# Patient Record
Sex: Male | Born: 1950 | Race: White | Hispanic: No | Marital: Married | Smoking: Never smoker
Health system: Southern US, Community
[De-identification: ages and names within clinical notes are randomized; demographics above are authoritative.]

## PROBLEM LIST (undated history)

## (undated) ENCOUNTER — Ambulatory Visit (HOSPITAL_BASED_OUTPATIENT_CLINIC_OR_DEPARTMENT_OTHER): Admission: EM

## (undated) DIAGNOSIS — F329 Major depressive disorder, single episode, unspecified: Secondary | ICD-10-CM

## (undated) DIAGNOSIS — I10 Essential (primary) hypertension: Secondary | ICD-10-CM

## (undated) DIAGNOSIS — J45909 Unspecified asthma, uncomplicated: Secondary | ICD-10-CM

## (undated) DIAGNOSIS — F32A Depression, unspecified: Secondary | ICD-10-CM

## (undated) DIAGNOSIS — K819 Cholecystitis, unspecified: Secondary | ICD-10-CM

## (undated) DIAGNOSIS — M1990 Unspecified osteoarthritis, unspecified site: Secondary | ICD-10-CM

## (undated) DIAGNOSIS — Z950 Presence of cardiac pacemaker: Secondary | ICD-10-CM

## (undated) DIAGNOSIS — K219 Gastro-esophageal reflux disease without esophagitis: Secondary | ICD-10-CM

## (undated) DIAGNOSIS — C9110 Chronic lymphocytic leukemia of B-cell type not having achieved remission: Secondary | ICD-10-CM

## (undated) DIAGNOSIS — T7840XA Allergy, unspecified, initial encounter: Secondary | ICD-10-CM

## (undated) HISTORY — PX: HERNIA REPAIR: SHX51

## (undated) HISTORY — PX: CARPAL TUNNEL RELEASE: SHX101

## (undated) HISTORY — PX: UPPER GASTROINTESTINAL ENDOSCOPY: SHX188

## (undated) HISTORY — PX: PACEMAKER INSERTION: SHX728

## (undated) HISTORY — DX: Unspecified osteoarthritis, unspecified site: M19.90

## (undated) HISTORY — PX: OTHER SURGICAL HISTORY: SHX169

## (undated) HISTORY — PX: APPENDECTOMY: SHX54

## (undated) HISTORY — PX: COLON SURGERY: SHX602

## (undated) HISTORY — DX: Unspecified asthma, uncomplicated: J45.909

## (undated) HISTORY — DX: Allergy, unspecified, initial encounter: T78.40XA

## (undated) HISTORY — PX: POLYPECTOMY: SHX149

## (undated) HISTORY — PX: COLONOSCOPY: SHX174

---

## 2000-08-05 HISTORY — PX: COLON SURGERY: SHX602

## 2002-01-03 HISTORY — PX: OTHER SURGICAL HISTORY: SHX169

## 2017-01-16 ENCOUNTER — Emergency Department (HOSPITAL_COMMUNITY)
Admission: EM | Admit: 2017-01-16 | Discharge: 2017-01-16 | Disposition: A | Discharge status: Home / Self Care | Payer: Medicare Other | Source: Home / Self Care | Attending: Emergency Medicine | Admitting: Emergency Medicine

## 2017-01-16 ENCOUNTER — Encounter (HOSPITAL_COMMUNITY): Payer: Self-pay | Admitting: Emergency Medicine

## 2017-01-16 ENCOUNTER — Emergency Department (HOSPITAL_COMMUNITY): Payer: Medicare Other

## 2017-01-16 DIAGNOSIS — Z79899 Other long term (current) drug therapy: Secondary | ICD-10-CM | POA: Insufficient documentation

## 2017-01-16 DIAGNOSIS — N179 Acute kidney failure, unspecified: Secondary | ICD-10-CM

## 2017-01-16 DIAGNOSIS — R109 Unspecified abdominal pain: Secondary | ICD-10-CM

## 2017-01-16 DIAGNOSIS — R197 Diarrhea, unspecified: Secondary | ICD-10-CM

## 2017-01-16 LAB — COMPREHENSIVE METABOLIC PANEL
ALBUMIN: 3.8 g/dL (ref 3.5–5.0)
ALT: 28 U/L (ref 17–63)
AST: 35 U/L (ref 15–41)
Alkaline Phosphatase: 74 U/L (ref 38–126)
Anion gap: 11 (ref 5–15)
BUN: 40 mg/dL — AB (ref 6–20)
CHLORIDE: 101 mmol/L (ref 101–111)
CO2: 22 mmol/L (ref 22–32)
CREATININE: 1.91 mg/dL — AB (ref 0.61–1.24)
Calcium: 9.6 mg/dL (ref 8.9–10.3)
GFR calc Af Amer: 40 mL/min — ABNORMAL LOW (ref >60)
GFR calc non Af Amer: 35 mL/min — ABNORMAL LOW (ref >60)
GLUCOSE: 115 mg/dL — AB (ref 65–99)
POTASSIUM: 3.9 mmol/L (ref 3.5–5.1)
Sodium: 134 mmol/L — ABNORMAL LOW (ref 135–145)
Total Bilirubin: 0.6 mg/dL (ref 0.3–1.2)
Total Protein: 7.2 g/dL (ref 6.5–8.1)

## 2017-01-16 LAB — CBC
HEMATOCRIT: 51.4 % (ref 39.0–52.0)
Hemoglobin: 17.6 g/dL — ABNORMAL HIGH (ref 13.0–17.0)
MCH: 33.1 pg (ref 26.0–34.0)
MCHC: 34.2 g/dL (ref 30.0–36.0)
MCV: 96.6 fL (ref 78.0–100.0)
PLATELETS: 215 10*3/uL (ref 150–400)
RBC: 5.32 MIL/uL (ref 4.22–5.81)
RDW: 12.4 % (ref 11.5–15.5)
WBC: 12 10*3/uL — ABNORMAL HIGH (ref 4.0–10.5)

## 2017-01-16 LAB — URINALYSIS, ROUTINE W REFLEX MICROSCOPIC
BACTERIA UA: NONE SEEN
Bilirubin Urine: NEGATIVE
Glucose, UA: NEGATIVE mg/dL
Ketones, ur: NEGATIVE mg/dL
LEUKOCYTES UA: NEGATIVE
Nitrite: NEGATIVE
PROTEIN: 30 mg/dL — AB
Specific Gravity, Urine: 1.041 — ABNORMAL HIGH (ref 1.005–1.030)
pH: 5 (ref 5.0–8.0)

## 2017-01-16 LAB — LIPASE, BLOOD: LIPASE: 29 U/L (ref 11–51)

## 2017-01-16 MED ORDER — GLYCOPYRROLATE 0.2 MG/ML IJ SOLN
0.2000 mg | Freq: Once | INTRAMUSCULAR | Status: AC
  Administered 2017-01-16: 0.2 mg via INTRAVENOUS
  Filled 2017-01-16: qty 1

## 2017-01-16 MED ORDER — SODIUM CHLORIDE 0.9 % IV BOLUS (SEPSIS)
2000.0000 mL | Freq: Once | INTRAVENOUS | Status: AC
  Administered 2017-01-16: 2000 mL via INTRAVENOUS

## 2017-01-16 MED ORDER — HYDROCODONE-ACETAMINOPHEN 5-325 MG PO TABS: 2.0000 | ORAL_TABLET | ORAL | 0 refills | Status: DC | PRN

## 2017-01-16 MED ORDER — ONDANSETRON HCL 4 MG/2ML IJ SOLN
4.0000 mg | Freq: Once | INTRAMUSCULAR | Status: AC
  Administered 2017-01-16: 4 mg via INTRAVENOUS
  Filled 2017-01-16: qty 2

## 2017-01-16 MED ORDER — DIPHENOXYLATE-ATROPINE 2.5-0.025 MG PO TABS: 1.0000 | ORAL_TABLET | Freq: Four times a day (QID) | ORAL | 0 refills | Status: DC | PRN

## 2017-01-16 MED ORDER — MORPHINE SULFATE (PF) 4 MG/ML IV SOLN
4.0000 mg | INTRAVENOUS | Status: DC | PRN
  Filled 2017-01-16: qty 1

## 2017-01-16 MED ORDER — IOPAMIDOL (ISOVUE-300) INJECTION 61%
INTRAVENOUS | Status: AC
  Administered 2017-01-16: 75 mL
  Filled 2017-01-16: qty 75

## 2017-01-16 MED ORDER — ONDANSETRON 4 MG PO TBDP: 4.0000 mg | ORAL_TABLET | Freq: Three times a day (TID) | ORAL | 0 refills | Status: DC | PRN

## 2017-01-16 NOTE — ED Notes (Addendum)
Pt offered the PRN morphine but patient politely declined. Pt encouraged to inform RN when and if he would like to have morphine.   Pt also informed urine and stool samples are needed. Pt provided with a urinal and specimen cup.

## 2017-01-16 NOTE — ED Triage Notes (Signed)
Pt has had diarrhea and vomitting since Sunday. Pt report 12 episodes in the last 24 hours. Pt was seen at PCP yesterday and started on bentyl and lomotil with no relief. Pt also has upper abd pain. Recent weight loss of 10 lbs.

## 2017-01-16 NOTE — ED Notes (Signed)
Education provided. AVS paper were given. Pt and wife verbalized understanding, denies need for more question.

## 2017-01-16 NOTE — ED Provider Notes (Signed)
66 yo M here with n/v and profuse diarrhea. No recent ABX use. Lab work shows pre-renal AKI and significant dehydration, and pt has been given 2L NS here in ED. He is feeling improved and has requested to go home, so plan at time of sign out is to re-assess after third L, d/c if VS remain stable. CT without acute abnormality. Pt is aware of his AKI.  On re-exam, patient is alert, oriented, in NAD. He appears well and feels improved. No further n/v/d in ED. Will d/c with supportive care, outpt follow-up for Cr re-check, and good return precautions.     Duffy Bruce, MD 01/16/17 1739

## 2017-01-16 NOTE — Discharge Instructions (Signed)
You were dehydrated enough to elevate your kidney fucntion tests. This should improve with rehydrating See your Dr. Donnetta Hutching next week for reckeck of kidney labs. Push fluids.  Lomotil for diarrhea. Vicoden for cramps or diarrhea. Recheck here with worsening diarrhea, recurring vomiting, or other changes.

## 2017-01-16 NOTE — ED Provider Notes (Signed)
[facility] DEPT Provider Note   CSN: [id number] Arrival date & time: 01/16/17  1139     History   Chief Complaint Chief Complaint  Patient presents with  . Diarrhea  . Emesis    HPI Jonathon Parker is a 66 y.o. male. Patient presents with vomiting and diarrhea since Sunday,(4 days). Started with some diarrhea and abdominal cramping on Sunday and muscle aches". May have had low-grade fever day one and 2. Some vomiting yesterday but no other episodes of vomiting. He has had profuse diarrhea for 3 days. Seen by primary care him placed on Lomotil but has continued diarrhea. Feels weak and achy all over today and presents here.   No recent antibiotic use. No travel. No dysentery stools, I.e. No blood. Heme-negative nonbilious emesis. No dysuria. Continues to make urine. No ill exposures.  HPI  No past medical history on file.  There are no active problems to display for this patient.   Past Surgical History:  Procedure Laterality Date  . COLON SURGERY    . HERNIA REPAIR         Home Medications    Prior to Admission medications   Medication Sig Start Date End Date Taking? Authorizing Provider  buPROPion (WELLBUTRIN XL) 300 MG 24 hr tablet Take 300 mg by mouth at bedtime.   Yes [provider]  esomeprazole (NEXIUM) 40 MG capsule Take 40 mg by mouth daily at 12 noon.   Yes [provider]  ibuprofen (ADVIL,MOTRIN) 200 MG tablet Take 800 mg by mouth every 6 (six) hours as needed for fever or mild pain.   Yes [provider]  traZODone (DESYREL) 100 MG tablet Take 100 mg by mouth at bedtime.   Yes [provider]  diphenoxylate-atropine (LOMOTIL) 2.5-0.025 MG tablet Take 1 tablet by mouth 4 (four) times daily as needed for diarrhea or loose stools. 01/16/17   Tanna Furry, MD  HYDROcodone-acetaminophen (NORCO/VICODIN) 5-325 MG tablet Take 2 tablets by mouth every 4 (four) hours as needed. 01/16/17   Tanna Furry, MD  ondansetron (ZOFRAN ODT) 4  MG disintegrating tablet Take 1 tablet (4 mg total) by mouth every 8 (eight) hours as needed for nausea. 01/16/17   Tanna Furry, MD    Family History No family history on file.  Social History Social History  Substance Use Topics  . Smoking status: Not on file  . Smokeless tobacco: Not on file  . Alcohol use Not on file     Allergies   Patient has no known allergies.   Review of Systems Review of Systems  Constitutional: Negative for appetite change, chills, diaphoresis, fatigue and fever.  HENT: Negative for mouth sores, sore throat and trouble swallowing.   Eyes: Negative for visual disturbance.  Respiratory: Negative for cough, chest tightness, shortness of breath and wheezing.   Cardiovascular: Negative for chest pain.  Gastrointestinal: Positive for abdominal pain, diarrhea, nausea and vomiting. Negative for abdominal distention.  Endocrine: Negative for polydipsia, polyphagia and polyuria.  Genitourinary: Negative for dysuria, frequency and hematuria.  Musculoskeletal: Negative for gait problem.  Skin: Negative for color change, pallor and rash.  Neurological: Negative for dizziness, syncope, light-headedness and headaches.  Hematological: Does not bruise/bleed easily.  Psychiatric/Behavioral: Negative for behavioral problems and confusion.     Physical Exam Updated Vital Signs BP 125/84   Pulse 71   Temp 97.6 F (36.4 C) (Oral)   Resp 16   Ht 6' (1.829 m)   Wt 78.9 kg (174 lb)   SpO2  95%   BMI 23.60 kg/m   Physical Exam  Constitutional: He is oriented to person, place, and time. He appears well-developed and well-nourished. No distress.  HENT:  Head: Normocephalic.  Eyes: Conjunctivae are normal. Pupils are equal, round, and reactive to light. No scleral icterus.  Neck: Normal range of motion. Neck supple. No thyromegaly present.  Cardiovascular: Normal rate and regular rhythm.  Exam reveals no gallop and no friction rub.   No murmur  heard. Pulmonary/Chest: Effort normal and breath sounds normal. No respiratory distress. He has no wheezes. He has no rales.  Abdominal: Soft. Bowel sounds are normal. He exhibits no distension. There is no tenderness. There is no rebound.  Soft benign abdomen. Hyperactive bowel sounds. No guarding or rebound. No localized tenderness.  Musculoskeletal: Normal range of motion.  Neurological: He is alert and oriented to person, place, and time.  Skin: Skin is warm and dry. No rash noted.  Psychiatric: He has a normal mood and affect. His behavior is normal.     ED Treatments / Results  Labs (all labs ordered are listed, but only abnormal results are displayed) Labs Reviewed  COMPREHENSIVE METABOLIC PANEL - Abnormal; Notable for the following:       Result Value   Sodium 134 (*)    Glucose, Bld 115 (*)    BUN 40 (*)    Creatinine, Ser 1.91 (*)    GFR calc non Af Amer 35 (*)    GFR calc Af Amer 40 (*)    All other components within normal limits  CBC - Abnormal; Notable for the following:    WBC 12.0 (*)    Hemoglobin 17.6 (*)    All other components within normal limits  URINALYSIS, ROUTINE W REFLEX MICROSCOPIC - Abnormal; Notable for the following:    APPearance HAZY (*)    Specific Gravity, Urine 1.041 (*)    Hgb urine dipstick SMALL (*)    Protein, ur 30 (*)    Squamous Epithelial / LPF 0-5 (*)    All other components within normal limits  C DIFFICILE QUICK SCREEN W PCR REFLEX  LIPASE, BLOOD    EKG  EKG Interpretation None       Radiology Ct Abdomen Pelvis W Contrast  Result Date: 01/16/2017 CLINICAL DATA:  Nausea, vomiting and diarrhea. Prior colon surgery and hernia repair. EXAM: CT ABDOMEN AND PELVIS WITH CONTRAST TECHNIQUE: Multidetector CT imaging of the abdomen and pelvis was performed using the standard protocol following bolus administration of intravenous contrast. CONTRAST:  80mL ISOVUE-300 IOPAMIDOL (ISOVUE-300) INJECTION 61% COMPARISON:  None. FINDINGS:  Lower chest: Normal size cardiac chambers with bibasilar dependent atelectasis. Hepatobiliary: No focal liver abnormality is seen. No gallstones, gallbladder wall thickening, or biliary dilatation. Pancreas: Unremarkable. No pancreatic ductal dilatation or surrounding inflammatory changes. Spleen: Normal in size without focal abnormality. Adrenals/Urinary Tract: Adrenal glands are unremarkable. Kidneys are normal, without renal calculi, focal lesion, or hydronephrosis. Bladder is unremarkable. Stomach/Bowel: The stomach is contracted in appearance. There is normal small bowel rotation without inflammation or obstruction. Sutures are seen at the base of the cecum consistent prior appendectomy. The appendix not visualized. Move liquid stool noted within the colon which may reflect diarrheal disease. Sutures are noted along the distal rectum. No large bowel obstruction is noted. Vascular/Lymphatic: No significant vascular findings are present. No enlarged abdominal or pelvic lymph nodes. Reproductive: Prostate is unremarkable. Other: Suture material along the lower ventral pelvis are extending to the pubic symphysis from presumed prior hernia repair  possibly a sports hernia given location. Musculoskeletal: Degenerative disc disease L5-S1. No acute nor suspicious osseous lesions. IMPRESSION: 1. No bowel obstruction or acute inflammation. Liquid stool in the rectum may reflect diarrheal disease. 2. Degenerative disc disease L5-S1. Electronically Signed   By: Ashley Royalty M.D.   On: 01/16/2017 15:16    Procedures Procedures (including critical care time)  Medications Ordered in ED Medications  morphine 4 MG/ML injection 4 mg (not administered)  sodium chloride 0.9 % bolus 2,000 mL (0 mLs Intravenous Stopping Infusion hung by another clincian 01/16/17 1625)  ondansetron (ZOFRAN) injection 4 mg (4 mg Intravenous Given 01/16/17 1342)  glycopyrrolate (ROBINUL) injection 0.2 mg (0.2 mg Intravenous Given 01/16/17 1344)   iopamidol (ISOVUE-300) 61 % injection (75 mLs  Contrast Given 01/16/17 1457)     Initial Impression / Assessment and Plan / ED Course  I have reviewed the triage vital signs and the nursing notes.  Pertinent labs & imaging results that were available during my care of the patient were reviewed by me and considered in my medical decision making (see chart for details).     Mild AK I1.9. After fluids and antiemetics, Lomotil, and morphine he is feeling better. He has had no diarrhea here in 3 hours. I discussed his mild AK I with him. He is feeling better taking by mouth fluids. I think he would be appropriate for outpatient treatment with close follow-up with his primary care recheck creatinine in the next several days. We'll give him a limited number of 10 Vicodin use for cramps and pain and diarrhea. Also Zofran and Lomotil as needed. If he produces a stool while here we will collected and sent for C. Difficile, although low suspicion as patient not on  And hepatic or any recent hospitalizations.  Final Clinical Impressions(s) / ED Diagnoses   Final diagnoses:  Diarrhea, unspecified type  Acute kidney injury (HCC)    New Prescriptions New Prescriptions   DIPHENOXYLATE-ATROPINE (LOMOTIL) 2.5-0.025 MG TABLET    Take 1 tablet by mouth 4 (four) times daily as needed for diarrhea or loose stools.   HYDROCODONE-ACETAMINOPHEN (NORCO/VICODIN) 5-325 MG TABLET    Take 2 tablets by mouth every 4 (four) hours as needed.   ONDANSETRON (ZOFRAN ODT) 4 MG DISINTEGRATING TABLET    Take 1 tablet (4 mg total) by mouth every 8 (eight) hours as needed for nausea.     Tanna Furry, MD 01/16/17 [phone]

## 2017-01-16 NOTE — ED Notes (Signed)
Pt alert oriented, denies pain. Family at the bedside. Education about enteric percussion, dehydration and electrolyte imbalance were given, verbalized understanding

## 2017-01-19 ENCOUNTER — Encounter (HOSPITAL_COMMUNITY): Payer: Self-pay | Admitting: Emergency Medicine

## 2017-01-19 ENCOUNTER — Inpatient Hospital Stay (HOSPITAL_COMMUNITY)
Admission: EM | Admit: 2017-01-19 | Discharge: 2017-01-23 | DRG: 871 | Disposition: A | Discharge status: Home / Self Care | Payer: Medicare Other | Attending: Internal Medicine | Admitting: Internal Medicine

## 2017-01-19 ENCOUNTER — Emergency Department (HOSPITAL_COMMUNITY): Payer: Medicare Other

## 2017-01-19 DIAGNOSIS — A09 Infectious gastroenteritis and colitis, unspecified: Secondary | ICD-10-CM | POA: Diagnosis not present

## 2017-01-19 DIAGNOSIS — N179 Acute kidney failure, unspecified: Secondary | ICD-10-CM | POA: Diagnosis present

## 2017-01-19 DIAGNOSIS — K219 Gastro-esophageal reflux disease without esophagitis: Secondary | ICD-10-CM | POA: Diagnosis present

## 2017-01-19 DIAGNOSIS — A419 Sepsis, unspecified organism: Principal | ICD-10-CM | POA: Diagnosis present

## 2017-01-19 DIAGNOSIS — A045 Campylobacter enteritis: Secondary | ICD-10-CM | POA: Diagnosis present

## 2017-01-19 DIAGNOSIS — R945 Abnormal results of liver function studies: Secondary | ICD-10-CM

## 2017-01-19 DIAGNOSIS — D1803 Hemangioma of intra-abdominal structures: Secondary | ICD-10-CM | POA: Diagnosis present

## 2017-01-19 DIAGNOSIS — R6510 Systemic inflammatory response syndrome (SIRS) of non-infectious origin without acute organ dysfunction: Secondary | ICD-10-CM

## 2017-01-19 DIAGNOSIS — K529 Noninfective gastroenteritis and colitis, unspecified: Secondary | ICD-10-CM | POA: Diagnosis present

## 2017-01-19 DIAGNOSIS — Z9049 Acquired absence of other specified parts of digestive tract: Secondary | ICD-10-CM

## 2017-01-19 DIAGNOSIS — R7989 Other specified abnormal findings of blood chemistry: Secondary | ICD-10-CM | POA: Diagnosis present

## 2017-01-19 DIAGNOSIS — K8020 Calculus of gallbladder without cholecystitis without obstruction: Secondary | ICD-10-CM | POA: Diagnosis present

## 2017-01-19 DIAGNOSIS — Z79899 Other long term (current) drug therapy: Secondary | ICD-10-CM | POA: Diagnosis not present

## 2017-01-19 DIAGNOSIS — K859 Acute pancreatitis without necrosis or infection, unspecified: Secondary | ICD-10-CM | POA: Diagnosis present

## 2017-01-19 DIAGNOSIS — E876 Hypokalemia: Secondary | ICD-10-CM | POA: Diagnosis present

## 2017-01-19 DIAGNOSIS — B9623 Unspecified Shiga toxin-producing Escherichia coli [E. coli] (STEC) as the cause of diseases classified elsewhere: Secondary | ICD-10-CM | POA: Diagnosis present

## 2017-01-19 DIAGNOSIS — E860 Dehydration: Secondary | ICD-10-CM | POA: Diagnosis present

## 2017-01-19 DIAGNOSIS — K851 Biliary acute pancreatitis without necrosis or infection: Secondary | ICD-10-CM | POA: Diagnosis not present

## 2017-01-19 DIAGNOSIS — R748 Abnormal levels of other serum enzymes: Secondary | ICD-10-CM | POA: Diagnosis present

## 2017-01-19 DIAGNOSIS — F329 Major depressive disorder, single episode, unspecified: Secondary | ICD-10-CM | POA: Diagnosis present

## 2017-01-19 DIAGNOSIS — R109 Unspecified abdominal pain: Secondary | ICD-10-CM

## 2017-01-19 DIAGNOSIS — A498 Other bacterial infections of unspecified site: Secondary | ICD-10-CM

## 2017-01-19 DIAGNOSIS — F32A Depression, unspecified: Secondary | ICD-10-CM | POA: Diagnosis present

## 2017-01-19 DIAGNOSIS — A4151 Sepsis due to Escherichia coli [E. coli]: Secondary | ICD-10-CM | POA: Diagnosis not present

## 2017-01-19 HISTORY — DX: Depression, unspecified: F32.A

## 2017-01-19 HISTORY — DX: Major depressive disorder, single episode, unspecified: F32.9

## 2017-01-19 LAB — COMPREHENSIVE METABOLIC PANEL
ALBUMIN: 3.1 g/dL — AB (ref 3.5–5.0)
ALK PHOS: 164 U/L — AB (ref 38–126)
ALT: 54 U/L (ref 17–63)
AST: 35 U/L (ref 15–41)
Anion gap: 11 (ref 5–15)
BUN: 17 mg/dL (ref 6–20)
CALCIUM: 9.3 mg/dL (ref 8.9–10.3)
CHLORIDE: 98 mmol/L — AB (ref 101–111)
CO2: 25 mmol/L (ref 22–32)
CREATININE: 1.13 mg/dL (ref 0.61–1.24)
GFR calc Af Amer: >60 mL/min (ref >60)
GFR calc non Af Amer: >60 mL/min (ref >60)
GLUCOSE: 83 mg/dL (ref 65–99)
Potassium: 3.5 mmol/L (ref 3.5–5.1)
SODIUM: 134 mmol/L — AB (ref 135–145)
Total Bilirubin: 1.7 mg/dL — ABNORMAL HIGH (ref 0.3–1.2)
Total Protein: 6.8 g/dL (ref 6.5–8.1)

## 2017-01-19 LAB — CBC
HCT: 46.4 % (ref 39.0–52.0)
HEMATOCRIT: 43.6 % (ref 39.0–52.0)
HEMOGLOBIN: 15.4 g/dL (ref 13.0–17.0)
Hemoglobin: 15.5 g/dL (ref 13.0–17.0)
MCH: 32.1 pg (ref 26.0–34.0)
MCH: 33.6 pg (ref 26.0–34.0)
MCHC: 33.4 g/dL (ref 30.0–36.0)
MCHC: 35.3 g/dL (ref 30.0–36.0)
MCV: 96.1 fL (ref 78.0–100.0)
MCV: 95.2 fL (ref 78.0–100.0)
PLATELETS: 298 10*3/uL (ref 150–400)
Platelets: 324 10*3/uL (ref 150–400)
RBC: 4.83 MIL/uL (ref 4.22–5.81)
RBC: 4.58 MIL/uL (ref 4.22–5.81)
RDW: 12.5 % (ref 11.5–15.5)
RDW: 12.3 % (ref 11.5–15.5)
WBC: 19.5 10*3/uL — ABNORMAL HIGH (ref 4.0–10.5)
WBC: 23.1 10*3/uL — ABNORMAL HIGH (ref 4.0–10.5)

## 2017-01-19 LAB — URINALYSIS, ROUTINE W REFLEX MICROSCOPIC
BILIRUBIN URINE: NEGATIVE
Bacteria, UA: NONE SEEN
Glucose, UA: NEGATIVE mg/dL
KETONES UR: 5 mg/dL — AB
Leukocytes, UA: NEGATIVE
Nitrite: NEGATIVE
PH: 5 (ref 5.0–8.0)
Protein, ur: 100 mg/dL — AB
SPECIFIC GRAVITY, URINE: 1.027 (ref 1.005–1.030)

## 2017-01-19 LAB — CREATININE, SERUM
CREATININE: 1.12 mg/dL (ref 0.61–1.24)
GFR calc Af Amer: >60 mL/min (ref >60)

## 2017-01-19 LAB — LIPASE, BLOOD: Lipase: 84 U/L — ABNORMAL HIGH (ref 11–51)

## 2017-01-19 MED ORDER — ONDANSETRON HCL 4 MG/2ML IJ SOLN
4.0000 mg | Freq: Once | INTRAMUSCULAR | Status: AC
  Administered 2017-01-19: 4 mg via INTRAVENOUS
  Filled 2017-01-19: qty 2

## 2017-01-19 MED ORDER — SODIUM CHLORIDE 0.9 % IV BOLUS (SEPSIS)
1000.0000 mL | Freq: Once | INTRAVENOUS | Status: AC
  Administered 2017-01-19: 1000 mL via INTRAVENOUS

## 2017-01-19 MED ORDER — VANCOMYCIN 50 MG/ML ORAL SOLUTION
125.0000 mg | Freq: Four times a day (QID) | ORAL | Status: DC
  Administered 2017-01-19 – 2017-01-20 (×2): 125 mg via ORAL
  Filled 2017-01-19 (×6): qty 2.5

## 2017-01-19 MED ORDER — ENOXAPARIN SODIUM 40 MG/0.4ML ~~LOC~~ SOLN
40.0000 mg | SUBCUTANEOUS | Status: DC
  Administered 2017-01-19 – 2017-01-22 (×4): 40 mg via SUBCUTANEOUS
  Filled 2017-01-19 (×4): qty 0.4

## 2017-01-19 MED ORDER — PANTOPRAZOLE SODIUM 40 MG IV SOLR
40.0000 mg | INTRAVENOUS | Status: DC
  Administered 2017-01-19: 40 mg via INTRAVENOUS
  Filled 2017-01-19: qty 40

## 2017-01-19 MED ORDER — BUPROPION HCL ER (XL) 150 MG PO TB24
300.0000 mg | ORAL_TABLET | Freq: Every day | ORAL | Status: DC
  Administered 2017-01-20 – 2017-01-22 (×3): 300 mg via ORAL
  Filled 2017-01-19 (×3): qty 2

## 2017-01-19 MED ORDER — ONDANSETRON HCL 4 MG PO TABS: 4.0000 mg | ORAL_TABLET | Freq: Four times a day (QID) | ORAL | Status: DC | PRN

## 2017-01-19 MED ORDER — MORPHINE SULFATE (PF) 4 MG/ML IV SOLN
4.0000 mg | Freq: Once | INTRAVENOUS | Status: AC
  Administered 2017-01-19: 4 mg via INTRAVENOUS
  Filled 2017-01-19: qty 1

## 2017-01-19 MED ORDER — OXYCODONE HCL 5 MG PO TABS: 5.0000 mg | ORAL_TABLET | ORAL | Status: DC | PRN

## 2017-01-19 MED ORDER — ACETAMINOPHEN 325 MG PO TABS: 650.0000 mg | ORAL_TABLET | Freq: Four times a day (QID) | ORAL | Status: DC | PRN

## 2017-01-19 MED ORDER — TRAZODONE HCL 100 MG PO TABS
100.0000 mg | ORAL_TABLET | Freq: Every day | ORAL | Status: DC
  Administered 2017-01-19 – 2017-01-22 (×4): 100 mg via ORAL
  Filled 2017-01-19 (×4): qty 1

## 2017-01-19 MED ORDER — SODIUM CHLORIDE 0.9 % IV SOLN
INTRAVENOUS | Status: DC
  Administered 2017-01-19 – 2017-01-21 (×6): via INTRAVENOUS

## 2017-01-19 MED ORDER — MORPHINE SULFATE (PF) 2 MG/ML IV SOLN: 1.0000 mg | INTRAVENOUS | Status: DC | PRN

## 2017-01-19 MED ORDER — ALBUTEROL SULFATE (2.5 MG/3ML) 0.083% IN NEBU: 2.5000 mg | INHALATION_SOLUTION | RESPIRATORY_TRACT | Status: DC | PRN

## 2017-01-19 MED ORDER — ACETAMINOPHEN 650 MG RE SUPP: 650.0000 mg | Freq: Four times a day (QID) | RECTAL | Status: DC | PRN

## 2017-01-19 MED ORDER — ONDANSETRON HCL 4 MG/2ML IJ SOLN: 4.0000 mg | Freq: Four times a day (QID) | INTRAMUSCULAR | Status: DC | PRN

## 2017-01-19 NOTE — ED Notes (Signed)
Patient transported to Ultrasound 

## 2017-01-19 NOTE — ED Provider Notes (Signed)
[facility] DEPT Provider Note   CSN: [id number] Arrival date & time: 01/19/17  1204     History   Chief Complaint Chief Complaint  Patient presents with  . Abdominal Pain  . Dehydration    HPI Jonathon Parker is a 66 y.o. male who presents today with persistent generalized abdominal pain, nausea, generalized fatigue. Patient was seen here on 01/16/17 for evaluation of abdominal pain. CT at that time was unremarkable. He did have slight bump in his BUN/creatinine was 5 follow-up with his primary care doctor for reevaluation. He was discharged home with supportive care and plans follow-up with primary care. He returns today because he feels like his symptoms have not been improving. He states that his abdominal pain has improved but is still present. He states that it is like a intermittent sharp ache and is worsened after eating. He reports that he is still very nauseous and has had decreased PO. He reports that he feels "dehydrated and just very fatigued."  He he reports that he had about 3 days of continuous diarrhea where he was having multiple episodes per day. He said it was very loose and "like green snot." The presence of blood in stool. He states that he has not had any more diarrhea since 01/17/17. He states the last time he was taking antibiotics was in March. He has not taken any since. Patient denies any dysuria, hematuria vomiting, chest pain, difficulty breathing, fever.  The history is provided by the patient.    History reviewed. No pertinent past medical history.  Patient Active Problem List   Diagnosis Date Noted  . Gastroenteritis 01/19/2017  . SIRS (systemic inflammatory response syndrome) ([place]) 01/19/2017  . GERD (gastroesophageal reflux disease) 01/19/2017  . Depression 01/19/2017    Past Surgical History:  Procedure Laterality Date  . COLON SURGERY    . HERNIA REPAIR         Home Medications    Prior to Admission medications   Medication Sig Start  Date End Date Taking? Authorizing Provider  buPROPion (WELLBUTRIN XL) 300 MG 24 hr tablet Take 300 mg by mouth at bedtime.   Yes [provider]  diphenoxylate-atropine (LOMOTIL) 2.5-0.025 MG tablet Take 1 tablet by mouth 4 (four) times daily as needed for diarrhea or loose stools. 01/16/17  Yes Tanna Furry, MD  esomeprazole (NEXIUM) 40 MG capsule Take 40 mg by mouth daily at 12 noon.   Yes [provider]  HYDROcodone-acetaminophen (NORCO/VICODIN) 5-325 MG tablet Take 2 tablets by mouth every 4 (four) hours as needed. 01/16/17  Yes Tanna Furry, MD  ibuprofen (ADVIL,MOTRIN) 200 MG tablet Take 800 mg by mouth every 6 (six) hours as needed for fever or mild pain.   Yes [provider]  ondansetron (ZOFRAN ODT) 4 MG disintegrating tablet Take 1 tablet (4 mg total) by mouth every 8 (eight) hours as needed for nausea. 01/16/17  Yes Tanna Furry, MD  traZODone (DESYREL) 100 MG tablet Take 100 mg by mouth at bedtime.   Yes [provider]    Family History No family history on file.  Social History Social History  Substance Use Topics  . Smoking status: Never Smoker  . Smokeless tobacco: Never Used  . Alcohol use Yes     Allergies   Patient has no known allergies.   Review of Systems Review of Systems  Constitutional: Positive for appetite change and fatigue. Negative for chills and fever.  Respiratory: Negative for cough and shortness of breath.  Cardiovascular: Negative for chest pain.  Gastrointestinal: Positive for abdominal pain, diarrhea (Resolved) and nausea. Negative for blood in stool and vomiting.  Genitourinary: Negative for dysuria and hematuria.  Musculoskeletal: Positive for myalgias. Negative for back pain.  Skin: Negative for rash.  Neurological: Positive for weakness (Generalized). Negative for dizziness, numbness and headaches.  All other systems reviewed and are negative.    Physical Exam Updated Vital Signs BP 119/71   Pulse 77    Temp 98.1 F (36.7 C) (Oral)   Resp 15   Ht 6' (1.829 m)   Wt 78.9 kg (174 lb)   SpO2 97%   BMI 23.60 kg/m   Physical Exam  Constitutional: He is oriented to person, place, and time. He appears well-developed and well-nourished.  Appears uncomfortable but no acute distress.  HENT:  Head: Normocephalic and atraumatic.  Mouth/Throat: Uvula is midline and oropharynx is clear and moist. Mucous membranes are dry.  Eyes: Conjunctivae, EOM and lids are normal. Pupils are equal, round, and reactive to light.  Neck: Full passive range of motion without pain.  Cardiovascular: Normal rate, regular rhythm, normal heart sounds and normal pulses.  Exam reveals no gallop and no friction rub.   No murmur heard. Pulses:      Radial pulses are 2+ on the right side, and 2+ on the left side.  Pulmonary/Chest: Effort normal and breath sounds normal.  Abdominal: Soft. Normal appearance. Bowel sounds are decreased. There is tenderness in the left upper quadrant and left lower quadrant. There is no rigidity, no guarding, no CVA tenderness and negative Murphy's sign.  Musculoskeletal: Normal range of motion.  Neurological: He is alert and oriented to person, place, and time.  Skin: Skin is warm and dry. Capillary refill takes less than 2 seconds.  Psychiatric: He has a normal mood and affect. His speech is normal.  Nursing note and vitals reviewed.    ED Treatments / Results  Labs (all labs ordered are listed, but only abnormal results are displayed) Labs Reviewed  LIPASE, BLOOD - Abnormal; Notable for the following:       Result Value   Lipase 84 (*)    All other components within normal limits  COMPREHENSIVE METABOLIC PANEL - Abnormal; Notable for the following:    Sodium 134 (*)    Chloride 98 (*)    Albumin 3.1 (*)    Alkaline Phosphatase 164 (*)    Total Bilirubin 1.7 (*)    All other components within normal limits  CBC - Abnormal; Notable for the following:    WBC 19.5 (*)    All  other components within normal limits  URINALYSIS, ROUTINE W REFLEX MICROSCOPIC - Abnormal; Notable for the following:    Color, Urine AMBER (*)    APPearance HAZY (*)    Hgb urine dipstick SMALL (*)    Ketones, ur 5 (*)    Protein, ur 100 (*)    Squamous Epithelial / LPF 0-5 (*)    All other components within normal limits    EKG  EKG Interpretation None       Radiology US Abdomen Limited Ruq  Result Date: 01/19/2017 CLINICAL DATA:  Abdominal pain. EXAM: ULTRASOUND ABDOMEN LIMITED RIGHT UPPER QUADRANT COMPARISON:  CT scans August 2012 and January 16, 2017. Ultrasounds from March 2013 and September 2016 FINDINGS: Gallbladder: Stones and sludge are seen in the gallbladder with no wall thickening, pericholecystic fluid, or Murphy's sign. Common bile duct: Diameter: 6 mm Liver: An echogenic rounded mass is  seen in the liver measuring up to 1.9 cm. No other abnormalities. IMPRESSION: 1. Stones and sludge are seen in the gallbladder with no wall thickening, pericholecystic fluid, or Murphy's sign. 2. A 1.9 cm mass is seen in the right hepatic lobe. This was not seen on previous studies. The finding is nonspecific but could represent a hemangioma. An MRI could further assess to exclude other possibilities since this abnormality was not seen previously. 3. The common bile duct is at the upper limits of normal. Recommend correlation with labs. Electronically Signed   By: Dorise Bullion III M.D   On: 01/19/2017 16:21    Procedures Procedures (including critical care time)  Medications Ordered in ED Medications  sodium chloride 0.9 % bolus 1,000 mL (not administered)  sodium chloride 0.9 % bolus 1,000 mL (1,000 mLs Intravenous New Bag/Given 01/19/17 1431)  ondansetron (ZOFRAN) injection 4 mg (4 mg Intravenous Given 01/19/17 1430)  morphine 4 MG/ML injection 4 mg (4 mg Intravenous Given 01/19/17 1501)     Initial Impression / Assessment and Plan / ED Course  I have reviewed the triage vital  signs and the nursing notes.  Pertinent labs & imaging results that were available during my care of the patient were reviewed by me and considered in my medical decision making (see chart for details).     66 yo M who presents to the Emergency Department with persistent abdominal pain, generalized fatigue and nausea. Patient recently seen here on 01/16/17 for abdominal pain. At that time CT abdomen was unremarkable for any acute abnormality. Returns today because he is having persistent symptoms. Patient also states he feels like he is dehydrated. Concern for infectious etiology versus dehydration versus worsening AKI vs pancreatitis. Labs ordered at triage, including CBC, CMP, lipase, UA. Analgesics provided in the department. IVF order for fluid resuscitation.  Labs reviewed. CBC shows elevated white blood cell count at 19.5, which is elevated from his previous one on 6/14 which was 12.0. Lipase is elevated today at 84, compared to 29 measured 2 days ago. CMP shows elevation in alkaline phosphatase and bili, which was not present in previous ED visit. AKI is improved. Given findings on labs and concern for consistent pain. Will plan to right upper quadrant ultrasound to evaluate for concerns for gallbladder induced pancreatitis. UA is pending.  Ultrasound reviewed. There is presence of stones and sludge seen in the gallbladder. The common duct is at the upper limits of normal. Given findings, concern that the pink otitis maybe biliary related. At this time, patient likely benefit from admission  for further evaluation.  Consult hospitalist place.  Discussed with hospitalist. Will plan to admit for pink otitis and further evaluation.   Final Clinical Impressions(s) / ED Diagnoses   Final diagnoses:  Abdominal pain  Acute biliary pancreatitis, unspecified complication status    New Prescriptions New Prescriptions   No medications on file     Desma Mcgregor 01/19/17 1711      Macarthur Critchley, MD 01/22/17 [phone]

## 2017-01-19 NOTE — ED Triage Notes (Signed)
Pt.omach pain with nausea and diarrhea, that has stopped, but now I think Im just dehydrated.

## 2017-01-19 NOTE — H&P (Signed)
HISTORY AND PHYSICAL       PATIENT DETAILS Name: Jonathon Parker Age: 66 y.o. Sex: male Date of Birth: [birth year] Admit Date: 01/19/2017 [id number], Lora Havens, MD   Patient coming from: [place]:  Dry heaving, diffuse abdominal pain, watery diarrhea-for 5-7 days  HPI: Jonathon Parker is a 66 y.o. male with medical history significant of GERD, depression who presented to the ED for evaluation of the above noted complaints. Per history obtained, approximately 1 week back, patient started having fever, after 2 days the fever resolved, patient then started having profuse loose watery diarrhea-up to 12 episodes during the day. He was seen in the emergency room on 6/14 for this issue, and given IV fluids, supportive care and discharged home. Unfortunately he continues to feel very fatigued, he has started to develop diffuse abdominal pain and diffuse myalgias as well. His diarrhea continues, he has had approximately 3 loose stools earlier today, but none since then. He also complains of mostly nausea and dry heaving over the past few days and has not been able to drink/eat.  Due to persistence of the above noted symptoms, he presented back to the emergency room, where he was found to have a minimally elevated lipase, a RUQ ultrasound demonstrated stones/sludge in the gallbladder, a 1.9 cm mass was also seen in the right lower lobe, per ultrasound report this is nonspecific and could represent a hemangioma.  Patient was given IV fluids and other supportive measures, and the hospitalist service was asked to admit this patient for further evaluation and treatment.  ED Course: See above.  Note: Lives at: Home Mobility: Independent Chronic Indwelling Foley:no   REVIEW OF SYSTEMS:  Constitutional:   No  weight loss, night sweats,  Fevers, chills, fatigue.  HEENT:    No headaches, Dysphagia,Tooth/dental problems,Sore throat  Cardio-vascular: No chest  pain,Orthopnea, PND,lower extremity edema, anasarca, palpitations  GI:  No heartburn  Resp: No shortness of breath, cough, hemoptysis,plueritic chest pain.   Skin:  No rash or lesions.  GU:  No dysuria, change in color of urine, no urgency or frequency.  No flank pain.  Musculoskeletal: No joint pain or swelling.  No decreased range of motion.  No back pain.  Endocrine: No heat intolerance, no cold intolerance, no polyuria, no polydipsia  Psych: No change in mood or affect. No depression or anxiety.  No memory loss.   ALLERGIES:  No Known Allergies  PAST MEDICAL HISTORY: GERD Depression  PAST SURGICAL HISTORY: Past Surgical History:  Procedure Laterality Date  . COLON SURGERY    . HERNIA REPAIR      MEDICATIONS AT HOME: Prior to Admission medications   Medication Sig Start Date End Date Taking? Authorizing Provider  buPROPion (WELLBUTRIN XL) 300 MG 24 hr tablet Take 300 mg by mouth at bedtime.   Yes [provider]  diphenoxylate-atropine (LOMOTIL) 2.5-0.025 MG tablet Take 1 tablet by mouth 4 (four) times daily as needed for diarrhea or loose stools. 01/16/17  Yes Tanna Furry, MD  esomeprazole (NEXIUM) 40 MG capsule Take 40 mg by mouth daily at 12 noon.   Yes [provider]  HYDROcodone-acetaminophen (NORCO/VICODIN) 5-325 MG tablet Take 2 tablets by mouth every 4 (four) hours as needed. 01/16/17  Yes Tanna Furry, MD  ibuprofen (ADVIL,MOTRIN) 200 MG tablet Take 800 mg by mouth every 6 (six) hours as needed for fever or mild pain.   Yes [provider]  ondansetron (ZOFRAN ODT) 4  MG disintegrating tablet Take 1 tablet (4 mg total) by mouth every 8 (eight) hours as needed for nausea. 01/16/17  Yes Tanna Furry, MD  traZODone (DESYREL) 100 MG tablet Take 100 mg by mouth at bedtime.   Yes [provider]    FAMILY HISTORY: No family history of GI malignancy  SOCIAL HISTORY:  reports that he has never smoked. He has never used smokeless  tobacco. He reports that he drinks alcohol. He reports that he does not use drugs.  PHYSICAL EXAM: Blood pressure 119/71, pulse 77, temperature 98.1 F (36.7 C), temperature source Oral, resp. rate 15, height 6' (1.829 m), weight 78.9 kg (174 lb), SpO2 97 %.  General appearance :Awake, alert, not in any distress. Speech Clear. Not toxic Looking Eyes:, pupils equally reactive to light and accomodation,no scleral icterus.Pink conjunctiva HEENT: Atraumatic and Normocephalic Neck: supple, no JVD. No cervical lymphadenopathy. No thyromegaly Resp:Good air entry bilaterally, no added sounds  CVS: S1 S2 regular, no murmurs.  GI: Bowel sounds present, nontender on my exam (but given morphine prior to my arrival). Some mild guarding but benign abdominal exam. Extremities: B/L Lower Ext shows no edema, both legs are warm to touch Neurology:  speech clear,Non focal, sensation is grossly intact. Psychiatric: Normal judgment and insight. Alert and oriented x 3. Normal mood. Musculoskeletal:gait appears to be normal.No digital cyanosis Skin:No Rash, warm and dry Wounds:N/A  LABS ON ADMISSION:  I have personally reviewed following labs and imaging studies  CBC:  Recent Labs Lab 01/16/17 1213 01/19/17 1210  WBC 12.0* 19.5*  HGB 17.6* 15.5  HCT 51.4 46.4  MCV 96.6 96.1  PLT 215 659    Basic Metabolic Panel:  Recent Labs Lab 01/16/17 1213 01/19/17 1210  NA 134* 134*  K 3.9 3.5  CL 101 98*  CO2 22 25  GLUCOSE 115* 83  BUN 40* 17  CREATININE 1.91* 1.13  CALCIUM 9.6 9.3    GFR: Estimated Creatinine Clearance: 70.6 mL/min (by C-G formula based on SCr of 1.13 mg/dL).  Liver Function Tests:  Recent Labs Lab 01/16/17 1213 01/19/17 1210  AST 35 35  ALT 28 54  ALKPHOS 74 164*  BILITOT 0.6 1.7*  PROT 7.2 6.8  ALBUMIN 3.8 3.1*    Recent Labs Lab 01/16/17 1213 01/19/17 1210  LIPASE 29 84*   No results for input(s): AMMONIA in the last 168 hours.  Coagulation Profile: No  results for input(s): INR, PROTIME in the last 168 hours.  Cardiac Enzymes: No results for input(s): CKTOTAL, CKMB, CKMBINDEX, TROPONINI in the last 168 hours.  BNP (last 3 results) No results for input(s): PROBNP in the last 8760 hours.  HbA1C: No results for input(s): HGBA1C in the last 72 hours.  CBG: No results for input(s): GLUCAP in the last 168 hours.  Lipid Profile: No results for input(s): CHOL, HDL, LDLCALC, TRIG, CHOLHDL, LDLDIRECT in the last 72 hours.  Thyroid Function Tests: No results for input(s): TSH, T4TOTAL, FREET4, T3FREE, THYROIDAB in the last 72 hours.  Anemia Panel: No results for input(s): VITAMINB12, FOLATE, FERRITIN, TIBC, IRON, RETICCTPCT in the last 72 hours.  Urine analysis:    Component Value Date/Time   COLORURINE AMBER (A) 01/19/2017 1449   APPEARANCEUR HAZY (A) 01/19/2017 1449   LABSPEC 1.027 01/19/2017 1449   PHURINE 5.0 01/19/2017 1449   GLUCOSEU NEGATIVE 01/19/2017 1449   HGBUR SMALL (A) 01/19/2017 1449   BILIRUBINUR NEGATIVE 01/19/2017 1449   KETONESUR 5 (A) 01/19/2017 1449   PROTEINUR 100 (A) 01/19/2017  [place] 01/19/2017 [place] 01/19/2017 1449    Sepsis Labs: Lactic Acid, Venous No results found for: [place]   Microbiology: No results found for this or any previous visit (from the past 240 hour(s)).    RADIOLOGIC STUDIES ON ADMISSION: US Abdomen Limited Ruq  Result Date: 01/19/2017 CLINICAL DATA:  Abdominal pain. EXAM: ULTRASOUND ABDOMEN LIMITED RIGHT UPPER QUADRANT COMPARISON:  CT scans August 2012 and January 16, 2017. Ultrasounds from March 2013 and September 2016 FINDINGS: Gallbladder: Stones and sludge are seen in the gallbladder with no wall thickening, pericholecystic fluid, or Murphy's sign. Common bile duct: Diameter: 6 mm Liver: An echogenic rounded mass is seen in the liver measuring up to 1.9 cm. No other abnormalities. IMPRESSION: 1. Stones and sludge are seen in the  gallbladder with no wall thickening, pericholecystic fluid, or Murphy's sign. 2. A 1.9 cm mass is seen in the right hepatic lobe. This was not seen on previous studies. The finding is nonspecific but could represent a hemangioma. An MRI could further assess to exclude other possibilities since this abnormality was not seen previously. 3. The common bile duct is at the upper limits of normal. Recommend correlation with labs. Electronically Signed   By: Dorise Bullion III M.D   On: 01/19/2017 16:21    ASSESSMENT AND PLAN: Systemic inflammatory response syndrome: Probably secondary to gastroenteritis, plans are to provide supportive care with IV fluids and other measures. Follow cultures.  Gastroenteritis: He continues to have diarrhea-this is been ongoing for almost a week-he does have significant leukocytosis, and he did have fever during the initial part of his illness. He does give a history of antibiotics exposure sometime in March for a bronchitis. Hence, we will empirically start him on oral vancomycin. Check stool studies including GI pathogen panel and C. difficile PCR.  ? Pancreatitis: His symptoms are more consistent with gastroenteritis-not sure if pancreatitis can explain this degree of diarrhea. If he does have a viral illness causing diarrhea-he could in fact have a viral pancreatitis. Checking stool studies-will be nothing by mouth, and giving IV fluids. Not sure-given clinical circumstance-whether cholelithiasis seen on ultrasound is a incidental finding. Plans are to follow clinical course, if pancreatitis becomes more apparent over the next few days-he may need consideration of cholecystectomy.  Dehydration: Looks very dry-starting IV fluids- reevaluate volume status tomorrow.  GERD: PPI  Depression: Continue Wellbutrin and trazodone.  Further plan will depend as patient's clinical course evolves and further radiologic and laboratory data become available. Patient will be monitored  closely.  Above noted plan was discussed with patient/family face to face at bedside, they were in agreement.   CONSULTS: None  DVT Prophylaxis: Prophylactic Lovenox   Code Status: Full Code  Disposition Plan:  Discharge back home possibly in 2-3 days, depending on clinical course  Admission status:  Inpatient going to medical floor   The medical decision making on this patient was of high complexity and the patient is at high risk for clinical deterioration, therefore this is a level 3 visit.  Total time spent  55 minutes.Greater than 50% of this time was spent in counseling, explanation of diagnosis, planning of further management, and coordination of care.  Oren Binet [facility] Pager [phone]  If 7PM-7AM, please contact night-coverage [email or web address] Password [email or web address] 01/19/2017, 5:10 PM

## 2017-01-20 DIAGNOSIS — R945 Abnormal results of liver function studies: Secondary | ICD-10-CM

## 2017-01-20 DIAGNOSIS — A498 Other bacterial infections of unspecified site: Secondary | ICD-10-CM

## 2017-01-20 DIAGNOSIS — R7989 Other specified abnormal findings of blood chemistry: Secondary | ICD-10-CM

## 2017-01-20 DIAGNOSIS — A045 Campylobacter enteritis: Secondary | ICD-10-CM

## 2017-01-20 DIAGNOSIS — A09 Infectious gastroenteritis and colitis, unspecified: Secondary | ICD-10-CM

## 2017-01-20 DIAGNOSIS — F329 Major depressive disorder, single episode, unspecified: Secondary | ICD-10-CM

## 2017-01-20 DIAGNOSIS — E876 Hypokalemia: Secondary | ICD-10-CM

## 2017-01-20 LAB — GASTROINTESTINAL PANEL BY PCR, STOOL (REPLACES STOOL CULTURE)
ADENOVIRUS F40/41: NOT DETECTED
Astrovirus: NOT DETECTED
CAMPYLOBACTER SPECIES: DETECTED — AB
CRYPTOSPORIDIUM: NOT DETECTED
CYCLOSPORA CAYETANENSIS: NOT DETECTED
E. coli O157: NOT DETECTED
ENTEROTOXIGENIC E COLI (ETEC): NOT DETECTED
Entamoeba histolytica: NOT DETECTED
Enteroaggregative E coli (EAEC): NOT DETECTED
Giardia lamblia: NOT DETECTED
Norovirus GI/GII: NOT DETECTED
PLESIMONAS SHIGELLOIDES: NOT DETECTED
Rotavirus A: NOT DETECTED
SALMONELLA SPECIES: NOT DETECTED
SAPOVIRUS (I, II, IV, AND V): NOT DETECTED
SHIGELLA/ENTEROINVASIVE E COLI (EIEC): NOT DETECTED
Shiga like toxin producing E coli (STEC): DETECTED — AB
VIBRIO SPECIES: NOT DETECTED
Vibrio cholerae: NOT DETECTED
YERSINIA ENTEROCOLITICA: NOT DETECTED

## 2017-01-20 LAB — BASIC METABOLIC PANEL
Anion gap: 8 (ref 5–15)
BUN: 12 mg/dL (ref 6–20)
CHLORIDE: 103 mmol/L (ref 101–111)
CO2: 24 mmol/L (ref 22–32)
CREATININE: 1.05 mg/dL (ref 0.61–1.24)
Calcium: 8.2 mg/dL — ABNORMAL LOW (ref 8.9–10.3)
GFR calc Af Amer: >60 mL/min (ref >60)
GFR calc non Af Amer: >60 mL/min (ref >60)
Glucose, Bld: 80 mg/dL (ref 65–99)
Potassium: 3.3 mmol/L — ABNORMAL LOW (ref 3.5–5.1)
Sodium: 135 mmol/L (ref 135–145)

## 2017-01-20 LAB — CBC
HEMATOCRIT: 38.7 % — AB (ref 39.0–52.0)
Hemoglobin: 13.3 g/dL (ref 13.0–17.0)
MCH: 32.5 pg (ref 26.0–34.0)
MCHC: 34.4 g/dL (ref 30.0–36.0)
MCV: 94.6 fL (ref 78.0–100.0)
PLATELETS: 274 10*3/uL (ref 150–400)
RBC: 4.09 MIL/uL — ABNORMAL LOW (ref 4.22–5.81)
RDW: 12.3 % (ref 11.5–15.5)
WBC: 18 10*3/uL — ABNORMAL HIGH (ref 4.0–10.5)

## 2017-01-20 LAB — C DIFFICILE QUICK SCREEN W PCR REFLEX
C Diff antigen: NEGATIVE
C Diff interpretation: NOT DETECTED
C Diff toxin: NEGATIVE

## 2017-01-20 LAB — HEPATIC FUNCTION PANEL
ALT: 40 U/L (ref 17–63)
AST: 27 U/L (ref 15–41)
Albumin: 2.4 g/dL — ABNORMAL LOW (ref 3.5–5.0)
Alkaline Phosphatase: 176 U/L — ABNORMAL HIGH (ref 38–126)
BILIRUBIN INDIRECT: 0.9 mg/dL (ref 0.3–0.9)
Bilirubin, Direct: 0.5 mg/dL (ref 0.1–0.5)
TOTAL PROTEIN: 5.9 g/dL — AB (ref 6.5–8.1)
Total Bilirubin: 1.4 mg/dL — ABNORMAL HIGH (ref 0.3–1.2)

## 2017-01-20 LAB — LIPASE, BLOOD: LIPASE: 99 U/L — AB (ref 11–51)

## 2017-01-20 LAB — MAGNESIUM: Magnesium: 1.8 mg/dL (ref 1.7–2.4)

## 2017-01-20 MED ORDER — POTASSIUM CHLORIDE CRYS ER 20 MEQ PO TBCR
40.0000 meq | EXTENDED_RELEASE_TABLET | Freq: Once | ORAL | Status: AC
  Administered 2017-01-20: 40 meq via ORAL
  Filled 2017-01-20: qty 2

## 2017-01-20 MED ORDER — AZITHROMYCIN 500 MG PO TABS: 500.0000 mg | ORAL_TABLET | Freq: Every day | ORAL | Status: DC

## 2017-01-20 MED ORDER — AZITHROMYCIN 500 MG PO TABS
500.0000 mg | ORAL_TABLET | Freq: Every day | ORAL | Status: AC
  Administered 2017-01-20 – 2017-01-22 (×3): 500 mg via ORAL
  Filled 2017-01-20 (×3): qty 1

## 2017-01-20 MED ORDER — SODIUM CHLORIDE 0.9 % IV BOLUS (SEPSIS)
1000.0000 mL | Freq: Once | INTRAVENOUS | Status: AC
  Administered 2017-01-20: 1000 mL via INTRAVENOUS

## 2017-01-20 MED ORDER — PANTOPRAZOLE SODIUM 40 MG PO TBEC
40.0000 mg | DELAYED_RELEASE_TABLET | Freq: Every day | ORAL | Status: DC
  Administered 2017-01-21 – 2017-01-23 (×3): 40 mg via ORAL
  Filled 2017-01-20 (×4): qty 1

## 2017-01-20 MED ORDER — CIPROFLOXACIN HCL 500 MG PO TABS: 500.0000 mg | ORAL_TABLET | Freq: Two times a day (BID) | ORAL | Status: DC

## 2017-01-20 MED ORDER — MAGNESIUM SULFATE 4 GM/100ML IV SOLN
4.0000 g | Freq: Once | INTRAVENOUS | Status: AC
  Administered 2017-01-20: 4 g via INTRAVENOUS
  Filled 2017-01-20: qty 100

## 2017-01-20 NOTE — Consult Note (Signed)
[facility] Consult: 2:57 PM 01/20/2017  LOS: 1 day    Referring Provider: Dr Grandville Silos  Primary Care Physician:  Nicoletta Dress, MD Primary Gastroenterologist:  Carmell Austria, MD in [facility]     Reason for Consultation:  N/V, diarrhea.  Abd pain.     HPI: Jonathon Parker is a 66 y.o. male. Generally healthy with PMH GERD, sxs controlled on Nexium. S/p partial colonic resection of a large,"precancerous" mass/polyp at age 50.  A few years later, in 2007, underwent appendectomy.  2013 underwent ultrasound and HIDA scan, showed GB stones Has kept up with surveillance colonoscopies, last was ~ 2015, no recurrent polyps.  Previous EGD with GERD.       Starting Sunday 8 days ago, developed malaise, watery but non-bloody diarrhea of 12 plus stools per day.  Nausea, anorexia, weakness.  PMD visit on Wednesday: got shot of Phenergan and Rx for Lomotil.  sxs and malaise continued.   To [place] 6/14 with dehydration, some confusion.  LFTs, lipase normal.  WBCs 12K.  Hydrated with 3 liters of IVF.   6/14 CT ab pelvis with contrast.  No hepatobiliary findings, no gallstones or dilated CBD.  Liquid stool in colon, ? Diarrheal illness.   Returned to ED yesterday Sunday.  He has lost a least 10#, as of PMD weaks.   Ultrasound 6/17 shows sludge, stones.  No evidence of cholecystitis. 1.9 cm mass in right lobe of liver.  CBD 76m (upper normal).  Lumbar/sacral DDD.    LFTs with alk phos to 176, t bili to 1.7, normal transaminases.  Lipase to 99.   WBCs to 23.1.   Received 2 doses of po Vanc, now discontinued as C diff is negative. Stool pathogen panel has returned positive for Campylobacter and e coli like Shiga-like toxin.  Antibiotics not yet started.   In 2013 had ultrasound and HIDA.  Former showed propable GB polyp, subtle GB  hypoechogenicity and nuc study normal.   Drinks 2 to 3[place] teas per evening, no ETOH since he got sick last week.  Fm hx negative for hepatic, GB disease.  No colo-recta neoplasia Eats out a fair amount, but his wife is not ill.      Past Medical History:  Diagnosis Date  . Depression     Past Surgical History:  Procedure Laterality Date  . COLON SURGERY    . HERNIA REPAIR      Prior to Admission medications   Medication Sig Start Date End Date Taking? Authorizing Provider  buPROPion (WELLBUTRIN XL) 300 MG 24 hr tablet Take 300 mg by mouth at bedtime.   Yes [provider]  diphenoxylate-atropine (LOMOTIL) 2.5-0.025 MG tablet Take 1 tablet by mouth 4 (four) times daily as needed for diarrhea or loose stools. 01/16/17  Yes JTanna Furry MD  esomeprazole (NEXIUM) 40 MG capsule Take 40 mg by mouth daily at 12 noon.   Yes [provider]  HYDROcodone-acetaminophen (NORCO/VICODIN) 5-325 MG tablet Take 2 tablets by mouth every 4 (four) hours as needed. 01/16/17  Yes  Tanna Furry, MD  ibuprofen (ADVIL,MOTRIN) 200 MG tablet Take 800 mg by mouth every 6 (six) hours as needed for fever or mild pain.   Yes [provider]  ondansetron (ZOFRAN ODT) 4 MG disintegrating tablet Take 1 tablet (4 mg total) by mouth every 8 (eight) hours as needed for nausea. 01/16/17  Yes Tanna Furry, MD  traZODone (DESYREL) 100 MG tablet Take 100 mg by mouth at bedtime.   Yes [provider]    Scheduled Meds: . buPROPion  300 mg Oral QHS  . enoxaparin (LOVENOX) injection  40 mg Subcutaneous Q24H  . pantoprazole (PROTONIX) IV  40 mg Intravenous Q24H  . traZODone  100 mg Oral QHS   Infusions: . sodium chloride 125 mL/hr at 01/20/17 1116   PRN Meds: acetaminophen **OR** acetaminophen, albuterol, morphine injection, ondansetron **OR** ondansetron (ZOFRAN) IV, oxyCODONE   Allergies as of 01/19/2017  . (No Known Allergies)    History reviewed. No pertinent family  history.  Social History   Social History  . Marital status: Married    Spouse name: N/A  . Number of children: N/A  . Years of education: N/A   Occupational History  . Not on file.   Social History Main Topics  . Smoking status: Never Smoker  . Smokeless tobacco: Never Used  . Alcohol use Yes  . Drug use: No  . Sexual activity: Yes   Other Topics Concern  . Not on file   Social History Narrative  . No narrative on file    REVIEW OF SYSTEMS: Constitutional:  Generally active and well ENT:  No nose bleeds Pulm:  No SOB, no cough.   CV:  No palpitations, no LE edema. No chest pain.   GU:  Urine dark yesterday.  No hematuria, no frequency GI:  Per HPI.   Heme:  No unusual bleeding or bruising.     Transfusions:  none Neuro:  No headaches, no peripheral tingling or numbness Derm:  No itching, no rash or sores.  Endocrine:  No sweats or chills.  No polyuria or dysuria Immunization:  Did not inquire.  Travel:  None beyond local counties in last few months.    PHYSICAL EXAM: Vital signs in last 24 hours: Vitals:   01/19/17 2149 01/20/17 0454  BP: 111/64 127/70  Pulse: 78 68  Resp: 18 18  Temp: (!) 100.6 F (38.1 C) 98 F (36.7 C)   Wt Readings from Last 3 Encounters:  01/19/17 84.1 kg (185 lb 4.8 oz)  01/16/17 78.9 kg (174 lb)    General: pleasant, does not look ill.  comfortable Head:  No asymmetry or facial edema  Eyes:  No icterus or pallor Ears:  Not HOH  Nose:  No congestion Mouth:  Good dentition.  Moist and clear MM.   Neck:  No mass, no JVD.  No TMG.   Lungs:  Clear bil.  No cough or SOB Heart: RRR.  No MRG.  S1, S2 present.   Abdomen:  Soft, ND.  No HSM or mass.  Moderate epigastric and left abdominal tenderness.    Rectal: deferred   Musc/Skeltl: no joint swelling, redness or gross deformity Extremities:  No CCE  Neurologic:  Alert, oriented x 3.  No tremor or limb weakness Skin:  No rash, sores or telangectasia Tattoos:  none Nodes:  No  cervical adenopathy   Psych:  Pleasant, calm, cooperative.  Not depressed   Intake/Output from previous day: 06/17 0701 - 06/18 0700 In: 2127.1 [I.V.:1127.1;  IV Piggyback:1000] Out: -  Intake/Output this shift: No intake/output data recorded.  LAB RESULTS:  Recent Labs  01/19/17 1210 01/19/17 1921 01/20/17 0615  WBC 19.5* 23.1* 18.0*  HGB 15.5 15.4 13.3  HCT 46.4 43.6 38.7*  PLT 298 324 274   BMET Lab Results  Component Value Date   NA 135 01/20/2017   NA 134 (L) 01/19/2017   NA 134 (L) 01/16/2017   K 3.3 (L) 01/20/2017   K 3.5 01/19/2017   K 3.9 01/16/2017   CL 103 01/20/2017   CL 98 (L) 01/19/2017   CL 101 01/16/2017   CO2 24 01/20/2017   CO2 25 01/19/2017   CO2 22 01/16/2017   GLUCOSE 80 01/20/2017   GLUCOSE 83 01/19/2017   GLUCOSE 115 (H) 01/16/2017   BUN 12 01/20/2017   BUN 17 01/19/2017   BUN 40 (H) 01/16/2017   CREATININE 1.05 01/20/2017   CREATININE 1.12 01/19/2017   CREATININE 1.13 01/19/2017   CALCIUM 8.2 (L) 01/20/2017   CALCIUM 9.3 01/19/2017   CALCIUM 9.6 01/16/2017   LFT  Recent Labs  01/19/17 1210 01/20/17 0614  PROT 6.8 5.9*  ALBUMIN 3.1* 2.4*  AST 35 27  ALT 54 40  ALKPHOS 164* 176*  BILITOT 1.7* 1.4*  BILIDIR  --  0.5  IBILI  --  0.9   C-Diff No components found for: CDIFF Lipase     Component Value Date/Time   LIPASE 99 (H) 01/20/2017 0923    Drugs of Abuse  No results found for: LABOPIA, COCAINSCRNUR, LABBENZ, AMPHETMU, THCU, LABBARB   RADIOLOGY STUDIES: US Abdomen Limited Ruq  Result Date: 01/19/2017 CLINICAL DATA:  Abdominal pain. EXAM: ULTRASOUND ABDOMEN LIMITED RIGHT UPPER QUADRANT COMPARISON:  CT scans August 2012 and January 16, 2017. Ultrasounds from March 2013 and September 2016 FINDINGS: Gallbladder: Stones and sludge are seen in the gallbladder with no wall thickening, pericholecystic fluid, or Murphy's sign. Common bile duct: Diameter: 6 mm Liver: An echogenic rounded mass is seen in the liver measuring up to  1.9 cm. No other abnormalities. IMPRESSION: 1. Stones and sludge are seen in the gallbladder with no wall thickening, pericholecystic fluid, or Murphy's sign. 2. A 1.9 cm mass is seen in the right hepatic lobe. This was not seen on previous studies. The finding is nonspecific but could represent a hemangioma. An MRI could further assess to exclude other possibilities since this abnormality was not seen previously. 3. The common bile duct is at the upper limits of normal. Recommend correlation with labs. Electronically Signed   By: Dorise Bullion III M.D   On: 01/19/2017 16:21     IMPRESSION:   *  Diarrheal illness.  Stool path panel + for Campylobacter and E coli with a Shigella like toxin.  Not currently on abx  *  Mild elevation of LFTs.  Sludge and stones on ultrasound.  Lipase mildly elevated but low suspicion for pancreatitis.  The labs may be subsequent to the acute infection.      PLAN:     *  Start Azithromycin 500 mg daily for 3 days as per Up to Date and discussion of case with Dr Baxter Flattery.  Enteric precautions.   Allow clear diet.  Switched to oral PPI.     Azucena Freed  01/20/2017, 2:57 PM Pager: [phone]     Attending physician's note   I have taken a history, examined the patient and reviewed the chart. I agree with the Advanced Practitioner's note, impression and recommendations.  *  Diarrheal illness due to Campylobacter and STEC found on GI pathogen panel. Azithromycin 500 mg po bid for 3 days. Clear liquid diet and advance gradually as diarrhea improves. Continue IV hydration.  * Mildly elevated LFTs, likely reactive to acute illness. R/O active biliary process. Consider MRI/MRCP as outpatient if LFTs do not return to normal.  * Cholelithiasis and GB sludge, appears to be asymptomatic.  * Hepatic lesion, suspect this is a hemangioma. MRI as outpatient to further evaluate after acute illness resolves.       Lucio Edward, MD [facility] [phone] Mon-Fri 8a-5p [phone] after  5p, weekends, holidays

## 2017-01-20 NOTE — Progress Notes (Signed)
Initial Nutrition Assessment  DOCUMENTATION CODES:   Not applicable  INTERVENTION:   -RD will follow for diet advancement and supplement as appropriate  NUTRITION DIAGNOSIS:   Inadequate oral intake related to altered GI function as evidenced by NPO status.  GOAL:   Patient will meet greater than or equal to 90% of their needs  MONITOR:   Diet advancement, Labs, Weight trends, Skin, I & O's  REASON FOR ASSESSMENT:   Malnutrition Screening Tool    ASSESSMENT:   Jonathon Parker is a 66 y.o. male with medical history significant of GERD, depression who presented for dry heaving, diffuse abdominal pain, watery diarrhea-for 5-7 days.  Pt admitted with abdominal pain and diarrhea.   Pt very somnolent at time of visit. Hx obtained from pt wife at bedside. She reports pt was in usual state of health until last week, when he developed abdominal pain and diarrhea, which worsened with eating. At baseline, pt has a very good appetite and is very independent and active, going to the gym 3 times per week.   Pt wife estimates pt has lost about 10-15# within the past week. She reports UBW is around 185#. She is also confident that pt is improving, as he was requesting a cheeseburger this morning.   Nutrition-Focused physical exam completed. Findings are no fat depletion, no muscle depletion, and no edema.   Labs reviewed: K: 3.3.  Diet Order:  Diet NPO time specified Except for: Sips with Meds, Ice Chips  Skin:  Reviewed, no issues  Last BM:  01/20/17  Height:   Ht Readings from Last 1 Encounters:  01/19/17 6' (1.829 m)    Weight:   Wt Readings from Last 1 Encounters:  01/19/17 185 lb 4.8 oz (84.1 kg)    Ideal Body Weight:  80.9 kg  BMI:  Body mass index is 25.13 kg/m.  Estimated Nutritional Needs:   Kcal:  2100-2300  Protein:  100-115 grams  Fluid:  2.1-2.3 L  EDUCATION NEEDS:   No education needs identified at this time  Brette Cast A. Jimmye Norman, RD, LDN,  CDE Pager: [phone] After hours Pager: [phone]

## 2017-01-20 NOTE — Progress Notes (Addendum)
PROGRESS NOTE    Jonathon Parker  TDD:[id number] DOB: 14-Jan-1951 DOA: 01/19/2017 PCP: Nicoletta Dress, MD    Brief Narrative:  Patient is a 66 year old gentleman history of depression, gastroesophageal reflux disease presented to the ED with diffuse abdominal pain, nausea, dry heaves, watery numerous stools for the past 5-7 days. C. difficile PCR negative. GI pathogen panel consistent with a Shiga toxin producing Escherichia coli and campylobacter diarrhea. Patient also noted to have elevated bilirubin levels, elevated lipase levels, right upper quadrant ultrasound with gallstones and gallbladder sludge. GI consulted.   Assessment & Plan:   Principal Problem:   Sepsis ([place]) Active Problems:   Campylobacter diarrhea   STEC (Shiga toxin-producing Escherichia coli)   Gastroenteritis   GERD (gastroesophageal reflux disease)   Depression   Diarrhea of infectious origin   Elevated LFTs   Hypokalemia  #1 sepsis secondary to campylobacter diarrhea and Shiga toxin producing Escherichia coli Patient presented with copious amounts of diarrhea for greater than a week with a significant leukocytosis noted as well as fever. C. difficile PCR was negative. GI pathogen panel consistent with campylobacter diarrhea and Shiga toxin producing Escherichia coli. Patient started on azithromycin 3 days. Discontinue oral vancomycin. Continue IV fluids. Antiemetics. Pain management. Supportive care. GI consulted.  #2 elevated LFTs/hyperbilirubinemia/?Pancreatitis Patient noted to have elevated LFTs, lipase levels elevated, right upper quadrant ultrasound with gallstones and sludge noted. Concern for possible acute clinical pancreatitis versus chemical pancreatitis. GI consulted for further evaluation and management. Outpatient MRI MRCP if LFTs don't normalize per GI recommendations.  #3 gastroesophageal reflux disease PPI.  #4 hypokalemia secondary to GI losses. Replete.  #5 depression Continue home  regimen of Wellbutrin.  #6 dehydration IV fluids.  #7 hepatic lesion Likely hemangioma. MRI as outpatient for further evaluation.    DVT prophylaxis: Lovenox Code Status: Full Family Communication: Updated patient and family at bedside. Disposition Plan: Home once acute diarrhea issues have improved with no further abdominal pain and tolerating oral intake and pending GI evaluation.   Consultants:   Gastroenterology  Procedures:   Right upper quadrant abdominal ultrasound 01/19/2017  Antimicrobials:   Azithromycin 3 days 01/20/2017  Oral Vancomycin 01/19/2017>>>> 01/20/2017   Subjective: Patient states has had 4 more bouts of loose stools today. Patient denies any further emesis or dry heaves. No shortness of breath. No chest pain.  Objective: Vitals:   01/19/17 1842 01/19/17 2149 01/20/17 0454 01/20/17 1503  BP: 120/72 111/64 127/70 127/68  Pulse: 68 78 68 64  Resp: 16 18 18 20   Temp: 98.5 F (36.9 C) (!) 100.6 F (38.1 C) 98 F (36.7 C) 98.1 F (36.7 C)  TempSrc: Oral Oral Oral Oral  SpO2: 95% 95% 96% 100%  Weight: 84.1 kg (185 lb 4.8 oz)     Height: 6' (1.829 m)       Intake/Output Summary (Last 24 hours) at 01/20/17 1831 Last data filed at 01/20/17 1600  Gross per 24 hour  Intake          2508.33 ml  Output                0 ml  Net          2508.33 ml   Filed Weights   01/19/17 1209 01/19/17 1842  Weight: 78.9 kg (174 lb) 84.1 kg (185 lb 4.8 oz)    Examination:  General exam: Appears calm and comfortable  Respiratory system: Clear to auscultation. Respiratory effort normal. Cardiovascular system: S1 & S2 heard, RRR. No JVD,  murmurs, rubs, gallops or clicks. No pedal edema. Gastrointestinal system: Abdomen is nondistended, soft and Tenderness to palpation diet diffusely greater in the epigastrium. No organomegaly or masses felt. Normal bowel sounds heard. Central nervous system: Alert and oriented. No focal neurological deficits. Extremities:  Symmetric 5 x 5 power. Skin: No rashes, lesions or ulcers Psychiatry: Judgement and insight appear normal. Mood & affect appropriate.     Data Reviewed: I have personally reviewed following labs and imaging studies  CBC:  Recent Labs Lab 01/16/17 1213 01/19/17 1210 01/19/17 1921 01/20/17 0615  WBC 12.0* 19.5* 23.1* 18.0*  HGB 17.6* 15.5 15.4 13.3  HCT 51.4 46.4 43.6 38.7*  MCV 96.6 96.1 95.2 94.6  PLT 215 298 324 761   Basic Metabolic Panel:  Recent Labs Lab 01/16/17 1213 01/19/17 1210 01/19/17 1921 01/20/17 0614 01/20/17 0615  NA 134* 134*  --   --  135  K 3.9 3.5  --   --  3.3*  CL 101 98*  --   --  103  CO2 22 25  --   --  24  GLUCOSE 115* 83  --   --  80  BUN 40* 17  --   --  12  CREATININE 1.91* 1.13 1.12  --  1.05  CALCIUM 9.6 9.3  --   --  8.2*  MG  --   --   --  1.8  --    GFR: Estimated Creatinine Clearance: 76 mL/min (by C-G formula based on SCr of 1.05 mg/dL). Liver Function Tests:  Recent Labs Lab 01/16/17 1213 01/19/17 1210 01/20/17 0614  AST 35 35 27  ALT 28 54 40  ALKPHOS 74 164* 176*  BILITOT 0.6 1.7* 1.4*  PROT 7.2 6.8 5.9*  ALBUMIN 3.8 3.1* 2.4*    Recent Labs Lab 01/16/17 1213 01/19/17 1210 01/20/17 0614  LIPASE 29 84* 99*   No results for input(s): AMMONIA in the last 168 hours. Coagulation Profile: No results for input(s): INR, PROTIME in the last 168 hours. Cardiac Enzymes: No results for input(s): CKTOTAL, CKMB, CKMBINDEX, TROPONINI in the last 168 hours. BNP (last 3 results) No results for input(s): PROBNP in the last 8760 hours. HbA1C: No results for input(s): HGBA1C in the last 72 hours. CBG: No results for input(s): GLUCAP in the last 168 hours. Lipid Profile: No results for input(s): CHOL, HDL, LDLCALC, TRIG, CHOLHDL, LDLDIRECT in the last 72 hours. Thyroid Function Tests: No results for input(s): TSH, T4TOTAL, FREET4, T3FREE, THYROIDAB in the last 72 hours. Anemia Panel: No results for input(s): VITAMINB12,  FOLATE, FERRITIN, TIBC, IRON, RETICCTPCT in the last 72 hours. Sepsis Labs: No results for input(s): PROCALCITON, LATICACIDVEN in the last 168 hours.  Recent Results (from the past 240 hour(s))  Culture, blood (routine x 2)     Status: None (Preliminary result)   Collection Time: 01/19/17  7:08 PM  Result Value Ref Range Status   Specimen Description BLOOD LEFT ANTECUBITAL  Final   Special Requests   Final    BOTTLES DRAWN AEROBIC AND ANAEROBIC Blood Culture adequate volume   Culture NO GROWTH < 24 HOURS  Final   Report Status PENDING  Incomplete  Culture, blood (routine x 2)     Status: None (Preliminary result)   Collection Time: 01/19/17  7:28 PM  Result Value Ref Range Status   Specimen Description BLOOD LEFT ARM  Final   Special Requests   Final    BOTTLES DRAWN AEROBIC AND ANAEROBIC Blood Culture adequate volume  Culture NO GROWTH < 24 HOURS  Final   Report Status PENDING  Incomplete  C difficile quick scan w PCR reflex     Status: None   Collection Time: 01/19/17  7:46 PM  Result Value Ref Range Status   C Diff antigen NEGATIVE NEGATIVE Final   C Diff toxin NEGATIVE NEGATIVE Final   C Diff interpretation No C. difficile detected.  Final  Gastrointestinal Panel by PCR , Stool     Status: Abnormal   Collection Time: 01/19/17  7:46 PM  Result Value Ref Range Status   Campylobacter species DETECTED (A) NOT DETECTED Final   Plesimonas shigelloides NOT DETECTED NOT DETECTED Final   Salmonella species NOT DETECTED NOT DETECTED Final   Yersinia enterocolitica NOT DETECTED NOT DETECTED Final   Vibrio species NOT DETECTED NOT DETECTED Final   Vibrio cholerae NOT DETECTED NOT DETECTED Final   Enteroaggregative E coli (EAEC) NOT DETECTED NOT DETECTED Final   Enterotoxigenic E coli (ETEC) NOT DETECTED NOT DETECTED Final   Shiga like toxin producing E coli (STEC) DETECTED (A) NOT DETECTED Final    Comment: RESULT CALLED TO, READ BACK BY AND VERIFIED WITH: IRECI OMOSEEBI ON 01/20/17  AT 1353 [place]    E. coli O157 NOT DETECTED NOT DETECTED Final   Shigella/Enteroinvasive E coli (EIEC) NOT DETECTED NOT DETECTED Final   Cryptosporidium NOT DETECTED NOT DETECTED Final   Cyclospora cayetanensis NOT DETECTED NOT DETECTED Final   Entamoeba histolytica NOT DETECTED NOT DETECTED Final   Giardia lamblia NOT DETECTED NOT DETECTED Final   Adenovirus F40/41 NOT DETECTED NOT DETECTED Final   Astrovirus NOT DETECTED NOT DETECTED Final   Norovirus GI/GII NOT DETECTED NOT DETECTED Final   Rotavirus A NOT DETECTED NOT DETECTED Final   Sapovirus (I, II, IV, and V) NOT DETECTED NOT DETECTED Final         Radiology Studies: US Abdomen Limited Ruq  Result Date: 01/19/2017 CLINICAL DATA:  Abdominal pain. EXAM: ULTRASOUND ABDOMEN LIMITED RIGHT UPPER QUADRANT COMPARISON:  CT scans August 2012 and January 16, 2017. Ultrasounds from March 2013 and September 2016 FINDINGS: Gallbladder: Stones and sludge are seen in the gallbladder with no wall thickening, pericholecystic fluid, or Murphy's sign. Common bile duct: Diameter: 6 mm Liver: An echogenic rounded mass is seen in the liver measuring up to 1.9 cm. No other abnormalities. IMPRESSION: 1. Stones and sludge are seen in the gallbladder with no wall thickening, pericholecystic fluid, or Murphy's sign. 2. A 1.9 cm mass is seen in the right hepatic lobe. This was not seen on previous studies. The finding is nonspecific but could represent a hemangioma. An MRI could further assess to exclude other possibilities since this abnormality was not seen previously. 3. The common bile duct is at the upper limits of normal. Recommend correlation with labs. Electronically Signed   By: Dorise Bullion III M.D   On: 01/19/2017 16:21        Scheduled Meds: . azithromycin  500 mg Oral Daily  . buPROPion  300 mg Oral QHS  . enoxaparin (LOVENOX) injection  40 mg Subcutaneous Q24H  . [START ON 01/21/2017] pantoprazole  40 mg Oral Q0600  . traZODone  100 mg Oral  QHS   Continuous Infusions: . sodium chloride 150 mL/hr at 01/20/17 1632     LOS: 1 day    Time spent: 35 minutes.    Irine Seal, MD [facility] Pager [phone]  If 7PM-7AM, please contact night-coverage [email or web address] Password [facility] 01/20/2017, 6:31 PM

## 2017-01-21 LAB — COMPREHENSIVE METABOLIC PANEL
ALBUMIN: 2.2 g/dL — AB (ref 3.5–5.0)
ALK PHOS: 135 U/L — AB (ref 38–126)
ALT: 31 U/L (ref 17–63)
ANION GAP: 9 (ref 5–15)
AST: 23 U/L (ref 15–41)
BUN: 11 mg/dL (ref 6–20)
CALCIUM: 7.6 mg/dL — AB (ref 8.9–10.3)
CO2: 21 mmol/L — AB (ref 22–32)
Chloride: 108 mmol/L (ref 101–111)
Creatinine, Ser: 0.91 mg/dL (ref 0.61–1.24)
GFR calc non Af Amer: >60 mL/min (ref >60)
GLUCOSE: 68 mg/dL (ref 65–99)
POTASSIUM: 3.4 mmol/L — AB (ref 3.5–5.1)
SODIUM: 138 mmol/L (ref 135–145)
Total Bilirubin: 0.9 mg/dL (ref 0.3–1.2)
Total Protein: 4.8 g/dL — ABNORMAL LOW (ref 6.5–8.1)

## 2017-01-21 LAB — CBC WITH DIFFERENTIAL/PLATELET
BASOS ABS: 0 10*3/uL (ref 0.0–0.1)
BASOS PCT: 0 %
EOS ABS: 0 10*3/uL (ref 0.0–0.7)
Eosinophils Relative: 0 %
HEMATOCRIT: 36.5 % — AB (ref 39.0–52.0)
HEMOGLOBIN: 12.4 g/dL — AB (ref 13.0–17.0)
LYMPHS PCT: 48 %
Lymphs Abs: 6.3 10*3/uL — ABNORMAL HIGH (ref 0.7–4.0)
MCH: 32.6 pg (ref 26.0–34.0)
MCHC: 34 g/dL (ref 30.0–36.0)
MCV: 96.1 fL (ref 78.0–100.0)
MONOS PCT: 4 %
Monocytes Absolute: 0.5 10*3/uL (ref 0.1–1.0)
NEUTROS ABS: 6.3 10*3/uL (ref 1.7–7.7)
NEUTROS PCT: 48 %
Platelets: 276 10*3/uL (ref 150–400)
RBC: 3.8 MIL/uL — ABNORMAL LOW (ref 4.22–5.81)
RDW: 12.7 % (ref 11.5–15.5)
WBC: 13.1 10*3/uL — ABNORMAL HIGH (ref 4.0–10.5)

## 2017-01-21 LAB — LIPASE, BLOOD: LIPASE: 133 U/L — AB (ref 11–51)

## 2017-01-21 LAB — MAGNESIUM: MAGNESIUM: 2 mg/dL (ref 1.7–2.4)

## 2017-01-21 MED ORDER — SODIUM CHLORIDE 0.9 % IV SOLN
INTRAVENOUS | Status: DC
  Administered 2017-01-21: 14:00:00 via INTRAVENOUS
  Filled 2017-01-21 (×3): qty 1000

## 2017-01-21 MED ORDER — POTASSIUM CHLORIDE 20 MEQ PO PACK
40.0000 meq | PACK | Freq: Once | ORAL | Status: AC
  Administered 2017-01-21: 40 meq via ORAL
  Filled 2017-01-21 (×2): qty 2

## 2017-01-21 MED ORDER — MAGIC MOUTHWASH W/LIDOCAINE
10.0000 mL | Freq: Four times a day (QID) | ORAL | Status: DC
  Administered 2017-01-21 – 2017-01-23 (×8): 10 mL via ORAL
  Filled 2017-01-21 (×9): qty 10

## 2017-01-21 MED ORDER — POTASSIUM CHLORIDE IN NACL 40-0.9 MEQ/L-% IV SOLN
INTRAVENOUS | Status: DC
  Administered 2017-01-22 – 2017-01-23 (×4): 125 mL/h via INTRAVENOUS
  Filled 2017-01-21 (×4): qty 1000

## 2017-01-21 NOTE — Progress Notes (Signed)
Progress Note   Subjective   Diarrhea persists however it has slightly improved.    Objective  Vital signs in last 24 hours: Temp:  [98.1 F (36.7 C)-98.6 F (37 C)] 98.2 F (36.8 C) (06/19 0522) Pulse Rate:  [64-68] 66 (06/19 0522) Resp:  [16-20] 18 (06/19 0522) BP: (127-149)/(68-73) 139/73 (06/19 0522) SpO2:  [97 %-100 %] 97 % (06/19 0522) Last BM Date: 01/20/17  General: Alert, well-developed, in NAD Heart:  Regular rate and rhythm; no murmurs Chest: Clear to ascultation bilaterally Abdomen:  Soft, nontender and nondistended. Normal bowel sounds, without guarding, and without rebound.   Extremities:  Without edema. Neurologic:  Alert and  oriented x4; grossly normal neurologically. Psych:  Alert and cooperative. Normal mood and affect.  Intake/Output from previous day: 06/18 0701 - 06/19 0700 In: 2543.8 [I.V.:2543.8] Out: -  Intake/Output this shift: No intake/output data recorded.  Lab Results:  Recent Labs  01/19/17 1921 01/20/17 0615 01/21/17 0542  WBC 23.1* 18.0* 13.1*  HGB 15.4 13.3 12.4*  HCT 43.6 38.7* 36.5*  PLT 324 274 276   BMET  Recent Labs  01/19/17 1210 01/19/17 1921 01/20/17 0615 01/21/17 0542  NA 134*  --  135 138  K 3.5  --  3.3* 3.4*  CL 98*  --  103 108  CO2 25  --  24 21*  GLUCOSE 83  --  80 68  BUN 17  --  12 11  CREATININE 1.13 1.12 1.05 0.91  CALCIUM 9.3  --  8.2* 7.6*   LFT  Recent Labs  01/20/17 0614 01/21/17 0542  PROT 5.9* 4.8*  ALBUMIN 2.4* 2.2*  AST 27 23  ALT 40 31  ALKPHOS 176* 135*  BILITOT 1.4* 0.9  BILIDIR 0.5  --   IBILI 0.9  --     Studies/Results: US Abdomen Limited Ruq  Result Date: 01/19/2017 CLINICAL DATA:  Abdominal pain. EXAM: ULTRASOUND ABDOMEN LIMITED RIGHT UPPER QUADRANT COMPARISON:  CT scans August 2012 and January 16, 2017. Ultrasounds from March 2013 and September 2016 FINDINGS: Gallbladder: Stones and sludge are seen in the gallbladder with no wall thickening, pericholecystic fluid,  or Murphy's sign. Common bile duct: Diameter: 6 mm Liver: An echogenic rounded mass is seen in the liver measuring up to 1.9 cm. No other abnormalities. IMPRESSION: 1. Stones and sludge are seen in the gallbladder with no wall thickening, pericholecystic fluid, or Murphy's sign. 2. A 1.9 cm mass is seen in the right hepatic lobe. This was not seen on previous studies. The finding is nonspecific but could represent a hemangioma. An MRI could further assess to exclude other possibilities since this abnormality was not seen previously. 3. The common bile duct is at the upper limits of normal. Recommend correlation with labs. Electronically Signed   By: Dorise Bullion III M.D   On: 01/19/2017 16:21      Assessment & Plan      1. Campylobacter and STEC diarrhea, improved. WBC decreased to 13.1.  2. LFTs improving.  3. Liver lesion-suspect hemangioma.    * Complete 3 days of azithromycin 500 mg po bid. * Pt and wife advised to have pets checked by their vet. * Continue IV hydration until he can adequately take PO to hydrate. * Imodium prn after full course of antibiotics has been completed.  * Clear liquids for today, advance slowly to low residue, low fat, lactose free diet.  * Hepatic MRI as outpatient with Dr. Carmell Austria.  * Outpatient GI  follow up with Dr. Rinaldo Cloud in [place].  * GI signing off.   Lucio Edward, MD [facility] [phone] Mon-Fri 8a-5p [phone] after 5p, weekends, holidays    LOS: 2 days   Norberto Sorenson T. Fuller Plan MD 01/21/2017, 8:54 AM

## 2017-01-21 NOTE — Progress Notes (Signed)
PROGRESS NOTE    Rollin Kotowski  FGH:[id number] DOB: 1950-08-08 DOA: 01/19/2017 PCP: Nicoletta Dress, MD    Brief Narrative:  Patient is a 66 year old gentleman history of depression, gastroesophageal reflux disease presented to the ED with diffuse abdominal pain, nausea, dry heaves, watery numerous stools for the past 5-7 days. C. difficile PCR negative. GI pathogen panel consistent with a Shiga toxin producing Escherichia coli and campylobacter diarrhea. Patient also noted to have elevated bilirubin levels, elevated lipase levels, right upper quadrant ultrasound with gallstones and gallbladder sludge. GI consulted.   Assessment & Plan:   Principal Problem:   Sepsis ([place]) Active Problems:   Campylobacter diarrhea   STEC (Shiga toxin-producing Escherichia coli)   Gastroenteritis   GERD (gastroesophageal reflux disease)   Depression   Diarrhea of infectious origin   Elevated LFTs   Hypokalemia  #1 sepsis secondary to campylobacter diarrhea and Shiga toxin producing Escherichia coli Patient presented with copious amounts of diarrhea for greater than a week with a significant leukocytosis noted as well as fever. C. difficile PCR was negative. GI pathogen panel consistent with campylobacter diarrhea and Shiga toxin producing Escherichia coli. Patient started on azithromycin 3 days. Discontinued oral vancomycin. Continue IV fluids. Antiemetics. Pain management. Supportive care. GI consulted.  #2 elevated LFTs/hyperbilirubinemia/? Chemical Pancreatitis Patient noted to have elevated LFTs, lipase levels elevated, right upper quadrant ultrasound with gallstones and sludge noted. Concern for possible acute clinical pancreatitis versus chemical pancreatitis. Patient denies any significant epigastric pain and has no further nausea or emesis. Patient tolerating current diet. Outpatient MRI MRCP if LFTs don't normalize per GI recommendations. Appreciate GI input.  #3 gastroesophageal reflux  disease PPI.  #4 hypokalemia secondary to GI losses. Replete.  #5 depression Continue home regimen of Wellbutrin.  #6 dehydration Decrease IV fluids to 125 mL per hour.  #7 hepatic lesion Likely hemangioma. MRI as outpatient for further evaluation.    DVT prophylaxis: Lovenox Code Status: Full Family Communication: Updated patient and family at bedside. Disposition Plan: Home once acute diarrhea issues have improved with no further abdominal pain and tolerating oral intake and able to stay hydrated.   Consultants:   Gastroenterology  Procedures:   Right upper quadrant abdominal ultrasound 01/19/2017  Antimicrobials:   Azithromycin 3 days 01/20/2017  Oral Vancomycin 01/19/2017>>>> 01/20/2017   Subjective: Patient states still having multiple loose bowel movements. Patient denies any further emesis or dry heaves. No shortness of breath. No chest pain. Feeling a little bit better than on admission.   Objective: Vitals:   01/20/17 0454 01/20/17 1503 01/20/17 2301 01/21/17 0522  BP: 127/70 127/68 (!) 149/73 139/73  Pulse: 68 64 68 66  Resp: 18 20 16 18   Temp: 98 F (36.7 C) 98.1 F (36.7 C) 98.6 F (37 C) 98.2 F (36.8 C)  TempSrc: Oral Oral  Oral  SpO2: 96% 100% 98% 97%  Weight:      Height:        Intake/Output Summary (Last 24 hours) at 01/21/17 1334 Last data filed at 01/21/17 0958  Gross per 24 hour  Intake          2661.75 ml  Output                0 ml  Net          2661.75 ml   Filed Weights   01/19/17 1209 01/19/17 1842  Weight: 78.9 kg (174 lb) 84.1 kg (185 lb 4.8 oz)    Examination:  General exam: Appears  calm and comfortable  Respiratory system: Clear to auscultation. Respiratory effort normal. Cardiovascular system: S1 & S2 heard, RRR. No JVD, murmurs, rubs, gallops or clicks. No pedal edema. Gastrointestinal system: Abdomen is nondistended, soft and Less Tenderness to palpation. No organomegaly or masses felt. Normal bowel sounds  heard. Central nervous system: Alert and oriented. No focal neurological deficits. Extremities: Symmetric 5 x 5 power. Skin: No rashes, lesions or ulcers Psychiatry: Judgement and insight appear normal. Mood & affect appropriate.     Data Reviewed: I have personally reviewed following labs and imaging studies  CBC:  Recent Labs Lab 01/16/17 1213 01/19/17 1210 01/19/17 1921 01/20/17 0615 01/21/17 0542  WBC 12.0* 19.5* 23.1* 18.0* 13.1*  NEUTROABS  --   --   --   --  6.3  HGB 17.6* 15.5 15.4 13.3 12.4*  HCT 51.4 46.4 43.6 38.7* 36.5*  MCV 96.6 96.1 95.2 94.6 96.1  PLT 215 298 324 274 741   Basic Metabolic Panel:  Recent Labs Lab 01/16/17 1213 01/19/17 1210 01/19/17 1921 01/20/17 0614 01/20/17 0615 01/21/17 0542  NA 134* 134*  --   --  135 138  K 3.9 3.5  --   --  3.3* 3.4*  CL 101 98*  --   --  103 108  CO2 22 25  --   --  24 21*  GLUCOSE 115* 83  --   --  80 68  BUN 40* 17  --   --  12 11  CREATININE 1.91* 1.13 1.12  --  1.05 0.91  CALCIUM 9.6 9.3  --   --  8.2* 7.6*  MG  --   --   --  1.8  --  2.0   GFR: Estimated Creatinine Clearance: 87.6 mL/min (by C-G formula based on SCr of 0.91 mg/dL). Liver Function Tests:  Recent Labs Lab 01/16/17 1213 01/19/17 1210 01/20/17 0614 01/21/17 0542  AST 35 35 27 23  ALT 28 54 40 31  ALKPHOS 74 164* 176* 135*  BILITOT 0.6 1.7* 1.4* 0.9  PROT 7.2 6.8 5.9* 4.8*  ALBUMIN 3.8 3.1* 2.4* 2.2*    Recent Labs Lab 01/16/17 1213 01/19/17 1210 01/20/17 0614 01/21/17 1055  LIPASE 29 84* 99* 133*   No results for input(s): AMMONIA in the last 168 hours. Coagulation Profile: No results for input(s): INR, PROTIME in the last 168 hours. Cardiac Enzymes: No results for input(s): CKTOTAL, CKMB, CKMBINDEX, TROPONINI in the last 168 hours. BNP (last 3 results) No results for input(s): PROBNP in the last 8760 hours. HbA1C: No results for input(s): HGBA1C in the last 72 hours. CBG: No results for input(s): GLUCAP in the  last 168 hours. Lipid Profile: No results for input(s): CHOL, HDL, LDLCALC, TRIG, CHOLHDL, LDLDIRECT in the last 72 hours. Thyroid Function Tests: No results for input(s): TSH, T4TOTAL, FREET4, T3FREE, THYROIDAB in the last 72 hours. Anemia Panel: No results for input(s): VITAMINB12, FOLATE, FERRITIN, TIBC, IRON, RETICCTPCT in the last 72 hours. Sepsis Labs: No results for input(s): PROCALCITON, LATICACIDVEN in the last 168 hours.  Recent Results (from the past 240 hour(s))  Culture, blood (routine x 2)     Status: None (Preliminary result)   Collection Time: 01/19/17  7:08 PM  Result Value Ref Range Status   Specimen Description BLOOD LEFT ANTECUBITAL  Final   Special Requests   Final    BOTTLES DRAWN AEROBIC AND ANAEROBIC Blood Culture adequate volume   Culture NO GROWTH < 24 HOURS  Final   Report  Status PENDING  Incomplete  Culture, blood (routine x 2)     Status: None (Preliminary result)   Collection Time: 01/19/17  7:28 PM  Result Value Ref Range Status   Specimen Description BLOOD LEFT ARM  Final   Special Requests   Final    BOTTLES DRAWN AEROBIC AND ANAEROBIC Blood Culture adequate volume   Culture NO GROWTH < 24 HOURS  Final   Report Status PENDING  Incomplete  C difficile quick scan w PCR reflex     Status: None   Collection Time: 01/19/17  7:46 PM  Result Value Ref Range Status   C Diff antigen NEGATIVE NEGATIVE Final   C Diff toxin NEGATIVE NEGATIVE Final   C Diff interpretation No C. difficile detected.  Final  Gastrointestinal Panel by PCR , Stool     Status: Abnormal   Collection Time: 01/19/17  7:46 PM  Result Value Ref Range Status   Campylobacter species DETECTED (A) NOT DETECTED Final   Plesimonas shigelloides NOT DETECTED NOT DETECTED Final   Salmonella species NOT DETECTED NOT DETECTED Final   Yersinia enterocolitica NOT DETECTED NOT DETECTED Final   Vibrio species NOT DETECTED NOT DETECTED Final   Vibrio cholerae NOT DETECTED NOT DETECTED Final    Enteroaggregative E coli (EAEC) NOT DETECTED NOT DETECTED Final   Enterotoxigenic E coli (ETEC) NOT DETECTED NOT DETECTED Final   Shiga like toxin producing E coli (STEC) DETECTED (A) NOT DETECTED Final    Comment: RESULT CALLED TO, READ BACK BY AND VERIFIED WITH: IRECI OMOSEEBI ON 01/20/17 AT 1353 [place]    E. coli O157 NOT DETECTED NOT DETECTED Final   Shigella/Enteroinvasive E coli (EIEC) NOT DETECTED NOT DETECTED Final   Cryptosporidium NOT DETECTED NOT DETECTED Final   Cyclospora cayetanensis NOT DETECTED NOT DETECTED Final   Entamoeba histolytica NOT DETECTED NOT DETECTED Final   Giardia lamblia NOT DETECTED NOT DETECTED Final   Adenovirus F40/41 NOT DETECTED NOT DETECTED Final   Astrovirus NOT DETECTED NOT DETECTED Final   Norovirus GI/GII NOT DETECTED NOT DETECTED Final   Rotavirus A NOT DETECTED NOT DETECTED Final   Sapovirus (I, II, IV, and V) NOT DETECTED NOT DETECTED Final         Radiology Studies: US Abdomen Limited Ruq  Result Date: 01/19/2017 CLINICAL DATA:  Abdominal pain. EXAM: ULTRASOUND ABDOMEN LIMITED RIGHT UPPER QUADRANT COMPARISON:  CT scans August 2012 and January 16, 2017. Ultrasounds from March 2013 and September 2016 FINDINGS: Gallbladder: Stones and sludge are seen in the gallbladder with no wall thickening, pericholecystic fluid, or Murphy's sign. Common bile duct: Diameter: 6 mm Liver: An echogenic rounded mass is seen in the liver measuring up to 1.9 cm. No other abnormalities. IMPRESSION: 1. Stones and sludge are seen in the gallbladder with no wall thickening, pericholecystic fluid, or Murphy's sign. 2. A 1.9 cm mass is seen in the right hepatic lobe. This was not seen on previous studies. The finding is nonspecific but could represent a hemangioma. An MRI could further assess to exclude other possibilities since this abnormality was not seen previously. 3. The common bile duct is at the upper limits of normal. Recommend correlation with labs. Electronically Signed    By: Dorise Bullion III M.D   On: 01/19/2017 16:21        Scheduled Meds: . azithromycin  500 mg Oral Daily  . buPROPion  300 mg Oral QHS  . enoxaparin (LOVENOX) injection  40 mg Subcutaneous Q24H  . magic mouthwash w/lidocaine  10 mL Oral QID  . pantoprazole  40 mg Oral Q0600  . traZODone  100 mg Oral QHS   Continuous Infusions: . sodium chloride 0.9 % 1,000 mL with potassium chloride 40 mEq infusion       LOS: 2 days    Time spent: 35 minutes.    Irine Seal, MD [facility] Pager [phone]  If 7PM-7AM, please contact night-coverage [email or web address] Password [email or web address] 01/21/2017, 1:34 PM

## 2017-01-22 DIAGNOSIS — A4151 Sepsis due to Escherichia coli [E. coli]: Secondary | ICD-10-CM

## 2017-01-22 LAB — COMPREHENSIVE METABOLIC PANEL
ALK PHOS: 184 U/L — AB (ref 38–126)
ALT: 47 U/L (ref 17–63)
ANION GAP: 7 (ref 5–15)
AST: 48 U/L — ABNORMAL HIGH (ref 15–41)
Albumin: 2.4 g/dL — ABNORMAL LOW (ref 3.5–5.0)
BILIRUBIN TOTAL: 0.7 mg/dL (ref 0.3–1.2)
BUN: 7 mg/dL (ref 6–20)
CALCIUM: 8.1 mg/dL — AB (ref 8.9–10.3)
CO2: 24 mmol/L (ref 22–32)
CREATININE: 0.9 mg/dL (ref 0.61–1.24)
Chloride: 109 mmol/L (ref 101–111)
Glucose, Bld: 95 mg/dL (ref 65–99)
Potassium: 3.9 mmol/L (ref 3.5–5.1)
SODIUM: 140 mmol/L (ref 135–145)
TOTAL PROTEIN: 5.6 g/dL — AB (ref 6.5–8.1)

## 2017-01-22 LAB — CBC WITH DIFFERENTIAL/PLATELET
BASOS ABS: 0 10*3/uL (ref 0.0–0.1)
BASOS PCT: 0 %
EOS ABS: 0.1 10*3/uL (ref 0.0–0.7)
Eosinophils Relative: 1 %
HCT: 39 % (ref 39.0–52.0)
HEMOGLOBIN: 13.2 g/dL (ref 13.0–17.0)
LYMPHS PCT: 57 %
Lymphs Abs: 6.4 10*3/uL — ABNORMAL HIGH (ref 0.7–4.0)
MCH: 32.2 pg (ref 26.0–34.0)
MCHC: 33.8 g/dL (ref 30.0–36.0)
MCV: 95.1 fL (ref 78.0–100.0)
MONO ABS: 0.2 10*3/uL (ref 0.1–1.0)
Monocytes Relative: 2 %
NEUTROS PCT: 40 %
Neutro Abs: 4.5 10*3/uL (ref 1.7–7.7)
PLATELETS: 350 10*3/uL (ref 150–400)
RBC: 4.1 MIL/uL — AB (ref 4.22–5.81)
RDW: 12.4 % (ref 11.5–15.5)
WBC: 11.2 10*3/uL — AB (ref 4.0–10.5)

## 2017-01-22 LAB — PATHOLOGIST SMEAR REVIEW

## 2017-01-22 LAB — MAGNESIUM: MAGNESIUM: 1.9 mg/dL (ref 1.7–2.4)

## 2017-01-22 MED ORDER — SACCHAROMYCES BOULARDII 250 MG PO CAPS
250.0000 mg | ORAL_CAPSULE | Freq: Two times a day (BID) | ORAL | Status: DC
  Administered 2017-01-22 – 2017-01-23 (×3): 250 mg via ORAL
  Filled 2017-01-22 (×3): qty 1

## 2017-01-22 NOTE — Care Management Important Message (Signed)
Important Message  Patient Details  Name: Jonathon Parker MRN: [id number] Date of Birth: [birth year]   Medicare Important Message Given:  Yes    Shail Urbas Abena 01/22/2017, 10:19 AM

## 2017-01-22 NOTE — Progress Notes (Signed)
PROGRESS NOTE    Jonathon Parker  YPP:[id number] DOB: 11/20/1950 DOA: 01/19/2017 PCP: Nicoletta Dress, MD    Brief Narrative:  Patient is a 66 year old gentleman history of depression, gastroesophageal reflux disease presented to the ED with diffuse abdominal pain, nausea, dry heaves, watery numerous stools for the past 5-7 days. C. difficile PCR negative. GI pathogen panel consistent with a Shiga toxin producing Escherichia coli and campylobacter diarrhea. Patient also noted to have elevated bilirubin levels, elevated lipase levels, right upper quadrant ultrasound with gallstones and gallbladder sludge. GI consulted.   Assessment & Plan:   Principal Problem:   Sepsis ([place]) Active Problems:   Gastroenteritis   GERD (gastroesophageal reflux disease)   Depression   Campylobacter diarrhea   Diarrhea of infectious origin   STEC (Shiga toxin-producing Escherichia coli)   Elevated LFTs   Hypokalemia  #1 sepsis secondary to campylobacter diarrhea and Shiga toxin producing Escherichia coli Patient presented with copious amounts of diarrhea for greater than a week with a significant leukocytosis noted as well as fever. C. difficile PCR was negative. GI pathogen panel consistent with campylobacter diarrhea and Shiga toxin producing Escherichia coli. Patient started on azithromycin 3 days. Discontinued oral vancomycin.  Stopping IV fluids. Addressing diet to soft diet, lactose-free. Will monitor for adequate oral intake. Probiotics also added. Antiemetics. Pain management. Supportive care. GI consulted.  #2 elevated LFTs/hyperbilirubinemia/? Chemical Pancreatitis Patient noted to have elevated LFTs, lipase levels elevated, right upper quadrant ultrasound with gallstones and sludge noted. Concern for possible acute clinical pancreatitis versus chemical pancreatitis. Patient denies any significant epigastric pain and has no further nausea or emesis. Patient tolerating current diet. Outpatient  MRI MRCP if LFTs don't normalize per GI recommendations. Appreciate GI input.  #3 gastroesophageal reflux disease PPI.  #4 hypokalemia secondary to GI losses. Monitor .  #5 depression Continue home regimen of Wellbutrin.  #6 dehydration Currently resolved. Monitor.  #7 hepatic lesion Likely hemangioma. MRI as outpatient for further evaluation.    DVT prophylaxis: Lovenox Code Status: Full Family Communication: Updated patient and family at bedside. Disposition Plan: Home once acute diarrhea issues have improved with no further abdominal pain and tolerating oral intake and able to stay hydrated.   Consultants:   Gastroenterology  Procedures:   Right upper quadrant abdominal ultrasound 01/19/2017  Antimicrobials:   Azithromycin 3 days 01/20/2017  Oral Vancomycin 01/19/2017>>>> 01/20/2017   Subjective: Bowel movements held reduced. Patient is afraid of eating with concern for diarrhea. No abdominal pain. No nausea no vomiting. No fever no chills.  Objective: Vitals:   01/21/17 1426 01/21/17 2243 01/22/17 0651 01/22/17 1518  BP: (!) 143/78 (!) 142/64 (!) 156/79 (!) 149/90  Pulse: 65 79 (!) 59 (!) 59  Resp: 17 18 17 16   Temp: 98.1 F (36.7 C) 99.2 F (37.3 C) 98.4 F (36.9 C) 98.7 F (37.1 C)  TempSrc: Oral   Oral  SpO2: 97% 90% 98% 99%  Weight:      Height:        Intake/Output Summary (Last 24 hours) at 01/22/17 1837 Last data filed at 01/22/17 4580  Gross per 24 hour  Intake          2165.83 ml  Output                1 ml  Net          2164.83 ml   Filed Weights   01/19/17 1209 01/19/17 1842  Weight: 78.9 kg (174 lb) 84.1 kg (185 lb 4.8  oz)    Examination:  General exam: Appears calm and comfortable  Respiratory system: Clear to auscultation. Respiratory effort normal. Cardiovascular system: S1 & S2 heard, RRR. No JVD, murmurs, rubs, gallops or clicks. No pedal edema. Gastrointestinal system: Abdomen is nondistended, soft and noTenderness to  palpation. No organomegaly or masses felt. Normal bowel sounds heard. Central nervous system: Alert and oriented. No focal neurological deficits. Extremities: Symmetric 5 x 5 power. Skin: No rashes, lesions or ulcers Psychiatry: Judgement and insight appear normal. Mood & affect appropriate.     Data Reviewed: I have personally reviewed following labs and imaging studies  CBC:  Recent Labs Lab 01/19/17 1210 01/19/17 1921 01/20/17 0615 01/21/17 0542 01/22/17 0858  WBC 19.5* 23.1* 18.0* 13.1* 11.2*  NEUTROABS  --   --   --  6.3 4.5  HGB 15.5 15.4 13.3 12.4* 13.2  HCT 46.4 43.6 38.7* 36.5* 39.0  MCV 96.1 95.2 94.6 96.1 95.1  PLT 298 324 274 276 413   Basic Metabolic Panel:  Recent Labs Lab 01/16/17 1213 01/19/17 1210 01/19/17 1921 01/20/17 0614 01/20/17 0615 01/21/17 0542 01/22/17 0858  NA 134* 134*  --   --  135 138 140  K 3.9 3.5  --   --  3.3* 3.4* 3.9  CL 101 98*  --   --  103 108 109  CO2 22 25  --   --  24 21* 24  GLUCOSE 115* 83  --   --  80 68 95  BUN 40* 17  --   --  12 11 7   CREATININE 1.91* 1.13 1.12  --  1.05 0.91 0.90  CALCIUM 9.6 9.3  --   --  8.2* 7.6* 8.1*  MG  --   --   --  1.8  --  2.0 1.9   GFR: Estimated Creatinine Clearance: 88.6 mL/min (by C-G formula based on SCr of 0.9 mg/dL). Liver Function Tests:  Recent Labs Lab 01/16/17 1213 01/19/17 1210 01/20/17 0614 01/21/17 0542 01/22/17 0858  AST 35 35 27 23 48*  ALT 28 54 40 31 47  ALKPHOS 74 164* 176* 135* 184*  BILITOT 0.6 1.7* 1.4* 0.9 0.7  PROT 7.2 6.8 5.9* 4.8* 5.6*  ALBUMIN 3.8 3.1* 2.4* 2.2* 2.4*    Recent Labs Lab 01/16/17 1213 01/19/17 1210 01/20/17 0614 01/21/17 1055  LIPASE 29 84* 99* 133*   No results for input(s): AMMONIA in the last 168 hours. Coagulation Profile: No results for input(s): INR, PROTIME in the last 168 hours. Cardiac Enzymes: No results for input(s): CKTOTAL, CKMB, CKMBINDEX, TROPONINI in the last 168 hours. BNP (last 3 results) No results for  input(s): PROBNP in the last 8760 hours. HbA1C: No results for input(s): HGBA1C in the last 72 hours. CBG: No results for input(s): GLUCAP in the last 168 hours. Lipid Profile: No results for input(s): CHOL, HDL, LDLCALC, TRIG, CHOLHDL, LDLDIRECT in the last 72 hours. Thyroid Function Tests: No results for input(s): TSH, T4TOTAL, FREET4, T3FREE, THYROIDAB in the last 72 hours. Anemia Panel: No results for input(s): VITAMINB12, FOLATE, FERRITIN, TIBC, IRON, RETICCTPCT in the last 72 hours. Sepsis Labs: No results for input(s): PROCALCITON, LATICACIDVEN in the last 168 hours.  Recent Results (from the past 240 hour(s))  Culture, blood (routine x 2)     Status: None (Preliminary result)   Collection Time: 01/19/17  7:08 PM  Result Value Ref Range Status   Specimen Description BLOOD LEFT ANTECUBITAL  Final   Special Requests   Final  BOTTLES DRAWN AEROBIC AND ANAEROBIC Blood Culture adequate volume   Culture NO GROWTH 3 DAYS  Final   Report Status PENDING  Incomplete  Culture, blood (routine x 2)     Status: None (Preliminary result)   Collection Time: 01/19/17  7:28 PM  Result Value Ref Range Status   Specimen Description BLOOD LEFT ARM  Final   Special Requests   Final    BOTTLES DRAWN AEROBIC AND ANAEROBIC Blood Culture adequate volume   Culture NO GROWTH 3 DAYS  Final   Report Status PENDING  Incomplete  C difficile quick scan w PCR reflex     Status: None   Collection Time: 01/19/17  7:46 PM  Result Value Ref Range Status   C Diff antigen NEGATIVE NEGATIVE Final   C Diff toxin NEGATIVE NEGATIVE Final   C Diff interpretation No C. difficile detected.  Final  Gastrointestinal Panel by PCR , Stool     Status: Abnormal   Collection Time: 01/19/17  7:46 PM  Result Value Ref Range Status   Campylobacter species DETECTED (A) NOT DETECTED Final   Plesimonas shigelloides NOT DETECTED NOT DETECTED Final   Salmonella species NOT DETECTED NOT DETECTED Final   Yersinia enterocolitica  NOT DETECTED NOT DETECTED Final   Vibrio species NOT DETECTED NOT DETECTED Final   Vibrio cholerae NOT DETECTED NOT DETECTED Final   Enteroaggregative E coli (EAEC) NOT DETECTED NOT DETECTED Final   Enterotoxigenic E coli (ETEC) NOT DETECTED NOT DETECTED Final   Shiga like toxin producing E coli (STEC) DETECTED (A) NOT DETECTED Final    Comment: RESULT CALLED TO, READ BACK BY AND VERIFIED WITH: IRECI OMOSEEBI ON 01/20/17 AT 1353 [place]    E. coli O157 NOT DETECTED NOT DETECTED Final   Shigella/Enteroinvasive E coli (EIEC) NOT DETECTED NOT DETECTED Final   Cryptosporidium NOT DETECTED NOT DETECTED Final   Cyclospora cayetanensis NOT DETECTED NOT DETECTED Final   Entamoeba histolytica NOT DETECTED NOT DETECTED Final   Giardia lamblia NOT DETECTED NOT DETECTED Final   Adenovirus F40/41 NOT DETECTED NOT DETECTED Final   Astrovirus NOT DETECTED NOT DETECTED Final   Norovirus GI/GII NOT DETECTED NOT DETECTED Final   Rotavirus A NOT DETECTED NOT DETECTED Final   Sapovirus (I, II, IV, and V) NOT DETECTED NOT DETECTED Final         Radiology Studies: No results found.      Scheduled Meds: . buPROPion  300 mg Oral QHS  . enoxaparin (LOVENOX) injection  40 mg Subcutaneous Q24H  . magic mouthwash w/lidocaine  10 mL Oral QID  . pantoprazole  40 mg Oral Q0600  . saccharomyces boulardii  250 mg Oral BID  . traZODone  100 mg Oral QHS   Continuous Infusions: . 0.9 % NaCl with KCl 40 mEq / L 125 mL/hr (01/22/17 1755)     LOS: 3 days    Time spent: 35 minutes.   Author:  Berle Mull, MD [facility] Pager: [phone] 01/22/2017 6:39 PM

## 2017-01-23 LAB — CBC WITH DIFFERENTIAL/PLATELET
Basophils Absolute: 0 10*3/uL (ref 0.0–0.1)
Basophils Relative: 0 %
EOS ABS: 0.1 10*3/uL (ref 0.0–0.7)
Eosinophils Relative: 1 %
HCT: 36.8 % — ABNORMAL LOW (ref 39.0–52.0)
HEMOGLOBIN: 12.4 g/dL — AB (ref 13.0–17.0)
Lymphocytes Relative: 58 %
Lymphs Abs: 5.8 10*3/uL — ABNORMAL HIGH (ref 0.7–4.0)
MCH: 32.1 pg (ref 26.0–34.0)
MCHC: 33.7 g/dL (ref 30.0–36.0)
MCV: 95.3 fL (ref 78.0–100.0)
MONOS PCT: 3 %
Monocytes Absolute: 0.3 10*3/uL (ref 0.1–1.0)
NEUTROS PCT: 38 %
Neutro Abs: 3.8 10*3/uL (ref 1.7–7.7)
Platelets: 331 10*3/uL (ref 150–400)
RBC: 3.86 MIL/uL — AB (ref 4.22–5.81)
RDW: 12.5 % (ref 11.5–15.5)
WBC: 10 10*3/uL (ref 4.0–10.5)

## 2017-01-23 LAB — COMPREHENSIVE METABOLIC PANEL
ALBUMIN: 2.3 g/dL — AB (ref 3.5–5.0)
ALK PHOS: 174 U/L — AB (ref 38–126)
ALT: 56 U/L (ref 17–63)
ANION GAP: 4 — AB (ref 5–15)
AST: 58 U/L — ABNORMAL HIGH (ref 15–41)
BUN: 5 mg/dL — ABNORMAL LOW (ref 6–20)
CALCIUM: 7.9 mg/dL — AB (ref 8.9–10.3)
CHLORIDE: 110 mmol/L (ref 101–111)
CO2: 25 mmol/L (ref 22–32)
Creatinine, Ser: 0.78 mg/dL (ref 0.61–1.24)
GFR calc Af Amer: >60 mL/min (ref >60)
GFR calc non Af Amer: >60 mL/min (ref >60)
GLUCOSE: 79 mg/dL (ref 65–99)
POTASSIUM: 4.1 mmol/L (ref 3.5–5.1)
SODIUM: 139 mmol/L (ref 135–145)
Total Bilirubin: 0.8 mg/dL (ref 0.3–1.2)
Total Protein: 4.9 g/dL — ABNORMAL LOW (ref 6.5–8.1)

## 2017-01-23 LAB — MAGNESIUM: Magnesium: 1.7 mg/dL (ref 1.7–2.4)

## 2017-01-23 MED ORDER — SACCHAROMYCES BOULARDII 250 MG PO CAPS: 250.0000 mg | ORAL_CAPSULE | Freq: Two times a day (BID) | ORAL | 0 refills | Status: DC

## 2017-01-23 MED ORDER — MAGIC MOUTHWASH W/LIDOCAINE: 10.0000 mL | Freq: Four times a day (QID) | ORAL | 0 refills | Status: AC

## 2017-01-23 NOTE — Discharge Instructions (Signed)
Food Choices to Help Relieve Diarrhea, Adult When you have diarrhea, the foods you eat and your eating habits are very important. Choosing the right foods and drinks can help:  Relieve diarrhea.  Replace lost fluids and nutrients.  Prevent dehydration.  What general guidelines should I follow? Relieving diarrhea  Choose foods with less than 2 g or .07 oz. of fiber per serving.  Limit fats to less than 8 tsp (38 g or 1.34 oz.) a day.  Avoid the following: ? Foods and beverages sweetened with high-fructose corn syrup, honey, or sugar alcohols such as xylitol, sorbitol, and mannitol. ? Foods that contain a lot of fat or sugar. ? Fried, greasy, or spicy foods. ? High-fiber grains, breads, and cereals. ? Raw fruits and vegetables.  Eat foods that are rich in probiotics. These foods include dairy products such as yogurt and fermented milk products. They help increase healthy bacteria in the stomach and intestines (gastrointestinal tract, or GI tract).  If you have lactose intolerance, avoid dairy products. These may make your diarrhea worse.  Take medicine to help stop diarrhea (antidiarrheal medicine) only as told by your health care provider. Replacing nutrients  Eat small meals or snacks every 3-4 hours.  Eat bland foods, such as white rice, toast, or baked potato, until your diarrhea starts to get better. Gradually reintroduce nutrient-rich foods as tolerated or as told by your health care provider. This includes: ? Well-cooked protein foods. ? Peeled, seeded, and soft-cooked fruits and vegetables. ? Low-fat dairy products.  Take vitamin and mineral supplements as told by your health care provider. Preventing dehydration   Start by sipping water or a special solution to prevent dehydration (oral rehydration solution, ORS). Urine that is clear or pale yellow means that you are getting enough fluid.  Try to drink at least 8-10 cups of fluid each day to help replace lost  fluids.  You may add other liquids in addition to water, such as clear juice or decaffeinated sports drinks, as tolerated or as told by your health care provider.  Avoid drinks with caffeine, such as coffee, tea, or soft drinks.  Avoid alcohol. What foods are recommended? The items listed may not be a complete list. Talk with your health care provider about what dietary choices are best for you. Grains White rice. White, French, or pita breads (fresh or toasted), including plain rolls, buns, or bagels. White pasta. Saltine, soda, or graham crackers. Pretzels. Low-fiber cereal. Cooked cereals made with water (such as cornmeal, farina, or cream cereals). Plain muffins. Matzo. Melba toast. Zwieback. Vegetables Potatoes (without the skin). Most well-cooked and canned vegetables without skins or seeds. Tender lettuce. Fruits Apple sauce. Fruits canned in juice. Cooked apricots, cherries, grapefruit, peaches, pears, or plums. Fresh bananas and cantaloupe. Meats and other protein foods Baked or boiled chicken. Eggs. Tofu. Fish. Seafood. Smooth nut butters. Ground or well-cooked tender beef, ham, veal, lamb, pork, or poultry. Dairy Plain yogurt, kefir, and unsweetened liquid yogurt. Lactose-free milk, buttermilk, skim milk, or soy milk. Low-fat or nonfat hard cheese. Beverages Water. Low-calorie sports drinks. Fruit juices without pulp. Strained tomato and vegetable juices. Decaffeinated teas. Sugar-free beverages not sweetened with sugar alcohols. Oral rehydration solutions, if approved by your health care provider. Seasoning and other foods Bouillon, broth, or soups made from recommended foods. What foods are not recommended? The items listed may not be a complete list. Talk with your health care provider about what dietary choices are best for you. Grains Whole grain, whole wheat,   bran, or rye breads, rolls, pastas, and crackers. Wild or brown rice. Whole grain or bran cereals. Barley. Oats and  oatmeal. Corn tortillas or taco shells. Granola. Popcorn. Vegetables Raw vegetables. Fried vegetables. Cabbage, broccoli, Brussels sprouts, artichokes, baked beans, beet greens, corn, kale, legumes, peas, sweet potatoes, and yams. Potato skins. Cooked spinach and cabbage. Fruits Dried fruit, including raisins and dates. Raw fruits. Stewed or dried prunes. Canned fruits with syrup. Meat and other protein foods Fried or fatty meats. Deli meats. Chunky nut butters. Nuts and seeds. Beans and lentils. Bacon. Hot dogs. Sausage. Dairy High-fat cheeses. Whole milk, chocolate milk, and beverages made with milk, such as milk shakes. Half-and-half. Cream. sour cream. Ice cream. Beverages Caffeinated beverages (such as coffee, tea, soda, or energy drinks). Alcoholic beverages. Fruit juices with pulp. Prune juice. Soft drinks sweetened with high-fructose corn syrup or sugar alcohols. High-calorie sports drinks. Fats and oils Butter. Cream sauces. Margarine. Salad oils. Plain salad dressings. Olives. Avocados. Mayonnaise. Sweets and desserts Sweet rolls, doughnuts, and sweet breads. Sugar-free desserts sweetened with sugar alcohols such as xylitol and sorbitol. Seasoning and other foods Honey. Hot sauce. Chili powder. Gravy. Cream-based or milk-based soups. Pancakes and waffles. Summary  When you have diarrhea, the foods you eat and your eating habits are very important.  Make sure you get at least 8-10 cups of fluid each day, or enough to keep your urine clear or pale yellow.  Eat bland foods and gradually reintroduce healthy, nutrient-rich foods as tolerated, or as told by your health care provider.  Avoid high-fiber, fried, greasy, or spicy foods. This information is not intended to replace advice given to you by your health care provider. Make sure you discuss any questions you have with your health care provider. Document Released: 10/12/2003 Document Revised: 07/19/2016 Document Reviewed:  07/19/2016 Elsevier Interactive Patient Education  2017 [employer]  

## 2017-01-23 NOTE — Progress Notes (Signed)
[facility] Levels to be D/C'd Home per MD order.  Discussed with the patient and all questions fully answered.  VSS, Skin clean, dry and intact without evidence of skin break down, no evidence of skin tears noted. IV catheter discontinued intact. Site without signs and symptoms of complications. Dressing and pressure applied.  An After Visit Summary was printed and given to the patient. Patient received prescription.  D/c education completed with patient/family including follow up instructions, medication list, d/c activities limitations if indicated, with other d/c instructions as indicated by MD - patient able to verbalize understanding, all questions fully answered.   Patient instructed to return to ED, call 911, or call MD for any changes in condition.   Patient escorted via [place], and D/C home via private auto.  Christoper Fabian Euna Armon 01/23/2017 11:38 AM

## 2017-01-23 NOTE — Care Management Note (Signed)
Case Management Note  Patient Details  Name: Jonathon Parker MRN: [id number] Date of Birth: July 13, 1951  Subjective/Objective:      Admitted with sepsis.              Action/Plan: Plan is to d/c to home today. No needs identified per CM.  Expected Discharge Date:  01/23/17               Expected Discharge Plan:  Home/Self Care  In-House Referral:     Discharge planning Services  CM Consult  Post Acute Care Choice:    Choice offered to:     Status of Service:  Completed, signed off  If discussed at [place] of Stay Meetings, dates discussed:    Additional Comments:  Sharin Mons, RN 01/23/2017, 12:11 PM

## 2017-01-24 LAB — CULTURE, BLOOD (ROUTINE X 2)
CULTURE: NO GROWTH
CULTURE: NO GROWTH
SPECIAL REQUESTS: ADEQUATE
SPECIAL REQUESTS: ADEQUATE

## 2017-01-27 NOTE — Discharge Summary (Signed)
[facility] Discharge Summary   Patient: Jonathon Parker NGE:[id number]   PCP: Nicoletta Dress, MD DOB: 14-May-1951   Date of admission: 01/19/2017   Date of discharge: 01/23/2017    Discharge Diagnoses:  Principal Problem:   Sepsis ([place]) Active Problems:   Gastroenteritis   GERD (gastroesophageal reflux disease)   Depression   Campylobacter diarrhea   Diarrhea of infectious origin   STEC (Shiga toxin-producing Escherichia coli)   Elevated LFTs   Hypokalemia   Admitted From: home Disposition:  home  Recommendations for Outpatient Follow-up:  1. Please follow up with PCP in 1 week    Follow-up Information    Nicoletta Dress, MD. Schedule an appointment as soon as possible for a visit in 1 week(s).   Specialty:  Internal Medicine Contact information: [address] [place] [address] [phone]        Jackquline Denmark, MD. Schedule an appointment as soon as possible for a visit in 1 month(s).   Specialties:  Internal Medicine, Gastroenterology Why:  Hepatic MRI as outpatient with Dr. Carmell Austria.  Contact information: [place] [address] [phone]          Diet recommendation: cardiac diet  Activity: The patient is advised to gradually reintroduce usual activities.  Discharge Condition: good  Code Status: full code  History of present illness: As per the H and P dictated on admission, "Omran Keelin is a 66 y.o. male with medical history significant of GERD, depression who presented to the ED for evaluation of the above noted complaints. Per history obtained, approximately 1 week back, patient started having fever, after 2 days the fever resolved, patient then started having profuse loose watery diarrhea-up to 12 episodes during the day. He was seen in the emergency room on 6/14 for this issue, and given IV fluids, supportive care and discharged home. Unfortunately he continues to feel very fatigued, he has started  to develop diffuse abdominal pain and diffuse myalgias as well. His diarrhea continues, he has had approximately 3 loose stools earlier today, but none since then. He also complains of mostly nausea and dry heaving over the past few days and has not been able to drink/eat.  Due to persistence of the above noted symptoms, he presented back to the emergency room, where he was found to have a minimally elevated lipase, a RUQ ultrasound demonstrated stones/sludge in the gallbladder, a 1.9 cm mass was also seen in the right lower lobe, per ultrasound report this is nonspecific and could represent a hemangioma.  Patient was given IV fluids and other supportive measures, and the hospitalist service was asked to admit this patient for further evaluation and treatment."  Hospital Course:  Summary of his active problems in the hospital is as following. #1 sepsis secondary to campylobacter diarrhea and Shiga toxin producing Escherichia coli Patient presented with copious amounts of diarrhea for greater than a week with a significant leukocytosis noted as well as fever.  C. difficile PCR was negative.  GI pathogen panel with campylobacter diarrhea and Shiga toxin producing Escherichia coli.  Patient started on azithromycin 3 days. Discontinued oral vancomycin.  Given IV fluids. Recommend soft diet, lactose-free.  Tolerating well Probiotics also added.  #2 elevated LFTs/hyperbilirubinemia/? Chemical Pancreatitis Patient noted to have elevated LFTs, lipase levels elevated, right upper quadrant ultrasound with gallstones and sludge noted. Concern for possible acute clinical pancreatitis versus chemical pancreatitis. Patient denies any significant epigastric pain and has no further nausea or emesis.  Patient tolerating current diet. Outpatient MRI MRCP if LFTs don't normalize per GI recommendations. Appreciate GI input.  #3 gastroesophageal reflux disease PPI.  #4 hypokalemia secondary to GI  losses.  #5 depression Continue home regimen of Wellbutrin.  #6 dehydration Currently resolved. Monitor.  #7 hepatic lesion Likely hemangioma. MRI as outpatient for further evaluation.   All other chronic medical condition were stable during the hospitalization.  Patient was seen by physical therapy, who recommended home health, which was arranged by Education officer, museum and case Freight forwarder. On the day of the discharge the patient's vitals were stable, and no other acute medical condition were reported by patient. the patient was felt safe to be discharge at home with home health.  Procedures and Results:  none   Consultations:  Gastroenterology  DISCHARGE MEDICATION: Discharge Medication List as of 01/23/2017 11:20 AM    START taking these medications   Details  magic mouthwash w/lidocaine SOLN Take 10 mLs by mouth 4 (four) times daily., Starting Thu 01/23/2017, Until Sun 01/26/2017, Print    saccharomyces boulardii (FLORASTOR) 250 MG capsule Take 1 capsule (250 mg total) by mouth 2 (two) times daily., Starting Thu 01/23/2017, Normal      CONTINUE these medications which have NOT CHANGED   Details  buPROPion (WELLBUTRIN XL) 300 MG 24 hr tablet Take 300 mg by mouth at bedtime., Historical Med    esomeprazole (NEXIUM) 40 MG capsule Take 40 mg by mouth daily at 12 noon., Historical Med    HYDROcodone-acetaminophen (NORCO/VICODIN) 5-325 MG tablet Take 2 tablets by mouth every 4 (four) hours as needed., Starting Thu 01/16/2017, Print    ibuprofen (ADVIL,MOTRIN) 200 MG tablet Take 800 mg by mouth every 6 (six) hours as needed for fever or mild pain., Historical Med    ondansetron (ZOFRAN ODT) 4 MG disintegrating tablet Take 1 tablet (4 mg total) by mouth every 8 (eight) hours as needed for nausea., Starting Thu 01/16/2017, Print    traZODone (DESYREL) 100 MG tablet Take 100 mg by mouth at bedtime., Historical Med      STOP taking these medications     diphenoxylate-atropine (LOMOTIL)  2.5-0.025 MG tablet        No Known Allergies Discharge Instructions    Diet - low sodium heart healthy    Complete by:  As directed    Discharge instructions    Complete by:  As directed    It is important that you read following instructions as well as go over your medication list with RN to help you understand your care after this hospitalization.  Discharge Instructions: Please follow-up with PCP in one week  Please request your primary care physician to go over all Hospital Tests and Procedure/Radiological results at the follow up,  Please get all Hospital records sent to your PCP by signing hospital release before you go home.   Do not take more than prescribed Pain, Sleep and Anxiety Medications. You were cared for by a hospitalist during your hospital stay. If you have any questions about your discharge medications or the care you received while you were in the hospital after you are discharged, you can call the unit and ask to speak with the hospitalist on call if the hospitalist that took care of you is not available.  Once you are discharged, your primary care physician will handle any further medical issues. Please note that NO REFILLS for any discharge medications will be authorized once you are discharged, as it is imperative that you return to  your primary care physician (or establish a relationship with a primary care physician if you do not have one) for your aftercare needs so that they can reassess your need for medications and monitor your lab values. You Must read complete instructions/literature along with all the possible adverse reactions/side effects for all the Medicines you take and that have been prescribed to you. Take any new Medicines after you have completely understood and accept all the possible adverse reactions/side effects. Wear Seat belts while driving. If you have smoked or chewed Tobacco in the last 2 yrs please stop smoking and/or stop any Recreational  drug use.   Increase activity slowly    Complete by:  As directed      Discharge Exam: Filed Weights   01/19/17 1209 01/19/17 1842  Weight: 78.9 kg (174 lb) 84.1 kg (185 lb 4.8 oz)   Vitals:   01/22/17 2139 01/23/17 0521  BP: (!) 163/77 (!) 156/87  Pulse: 61 61  Resp: 17 18  Temp: 97.9 F (36.6 C) 98.8 F (37.1 C)   General: Appear in no distress, no Rash; Oral Mucosa moist. Cardiovascular: S1 and S2 Present, no Murmur, no JVD Respiratory: Bilateral Air entry present and Clear to Auscultation, no Crackles, no wheezes Abdomen: Bowel Sound present, Soft and no tenderness Extremities: no Pedal edema, no calf tenderness Neurology: Grossly no focal neuro deficit.  The results of significant diagnostics from this hospitalization (including imaging, microbiology, ancillary and laboratory) are listed below for reference.    Significant Diagnostic Studies: Ct Abdomen Pelvis W Contrast  Result Date: 01/16/2017 CLINICAL DATA:  Nausea, vomiting and diarrhea. Prior colon surgery and hernia repair. EXAM: CT ABDOMEN AND PELVIS WITH CONTRAST TECHNIQUE: Multidetector CT imaging of the abdomen and pelvis was performed using the standard protocol following bolus administration of intravenous contrast. CONTRAST:  75mL ISOVUE-300 IOPAMIDOL (ISOVUE-300) INJECTION 61% COMPARISON:  None. FINDINGS: Lower chest: Normal size cardiac chambers with bibasilar dependent atelectasis. Hepatobiliary: No focal liver abnormality is seen. No gallstones, gallbladder wall thickening, or biliary dilatation. Pancreas: Unremarkable. No pancreatic ductal dilatation or surrounding inflammatory changes. Spleen: Normal in size without focal abnormality. Adrenals/Urinary Tract: Adrenal glands are unremarkable. Kidneys are normal, without renal calculi, focal lesion, or hydronephrosis. Bladder is unremarkable. Stomach/Bowel: The stomach is contracted in appearance. There is normal small bowel rotation without inflammation or  obstruction. Sutures are seen at the base of the cecum consistent prior appendectomy. The appendix not visualized. Move liquid stool noted within the colon which may reflect diarrheal disease. Sutures are noted along the distal rectum. No large bowel obstruction is noted. Vascular/Lymphatic: No significant vascular findings are present. No enlarged abdominal or pelvic lymph nodes. Reproductive: Prostate is unremarkable. Other: Suture material along the lower ventral pelvis are extending to the pubic symphysis from presumed prior hernia repair possibly a sports hernia given location. Musculoskeletal: Degenerative disc disease L5-S1. No acute nor suspicious osseous lesions. IMPRESSION: 1. No bowel obstruction or acute inflammation. Liquid stool in the rectum may reflect diarrheal disease. 2. Degenerative disc disease L5-S1. Electronically Signed   By: Ashley Royalty M.D.   On: 01/16/2017 15:16   US Abdomen Limited Ruq  Result Date: 01/19/2017 CLINICAL DATA:  Abdominal pain. EXAM: ULTRASOUND ABDOMEN LIMITED RIGHT UPPER QUADRANT COMPARISON:  CT scans August 2012 and January 16, 2017. Ultrasounds from March 2013 and September 2016 FINDINGS: Gallbladder: Stones and sludge are seen in the gallbladder with no wall thickening, pericholecystic fluid, or Murphy's sign. Common bile duct: Diameter: 6 mm Liver: An echogenic  rounded mass is seen in the liver measuring up to 1.9 cm. No other abnormalities. IMPRESSION: 1. Stones and sludge are seen in the gallbladder with no wall thickening, pericholecystic fluid, or Murphy's sign. 2. A 1.9 cm mass is seen in the right hepatic lobe. This was not seen on previous studies. The finding is nonspecific but could represent a hemangioma. An MRI could further assess to exclude other possibilities since this abnormality was not seen previously. 3. The common bile duct is at the upper limits of normal. Recommend correlation with labs. Electronically Signed   By: Dorise Bullion III M.D   On:  01/19/2017 16:21    Microbiology: Recent Results (from the past 240 hour(s))  Culture, blood (routine x 2)     Status: None   Collection Time: 01/19/17  7:08 PM  Result Value Ref Range Status   Specimen Description BLOOD LEFT ANTECUBITAL  Final   Special Requests   Final    BOTTLES DRAWN AEROBIC AND ANAEROBIC Blood Culture adequate volume   Culture NO GROWTH 5 DAYS  Final   Report Status 01/24/2017 FINAL  Final  Culture, blood (routine x 2)     Status: None   Collection Time: 01/19/17  7:28 PM  Result Value Ref Range Status   Specimen Description BLOOD LEFT ARM  Final   Special Requests   Final    BOTTLES DRAWN AEROBIC AND ANAEROBIC Blood Culture adequate volume   Culture NO GROWTH 5 DAYS  Final   Report Status 01/24/2017 FINAL  Final  C difficile quick scan w PCR reflex     Status: None   Collection Time: 01/19/17  7:46 PM  Result Value Ref Range Status   C Diff antigen NEGATIVE NEGATIVE Final   C Diff toxin NEGATIVE NEGATIVE Final   C Diff interpretation No C. difficile detected.  Final  Gastrointestinal Panel by PCR , Stool     Status: Abnormal   Collection Time: 01/19/17  7:46 PM  Result Value Ref Range Status   Campylobacter species DETECTED (A) NOT DETECTED Final   Plesimonas shigelloides NOT DETECTED NOT DETECTED Final   Salmonella species NOT DETECTED NOT DETECTED Final   Yersinia enterocolitica NOT DETECTED NOT DETECTED Final   Vibrio species NOT DETECTED NOT DETECTED Final   Vibrio cholerae NOT DETECTED NOT DETECTED Final   Enteroaggregative E coli (EAEC) NOT DETECTED NOT DETECTED Final   Enterotoxigenic E coli (ETEC) NOT DETECTED NOT DETECTED Final   Shiga like toxin producing E coli (STEC) DETECTED (A) NOT DETECTED Final    Comment: RESULT CALLED TO, READ BACK BY AND VERIFIED WITH: IRECI OMOSEEBI ON 01/20/17 AT 1353 [place]    E. coli O157 NOT DETECTED NOT DETECTED Final   Shigella/Enteroinvasive E coli (EIEC) NOT DETECTED NOT DETECTED Final   Cryptosporidium NOT  DETECTED NOT DETECTED Final   Cyclospora cayetanensis NOT DETECTED NOT DETECTED Final   Entamoeba histolytica NOT DETECTED NOT DETECTED Final   Giardia lamblia NOT DETECTED NOT DETECTED Final   Adenovirus F40/41 NOT DETECTED NOT DETECTED Final   Astrovirus NOT DETECTED NOT DETECTED Final   Norovirus GI/GII NOT DETECTED NOT DETECTED Final   Rotavirus A NOT DETECTED NOT DETECTED Final   Sapovirus (I, II, IV, and V) NOT DETECTED NOT DETECTED Final     Labs: CBC:  Recent Labs Lab 01/21/17 0542 01/22/17 0858 01/23/17 0616  WBC 13.1* 11.2* 10.0  NEUTROABS 6.3 4.5 3.8  HGB 12.4* 13.2 12.4*  HCT 36.5* 39.0 36.8*  MCV 96.1  95.1 95.3  PLT 276 350 478   Basic Metabolic Panel:  Recent Labs Lab 01/21/17 0542 01/22/17 0858 01/23/17 0616  NA 138 140 139  K 3.4* 3.9 4.1  CL 108 109 110  CO2 21* 24 25  GLUCOSE 68 95 79  BUN 11 7 5*  CREATININE 0.91 0.90 0.78  CALCIUM 7.6* 8.1* 7.9*  MG 2.0 1.9 1.7   Liver Function Tests:  Recent Labs Lab 01/21/17 0542 01/22/17 0858 01/23/17 0616  AST 23 48* 58*  ALT 31 47 56  ALKPHOS 135* 184* 174*  BILITOT 0.9 0.7 0.8  PROT 4.8* 5.6* 4.9*  ALBUMIN 2.2* 2.4* 2.3*    Recent Labs Lab 01/21/17 1055  LIPASE 133*   Time spent: 35 minutes  Signed:  PATEL, PRANAV  [facility] 01/23/2017 , 1:10 PM

## 2018-05-19 ENCOUNTER — Other Ambulatory Visit (HOSPITAL_COMMUNITY)
Admission: RE | Admit: 2018-05-19 | Disposition: A | Discharge status: Home / Self Care | Payer: Medicare Other | Source: Ambulatory Visit | Attending: Oncology | Admitting: Oncology

## 2018-05-19 DIAGNOSIS — C9110 Chronic lymphocytic leukemia of B-cell type not having achieved remission: Secondary | ICD-10-CM

## 2018-06-23 DIAGNOSIS — F419 Anxiety disorder, unspecified: Secondary | ICD-10-CM

## 2018-06-23 DIAGNOSIS — I10 Essential (primary) hypertension: Secondary | ICD-10-CM | POA: Diagnosis not present

## 2018-06-23 DIAGNOSIS — C9110 Chronic lymphocytic leukemia of B-cell type not having achieved remission: Secondary | ICD-10-CM | POA: Diagnosis not present

## 2018-06-23 DIAGNOSIS — R5383 Other fatigue: Secondary | ICD-10-CM

## 2018-09-30 DIAGNOSIS — C9110 Chronic lymphocytic leukemia of B-cell type not having achieved remission: Secondary | ICD-10-CM

## 2018-10-29 DIAGNOSIS — C9110 Chronic lymphocytic leukemia of B-cell type not having achieved remission: Secondary | ICD-10-CM | POA: Diagnosis not present

## 2019-02-27 ENCOUNTER — Emergency Department (HOSPITAL_COMMUNITY): Payer: Medicare Other

## 2019-02-27 ENCOUNTER — Inpatient Hospital Stay (HOSPITAL_COMMUNITY)
Admission: EM | Admit: 2019-02-27 | Discharge: 2019-03-07 | DRG: 445 | Disposition: A | Discharge status: Home / Self Care | Payer: Medicare Other | Attending: Gastroenterology | Admitting: Gastroenterology

## 2019-02-27 ENCOUNTER — Encounter (HOSPITAL_COMMUNITY): Payer: Self-pay

## 2019-02-27 ENCOUNTER — Other Ambulatory Visit: Payer: Self-pay

## 2019-02-27 DIAGNOSIS — Z86010 Personal history of colonic polyps: Secondary | ICD-10-CM

## 2019-02-27 DIAGNOSIS — K8010 Calculus of gallbladder with chronic cholecystitis without obstruction: Secondary | ICD-10-CM | POA: Diagnosis present

## 2019-02-27 DIAGNOSIS — R945 Abnormal results of liver function studies: Secondary | ICD-10-CM

## 2019-02-27 DIAGNOSIS — R1011 Right upper quadrant pain: Secondary | ICD-10-CM

## 2019-02-27 DIAGNOSIS — K219 Gastro-esophageal reflux disease without esophagitis: Secondary | ICD-10-CM | POA: Diagnosis present

## 2019-02-27 DIAGNOSIS — K8062 Calculus of gallbladder and bile duct with acute cholecystitis without obstruction: Principal | ICD-10-CM | POA: Diagnosis present

## 2019-02-27 DIAGNOSIS — I10 Essential (primary) hypertension: Secondary | ICD-10-CM | POA: Diagnosis present

## 2019-02-27 DIAGNOSIS — K8000 Calculus of gallbladder with acute cholecystitis without obstruction: Secondary | ICD-10-CM

## 2019-02-27 DIAGNOSIS — D1803 Hemangioma of intra-abdominal structures: Secondary | ICD-10-CM | POA: Diagnosis present

## 2019-02-27 DIAGNOSIS — R7989 Other specified abnormal findings of blood chemistry: Secondary | ICD-10-CM

## 2019-02-27 DIAGNOSIS — K819 Cholecystitis, unspecified: Secondary | ICD-10-CM | POA: Diagnosis not present

## 2019-02-27 DIAGNOSIS — K8042 Calculus of bile duct with acute cholecystitis without obstruction: Secondary | ICD-10-CM

## 2019-02-27 DIAGNOSIS — N179 Acute kidney failure, unspecified: Secondary | ICD-10-CM | POA: Diagnosis present

## 2019-02-27 DIAGNOSIS — C9110 Chronic lymphocytic leukemia of B-cell type not having achieved remission: Secondary | ICD-10-CM | POA: Diagnosis present

## 2019-02-27 DIAGNOSIS — Z9049 Acquired absence of other specified parts of digestive tract: Secondary | ICD-10-CM

## 2019-02-27 DIAGNOSIS — Z20828 Contact with and (suspected) exposure to other viral communicable diseases: Secondary | ICD-10-CM | POA: Diagnosis present

## 2019-02-27 DIAGNOSIS — K8001 Calculus of gallbladder with acute cholecystitis with obstruction: Secondary | ICD-10-CM

## 2019-02-27 DIAGNOSIS — K8050 Calculus of bile duct without cholangitis or cholecystitis without obstruction: Secondary | ICD-10-CM

## 2019-02-27 DIAGNOSIS — Z79899 Other long term (current) drug therapy: Secondary | ICD-10-CM

## 2019-02-27 HISTORY — DX: Chronic lymphocytic leukemia of B-cell type not having achieved remission: C91.10

## 2019-02-27 LAB — COMPREHENSIVE METABOLIC PANEL
ALT: 105 U/L — ABNORMAL HIGH (ref 0–44)
AST: 73 U/L — ABNORMAL HIGH (ref 15–41)
Albumin: 3.4 g/dL — ABNORMAL LOW (ref 3.5–5.0)
Alkaline Phosphatase: 277 U/L — ABNORMAL HIGH (ref 38–126)
Anion gap: 9 (ref 5–15)
BUN: 14 mg/dL (ref 8–23)
CO2: 28 mmol/L (ref 22–32)
Calcium: 9.4 mg/dL (ref 8.9–10.3)
Chloride: 100 mmol/L (ref 98–111)
Creatinine, Ser: 1.42 mg/dL — ABNORMAL HIGH (ref 0.61–1.24)
GFR calc Af Amer: 58 mL/min — ABNORMAL LOW (ref >60)
GFR calc non Af Amer: 50 mL/min — ABNORMAL LOW (ref >60)
Glucose, Bld: 108 mg/dL — ABNORMAL HIGH (ref 70–99)
Potassium: 4.1 mmol/L (ref 3.5–5.1)
Sodium: 137 mmol/L (ref 135–145)
Total Bilirubin: 3.4 mg/dL — ABNORMAL HIGH (ref 0.3–1.2)
Total Protein: 7 g/dL (ref 6.5–8.1)

## 2019-02-27 LAB — CBC
HCT: 43.7 % (ref 39.0–52.0)
Hemoglobin: 14.7 g/dL (ref 13.0–17.0)
MCH: 32.7 pg (ref 26.0–34.0)
MCHC: 33.6 g/dL (ref 30.0–36.0)
MCV: 97.1 fL (ref 80.0–100.0)
Platelets: 213 10*3/uL (ref 150–400)
RBC: 4.5 MIL/uL (ref 4.22–5.81)
RDW: 12.1 % (ref 11.5–15.5)
WBC: 23.9 10*3/uL — ABNORMAL HIGH (ref 4.0–10.5)
nRBC: 0 % (ref 0.0–0.2)

## 2019-02-27 LAB — LIPASE, BLOOD: Lipase: 30 U/L (ref 11–51)

## 2019-02-27 NOTE — ED Triage Notes (Signed)
Pt arrives POV for eval of RUQ abd pain onset 2 days ago. Rports he believes he got food poisoning approx 4 days ago, states that he now has severe RUQ pain stating "I think I might have torn my diaphragm". Tonight, fever of 103.8, took advil at home w/ some relief of pain. Pt currently dx'd w/ CLL

## 2019-02-27 NOTE — ED Notes (Addendum)
Jonathon Parker (Wife): [phone] Call with questions and updates when possible.

## 2019-02-28 ENCOUNTER — Emergency Department (HOSPITAL_COMMUNITY): Payer: Medicare Other

## 2019-02-28 ENCOUNTER — Encounter (HOSPITAL_COMMUNITY): Payer: Self-pay | Admitting: Certified Registered"

## 2019-02-28 ENCOUNTER — Observation Stay (HOSPITAL_COMMUNITY): Payer: Medicare Other

## 2019-02-28 ENCOUNTER — Encounter (HOSPITAL_COMMUNITY): Admission: EM | Disposition: A | Discharge status: Home / Self Care | Payer: Self-pay | Source: Home / Self Care

## 2019-02-28 DIAGNOSIS — K8010 Calculus of gallbladder with chronic cholecystitis without obstruction: Secondary | ICD-10-CM | POA: Diagnosis present

## 2019-02-28 LAB — COMPREHENSIVE METABOLIC PANEL
ALT: 79 U/L — ABNORMAL HIGH (ref 0–44)
AST: 43 U/L — ABNORMAL HIGH (ref 15–41)
Albumin: 2.8 g/dL — ABNORMAL LOW (ref 3.5–5.0)
Alkaline Phosphatase: 214 U/L — ABNORMAL HIGH (ref 38–126)
Anion gap: 10 (ref 5–15)
BUN: 14 mg/dL (ref 8–23)
CO2: 24 mmol/L (ref 22–32)
Calcium: 8.5 mg/dL — ABNORMAL LOW (ref 8.9–10.3)
Chloride: 102 mmol/L (ref 98–111)
Creatinine, Ser: 1.16 mg/dL (ref 0.61–1.24)
GFR calc Af Amer: >60 mL/min (ref >60)
GFR calc non Af Amer: >60 mL/min (ref >60)
Glucose, Bld: 97 mg/dL (ref 70–99)
Potassium: 3.8 mmol/L (ref 3.5–5.1)
Sodium: 136 mmol/L (ref 135–145)
Total Bilirubin: 4 mg/dL — ABNORMAL HIGH (ref 0.3–1.2)
Total Protein: 6.3 g/dL — ABNORMAL LOW (ref 6.5–8.1)

## 2019-02-28 LAB — SURGICAL PCR SCREEN
MRSA, PCR: NEGATIVE
Staphylococcus aureus: POSITIVE — AB

## 2019-02-28 LAB — URINALYSIS, ROUTINE W REFLEX MICROSCOPIC
Bacteria, UA: NONE SEEN
Glucose, UA: NEGATIVE mg/dL
Ketones, ur: 5 mg/dL — AB
Leukocytes,Ua: NEGATIVE
Nitrite: NEGATIVE
Protein, ur: 100 mg/dL — AB
Specific Gravity, Urine: 1.026 (ref 1.005–1.030)
pH: 5 (ref 5.0–8.0)

## 2019-02-28 LAB — SARS CORONAVIRUS 2 BY RT PCR (HOSPITAL ORDER, PERFORMED IN ~~LOC~~ HOSPITAL LAB): SARS Coronavirus 2: NEGATIVE

## 2019-02-28 SURGERY — LAPAROSCOPIC CHOLECYSTECTOMY WITH INTRAOPERATIVE CHOLANGIOGRAM
Anesthesia: General

## 2019-02-28 MED ORDER — PROMETHAZINE HCL 25 MG/ML IJ SOLN: 12.5000 mg | Freq: Four times a day (QID) | INTRAMUSCULAR | Status: DC | PRN

## 2019-02-28 MED ORDER — DIPHENHYDRAMINE HCL 50 MG/ML IJ SOLN: 12.5000 mg | Freq: Four times a day (QID) | INTRAMUSCULAR | Status: DC | PRN

## 2019-02-28 MED ORDER — KCL IN DEXTROSE-NACL 20-5-0.45 MEQ/L-%-% IV SOLN
INTRAVENOUS | Status: DC
  Administered 2019-02-28 – 2019-03-01 (×4): via INTRAVENOUS
  Filled 2019-02-28 (×4): qty 1000

## 2019-02-28 MED ORDER — GADOBUTROL 1 MMOL/ML IV SOLN
8.0000 mL | Freq: Once | INTRAVENOUS | Status: AC | PRN
  Administered 2019-02-28: 15:00:00 8 mL via INTRAVENOUS

## 2019-02-28 MED ORDER — ALPRAZOLAM 0.25 MG PO TABS
0.2500 mg | ORAL_TABLET | Freq: Every evening | ORAL | Status: DC | PRN
  Administered 2019-03-01 – 2019-03-06 (×5): 0.25 mg via ORAL
  Filled 2019-02-28 (×5): qty 1

## 2019-02-28 MED ORDER — MORPHINE SULFATE (PF) 2 MG/ML IV SOLN
1.0000 mg | INTRAVENOUS | Status: DC | PRN
  Administered 2019-02-28 – 2019-03-04 (×6): 2 mg via INTRAVENOUS
  Filled 2019-02-28 (×6): qty 1

## 2019-02-28 MED ORDER — ENOXAPARIN SODIUM 40 MG/0.4ML ~~LOC~~ SOLN
40.0000 mg | SUBCUTANEOUS | Status: DC
  Administered 2019-03-05: 22:00:00 40 mg via SUBCUTANEOUS
  Filled 2019-02-28 (×4): qty 0.4

## 2019-02-28 MED ORDER — MORPHINE SULFATE (PF) 4 MG/ML IV SOLN: 4.0000 mg | INTRAVENOUS | Status: DC | PRN

## 2019-02-28 MED ORDER — BUPROPION HCL ER (XL) 150 MG PO TB24
300.0000 mg | ORAL_TABLET | Freq: Every day | ORAL | Status: DC
  Administered 2019-02-28 – 2019-03-06 (×7): 300 mg via ORAL
  Filled 2019-02-28 (×7): qty 2

## 2019-02-28 MED ORDER — PANTOPRAZOLE SODIUM 40 MG IV SOLR
40.0000 mg | Freq: Every day | INTRAVENOUS | Status: DC
  Administered 2019-02-28 – 2019-03-02 (×2): 40 mg via INTRAVENOUS
  Filled 2019-02-28 (×3): qty 40

## 2019-02-28 MED ORDER — ACETAMINOPHEN 650 MG RE SUPP: 650.0000 mg | Freq: Four times a day (QID) | RECTAL | Status: DC | PRN

## 2019-02-28 MED ORDER — SODIUM CHLORIDE 0.9 % IV BOLUS
2000.0000 mL | Freq: Once | INTRAVENOUS | Status: AC
  Administered 2019-02-28: 04:00:00 1000 mL via INTRAVENOUS

## 2019-02-28 MED ORDER — DIPHENHYDRAMINE HCL 12.5 MG/5ML PO ELIX: 12.5000 mg | ORAL_SOLUTION | Freq: Four times a day (QID) | ORAL | Status: DC | PRN

## 2019-02-28 MED ORDER — PIPERACILLIN-TAZOBACTAM 3.375 G IVPB 30 MIN
3.3750 g | Freq: Once | INTRAVENOUS | Status: AC
  Administered 2019-02-28: 04:00:00 3.375 g via INTRAVENOUS
  Filled 2019-02-28: qty 50

## 2019-02-28 MED ORDER — OXYCODONE HCL 5 MG PO TABS
5.0000 mg | ORAL_TABLET | ORAL | Status: DC | PRN
  Administered 2019-02-28 – 2019-03-04 (×10): 10 mg via ORAL
  Administered 2019-03-05: 21:00:00 5 mg via ORAL
  Administered 2019-03-06 – 2019-03-07 (×6): 10 mg via ORAL
  Filled 2019-02-28 (×16): qty 2
  Filled 2019-02-28: qty 1

## 2019-02-28 MED ORDER — TRAZODONE HCL 100 MG PO TABS
100.0000 mg | ORAL_TABLET | Freq: Every evening | ORAL | Status: DC | PRN
  Administered 2019-02-28 – 2019-03-06 (×7): 100 mg via ORAL
  Filled 2019-02-28 (×7): qty 1

## 2019-02-28 MED ORDER — PIPERACILLIN-TAZOBACTAM 3.375 G IVPB
3.3750 g | Freq: Three times a day (TID) | INTRAVENOUS | Status: DC
  Administered 2019-02-28 – 2019-03-06 (×19): 3.375 g via INTRAVENOUS
  Filled 2019-02-28 (×17): qty 50

## 2019-02-28 MED ORDER — ONDANSETRON HCL 4 MG/2ML IJ SOLN
4.0000 mg | Freq: Four times a day (QID) | INTRAMUSCULAR | Status: DC | PRN
  Administered 2019-03-01: 4 mg via INTRAVENOUS

## 2019-02-28 MED ORDER — ACETAMINOPHEN 325 MG PO TABS
650.0000 mg | ORAL_TABLET | Freq: Four times a day (QID) | ORAL | Status: DC | PRN
  Administered 2019-02-28 – 2019-03-03 (×2): 650 mg via ORAL
  Filled 2019-02-28 (×2): qty 2

## 2019-02-28 NOTE — ED Notes (Signed)
Called Dr. Anselmo Rod to confirm D5 0.45 NaCl with KCL d/t K WDL. Dr. Sabra Heck confirmed order. RN to hang medication

## 2019-02-28 NOTE — Progress Notes (Addendum)
Subjective/Chief Complaint: Reports headache   Objective: Vital signs in last 24 hours: Temp:  [98.5 F (36.9 C)-99.1 F (37.3 C)] 99.1 F (37.3 C) (07/26 0758) Pulse Rate:  [61-82] 70 (07/26 0758) Resp:  [15-25] 22 (07/26 0758) BP: (137-158)/(78-92) 155/84 (07/26 0758) SpO2:  [93 %-100 %] 100 % (07/26 0758) Weight:  [83.9 kg-87.7 kg] 87.7 kg (07/26 0758) Last BM Date: 02/20/19  Intake/Output from previous day: No intake/output data recorded. Intake/Output this shift: Total I/O In: -  Out: 400 [Urine:400]  General appearance: alert and cooperative Resp: clear to auscultation bilaterally Cardio: regular rate and rhythm GI: soft, moderately TTP RUQ without palpable mass, no peritonitis Skin: Skin color, texture, turgor normal. No rashes or lesions Neurologic: Grossly normal  Lab Results:  Recent Labs    02/27/19 2248  WBC 23.9*  HGB 14.7  HCT 43.7  PLT 213   BMET Recent Labs    02/27/19 2248 02/28/19 0554  NA 137 136  K 4.1 3.8  CL 100 102  CO2 28 24  GLUCOSE 108* 97  BUN 14 14  CREATININE 1.42* 1.16  CALCIUM 9.4 8.5*   PT/INR No results for input(s): LABPROT, INR in the last 72 hours. ABG No results for input(s): PHART, HCO3 in the last 72 hours.  Invalid input(s): PCO2, PO2  Studies/Results: Dg Chest 2 View  Result Date: 02/27/2019 CLINICAL DATA:  Chest pain EXAM: CHEST - 2 VIEW COMPARISON:  12/16/2006 FINDINGS: Normal heart size, mediastinal contours, and pulmonary vascularity. Lungs clear. No infiltrate, pleural effusion or pneumothorax. Bones unremarkable. IMPRESSION: No acute abnormalities. Electronically Signed   By: Lavonia Dana M.D.   On: 02/27/2019 22:21   US Abdomen Limited Ruq  Result Date: 02/28/2019 CLINICAL DATA:  68 year old male with history of right upper quadrant abdominal pain today. Elevated bilirubin. History of gallstones. EXAM: ULTRASOUND ABDOMEN LIMITED RIGHT UPPER QUADRANT COMPARISON:  Abdominal ultrasound 01/19/2017.  FINDINGS: Gallbladder: Multiple echogenic foci are present within the gallbladder, some of which demonstrate distal posterior acoustic shadowing, compatible with gallstones, largest of which measures up to 8 mm. In addition, there is more amorphous echogenic material which does not shadow, compatible with biliary sludge and biliary sludge balls. Gallbladder moderately distended. Gallbladder wall is thickened measuring 6 mm. No pericholecystic fluid. Per report from the sonographer there was a sonographic Murphy's sign on examination. Common bile duct: Diameter: 4 mm proximally.  Distal duct could not be visualized. Liver: No focal lesion identified. Within normal limits in parenchymal echogenicity. Portal vein is patent on color Doppler imaging with normal direction of blood flow towards the liver. IMPRESSION: 1. Gallstones, biliary sludge and tumefactive sludge balls in the gallbladder. Gallbladder is only moderately distended, however, there is wall thickening and a positive Murphy's sign on examination. Findings are concerning for early acute cholecystitis. Electronically Signed   By: Vinnie Langton M.D.   On: 02/28/2019 04:28    Anti-infectives: Anti-infectives (From admission, onward)   Start     Dose/Rate Route Frequency Ordered Stop   02/28/19 0730  piperacillin-tazobactam (ZOSYN) IVPB 3.375 g     3.375 g 12.5 mL/hr over 240 Minutes Intravenous Every 8 hours 02/28/19 0727     02/28/19 0330  piperacillin-tazobactam (ZOSYN) IVPB 3.375 g     3.375 g 100 mL/hr over 30 Minutes Intravenous  Once 02/28/19 0327 02/28/19 0438      Assessment/Plan:  Acute calculus cholecystitis- symptoms for 1 week, reports the worst stabbing pain is gone and has transitioned to a mild ache.  WBC 24 (wife thinks his baseline may be around 15 with the CLL). Continue Zosyn, plan laparoscopic cholecystectomy this admission. This will likely be a very difficult case and risks of needing subtotal cholecystectomy or  fenestrated chole/ drain placement are increased. Increased risk of complications. Will continue fluid resuscitation and abx today, possible OR tomorrow.   Elevated LFTs- tbili remains elevated at 4 this am, enzymes only mildly elevated. This is most likely secondary to cholecystitis, but given persistent elevated tbili/alk phos more than enzymes, will proceed with MRCP prior to surgery  Acute kidney injury- improving with fluid  Hypertension  CLL  I updated his wife, Jonathon Parker, by phone.    Addendum 16:20- MRCP shows choledocholithiasis. [facility] (Dr. Hilarie Fredrickson) paged. Possible ERCP tomorrow   LOS: 0 days    Jonathon Parker 02/28/2019

## 2019-02-28 NOTE — H&P (Signed)
Jonathon Parker is an 68 y.o. male.   Chief Complaint: n/v HPI: 68 year old male with history of hypertension, CLL stage 0 presents to the emergency room for ongoing nausea and poor appetite.  He also has had right upper quadrant pain.  He states his symptoms developed last Sunday after eating a tomato sandwich that tasted funny.  He immediately had multiple episodes of nausea and vomiting.  He also had discomfort from all the vomiting.  He states that he would have waves of pain in the right upper abdomen.  He describes it as a stabbing sensation that would last 10 minutes or so but would repeat quite frequently.  He had some back pain but he attributed that to all the vomiting.  He continued to have episodes of nausea throughout the week.  He only vomited again on Monday.  He developed some fever but he cannot remember the day that he broke out in a fever.  He had poor appetite and is only had 3 peanut butter crackers in the past 3 days.  His wife has been encouraged him to drink liquids.  He finally came to the emergency room when he lost his desire to drink liquids.  No diarrhea.  Last bowel movement was day before yesterday.  He was diagnosed with CLL back in fall 2019.  Stage 0.  No treatment just observation now  His surgical history includes what sounds like a sigmoid colectomy or LAR for a precancerous mass in 2002.  He has a remote history of appendectomy.  He then underwent laparoscopic bilateral inguinal hernia repairs in 2016.  He denies any chest pain, chest pressure, shortness of breath, TIAs or amaurosis fugax.  He does endorse some dyspnea on exertion over the past several months which he attributes to his CLL diagnosis.  He may have 1 alcoholic beverage per day.  No tobacco or drugs.  Past Medical History:  Diagnosis Date  . Depression     Past Surgical History:  Procedure Laterality Date  . COLON SURGERY    . HERNIA REPAIR      History reviewed. No pertinent family history.  Social History:  reports that he has never smoked. He has never used smokeless tobacco. He reports current alcohol use. He reports that he does not use drugs.  Allergies: No Known Allergies  (Not in a hospital admission)   Results for orders placed or performed during the hospital encounter of 02/27/19 (from the past 48 hour(s))  Lipase, blood     Status: None   Collection Time: 02/27/19 10:48 PM  Result Value Ref Range   Lipase 30 11 - 51 U/L    Comment: Performed at [facility], [address] [place], [place] [address]  Comprehensive metabolic panel     Status: Abnormal   Collection Time: 02/27/19 10:48 PM  Result Value Ref Range   Sodium 137 135 - 145 mmol/L   Potassium 4.1 3.5 - 5.1 mmol/L   Chloride 100 98 - 111 mmol/L   CO2 28 22 - 32 mmol/L   Glucose, Bld 108 (H) 70 - 99 mg/dL   BUN 14 8 - 23 mg/dL   Creatinine, Ser 1.42 (H) 0.61 - 1.24 mg/dL   Calcium 9.4 8.9 - 10.3 mg/dL   Total Protein 7.0 6.5 - 8.1 g/dL   Albumin 3.4 (L) 3.5 - 5.0 g/dL   AST 73 (H) 15 - 41 U/L   ALT 105 (H) 0 - 44 U/L   Alkaline Phosphatase  277 (H) 38 - 126 U/L   Total Bilirubin 3.4 (H) 0.3 - 1.2 mg/dL   GFR calc non Af Amer 50 (L) >60 mL/min   GFR calc Af Amer 58 (L) >60 mL/min   Anion gap 9 5 - 15    Comment: Performed at [facility] [address], [place], [place] [address]  CBC     Status: Abnormal   Collection Time: 02/27/19 10:48 PM  Result Value Ref Range   WBC 23.9 (H) 4.0 - 10.5 K/uL   RBC 4.50 4.22 - 5.81 MIL/uL   Hemoglobin 14.7 13.0 - 17.0 g/dL   HCT 43.7 39.0 - 52.0 %   MCV 97.1 80.0 - 100.0 fL   MCH 32.7 26.0 - 34.0 pg   MCHC 33.6 30.0 - 36.0 g/dL   RDW 12.1 11.5 - 15.5 %   Platelets 213 150 - 400 K/uL   nRBC 0.0 0.0 - 0.2 %    Comment: Performed at [facility], [place] [address], [place], [place] [address]  Urinalysis, Routine w reflex microscopic     Status: Abnormal   Collection Time: 02/28/19  3:20 AM  Result Value Ref Range   Color, Urine AMBER  (A) YELLOW    Comment: BIOCHEMICALS MAY BE AFFECTED BY COLOR   APPearance HAZY (A) CLEAR   Specific Gravity, Urine 1.026 1.005 - 1.030   pH 5.0 5.0 - 8.0   Glucose, UA NEGATIVE NEGATIVE mg/dL   Hgb urine dipstick SMALL (A) NEGATIVE   Bilirubin Urine SMALL (A) NEGATIVE   Ketones, ur 5 (A) NEGATIVE mg/dL   Protein, ur 100 (A) NEGATIVE mg/dL   Nitrite NEGATIVE NEGATIVE   Leukocytes,Ua NEGATIVE NEGATIVE   RBC / HPF 0-5 0 - 5 RBC/hpf   WBC, UA 0-5 0 - 5 WBC/hpf   Bacteria, UA NONE SEEN NONE SEEN   Squamous Epithelial / LPF 0-5 0 - 5   Mucus PRESENT    Hyaline Casts, UA PRESENT     Comment: Performed at [facility], [place] [address] [place], [place] [address]   Dg Chest 2 View  Result Date: 02/27/2019 CLINICAL DATA:  Chest pain EXAM: CHEST - 2 VIEW COMPARISON:  12/16/2006 FINDINGS: Normal heart size, mediastinal contours, and pulmonary vascularity. Lungs clear. No infiltrate, pleural effusion or pneumothorax. Bones unremarkable. IMPRESSION: No acute abnormalities. Electronically Signed   By: Lavonia Dana M.D.   On: 02/27/2019 22:21   US Abdomen Limited Ruq  Result Date: 02/28/2019 CLINICAL DATA:  68 year old male with history of right upper quadrant abdominal pain today. Elevated bilirubin. History of gallstones. EXAM: ULTRASOUND ABDOMEN LIMITED RIGHT UPPER QUADRANT COMPARISON:  Abdominal ultrasound 01/19/2017. FINDINGS: Gallbladder: Multiple echogenic foci are present within the gallbladder, some of which demonstrate distal posterior acoustic shadowing, compatible with gallstones, largest of which measures up to 8 mm. In addition, there is more amorphous echogenic material which does not shadow, compatible with biliary sludge and biliary sludge balls. Gallbladder moderately distended. Gallbladder wall is thickened measuring 6 mm. No pericholecystic fluid. Per report from the sonographer there was a sonographic Murphy's sign on examination. Common bile duct: Diameter: 4 mm proximally.   Distal duct could not be visualized. Liver: No focal lesion identified. Within normal limits in parenchymal echogenicity. Portal vein is patent on color Doppler imaging with normal direction of blood flow towards the liver. IMPRESSION: 1. Gallstones, biliary sludge and tumefactive sludge balls in the gallbladder. Gallbladder is only moderately distended, however, there is wall thickening  and a positive Murphy's sign on examination. Findings are concerning for early acute cholecystitis. Electronically Signed   By: Vinnie Langton M.D.   On: 02/28/2019 04:28    Review of Systems  Constitutional: Positive for fever and malaise/fatigue. Negative for weight loss.  HENT: Negative for nosebleeds.   Eyes: Negative for blurred vision.  Respiratory: Negative for shortness of breath.   Cardiovascular: Negative for chest pain, palpitations, orthopnea and PND.       Denies DOE  Gastrointestinal: Positive for abdominal pain, nausea and vomiting.  Genitourinary: Negative for dysuria and hematuria.  Musculoskeletal: Negative.   Skin: Negative for itching and rash.  Neurological: Negative for dizziness, focal weakness, seizures, loss of consciousness and headaches.       Denies TIAs, amaurosis fugax  Endo/Heme/Allergies: Does not bruise/bleed easily.  Psychiatric/Behavioral: The patient is not nervous/anxious.     Blood pressure 139/83, pulse 70, temperature 98.5 F (36.9 C), temperature source Oral, resp. rate 15, height 6' (1.829 m), weight 83.9 kg, SpO2 100 %. Physical Exam  Vitals reviewed. Constitutional: He is oriented to person, place, and time. Vital signs are normal. He appears well-developed and well-nourished. He appears ill. No distress.  HENT:  Head: Normocephalic and atraumatic.  Right Ear: External ear normal.  Left Ear: External ear normal.  Eyes: Conjunctivae are normal. No scleral icterus.  Neck: Normal range of motion. Neck supple. No tracheal deviation present. No thyromegaly  present.  Cardiovascular: Normal rate and normal heart sounds.  Respiratory: Effort normal and breath sounds normal. No stridor. No respiratory distress. He has no wheezes.  GI: Soft. Normal appearance. There is abdominal tenderness in the right upper quadrant. There is no rigidity, no rebound and no guarding.    Soft, nd, lower midline incision/scar; some TTP in lateral RUQ. No rebound/guarding.   Musculoskeletal:        General: No tenderness or edema.  Lymphadenopathy:    He has no cervical adenopathy.  Neurological: He is alert and oriented to person, place, and time. He exhibits normal muscle tone.  Skin: Skin is warm and dry. No rash noted. He is not diaphoretic. No erythema. There is pallor.  No jaundice  Psychiatric: He has a normal mood and affect. His behavior is normal. Judgment and thought content normal.     Assessment/Plan Acute calculus cholecystitis Elevated LFTs Acute kidney injury Hypertension CLL  Believe the LFTs are probably secondary to his cholecystitis.  The bile duct was not dilated on ultrasound.  Trend comprehensive metabolic panel  IV fluid resuscitation for his acute kidney injury.  Follow electrolytes and renal function  IV antibiotic  Assuming his COVID-19 test is negative &operating room availability,  the plan would be laparoscopic cholecystectomy with possible cholangiogram  I believe the patient's symptoms are consistent with gallbladder disease.  We discussed gallbladder disease.   I discussed laparoscopic cholecystectomy with IOC in detail.  .  We discussed the risks and benefits of a laparoscopic cholecystectomy including, but not limited to bleeding, infection, injury to surrounding structures such as the intestine or liver, bile leak, retained gallstones, need to convert to an open procedure, prolonged diarrhea, blood clots such as  DVT, common bile duct injury, anesthesia risks, and possible need for additional procedures.  We discussed the  typical post-operative recovery course. I explained that the likelihood of improvement of their symptoms is good.  However I did discuss with him that his risk for complications is increased due to the chronicity of his symptoms and his  prior abdominal surgery.  We specifically discussed the possibility of conversion to open, potential subtotal cholecystectomy, possible drain placement, increased risk of injury to surrounding structures, bile leak.  Leighton Ruff. Redmond Pulling, MD, FACS General, Bariatric, & Minimally Invasive Surgery [facility], [place]   Greer Pickerel, MD 02/28/2019, 5:47 AM

## 2019-02-28 NOTE — Anesthesia Preprocedure Evaluation (Deleted)
Anesthesia Evaluation    Reviewed: Allergy & Precautions, Patient's Chart, lab work & pertinent test results  Airway        Dental   Pulmonary neg pulmonary ROS,           Cardiovascular hypertension, Pt. on medications      Neuro/Psych PSYCHIATRIC DISORDERS Depression negative neurological ROS     GI/Hepatic Neg liver ROS, GERD  Medicated,  Endo/Other  negative endocrine ROS  Renal/GU negative Renal ROS     Musculoskeletal negative musculoskeletal ROS (+)   Abdominal   Peds  Hematology negative hematology ROS (+)   Anesthesia Other Findings CHOLECYSTITIS  Reproductive/Obstetrics                             Anesthesia Physical Anesthesia Plan  ASA: III  Anesthesia Plan: General   Post-op Pain Management:    Induction: Intravenous  PONV Risk Score and Plan: 3 and Ondansetron, Dexamethasone, Midazolam and Treatment may vary due to age or medical condition  Airway Management Planned: Oral ETT  Additional Equipment:   Intra-op Plan:   Post-operative Plan: Extubation in OR  Informed Consent:   Plan Discussed with:   Anesthesia Plan Comments:         Anesthesia Quick Evaluation

## 2019-02-28 NOTE — ED Notes (Signed)
Patient transported to Ultrasound 

## 2019-02-28 NOTE — Plan of Care (Signed)
  Problem: Health Behavior/Discharge Planning: Goal: Ability to manage health-related needs will improve Outcome: Progressing   

## 2019-02-28 NOTE — ED Provider Notes (Signed)
[facility] EMERGENCY DEPARTMENT Provider Note   CSN: [id number] Arrival date & time: 02/27/19  2147    History   Chief Complaint Chief Complaint  Patient presents with  . Emesis  . Abdominal Pain  . Fever    HPI Jonathon Parker is a 68 y.o. male with a h/o of CLL, shiga toxin-producing E. Coli, and Campylobacter diarrhea who presents to the emergency department with a chief complaint of fever.  The patient endorses a fever and chills for the last 4 days.  Fever tonight prior to arrival at home was 103.8.  He took Advil at [employer].   He reports that he had vomiting for 2 days that subsided 3 days ago and is concerned that he had food poisoning.  He reports that he has had significantly decreased PO intake since vomiting, and his urine has been very concentrated and brown for the last 3 days.  He reports that he has been having some sharp right upper quadrant pain that is worse with deep inspiration and positional changes. Patient states "I think I tore my diaphragm from vomiting."   He reports associated constipation.  Last bowel movement was 5 days ago.  He has been passing flatus.  No diarrhea, shortness of breath, chest pain, back pain, dysuria, urinary frequency or hesitancy.   Surgical history includes appendectomy, multiple hernia repairs, and colon resection secondary to tennis ball sized precancerous mass in the colon.  He does not take any blood thinners.  Oncologist: Dr. Hinton Rao     The history is provided by the patient. No language interpreter was used.    Past Medical History:  Diagnosis Date  . Depression     Patient Active Problem List   Diagnosis Date Noted  . Calculous cholecystitis 02/28/2019  . Campylobacter diarrhea   . Diarrhea of infectious origin   . STEC (Shiga toxin-producing Escherichia coli)   . Elevated LFTs   . Hypokalemia   . Gastroenteritis 01/19/2017  . Sepsis ([place]) 01/19/2017  . GERD (gastroesophageal reflux  disease) 01/19/2017  . Depression 01/19/2017    Past Surgical History:  Procedure Laterality Date  . COLON SURGERY    . HERNIA REPAIR          Home Medications    Prior to Admission medications   Medication Sig Start Date End Date Taking? Authorizing Provider  ALPRAZolam Duanne Moron) 0.25 MG tablet Take 0.25 mg by mouth at bedtime as needed for anxiety.   Yes [provider]  buPROPion (WELLBUTRIN XL) 300 MG 24 hr tablet Take 300 mg by mouth at bedtime.   Yes [provider]  esomeprazole (NEXIUM) 40 MG capsule Take 40 mg by mouth daily.    Yes [provider]  ibuprofen (ADVIL,MOTRIN) 200 MG tablet Take 800 mg by mouth every 6 (six) hours as needed for fever or mild pain.   Yes [provider]  lisinopril (ZESTRIL) 20 MG tablet Take 20 mg by mouth daily.   Yes [provider]  traZODone (DESYREL) 100 MG tablet Take 100 mg by mouth at bedtime.   Yes [provider]  HYDROcodone-acetaminophen (NORCO/VICODIN) 5-325 MG tablet Take 2 tablets by mouth every 4 (four) hours as needed. Patient not taking: Reported on 02/28/2019 01/16/17   Tanna Furry, MD  ondansetron (ZOFRAN ODT) 4 MG disintegrating tablet Take 1 tablet (4 mg total) by mouth every 8 (eight) hours as needed for nausea. Patient not taking: Reported on 02/28/2019 01/16/17   Tanna Furry, MD  saccharomyces boulardii (FLORASTOR) 250 MG capsule Take 1 capsule (250 mg total) by mouth 2 (two) times daily. Patient not taking: Reported on 02/28/2019 01/23/17   Lavina Hamman, MD    Family History History reviewed. No pertinent family history.  Social History Social History   Tobacco Use  . Smoking status: Never Smoker  . Smokeless tobacco: Never Used  Substance Use Topics  . Alcohol use: Yes  . Drug use: No     Allergies   Patient has no known allergies.   Review of Systems Review of Systems  Constitutional: Positive for chills and fever. Negative for appetite change,  diaphoresis and fatigue.  Respiratory: Negative for cough, shortness of breath and wheezing.   Cardiovascular: Negative for chest pain.  Gastrointestinal: Positive for abdominal pain, constipation, nausea and vomiting. Negative for anal bleeding, blood in stool, diarrhea and rectal pain.  Genitourinary: Positive for decreased urine volume. Negative for dysuria, flank pain, frequency, hematuria, penile pain, penile swelling and urgency.  Musculoskeletal: Negative for back pain.  Skin: Negative for rash.  Allergic/Immunologic: Negative for immunocompromised state.  Neurological: Negative for dizziness, syncope, weakness, numbness and headaches.  Psychiatric/Behavioral: Negative for confusion.     Physical Exam Updated Vital Signs BP (!) 148/78   Pulse 70   Temp 98.5 F (36.9 C) (Oral)   Resp 16   Ht 6' (1.829 m)   Wt 83.9 kg   SpO2 96%   BMI 25.09 kg/m   Physical Exam Vitals signs and nursing note reviewed.  Constitutional:      Appearance: He is well-developed and normal weight. He is not toxic-appearing or diaphoretic.     Comments: Uncomfortable appearing  HENT:     Head: Normocephalic.     Mouth/Throat:     Mouth: Mucous membranes are dry.  Eyes:     Conjunctiva/sclera: Conjunctivae normal.  Neck:     Musculoskeletal: Neck supple.  Cardiovascular:     Rate and Rhythm: Normal rate and regular rhythm.     Pulses: Normal pulses.     Heart sounds: Normal heart sounds. No murmur. No friction rub. No gallop.   Pulmonary:     Effort: Pulmonary effort is normal. No respiratory distress.     Breath sounds: Normal breath sounds. No stridor. No wheezing, rhonchi or rales.  Chest:     Chest wall: No tenderness.  Abdominal:     General: There is distension.     Tenderness: There is abdominal tenderness.     Comments: Hyperactive bowel sounds in all 4 quadrants.  Abdomen is distended, but soft.  Tender to palpation in the right upper quadrant.  He has a positive Murphy sign.   He also has some tenderness over the right CVA.  No left CVA tenderness.  No rebound or guarding.  Musculoskeletal:     Right lower leg: No edema.     Left lower leg: No edema.  Skin:    General: Skin is warm and dry.     Capillary Refill: Capillary refill takes 2 to 3 seconds.  Neurological:     Mental Status: He is alert.  Psychiatric:        Behavior: Behavior normal.      ED Treatments / Results  Labs (all labs ordered are listed, but only abnormal results are displayed) Labs Reviewed  COMPREHENSIVE METABOLIC PANEL - Abnormal; Notable for the following components:      Result Value   Glucose, Bld 108 (*)    Creatinine, Ser 1.42 (*)  Albumin 3.4 (*)    AST 73 (*)    ALT 105 (*)    Alkaline Phosphatase 277 (*)    Total Bilirubin 3.4 (*)    GFR calc non Af Amer 50 (*)    GFR calc Af Amer 58 (*)    All other components within normal limits  CBC - Abnormal; Notable for the following components:   WBC 23.9 (*)    All other components within normal limits  URINALYSIS, ROUTINE W REFLEX MICROSCOPIC - Abnormal; Notable for the following components:   Color, Urine AMBER (*)    APPearance HAZY (*)    Hgb urine dipstick SMALL (*)    Bilirubin Urine SMALL (*)    Ketones, ur 5 (*)    Protein, ur 100 (*)    All other components within normal limits  COMPREHENSIVE METABOLIC PANEL - Abnormal; Notable for the following components:   Calcium 8.5 (*)    Total Protein 6.3 (*)    Albumin 2.8 (*)    AST 43 (*)    ALT 79 (*)    Alkaline Phosphatase 214 (*)    Total Bilirubin 4.0 (*)    All other components within normal limits  SARS CORONAVIRUS 2 (HOSPITAL ORDER, [place] [facility])  LIPASE, BLOOD    EKG None  Radiology Dg Chest 2 View  Result Date: 02/27/2019 CLINICAL DATA:  Chest pain EXAM: CHEST - 2 VIEW COMPARISON:  12/16/2006 FINDINGS: Normal heart size, mediastinal contours, and pulmonary vascularity. Lungs clear. No infiltrate, pleural effusion or  pneumothorax. Bones unremarkable. IMPRESSION: No acute abnormalities. Electronically Signed   By: Lavonia Dana M.D.   On: 02/27/2019 22:21   US Abdomen Limited Ruq  Result Date: 02/28/2019 CLINICAL DATA:  68 year old male with history of right upper quadrant abdominal pain today. Elevated bilirubin. History of gallstones. EXAM: ULTRASOUND ABDOMEN LIMITED RIGHT UPPER QUADRANT COMPARISON:  Abdominal ultrasound 01/19/2017. FINDINGS: Gallbladder: Multiple echogenic foci are present within the gallbladder, some of which demonstrate distal posterior acoustic shadowing, compatible with gallstones, largest of which measures up to 8 mm. In addition, there is more amorphous echogenic material which does not shadow, compatible with biliary sludge and biliary sludge balls. Gallbladder moderately distended. Gallbladder wall is thickened measuring 6 mm. No pericholecystic fluid. Per report from the sonographer there was a sonographic Murphy's sign on examination. Common bile duct: Diameter: 4 mm proximally.  Distal duct could not be visualized. Liver: No focal lesion identified. Within normal limits in parenchymal echogenicity. Portal vein is patent on color Doppler imaging with normal direction of blood flow towards the liver. IMPRESSION: 1. Gallstones, biliary sludge and tumefactive sludge balls in the gallbladder. Gallbladder is only moderately distended, however, there is wall thickening and a positive Murphy's sign on examination. Findings are concerning for early acute cholecystitis. Electronically Signed   By: Vinnie Langton M.D.   On: 02/28/2019 04:28    Procedures Procedures (including critical care time)  Medications Ordered in ED Medications  morphine 4 MG/ML injection 4 mg (has no administration in time range)  ondansetron (ZOFRAN) injection 4 mg (has no administration in time range)  dextrose 5 % and 0.45 % NaCl with KCl 20 mEq/L infusion ( Intravenous New Bag/Given 02/28/19 0640)  sodium chloride 0.9  % bolus 2,000 mL (1,000 mLs Intravenous New Bag/Given 02/28/19 0351)  piperacillin-tazobactam (ZOSYN) IVPB 3.375 g (0 g Intravenous Stopped 02/28/19 0438)     Initial Impression / Assessment and Plan / ED Course  I have reviewed  the triage vital signs and the nursing notes.  Pertinent labs & imaging results that were available during my care of the patient were reviewed by me and considered in my medical decision making (see chart for details).        68 year old male with a h/o of CLL, shiga toxin-producing E. Coli, and Campylobacter diarrhea presenting with fever and chills for the last 4 days.  He had vomiting for 2 days, which is since resolved.  He is presenting with continued fever and right upper quadrant pain.  Afebrile on arrival to the ER.  No tachycardia.  He is otherwise hemodynamically stable.  CBC is elevated at 23,000, but the patient does have a history of CLL.  Transaminases and total bilirubin are elevated. Lipase is normal.  On exam, the patient has a focal tenderness in the right upper quadrant with a positive Murphy sign.  He appears dehydrated.  Will order Zosyn as I suspect the patient has cholecystitis.  Will give 2 L of IV fluids as he appears dehydrated.  Right upper quadrant ultrasound with gallstones, biliary sludge, and tumefactive sludge balls in the gallbladder.  Gallbladder is moderately distended with wall thickening and a positive sonographic Murphy sign that is consistent with early acute cholecystitis.  The patient was seen and independently evaluated by Dr. Jeris Penta, attending physician.  Consult to general surgery and Dr. Redmond Pulling will admit this patient for likely laparoscopic cholecystectomy.  COVID-19 test is pending.  Patient's pain is been well controlled since arrival in the ER.  Provided an update to the patient's wife via phone.  The patient appears reasonably stabilized for admission considering the current resources, flow, and capabilities available in the  ED at this time, and I doubt any other [facility] requiring further screening and/or treatment in the ED prior to admission.   Final Clinical Impressions(s) / ED Diagnoses   Final diagnoses:  RUQ abdominal pain  Cholecystitis    ED Discharge Orders    None       Joanne Gavel, PA-C 02/28/19 0649    Fatima Blank, MD 02/28/19 1943

## 2019-02-28 NOTE — ED Notes (Signed)
ED TO INPATIENT HANDOFF REPORT  ED Nurse Name and Phone #:   [phone]  S Name/Age/Gender Jonathon Parker 68 y.o. male Room/Bed: 023C/023C  Code Status   Code Status: Prior  Home/SNF/Other Home  Patient oriented to: self, place, time and situation Is this baseline? Yes   Triage Complete: Triage complete  Chief Complaint FEVER, POSS FOOD POISONING  Triage Note Pt arrives POV for eval of RUQ abd pain onset 2 days ago. Rports he believes he got food poisoning approx 4 days ago, states that he now has severe RUQ pain stating "I think I might have torn my diaphragm". Tonight, fever of 103.8, took advil at home w/ some relief of pain. Pt currently dx'd w/ CLL   Allergies No Known Allergies  Level of Care/Admitting Diagnosis ED Disposition    ED Disposition Condition Comment   Admit  Hospital Area: [facility] [[id number]]  Level of Care: Med-Surg [16]  Covid Evaluation: N/A  Diagnosis: Calculous cholecystitis [371062]  Admitting Physician: CCS, Fulda  Attending Physician: CCS, MD [[id number]  PT Class (Do Not Modify): Observation [104]  PT Acc Code (Do Not Modify): Observation [10022]       B Medical/Surgery History Past Medical History:  Diagnosis Date  . Depression    Past Surgical History:  Procedure Laterality Date  . COLON SURGERY    . HERNIA REPAIR       A IV Location/Drains/Wounds Patient Lines/Drains/Airways Status   Active Line/Drains/Airways    Name:   Placement date:   Placement time:   Site:   Days:   Peripheral IV 02/28/19 Right Antecubital   02/28/19    0350    Antecubital   less than 1          Intake/Output Last 24 hours No intake or output data in the 24 hours ending 02/28/19 6948  Labs/Imaging Results for orders placed or performed during the hospital encounter of 02/27/19 (from the past 48 hour(s))  Lipase, blood     Status: None   Collection Time: 02/27/19 10:48 PM  Result Value Ref Range   Lipase 30 11 - 51 U/L    Comment: Performed at [facility], [address] [place], [place] [address]  Comprehensive metabolic panel     Status: Abnormal   Collection Time: 02/27/19 10:48 PM  Result Value Ref Range   Sodium 137 135 - 145 mmol/L   Potassium 4.1 3.5 - 5.1 mmol/L   Chloride 100 98 - 111 mmol/L   CO2 28 22 - 32 mmol/L   Glucose, Bld 108 (H) 70 - 99 mg/dL   BUN 14 8 - 23 mg/dL   Creatinine, Ser 1.42 (H) 0.61 - 1.24 mg/dL   Calcium 9.4 8.9 - 10.3 mg/dL   Total Protein 7.0 6.5 - 8.1 g/dL   Albumin 3.4 (L) 3.5 - 5.0 g/dL   AST 73 (H) 15 - 41 U/L   ALT 105 (H) 0 - 44 U/L   Alkaline Phosphatase 277 (H) 38 - 126 U/L   Total Bilirubin 3.4 (H) 0.3 - 1.2 mg/dL   GFR calc non Af Amer 50 (L) >60 mL/min   GFR calc Af Amer 58 (L) >60 mL/min   Anion gap 9 5 - 15    Comment: Performed at [facility], [place] [address] [place], [place] [address]  CBC     Status: Abnormal   Collection Time: 02/27/19 10:48 PM  Result Value Ref Range   WBC 23.9 (  H) 4.0 - 10.5 K/uL   RBC 4.50 4.22 - 5.81 MIL/uL   Hemoglobin 14.7 13.0 - 17.0 g/dL   HCT 43.7 39.0 - 52.0 %   MCV 97.1 80.0 - 100.0 fL   MCH 32.7 26.0 - 34.0 pg   MCHC 33.6 30.0 - 36.0 g/dL   RDW 12.1 11.5 - 15.5 %   Platelets 213 150 - 400 K/uL   nRBC 0.0 0.0 - 0.2 %    Comment: Performed at [facility], [place] [address], [place], [place] [address]  Urinalysis, Routine w reflex microscopic     Status: Abnormal   Collection Time: 02/28/19  3:20 AM  Result Value Ref Range   Color, Urine AMBER (A) YELLOW    Comment: BIOCHEMICALS MAY BE AFFECTED BY COLOR   APPearance HAZY (A) CLEAR   Specific Gravity, Urine 1.026 1.005 - 1.030   pH 5.0 5.0 - 8.0   Glucose, UA NEGATIVE NEGATIVE mg/dL   Hgb urine dipstick SMALL (A) NEGATIVE   Bilirubin Urine SMALL (A) NEGATIVE   Ketones, ur 5 (A) NEGATIVE mg/dL   Protein, ur 100 (A) NEGATIVE mg/dL   Nitrite NEGATIVE NEGATIVE   Leukocytes,Ua NEGATIVE NEGATIVE   RBC / HPF 0-5 0 - 5 RBC/hpf   WBC, UA 0-5 0 - 5  WBC/hpf   Bacteria, UA NONE SEEN NONE SEEN   Squamous Epithelial / LPF 0-5 0 - 5   Mucus PRESENT    Hyaline Casts, UA PRESENT     Comment: Performed at [facility], [place] [address] [place], [place] [address]  Comprehensive metabolic panel     Status: Abnormal   Collection Time: 02/28/19  5:54 AM  Result Value Ref Range   Sodium 136 135 - 145 mmol/L   Potassium 3.8 3.5 - 5.1 mmol/L   Chloride 102 98 - 111 mmol/L   CO2 24 22 - 32 mmol/L   Glucose, Bld 97 70 - 99 mg/dL   BUN 14 8 - 23 mg/dL   Creatinine, Ser 1.16 0.61 - 1.24 mg/dL   Calcium 8.5 (L) 8.9 - 10.3 mg/dL   Total Protein 6.3 (L) 6.5 - 8.1 g/dL   Albumin 2.8 (L) 3.5 - 5.0 g/dL   AST 43 (H) 15 - 41 U/L   ALT 79 (H) 0 - 44 U/L   Alkaline Phosphatase 214 (H) 38 - 126 U/L   Total Bilirubin 4.0 (H) 0.3 - 1.2 mg/dL   GFR calc non Af Amer >60 >60 mL/min   GFR calc Af Amer >60 >60 mL/min   Anion gap 10 5 - 15    Comment: Performed at [facility], [place] [address] [place], [place] [address]   Dg Chest 2 View  Result Date: 02/27/2019 CLINICAL DATA:  Chest pain EXAM: CHEST - 2 VIEW COMPARISON:  12/16/2006 FINDINGS: Normal heart size, mediastinal contours, and pulmonary vascularity. Lungs clear. No infiltrate, pleural effusion or pneumothorax. Bones unremarkable. IMPRESSION: No acute abnormalities. Electronically Signed   By: Lavonia Dana M.D.   On: 02/27/2019 22:21   US Abdomen Limited Ruq  Result Date: 02/28/2019 CLINICAL DATA:  68 year old male with history of right upper quadrant abdominal pain today. Elevated bilirubin. History of gallstones. EXAM: ULTRASOUND ABDOMEN LIMITED RIGHT UPPER QUADRANT COMPARISON:  Abdominal ultrasound 01/19/2017. FINDINGS: Gallbladder: Multiple echogenic foci are present within the gallbladder, some of which demonstrate distal posterior acoustic shadowing, compatible with gallstones, largest of which measures up to 8 mm. In addition, there is more amorphous  echogenic material which does not  shadow, compatible with biliary sludge and biliary sludge balls. Gallbladder moderately distended. Gallbladder wall is thickened measuring 6 mm. No pericholecystic fluid. Per report from the sonographer there was a sonographic Murphy's sign on examination. Common bile duct: Diameter: 4 mm proximally.  Distal duct could not be visualized. Liver: No focal lesion identified. Within normal limits in parenchymal echogenicity. Portal vein is patent on color Doppler imaging with normal direction of blood flow towards the liver. IMPRESSION: 1. Gallstones, biliary sludge and tumefactive sludge balls in the gallbladder. Gallbladder is only moderately distended, however, there is wall thickening and a positive Murphy's sign on examination. Findings are concerning for early acute cholecystitis. Electronically Signed   By: Vinnie Langton M.D.   On: 02/28/2019 04:28    Pending Labs Unresulted Labs (From admission, onward)    Start     Ordered   02/28/19 0518  SARS Coronavirus 2 (CEPHEID - Performed in [facility] hospital lab), Hosp Order  (Asymptomatic Patients Labs)  Once,   STAT    Question:  Rule Out  Answer:  Yes   02/28/19 0517   Signed and Held  HIV antibody (Routine Testing)  Once,   R     Signed and Held   Signed and Held  Comprehensive metabolic panel  Tomorrow morning,   R     Signed and Held   Signed and Held  CBC  Tomorrow morning,   R     Signed and Held          Vitals/Pain Today's Vitals   02/28/19 0420 02/28/19 0530 02/28/19 0545 02/28/19 0614  BP:  140/88 (!) 142/86 (!) 148/78  Pulse:  78 76 70  Resp:   16 16  Temp:      TempSrc:      SpO2:  93% 97% 96%  Weight:      Height:      PainSc: 5        Isolation Precautions No active isolations  Medications Medications  morphine 4 MG/ML injection 4 mg (has no administration in time range)  ondansetron (ZOFRAN) injection 4 mg (has no administration in time range)  dextrose 5 % and 0.45 % NaCl with KCl 20 mEq/L infusion (has no  administration in time range)  sodium chloride 0.9 % bolus 2,000 mL (1,000 mLs Intravenous New Bag/Given 02/28/19 0351)  piperacillin-tazobactam (ZOSYN) IVPB 3.375 g (0 g Intravenous Stopped 02/28/19 0438)    Mobility walks Low fall risk   Focused Assessments GI   R Recommendations: See Admitting Provider Note  Report given to:   Additional Notes:  Gallbladder

## 2019-03-01 ENCOUNTER — Encounter (HOSPITAL_COMMUNITY): Payer: Self-pay | Admitting: Anesthesiology

## 2019-03-01 ENCOUNTER — Observation Stay (HOSPITAL_COMMUNITY): Payer: Medicare Other | Admitting: Anesthesiology

## 2019-03-01 ENCOUNTER — Observation Stay (HOSPITAL_COMMUNITY): Payer: Medicare Other

## 2019-03-01 ENCOUNTER — Encounter (HOSPITAL_COMMUNITY): Admission: EM | Disposition: A | Discharge status: Home / Self Care | Payer: Self-pay | Source: Home / Self Care

## 2019-03-01 DIAGNOSIS — K8001 Calculus of gallbladder with acute cholecystitis with obstruction: Secondary | ICD-10-CM | POA: Diagnosis not present

## 2019-03-01 DIAGNOSIS — C9110 Chronic lymphocytic leukemia of B-cell type not having achieved remission: Secondary | ICD-10-CM | POA: Diagnosis present

## 2019-03-01 DIAGNOSIS — I10 Essential (primary) hypertension: Secondary | ICD-10-CM | POA: Diagnosis present

## 2019-03-01 DIAGNOSIS — R935 Abnormal findings on diagnostic imaging of other abdominal regions, including retroperitoneum: Secondary | ICD-10-CM | POA: Diagnosis not present

## 2019-03-01 DIAGNOSIS — R945 Abnormal results of liver function studies: Secondary | ICD-10-CM | POA: Diagnosis not present

## 2019-03-01 DIAGNOSIS — K819 Cholecystitis, unspecified: Secondary | ICD-10-CM | POA: Diagnosis present

## 2019-03-01 DIAGNOSIS — Z20828 Contact with and (suspected) exposure to other viral communicable diseases: Secondary | ICD-10-CM | POA: Diagnosis present

## 2019-03-01 DIAGNOSIS — Z9049 Acquired absence of other specified parts of digestive tract: Secondary | ICD-10-CM | POA: Diagnosis not present

## 2019-03-01 DIAGNOSIS — N179 Acute kidney failure, unspecified: Secondary | ICD-10-CM | POA: Diagnosis present

## 2019-03-01 DIAGNOSIS — K8050 Calculus of bile duct without cholangitis or cholecystitis without obstruction: Secondary | ICD-10-CM | POA: Diagnosis not present

## 2019-03-01 DIAGNOSIS — K8062 Calculus of gallbladder and bile duct with acute cholecystitis without obstruction: Secondary | ICD-10-CM | POA: Diagnosis present

## 2019-03-01 DIAGNOSIS — D1803 Hemangioma of intra-abdominal structures: Secondary | ICD-10-CM | POA: Diagnosis present

## 2019-03-01 DIAGNOSIS — K219 Gastro-esophageal reflux disease without esophagitis: Secondary | ICD-10-CM | POA: Diagnosis present

## 2019-03-01 DIAGNOSIS — Z9889 Other specified postprocedural states: Secondary | ICD-10-CM | POA: Diagnosis not present

## 2019-03-01 DIAGNOSIS — Z86010 Personal history of colonic polyps: Secondary | ICD-10-CM | POA: Diagnosis not present

## 2019-03-01 DIAGNOSIS — Z79899 Other long term (current) drug therapy: Secondary | ICD-10-CM | POA: Diagnosis not present

## 2019-03-01 HISTORY — PX: ERCP: SHX60

## 2019-03-01 HISTORY — PX: REMOVAL OF STONES: SHX5545

## 2019-03-01 HISTORY — PX: SPHINCTEROTOMY: SHX5279

## 2019-03-01 HISTORY — PX: ENDOSCOPIC RETROGRADE CHOLANGIOPANCREATOGRAPHY (ERCP) WITH PROPOFOL: SHX5810

## 2019-03-01 LAB — COMPREHENSIVE METABOLIC PANEL
ALT: 60 U/L — ABNORMAL HIGH (ref 0–44)
AST: 34 U/L (ref 15–41)
Albumin: 2.6 g/dL — ABNORMAL LOW (ref 3.5–5.0)
Alkaline Phosphatase: 203 U/L — ABNORMAL HIGH (ref 38–126)
Anion gap: 9 (ref 5–15)
BUN: 8 mg/dL (ref 8–23)
CO2: 25 mmol/L (ref 22–32)
Calcium: 8.2 mg/dL — ABNORMAL LOW (ref 8.9–10.3)
Chloride: 102 mmol/L (ref 98–111)
Creatinine, Ser: 1.09 mg/dL (ref 0.61–1.24)
GFR calc Af Amer: >60 mL/min (ref >60)
GFR calc non Af Amer: >60 mL/min (ref >60)
Glucose, Bld: 112 mg/dL — ABNORMAL HIGH (ref 70–99)
Potassium: 3.5 mmol/L (ref 3.5–5.1)
Sodium: 136 mmol/L (ref 135–145)
Total Bilirubin: 3 mg/dL — ABNORMAL HIGH (ref 0.3–1.2)
Total Protein: 5.8 g/dL — ABNORMAL LOW (ref 6.5–8.1)

## 2019-03-01 LAB — CBC
HCT: 36.3 % — ABNORMAL LOW (ref 39.0–52.0)
Hemoglobin: 12.1 g/dL — ABNORMAL LOW (ref 13.0–17.0)
MCH: 32.5 pg (ref 26.0–34.0)
MCHC: 33.3 g/dL (ref 30.0–36.0)
MCV: 97.6 fL (ref 80.0–100.0)
Platelets: 210 10*3/uL (ref 150–400)
RBC: 3.72 MIL/uL — ABNORMAL LOW (ref 4.22–5.81)
RDW: 12.2 % (ref 11.5–15.5)
WBC: 19.2 10*3/uL — ABNORMAL HIGH (ref 4.0–10.5)
nRBC: 0 % (ref 0.0–0.2)

## 2019-03-01 LAB — PROTIME-INR
INR: 1.1 (ref 0.8–1.2)
Prothrombin Time: 14.4 seconds (ref 11.4–15.2)

## 2019-03-01 LAB — APTT: aPTT: 35 seconds (ref 24–36)

## 2019-03-01 LAB — HIV ANTIBODY (ROUTINE TESTING W REFLEX): HIV Screen 4th Generation wRfx: NONREACTIVE

## 2019-03-01 SURGERY — ENDOSCOPIC RETROGRADE CHOLANGIOPANCREATOGRAPHY (ERCP) WITH PROPOFOL
Anesthesia: General

## 2019-03-01 MED ORDER — LIDOCAINE 2% (20 MG/ML) 5 ML SYRINGE
INTRAMUSCULAR | Status: DC | PRN
  Administered 2019-03-01: 60 mg via INTRAVENOUS

## 2019-03-01 MED ORDER — MUPIROCIN 2 % EX OINT
1.0000 "application " | TOPICAL_OINTMENT | Freq: Two times a day (BID) | CUTANEOUS | Status: AC
  Administered 2019-03-01 – 2019-03-05 (×10): 1 via NASAL
  Filled 2019-03-01: qty 22

## 2019-03-01 MED ORDER — SODIUM CHLORIDE 0.9 % IV SOLN
INTRAVENOUS | Status: DC | PRN
  Administered 2019-03-01: 20 mL

## 2019-03-01 MED ORDER — MENTHOL 3 MG MT LOZG
1.0000 | LOZENGE | OROMUCOSAL | Status: DC | PRN
  Administered 2019-03-01: 14:00:00 3 mg via ORAL
  Filled 2019-03-01: qty 9

## 2019-03-01 MED ORDER — INDOMETHACIN 50 MG RE SUPP
RECTAL | Status: DC | PRN
  Administered 2019-03-01: 100 mg via RECTAL

## 2019-03-01 MED ORDER — LACTATED RINGERS IV SOLN
INTRAVENOUS | Status: DC | PRN
  Administered 2019-03-01: 11:00:00 via INTRAVENOUS

## 2019-03-01 MED ORDER — FENTANYL CITRATE (PF) 100 MCG/2ML IJ SOLN
INTRAMUSCULAR | Status: DC | PRN
  Administered 2019-03-01 (×2): 50 ug via INTRAVENOUS

## 2019-03-01 MED ORDER — KCL IN DEXTROSE-NACL 20-5-0.45 MEQ/L-%-% IV SOLN: INTRAVENOUS | Status: AC

## 2019-03-01 MED ORDER — SUCCINYLCHOLINE CHLORIDE 20 MG/ML IJ SOLN
INTRAMUSCULAR | Status: DC | PRN
  Administered 2019-03-01: 120 mg via INTRAVENOUS

## 2019-03-01 MED ORDER — CHLORHEXIDINE GLUCONATE CLOTH 2 % EX PADS
6.0000 | MEDICATED_PAD | Freq: Every day | CUTANEOUS | Status: AC
  Administered 2019-03-01 – 2019-03-05 (×5): 6 via TOPICAL

## 2019-03-01 MED ORDER — KCL IN DEXTROSE-NACL 40-5-0.9 MEQ/L-%-% IV SOLN
INTRAVENOUS | Status: DC
  Administered 2019-03-01 – 2019-03-05 (×9): via INTRAVENOUS
  Filled 2019-03-01 (×11): qty 1000

## 2019-03-01 MED ORDER — INDOMETHACIN 50 MG RE SUPP: 100.0000 mg | Freq: Once | RECTAL | Status: DC

## 2019-03-01 MED ORDER — GLUCAGON HCL RDNA (DIAGNOSTIC) 1 MG IJ SOLR
INTRAMUSCULAR | Status: AC
  Filled 2019-03-01: qty 1

## 2019-03-01 MED ORDER — MIDAZOLAM HCL 5 MG/5ML IJ SOLN
INTRAMUSCULAR | Status: DC | PRN
  Administered 2019-03-01: 2 mg via INTRAVENOUS

## 2019-03-01 MED ORDER — ROCURONIUM BROMIDE 10 MG/ML (PF) SYRINGE
PREFILLED_SYRINGE | INTRAVENOUS | Status: DC | PRN
  Administered 2019-03-01: 50 mg via INTRAVENOUS

## 2019-03-01 MED ORDER — PROPOFOL 10 MG/ML IV BOLUS
INTRAVENOUS | Status: DC | PRN
  Administered 2019-03-01: 180 mg via INTRAVENOUS

## 2019-03-01 MED ORDER — GLUCAGON HCL RDNA (DIAGNOSTIC) 1 MG IJ SOLR
INTRAMUSCULAR | Status: DC | PRN
  Administered 2019-03-01: 0.25 mg via INTRAVENOUS

## 2019-03-01 MED ORDER — KCL IN DEXTROSE-NACL 40-5-0.9 MEQ/L-%-% IV SOLN: INTRAVENOUS | Status: DC

## 2019-03-01 MED ORDER — SUGAMMADEX SODIUM 200 MG/2ML IV SOLN
INTRAVENOUS | Status: DC | PRN
  Administered 2019-03-01: 200 mg via INTRAVENOUS

## 2019-03-01 MED ORDER — INDOMETHACIN 50 MG RE SUPP
RECTAL | Status: AC
  Filled 2019-03-01: qty 2

## 2019-03-01 MED ORDER — PHENYLEPHRINE 40 MCG/ML (10ML) SYRINGE FOR IV PUSH (FOR BLOOD PRESSURE SUPPORT)
PREFILLED_SYRINGE | INTRAVENOUS | Status: DC | PRN
  Administered 2019-03-01 (×2): 80 ug via INTRAVENOUS

## 2019-03-01 NOTE — Anesthesia Procedure Notes (Signed)
Procedure Name: Intubation Date/Time: 03/01/2019 10:58 AM Performed by: Lieutenant Diego, CRNA Pre-anesthesia Checklist: Patient identified, Emergency Drugs available, Suction available and Patient being monitored Patient Re-evaluated:Patient Re-evaluated prior to induction Oxygen Delivery Method: Circle system utilized Preoxygenation: Pre-oxygenation with 100% oxygen Induction Type: IV induction Ventilation: Mask ventilation without difficulty Laryngoscope Size: Miller and 2 Grade View: Grade I Tube type: Oral Tube size: 7.5 mm Number of attempts: 1 Airway Equipment and Method: Stylet Placement Confirmation: ETT inserted through vocal cords under direct vision,  positive ETCO2 and breath sounds checked- equal and bilateral Secured at: 22 cm Tube secured with: Tape Dental Injury: Teeth and Oropharynx as per pre-operative assessment

## 2019-03-01 NOTE — Consult Note (Signed)
Chief Complaint: Patient was seen in consultation today for percutaneous cholecystostomy placement.  Referring Physician(s): Earnstine Regal, PA-C  Supervising Physician: Markus Daft  Patient Status: [facility] - In-pt  History of Present Illness: Jonathon Parker is a 68 y.o. male with a past medical history of significant for depression, CLL currently on observation only and HTN who presented to [facility] ED yesterday morning with complaints of RUQ abdominal pain, nausea and poor appetite. A RUQ [place] was performed which showed gallstones, biliary sludge and tumefactive sludge balls in the gallbladder with moderate distention and wall thickening, concerning for early acute cholecystitis. A follow up MRCP was performed showing mildly distended gallbladder with small stones and diffuse wall thickening, mild biliary ductal dilatation with at least 2 tiny calculi in the distal CBD. He was admitted for further evaluation and management. General surgery was consulted who planned to continue fluid resuscitation and antibiotics with possible laparoscopic cholecystectomy the following day. GI was also consulted and patient underwent ERCP today which showed choledocholithiasis - complete removal was accomplished by biliary sphincterotomy and balloon sweep per report. Request has been made to IR for placement percutaneous cholecystostomy.  Patient groggy from sedation post ERCP but states he is aware he will need a tube in his gallbladder. When asked if he's having any pain he states "I'm not really sure, I don't think so." He denies any nausea or vomiting, he's hoping to be able to drink something soon.   Past Medical History:  Diagnosis Date   Depression     Past Surgical History:  Procedure Laterality Date   COLON SURGERY     HERNIA REPAIR      Allergies: Patient has no known allergies.  Medications: Prior to Admission medications   Medication Sig Start Date End Date Taking? Authorizing Provider    ALPRAZolam Duanne Moron) 0.25 MG tablet Take 0.25 mg by mouth at bedtime as needed for anxiety.   Yes [provider]  buPROPion (WELLBUTRIN XL) 300 MG 24 hr tablet Take 300 mg by mouth at bedtime.   Yes [provider]  esomeprazole (NEXIUM) 40 MG capsule Take 40 mg by mouth daily.    Yes [provider]  ibuprofen (ADVIL,MOTRIN) 200 MG tablet Take 800 mg by mouth every 6 (six) hours as needed for fever or mild pain.   Yes [provider]  lisinopril (ZESTRIL) 20 MG tablet Take 20 mg by mouth daily.   Yes [provider]  traZODone (DESYREL) 100 MG tablet Take 100 mg by mouth at bedtime.   Yes [provider]  HYDROcodone-acetaminophen (NORCO/VICODIN) 5-325 MG tablet Take 2 tablets by mouth every 4 (four) hours as needed. Patient not taking: Reported on 02/28/2019 01/16/17   Tanna Furry, MD  ondansetron (ZOFRAN ODT) 4 MG disintegrating tablet Take 1 tablet (4 mg total) by mouth every 8 (eight) hours as needed for nausea. Patient not taking: Reported on 02/28/2019 01/16/17   Tanna Furry, MD  saccharomyces boulardii (FLORASTOR) 250 MG capsule Take 1 capsule (250 mg total) by mouth 2 (two) times daily. Patient not taking: Reported on 02/28/2019 01/23/17   Lavina Hamman, MD     History reviewed. No pertinent family history.  Social History   Socioeconomic History   Marital status: Married    Spouse name: Not on file   Number of children: Not on file   Years of education: Not on file   Highest education level: Not on file  Occupational History   Not on file  Social  Needs   Financial resource strain: Not on file   Food insecurity    Worry: Not on file    Inability: Not on file   Transportation needs    Medical: Not on file    Non-medical: Not on file  Tobacco Use   Smoking status: Never Smoker   Smokeless tobacco: Never Used  Substance and Sexual Activity   Alcohol use: Yes   Drug use: No   Sexual activity: Yes  Lifestyle    Physical activity    Days per week: Not on file    Minutes per session: Not on file   Stress: Not on file  Relationships   Social connections    Talks on phone: Not on file    Gets together: Not on file    Attends religious service: Not on file    Active member of club or organization: Not on file    Attends meetings of clubs or organizations: Not on file    Relationship status: Not on file  Other Topics Concern   Not on file  Social History Narrative   Not on file     Review of Systems: A 12 point ROS discussed and pertinent positives are indicated in the HPI above.  All other systems are negative.  Review of Systems  Constitutional: Positive for appetite change, chills (overnight) and fatigue (after sedation ). Negative for fever.  Respiratory: Negative for cough and shortness of breath.   Cardiovascular: Negative for chest pain.  Gastrointestinal: Negative for abdominal pain, blood in stool, diarrhea, nausea and vomiting.  Genitourinary: Negative for dysuria and hematuria.  Musculoskeletal: Negative for back pain.  Skin: Negative for rash.  Neurological: Negative for dizziness.    Vital Signs: BP (!) 165/89 (BP Location: Left Arm)    Pulse 60    Temp 98.4 F (36.9 C) (Oral)    Resp 20    Ht 6' (1.829 m)    Wt 193 lb 5.5 oz (87.7 kg)    SpO2 99%    BMI 26.22 kg/m   Physical Exam Vitals signs and nursing note reviewed.  Constitutional:      General: He is not in acute distress.    Comments: Groggy but alert, pleasant, good historian.  HENT:     Head: Normocephalic.  Eyes:     General: No scleral icterus. Cardiovascular:     Rate and Rhythm: Normal rate and regular rhythm.  Pulmonary:     Effort: Pulmonary effort is normal.     Breath sounds: Normal breath sounds.  Abdominal:     General: Bowel sounds are normal. There is no distension.     Palpations: Abdomen is soft.     Tenderness: There is no abdominal tenderness.  Skin:    General: Skin is warm and  dry.     Coloration: Skin is not jaundiced.  Neurological:     Mental Status: He is alert and oriented to person, place, and time.  Psychiatric:        Mood and Affect: Mood normal.        Behavior: Behavior normal.        Thought Content: Thought content normal.        Judgment: Judgment normal.      MD Evaluation Airway: WNL Heart: WNL Abdomen: Other (comments) Abdomen comments: mild TTP ASA  Classification: 3 Mallampati/Airway Score: Two   Imaging: Dg Chest 2 View  Result Date: 02/27/2019 CLINICAL DATA:  Chest pain EXAM: CHEST - 2 VIEW  COMPARISON:  12/16/2006 FINDINGS: Normal heart size, mediastinal contours, and pulmonary vascularity. Lungs clear. No infiltrate, pleural effusion or pneumothorax. Bones unremarkable. IMPRESSION: No acute abnormalities. Electronically Signed   By: Lavonia Dana M.D.   On: 02/27/2019 22:21   Mr 3d Recon At Scanner  Result Date: 02/28/2019 CLINICAL DATA:  Elevated liver tests. Right upper quadrant pain. Cholelithiasis. EXAM: MRI ABDOMEN WITHOUT AND WITH CONTRAST (INCLUDING MRCP) TECHNIQUE: Multiplanar multisequence MR imaging of the abdomen was performed both before and after the administration of intravenous contrast. Heavily T2-weighted images of the biliary and pancreatic ducts were obtained, and three-dimensional MRCP images were rendered by post processing. CONTRAST:  8 mL Gadavist COMPARISON:  02/13/2017 FINDINGS: Lower chest: Small right pleural effusion and mild right basilar atelectasis noted. Hepatobiliary: A 1.5 cm benign hemangioma is seen in segment 4 B, adjacent to the gallbladder fossa. This is stable since previous study. No other liver masses are identified. Small gallstones are seen. Gallbladder is mildly dilated and shows diffuse gallbladder wall thickening, highly suspicious for acute cholecystitis. Mild diffuse biliary ductal dilatation seen with common bile duct measuring 8 mm in diameter. At least 2 calculi are seen in the distal  common bile duct, largest measuring 6 mm. Pancreas: No mass or inflammatory changes. No evidence of pancreatic ductal dilatation or pancreas divisum. Spleen:  Within normal limits in size and appearance. Adrenals/Urinary Tract: No masses identified. No evidence of hydronephrosis. Stomach/Bowel: Visualized portion unremarkable. Vascular/Lymphatic: No pathologically enlarged lymph nodes identified. No abdominal aortic aneurysm. Other:  None. Musculoskeletal:  No suspicious bone lesions identified. IMPRESSION: Mildly distended gallbladder with small stones and diffuse wall thickening, highly suspicious for acute cholecystitis. Mild biliary ductal dilatation, with at least 2 tiny calculi in the distal common bile duct, largest measuring 6 mm. Stable small benign hemangioma in the left hepatic lobe. Small right pleural effusion and mild right basilar atelectasis. Electronically Signed   By: Marlaine Hind M.D.   On: 02/28/2019 15:21   Dg Ercp Biliary & Pancreatic Ducts  Result Date: 03/01/2019 CLINICAL DATA:  68 year old male with a history of transaminitis EXAM: ERCP TECHNIQUE: Multiple spot images obtained with the fluoroscopic device and submitted for interpretation post-procedure. FLUOROSCOPY TIME:  Fluoroscopy Time:  34 seconds COMPARISON:  MR February 28, 2019 FINDINGS: Limited fluoroscopic spot images and sin a images from ERCP. Initial image demonstrates endoscope projecting over the upper abdomen. There is linear radiopaque structure projecting over the liver of uncertain significance, possibly overlying the patient. Subsequently there is cannulation of the ampulla and retrograde infusion of contrast partially opacifying the biliary ducts. There is deployment of a retrieval balloon. IMPRESSION: Limited images during ERCP demonstrates partial opacification of the extrahepatic biliary ducts and deployment of a retrieval balloon. Please refer to the dictated operative report for full details of intraoperative  findings and procedure. Electronically Signed   By: Corrie Mckusick D.O.   On: 03/01/2019 12:50   Mr Abdomen Mrcp Moise Boring Contast  Result Date: 02/28/2019 CLINICAL DATA:  Elevated liver tests. Right upper quadrant pain. Cholelithiasis. EXAM: MRI ABDOMEN WITHOUT AND WITH CONTRAST (INCLUDING MRCP) TECHNIQUE: Multiplanar multisequence MR imaging of the abdomen was performed both before and after the administration of intravenous contrast. Heavily T2-weighted images of the biliary and pancreatic ducts were obtained, and three-dimensional MRCP images were rendered by post processing. CONTRAST:  8 mL Gadavist COMPARISON:  02/13/2017 FINDINGS: Lower chest: Small right pleural effusion and mild right basilar atelectasis noted. Hepatobiliary: A 1.5 cm benign hemangioma is seen in segment  4 B, adjacent to the gallbladder fossa. This is stable since previous study. No other liver masses are identified. Small gallstones are seen. Gallbladder is mildly dilated and shows diffuse gallbladder wall thickening, highly suspicious for acute cholecystitis. Mild diffuse biliary ductal dilatation seen with common bile duct measuring 8 mm in diameter. At least 2 calculi are seen in the distal common bile duct, largest measuring 6 mm. Pancreas: No mass or inflammatory changes. No evidence of pancreatic ductal dilatation or pancreas divisum. Spleen:  Within normal limits in size and appearance. Adrenals/Urinary Tract: No masses identified. No evidence of hydronephrosis. Stomach/Bowel: Visualized portion unremarkable. Vascular/Lymphatic: No pathologically enlarged lymph nodes identified. No abdominal aortic aneurysm. Other:  None. Musculoskeletal:  No suspicious bone lesions identified. IMPRESSION: Mildly distended gallbladder with small stones and diffuse wall thickening, highly suspicious for acute cholecystitis. Mild biliary ductal dilatation, with at least 2 tiny calculi in the distal common bile duct, largest measuring 6 mm. Stable small  benign hemangioma in the left hepatic lobe. Small right pleural effusion and mild right basilar atelectasis. Electronically Signed   By: Marlaine Hind M.D.   On: 02/28/2019 15:21   US Abdomen Limited Ruq  Result Date: 02/28/2019 CLINICAL DATA:  68 year old male with history of right upper quadrant abdominal pain today. Elevated bilirubin. History of gallstones. EXAM: ULTRASOUND ABDOMEN LIMITED RIGHT UPPER QUADRANT COMPARISON:  Abdominal ultrasound 01/19/2017. FINDINGS: Gallbladder: Multiple echogenic foci are present within the gallbladder, some of which demonstrate distal posterior acoustic shadowing, compatible with gallstones, largest of which measures up to 8 mm. In addition, there is more amorphous echogenic material which does not shadow, compatible with biliary sludge and biliary sludge balls. Gallbladder moderately distended. Gallbladder wall is thickened measuring 6 mm. No pericholecystic fluid. Per report from the sonographer there was a sonographic Murphy's sign on examination. Common bile duct: Diameter: 4 mm proximally.  Distal duct could not be visualized. Liver: No focal lesion identified. Within normal limits in parenchymal echogenicity. Portal vein is patent on color Doppler imaging with normal direction of blood flow towards the liver. IMPRESSION: 1. Gallstones, biliary sludge and tumefactive sludge balls in the gallbladder. Gallbladder is only moderately distended, however, there is wall thickening and a positive Murphy's sign on examination. Findings are concerning for early acute cholecystitis. Electronically Signed   By: Vinnie Langton M.D.   On: 02/28/2019 04:28    Labs:  CBC: Recent Labs    02/27/19 2248 03/01/19 0254  WBC 23.9* 19.2*  HGB 14.7 12.1*  HCT 43.7 36.3*  PLT 213 210    COAGS: Recent Labs    03/01/19 0254 03/01/19 0959  INR 1.1  --   APTT  --  35    BMP: Recent Labs    02/27/19 2248 02/28/19 0554 03/01/19 0254  NA 137 136 136  K 4.1 3.8 3.5    CL 100 102 102  CO2 28 24 25   GLUCOSE 108* 97 112*  BUN 14 14 8   CALCIUM 9.4 8.5* 8.2*  CREATININE 1.42* 1.16 1.09  GFRNONAA 50* >60 >60  GFRAA 58* >60 >60    LIVER FUNCTION TESTS: Recent Labs    02/27/19 2248 02/28/19 0554 03/01/19 0254  BILITOT 3.4* 4.0* 3.0*  AST 73* 43* 34  ALT 105* 79* 60*  ALKPHOS 277* 214* 203*  PROT 7.0 6.3* 5.8*  ALBUMIN 3.4* 2.8* 2.6*    TUMOR MARKERS: No results for input(s): AFPTM, CEA, CA199, CHROMGRNA in the last 8760 hours.  Assessment and Plan:  68 y/o M  with acute calculous cholecystitis who presented to [facility] ED yesterday and has since undergone ERCP showing choledocholithiasis which was completely removed by biliary sphincterotomy and balloon sweep. After discussion with general surgery post ERCP it was felt best to proceed with percutaneous cholecystostomy placement at this time.   Will plan for percutaneous cholecystomy placement 7/28 - I have placed orders for patient to be NPO after midnight and to hold Lovenox tonight. Patient groggy from sedation on exam today post ERCP, he does state understanding and agree to procedure however will hold on consent signing at this time - patient to sign consent in IR tomorrow, which he is agreeable to.  Please call IR with questions or concerns.   Thank you for this interesting consult.  I greatly enjoyed meeting [facility] and look forward to participating in their care.  A copy of this report was sent to the requesting provider on this date.  Electronically Signed: Joaquim Nam, PA-C 03/01/2019, 2:12 PM   I spent a total of 40 Minutes in face to face in clinical consultation, greater than 50% of which was counseling/coordinating care for percutaneous cholecystotomy placement.

## 2019-03-01 NOTE — Consult Note (Signed)
[facility] Consult: 8:16 AM 03/01/2019  LOS: 0 days    Referring Provider: Dr Kae Heller  Primary Care Physician:  Nicoletta Dress, MD in [facility] Primary Gastroenterologist:  Dr. Lyndel Safe.       Reason for Consultation:  Positive MRCP   HPI: Jonathon Parker is a 68 y.o. male.  PMH GERD.  S/p partial colonic resection of a large,"precancerous" mass/polyp at age 48.  Latest surveillance colonoscopy ~ 2015, no recurrent polyps.  Previous EGD with GERD.   GB stones and sludge, slight elevation LFTs in 01/2017 at time of diarrheal illness, stool studies positive for Campylobacter and E coli with a Shigella like toxin, treated with Azithromycin.  CLL dc fall 2019, being observed/no treatment so far.  Remote appendectomy.  Lap bil inguinal hernia repair 2016.    Beginning last weekend, patient developed pain in the right upper quadrant, nausea vomiting.  Has eaten very little since the onset of symptoms.  No diarrhea.  He came to the ER 7/25.  Abdominal ultrasound: GB stones/sludge, moderate GB distention, wall thickening, positive Murphy sign.  Concern for early cholecystitis.  CBD 4 mm with nonvisualized distal duct. MRI/MRCP:   Mild GB distention, small stones, diffuse wall thickening suspicious for acute cholecystitis.  Stones at distal CBD, largest 6 mm.  CBD 8 mm.  Hemangioma in the liver, stable compared with previous.  Chris benign. T bili 4 >> 3.  Alk phos 277 >> 203.  AST/ALT 73/105 >> 34/60.  Lipase 30 WBCs >> 23.0 >> 19.2 Covid 19 negative.   No fever.  HR, BP, O2 sats stable  Surgeons plan percutaneous cholecystostomy.  Drinks maybe 1 alcoholic beverage daily.  Does not smoke or do illicit drugs.  Past Medical History:  Diagnosis Date   Depression     Past Surgical History:  Procedure Laterality  Date   COLON SURGERY     HERNIA REPAIR      Prior to Admission medications   Medication Sig Start Date End Date Taking? Authorizing Provider  ALPRAZolam Duanne Moron) 0.25 MG tablet Take 0.25 mg by mouth at bedtime as needed for anxiety.   Yes [provider]  buPROPion (WELLBUTRIN XL) 300 MG 24 hr tablet Take 300 mg by mouth at bedtime.   Yes [provider]  esomeprazole (NEXIUM) 40 MG capsule Take 40 mg by mouth daily.    Yes [provider]  ibuprofen (ADVIL,MOTRIN) 200 MG tablet Take 800 mg by mouth every 6 (six) hours as needed for fever or mild  pain.   Yes [provider]  lisinopril (ZESTRIL) 20 MG tablet Take 20 mg by mouth daily.   Yes [provider]  traZODone (DESYREL) 100 MG tablet Take 100 mg by mouth at bedtime.   Yes [provider]    Scheduled Meds:  buPROPion  300 mg Oral QHS   Chlorhexidine Gluconate Cloth  6 each Topical Daily   enoxaparin (LOVENOX) injection  40 mg Subcutaneous Q24H   mupirocin ointment  1 application Nasal BID   pantoprazole (PROTONIX) IV  40 mg Intravenous QHS   Infusions:  dextrose 5 % and 0.45 % NaCl with KCl 20 mEq/L 125 mL/hr at 03/01/19 0300   piperacillin-tazobactam (ZOSYN)  IV 3.375 g (03/01/19 0552)   PRN Meds: acetaminophen **OR** acetaminophen, ALPRAZolam, diphenhydrAMINE **OR** diphenhydrAMINE, morphine injection, ondansetron (ZOFRAN) IV, oxyCODONE, promethazine, traZODone   Allergies as of 02/27/2019   (No Known Allergies)    History reviewed. No pertinent family history.  Social History   Socioeconomic History   Marital status: Married    Spouse name: Not on file   Number of children: Not on file   Years of education: Not on file   Highest education level: Not on file  Occupational History   Not on file  Social Needs   Financial resource strain: Not on file   Food insecurity    Worry: Not on file    Inability: Not on file   Transportation needs     Medical: Not on file    Non-medical: Not on file  Tobacco Use   Smoking status: Never Smoker   Smokeless tobacco: Never Used  Substance and Sexual Activity   Alcohol use: Yes   Drug use: No   Sexual activity: Yes  Lifestyle   Physical activity    Days per week: Not on file    Minutes per session: Not on file   Stress: Not on file  Relationships   Social connections    Talks on phone: Not on file    Gets together: Not on file    Attends religious service: Not on file    Active member of club or organization: Not on file    Attends meetings of clubs or organizations: Not on file    Relationship status: Not on file   Intimate partner violence    Fear of current or ex partner: Not on file    Emotionally abused: Not on file    Physically abused: Not on file    Forced sexual activity: Not on file  Other Topics Concern   Not on file  Social History Narrative   Not on file    REVIEW OF SYSTEMS: Constitutional: Weakness ENT:  No nose bleeds Pulm: No shortness of breath. CV:  No palpitations, no LE edema.  No chest pain. GU:  No hematuria, no frequency GI: See HPI.  Generally not having a lot of reflux symptoms, not on PPI at home. Heme: No excessive or unusual bleeding or bruising. Transfusions: None. Neuro:  No headaches, no peripheral tingling or numbness.  No syncope.  No seizures. Derm:  No itching, no rash or sores.  Endocrine:  No sweats or chills.  No polyuria or dysuria Immunization: Not queried. Travel:  None beyond local counties in last few months.    PHYSICAL EXAM: Vital signs in last 24 hours: Vitals:   02/28/19 1953 03/01/19 0444  BP: (!) 149/83 138/77  Pulse: 63 62  Resp: 19 19  Temp: 98.3 F (36.8 C)  98.8 F (37.1 C)  SpO2: 98% 95%   Wt Readings from Last 3 Encounters:  02/28/19 87.7 kg  01/19/17 84.1 kg  01/16/17 78.9 kg    General: Pleasant, healthy-appearing, comfortable WM.  Looks younger than stated age Head: No facial  asymmetry or swelling. Eyes: Slight scleral icterus.  EOMI.  No conjunctival pallor. Ears: Not hard of hearing. Nose: No discharge or congestion Mouth: Oropharynx moist, pink, clear.  Tongue midline. Neck: No JVD, no masses, no thyromegaly. Lungs: Clear bilaterally.  No labored breathing.  No cough. Heart: RRR.  No MRG.  S1, S2 present. Abdomen: Soft.  Minimal if any tenderness in the upper right abdomen.  Bowel sounds active.  No distention..   Rectal: Deferred Musc/Skeltl: No joint redness, swelling or gross deformity. Extremities: No CCE. Neurologic: Oriented x3.  Fully alert.  No tremors, no gross deficits or weakness. Skin: No rash, no sores.  Suntanned so unable to appreciate jaundice. Tattoos: None observed. Nodes: No cervical adenopathy. Psych: Pleasant, cooperative, calm.  Fluid speech.  Intake/Output from previous day: 07/26 0701 - 07/27 0700 In: 2700.6 [P.O.:236; I.V.:2315.8; IV Piggyback:148.8] Out: 2100 [Urine:2100] Intake/Output this shift: No intake/output data recorded.  LAB RESULTS: Recent Labs    02/27/19 2248 03/01/19 0254  WBC 23.9* 19.2*  HGB 14.7 12.1*  HCT 43.7 36.3*  PLT 213 210   BMET Lab Results  Component Value Date   NA 136 03/01/2019   NA 136 02/28/2019   NA 137 02/27/2019   K 3.5 03/01/2019   K 3.8 02/28/2019   K 4.1 02/27/2019   CL 102 03/01/2019   CL 102 02/28/2019   CL 100 02/27/2019   CO2 25 03/01/2019   CO2 24 02/28/2019   CO2 28 02/27/2019   GLUCOSE 112 (H) 03/01/2019   GLUCOSE 97 02/28/2019   GLUCOSE 108 (H) 02/27/2019   BUN 8 03/01/2019   BUN 14 02/28/2019   BUN 14 02/27/2019   CREATININE 1.09 03/01/2019   CREATININE 1.16 02/28/2019   CREATININE 1.42 (H) 02/27/2019   CALCIUM 8.2 (L) 03/01/2019   CALCIUM 8.5 (L) 02/28/2019   CALCIUM 9.4 02/27/2019   LFT Recent Labs    02/27/19 2248 02/28/19 0554 03/01/19 0254  PROT 7.0 6.3* 5.8*  ALBUMIN 3.4* 2.8* 2.6*  AST 73* 43* 34  ALT 105* 79* 60*  ALKPHOS 277* 214*  203*  BILITOT 3.4* 4.0* 3.0*   PT/INR Lab Results  Component Value Date   INR 1.1 03/01/2019   Hepatitis Panel No results for input(s): HEPBSAG, HCVAB, HEPAIGM, HEPBIGM in the last 72 hours. C-Diff No components found for: CDIFF Lipase     Component Value Date/Time   LIPASE 30 02/27/2019 2248    RADIOLOGY STUDIES: Dg Chest 2 View  Result Date: 02/27/2019 CLINICAL DATA:  Chest pain EXAM: CHEST - 2 VIEW COMPARISON:  12/16/2006 FINDINGS: Normal heart size, mediastinal contours, and pulmonary vascularity. Lungs clear. No infiltrate, pleural effusion or pneumothorax. Bones unremarkable. IMPRESSION: No acute abnormalities. Electronically Signed   By: Lavonia Dana M.D.   On: 02/27/2019 22:21   Mr 3d Recon At Scanner  Result Date: 02/28/2019 CLINICAL DATA:  Elevated liver tests. Right upper quadrant pain. Cholelithiasis. EXAM: MRI ABDOMEN WITHOUT AND WITH CONTRAST (INCLUDING MRCP) TECHNIQUE: Multiplanar multisequence MR imaging of the abdomen was performed both before and after the administration of intravenous contrast. Heavily T2-weighted images of the biliary and pancreatic ducts were obtained, and three-dimensional MRCP images were rendered by post processing. CONTRAST:  8 mL Gadavist COMPARISON:  02/13/2017 FINDINGS: Lower chest: Small right pleural effusion and mild right basilar atelectasis noted. Hepatobiliary: A 1.5 cm benign hemangioma is seen in segment 4 B, adjacent to the gallbladder fossa. This is stable since previous study. No other liver masses are identified. Small gallstones are seen. Gallbladder is mildly dilated and shows diffuse gallbladder wall thickening, highly suspicious for acute cholecystitis. Mild diffuse biliary ductal dilatation seen with common bile duct measuring 8 mm in diameter. At least 2 calculi are seen in the distal common bile duct, largest measuring 6 mm. Pancreas: No mass or inflammatory changes. No evidence of pancreatic ductal dilatation or pancreas  divisum. Spleen:  Within normal limits in size and appearance. Adrenals/Urinary Tract: No masses identified. No evidence of hydronephrosis. Stomach/Bowel: Visualized portion unremarkable. Vascular/Lymphatic: No pathologically enlarged lymph nodes identified. No abdominal aortic aneurysm. Other:  None. Musculoskeletal:  No suspicious bone lesions identified. IMPRESSION: Mildly distended gallbladder with small stones and diffuse wall thickening, highly suspicious for acute cholecystitis. Mild biliary ductal dilatation, with at least 2 tiny calculi in the distal common bile duct, largest measuring 6 mm. Stable small benign hemangioma in the left hepatic lobe. Small right pleural effusion and mild right basilar atelectasis. Electronically Signed   By: Marlaine Hind M.D.   On: 02/28/2019 15:21   Mr Abdomen Mrcp Moise Boring Contast  Result Date: 02/28/2019 CLINICAL DATA:  Elevated liver tests. Right upper quadrant pain. Cholelithiasis. EXAM: MRI ABDOMEN WITHOUT AND WITH CONTRAST (INCLUDING MRCP) TECHNIQUE: Multiplanar multisequence MR imaging of the abdomen was performed both before and after the administration of intravenous contrast. Heavily T2-weighted images of the biliary and pancreatic ducts were obtained, and three-dimensional MRCP images were rendered by post processing. CONTRAST:  8 mL Gadavist COMPARISON:  02/13/2017 FINDINGS: Lower chest: Small right pleural effusion and mild right basilar atelectasis noted. Hepatobiliary: A 1.5 cm benign hemangioma is seen in segment 4 B, adjacent to the gallbladder fossa. This is stable since previous study. No other liver masses are identified. Small gallstones are seen. Gallbladder is mildly dilated and shows diffuse gallbladder wall thickening, highly suspicious for acute cholecystitis. Mild diffuse biliary ductal dilatation seen with common bile duct measuring 8 mm in diameter. At least 2 calculi are seen in the distal common bile duct, largest measuring 6 mm. Pancreas: No  mass or inflammatory changes. No evidence of pancreatic ductal dilatation or pancreas divisum. Spleen:  Within normal limits in size and appearance. Adrenals/Urinary Tract: No masses identified. No evidence of hydronephrosis. Stomach/Bowel: Visualized portion unremarkable. Vascular/Lymphatic: No pathologically enlarged lymph nodes identified. No abdominal aortic aneurysm. Other:  None. Musculoskeletal:  No suspicious bone lesions identified. IMPRESSION: Mildly distended gallbladder with small stones and diffuse wall thickening, highly suspicious for acute cholecystitis. Mild biliary ductal dilatation, with at least 2 tiny calculi in the distal common bile duct, largest measuring 6 mm. Stable small benign hemangioma in the left hepatic lobe. Small right pleural effusion and mild right basilar atelectasis. Electronically Signed   By: Marlaine Hind M.D.   On: 02/28/2019 15:21   US Abdomen Limited Ruq  Result Date: 02/28/2019 CLINICAL DATA:  68 year old male with history of right upper quadrant abdominal pain today. Elevated bilirubin. History of gallstones. EXAM: ULTRASOUND ABDOMEN LIMITED RIGHT UPPER QUADRANT COMPARISON:  Abdominal ultrasound 01/19/2017. FINDINGS: Gallbladder: Multiple echogenic foci are present within the gallbladder, some of which demonstrate distal posterior acoustic shadowing, compatible with gallstones, largest of which measures up to 8 mm. In addition, there is more amorphous echogenic material which does not shadow,  compatible with biliary sludge and biliary sludge balls. Gallbladder moderately distended. Gallbladder wall is thickened measuring 6 mm. No pericholecystic fluid. Per report from the sonographer there was a sonographic Murphy's sign on examination. Common bile duct: Diameter: 4 mm proximally.  Distal duct could not be visualized. Liver: No focal lesion identified. Within normal limits in parenchymal echogenicity. Portal vein is patent on color Doppler imaging with normal  direction of blood flow towards the liver. IMPRESSION: 1. Gallstones, biliary sludge and tumefactive sludge balls in the gallbladder. Gallbladder is only moderately distended, however, there is wall thickening and a positive Murphy's sign on examination. Findings are concerning for early acute cholecystitis. Electronically Signed   By: Vinnie Langton M.D.   On: 02/28/2019 04:28     IMPRESSION:   *   Acute cholecystitis.    *    Choledocholithiasis  *    Hx precancerous colon polyp, colon resection age 48.  Last Colonoscopy ~ 2015     PLAN:     *      ERCP today.  Risks benefits discussed with the patient using phone based anatomy cartoon.  Patient is n.p.o. and on scheduled Zosyn.  *      Percutaneous cholecystostomy tube planned by general surgery.  *    Eventually needs to undergo repeat surveillance colonoscopy, defer this to the outpatient setting.   Azucena Freed  03/01/2019, 8:16 AM Phone [phone]

## 2019-03-01 NOTE — Anesthesia Preprocedure Evaluation (Signed)
Anesthesia Evaluation  Patient identified by MRN, date of birth, ID band Patient awake    Reviewed: Allergy & Precautions, H&P , NPO status , Patient's Chart, lab work & pertinent test results  Airway Mallampati: II   Neck ROM: full    Dental   Pulmonary neg pulmonary ROS,    breath sounds clear to auscultation       Cardiovascular negative cardio ROS   Rhythm:regular Rate:Normal     Neuro/Psych PSYCHIATRIC DISORDERS Depression    GI/Hepatic GERD  ,  Endo/Other    Renal/GU      Musculoskeletal   Abdominal   Peds  Hematology   Anesthesia Other Findings   Reproductive/Obstetrics                             Anesthesia Physical Anesthesia Plan  ASA: II  Anesthesia Plan: General   Post-op Pain Management:    Induction: Intravenous  PONV Risk Score and Plan: 2 and Ondansetron, Dexamethasone, Midazolam and Treatment may vary due to age or medical condition  Airway Management Planned: Oral ETT  Additional Equipment:   Intra-op Plan:   Post-operative Plan: Extubation in OR  Informed Consent: I have reviewed the patients History and Physical, chart, labs and discussed the procedure including the risks, benefits and alternatives for the proposed anesthesia with the patient or authorized representative who has indicated his/her understanding and acceptance.       Plan Discussed with: CRNA, Anesthesiologist and Surgeon  Anesthesia Plan Comments:         Anesthesia Quick Evaluation

## 2019-03-01 NOTE — Progress Notes (Addendum)
CC: Abdominal cramping  Subjective: Patient still having pain he says is 6 or 7/10.  He does not complain much of pain normally.  He has not eaten since last week.  Has had ongoing pain since Saturday a week ago.  He has been evaluated by GI and they are working on scheduling him for an ERCP.  He is currently n.p.o.  Objective: Vital signs in last 24 hours: Temp:  [98.3 F (36.8 C)-98.9 F (37.2 C)] 98.8 F (37.1 C) (07/27 0444) Pulse Rate:  [62-64] 62 (07/27 0444) Resp:  [19-20] 19 (07/27 0444) BP: (138-157)/(77-83) 138/77 (07/27 0444) SpO2:  [95 %-98 %] 95 % (07/27 0444) Last BM Date: 02/26/19 MRI 7/26: Mildly distended gallbladder with small stones and diffuse wall thickening, highly suspicious for acute cholecystitis.  Mild biliary ductal dilatation, with at least 2 tiny calculi in the distal common bile duct, largest measuring 6 mm. Stable small benign hemangioma in the left hepatic lobe. Small right pleural effusion and mild right basilar atelectasis. Afebrile, VSS Labs below, K+ 3.5 Creatinine dow to 1.09, LFT's improving  WBC 23.9>>19.2   Intake/Output from previous day: 07/26 0701 - 07/27 0700 In: 2700.6 [P.O.:236; I.V.:2315.8; IV Piggyback:148.8] Out: 2100 [Urine:2100] Intake/Output this shift: No intake/output data recorded.  General appearance: alert, cooperative and no distress Resp: clear to auscultation bilaterally GI: Tender in the right upper quadrant.  Lab Results:  Recent Labs    02/27/19 2248 03/01/19 0254  WBC 23.9* 19.2*  HGB 14.7 12.1*  HCT 43.7 36.3*  PLT 213 210    BMET Recent Labs    02/28/19 0554 03/01/19 0254  NA 136 136  K 3.8 3.5  CL 102 102  CO2 24 25  GLUCOSE 97 112*  BUN 14 8  CREATININE 1.16 1.09  CALCIUM 8.5* 8.2*   PT/INR Recent Labs    03/01/19 0254  LABPROT 14.4  INR 1.1    Recent Labs  Lab 02/27/19 2248 02/28/19 0554 03/01/19 0254  AST 73* 43* 34  ALT 105* 79* 60*  ALKPHOS 277* 214* 203*   BILITOT 3.4* 4.0* 3.0*  PROT 7.0 6.3* 5.8*  ALBUMIN 3.4* 2.8* 2.6*     Lipase     Component Value Date/Time   LIPASE 30 02/27/2019 2248     Medications: . buPROPion  300 mg Oral QHS  . Chlorhexidine Gluconate Cloth  6 each Topical Daily  . enoxaparin (LOVENOX) injection  40 mg Subcutaneous Q24H  . mupirocin ointment  1 application Nasal BID  . pantoprazole (PROTONIX) IV  40 mg Intravenous QHS   . dextrose 5 % and 0.45 % NaCl with KCl 20 mEq/L 125 mL/hr at 03/01/19 0830  . piperacillin-tazobactam (ZOSYN)  IV 3.375 g (03/01/19 0552)    Assessment/Plan Acute kidney injury - creatinine 1.42(7/25)>>1.16>>1.09(7/27) Elevated LFT's  Chronic lymphocytic leukemia -stage 0 Hypertension Hx of sigmoid colectomy/?  LAR for precancerous mass 2002 Hx appendectomy/bilateral inguinal hernia repairs  Acute cholecystitis choledocholithisis Leukocytosis WBC 23.9(7/25) >>19.2(7/27)  FEN:  IV fluids/NPO ID:  Zosyn 7/26>> day 2 [id number] Follow up:  Dr. Redmond Pulling POC: Weisel,Victoria Spouse   [phone]   Plan: GI is seeing the patient is going to schedule an ERCP.  Dr. Windle Guard and Dr. Redmond Pulling have reviewed the MRI and they are recommending perc drain placement for now.    We will continue the antibiotics.  This was discussed with the patient and his daughter-in-law who is a pharmacist here in the hospital.  They understand the plan and  they are in agreement.  His daughter-in-law is going to talk to his wife and explained this to her also.     LOS: 0 days    JENNINGS,WILLARD 03/01/2019 [phone]  Agree with above.  ERCP successful today by Dr. Rush Landmark. Perc drain of GB pending. The patient seems to understand the plan and looks good post ERCP.  Alphonsa Overall, MD, [facility] Pager: [phone] Office phone:  [phone]

## 2019-03-01 NOTE — Op Note (Signed)
[facility] Patient Name: Jonathon Parker Procedure Date : 03/01/2019 MRN: [id number] Attending MD: Justice Britain , MD Date of Birth: Jul 24, 1951 CSN: [id number] Age: 68 Admit Type: Inpatient Procedure:                ERCP Indications:              Bile duct stone(s), Common bile duct stone(s),                            Abnormal MRCP Providers:                Justice Britain, MD, Burtis Junes, RN, Marguerita Merles, Technician Referring MD:             Leighton Ruff. Wilson MD, MD Medicines:                See the Anesthesia note for documentation of the                            administered medications, General Anesthesia,                            Indomethacin 323 mg PR Complications:            No immediate complications. Estimated Blood Loss:     Estimated blood loss was minimal. Procedure:                Pre-Anesthesia Assessment:                           - Prior to the procedure, a History and Physical                            was performed, and patient medications and                            allergies were reviewed. The patient's tolerance of                            previous anesthesia was also reviewed. The risks                            and benefits of the procedure and the sedation                            options and risks were discussed with the patient.                            All questions were answered, and informed consent                            was obtained. Prior Anticoagulants: The patient has  taken no previous anticoagulant or antiplatelet                            agents except for NSAID medication. ASA Grade                            Assessment: III - A patient with severe systemic                            disease. After reviewing the risks and benefits,                            the patient was deemed in satisfactory condition to                            undergo the  procedure. Fluoroscopy imaging on                            [place].                           After obtaining informed consent, the scope was                            passed under direct vision. Throughout the                            procedure, the patient's blood pressure, pulse, and                            oxygen saturations were monitored continuously. The                            TJF-Q180V ([id number] Olympus duodenoscope was                            introduced through the mouth, and used to inject                            contrast into and used to inject contrast into the                            bile duct. The ERCP was accomplished without                            difficulty. The patient tolerated the procedure. Scope In: Scope Out: Findings:      The scout film was normal.      The esophagus was successfully intubated under direct vision without       detailed examination of the pharynx, larynx, and associated structures,       and upper GI tract. The major papilla was on the rim of a diverticulum       but otherwise normal.      A short 0.035 inch Soft Jagwire was passed into the biliary tree. The  Autotome sphincterotome was passed over the guidewire and the bile duct       was then deeply cannulated. Contrast was injected. I personally       interpreted the bile duct images. Ductal flow of contrast was adequate.       Image quality was adequate. Contrast extended to the hepatic ducts.       Opacification of the entire biliary tree except for the cystic duct and       gallbladder was successful. The maximum diameter of the ducts was 9 mm.       The lower third of the main duct contained filling defects thought to be       stones. An 8 mm biliary sphincterotomy was made with a monofilament       Autotome sphincterotome using ERBE electrocautery. There was no       post-sphincterotomy bleeding. To discover objects, the biliary tree was       swept with a  retrieval balloon starting at the bifurcation. Two stones       were removed. No stones remained. An occlusion cholangiogram was       performed that showed no further significant biliary pathology.      A pancreatogram was not performed.      The duodenoscope was withdrawn from the patient. Impression:               - The major papilla was on the rim of a                            diverticulum but otherwise normal.                           - Filling defects consistent with stones were seen                            on the cholangiogram.                           - Choledocholithiasis was found. Complete removal                            was accomplished by biliary sphincterotomy and                            balloon sweep. Recommendation:           - The patient will be observed post-procedure,                            until all discharge criteria are met.                           - Return patient to hospital ward for ongoing care.                           - Watch for pancreatitis, bleeding, perforation,                            and cholangitis.                           -  Observe patient's clinical course.                           - Check liver enzymes (AST, ALT, alkaline                            phosphatase, bilirubin) in the morning.                           - Percutaneous Cholecystostomy Tube v                            Cholecystectomy as per primary surgical service                            timing.                           - Advance diet as tolerated. Will maintain NPO                            status in case of possible further procedures today                            until hear back from surgery.                           - The findings and recommendations were discussed                            with the patient.                           - The findings and recommendations were discussed                            with the patient's family.                            - The findings and recommendations were discussed                            with the referring physician. Procedure Code(s):        --- Professional ---                           [phone], Endoscopic retrograde                            cholangiopancreatography (ERCP); with removal of                            calculi/debris from biliary/pancreatic duct(s)                           43262, Endoscopic retrograde  cholangiopancreatography (ERCP); with                            sphincterotomy/papillotomy Diagnosis Code(s):        --- Professional ---                           R93.2, Abnormal findings on diagnostic imaging of                            liver and biliary tract                           K80.50, Calculus of bile duct without cholangitis                            or cholecystitis without obstruction CPT copyright 2019 American [employer] Association. All rights reserved. The codes documented in this report are preliminary and upon coder review may  be revised to meet current compliance requirements. Justice Britain, MD 03/01/2019 11:57:38 AM Number of Addenda: 0

## 2019-03-01 NOTE — Transfer of Care (Signed)
Immediate Anesthesia Transfer of Care Note  Patient: Jonathon Parker  Procedure(s) Performed: ENDOSCOPIC RETROGRADE CHOLANGIOPANCREATOGRAPHY (ERCP) WITH PROPOFOL (N/A ) SPHINCTEROTOMY REMOVAL OF STONES  Patient Location: Endoscopy Unit  Anesthesia Type:General  Level of Consciousness: awake, alert  and patient cooperative  Airway & Oxygen Therapy: Patient Spontanous Breathing  Post-op Assessment: Report given to RN and Post -op Vital signs reviewed and stable  Post vital signs: Reviewed and stable  Last Vitals:  Vitals Value Taken Time  BP 195/93 03/01/19 1158  Temp    Pulse 81 03/01/19 1158  Resp 17 03/01/19 1158  SpO2 93 % 03/01/19 1158    Last Pain:  Vitals:   03/01/19 1158  TempSrc: Oral  PainSc:          Complications: No apparent anesthesia complications

## 2019-03-02 ENCOUNTER — Telehealth: Payer: Self-pay

## 2019-03-02 ENCOUNTER — Encounter (HOSPITAL_COMMUNITY): Payer: Self-pay | Admitting: General Practice

## 2019-03-02 ENCOUNTER — Inpatient Hospital Stay (HOSPITAL_COMMUNITY): Payer: Medicare Other

## 2019-03-02 DIAGNOSIS — Z9889 Other specified postprocedural states: Secondary | ICD-10-CM

## 2019-03-02 DIAGNOSIS — K8050 Calculus of bile duct without cholangitis or cholecystitis without obstruction: Secondary | ICD-10-CM

## 2019-03-02 DIAGNOSIS — K8001 Calculus of gallbladder with acute cholecystitis with obstruction: Secondary | ICD-10-CM

## 2019-03-02 DIAGNOSIS — R945 Abnormal results of liver function studies: Secondary | ICD-10-CM

## 2019-03-02 HISTORY — PX: IR PERC CHOLECYSTOSTOMY: IMG2326

## 2019-03-02 LAB — CBC WITH DIFFERENTIAL/PLATELET
Abs Immature Granulocytes: 0.2 10*3/uL — ABNORMAL HIGH (ref 0.00–0.07)
Basophils Absolute: 0 10*3/uL (ref 0.0–0.1)
Basophils Relative: 0 %
Eosinophils Absolute: 0.3 10*3/uL (ref 0.0–0.5)
Eosinophils Relative: 2 %
HCT: 35.7 % — ABNORMAL LOW (ref 39.0–52.0)
Hemoglobin: 11.8 g/dL — ABNORMAL LOW (ref 13.0–17.0)
Lymphocytes Relative: 53 %
Lymphs Abs: 8.7 10*3/uL — ABNORMAL HIGH (ref 0.7–4.0)
MCH: 32.5 pg (ref 26.0–34.0)
MCHC: 33.1 g/dL (ref 30.0–36.0)
MCV: 98.3 fL (ref 80.0–100.0)
Metamyelocytes Relative: 1 %
Monocytes Absolute: 0 10*3/uL — ABNORMAL LOW (ref 0.1–1.0)
Monocytes Relative: 0 %
Neutro Abs: 7.2 10*3/uL (ref 1.7–7.7)
Neutrophils Relative %: 44 %
Platelets: 243 10*3/uL (ref 150–400)
RBC: 3.63 MIL/uL — ABNORMAL LOW (ref 4.22–5.81)
RDW: 12.1 % (ref 11.5–15.5)
WBC: 16.4 10*3/uL — ABNORMAL HIGH (ref 4.0–10.5)
nRBC: 0 % (ref 0.0–0.2)
nRBC: 0 /100 WBC

## 2019-03-02 LAB — COMPREHENSIVE METABOLIC PANEL
ALT: 42 U/L (ref 0–44)
AST: 21 U/L (ref 15–41)
Albumin: 2.5 g/dL — ABNORMAL LOW (ref 3.5–5.0)
Alkaline Phosphatase: 171 U/L — ABNORMAL HIGH (ref 38–126)
Anion gap: 5 (ref 5–15)
BUN: 8 mg/dL (ref 8–23)
CO2: 26 mmol/L (ref 22–32)
Calcium: 8.3 mg/dL — ABNORMAL LOW (ref 8.9–10.3)
Chloride: 109 mmol/L (ref 98–111)
Creatinine, Ser: 1.11 mg/dL (ref 0.61–1.24)
GFR calc Af Amer: >60 mL/min (ref >60)
GFR calc non Af Amer: >60 mL/min (ref >60)
Glucose, Bld: 104 mg/dL — ABNORMAL HIGH (ref 70–99)
Potassium: 4.4 mmol/L (ref 3.5–5.1)
Sodium: 140 mmol/L (ref 135–145)
Total Bilirubin: 1.3 mg/dL — ABNORMAL HIGH (ref 0.3–1.2)
Total Protein: 5.5 g/dL — ABNORMAL LOW (ref 6.5–8.1)

## 2019-03-02 MED ORDER — FENTANYL CITRATE (PF) 100 MCG/2ML IJ SOLN
INTRAMUSCULAR | Status: AC
  Filled 2019-03-02: qty 2

## 2019-03-02 MED ORDER — LIDOCAINE HCL (PF) 1 % IJ SOLN
INTRAMUSCULAR | Status: AC | PRN
  Administered 2019-03-02: 5 mL

## 2019-03-02 MED ORDER — BOOST / RESOURCE BREEZE PO LIQD CUSTOM
1.0000 | Freq: Three times a day (TID) | ORAL | Status: DC
  Administered 2019-03-02 – 2019-03-03 (×2): 1 via ORAL

## 2019-03-02 MED ORDER — MIDAZOLAM HCL 2 MG/2ML IJ SOLN
INTRAMUSCULAR | Status: AC | PRN
  Administered 2019-03-02: 1 mg via INTRAVENOUS

## 2019-03-02 MED ORDER — FENTANYL CITRATE (PF) 100 MCG/2ML IJ SOLN
INTRAMUSCULAR | Status: AC | PRN
  Administered 2019-03-02: 50 ug via INTRAVENOUS

## 2019-03-02 MED ORDER — ADULT MULTIVITAMIN W/MINERALS CH
1.0000 | ORAL_TABLET | Freq: Every day | ORAL | Status: DC
  Administered 2019-03-02 – 2019-03-07 (×6): 1 via ORAL
  Filled 2019-03-02 (×6): qty 1

## 2019-03-02 MED ORDER — LIDOCAINE HCL 1 % IJ SOLN
INTRAMUSCULAR | Status: AC
  Filled 2019-03-02: qty 20

## 2019-03-02 MED ORDER — IOHEXOL 300 MG/ML  SOLN: 10.0000 mL | Freq: Once | INTRAMUSCULAR | Status: DC | PRN

## 2019-03-02 MED ORDER — MIDAZOLAM HCL 2 MG/2ML IJ SOLN
INTRAMUSCULAR | Status: AC
  Filled 2019-03-02: qty 2

## 2019-03-02 NOTE — Telephone Encounter (Signed)
-----   Message from [name], MD sent at 03/01/2019  9:39 PM EDT ----- Chong Sicilian, Please schedule a follow-up in clinic in approximately 4 to 6 weeks. Please also schedule a hepatic function panel in approximately 1 to 2 weeks. Thank you. GM

## 2019-03-02 NOTE — Progress Notes (Signed)
Daily Rounding Note  03/02/2019, 9:14 AM  LOS: 1 day   SUBJECTIVE:   Chief complaint:     Still having pain in RUQ.  No nausea.  Tolerating clears  OBJECTIVE:         Vital signs in last 24 hours:    Temp:  [97.8 F (36.6 C)-99.2 F (37.3 C)] 98.6 F (37 C) (07/28 0509) Pulse Rate:  [55-82] 56 (07/28 0509) Resp:  [17-23] 18 (07/28 0509) BP: (135-195)/(78-93) 135/78 (07/28 0509) SpO2:  [93 %-99 %] 95 % (07/28 0509) Last BM Date: 02/26/19 Filed Weights   02/27/19 2156 02/28/19 0758  Weight: 83.9 kg 87.7 kg   General: Looks well. Heart: RRR. Chest: Clear bilaterally. Abdomen: Soft.  Tender in the right upper quadrant without guarding or rebound.  Active bowel sounds.  Nondistended. Extremities: No CCE. Neuro/Psych: Alert/oriented x3.  Intake/Output from previous day: 07/27 0701 - 07/28 0700 In: 1800 [I.V.:1600; IV Piggyback:200] Out: 900 [Urine:900]  Intake/Output this shift: Total I/O In: -  Out: 225 [Urine:225]  Lab Results: Recent Labs    02/27/19 2248 03/01/19 0254  WBC 23.9* 19.2*  HGB 14.7 12.1*  HCT 43.7 36.3*  PLT 213 210   BMET Recent Labs    02/27/19 2248 02/28/19 0554 03/01/19 0254  NA 137 136 136  K 4.1 3.8 3.5  CL 100 102 102  CO2 28 24 25   GLUCOSE 108* 97 112*  BUN 14 14 8   CREATININE 1.42* 1.16 1.09  CALCIUM 9.4 8.5* 8.2*   LFT Recent Labs    02/27/19 2248 02/28/19 0554 03/01/19 0254  PROT 7.0 6.3* 5.8*  ALBUMIN 3.4* 2.8* 2.6*  AST 73* 43* 34  ALT 105* 79* 60*  ALKPHOS 277* 214* 203*  BILITOT 3.4* 4.0* 3.0*   PT/INR Recent Labs    03/01/19 0254  LABPROT 14.4  INR 1.1   Hepatitis Panel No results for input(s): HEPBSAG, HCVAB, HEPAIGM, HEPBIGM in the last 72 hours.  Studies/Results: Mr 3d Recon At Scanner  Result Date: 02/28/2019 CLINICAL DATA:  Elevated liver tests. Right upper quadrant pain. Cholelithiasis. EXAM: MRI ABDOMEN WITHOUT AND WITH CONTRAST  (INCLUDING MRCP) TECHNIQUE: Multiplanar multisequence MR imaging of the abdomen was performed both before and after the administration of intravenous contrast. Heavily T2-weighted images of the biliary and pancreatic ducts were obtained, and three-dimensional MRCP images were rendered by post processing. CONTRAST:  8 mL Gadavist COMPARISON:  02/13/2017 FINDINGS: Lower chest: Small right pleural effusion and mild right basilar atelectasis noted. Hepatobiliary: A 1.5 cm benign hemangioma is seen in segment 4 B, adjacent to the gallbladder fossa. This is stable since previous study. No other liver masses are identified. Small gallstones are seen. Gallbladder is mildly dilated and shows diffuse gallbladder wall thickening, highly suspicious for acute cholecystitis. Mild diffuse biliary ductal dilatation seen with common bile duct measuring 8 mm in diameter. At least 2 calculi are seen in the distal common bile duct, largest measuring 6 mm. Pancreas: No mass or inflammatory changes. No evidence of pancreatic ductal dilatation or pancreas divisum. Spleen:  Within normal limits in size and appearance. Adrenals/Urinary Tract: No masses identified. No evidence of hydronephrosis. Stomach/Bowel: Visualized portion unremarkable. Vascular/Lymphatic: No pathologically enlarged lymph nodes identified. No abdominal aortic aneurysm. Other:  None. Musculoskeletal:  No suspicious bone lesions identified. IMPRESSION: Mildly distended gallbladder with small stones and diffuse wall thickening, highly suspicious for acute cholecystitis. Mild biliary ductal dilatation, with at least 2 tiny calculi in  the distal common bile duct, largest measuring 6 mm. Stable small benign hemangioma in the left hepatic lobe. Small right pleural effusion and mild right basilar atelectasis. Electronically Signed   By: Marlaine Hind M.D.   On: 02/28/2019 15:21   Dg Ercp Biliary & Pancreatic Ducts  Result Date: 03/01/2019 CLINICAL DATA:  68 year old male  with a history of transaminitis EXAM: ERCP TECHNIQUE: Multiple spot images obtained with the fluoroscopic device and submitted for interpretation post-procedure. FLUOROSCOPY TIME:  Fluoroscopy Time:  34 seconds COMPARISON:  MR February 28, 2019 FINDINGS: Limited fluoroscopic spot images and sin a images from ERCP. Initial image demonstrates endoscope projecting over the upper abdomen. There is linear radiopaque structure projecting over the liver of uncertain significance, possibly overlying the patient. Subsequently there is cannulation of the ampulla and retrograde infusion of contrast partially opacifying the biliary ducts. There is deployment of a retrieval balloon. IMPRESSION: Limited images during ERCP demonstrates partial opacification of the extrahepatic biliary ducts and deployment of a retrieval balloon. Please refer to the dictated operative report for full details of intraoperative findings and procedure. Electronically Signed   By: Corrie Mckusick D.O.   On: 03/01/2019 12:50   Mr Abdomen Mrcp Moise Boring Contast  Result Date: 02/28/2019 CLINICAL DATA:  Elevated liver tests. Right upper quadrant pain. Cholelithiasis. EXAM: MRI ABDOMEN WITHOUT AND WITH CONTRAST (INCLUDING MRCP) TECHNIQUE: Multiplanar multisequence MR imaging of the abdomen was performed both before and after the administration of intravenous contrast. Heavily T2-weighted images of the biliary and pancreatic ducts were obtained, and three-dimensional MRCP images were rendered by post processing. CONTRAST:  8 mL Gadavist COMPARISON:  02/13/2017 FINDINGS: Lower chest: Small right pleural effusion and mild right basilar atelectasis noted. Hepatobiliary: A 1.5 cm benign hemangioma is seen in segment 4 B, adjacent to the gallbladder fossa. This is stable since previous study. No other liver masses are identified. Small gallstones are seen. Gallbladder is mildly dilated and shows diffuse gallbladder wall thickening, highly suspicious for acute  cholecystitis. Mild diffuse biliary ductal dilatation seen with common bile duct measuring 8 mm in diameter. At least 2 calculi are seen in the distal common bile duct, largest measuring 6 mm. Pancreas: No mass or inflammatory changes. No evidence of pancreatic ductal dilatation or pancreas divisum. Spleen:  Within normal limits in size and appearance. Adrenals/Urinary Tract: No masses identified. No evidence of hydronephrosis. Stomach/Bowel: Visualized portion unremarkable. Vascular/Lymphatic: No pathologically enlarged lymph nodes identified. No abdominal aortic aneurysm. Other:  None. Musculoskeletal:  No suspicious bone lesions identified. IMPRESSION: Mildly distended gallbladder with small stones and diffuse wall thickening, highly suspicious for acute cholecystitis. Mild biliary ductal dilatation, with at least 2 tiny calculi in the distal common bile duct, largest measuring 6 mm. Stable small benign hemangioma in the left hepatic lobe. Small right pleural effusion and mild right basilar atelectasis. Electronically Signed   By: Marlaine Hind M.D.   On: 02/28/2019 15:21    ASSESMENT:   *   Choledocholithiasis. 03/01/2019 ERCP with sphincterotomy and stone extraction. LFTs improved  *     Cholecystitis.  Neurosurgery planning percutaneous drain placement today.    *   Liver hemangioma  *   Hx adenomatous colon polyps, large polyp surgically resected age 89.  Latest surveillance ~ 2015.     PLAN   *   GI signing off, available PRN.   fup with Dr Lyndel Safe prn and for future polyp surveillance colonoscopy.      Azucena Freed  03/02/2019, 9:14 AM Phone  [phone] °

## 2019-03-02 NOTE — Anesthesia Postprocedure Evaluation (Signed)
Anesthesia Post Note  Patient: Jonathon Parker  Procedure(s) Performed: ENDOSCOPIC RETROGRADE CHOLANGIOPANCREATOGRAPHY (ERCP) WITH PROPOFOL (N/A ) SPHINCTEROTOMY REMOVAL OF STONES     Patient location during evaluation: Endoscopy Anesthesia Type: General Level of consciousness: awake and alert Pain management: pain level controlled Vital Signs Assessment: post-procedure vital signs reviewed and stable Respiratory status: spontaneous breathing, nonlabored ventilation, respiratory function stable and patient connected to nasal cannula oxygen Cardiovascular status: blood pressure returned to baseline and stable Postop Assessment: no apparent nausea or vomiting Anesthetic complications: no    Last Vitals:  Vitals:   03/01/19 2123 03/02/19 0509  BP: (!) 159/84 135/78  Pulse: (!) 55 (!) 56  Resp: 18 18  Temp: 37 C 37 C  SpO2: 97% 95%    Last Pain:  Vitals:   03/02/19 0509  TempSrc: Oral  PainSc:                  Micro S

## 2019-03-02 NOTE — Procedures (Signed)
Interventional Radiology Procedure:   Indications: Acute cholecystitis  Procedure: Image guided cholecystostomy tube placement  Findings: Gallstones, removed 25 ml of brown thick fluid  Complications: None     EBL: less than 10 ml  Plan: Send fluid for culture, follow drainage.   Beverely Suen R. Anselm Pancoast, MD  Pager: [phone]

## 2019-03-02 NOTE — Progress Notes (Signed)
Back from IR s/p Image guided cholecystostomy tube placement.Pt.alert and oriented.Tube in situ

## 2019-03-02 NOTE — Progress Notes (Addendum)
1 Day Post-Op    CC: Abdominal cramping  Subjective: RUQ pain slightly better today.  He does not complain much of pain normally.  He has not eaten since last week.  Objective: Vital signs in last 24 hours: Temp:  [97.8 F (36.6 C)-99.2 F (37.3 C)] 98.6 F (37 C) (07/28 0509) Pulse Rate:  [55-82] 56 (07/28 0509) Resp:  [17-23] 18 (07/28 0509) BP: (135-195)/(78-93) 135/78 (07/28 0509) SpO2:  [93 %-99 %] 95 % (07/28 0509) Last BM Date: 02/26/19 MRI 7/26: Mildly distended gallbladder with small stones and diffuse wall thickening, highly suspicious for acute cholecystitis.  Mild biliary ductal dilatation, with at least 2 tiny calculi in the distal common bile duct, largest measuring 6 mm. Stable small benign hemangioma in the left hepatic lobe. Small right pleural effusion and mild right basilar atelectasis. Afebrile, VSS Labs below, K+ 3.5 Creatinine dow to 1.09, LFT's improving  WBC 23.9>>19.2   Intake/Output from previous day: 07/27 0701 - 07/28 0700 In: 1800 [I.V.:1600; IV Piggyback:200] Out: 900 [Urine:900] Intake/Output this shift: Total I/O In: -  Out: 225 [Urine:225]  General appearance: alert, cooperative and no distress Resp: clear to auscultation bilaterally GI: Mildly tender in the right upper quadrant. No rebound/guarding  Lab Results:  Recent Labs    02/27/19 2248 03/01/19 0254  WBC 23.9* 19.2*  HGB 14.7 12.1*  HCT 43.7 36.3*  PLT 213 210    BMET Recent Labs    02/28/19 0554 03/01/19 0254  NA 136 136  K 3.8 3.5  CL 102 102  CO2 24 25  GLUCOSE 97 112*  BUN 14 8  CREATININE 1.16 1.09  CALCIUM 8.5* 8.2*   PT/INR Recent Labs    03/01/19 0254  LABPROT 14.4  INR 1.1    Recent Labs  Lab 02/27/19 2248 02/28/19 0554 03/01/19 0254  AST 73* 43* 34  ALT 105* 79* 60*  ALKPHOS 277* 214* 203*  BILITOT 3.4* 4.0* 3.0*  PROT 7.0 6.3* 5.8*  ALBUMIN 3.4* 2.8* 2.6*     Lipase     Component Value Date/Time   LIPASE 30 02/27/2019 2248      Medications: . buPROPion  300 mg Oral QHS  . Chlorhexidine Gluconate Cloth  6 each Topical Daily  . enoxaparin (LOVENOX) injection  40 mg Subcutaneous Q24H  . indomethacin  100 mg Rectal Once  . mupirocin ointment  1 application Nasal BID  . pantoprazole (PROTONIX) IV  40 mg Intravenous QHS   . dextrose 5 % and 0.9 % NaCl with KCl 40 mEq/L 100 mL/hr at 03/02/19 0505  . piperacillin-tazobactam (ZOSYN)  IV 3.375 g (03/02/19 0507)    Assessment/Plan Acute kidney injury - creatinine 1.42(7/25)>>1.16>>1.09(7/27) Elevated LFT's  Chronic lymphocytic leukemia -stage 0 Hypertension Hx of sigmoid colectomy/?  LAR for precancerous mass 2002 Hx appendectomy/bilateral inguinal hernia repairs  Acute cholecystitis choledocholithisis Leukocytosis WBC 23.9(7/25) >>19.2(7/27)  FEN:  IV fluids/NPO ID:  Zosyn 7/26>> day 3 [id number] Follow up:  Dr. Redmond Pulling POC: Dumas,Victoria Spouse   [phone]   Plan: s/p ERCP; cleared duct.  Dr. Windle Guard and Dr. Redmond Pulling have reviewed the MRI and they are recommending perc drain placement for now - planning this later today -Continue antibiotics -Trend LFTs   LOS: 1 day   Sharon Mt. Dema Severin, M.D. [facility]

## 2019-03-02 NOTE — Telephone Encounter (Signed)
Agreed, this is [name] patient. Please set up a hepatic function panel in 1 to 2 weeks. Follow-up with Dr. Lyndel Safe in approximately 4 to 5 weeks. Jonathon Parker about a former patient of yours. Thank you. GM

## 2019-03-02 NOTE — Progress Notes (Signed)
Initial Nutrition Assessment  RD working remotely.  DOCUMENTATION CODES:   Not applicable  INTERVENTION:   -Boost Breeze po TID, each supplement provides 250 kcal and 9 grams of protein -MVI with minerals daily -RD will follow for diet advancement and adjust supplement regimen as appropriate  NUTRITION DIAGNOSIS:   Inadequate oral intake related to altered GI function as evidenced by per patient/family report.  GOAL:   Patient will meet greater than or equal to 90% of their needs  MONITOR:   PO intake, Supplement acceptance, Diet advancement, Labs, Weight trends, Skin, I & O's  REASON FOR ASSESSMENT:   Malnutrition Screening Tool, Consult Assessment of nutrition requirement/status  ASSESSMENT:   68 year old male with history of hypertension, CLL stage 0 presents to the emergency room for ongoing nausea and poor appetite.  He also has had right upper quadrant pain.  He states his symptoms developed last Sunday after eating a tomato sandwich that tasted funny.  He immediately had multiple episodes of nausea and vomiting.  He also had discomfort from all the vomiting.  He states that he would have waves of pain in the right upper abdomen.  He describes it as a stabbing sensation that would last 10 minutes or so but would repeat quite frequently.  He had some back pain but he attributed that to all the vomiting.  He continued to have episodes of nausea throughout the week.  He only vomited again on Monday.  He developed some fever but he cannot remember the day that he broke out in a fever.  He had poor appetite and is only had 3 peanut butter crackers in the past 3 days.  His wife has been encouraged him to drink liquids.  He finally came to the emergency room when he lost his desire to drink liquids.  No diarrhea.  Last bowel movement was day before yesterday.  Pt admitted with acute calculus cholecystitis.   7/27- s/p ERCP and biliary sphincterotomy and balloon sweep, stones  found 7/28- s/p cholecystotomy tube placement (revealed gallstones and 25 ml brown thick fluid removed)  Reviewed I/O's: +900 ml x 24 hours and +1.5 L since admission  UOP: 900 ml x 24 hours  Attempted to speak with pt via phone, however, no answer.   Per chart review, pt with decreased oral intake over the past 10 days secondary to nausea, vomiting, and abdominal pain. Per MD notes, pt has consumed only 3 peanut butter crackers over the past 3 days PTA. He was also intolerant to liquids, which is what brought him to this hospital.   Pt was just advanced to a clear liquid diet, however, no meal completion data available to assess at this time.   Reviewed wt hx; no recent wt hx to assess, however, wt does appear stable over the past 2 years.   IVFS: dextrose 5% and 0.9% NaCl with KCl 40 mEq/L @ 100 ml/hr  Albumin has a half-life of 21 days and is strongly affected by stress response and inflammatory process, therefore, do not expect to see an improvement in this lab value during acute hospitalization. When a patient presents with low albumin, it is likely skewed due to the acute inflammatory response.  Unless it is suspected that patient had poor PO intake or malnutrition prior to admission, then RD should not be consulted solely for low albumin. Note that low albumin is no longer used to diagnose malnutrition; [facility] uses the new malnutrition guidelines published by the American Society for Parenteral  and Enteral Nutrition (A.S.P.E.N.) and the Academy of Nutrition and Dietetics (AND).    Labs reviewed.   NUTRITION - FOCUSED PHYSICAL EXAM:    Most Recent Value  Orbital Region  Unable to assess  Upper Arm Region  Unable to assess  Thoracic and Lumbar Region  Unable to assess  Buccal Region  Unable to assess  Temple Region  Unable to assess  Clavicle Bone Region  Unable to assess  Clavicle and Acromion Bone Region  Unable to assess  Scapular Bone Region  Unable to assess  Dorsal  Hand  Unable to assess  Patellar Region  Unable to assess  Anterior Thigh Region  Unable to assess  Posterior Calf Region  Unable to assess  Edema (RD Assessment)  Unable to assess  Hair  Unable to assess  Eyes  Unable to assess  Mouth  Unable to assess  Skin  Unable to assess  Nails  Unable to assess       Diet Order:   Diet Order            Diet clear liquid Room service appropriate? Yes; Fluid consistency: Thin  Diet effective now              EDUCATION NEEDS:   No education needs have been identified at this time  Skin:  Skin Assessment: Reviewed RN Assessment  Last BM:  03/01/19  Height:   Ht Readings from Last 1 Encounters:  02/28/19 6' (1.829 m)    Weight:   Wt Readings from Last 1 Encounters:  02/28/19 87.7 kg    Ideal Body Weight:  80.9 kg  BMI:  Body mass index is 26.22 kg/m.  Estimated Nutritional Needs:   Kcal:  2200-2400  Protein:  105-120 grams  Fluid:  > 2.2 L    Kathlene Yano A. Jimmye Norman, RD, LDN, [place] Registered Dietitian II Certified Diabetes Care and Education Specialist Pager: [phone] After hours Pager: [phone]

## 2019-03-02 NOTE — Telephone Encounter (Signed)
This is a Dr Lyndel Safe pt Bri.  Looks like he is still admitted.  Gerre Couch may be sending you something as well.

## 2019-03-03 ENCOUNTER — Other Ambulatory Visit: Payer: Self-pay

## 2019-03-03 DIAGNOSIS — R7989 Other specified abnormal findings of blood chemistry: Secondary | ICD-10-CM

## 2019-03-03 DIAGNOSIS — K529 Noninfective gastroenteritis and colitis, unspecified: Secondary | ICD-10-CM

## 2019-03-03 DIAGNOSIS — K219 Gastro-esophageal reflux disease without esophagitis: Secondary | ICD-10-CM

## 2019-03-03 LAB — CBC
HCT: 37.2 % — ABNORMAL LOW (ref 39.0–52.0)
Hemoglobin: 12.2 g/dL — ABNORMAL LOW (ref 13.0–17.0)
MCH: 32.5 pg (ref 26.0–34.0)
MCHC: 32.8 g/dL (ref 30.0–36.0)
MCV: 99.2 fL (ref 80.0–100.0)
Platelets: 294 10*3/uL (ref 150–400)
RBC: 3.75 MIL/uL — ABNORMAL LOW (ref 4.22–5.81)
RDW: 12.1 % (ref 11.5–15.5)
WBC: 20.1 10*3/uL — ABNORMAL HIGH (ref 4.0–10.5)
nRBC: 0 % (ref 0.0–0.2)

## 2019-03-03 LAB — COMPREHENSIVE METABOLIC PANEL
ALT: 38 U/L (ref 0–44)
AST: 21 U/L (ref 15–41)
Albumin: 2.6 g/dL — ABNORMAL LOW (ref 3.5–5.0)
Alkaline Phosphatase: 197 U/L — ABNORMAL HIGH (ref 38–126)
Anion gap: 8 (ref 5–15)
BUN: 6 mg/dL — ABNORMAL LOW (ref 8–23)
CO2: 21 mmol/L — ABNORMAL LOW (ref 22–32)
Calcium: 8.5 mg/dL — ABNORMAL LOW (ref 8.9–10.3)
Chloride: 107 mmol/L (ref 98–111)
Creatinine, Ser: 1.08 mg/dL (ref 0.61–1.24)
GFR calc Af Amer: >60 mL/min (ref >60)
GFR calc non Af Amer: >60 mL/min (ref >60)
Glucose, Bld: 102 mg/dL — ABNORMAL HIGH (ref 70–99)
Potassium: 4.2 mmol/L (ref 3.5–5.1)
Sodium: 136 mmol/L (ref 135–145)
Total Bilirubin: 1.8 mg/dL — ABNORMAL HIGH (ref 0.3–1.2)
Total Protein: 6.1 g/dL — ABNORMAL LOW (ref 6.5–8.1)

## 2019-03-03 MED ORDER — ENSURE ENLIVE PO LIQD: 237.0000 mL | Freq: Two times a day (BID) | ORAL | Status: DC

## 2019-03-03 MED ORDER — ONDANSETRON 4 MG PO TBDP: 4.0000 mg | ORAL_TABLET | Freq: Four times a day (QID) | ORAL | Status: DC | PRN

## 2019-03-03 MED ORDER — PANTOPRAZOLE SODIUM 40 MG PO TBEC
40.0000 mg | DELAYED_RELEASE_TABLET | Freq: Every day | ORAL | Status: DC
  Administered 2019-03-03 – 2019-03-06 (×4): 40 mg via ORAL
  Filled 2019-03-03 (×4): qty 1

## 2019-03-03 NOTE — Telephone Encounter (Signed)
Follow up appt recall placed as schedule for that time is not available; lab order entered into Epic; will need to notify patient of repeat lab work;

## 2019-03-03 NOTE — Progress Notes (Addendum)
Nutrition Follow-up  DOCUMENTATION CODES:   Not applicable  INTERVENTION:   -D/c Boost Breeze po TID, each supplement provides 250 kcal and 9 grams of protein -Ensure Enlive po BID, each supplement provides 350 kcal and 20 grams of protein -Continue MVI with minerals daily -Magic cup TID with meals, each supplement provides 290 kcal and 9 grams of protein  NUTRITION DIAGNOSIS:   Inadequate oral intake related to altered GI function as evidenced by per patient/family report.  Progressing  GOAL:   Patient will meet greater than or equal to 90% of their needs  Progressing   MONITOR:   PO intake, Supplement acceptance, Diet advancement, Labs, Weight trends, Skin, I & O's  REASON FOR ASSESSMENT:   Malnutrition Screening Tool, Consult Assessment of nutrition requirement/status  ASSESSMENT:   68 year old male with history of hypertension, CLL stage 0 presents to the emergency room for ongoing nausea and poor appetite.  He also has had right upper quadrant pain.  He states his symptoms developed last Sunday after eating a tomato sandwich that tasted funny.  He immediately had multiple episodes of nausea and vomiting.  He also had discomfort from all the vomiting.  He states that he would have waves of pain in the right upper abdomen.  He describes it as a stabbing sensation that would last 10 minutes or so but would repeat quite frequently.  He had some back pain but he attributed that to all the vomiting.  He continued to have episodes of nausea throughout the week.  He only vomited again on Monday.  He developed some fever but he cannot remember the day that he broke out in a fever.  He had poor appetite and is only had 3 peanut butter crackers in the past 3 days.  His wife has been encouraged him to drink liquids.  He finally came to the emergency room when he lost his desire to drink liquids.  No diarrhea.  Last bowel movement was day before yesterday.  7/27- s/p ERCP and biliary  sphincterotomy and balloon sweep, stones found 7/28- s/p cholecystotomy tube placement (revealed gallstones and 25 ml brown thick fluid removed) 7/29- advanced to full liquid diet  Reviewed I/O's: +69 ml x 24 hours and +1.6 L x 24 hours   UOP: 2.1 L x 24 hours  Drain output: 70 ml x 24 hours  Pt tolerating clear liquid diet, however, does not like options offered on diet. Noted meal completion 0-60%. Pt was advanced to full liquids for lunch. Noted pt accepted Boost Breeze supplement. Due to diet advancement and for increased nutrient density, will order Ensure Enlive supplement.   Labs reviewed.   Diet Order:   Diet Order            Diet full liquid Room service appropriate? Yes; Fluid consistency: Thin  Diet effective now              EDUCATION NEEDS:   No education needs have been identified at this time  Skin:  Skin Assessment: Reviewed RN Assessment  Last BM:  03/01/19  Height:   Ht Readings from Last 1 Encounters:  02/28/19 6' (1.829 m)    Weight:   Wt Readings from Last 1 Encounters:  02/28/19 87.7 kg    Ideal Body Weight:  80.9 kg  BMI:  Body mass index is 26.22 kg/m.  Estimated Nutritional Needs:   Kcal:  2200-2400  Protein:  105-120 grams  Fluid:  > 2.2 L    Adellyn Capek A.  Jimmye Norman, RD, LDN, [place] Registered Dietitian II Certified Diabetes Care and Education Specialist Pager: [phone] After hours Pager: [phone]

## 2019-03-03 NOTE — Progress Notes (Addendum)
2 Days Post-Op  Subjective: CC: Drain site pain Patient reports pain in his RUQ where he had his IR drain placed yesterday. Worse with coughing. No sob. Did not get out of bed yesterday. Tolerating CLD but does not like the options. No N/V. Passing flatus. Last BM 7/27.   Objective: Vital signs in last 24 hours: Temp:  [98.4 F (36.9 C)-100.4 F (38 C)] 99.7 F (37.6 C) (07/29 0508) Pulse Rate:  [53-74] 74 (07/29 0343) Resp:  [12-23] 18 (07/29 0343) BP: (130-183)/(77-118) 130/77 (07/29 0343) SpO2:  [93 %-99 %] 93 % (07/29 0343) Last BM Date: 03/01/19  Intake/Output from previous day: 07/28 0701 - 07/29 0700 In: 2263.6 [I.V.:2140.6; IV Piggyback:122.9] Out: 2195 [Urine:2125; Drains:70] Intake/Output this shift: No intake/output data recorded.  PE: Gen: Awake and alert, NAD Lungs: Normal rate and effort Abd: Soft, ND, mild tenderness in the RUQ without peritonitis. +BS. IR drain in place with gravity bag. Blood tinged bile in bag and minimal. 70cc recorded since placement  Lab Results:  Recent Labs    03/02/19 0845 03/03/19 0334  WBC 16.4* 20.1*  HGB 11.8* 12.2*  HCT 35.7* 37.2*  PLT 243 294   BMET Recent Labs    03/02/19 0845 03/03/19 0334  NA 140 136  K 4.4 4.2  CL 109 107  CO2 26 21*  GLUCOSE 104* 102*  BUN 8 6*  CREATININE 1.11 1.08  CALCIUM 8.3* 8.5*   PT/INR Recent Labs    03/01/19 0254  LABPROT 14.4  INR 1.1   CMP     Component Value Date/Time   NA 136 03/03/2019 0334   K 4.2 03/03/2019 0334   CL 107 03/03/2019 0334   CO2 21 (L) 03/03/2019 0334   GLUCOSE 102 (H) 03/03/2019 0334   BUN 6 (L) 03/03/2019 0334   CREATININE 1.08 03/03/2019 0334   CALCIUM 8.5 (L) 03/03/2019 0334   PROT 6.1 (L) 03/03/2019 0334   ALBUMIN 2.6 (L) 03/03/2019 0334   AST 21 03/03/2019 0334   ALT 38 03/03/2019 0334   ALKPHOS 197 (H) 03/03/2019 0334   BILITOT 1.8 (H) 03/03/2019 0334   GFRNONAA >60 03/03/2019 0334   GFRAA >60 03/03/2019 0334   Lipase       Component Value Date/Time   LIPASE 30 02/27/2019 2248       Studies/Results: Ir Perc Cholecystostomy  Result Date: 03/02/2019 INDICATION: 68 year old with acute cholecystitis and history of choledocholithiasis. Plan for image guided cholecystostomy tube placement. EXAM: IMAGE GUIDED CHOLECYSTOSTOMY TUBE PLACEMENT MEDICATIONS: Moderate sedation. No additional antibiotics were given for this procedure. ANESTHESIA/SEDATION: Moderate (conscious) sedation was employed during this procedure. A total of Versed 1.0 mg and Fentanyl 50 mcg was administered intravenously. Moderate Sedation Time: 10 minutes. The patient's level of consciousness and vital signs were monitored continuously by radiology nursing throughout the procedure under my direct supervision. FLUOROSCOPY TIME:  Fluoroscopy Time: 24 seconds, 1 mGy COMPLICATIONS: None immediate. PROCEDURE: Informed written consent was obtained from the patient after a thorough discussion of the procedural risks, benefits and alternatives. All questions were addressed. Maximal Sterile Barrier Technique was utilized including caps, mask, sterile gowns, sterile gloves, sterile drape, hand hygiene and skin antiseptic. A timeout was performed prior to the initiation of the procedure. Right side of the abdomen was prepped and draped in sterile fashion. Ultrasound was used to identify the gallbladder. Percutaneous window was identified for the cholecystostomy tube placement. Skin and soft tissues were anesthetized with 1% lidocaine. 21 gauge needle directed into the  gallbladder fundus with ultrasound guidance. 0.018 wire was advanced in the gallbladder. A transitional dilator set was placed. Tract was dilated over a stiff Amplatz wire and a 10.2 [place] multipurpose drain was placed. Approximately 25 mL of thick brown fluid was removed from the gallbladder. The gallbladder was decompressed at the end of the procedure based on ultrasound. Fluid was sent for culture. Catheter  was sutured to skin and attached to a gravity bag. Fluoroscopic and ultrasound images were taken and saved for documentation. FINDINGS: Irregular gallbladder wall thickening with a large amount of sludge and/or stones in the gallbladder. 25 mL of thick brown fluid was removed from the gallbladder lumen. Gallbladder was decompressed at the end of the procedure. IMPRESSION: Successful percutaneous cholecystostomy tube placement with ultrasound and fluoroscopic guidance. Electronically Signed   By: Markus Daft M.D.   On: 03/02/2019 13:17   Dg Ercp Biliary & Pancreatic Ducts  Result Date: 03/01/2019 CLINICAL DATA:  68 year old male with a history of transaminitis EXAM: ERCP TECHNIQUE: Multiple spot images obtained with the fluoroscopic device and submitted for interpretation post-procedure. FLUOROSCOPY TIME:  Fluoroscopy Time:  34 seconds COMPARISON:  MR February 28, 2019 FINDINGS: Limited fluoroscopic spot images and sin a images from ERCP. Initial image demonstrates endoscope projecting over the upper abdomen. There is linear radiopaque structure projecting over the liver of uncertain significance, possibly overlying the patient. Subsequently there is cannulation of the ampulla and retrograde infusion of contrast partially opacifying the biliary ducts. There is deployment of a retrieval balloon. IMPRESSION: Limited images during ERCP demonstrates partial opacification of the extrahepatic biliary ducts and deployment of a retrieval balloon. Please refer to the dictated operative report for full details of intraoperative findings and procedure. Electronically Signed   By: Corrie Mckusick D.O.   On: 03/01/2019 12:50    Anti-infectives: Anti-infectives (From admission, onward)   Start     Dose/Rate Route Frequency Ordered Stop   02/28/19 0730  piperacillin-tazobactam (ZOSYN) IVPB 3.375 g     3.375 g 12.5 mL/hr over 240 Minutes Intravenous Every 8 hours 02/28/19 0727     02/28/19 0330  piperacillin-tazobactam (ZOSYN)  IVPB 3.375 g     3.375 g 100 mL/hr over 30 Minutes Intravenous  Once 02/28/19 0327 02/28/19 0438       Assessment/Plan Acute kidney injury - resolved. 1.08 and stable  Chronic lymphocytic leukemia - stage 0 Hypertension Hx of sigmoid colectomy/?  LAR for precancerous mass 2002 Hx appendectomy/bilateral inguinal hernia repairs  Acute cholecystitis Choledocholithisis - S/P ERCP stone removal by sphincterotomy and ballon sweep -  7/27 - S/P IR Image Guided Cholecystostomy Tube Placement - 7/28 - WBC 16.4>20.1. Fever of 100.4 this AM  - T. Bili up from 1.3 to 1.8. Alk Phos from 171 to 197. AST/ALT wnl. Monitor - Bile cx pending  - Continue abx  - Mobilize and IS   FEN: FLD  ID:  [email or web address] 7/26>> KPQ:AESLPNP Follow up:  Dr. Redmond Pulling POC: Amedeo Gory (Wife) [phone]    LOS: 2 days    Jillyn Ledger , [facility] 03/03/2019, 10:01 AM Pager: [phone]

## 2019-03-03 NOTE — Progress Notes (Unsigned)
Lab work order entered into [employer] as recommended by MD; will notify patient of need for lab;

## 2019-03-03 NOTE — Plan of Care (Signed)
  Problem: Pain Managment: Goal: General experience of comfort will improve Outcome: Progressing   

## 2019-03-03 NOTE — Progress Notes (Signed)
Referring Physician(s): Newman,D  Supervising Physician: Corrie Mckusick  Patient Status:  [facility] - In-pt  Chief Complaint:  Abdominal pain, cholecystitis  Subjective: Pt c/o RUQ discomfort; some pain at drain site with coughing; denies N/V   Allergies: Patient has no known allergies.  Medications: Prior to Admission medications   Medication Sig Start Date End Date Taking? Authorizing Provider  ALPRAZolam Duanne Moron) 0.25 MG tablet Take 0.25 mg by mouth at bedtime as needed for anxiety.   Yes [provider]  buPROPion (WELLBUTRIN XL) 300 MG 24 hr tablet Take 300 mg by mouth at bedtime.   Yes [provider]  esomeprazole (NEXIUM) 40 MG capsule Take 40 mg by mouth daily.    Yes [provider]  ibuprofen (ADVIL,MOTRIN) 200 MG tablet Take 800 mg by mouth every 6 (six) hours as needed for fever or mild pain.   Yes [provider]  lisinopril (ZESTRIL) 20 MG tablet Take 20 mg by mouth daily.   Yes [provider]  traZODone (DESYREL) 100 MG tablet Take 100 mg by mouth at bedtime.   Yes [provider]  HYDROcodone-acetaminophen (NORCO/VICODIN) 5-325 MG tablet Take 2 tablets by mouth every 4 (four) hours as needed. Patient not taking: Reported on 02/28/2019 01/16/17   Tanna Furry, MD  ondansetron (ZOFRAN ODT) 4 MG disintegrating tablet Take 1 tablet (4 mg total) by mouth every 8 (eight) hours as needed for nausea. Patient not taking: Reported on 02/28/2019 01/16/17   Tanna Furry, MD  saccharomyces boulardii (FLORASTOR) 250 MG capsule Take 1 capsule (250 mg total) by mouth 2 (two) times daily. Patient not taking: Reported on 02/28/2019 01/23/17   Lavina Hamman, MD     Vital Signs: BP 130/77 (BP Location: Left Arm)    Pulse 74    Temp 99.7 F (37.6 C) (Oral)    Resp 18    Ht 6' (1.829 m)    Wt 193 lb 5.5 oz (87.7 kg)    SpO2 93%    BMI 26.22 kg/m   Physical Exam awake/alert; RUQ/GB drain intact, insertion site ok, mild- mod tender, no  leaking, output 70 cc blood-tinged bile; drain flushed without difficulty  Imaging: Dg Chest 2 View  Result Date: 02/27/2019 CLINICAL DATA:  Chest pain EXAM: CHEST - 2 VIEW COMPARISON:  12/16/2006 FINDINGS: Normal heart size, mediastinal contours, and pulmonary vascularity. Lungs clear. No infiltrate, pleural effusion or pneumothorax. Bones unremarkable. IMPRESSION: No acute abnormalities. Electronically Signed   By: Lavonia Dana M.D.   On: 02/27/2019 22:21   Mr 3d Recon At Scanner  Result Date: 02/28/2019 CLINICAL DATA:  Elevated liver tests. Right upper quadrant pain. Cholelithiasis. EXAM: MRI ABDOMEN WITHOUT AND WITH CONTRAST (INCLUDING MRCP) TECHNIQUE: Multiplanar multisequence MR imaging of the abdomen was performed both before and after the administration of intravenous contrast. Heavily T2-weighted images of the biliary and pancreatic ducts were obtained, and three-dimensional MRCP images were rendered by post processing. CONTRAST:  8 mL Gadavist COMPARISON:  02/13/2017 FINDINGS: Lower chest: Small right pleural effusion and mild right basilar atelectasis noted. Hepatobiliary: A 1.5 cm benign hemangioma is seen in segment 4 B, adjacent to the gallbladder fossa. This is stable since previous study. No other liver masses are identified. Small gallstones are seen. Gallbladder is mildly dilated and shows diffuse gallbladder wall thickening, highly suspicious for acute cholecystitis. Mild diffuse biliary ductal dilatation seen with common bile duct measuring 8 mm in diameter. At least 2 calculi are seen in the distal common bile  duct, largest measuring 6 mm. Pancreas: No mass or inflammatory changes. No evidence of pancreatic ductal dilatation or pancreas divisum. Spleen:  Within normal limits in size and appearance. Adrenals/Urinary Tract: No masses identified. No evidence of hydronephrosis. Stomach/Bowel: Visualized portion unremarkable. Vascular/Lymphatic: No pathologically enlarged lymph nodes  identified. No abdominal aortic aneurysm. Other:  None. Musculoskeletal:  No suspicious bone lesions identified. IMPRESSION: Mildly distended gallbladder with small stones and diffuse wall thickening, highly suspicious for acute cholecystitis. Mild biliary ductal dilatation, with at least 2 tiny calculi in the distal common bile duct, largest measuring 6 mm. Stable small benign hemangioma in the left hepatic lobe. Small right pleural effusion and mild right basilar atelectasis. Electronically Signed   By: Marlaine Hind M.D.   On: 02/28/2019 15:21   Ir Perc Cholecystostomy  Result Date: 03/02/2019 INDICATION: 68 year old with acute cholecystitis and history of choledocholithiasis. Plan for image guided cholecystostomy tube placement. EXAM: IMAGE GUIDED CHOLECYSTOSTOMY TUBE PLACEMENT MEDICATIONS: Moderate sedation. No additional antibiotics were given for this procedure. ANESTHESIA/SEDATION: Moderate (conscious) sedation was employed during this procedure. A total of Versed 1.0 mg and Fentanyl 50 mcg was administered intravenously. Moderate Sedation Time: 10 minutes. The patient's level of consciousness and vital signs were monitored continuously by radiology nursing throughout the procedure under my direct supervision. FLUOROSCOPY TIME:  Fluoroscopy Time: 24 seconds, 1 mGy COMPLICATIONS: None immediate. PROCEDURE: Informed written consent was obtained from the patient after a thorough discussion of the procedural risks, benefits and alternatives. All questions were addressed. Maximal Sterile Barrier Technique was utilized including caps, mask, sterile gowns, sterile gloves, sterile drape, hand hygiene and skin antiseptic. A timeout was performed prior to the initiation of the procedure. Right side of the abdomen was prepped and draped in sterile fashion. Ultrasound was used to identify the gallbladder. Percutaneous window was identified for the cholecystostomy tube placement. Skin and soft tissues were  anesthetized with 1% lidocaine. 21 gauge needle directed into the gallbladder fundus with ultrasound guidance. 0.018 wire was advanced in the gallbladder. A transitional dilator set was placed. Tract was dilated over a stiff Amplatz wire and a 10.2 [place] multipurpose drain was placed. Approximately 25 mL of thick brown fluid was removed from the gallbladder. The gallbladder was decompressed at the end of the procedure based on ultrasound. Fluid was sent for culture. Catheter was sutured to skin and attached to a gravity bag. Fluoroscopic and ultrasound images were taken and saved for documentation. FINDINGS: Irregular gallbladder wall thickening with a large amount of sludge and/or stones in the gallbladder. 25 mL of thick brown fluid was removed from the gallbladder lumen. Gallbladder was decompressed at the end of the procedure. IMPRESSION: Successful percutaneous cholecystostomy tube placement with ultrasound and fluoroscopic guidance. Electronically Signed   By: Markus Daft M.D.   On: 03/02/2019 13:17   Dg Ercp Biliary & Pancreatic Ducts  Result Date: 03/01/2019 CLINICAL DATA:  68 year old male with a history of transaminitis EXAM: ERCP TECHNIQUE: Multiple spot images obtained with the fluoroscopic device and submitted for interpretation post-procedure. FLUOROSCOPY TIME:  Fluoroscopy Time:  34 seconds COMPARISON:  MR February 28, 2019 FINDINGS: Limited fluoroscopic spot images and sin a images from ERCP. Initial image demonstrates endoscope projecting over the upper abdomen. There is linear radiopaque structure projecting over the liver of uncertain significance, possibly overlying the patient. Subsequently there is cannulation of the ampulla and retrograde infusion of contrast partially opacifying the biliary ducts. There is deployment of a retrieval balloon. IMPRESSION: Limited images during ERCP demonstrates partial opacification  of the extrahepatic biliary ducts and deployment of a retrieval balloon. Please  refer to the dictated operative report for full details of intraoperative findings and procedure. Electronically Signed   By: Corrie Mckusick D.O.   On: 03/01/2019 12:50   Mr Abdomen Mrcp Moise Boring Contast  Result Date: 02/28/2019 CLINICAL DATA:  Elevated liver tests. Right upper quadrant pain. Cholelithiasis. EXAM: MRI ABDOMEN WITHOUT AND WITH CONTRAST (INCLUDING MRCP) TECHNIQUE: Multiplanar multisequence MR imaging of the abdomen was performed both before and after the administration of intravenous contrast. Heavily T2-weighted images of the biliary and pancreatic ducts were obtained, and three-dimensional MRCP images were rendered by post processing. CONTRAST:  8 mL Gadavist COMPARISON:  02/13/2017 FINDINGS: Lower chest: Small right pleural effusion and mild right basilar atelectasis noted. Hepatobiliary: A 1.5 cm benign hemangioma is seen in segment 4 B, adjacent to the gallbladder fossa. This is stable since previous study. No other liver masses are identified. Small gallstones are seen. Gallbladder is mildly dilated and shows diffuse gallbladder wall thickening, highly suspicious for acute cholecystitis. Mild diffuse biliary ductal dilatation seen with common bile duct measuring 8 mm in diameter. At least 2 calculi are seen in the distal common bile duct, largest measuring 6 mm. Pancreas: No mass or inflammatory changes. No evidence of pancreatic ductal dilatation or pancreas divisum. Spleen:  Within normal limits in size and appearance. Adrenals/Urinary Tract: No masses identified. No evidence of hydronephrosis. Stomach/Bowel: Visualized portion unremarkable. Vascular/Lymphatic: No pathologically enlarged lymph nodes identified. No abdominal aortic aneurysm. Other:  None. Musculoskeletal:  No suspicious bone lesions identified. IMPRESSION: Mildly distended gallbladder with small stones and diffuse wall thickening, highly suspicious for acute cholecystitis. Mild biliary ductal dilatation, with at least 2 tiny  calculi in the distal common bile duct, largest measuring 6 mm. Stable small benign hemangioma in the left hepatic lobe. Small right pleural effusion and mild right basilar atelectasis. Electronically Signed   By: Marlaine Hind M.D.   On: 02/28/2019 15:21   US Abdomen Limited Ruq  Result Date: 02/28/2019 CLINICAL DATA:  68 year old male with history of right upper quadrant abdominal pain today. Elevated bilirubin. History of gallstones. EXAM: ULTRASOUND ABDOMEN LIMITED RIGHT UPPER QUADRANT COMPARISON:  Abdominal ultrasound 01/19/2017. FINDINGS: Gallbladder: Multiple echogenic foci are present within the gallbladder, some of which demonstrate distal posterior acoustic shadowing, compatible with gallstones, largest of which measures up to 8 mm. In addition, there is more amorphous echogenic material which does not shadow, compatible with biliary sludge and biliary sludge balls. Gallbladder moderately distended. Gallbladder wall is thickened measuring 6 mm. No pericholecystic fluid. Per report from the sonographer there was a sonographic Murphy's sign on examination. Common bile duct: Diameter: 4 mm proximally.  Distal duct could not be visualized. Liver: No focal lesion identified. Within normal limits in parenchymal echogenicity. Portal vein is patent on color Doppler imaging with normal direction of blood flow towards the liver. IMPRESSION: 1. Gallstones, biliary sludge and tumefactive sludge balls in the gallbladder. Gallbladder is only moderately distended, however, there is wall thickening and a positive Murphy's sign on examination. Findings are concerning for early acute cholecystitis. Electronically Signed   By: Vinnie Langton M.D.   On: 02/28/2019 04:28    Labs:  CBC: Recent Labs    02/27/19 2248 03/01/19 0254 03/02/19 0845 03/03/19 0334  WBC 23.9* 19.2* 16.4* 20.1*  HGB 14.7 12.1* 11.8* 12.2*  HCT 43.7 36.3* 35.7* 37.2*  PLT 213 210 243 294    COAGS: Recent Labs    03/01/19  9201  03/01/19 0959  INR 1.1  --   APTT  --  35    BMP: Recent Labs    02/28/19 0554 03/01/19 0254 03/02/19 0845 03/03/19 0334  NA 136 136 140 136  K 3.8 3.5 4.4 4.2  CL 102 102 109 107  CO2 24 25 26  21*  GLUCOSE 97 112* 104* 102*  BUN 14 8 8  6*  CALCIUM 8.5* 8.2* 8.3* 8.5*  CREATININE 1.16 1.09 1.11 1.08  GFRNONAA >60 >60 >60 >60  GFRAA >60 >60 >60 >60    LIVER FUNCTION TESTS: Recent Labs    02/28/19 0554 03/01/19 0254 03/02/19 0845 03/03/19 0334  BILITOT 4.0* 3.0* 1.3* 1.8*  AST 43* 34 21 21  ALT 79* 60* 42 38  ALKPHOS 214* 203* 171* 197*  PROT 6.3* 5.8* 5.5* 6.1*  ALBUMIN 2.8* 2.6* 2.5* 2.6*    Assessment and Plan: Pt with hx acute cholecystitis/choledocholithiasis; s/p perc cholecystostomy 7/28; T max 100.4 today; WBC 20.1(16.4), hgb 12.2(11.8), creat nl; t bili 1.8, bile cx pend; cont with drain irrigation tid; monitor labs; check final cx's/sens; drain will need to remain in place at least 4-6 weeks unless cholecystectomy done in interim; if drain problems encountered perform f/u cholangiogram; additional plans as per CCS/GI.   Electronically Signed: D. Rowe Robert, PA-C 03/03/2019, 9:04 AM   I spent a total of 15 minutes at the the patient's bedside AND on the patient's hospital floor or unit, greater than 50% of which was counseling/coordinating care for gallbladder drain    Patient ID: Jonathon Parker, male   DOB: May 26, 1951, 68 y.o.   MRN: [id number]

## 2019-03-04 LAB — COMPREHENSIVE METABOLIC PANEL
ALT: 36 U/L (ref 0–44)
AST: 24 U/L (ref 15–41)
Albumin: 2.7 g/dL — ABNORMAL LOW (ref 3.5–5.0)
Alkaline Phosphatase: 197 U/L — ABNORMAL HIGH (ref 38–126)
Anion gap: 9 (ref 5–15)
BUN: 6 mg/dL — ABNORMAL LOW (ref 8–23)
CO2: 25 mmol/L (ref 22–32)
Calcium: 8.8 mg/dL — ABNORMAL LOW (ref 8.9–10.3)
Chloride: 103 mmol/L (ref 98–111)
Creatinine, Ser: 1.16 mg/dL (ref 0.61–1.24)
GFR calc Af Amer: >60 mL/min (ref >60)
GFR calc non Af Amer: >60 mL/min (ref >60)
Glucose, Bld: 96 mg/dL (ref 70–99)
Potassium: 4.3 mmol/L (ref 3.5–5.1)
Sodium: 137 mmol/L (ref 135–145)
Total Bilirubin: 1.9 mg/dL — ABNORMAL HIGH (ref 0.3–1.2)
Total Protein: 6.1 g/dL — ABNORMAL LOW (ref 6.5–8.1)

## 2019-03-04 LAB — CBC
HCT: 36.2 % — ABNORMAL LOW (ref 39.0–52.0)
Hemoglobin: 12.1 g/dL — ABNORMAL LOW (ref 13.0–17.0)
MCH: 32.6 pg (ref 26.0–34.0)
MCHC: 33.4 g/dL (ref 30.0–36.0)
MCV: 97.6 fL (ref 80.0–100.0)
Platelets: 327 10*3/uL (ref 150–400)
RBC: 3.71 MIL/uL — ABNORMAL LOW (ref 4.22–5.81)
RDW: 11.9 % (ref 11.5–15.5)
WBC: 23.2 10*3/uL — ABNORMAL HIGH (ref 4.0–10.5)
nRBC: 0 % (ref 0.0–0.2)

## 2019-03-04 LAB — LIPASE, BLOOD: Lipase: 20 U/L (ref 11–51)

## 2019-03-04 MED ORDER — KETOROLAC TROMETHAMINE 10 MG PO TABS
10.0000 mg | ORAL_TABLET | Freq: Four times a day (QID) | ORAL | Status: DC
  Administered 2019-03-04 – 2019-03-05 (×4): 10 mg via ORAL
  Filled 2019-03-04 (×7): qty 1

## 2019-03-04 NOTE — Progress Notes (Signed)
Referring Physician(s): Dr. Lucia Gaskins  Supervising Physician: Jacqulynn Cadet  Patient Status:  [facility] - In-pt  Chief Complaint: Cholecystitis  Subjective: Drain in place.  Tender to touch. Bilious output. Febrile overnight.  WBC elevated today.   Allergies: Patient has no known allergies.  Medications: Prior to Admission medications   Medication Sig Start Date End Date Taking? Authorizing Provider  ALPRAZolam Duanne Moron) 0.25 MG tablet Take 0.25 mg by mouth at bedtime as needed for anxiety.   Yes [provider]  buPROPion (WELLBUTRIN XL) 300 MG 24 hr tablet Take 300 mg by mouth at bedtime.   Yes [provider]  esomeprazole (NEXIUM) 40 MG capsule Take 40 mg by mouth daily.    Yes [provider]  ibuprofen (ADVIL,MOTRIN) 200 MG tablet Take 800 mg by mouth every 6 (six) hours as needed for fever or mild pain.   Yes [provider]  lisinopril (ZESTRIL) 20 MG tablet Take 20 mg by mouth daily.   Yes [provider]  traZODone (DESYREL) 100 MG tablet Take 100 mg by mouth at bedtime.   Yes [provider]  HYDROcodone-acetaminophen (NORCO/VICODIN) 5-325 MG tablet Take 2 tablets by mouth every 4 (four) hours as needed. Patient not taking: Reported on 02/28/2019 01/16/17   Tanna Furry, MD  ondansetron (ZOFRAN ODT) 4 MG disintegrating tablet Take 1 tablet (4 mg total) by mouth every 8 (eight) hours as needed for nausea. Patient not taking: Reported on 02/28/2019 01/16/17   Tanna Furry, MD  saccharomyces boulardii (FLORASTOR) 250 MG capsule Take 1 capsule (250 mg total) by mouth 2 (two) times daily. Patient not taking: Reported on 02/28/2019 01/23/17   Lavina Hamman, MD     Vital Signs: BP (!) 159/93 (BP Location: Left Arm)    Pulse 72    Temp 98.9 F (37.2 C) (Oral)    Resp 17    Ht 6' (1.829 m)    Wt 193 lb 5.5 oz (87.7 kg)    SpO2 96%    BMI 26.22 kg/m   Physical Exam Vitals signs and nursing note reviewed.   NAD, resting  comfortably in bed Abdomen: soft, exquisitely tender to touch at drain site. Bilious output in collection bag, no visible blood.  Insertion site intact.  No bruising.   Imaging: Mr 3d Recon At Scanner  Result Date: 02/28/2019 CLINICAL DATA:  Elevated liver tests. Right upper quadrant pain. Cholelithiasis. EXAM: MRI ABDOMEN WITHOUT AND WITH CONTRAST (INCLUDING MRCP) TECHNIQUE: Multiplanar multisequence MR imaging of the abdomen was performed both before and after the administration of intravenous contrast. Heavily T2-weighted images of the biliary and pancreatic ducts were obtained, and three-dimensional MRCP images were rendered by post processing. CONTRAST:  8 mL Gadavist COMPARISON:  02/13/2017 FINDINGS: Lower chest: Small right pleural effusion and mild right basilar atelectasis noted. Hepatobiliary: A 1.5 cm benign hemangioma is seen in segment 4 B, adjacent to the gallbladder fossa. This is stable since previous study. No other liver masses are identified. Small gallstones are seen. Gallbladder is mildly dilated and shows diffuse gallbladder wall thickening, highly suspicious for acute cholecystitis. Mild diffuse biliary ductal dilatation seen with common bile duct measuring 8 mm in diameter. At least 2 calculi are seen in the distal common bile duct, largest measuring 6 mm. Pancreas: No mass or inflammatory changes. No evidence of pancreatic ductal dilatation or pancreas divisum. Spleen:  Within normal limits in size and appearance. Adrenals/Urinary Tract: No masses identified. No evidence of hydronephrosis. Stomach/Bowel: Visualized portion unremarkable.  Vascular/Lymphatic: No pathologically enlarged lymph nodes identified. No abdominal aortic aneurysm. Other:  None. Musculoskeletal:  No suspicious bone lesions identified. IMPRESSION: Mildly distended gallbladder with small stones and diffuse wall thickening, highly suspicious for acute cholecystitis. Mild biliary ductal dilatation, with at least 2 tiny  calculi in the distal common bile duct, largest measuring 6 mm. Stable small benign hemangioma in the left hepatic lobe. Small right pleural effusion and mild right basilar atelectasis. Electronically Signed   By: Marlaine Hind M.D.   On: 02/28/2019 15:21   Ir Perc Cholecystostomy  Result Date: 03/02/2019 INDICATION: 68 year old with acute cholecystitis and history of choledocholithiasis. Plan for image guided cholecystostomy tube placement. EXAM: IMAGE GUIDED CHOLECYSTOSTOMY TUBE PLACEMENT MEDICATIONS: Moderate sedation. No additional antibiotics were given for this procedure. ANESTHESIA/SEDATION: Moderate (conscious) sedation was employed during this procedure. A total of Versed 1.0 mg and Fentanyl 50 mcg was administered intravenously. Moderate Sedation Time: 10 minutes. The patient's level of consciousness and vital signs were monitored continuously by radiology nursing throughout the procedure under my direct supervision. FLUOROSCOPY TIME:  Fluoroscopy Time: 24 seconds, 1 mGy COMPLICATIONS: None immediate. PROCEDURE: Informed written consent was obtained from the patient after a thorough discussion of the procedural risks, benefits and alternatives. All questions were addressed. Maximal Sterile Barrier Technique was utilized including caps, mask, sterile gowns, sterile gloves, sterile drape, hand hygiene and skin antiseptic. A timeout was performed prior to the initiation of the procedure. Right side of the abdomen was prepped and draped in sterile fashion. Ultrasound was used to identify the gallbladder. Percutaneous window was identified for the cholecystostomy tube placement. Skin and soft tissues were anesthetized with 1% lidocaine. 21 gauge needle directed into the gallbladder fundus with ultrasound guidance. 0.018 wire was advanced in the gallbladder. A transitional dilator set was placed. Tract was dilated over a stiff Amplatz wire and a 10.2 [place] multipurpose drain was placed. Approximately 25 mL  of thick brown fluid was removed from the gallbladder. The gallbladder was decompressed at the end of the procedure based on ultrasound. Fluid was sent for culture. Catheter was sutured to skin and attached to a gravity bag. Fluoroscopic and ultrasound images were taken and saved for documentation. FINDINGS: Irregular gallbladder wall thickening with a large amount of sludge and/or stones in the gallbladder. 25 mL of thick brown fluid was removed from the gallbladder lumen. Gallbladder was decompressed at the end of the procedure. IMPRESSION: Successful percutaneous cholecystostomy tube placement with ultrasound and fluoroscopic guidance. Electronically Signed   By: Markus Daft M.D.   On: 03/02/2019 13:17   Dg Ercp Biliary & Pancreatic Ducts  Result Date: 03/01/2019 CLINICAL DATA:  68 year old male with a history of transaminitis EXAM: ERCP TECHNIQUE: Multiple spot images obtained with the fluoroscopic device and submitted for interpretation post-procedure. FLUOROSCOPY TIME:  Fluoroscopy Time:  34 seconds COMPARISON:  MR February 28, 2019 FINDINGS: Limited fluoroscopic spot images and sin a images from ERCP. Initial image demonstrates endoscope projecting over the upper abdomen. There is linear radiopaque structure projecting over the liver of uncertain significance, possibly overlying the patient. Subsequently there is cannulation of the ampulla and retrograde infusion of contrast partially opacifying the biliary ducts. There is deployment of a retrieval balloon. IMPRESSION: Limited images during ERCP demonstrates partial opacification of the extrahepatic biliary ducts and deployment of a retrieval balloon. Please refer to the dictated operative report for full details of intraoperative findings and procedure. Electronically Signed   By: Corrie Mckusick D.O.   On: 03/01/2019 12:50  Mr Abdomen Mrcp Moise Boring Contast  Result Date: 02/28/2019 CLINICAL DATA:  Elevated liver tests. Right upper quadrant pain.  Cholelithiasis. EXAM: MRI ABDOMEN WITHOUT AND WITH CONTRAST (INCLUDING MRCP) TECHNIQUE: Multiplanar multisequence MR imaging of the abdomen was performed both before and after the administration of intravenous contrast. Heavily T2-weighted images of the biliary and pancreatic ducts were obtained, and three-dimensional MRCP images were rendered by post processing. CONTRAST:  8 mL Gadavist COMPARISON:  02/13/2017 FINDINGS: Lower chest: Small right pleural effusion and mild right basilar atelectasis noted. Hepatobiliary: A 1.5 cm benign hemangioma is seen in segment 4 B, adjacent to the gallbladder fossa. This is stable since previous study. No other liver masses are identified. Small gallstones are seen. Gallbladder is mildly dilated and shows diffuse gallbladder wall thickening, highly suspicious for acute cholecystitis. Mild diffuse biliary ductal dilatation seen with common bile duct measuring 8 mm in diameter. At least 2 calculi are seen in the distal common bile duct, largest measuring 6 mm. Pancreas: No mass or inflammatory changes. No evidence of pancreatic ductal dilatation or pancreas divisum. Spleen:  Within normal limits in size and appearance. Adrenals/Urinary Tract: No masses identified. No evidence of hydronephrosis. Stomach/Bowel: Visualized portion unremarkable. Vascular/Lymphatic: No pathologically enlarged lymph nodes identified. No abdominal aortic aneurysm. Other:  None. Musculoskeletal:  No suspicious bone lesions identified. IMPRESSION: Mildly distended gallbladder with small stones and diffuse wall thickening, highly suspicious for acute cholecystitis. Mild biliary ductal dilatation, with at least 2 tiny calculi in the distal common bile duct, largest measuring 6 mm. Stable small benign hemangioma in the left hepatic lobe. Small right pleural effusion and mild right basilar atelectasis. Electronically Signed   By: Marlaine Hind M.D.   On: 02/28/2019 15:21    Labs:  CBC: Recent Labs     03/01/19 0254 03/02/19 0845 03/03/19 0334 03/04/19 0115  WBC 19.2* 16.4* 20.1* 23.2*  HGB 12.1* 11.8* 12.2* 12.1*  HCT 36.3* 35.7* 37.2* 36.2*  PLT 210 243 294 327    COAGS: Recent Labs    03/01/19 0254 03/01/19 0959  INR 1.1  --   APTT  --  35    BMP: Recent Labs    03/01/19 0254 03/02/19 0845 03/03/19 0334 03/04/19 0115  NA 136 140 136 137  K 3.5 4.4 4.2 4.3  CL 102 109 107 103  CO2 25 26 21* 25  GLUCOSE 112* 104* 102* 96  BUN 8 8 6* 6*  CALCIUM 8.2* 8.3* 8.5* 8.8*  CREATININE 1.09 1.11 1.08 1.16  GFRNONAA >60 >60 >60 >60  GFRAA >60 >60 >60 >60    LIVER FUNCTION TESTS: Recent Labs    03/01/19 0254 03/02/19 0845 03/03/19 0334 03/04/19 0115  BILITOT 3.0* 1.3* 1.8* 1.9*  AST 34 21 21 24   ALT 60* 42 38 36  ALKPHOS 203* 171* 197* 197*  PROT 5.8* 5.5* 6.1* 6.1*  ALBUMIN 2.6* 2.5* 2.6* 2.7*    Assessment and Plan: Cholecystitis s/p ERCP, now s/p cholecsytostomy tube placement in IR 7/28 Patient with tenderness to drain placement site. Insertion site is intact, clean.  No bruising or bleeding.  Tenderness is generalized to abdomen as well.  Bilious output. No bloody output today.  Culture pending; NGTD Elevated WBC-- up to 23.2.  Tmax 100.4 overnight. Attempting to get OOB but limited by pain.  SCr 1.16. Will order toradol 10mg  PO q6 x2 days.   Electronically Signed: Docia Barrier, PA 03/04/2019, 12:12 PM   I spent a total of 15 Minutes at the  the patient's bedside AND on the patient's hospital floor or unit, greater than 50% of which was counseling/coordinating care for cholecystitis.

## 2019-03-04 NOTE — Care Management Important Message (Signed)
Important Message  Patient Details  Name: Jonathon Parker MRN: [id number] Date of Birth: Jun 08, 1951   Medicare Important Message Given:        Memory Argue 03/04/2019, 2:18 PM

## 2019-03-04 NOTE — Progress Notes (Signed)
3 Days Post-Op    CC: abdominal pain  Subjective: Still having a lot of pain,not eating much.   Objective: Vital signs in last 24 hours: Temp:  [98.9 F (37.2 C)-99.3 F (37.4 C)] 98.9 F (37.2 C) (07/30 0546) Pulse Rate:  [70-72] 72 (07/30 0546) Resp:  [17-18] 17 (07/30 0546) BP: (153-159)/(83-93) 159/93 (07/30 0546) SpO2:  [94 %-99 %] 96 % (07/30 0608) Last BM Date: 03/01/19 822 PO 2733 IV Urine 2400 Drain 70 Afebrile, VSS WBC up to 23.2   Bile culture: ABUNDANT CITROBACTER SPECIES  CULTURE REINCUBATED FOR BETTER GROWTH    Intake/Output from previous day: 07/29 0701 - 07/30 0700 In: 3605.3 [P.O.:822; I.V.:2733.3; IV Piggyback:50] Out: 9562 [Urine:2400; Drains:70] Intake/Output this shift: No intake/output data recorded.  General appearance: alert, cooperative and no distress Resp: clear to auscultation bilaterally GI: tender,RUQ, tolerating diet,just not hungry.   Lab Results:  Recent Labs    03/03/19 0334 03/04/19 0115  WBC 20.1* 23.2*  HGB 12.2* 12.1*  HCT 37.2* 36.2*  PLT 294 327    BMET Recent Labs    03/03/19 0334 03/04/19 0115  NA 136 137  K 4.2 4.3  CL 107 103  CO2 21* 25  GLUCOSE 102* 96  BUN 6* 6*  CREATININE 1.08 1.16  CALCIUM 8.5* 8.8*   PT/INR No results for input(s): LABPROT, INR in the last 72 hours.  Recent Labs  Lab 02/28/19 0554 03/01/19 0254 03/02/19 0845 03/03/19 0334 03/04/19 0115  AST 43* 34 _0 ALT 79* 60* 42 38 36  ALKPHOS 214* 203* 171* 197* 197*  BILITOT 4.0* 3.0* 1.3* 1.8* 1.9*  PROT 6.3* 5.8* 5.5* 6.1* 6.1*  ALBUMIN 2.8* 2.6* 2.5* 2.6* 2.7*     Lipase     Component Value Date/Time   LIPASE 30 02/27/2019 2248     Medications: . buPROPion  300 mg Oral QHS  . Chlorhexidine Gluconate Cloth  6 each Topical Daily  . enoxaparin (LOVENOX) injection  40 mg Subcutaneous Q24H  . feeding supplement (ENSURE ENLIVE)  237 mL Oral BID BM  . multivitamin with minerals  1 tablet Oral Daily  . mupirocin  ointment  1 application Nasal BID  . pantoprazole  40 mg Oral QHS    Assessment/Plan Acute kidney injury - resolved. 1.08 and stable  Chronic lymphocytic leukemia - stage 0 Hypertension Hx of sigmoid colectomy/? LAR for precancerous mass 2002 Hx appendectomy/bilateral inguinal hernia repairs  Acute cholecystitis Choledocholithisis - S/P ERCP stone removal by sphincterotomy and ballon sweep -  7/27 - S/P IR Image Guided Cholecystostomy Tube Placement - 7/28 - WBC 16.4>20.1. Fever of 100.4 this AM  - T. Bili up from 1.3 to 1.8. Alk Phos from 171 to 197. AST/ALT wnl. Monitor - Bile cx pending  - Continue abx  - Mobilize and IS   FEN: FLD  ID: Zosyn 7/26>> day 5 [id number] Follow up: Dr. Redmond Pulling POC: Amedeo Gory (Wife) [phone]  Plan: continue antibiotics, advance diet and see how he does. Await cultures    LOS: 3 days    Sajjad Honea 03/04/2019 [phone]

## 2019-03-05 LAB — COMPREHENSIVE METABOLIC PANEL
ALT: 28 U/L (ref 0–44)
AST: 17 U/L (ref 15–41)
Albumin: 2.3 g/dL — ABNORMAL LOW (ref 3.5–5.0)
Alkaline Phosphatase: 184 U/L — ABNORMAL HIGH (ref 38–126)
Anion gap: 6 (ref 5–15)
BUN: 11 mg/dL (ref 8–23)
CO2: 26 mmol/L (ref 22–32)
Calcium: 8.4 mg/dL — ABNORMAL LOW (ref 8.9–10.3)
Chloride: 105 mmol/L (ref 98–111)
Creatinine, Ser: 1.28 mg/dL — ABNORMAL HIGH (ref 0.61–1.24)
GFR calc Af Amer: >60 mL/min (ref >60)
GFR calc non Af Amer: 57 mL/min — ABNORMAL LOW (ref >60)
Glucose, Bld: 110 mg/dL — ABNORMAL HIGH (ref 70–99)
Potassium: 4.3 mmol/L (ref 3.5–5.1)
Sodium: 137 mmol/L (ref 135–145)
Total Bilirubin: 1.2 mg/dL (ref 0.3–1.2)
Total Protein: 5.5 g/dL — ABNORMAL LOW (ref 6.5–8.1)

## 2019-03-05 LAB — CBC
HCT: 33.5 % — ABNORMAL LOW (ref 39.0–52.0)
Hemoglobin: 11 g/dL — ABNORMAL LOW (ref 13.0–17.0)
MCH: 32.4 pg (ref 26.0–34.0)
MCHC: 32.8 g/dL (ref 30.0–36.0)
MCV: 98.5 fL (ref 80.0–100.0)
Platelets: 315 10*3/uL (ref 150–400)
RBC: 3.4 MIL/uL — ABNORMAL LOW (ref 4.22–5.81)
RDW: 12.1 % (ref 11.5–15.5)
WBC: 21 10*3/uL — ABNORMAL HIGH (ref 4.0–10.5)
nRBC: 0 % (ref 0.0–0.2)

## 2019-03-05 MED ORDER — KCL IN DEXTROSE-NACL 40-5-0.9 MEQ/L-%-% IV SOLN
INTRAVENOUS | Status: AC
  Administered 2019-03-05: 12:00:00 via INTRAVENOUS
  Filled 2019-03-05: qty 1000

## 2019-03-05 MED ORDER — KCL IN DEXTROSE-NACL 40-5-0.9 MEQ/L-%-% IV SOLN: INTRAVENOUS | Status: DC

## 2019-03-05 MED ORDER — DEXTROSE-NACL 5-0.9 % IV SOLN
INTRAVENOUS | Status: DC
  Administered 2019-03-05 – 2019-03-07 (×7): via INTRAVENOUS

## 2019-03-05 NOTE — Progress Notes (Signed)
Nutrition Follow-up  DOCUMENTATION CODES:   Not applicable  INTERVENTION:   -Continue MVI with minerals daily -Continue Magic cup TID with meals, each supplement provides 290 kcal and 9 grams of protein -D/c Ensure Enlive po BID, each supplement provides 350 kcal and 20 grams of protein  NUTRITION DIAGNOSIS:   Inadequate oral intake related to altered GI function as evidenced by per patient/family report.  Progressing; advanced to soft diet  GOAL:   Patient will meet greater than or equal to 90% of their needs  Progressing   MONITOR:   PO intake, Supplement acceptance, Diet advancement, Labs, Weight trends, Skin, I & O's  REASON FOR ASSESSMENT:   Malnutrition Screening Tool, Consult Assessment of nutrition requirement/status  ASSESSMENT:   68 year old male with history of hypertension, CLL stage 0 presents to the emergency room for ongoing nausea and poor appetite.  He also has had right upper quadrant pain.  He states his symptoms developed last Sunday after eating a tomato sandwich that tasted funny.  He immediately had multiple episodes of nausea and vomiting.  He also had discomfort from all the vomiting.  He states that he would have waves of pain in the right upper abdomen.  He describes it as a stabbing sensation that would last 10 minutes or so but would repeat quite frequently.  He had some back pain but he attributed that to all the vomiting.  He continued to have episodes of nausea throughout the week.  He only vomited again on Monday.  He developed some fever but he cannot remember the day that he broke out in a fever.  He had poor appetite and is only had 3 peanut butter crackers in the past 3 days.  His wife has been encouraged him to drink liquids.  He finally came to the emergency room when he lost his desire to drink liquids.  No diarrhea.  Last bowel movement was day before yesterday.  7/27- s/p ERCP and biliary sphincterotomy and balloon sweep, stones  found 7/28- s/p cholecystotomy tube placement (revealed gallstones and 25 ml brown thick fluid removed) 7/29- advanced to full liquid diet 7/30- advanced to soft diet  Reviewed I/O's: -160 ml x 24 hours and +2.5 L since admission  UOP: 625 ml x 24 hours  Drains: 75 ml x 24 hours  Pt has been advanced to soft diet and intake has improved. Noted meal completion 60-100%. Pt is refusing Ensure supplements.   Labs reviewed.   Diet Order:   Diet Order            DIET SOFT Room service appropriate? Yes; Fluid consistency: Thin  Diet effective now              EDUCATION NEEDS:   No education needs have been identified at this time  Skin:  Skin Assessment: Reviewed RN Assessment  Last BM:  03/01/19  Height:   Ht Readings from Last 1 Encounters:  02/28/19 6' (1.829 m)    Weight:   Wt Readings from Last 1 Encounters:  02/28/19 87.7 kg    Ideal Body Weight:  80.9 kg  BMI:  Body mass index is 26.22 kg/m.  Estimated Nutritional Needs:   Kcal:  2200-2400  Protein:  105-120 grams  Fluid:  > 2.2 L    Tacori Kvamme A. Jimmye Norman, RD, LDN, [place] Registered Dietitian II Certified Diabetes Care and Education Specialist Pager: [phone] After hours Pager: [phone]

## 2019-03-05 NOTE — Progress Notes (Signed)
Referring Physician(s): Dr. Lucia Gaskins  Supervising Physician: Jacqulynn Cadet  Patient Status:  [facility] - In-pt  Chief Complaint: Cholecystitis  Subjective: Drain in place.  Tender to touch but less so today.  Bilious output. Afebrile overnight.  WBC remains elevated today.  Per surgery note may be related to his CLL.  Allergies: Patient has no known allergies.  Medications: Prior to Admission medications   Medication Sig Start Date End Date Taking? Authorizing Provider  ALPRAZolam Duanne Moron) 0.25 MG tablet Take 0.25 mg by mouth at bedtime as needed for anxiety.   Yes [provider]  buPROPion (WELLBUTRIN XL) 300 MG 24 hr tablet Take 300 mg by mouth at bedtime.   Yes [provider]  esomeprazole (NEXIUM) 40 MG capsule Take 40 mg by mouth daily.    Yes [provider]  ibuprofen (ADVIL,MOTRIN) 200 MG tablet Take 800 mg by mouth every 6 (six) hours as needed for fever or mild pain.   Yes [provider]  lisinopril (ZESTRIL) 20 MG tablet Take 20 mg by mouth daily.   Yes [provider]  traZODone (DESYREL) 100 MG tablet Take 100 mg by mouth at bedtime.   Yes [provider]  HYDROcodone-acetaminophen (NORCO/VICODIN) 5-325 MG tablet Take 2 tablets by mouth every 4 (four) hours as needed. Patient not taking: Reported on 02/28/2019 01/16/17   Tanna Furry, MD  ondansetron (ZOFRAN ODT) 4 MG disintegrating tablet Take 1 tablet (4 mg total) by mouth every 8 (eight) hours as needed for nausea. Patient not taking: Reported on 02/28/2019 01/16/17   Tanna Furry, MD  saccharomyces boulardii (FLORASTOR) 250 MG capsule Take 1 capsule (250 mg total) by mouth 2 (two) times daily. Patient not taking: Reported on 02/28/2019 01/23/17   Lavina Hamman, MD     Vital Signs: BP (!) 154/88 (BP Location: Left Arm)    Pulse (!) 59    Temp 98 F (36.7 C) (Oral)    Resp 17    Ht 6' (1.829 m)    Wt 193 lb 5.5 oz (87.7 kg)    SpO2 97%    BMI 26.22 kg/m    Physical Exam Vitals signs and nursing note reviewed.   NAD, resting comfortably in bed Abdomen: soft, remains tender to touch at drain site.  Small amount of bruising at insertion site.  Intact.  Bilious output in collection bag, no visible blood.    Imaging: Ir Perc Cholecystostomy  Result Date: 03/02/2019 INDICATION: 68 year old with acute cholecystitis and history of choledocholithiasis. Plan for image guided cholecystostomy tube placement. EXAM: IMAGE GUIDED CHOLECYSTOSTOMY TUBE PLACEMENT MEDICATIONS: Moderate sedation. No additional antibiotics were given for this procedure. ANESTHESIA/SEDATION: Moderate (conscious) sedation was employed during this procedure. A total of Versed 1.0 mg and Fentanyl 50 mcg was administered intravenously. Moderate Sedation Time: 10 minutes. The patient's level of consciousness and vital signs were monitored continuously by radiology nursing throughout the procedure under my direct supervision. FLUOROSCOPY TIME:  Fluoroscopy Time: 24 seconds, 1 mGy COMPLICATIONS: None immediate. PROCEDURE: Informed written consent was obtained from the patient after a thorough discussion of the procedural risks, benefits and alternatives. All questions were addressed. Maximal Sterile Barrier Technique was utilized including caps, mask, sterile gowns, sterile gloves, sterile drape, hand hygiene and skin antiseptic. A timeout was performed prior to the initiation of the procedure. Right side of the abdomen was prepped and draped in sterile fashion. Ultrasound was used to identify the gallbladder. Percutaneous window was identified for the cholecystostomy tube placement. Skin  and soft tissues were anesthetized with 1% lidocaine. 21 gauge needle directed into the gallbladder fundus with ultrasound guidance. 0.018 wire was advanced in the gallbladder. A transitional dilator set was placed. Tract was dilated over a stiff Amplatz wire and a 10.2 [place] multipurpose drain was placed.  Approximately 25 mL of thick brown fluid was removed from the gallbladder. The gallbladder was decompressed at the end of the procedure based on ultrasound. Fluid was sent for culture. Catheter was sutured to skin and attached to a gravity bag. Fluoroscopic and ultrasound images were taken and saved for documentation. FINDINGS: Irregular gallbladder wall thickening with a large amount of sludge and/or stones in the gallbladder. 25 mL of thick brown fluid was removed from the gallbladder lumen. Gallbladder was decompressed at the end of the procedure. IMPRESSION: Successful percutaneous cholecystostomy tube placement with ultrasound and fluoroscopic guidance. Electronically Signed   By: Markus Daft M.D.   On: 03/02/2019 13:17    Labs:  CBC: Recent Labs    03/02/19 0845 03/03/19 0334 03/04/19 0115 03/05/19 0352  WBC 16.4* 20.1* 23.2* 21.0*  HGB 11.8* 12.2* 12.1* 11.0*  HCT 35.7* 37.2* 36.2* 33.5*  PLT 243 294 327 315    COAGS: Recent Labs    03/01/19 0254 03/01/19 0959  INR 1.1  --   APTT  --  35    BMP: Recent Labs    03/02/19 0845 03/03/19 0334 03/04/19 0115 03/05/19 0352  NA 140 136 137 137  K 4.4 4.2 4.3 4.3  CL 109 107 103 105  CO2 26 21* 25 26  GLUCOSE 104* 102* 96 110*  BUN 8 6* 6* 11  CALCIUM 8.3* 8.5* 8.8* 8.4*  CREATININE 1.11 1.08 1.16 1.28*  GFRNONAA >60 >60 >60 57*  GFRAA >60 >60 >60 >60    LIVER FUNCTION TESTS: Recent Labs    03/02/19 0845 03/03/19 0334 03/04/19 0115 03/05/19 0352  BILITOT 1.3* 1.8* 1.9* 1.2  AST 21 21 24 17   ALT 42 38 36 28  ALKPHOS 171* 197* 197* 184*  PROT 5.5* 6.1* 6.1* 5.5*  ALBUMIN 2.5* 2.6* 2.7* 2.3*    Assessment and Plan: Cholecystitis s/p ERCP, now s/p cholecsytostomy tube placement in IR 7/28 Patient with tenderness to drain placement site, improved today. Insertion site is intact, clean, with small amount of bruising.  No active bleeding. Bilious output.  Culture pending; with Citrobacter brachii,  Klebsiella Elevated WBC--now at 21K.  Afebrile. Creatinine elevated today so will discontinue Toradol. Continue flushes. IR following.  Electronically Signed: Docia Barrier, PA 03/05/2019, 12:02 PM   I spent a total of 15 Minutes at the the patient's bedside AND on the patient's hospital floor or unit, greater than 50% of which was counseling/coordinating care for cholecystitis.

## 2019-03-05 NOTE — Plan of Care (Signed)
  Problem: Pain Managment: Goal: General experience of comfort will improve Outcome: Progressing   

## 2019-03-05 NOTE — Progress Notes (Signed)
4 Days Post-Op    CC: Abdominal pain  Subjective: He feels a looks much better.  Pain is markedly improved since yesterday.  He is also eating a little bit more.  Objective: Vital signs in last 24 hours: Temp:  [98 F (36.7 C)-98.8 F (37.1 C)] 98 F (36.7 C) (07/31 0509) Pulse Rate:  [59-75] 59 (07/31 0509) Resp:  [17] 17 (07/31 0509) BP: (119-154)/(80-88) 154/88 (07/31 0509) SpO2:  [92 %-97 %] 97 % (07/31 0509) Last BM Date: 03/01/19 540 p.o. 625 urine 75 from the drain Afebrile vital signs are stable Creatinines up to 1.2 a white count still elevated at 21,000. Culture is below and Zosyn is providing good coverage.  Intake/Output from previous day: 07/30 0701 - 07/31 0700 In: 540 [P.O.:540] Out: 700 [Urine:625; Drains:75] Intake/Output this shift: Total I/O In: 240 [P.O.:240] Out: 400 [Urine:400]  General appearance: alert, cooperative and no distress Resp: clear to auscultation bilaterally GI: Much less tender.  Drains in place.  Lab Results:  Recent Labs    03/04/19 0115 03/05/19 0352  WBC 23.2* 21.0*  HGB 12.1* 11.0*  HCT 36.2* 33.5*  PLT 327 315    BMET Recent Labs    03/04/19 0115 03/05/19 0352  NA 137 137  K 4.3 4.3  CL 103 105  CO2 25 26  GLUCOSE 96 110*  BUN 6* 11  CREATININE 1.16 1.28*  CALCIUM 8.8* 8.4*   PT/INR No results for input(s): LABPROT, INR in the last 72 hours.  Recent Labs  Lab 03/01/19 0254 03/02/19 0845 03/03/19 0334 03/04/19 0115 03/05/19 0352  AST 34 '21 21 24 17  ' ALT 60* 42 38 36 28  ALKPHOS 203* 171* 197* 197* 184*  BILITOT 3.0* 1.3* 1.8* 1.9* 1.2  PROT 5.8* 5.5* 6.1* 6.1* 5.5*  ALBUMIN 2.6* 2.5* 2.6* 2.7* 2.3*     Lipase     Component Value Date/Time   LIPASE 20 03/04/2019 0115     Medications: . buPROPion  300 mg Oral QHS  . enoxaparin (LOVENOX) injection  40 mg Subcutaneous Q24H  . feeding supplement (ENSURE ENLIVE)  237 mL Oral BID BM  . ketorolac  10 mg Oral Q6H  . multivitamin with minerals   1 tablet Oral Daily  . mupirocin ointment  1 application Nasal BID  . pantoprazole  40 mg Oral QHS   Specimen Description BILE   Special Requests Normal   Gram Stain MODERATE WBC PRESENT, PREDOMINANTLY PMN  FEW GRAM VARIABLE ROD   Culture ABUNDANT CITROBACTER BRAAKII  MODERATE KLEBSIELLA OXYTOCA  HOLDING FOR POSSIBLE ANAEROBE  Performed at [facility], [place] [address], [place], [place] [address]   Report Status PENDING   Organism ID, Bacteria CITROBACTER BRAAKII   Organism ID, Bacteria KLEBSIELLA OXYTOCA   Resulting Agency [facility]  Susceptibility   Citrobacter braakii Klebsiella oxytoca    MIC MIC    AMPICILLIN   >=32 RESIST... Resistant    AMPICILLIN/SULBACTAM   8 SENSITIVE  Sensitive    CEFAZOLIN >=64 RESIST... Resistant <=4 SENSITIVE  Sensitive    CEFEPIME <=1 SENSITIVE  Sensitive <=1 SENSITIVE  Sensitive    CEFTAZIDIME <=1 SENSITIVE  Sensitive <=1 SENSITIVE  Sensitive    CEFTRIAXONE <=1 SENSITIVE  Sensitive <=1 SENSITIVE  Sensitive    CIPROFLOXACIN <=0.25 SENS... Sensitive <=0.25 SENS... Sensitive    Extended ESBL   NEGATIVE  Sensitive    GENTAMICIN <=1 SENSITIVE  Sensitive <=1 SENSITIVE  Sensitive    IMIPENEM 0.5 SENSITIVE  Sensitive <=  0.25 SENS... Sensitive    PIP/TAZO <=4 SENSITIVE  Sensitive <=4 SENSITIVE  Sensitive    TRIMETH/SULFA <=20 SENSIT... Sensitive <=20 SENSIT... Sensitive      . dextrose 5 % and 0.9 % NaCl with KCl 40 mEq/L 100 mL/hr at 03/05/19 0138  . piperacillin-tazobactam (ZOSYN)  IV 3.375 g (03/05/19 0516)   . dextrose 5 % and 0.9 % NaCl with KCl 40 mEq/L 100 mL/hr at 03/05/19 0138  . piperacillin-tazobactam (ZOSYN)  IV 3.375 g (03/05/19 0516)    Assessment/Plan Acute kidney injury -resolved. 1.08 and stable Chronic lymphocytic leukemia - stage 0 Hypertension Hx of sigmoid colectomy/? LAR for precancerous mass 2002 Hx appendectomy/bilateral inguinal hernia repairs  Acute cholecystitis Choledocholithisis - S/P ERCP stone removal  by sphincterotomy and ballon sweep - 7/27 - S/P IR Image Guided Cholecystostomy Tube Placement - 7/28 - WBC 16.4>20.1. Fever of 100.4 this AM - T. Bili up from 1.3 to 1.8. Alk Phos from 171 to 197. AST/ALT wnl. Monitor - Bile cx pending  - Continue abx  - Mobilize and IS  FEN:FLD ID: Zosyn 7/26>> day 6 YWV:[id number] Follow up: Dr. Redmond Pulling RSW:NIOEVOJJ Stahnke (Wife) [phone]  His WBC is up but it may be due to his CLL.  Creatinine is rising so I think he needs ongoing IV fluid hydration.  I will change his IV fluids..  Recheck labs in a.m.  Continue current antibiotics.  We will also need to discuss discharge antibiotic choices and treatment time.       LOS: 4 days    Kynzie Polgar 03/05/2019 [phone]

## 2019-03-06 LAB — BASIC METABOLIC PANEL
Anion gap: 6 (ref 5–15)
BUN: 10 mg/dL (ref 8–23)
CO2: 25 mmol/L (ref 22–32)
Calcium: 8.5 mg/dL — ABNORMAL LOW (ref 8.9–10.3)
Chloride: 107 mmol/L (ref 98–111)
Creatinine, Ser: 1.26 mg/dL — ABNORMAL HIGH (ref 0.61–1.24)
GFR calc Af Amer: >60 mL/min (ref >60)
GFR calc non Af Amer: 58 mL/min — ABNORMAL LOW (ref >60)
Glucose, Bld: 104 mg/dL — ABNORMAL HIGH (ref 70–99)
Potassium: 4.1 mmol/L (ref 3.5–5.1)
Sodium: 138 mmol/L (ref 135–145)

## 2019-03-06 LAB — CBC
HCT: 34.4 % — ABNORMAL LOW (ref 39.0–52.0)
Hemoglobin: 11.3 g/dL — ABNORMAL LOW (ref 13.0–17.0)
MCH: 32.2 pg (ref 26.0–34.0)
MCHC: 32.8 g/dL (ref 30.0–36.0)
MCV: 98 fL (ref 80.0–100.0)
Platelets: 388 10*3/uL (ref 150–400)
RBC: 3.51 MIL/uL — ABNORMAL LOW (ref 4.22–5.81)
RDW: 12 % (ref 11.5–15.5)
WBC: 26.7 10*3/uL — ABNORMAL HIGH (ref 4.0–10.5)
nRBC: 0 % (ref 0.0–0.2)

## 2019-03-06 LAB — AEROBIC/ANAEROBIC CULTURE W GRAM STAIN (SURGICAL/DEEP WOUND): Special Requests: NORMAL

## 2019-03-06 MED ORDER — CIPROFLOXACIN HCL 500 MG PO TABS
500.0000 mg | ORAL_TABLET | Freq: Two times a day (BID) | ORAL | Status: DC
  Administered 2019-03-06 – 2019-03-07 (×3): 500 mg via ORAL
  Filled 2019-03-06 (×3): qty 1

## 2019-03-06 MED ORDER — METRONIDAZOLE 500 MG PO TABS
500.0000 mg | ORAL_TABLET | Freq: Three times a day (TID) | ORAL | Status: DC
  Administered 2019-03-06 – 2019-03-07 (×4): 500 mg via ORAL
  Filled 2019-03-06 (×4): qty 1

## 2019-03-06 NOTE — Plan of Care (Signed)

## 2019-03-06 NOTE — Progress Notes (Signed)
5 Days Post-Op    CC: Abdominal pain  Subjective: Feels ok.   Objective: Vital signs in last 24 hours: Temp:  [98.6 F (37 C)-98.8 F (37.1 C)] 98.8 F (37.1 C) (08/01 0500) Pulse Rate:  [63-70] 63 (08/01 0500) Resp:  [16-18] 18 (08/01 0500) BP: (153-161)/(89-94) 159/89 (08/01 0500) SpO2:  [94 %-98 %] 94 % (08/01 0500) Last BM Date: 04/03/19  Intake/Output from previous day: 07/31 0701 - 08/01 0700 In: 2987.8 [P.O.:960; I.V.:1579.3; IV Piggyback:448.5] Out: 2750 [Urine:2600; Drains:150] Intake/Output this shift: Total I/O In: -  Out: 730 [Urine:700; Drains:30]  General appearance: alert, cooperative and no distress Resp: clear to auscultation bilaterally GI: Mildly tender mostly around drain.  Drain output bilious  Lab Results:  Recent Labs    03/05/19 0352 03/06/19 0316  WBC 21.0* 26.7*  HGB 11.0* 11.3*  HCT 33.5* 34.4*  PLT 315 388    BMET Recent Labs    03/05/19 0352 03/06/19 0316  NA 137 138  K 4.3 4.1  CL 105 107  CO2 26 25  GLUCOSE 110* 104*  BUN 11 10  CREATININE 1.28* 1.26*  CALCIUM 8.4* 8.5*   PT/INR No results for input(s): LABPROT, INR in the last 72 hours.  Recent Labs  Lab 03/01/19 0254 03/02/19 0845 03/03/19 0334 03/04/19 0115 03/05/19 0352  AST 34 21 21 24 17   ALT 60* 42 38 36 28  ALKPHOS 203* 171* 197* 197* 184*  BILITOT 3.0* 1.3* 1.8* 1.9* 1.2  PROT 5.8* 5.5* 6.1* 6.1* 5.5*  ALBUMIN 2.6* 2.5* 2.6* 2.7* 2.3*     Lipase     Component Value Date/Time   LIPASE 20 03/04/2019 0115     Medications: . buPROPion  300 mg Oral QHS  . enoxaparin (LOVENOX) injection  40 mg Subcutaneous Q24H  . multivitamin with minerals  1 tablet Oral Daily  . pantoprazole  40 mg Oral QHS   Specimen Description BILE   Special Requests Normal   Gram Stain MODERATE WBC PRESENT, PREDOMINANTLY PMN  FEW GRAM VARIABLE ROD   Culture ABUNDANT CITROBACTER BRAAKII  MODERATE KLEBSIELLA OXYTOCA  HOLDING FOR POSSIBLE ANAEROBE  Performed at [facility], [place] [address] [place], [place] [address]   Report Status PENDING   Organism ID, Bacteria CITROBACTER BRAAKII   Organism ID, Bacteria KLEBSIELLA OXYTOCA   Resulting Agency [facility]  Susceptibility   Citrobacter braakii Klebsiella oxytoca    MIC MIC    AMPICILLIN   >=32 RESIST... Resistant    AMPICILLIN/SULBACTAM   8 SENSITIVE  Sensitive    CEFAZOLIN >=64 RESIST... Resistant <=4 SENSITIVE  Sensitive    CEFEPIME <=1 SENSITIVE  Sensitive <=1 SENSITIVE  Sensitive    CEFTAZIDIME <=1 SENSITIVE  Sensitive <=1 SENSITIVE  Sensitive    CEFTRIAXONE <=1 SENSITIVE  Sensitive <=1 SENSITIVE  Sensitive    CIPROFLOXACIN <=0.25 SENS... Sensitive <=0.25 SENS... Sensitive    Extended ESBL   NEGATIVE  Sensitive    GENTAMICIN <=1 SENSITIVE  Sensitive <=1 SENSITIVE  Sensitive    IMIPENEM 0.5 SENSITIVE  Sensitive <=0.25 SENS... Sensitive    PIP/TAZO <=4 SENSITIVE  Sensitive <=4 SENSITIVE  Sensitive    TRIMETH/SULFA <=20 SENSIT... Sensitive <=20 SENSIT... Sensitive      . dextrose 5 % and 0.9% NaCl 125 mL/hr at 03/06/19 0537  . piperacillin-tazobactam (ZOSYN)  IV 3.375 g (03/06/19 0536)   . dextrose 5 % and 0.9% NaCl 125 mL/hr at 03/06/19 0537  . piperacillin-tazobactam (ZOSYN)  IV 3.375 g (03/06/19  0536)    Assessment/Plan Acute kidney injury Chronic lymphocytic leukemia Hypertension Hx of sigmoid colectomy/? LAR for precancerous mass 2002 Hx appendectomy/bilateral inguinal hernia repairs  Acute cholecystitis Choledocholithisis - S/P ERCP stone removal by sphincterotomy and ballon sweep - 7/27 - S/P IR Image Guided Cholecystostomy Tube Placement - 7/28 - WBC continues to trend up, afebrile - Bile cx complete- transition to PO abx  - Mobilize and IS  ODQ:VHQI ID: Zosyn 7/26>> 7/31. Cipro/flagyl PO 8/1--> TUY:WXIPPND Follow up: Dr. Redmond Pulling LOP:RAFOADLK Keidel (Wife) [phone]  WBC up but clinically improving. Change to cipro/ flagyl PO, recheck labs with diff  tomorrow. Cr remains elevated at 1.2, continue ivf. .       LOS: 5 days    Clovis Riley 03/06/2019

## 2019-03-06 NOTE — Plan of Care (Signed)
  Problem: Pain Managment: Goal: General experience of comfort will improve Outcome: Progressing   

## 2019-03-07 LAB — CBC WITH DIFFERENTIAL/PLATELET
Abs Immature Granulocytes: 0.05 10*3/uL (ref 0.00–0.07)
Basophils Absolute: 0.1 10*3/uL (ref 0.0–0.1)
Basophils Relative: 0 %
Eosinophils Absolute: 0.2 10*3/uL (ref 0.0–0.5)
Eosinophils Relative: 1 %
HCT: 33.4 % — ABNORMAL LOW (ref 39.0–52.0)
Hemoglobin: 11.1 g/dL — ABNORMAL LOW (ref 13.0–17.0)
Immature Granulocytes: 0 %
Lymphocytes Relative: 77 %
Lymphs Abs: 17.9 10*3/uL — ABNORMAL HIGH (ref 0.7–4.0)
MCH: 32.6 pg (ref 26.0–34.0)
MCHC: 33.2 g/dL (ref 30.0–36.0)
MCV: 97.9 fL (ref 80.0–100.0)
Monocytes Absolute: 0.6 10*3/uL (ref 0.1–1.0)
Monocytes Relative: 2 %
Neutro Abs: 4.6 10*3/uL (ref 1.7–7.7)
Neutrophils Relative %: 20 %
Platelets: 440 10*3/uL — ABNORMAL HIGH (ref 150–400)
RBC: 3.41 MIL/uL — ABNORMAL LOW (ref 4.22–5.81)
RDW: 12.1 % (ref 11.5–15.5)
WBC: 23.4 10*3/uL — ABNORMAL HIGH (ref 4.0–10.5)
nRBC: 0 % (ref 0.0–0.2)

## 2019-03-07 LAB — MAGNESIUM: Magnesium: 2 mg/dL (ref 1.7–2.4)

## 2019-03-07 LAB — BASIC METABOLIC PANEL
Anion gap: 8 (ref 5–15)
BUN: 9 mg/dL (ref 8–23)
CO2: 23 mmol/L (ref 22–32)
Calcium: 8.5 mg/dL — ABNORMAL LOW (ref 8.9–10.3)
Chloride: 106 mmol/L (ref 98–111)
Creatinine, Ser: 1.11 mg/dL (ref 0.61–1.24)
GFR calc Af Amer: >60 mL/min (ref >60)
GFR calc non Af Amer: >60 mL/min (ref >60)
Glucose, Bld: 99 mg/dL (ref 70–99)
Potassium: 4 mmol/L (ref 3.5–5.1)
Sodium: 137 mmol/L (ref 135–145)

## 2019-03-07 MED ORDER — OXYCODONE HCL 5 MG PO TABS: 5.0000 mg | ORAL_TABLET | ORAL | 0 refills | Status: DC | PRN

## 2019-03-07 MED ORDER — METRONIDAZOLE 500 MG PO TABS: 500.0000 mg | ORAL_TABLET | Freq: Three times a day (TID) | ORAL | 0 refills | Status: AC

## 2019-03-07 MED ORDER — ACETAMINOPHEN 325 MG PO TABS: 650.0000 mg | ORAL_TABLET | Freq: Four times a day (QID) | ORAL | Status: DC | PRN

## 2019-03-07 MED ORDER — CIPROFLOXACIN HCL 500 MG PO TABS: 500.0000 mg | ORAL_TABLET | Freq: Two times a day (BID) | ORAL | 0 refills | Status: AC

## 2019-03-07 NOTE — Discharge Summary (Signed)
Physician Discharge Summary  Patient ID: Jonathon Parker MRN: [id number] DOB/AGE: 09/16/1950 68 y.o.  Admit date: 02/27/2019 Discharge date: 03/07/2019  Admission Diagnoses:  Acute calculus cholecystitis Elevated LFTs Acute kidney injury Hypertension Chronic lymphocytic leukemia   Discharge Diagnoses:  Acute calculus cholecystitis Choledocholithiasis Acute kidney injury Chronic lymphocytic leukemia Hypertension Hx sigmoid colectomy/possible LAR for precancerous mass 2002 Hx of appendectomy and bilateral inguinal hernia repairs  Active Problems:   Calculous cholecystitis   PROCEDURES:  1.  ERCP with stone removal, sphincterotomy and balloon sweep 03/01/2019 Dr. Valarie Merino Mansouraty 2.  Eventual radiology percutaneous cholecystostomy tube placement 03/02/2019, Dr. Quita Skye hand   [facility] Course:  68 year old male with history of hypertension, CLL stage 0 presents to the emergency room for ongoing nausea and poor appetite.  He also has had right upper quadrant pain.  He states his symptoms developed last Sunday after eating a tomato sandwich that tasted funny.  He immediately had multiple episodes of nausea and vomiting.  He also had discomfort from all the vomiting.  He states that he would have waves of pain in the right upper abdomen.  He describes it as a stabbing sensation that would last 10 minutes or so but would repeat quite frequently.  He had some back pain but he attributed that to all the vomiting.  He continued to have episodes of nausea throughout the week.  He only vomited again on Monday.  He developed some fever but he cannot remember the day that he broke out in a fever.  He had poor appetite and is only had 3 peanut butter crackers in the past 3 days.  His wife has been encouraged him to drink liquids.  He finally came to the emergency room when he lost his desire to drink liquids.  No diarrhea.  Last bowel movement was day before yesterday. He was diagnosed with CLL back in  fall 2019.  Stage 0.  No treatment just observation now His surgical history includes what sounds like a sigmoid colectomy or LAR for a precancerous mass in 2002.  He has a remote history of appendectomy.  He then underwent laparoscopic bilateral inguinal hernia repairs in 2016. He denies any chest pain, chest pressure, shortness of breath, TIAs or amaurosis fugax.  He does endorse some dyspnea on exertion over the past several months which he attributes to his CLL diagnosis.  He was admitted underwent MRCP which showed choledocholithiasis.  At that point GI was contacted.  Patient was taken to endoscopy and underwent ERCP on 03/01/2019.  Films were reviewed by Dr. Windle Guard and Dr. Redmond Pulling.  It was their opinion that this would be a difficult case to do a cholecystectomy on at this point.  It would likely require a subtotal cholecystectomy or a fenestrated cholecystectomy with drain placement.  It was finally opted to recommend cholecystostomy tube placement along with antibiotics.  The risk and benefits were discussed with patient in detail and he agreed with the plan.   After completion of the ERCP he was sent to interventional radiology and underwent cholecystostomy tube placement.  He tolerated this well.  He was maintained on IV fluids and IV antibiotics from time of his admission.  His diet was slowly advanced after the cholecystostomy tube placement.  He continued to have significant pain up until 03/05/2019.  His LFTs show good improvement after the ERCP.    Post cholecystostomy tube placement he continued to have high white counts.  Differential on 03/07/2019 showed 20% neutrophils, 77% lymphocytes, 3 monocytes  and 1 eosinophil.  WBC was better but still elevated at 23.4.  We attributed the elevation to his CLL, and not ongoing infection.  Patient was converted to oral antibiotics based on culture results.  And by 03/07/2027 was Dr. Biagio Borg opinion he was ready for discharge.  We plan an additional week  of Cipro/Flagyl.  He will be followed by IR for his cholecystostomy tube, he will be followed by Dr. Redmond Pulling in about 4 weeks.  CBC Latest Ref Rng & Units 03/07/2019 03/06/2019 03/05/2019  WBC 4.0 - 10.5 K/uL 23.4(H) 26.7(H) 21.0(H)  Hemoglobin 13.0 - 17.0 g/dL 11.1(L) 11.3(L) 11.0(L)  Hematocrit 39.0 - 52.0 % 33.4(L) 34.4(L) 33.5(L)  Platelets 150 - 400 K/uL 440(H) 388 315   CMP Latest Ref Rng & Units 03/07/2019 03/06/2019 03/05/2019  Glucose 70 - 99 mg/dL 99 104(H) 110(H)  BUN 8 - 23 mg/dL 9 10 11   Creatinine 0.61 - 1.24 mg/dL 1.11 1.26(H) 1.28(H)  Sodium 135 - 145 mmol/L 137 138 137  Potassium 3.5 - 5.1 mmol/L 4.0 4.1 4.3  Chloride 98 - 111 mmol/L 106 107 105  CO2 22 - 32 mmol/L 23 25 26   Calcium 8.9 - 10.3 mg/dL 8.5(L) 8.5(L) 8.4(L)  Total Protein 6.5 - 8.1 g/dL - - 5.5(L)  Total Bilirubin 0.3 - 1.2 mg/dL - - 1.2  Alkaline Phos 38 - 126 U/L - - 184(H)  AST 15 - 41 U/L - - 17  ALT 0 - 44 U/L - - 28    Specimen Description BILE   Special Requests Normal   Gram Stain MODERATE WBC PRESENT, PREDOMINANTLY PMN  FEW GRAM VARIABLE ROD   Culture ABUNDANT CITROBACTER BRAAKII  MODERATE KLEBSIELLA OXYTOCA  MODERATE BACTEROIDES SPECIES  BETA LACTAMASE POSITIVE  Performed at [facility], [place] [address] [place], [place] [address]   Report Status 03/06/2019 FINAL   Organism ID, Bacteria CITROBACTER BRAAKII   Organism ID, Bacteria KLEBSIELLA OXYTOCA   Resulting Agency [facility]  Susceptibility   Citrobacter braakii Klebsiella oxytoca    MIC MIC    AMPICILLIN   >=32 RESIST... Resistant    AMPICILLIN/SULBACTAM   8 SENSITIVE  Sensitive    CEFAZOLIN >=64 RESIST... Resistant <=4 SENSITIVE  Sensitive    CEFEPIME <=1 SENSITIVE  Sensitive <=1 SENSITIVE  Sensitive    CEFTAZIDIME <=1 SENSITIVE  Sensitive <=1 SENSITIVE  Sensitive    CEFTRIAXONE <=1 SENSITIVE  Sensitive <=1 SENSITIVE  Sensitive    CIPROFLOXACIN <=0.25 SENS... Sensitive <=0.25 SENS... Sensitive    Extended ESBL   NEGATIVE   Sensitive    GENTAMICIN <=1 SENSITIVE  Sensitive <=1 SENSITIVE  Sensitive    IMIPENEM 0.5 SENSITIVE  Sensitive <=0.25 SENS... Sensitive    PIP/TAZO <=4 SENSITIVE  Sensitive <=4 SENSITIVE  Sensitive    TRIMETH/SULFA <=20 SENSIT... Sensitive <=20 SENSIT... Sensitive           Susceptibility Comments  Citrobacter braakii  ABUNDANT CITROBACTER BRAAKII  Klebsiella oxytoca  MODERATE KLEBSIELLA OXYTOCA     MRI 02/28/2019:Mildly distended gallbladder with small stones and diffuse wall thickening, highly suspicious for acute cholecystitis. Mild biliary ductal dilatation, with at least 2 tiny calculi in the distal common bile duct, largest measuring 6 mm. Stable small benign hemangioma in the left hepatic lobe. Small right pleural effusion and mild right basilar atelectasis.  US abdominal Limited 7/26: Gallstones, biliary sludge and tumefactive sludge balls in the gallbladder. Gallbladder is only moderately distended, however, there is wall thickening and a positive Murphy's sign on  examination. Findings are concerning for early acute cholecystitis.   Condition on discharge: Improved   Disposition: Home   Allergies as of 03/07/2019   No Known Allergies     Medication List    STOP taking these medications   HYDROcodone-acetaminophen 5-325 MG tablet Commonly known as: NORCO/VICODIN   ibuprofen 200 MG tablet Commonly known as: ADVIL     TAKE these medications   acetaminophen 325 MG tablet Commonly known as: TYLENOL Take 2 tablets (650 mg total) by mouth every 6 (six) hours as needed for mild pain (or temp > 100).   ALPRAZolam 0.25 MG tablet Commonly known as: XANAX Take 0.25 mg by mouth at bedtime as needed for anxiety.   buPROPion 300 MG 24 hr tablet Commonly known as: WELLBUTRIN XL Take 300 mg by mouth at bedtime.   ciprofloxacin 500 MG tablet Commonly known as: CIPRO Take 1 tablet (500 mg total) by mouth 2 (two) times daily for 7 days.   esomeprazole 40 MG  capsule Commonly known as: NEXIUM Take 40 mg by mouth daily.   lisinopril 20 MG tablet Commonly known as: ZESTRIL Take 20 mg by mouth daily.   metroNIDAZOLE 500 MG tablet Commonly known as: FLAGYL Take 1 tablet (500 mg total) by mouth every 8 (eight) hours for 7 days.   ondansetron 4 MG disintegrating tablet Commonly known as: Zofran ODT Take 1 tablet (4 mg total) by mouth every 8 (eight) hours as needed for nausea.   oxyCODONE 5 MG immediate release tablet Commonly known as: Oxy IR/ROXICODONE Take 1 tablet (5 mg total) by mouth every 4 (four) hours as needed for severe pain or breakthrough pain.   saccharomyces boulardii 250 MG capsule Commonly known as: FLORASTOR Take 1 capsule (250 mg total) by mouth 2 (two) times daily.   traZODone 100 MG tablet Commonly known as: DESYREL Take 100 mg by mouth at bedtime.      Follow-up Information    Greer Pickerel, MD Follow up.   Specialty: General Surgery Why: call for follow up appointment in 4 weeks Contact information: [address] [place] [address] [phone]        [facility] Follow up.   Why: They should call you and set you up with a follow up CT scan. Contact information: [place] [address] [phone]        Nicoletta Dress, MD Follow up.   Specialty: Internal Medicine Why: [facility] and let them know about your hospitalization, along with your Oncologist.  follow up with each of them as directed. Contact information: [place] [address] [phone]           Signed: Earnstine Regal 03/07/2019, 11:26 AM

## 2019-03-07 NOTE — Progress Notes (Signed)
Patient discharged to home with wife at his side. Drain care and dressing change teaching performed with patient and wife at bedside. Verbalized understanding of all discharge instructions including drain care, recording output, discharge medications and follow up MD visits. Patient left unit with wife via wheelchair.

## 2019-03-07 NOTE — Discharge Instructions (Signed)
Cholecystostomy, Care After This sheet gives you information about how to care for yourself after your procedure. Your health care provider may also give you more specific instructions. If you have problems or questions, contact your health care provider. What can I expect after the procedure? After your procedure, it is common to have soreness near the incision site of your drainage tube (catheter). Follow these instructions at home: Incision care   Follow instructions from your health care provider about how to take care of your incision site where the catheter was inserted. Make sure you: ? Wash your hands with soap and water before and after you change your bandage (dressing). If soap and water are not available, use hand sanitizer. ? Change your dressing as told by your health care provider.  Check the incision site every day for signs of infection. Check for: ? Redness, swelling, or pain. ? Fluid or blood. ? Warmth. ? Pus or a bad smell.  Do not take baths, swim, or use a hot tub until your health care provider approves. Ask your health care provider if you may take showers. You may only be allowed to take sponge baths. General instructions  Follow instructions from your health care provider about how to care for your catheter and collection bag at home.  Your health care provider will show you: ? How to record the amount of drainage from the catheter. ? How to flush the catheter. ? How to care for the catheter incision site.  Follow instructions from your health care provider about eating or drinking restrictions.  Take over-the-counter and prescription medicines only as told by your health care provider.  Keep all follow-up visits as told by your health care provider. This is important. Contact a health care provider if:  You have redness, swelling, or pain around the catheter incision site.  You have nausea or vomiting. Get help right away if:  Your abdominal pain  gets worse.  You feel dizzy or you faint while standing.  You have fluid or blood coming from the catheter incision site.  The area around the catheter incision site feels warm to the touch.  You have pus or a bad smell coming from the catheter incision site.  You have a fever.  You have shortness of breath.  You have a rapid heartbeat.  Your nausea or vomiting does not go away.  Your catheter becomes blocked.  Your catheter comes out of your abdomen. Summary  After your procedure, it is common to have soreness near the incision site of your drainage tube (catheter).  Wash your hands with soap and water before and after you change your bandage (dressing). Change your dressing as told by your health care provider.  Check the catheter incision site every day for signs of infection. Check for redness, swelling, pain, fluid, blood, warmth, pus, or a bad smell.  Contact your health care provider if you have nausea or vomiting, or if you have redness, swelling, or pain around your catheter incision site.  Get help right away if your abdominal pain gets worse, you feel dizzy, you have blood or fluid coming from the catheter incision site, you have a fever, or you have shortness of breath. This information is not intended to replace advice given to you by your health care provider. Make sure you discuss any questions you have with your health care provider. Document Released: 04/12/2015 Document Revised: 02/16/2018 Document Reviewed: 02/16/2018 Elsevier Patient Education  Advance.

## 2019-03-07 NOTE — Care Management (Signed)
Patient to be discharged home with IR drain.  Instructions and patient teaching will be done by IR.  RN feels that patient will be able to care for drain himself.  RN will notify me if patient needs HHRN to help manage drain.

## 2019-03-07 NOTE — Progress Notes (Signed)
6 Days Post-Op    CC: Abdominal pain  Subjective: He looks pretty good this a.m. fairly comfortable still sore right upper quadrant around the drain.  Objective: Vital signs in last 24 hours: Temp:  [98.5 F (36.9 C)-99.3 F (37.4 C)] 98.5 F (36.9 C) (08/02 0619) Pulse Rate:  [59-63] 59 (08/02 0619) Resp:  [18] 18 (08/02 0619) BP: (153-164)/(83-90) 163/83 (08/02 0619) SpO2:  [93 %-96 %] 93 % (08/02 0619) Last BM Date: 04/03/19 360 p.o. 3100 IV 3150 urine 110 drain T-max 99.3 blood pressure still slightly elevated. Creatinine down to 1.11 WBC 23.4 H&H 11/33 Differential shows 20% segs 77% lymphocytes 1 Eos, 2 monos Intake/Output from previous day: 08/01 0701 - 08/02 0700 In: 3495.4 [P.O.:360; I.V.:3135.4] Out: 5053 [Urine:3150; Drains:110] Intake/Output this shift: Total I/O In: -  Out: 380 [Urine:280; Drains:100]  General appearance: alert, cooperative and no distress Resp: clear to auscultation bilaterally GI: Soft, still kind of tender on the right side.  Tolerating p.o.'s well.  Drains working well.  Lab Results:  Recent Labs    03/06/19 0316 03/07/19 0220  WBC 26.7* 23.4*  HGB 11.3* 11.1*  HCT 34.4* 33.4*  PLT 388 440*    BMET Recent Labs    03/06/19 0316 03/07/19 0220  NA 138 137  K 4.1 4.0  CL 107 106  CO2 25 23  GLUCOSE 104* 99  BUN 10 9  CREATININE 1.26* 1.11  CALCIUM 8.5* 8.5*   PT/INR No results for input(s): LABPROT, INR in the last 72 hours.  Recent Labs  Lab 03/01/19 0254 03/02/19 0845 03/03/19 0334 03/04/19 0115 03/05/19 0352  AST 34 21 21 24 17   ALT 60* 42 38 36 28  ALKPHOS 203* 171* 197* 197* 184*  BILITOT 3.0* 1.3* 1.8* 1.9* 1.2  PROT 5.8* 5.5* 6.1* 6.1* 5.5*  ALBUMIN 2.6* 2.5* 2.6* 2.7* 2.3*     Lipase     Component Value Date/Time   LIPASE 20 03/04/2019 0115     Medications: . buPROPion  300 mg Oral QHS  . ciprofloxacin  500 mg Oral BID  . enoxaparin (LOVENOX) injection  40 mg Subcutaneous Q24H  .  metroNIDAZOLE  500 mg Oral Q8H  . multivitamin with minerals  1 tablet Oral Daily  . pantoprazole  40 mg Oral QHS    Assessment/Plan Acute kidney injury Chronic lymphocytic leukemia Hypertension Hx of sigmoid colectomy/? LAR for precancerous mass 2002 Hx appendectomy/bilateral inguinal hernia repairs  Acute cholecystitis Choledocholithisis - S/P ERCP stone removal by sphincterotomy and ballon sweep - 7/27 - S/P IR Image Guided Cholecystostomy Tube Placement - 7/28 - WBC continues to trend up, afebrile - Bile cx complete- transition to PO abx  - Mobilize and IS  ZJQ:BHAL ID: Zosyn 7/26>>7/31. Cipro/flagyl PO 8/1--> PFX:[id number] Follow up: Dr. Redmond Pulling [id number] Lupa (Wife) [phone]  Plan: I think he can probably go home today.  We will tentatively plan to send him home on another week of antibiotics.  I will review with Dr. Windle Guard.  It does not look like he is getting flushes for his drain.  I will check on that.  We will set him up for follow-up with Dr. Redmond Pulling in 4 weeks.  IR will follow also.      LOS: 6 days    Jonathon Parker 03/07/2019 [id number]

## 2019-03-08 ENCOUNTER — Other Ambulatory Visit: Payer: Self-pay | Admitting: General Surgery

## 2019-03-08 DIAGNOSIS — K8001 Calculus of gallbladder with acute cholecystitis with obstruction: Secondary | ICD-10-CM

## 2019-03-08 LAB — PATHOLOGIST SMEAR REVIEW

## 2019-03-09 NOTE — Telephone Encounter (Signed)
Thank you all for taking care of him. We will follow-up  RG

## 2019-03-10 ENCOUNTER — Telehealth: Payer: Self-pay | Admitting: Gastroenterology

## 2019-03-10 NOTE — Telephone Encounter (Signed)
Pt was seen in the hospital 03/01/19 by Dr. Rush Landmark where gallstones were removed.  Pt was instructed to follow up with Dr. Lyndel Safe and have a repeat liver enzyme test.  Pt would like more information on liver enzyme and whether to do it before or after OV scheduled 9/3 with Dr. Lyndel Safe.

## 2019-03-10 NOTE — Telephone Encounter (Signed)
Patient informed that lab work will need to be obtained prior to the OV with Dr. Lyndel Safe; patient verbalized understanding of information; patient advised to call back to the office if questions/concerns arise;

## 2019-03-17 ENCOUNTER — Telehealth: Payer: Self-pay

## 2019-03-17 NOTE — Telephone Encounter (Signed)
-----   Message from Mohammed Kindle, RN sent at 03/03/2019 10:49 AM EDT ----- Regarding: repeat lab Please call the patient and have them go to [facility] for repeat hepatic function panel -elevated LFT's anytime after 03/17/2019

## 2019-03-17 NOTE — Telephone Encounter (Signed)
Please see additional documentation concerning this patient as he had already been informed of needing lab work to be done-

## 2019-03-17 NOTE — Telephone Encounter (Signed)
Left message for patient to call back to be advised of lab work requested by MD;

## 2019-03-23 ENCOUNTER — Other Ambulatory Visit (HOSPITAL_COMMUNITY): Payer: Self-pay | Admitting: Diagnostic Radiology

## 2019-03-23 DIAGNOSIS — K8001 Calculus of gallbladder with acute cholecystitis with obstruction: Secondary | ICD-10-CM

## 2019-03-25 ENCOUNTER — Ambulatory Visit (HOSPITAL_COMMUNITY)
Admission: RE | Admit: 2019-03-25 | Discharge: 2019-03-25 | Disposition: A | Discharge status: Home / Self Care | Payer: Medicare Other | Source: Ambulatory Visit | Attending: Diagnostic Radiology | Admitting: Diagnostic Radiology

## 2019-03-25 ENCOUNTER — Other Ambulatory Visit (HOSPITAL_COMMUNITY): Payer: Self-pay | Admitting: Diagnostic Radiology

## 2019-03-25 ENCOUNTER — Other Ambulatory Visit: Payer: Self-pay

## 2019-03-25 ENCOUNTER — Encounter (HOSPITAL_COMMUNITY): Payer: Self-pay | Admitting: Diagnostic Radiology

## 2019-03-25 DIAGNOSIS — K8001 Calculus of gallbladder with acute cholecystitis with obstruction: Secondary | ICD-10-CM

## 2019-03-25 DIAGNOSIS — Z438 Encounter for attention to other artificial openings: Secondary | ICD-10-CM | POA: Insufficient documentation

## 2019-03-25 HISTORY — PX: IR CHOLANGIOGRAM EXISTING TUBE: IMG6040

## 2019-03-25 MED ORDER — LIDOCAINE HCL 1 % IJ SOLN
INTRAMUSCULAR | Status: AC
  Filled 2019-03-25: qty 20

## 2019-03-25 MED ORDER — IOHEXOL 300 MG/ML  SOLN
50.0000 mL | Freq: Once | INTRAMUSCULAR | Status: AC | PRN
  Administered 2019-03-25: 5 mL via INTRAVENOUS

## 2019-03-30 ENCOUNTER — Ambulatory Visit: Payer: Medicare Other | Admitting: Gastroenterology

## 2019-04-01 ENCOUNTER — Other Ambulatory Visit (INDEPENDENT_AMBULATORY_CARE_PROVIDER_SITE_OTHER): Payer: Medicare Other

## 2019-04-01 DIAGNOSIS — R7989 Other specified abnormal findings of blood chemistry: Secondary | ICD-10-CM

## 2019-04-01 DIAGNOSIS — R945 Abnormal results of liver function studies: Secondary | ICD-10-CM | POA: Diagnosis not present

## 2019-04-01 LAB — HEPATIC FUNCTION PANEL
ALT: 13 U/L (ref 0–53)
AST: 18 U/L (ref 0–37)
Albumin: 4.1 g/dL (ref 3.5–5.2)
Alkaline Phosphatase: 73 U/L (ref 39–117)
Bilirubin, Direct: 0.1 mg/dL (ref 0.0–0.3)
Total Bilirubin: 0.5 mg/dL (ref 0.2–1.2)
Total Protein: 6.5 g/dL (ref 6.0–8.3)

## 2019-04-03 ENCOUNTER — Ambulatory Visit: Payer: Self-pay | Admitting: General Surgery

## 2019-04-03 NOTE — H&P (View-Only) (Signed)
Virgina Norfolk Documented: 04/01/2019 10:33 AM Location: [facility] Patient #: [id number] DOB: August 03, 1951 Married / Language: [name] / Race: White Male  History of Present Illness [name] M. Valbona Slabach MD; 04/03/2019 9:06 AM) The patient is a 68 year old male who presents for evaluation of gall stones. He comes in for follow-up after being in the hospital from July 26 through August 2 for acute calculus cholecystitis and acute kidney injury and choledocholithiasis. His symptoms have been persistent for quite some time and given the degree of gallbladder inflammation on imaging he underwent cholecystostomy tube placement in lieu of immediate gallbladder surgery. He underwent ERCP and sphincterotomy on July 27 to clear his choledocholithiasis. He underwent cholecystostomy tube exchange on August 20. During that study he was found to have gallstones and a patent cystic duct and biliary tree. His gallbladder drain was left to gravity drainage. He is accompanied by his wife. He states that he is ready to get his gallbladder out. He is still having some fatigue but his appetite is improving. He just has some discomfort around the drain itself. He denies any fever, chills, nausea or vomiting. He reports good bowel movements. He reports normal urination. He has not seen his primary care doctor since discharge.  He denies any chest pain, chest pressure, source of breath, dyspnea on exertion   Problem List/Past Medical [name] M. 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 Redmond Pulling, MD; 04/01/2019 11:59 AM) Appendectomy Colon Polyp Removal - Colonoscopy Colon Removal - Partial Foot Surgery Left. Hemorrhoidectomy Open Inguinal Hernia Surgery Bilateral. Oral Surgery Resection of Small Bowel Vasectomy  Diagnostic Studies History [name] M. Redmond Pulling, MD; 04/01/2019 11:59 AM) Colonoscopy 1-5  years ago  Allergies (Tanisha A. Owens Shark, [place]; 04/01/2019 10:34 AM) No Known Drug Allergies [04/01/2019]: Allergies Reconciled  Medication History (Tanisha A. Owens Shark, RMA; 04/01/2019 10:35 AM) Lisinopril (20MG  Tablet, Oral) Active. traZODone HCl (100MG  Tablet, Oral) Active. buPROPion HCl ER (XL) (300MG  Tablet ER 24HR, Oral) Active. NexIUM (40MG  Capsule DR, Oral) Active. Medications Reconciled  Social History [name] M. Redmond Pulling, MD; 04/01/2019 11:59 AM) Alcohol use Occasional alcohol use. Caffeine use Coffee. No drug use Tobacco use Never smoker.  Family History [name] M. Redmond Pulling, MD; 04/01/2019 11:59 AM) Alcohol Abuse Brother. Prostate Cancer Brother. Respiratory Condition Father.  Other Problems [name] M. Redmond Pulling, MD; 04/03/2019 9:08 AM) Cancer Cholelithiasis Depression Hemorrhoids High blood pressure Inguinal Hernia Migraine Headache Other disease, cancer, significant illness     Review of Systems [name] M. Cele Mote MD; 04/01/2019 11:59 AM) General Present- Fatigue and Night Sweats. Not Present- Appetite Loss, Chills, Fever, Weight Gain and Weight Loss. Skin Not Present- Change in Wart/Mole, Dryness, Hives, Jaundice, New Lesions, Non-Healing Wounds, Rash and Ulcer. HEENT Present- Seasonal Allergies and Wears glasses/contact lenses. Not Present- Earache, Hearing Loss, Hoarseness, Nose Bleed, Oral Ulcers, Ringing in the Ears, Sinus Pain, Sore Throat, Visual Disturbances and Yellow Eyes. Respiratory Not Present- Bloody sputum, Chronic Cough, Difficulty Breathing, Snoring and Wheezing. Breast Not Present- Breast Mass, Breast Pain, Nipple Discharge and Skin Changes. Cardiovascular Present- Shortness of Breath. Not Present- Chest Pain, Difficulty Breathing Lying Down, Leg Cramps, Palpitations, Rapid Heart Rate and Swelling of Extremities. Gastrointestinal Not Present- Abdominal Pain, Bloating, Bloody Stool, Change in Bowel Habits, Chronic diarrhea, Constipation, Difficulty  Swallowing, Excessive gas, Gets full quickly at meals, Hemorrhoids, Indigestion, Nausea, Rectal Pain and Vomiting. Male Genitourinary Not Present- Blood in Urine, Change in Urinary Stream, Frequency, Impotence, Nocturia, Painful Urination, Urgency  and Urine Leakage. Musculoskeletal Not Present- Back Pain, Joint Pain, Joint Stiffness, Muscle Pain, Muscle Weakness and Swelling of Extremities. Neurological Present- Decreased Memory. Not Present- Fainting, Headaches, Numbness, Seizures, Tingling, Tremor, Trouble walking and Weakness. Psychiatric Present- Depression. Not Present- Anxiety, Bipolar, Change in Sleep Pattern, Fearful and Frequent crying. Endocrine Not Present- Cold Intolerance, Excessive Hunger, Hair Changes, Heat Intolerance, Hot flashes and New Diabetes. Hematology Not Present- Blood Thinners, Easy Bruising, Excessive bleeding, Gland problems, HIV and Persistent Infections.  Vitals (Tanisha A. Brown RMA; 04/01/2019 10:34 AM) 04/01/2019 10:33 AM Weight: 192.8 lb Height: 72in Body Surface Area: 2.1 m Body Mass Index: 26.15 kg/m  Temp.: 97.83F  Pulse: 95 (Regular)  BP: 132/82 (Sitting, Left Arm, Standard)        Physical Exam [name] M. Kailei Cowens MD; 04/01/2019 11:59 AM)  General Mental Status-Alert. General Appearance-Consistent with stated age. Hydration-Well hydrated. Voice-Normal.  Head and Neck Head-normocephalic, atraumatic with no lesions or palpable masses. Trachea-midline. Thyroid Gland Characteristics - normal size and consistency.  Eye Eyeball - Bilateral-Extraocular movements intact. Sclera/Conjunctiva - Bilateral-No scleral icterus.  ENMT Ears Pinna - Bilateral - no bony growth in lateral aspect of ear canal, no drainage observed. Nose and Sinuses External Inspection of the Nose - symmetric, no deformities observed.  Chest and Lung Exam Chest and lung exam reveals -quiet, even and easy respiratory effort with no use of  accessory muscles and on auscultation, normal breath sounds, no adventitious sounds and normal vocal resonance. Inspection Chest Wall - Normal. Back - normal.  Breast - Did not examine.  Cardiovascular Cardiovascular examination reveals -normal heart sounds, regular rate and rhythm with no murmurs and normal pedal pulses bilaterally.  Abdomen Inspection Inspection of the abdomen reveals - No Hernias. Skin - Scar - Note: lower midline. Palpation/Percussion Palpation and Percussion of the abdomen reveal - Soft, Non Tender, No Rebound tenderness, No Rigidity (guarding) and No hepatosplenomegaly. Auscultation Auscultation of the abdomen reveals - Bowel sounds normal. Note: GB drain in RUQ. intact  Peripheral Vascular Upper Extremity Palpation - Pulses bilaterally normal.  Neurologic Neurologic evaluation reveals -alert and oriented x 3 with no impairment of recent or remote memory. Mental Status-Normal.  Neuropsychiatric The patient's mood and affect are described as -normal. Judgment and Insight-insight is appropriate concerning matters relevant to self.  Musculoskeletal Normal Exam - Left-Upper Extremity Strength Normal and Lower Extremity Strength Normal. Normal Exam - Right-Upper Extremity Strength Normal and Lower Extremity Strength Normal.  Lymphatic Head & Neck  General Head & Neck Lymphatics: Bilateral - Description - Normal. Axillary - Did not examine. Femoral & Inguinal - Did not examine.    Assessment & Plan [name] M. 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 The patient was given Neurosurgeon. We discussed non-operative and operative management. We discussed the signs & symptoms of acute cholecystitis. We discussed removing his gallbladder drained since his cholangiogram study reveals a patent cystic duct and not operating. We discussed that in my opinion he  would be at moderate to high risk for recurrent symptomatic cholelithiasis if not cholecystitis. So therefore formally recommended proceeding with interval cholecystectomy  I discussed laparoscopic cholecystectomy with IOC in detail. The patient was given educational material as well as diagrams detailing the procedure. We discussed the risks and benefits of a laparoscopic cholecystectomy including, but not limited to bleeding, infection, injury to surrounding structures such as the intestine or liver, bile leak, retained gallstones, need to convert to an open procedure, prolonged  diarrhea, blood clots such as DVT, common bile duct injury, anesthesia risks, and possible need for additional procedures. We discussed the typical post-operative recovery course. I explained that the likelihood of improvement of their symptoms is good.  We did discuss that even with decompression of the gallbladder via the cholecystostomy tube his surgery still may be technically challenging due to chronic inflammation and that he is still at slightly increased risk for bile leak, conversion to open, subtotal cholecystectomy as well as injury to common bile duct.  The patient has elected to proceed with surgery.  Current Plans Pt Education - Pamphlet Given - Laparoscopic Gallbladder Surgery: discussed with patient and provided information. You are being scheduled for surgery- Our schedulers will call you.  You should hear from our office's scheduling department within 5 working days about the location, date, and time of surgery. We try to make accommodations for patient's preferences in scheduling surgery, but sometimes the OR schedule or the surgeon's schedule prevents [place] from making those accommodations.  If you have not heard from our office [phone]) in 5 working days, call the office and ask for your surgeon's nurse.  If you have other questions about your diagnosis, plan, or surgery, call the office and ask  for your surgeon's nurse.   CHOLECYSTOSTOMY CARE (Z43.4)   ESSENTIAL HYPERTENSION (I10)  Leighton Ruff. Redmond Pulling, MD, FACS General, Bariatric, & Minimally Invasive Surgery [facility], [place]

## 2019-04-03 NOTE — H&P (Signed)
Jonathon Parker Documented: 04/01/2019 10:33 AM Location: [facility] Patient #: [id number] DOB: September 29, 1950 Married / Language: Jonathon Parker / Race: White Male  History of Present Illness Jonathon Hiss M. Montoya Watkin MD; 04/03/2019 9:06 AM) The patient is a 68 year old male who presents for evaluation of gall stones. He comes in for follow-up after being in the hospital from July 26 through August 2 for acute calculus cholecystitis and acute kidney injury and choledocholithiasis. His symptoms have been persistent for quite some time and given the degree of gallbladder inflammation on imaging he underwent cholecystostomy tube placement in lieu of immediate gallbladder surgery. He underwent ERCP and sphincterotomy on July 27 to clear his choledocholithiasis. He underwent cholecystostomy tube exchange on August 20. During that study he was found to have gallstones and a patent cystic duct and biliary tree. His gallbladder drain was left to gravity drainage. He is accompanied by his wife. He states that he is ready to get his gallbladder out. He is still having some fatigue but his appetite is improving. He just has some discomfort around the drain itself. He denies any fever, chills, nausea or vomiting. He reports good bowel movements. He reports normal urination. He has not seen his primary care doctor since discharge.  He denies any chest pain, chest pressure, source of breath, dyspnea on exertion   Problem List/Past Medical Jonathon Hiss M. Redmond Pulling, MD; 04/03/2019 9:08 AM) Veatrice Bourbon CARE (Z43.4) CHRONIC CHOLECYSTITIS WITH CALCULUS (K80.10)  Past Surgical History Jonathon Hiss M. Redmond Pulling, MD; 04/01/2019 11:59 AM) Appendectomy Colon Polyp Removal - Colonoscopy Colon Removal - Partial Foot Surgery Left. Hemorrhoidectomy Open Inguinal Hernia Surgery Bilateral. Oral Surgery Resection of Small Bowel Vasectomy  Diagnostic Studies History Jonathon Hiss M. Redmond Pulling, MD; 04/01/2019 11:59 AM) Colonoscopy 1-5  years ago  Allergies (Jonathon A. Owens Shark, [place]; 04/01/2019 10:34 AM) No Known Drug Allergies [04/01/2019]: Allergies Reconciled  Medication History (Jonathon A. Owens Shark, RMA; 04/01/2019 10:35 AM) Lisinopril (20MG  Tablet, Oral) Active. traZODone HCl (100MG  Tablet, Oral) Active. buPROPion HCl ER (XL) (300MG  Tablet ER 24HR, Oral) Active. NexIUM (40MG  Capsule DR, Oral) Active. Medications Reconciled  Social History Jonathon Hiss M. Redmond Pulling, MD; 04/01/2019 11:59 AM) Alcohol use Occasional alcohol use. Caffeine use Coffee. No drug use Tobacco use Never smoker.  Family History Jonathon Hiss M. Redmond Pulling, MD; 04/01/2019 11:59 AM) Alcohol Abuse Brother. Prostate Cancer Brother. Respiratory Condition Father.  Other Problems Jonathon Hiss M. Redmond Pulling, MD; 04/03/2019 9:08 AM) Cancer Cholelithiasis Depression Hemorrhoids High blood pressure Inguinal Hernia Migraine Headache Other disease, cancer, significant illness     Review of Systems Jonathon Hiss M. Gianni Mihalik MD; 04/01/2019 11:59 AM) General Present- Fatigue and Night Sweats. Not Present- Appetite Loss, Chills, Fever, Weight Gain and Weight Loss. Skin Not Present- Change in Wart/Mole, Dryness, Hives, Jaundice, New Lesions, Non-Healing Wounds, Rash and Ulcer. HEENT Present- Seasonal Allergies and Wears glasses/contact lenses. Not Present- Earache, Hearing Loss, Hoarseness, Nose Bleed, Oral Ulcers, Ringing in the Ears, Sinus Pain, Sore Throat, Visual Disturbances and Yellow Eyes. Respiratory Not Present- Bloody sputum, Chronic Cough, Difficulty Breathing, Snoring and Wheezing. Breast Not Present- Breast Mass, Breast Pain, Nipple Discharge and Skin Changes. Cardiovascular Present- Shortness of Breath. Not Present- Chest Pain, Difficulty Breathing Lying Down, Leg Cramps, Palpitations, Rapid Heart Rate and Swelling of Extremities. Gastrointestinal Not Present- Abdominal Pain, Bloating, Bloody Stool, Change in Bowel Habits, Chronic diarrhea, Constipation, Difficulty  Swallowing, Excessive gas, Gets full quickly at meals, Hemorrhoids, Indigestion, Nausea, Rectal Pain and Vomiting. Male Genitourinary Not Present- Blood in Urine, Change in Urinary Stream, Frequency, Impotence, Nocturia, Painful Urination, Urgency  and Urine Leakage. Musculoskeletal Not Present- Back Pain, Joint Pain, Joint Stiffness, Muscle Pain, Muscle Weakness and Swelling of Extremities. Neurological Present- Decreased Memory. Not Present- Fainting, Headaches, Numbness, Seizures, Tingling, Tremor, Trouble walking and Weakness. Psychiatric Present- Depression. Not Present- Anxiety, Bipolar, Change in Sleep Pattern, Fearful and Frequent crying. Endocrine Not Present- Cold Intolerance, Excessive Hunger, Hair Changes, Heat Intolerance, Hot flashes and New Diabetes. Hematology Not Present- Blood Thinners, Easy Bruising, Excessive bleeding, Gland problems, HIV and Persistent Infections.  Vitals (Jonathon Parker RMA; 04/01/2019 10:34 AM) 04/01/2019 10:33 AM Weight: 192.8 lb Height: 72in Body Surface Area: 2.1 m Body Mass Index: 26.15 kg/m  Temp.: 97.33F  Pulse: 95 (Regular)  BP: 132/82 (Sitting, Left Arm, Standard)        Physical Exam Jonathon Hiss M. Ardyce Heyer MD; 04/01/2019 11:59 AM)  General Mental Status-Alert. General Appearance-Consistent with stated age. Hydration-Well hydrated. Voice-Normal.  Head and Neck Head-normocephalic, atraumatic with no lesions or palpable masses. Trachea-midline. Thyroid Gland Characteristics - normal size and consistency.  Eye Eyeball - Bilateral-Extraocular movements intact. Sclera/Conjunctiva - Bilateral-No scleral icterus.  ENMT Ears Pinna - Bilateral - no bony growth in lateral aspect of ear canal, no drainage observed. Nose and Sinuses External Inspection of the Nose - symmetric, no deformities observed.  Chest and Lung Exam Chest and lung exam reveals -quiet, even and easy respiratory effort with no use of  accessory muscles and on auscultation, normal breath sounds, no adventitious sounds and normal vocal resonance. Inspection Chest Wall - Normal. Back - normal.  Breast - Did not examine.  Cardiovascular Cardiovascular examination reveals -normal heart sounds, regular rate and rhythm with no murmurs and normal pedal pulses bilaterally.  Abdomen Inspection Inspection of the abdomen reveals - No Hernias. Skin - Scar - Note: lower midline. Palpation/Percussion Palpation and Percussion of the abdomen reveal - Soft, Non Tender, No Rebound tenderness, No Rigidity (guarding) and No hepatosplenomegaly. Auscultation Auscultation of the abdomen reveals - Bowel sounds normal. Note: GB drain in RUQ. intact  Peripheral Vascular Upper Extremity Palpation - Pulses bilaterally normal.  Neurologic Neurologic evaluation reveals -alert and oriented x 3 with no impairment of recent or remote memory. Mental Status-Normal.  Neuropsychiatric The patient's mood and affect are described as -normal. Judgment and Insight-insight is appropriate concerning matters relevant to self.  Musculoskeletal Normal Exam - Left-Upper Extremity Strength Normal and Lower Extremity Strength Normal. Normal Exam - Right-Upper Extremity Strength Normal and Lower Extremity Strength Normal.  Lymphatic Head & Neck  General Head & Neck Lymphatics: Bilateral - Description - Normal. Axillary - Did not examine. Femoral & Inguinal - Did not examine.    Assessment & Plan Jonathon Hiss M. Shayona Hibbitts MD; 04/03/2019 9:09 AM)  CHRONIC CHOLECYSTITIS WITH CALCULUS (K80.10) Impression: We discussed gallbladder disease. The patient was given Neurosurgeon. We discussed non-operative and operative management. We discussed the signs & symptoms of acute cholecystitis. We discussed removing his gallbladder drained since his cholangiogram study reveals a patent cystic duct and not operating. We discussed that in my opinion he  would be at moderate to high risk for recurrent symptomatic cholelithiasis if not cholecystitis. So therefore formally recommended proceeding with interval cholecystectomy  I discussed laparoscopic cholecystectomy with IOC in detail. The patient was given educational material as well as diagrams detailing the procedure. We discussed the risks and benefits of a laparoscopic cholecystectomy including, but not limited to bleeding, infection, injury to surrounding structures such as the intestine or liver, bile leak, retained gallstones, need to convert to an open procedure, prolonged  diarrhea, blood clots such as DVT, common bile duct injury, anesthesia risks, and possible need for additional procedures. We discussed the typical post-operative recovery course. I explained that the likelihood of improvement of their symptoms is good.  We did discuss that even with decompression of the gallbladder via the cholecystostomy tube his surgery still may be technically challenging due to chronic inflammation and that he is still at slightly increased risk for bile leak, conversion to open, subtotal cholecystectomy as well as injury to common bile duct.  The patient has elected to proceed with surgery.  Current Plans Pt Education - Pamphlet Given - Laparoscopic Gallbladder Surgery: discussed with patient and provided information. You are being scheduled for surgery- Our schedulers will call you.  You should hear from our office's scheduling department within 5 working days about the location, date, and time of surgery. We try to make accommodations for patient's preferences in scheduling surgery, but sometimes the OR schedule or the surgeon's schedule prevents [place] from making those accommodations.  If you have not heard from our office [phone]) in 5 working days, call the office and ask for your surgeon's nurse.  If you have other questions about your diagnosis, plan, or surgery, call the office and ask  for your surgeon's nurse.   CHOLECYSTOSTOMY CARE (Z43.4)   ESSENTIAL HYPERTENSION (I10)  Leighton Ruff. Redmond Pulling, MD, FACS General, Bariatric, & Minimally Invasive Surgery [facility], [place]

## 2019-04-08 ENCOUNTER — Other Ambulatory Visit: Payer: Self-pay

## 2019-04-08 ENCOUNTER — Ambulatory Visit: Payer: Medicare Other | Admitting: Gastroenterology

## 2019-04-08 ENCOUNTER — Encounter: Payer: Self-pay | Admitting: Gastroenterology

## 2019-04-08 VITALS — BP 114/72 | Temp 98.5°F | Ht 72.0 in | Wt 189.4 lb

## 2019-04-08 DIAGNOSIS — K8001 Calculus of gallbladder with acute cholecystitis with obstruction: Secondary | ICD-10-CM | POA: Diagnosis not present

## 2019-04-08 NOTE — Patient Instructions (Signed)
To help prevent the possible spread of infection to our patients, communities, and staff; we will be implementing the following measures:  As of now we are not allowing any visitors/family members to accompany you to any upcoming appointments with [facility]. If you have any concerns about this please contact our office to discuss prior to the appointment.   To help prevent the possible spread of infection to our patients, communities, and staff; we will be implementing the following measures:  As of now we are not allowing any visitors/family members to accompany you to any upcoming appointments with [facility]. If you have any concerns about this please contact our office to discuss prior to the appointment.   Please call our office at [phone] to set up your 6 month follow up visit.  Thank you,  Dr. Jackquline Denmark

## 2019-04-08 NOTE — Progress Notes (Signed)
Chief Complaint:   Referring Provider:  Nicoletta Dress, MD      ASSESSMENT AND PLAN;   #1.  Acute cholecystitis s/p cholecystostomy tube.  Currently scheduled for lap chole May 20, 2019. Tube study neg except for gallstones 03/25/2019.  #2.  Choledocholithiasis status post ERCP with biliary sphincterotomy and stone extraction (Dr Kathrynn Running) 03/01/2019  Plan: - Proceed with lap chole - All cleared from GI standpoint. - RTC 6 months   HPI:    Jonathon Parker is a 68 y.o. male  For follow-up visit Had normal LFTs. No fever or chills. Denies having any abdominal pain. Scheduled for lap chole 05/20/2019. Still drains out approximately 200 cc/day of bile through the cholecystostomy tube. No jaundice dark urine or pale stools. Has good appetite No melena or hematochezia.   Past Medical History:  Diagnosis Date  . CLL (chronic lymphocytic leukemia) ([place])   . Depression     Past Surgical History:  Procedure Laterality Date  . APPENDECTOMY    . CARPAL TUNNEL RELEASE    . COLON SURGERY  2002   10 inches of colon taking out   . COLONOSCOPY    . ENDOSCOPIC RETROGRADE CHOLANGIOPANCREATOGRAPHY (ERCP) WITH PROPOFOL N/A 03/01/2019   Procedure: ENDOSCOPIC RETROGRADE CHOLANGIOPANCREATOGRAPHY (ERCP) WITH PROPOFOL;  Surgeon: Rush Landmark Telford Nab., MD;  Location: [place];  Service: Gastroenterology;  Laterality: N/A;  . ERCP  03/01/2019  . HERNIA REPAIR    . IR CHOLANGIOGRAM EXISTING TUBE  03/25/2019  . IR PERC CHOLECYSTOSTOMY  03/02/2019  . REMOVAL OF STONES  03/01/2019   Procedure: REMOVAL OF STONES;  Surgeon: Rush Landmark Telford Nab., MD;  Location: [place];  Service: Gastroenterology;;  . [name]  03/01/2019   Procedure: [name];  Surgeon: [name], MD;  Location: [facility] END[facility]COPY;  Service: Gastroenterology;;    Family History  Problem Relation Age of Onset  . Colon cancer Neg Hx   . Esophageal cancer Neg Hx     Social History    Tobacco Use  . Smoking status: Never Smoker  . Smokeless tobacco: Never Used  Substance Use Topics  . Alcohol use: Yes    Comment: 1 drink before supper  . Drug use: No    Current Outpatient Medications  Medication Sig Dispense Refill  . acetaminophen (TYLENOL) 325 MG tablet Take 2 tablets (650 mg total) by mouth every 6 (six) hours as needed for mild pain (or temp > 100).    . ALPRAZolam (XANAX) 0.25 MG tablet Take 0.25 mg by mouth at bedtime as needed for anxiety.    Marland Kitchen buPROPion (WELLBUTRIN XL) 300 MG 24 hr tablet Take 300 mg by mouth at bedtime.    Marland Kitchen esomeprazole (NEXIUM) 40 MG capsule Take 40 mg by mouth daily.     Marland Kitchen lisinopril (ZESTRIL) 20 MG tablet Take 20 mg by mouth daily.    . traZODone (DESYREL) 100 MG tablet Take 100 mg by mouth at bedtime.     No current facility-administered medications for this visit.     No Known Allergies  Review of Systems:  neg     Physical Exam:    BP 114/72   Temp 98.5 F (36.9 C)   Ht 6' (1.829 m)   Wt 189 lb 6 oz (85.9 kg)   BMI 25.68 kg/m  Filed Weights   04/08/19 1113  Weight: 189 lb 6 oz (85.9 kg)   Constitutional:  Well-developed, in no acute distress. Psychiatric: Normal mood and affect. Behavior is normal. HEENT: Pupils  normal.  Conjunctivae are normal. No scleral icterus. Neck supple.  Cardiovascular: Normal rate, regular rhythm. No edema Pulmonary/chest: Effort normal and breath sounds normal. No wheezing, rales or rhonchi. Abdominal: Soft, nondistended. Nontender. Bowel sounds active throughout. There are no masses palpable. No hepatomegaly.  Cholecystostomy tube.  No tenderness. Rectal:  defered Neurological: Alert and oriented to person place and time. Skin: Skin is warm and dry. No rashes noted.  Liver Function Tests: Recent Labs  Lab 04/01/19 1206  AST 18  ALT 13  ALKPHOS 73  BILITOT 0.5  PROT 6.5  ALBUMIN 4.1      Radiology Studies: Ir Cholangiogram Existing Tube  Result Date: 03/25/2019  INDICATION: 68 year-old with acute cholecystitis and cholecystostomy tube placed on 03/02/2019. Patient reports minimal output from the drain. EXAM: CHOLECYSTOSTOMY TUBE INJECTION MEDICATIONS: None ANESTHESIA/SEDATION: None FLUOROSCOPY TIME:  Fluoroscopy Time: 30 seconds, 3 mGy CONTRAST:  5 mL Omnipaque [id number] COMPLICATIONS: None immediate. PROCEDURE: Patient was placed supine on the interventional table. The cholecystostomy tube was injected with contrast under fluoroscopic guidance. Catheter was flushed with saline and attached to the gravity bag. Patient and wife were instructed on how to flush the catheter. FINDINGS: Drain is well positioned in the gallbladder. Filling defects in the gallbladder compatible with stones. The cystic duct is patent. Contrast fills the central intrahepatic bile ducts. Common bile duct is not completely opacified on this examination. IMPRESSION: Cholecystostomy tube is well positioned in the gallbladder. Instructed the patient and wife to flush the tube at least once every other day. Electronically Signed   By: Markus Daft M.D.   On: 03/25/2019 14:40      Carmell Austria, MD 04/08/2019, 11:26 AM  Cc: Nicoletta Dress, MD

## 2019-04-16 ENCOUNTER — Other Ambulatory Visit (HOSPITAL_COMMUNITY)
Admission: RE | Admit: 2019-04-16 | Discharge: 2019-04-16 | Disposition: A | Discharge status: Home / Self Care | Payer: Medicare Other | Source: Ambulatory Visit | Attending: General Surgery | Admitting: General Surgery

## 2019-04-16 ENCOUNTER — Other Ambulatory Visit: Payer: Self-pay

## 2019-04-16 DIAGNOSIS — K811 Chronic cholecystitis: Secondary | ICD-10-CM | POA: Diagnosis not present

## 2019-04-16 DIAGNOSIS — Z20828 Contact with and (suspected) exposure to other viral communicable diseases: Secondary | ICD-10-CM | POA: Insufficient documentation

## 2019-04-16 DIAGNOSIS — Z01812 Encounter for preprocedural laboratory examination: Secondary | ICD-10-CM | POA: Insufficient documentation

## 2019-04-16 NOTE — Patient Instructions (Addendum)
DUE TO COVID-19 ONLY ONE VISITOR IS ALLOWED TO COME WITH YOU AND STAY IN THE WAITING ROOM ONLY DURING PRE OP AND PROCEDURE DAY OF SURGERY. THE 1 VISITOR MAY VISIT WITH YOU AFTER SURGERY IN YOUR PRIVATE ROOM DURING VISITING HOURS ONLY!  YOU HAVE COMPLETED YOUR COVID -19 TEST . PLEASE CONTINUE THE QUARANTINE INSTRUCTIONS AS OUTLINED IN YOUR HANDOUT.                Jonathon Parker   Your procedure is scheduled on: 04-20-2019   Report to [facility]  Entrance   Report to admitting at 12:00PM     Call this number if you have problems the morning of surgery [place], NO CHEWING GUM [place].   Remember: Do not eat food After Midnight. YOU MAY HAVE CLEAR LIQUIDS FROM MIDNIGHT UNTIL 8:00AM. NOTHING BY MOUTH AFTER 8:00AM!   CLEAR LIQUID DIET   Foods Allowed                                                                     Foods Excluded  Coffee and tea, regular and decaf                             liquids that you cannot  Plain Jell-O any favor except red or purple                                           see through such as: Fruit ices (not with fruit pulp)                                     milk, soups, orange juice  Iced Popsicles                                    All solid food Carbonated beverages, regular and diet                                    Cranberry, grape and apple juices Sports drinks like Gatorade Lightly seasoned clear broth or consume(fat free) Sugar, honey syrup  Sample Menu Breakfast                                Lunch                                     Supper Cranberry juice                    Beef broth  Chicken broth Jell-O                                     Grape juice                           Apple juice Coffee or tea                        Jell-O                                      Popsicle                                                 Coffee or tea                        Coffee or tea  _____________________________________________________________________     Take these medicines the morning of surgery with A SIP OF WATER: Nexium,  Tylenol if needed                                 You may not have any metal on your body including hair pins and              piercings  Do not wear jewelry, make-up, lotions, powders or perfumes, deodorant                        Men may shave face and neck.   Do not bring valuables to the hospital. [place].  Contacts, dentures or bridgework may not be worn into surgery.       Patients discharged the day of surgery will not be allowed to drive home. IF YOU ARE HAVING SURGERY AND GOING HOME THE SAME DAY, YOU MUST HAVE AN ADULT TO DRIVE YOU HOME AND BE WITH YOU FOR 24 HOURS. YOU MAY GO HOME BY TAXI OR UBER OR ORTHERWISE, BUT AN ADULT MUST ACCOMPANY YOU HOME AND STAY WITH YOU FOR 24 HOURS.  Name and phone number of your driver:  Special Instructions: N/A              Please read over the following fact sheets you were given: _____________________________________________________________________             [facility] - Preparing for Surgery Before surgery, you can play an important role.  Because skin is not sterile, your skin needs to be as free of germs as possible.  You can reduce the number of germs on your skin by washing with CHG (chlorahexidine gluconate) soap before surgery.  CHG is an antiseptic cleaner which kills germs and bonds with the skin to continue killing germs even after washing. Please DO NOT use if you have an allergy to CHG or antibacterial soaps.  If your skin becomes reddened/irritated stop using the CHG and inform your nurse when you arrive at Short Stay. Do not  shave (including legs and underarms) for at least 48 hours prior to the first CHG shower.  You may shave your face/neck. Please follow these instructions  carefully:  1.  Shower with CHG Soap the night before surgery and the  morning of Surgery.  2.  If you choose to wash your hair, wash your hair first as usual with your  normal  shampoo.  3.  After you shampoo, rinse your hair and body thoroughly to remove the  shampoo.                           4.  Use CHG as you would any other liquid soap.  You can apply chg directly  to the skin and wash                       Gently with a scrungie or clean washcloth.  5.  Apply the CHG Soap to your body ONLY FROM THE NECK DOWN.   Do not use on face/ open                           Wound or open sores. Avoid contact with eyes, ears mouth and genitals (private parts).                       Wash face,  Genitals (private parts) with your normal soap.             6.  Wash thoroughly, paying special attention to the area where your surgery  will be performed.  7.  Thoroughly rinse your body with warm water from the neck down.  8.  DO NOT shower/wash with your normal soap after using and rinsing off  the CHG Soap.                9.  Pat yourself dry with a clean towel.            10.  Wear clean pajamas.            11.  Place clean sheets on your bed the night of your first shower and do not  sleep with pets. Day of Surgery : Do not apply any lotions/deodorants the morning of surgery.  Please wear clean clothes to the hospital/surgery center.  FAILURE TO FOLLOW THESE INSTRUCTIONS MAY RESULT IN THE CANCELLATION OF YOUR SURGERY PATIENT SIGNATURE_________________________________  NURSE SIGNATURE__________________________________  ________________________________________________________________________

## 2019-04-17 LAB — NOVEL CORONAVIRUS, NAA (HOSP ORDER, SEND-OUT TO REF LAB; TAT 18-24 HRS): SARS-CoV-2, NAA: NOT DETECTED

## 2019-04-19 ENCOUNTER — Other Ambulatory Visit: Payer: Self-pay

## 2019-04-19 ENCOUNTER — Encounter (HOSPITAL_COMMUNITY)
Admission: RE | Admit: 2019-04-19 | Discharge: 2019-04-19 | Disposition: A | Discharge status: Home / Self Care | Payer: Medicare Other | Source: Ambulatory Visit | Attending: General Surgery | Admitting: General Surgery

## 2019-04-19 ENCOUNTER — Encounter (HOSPITAL_COMMUNITY): Payer: Self-pay

## 2019-04-19 DIAGNOSIS — Z01818 Encounter for other preprocedural examination: Secondary | ICD-10-CM | POA: Insufficient documentation

## 2019-04-19 DIAGNOSIS — K8066 Calculus of gallbladder and bile duct with acute and chronic cholecystitis without obstruction: Secondary | ICD-10-CM | POA: Diagnosis not present

## 2019-04-19 DIAGNOSIS — R0602 Shortness of breath: Secondary | ICD-10-CM | POA: Diagnosis not present

## 2019-04-19 HISTORY — DX: Essential (primary) hypertension: I10

## 2019-04-19 HISTORY — DX: Gastro-esophageal reflux disease without esophagitis: K21.9

## 2019-04-19 HISTORY — DX: Cholecystitis, unspecified: K81.9

## 2019-04-19 LAB — CBC WITH DIFFERENTIAL/PLATELET
Abs Immature Granulocytes: 0.04 10*3/uL (ref 0.00–0.07)
Basophils Absolute: 0.1 10*3/uL (ref 0.0–0.1)
Basophils Relative: 1 %
Eosinophils Absolute: 0.1 10*3/uL (ref 0.0–0.5)
Eosinophils Relative: 1 %
HCT: 44.6 % (ref 39.0–52.0)
Hemoglobin: 14.3 g/dL (ref 13.0–17.0)
Immature Granulocytes: 0 %
Lymphocytes Relative: 84 %
Lymphs Abs: 20.7 10*3/uL — ABNORMAL HIGH (ref 0.7–4.0)
MCH: 31.6 pg (ref 26.0–34.0)
MCHC: 32.1 g/dL (ref 30.0–36.0)
MCV: 98.5 fL (ref 80.0–100.0)
Monocytes Absolute: 0.5 10*3/uL (ref 0.1–1.0)
Monocytes Relative: 2 %
Neutro Abs: 2.8 10*3/uL (ref 1.7–7.7)
Neutrophils Relative %: 12 %
Platelets: 197 10*3/uL (ref 150–400)
RBC: 4.53 MIL/uL (ref 4.22–5.81)
RDW: 13.5 % (ref 11.5–15.5)
WBC: 24.3 10*3/uL — ABNORMAL HIGH (ref 4.0–10.5)
nRBC: 0 % (ref 0.0–0.2)

## 2019-04-19 LAB — ABO/RH: ABO/RH(D): A POS

## 2019-04-19 LAB — PATHOLOGIST SMEAR REVIEW

## 2019-04-19 NOTE — Progress Notes (Signed)
PCP - Nicoletta Dress, MD Cardiologist -   Chest x-ray -  EKG - DONE TODAY IN Epic  Stress Test -  ECHO -  Cardiac Cath -   Sleep Study -  CPAP -   Fasting Blood Sugar -  Checks Blood Sugar _____ times a day  Blood Thinner Instructions: Aspirin Instructions: Last Dose:  Anesthesia review:  N/A   Patient denies shortness of breath, fever, cough and chest pain at PAT appointment   Patient verbalized understanding of instructions that were given to them at the PAT appointment. Patient was also instructed that they will need to review over the PAT instructions again at home before surgery.

## 2019-04-20 ENCOUNTER — Other Ambulatory Visit: Payer: Medicare Other

## 2019-04-20 ENCOUNTER — Ambulatory Visit (HOSPITAL_COMMUNITY)
Admission: RE | Admit: 2019-04-20 | Discharge: 2019-04-20 | Disposition: A | Discharge status: Home / Self Care | Payer: Medicare Other | Source: Home / Self Care | Attending: General Surgery | Admitting: General Surgery

## 2019-04-20 ENCOUNTER — Ambulatory Visit (HOSPITAL_COMMUNITY): Payer: Medicare Other | Admitting: Physician Assistant

## 2019-04-20 ENCOUNTER — Ambulatory Visit (HOSPITAL_COMMUNITY): Payer: Medicare Other | Admitting: Anesthesiology

## 2019-04-20 ENCOUNTER — Encounter (HOSPITAL_COMMUNITY)
Admission: RE | Disposition: A | Discharge status: Home / Self Care | Payer: Self-pay | Source: Home / Self Care | Attending: General Surgery

## 2019-04-20 ENCOUNTER — Encounter (HOSPITAL_COMMUNITY): Payer: Self-pay

## 2019-04-20 HISTORY — PX: CHOLECYSTECTOMY: SHX55

## 2019-04-20 LAB — COMPREHENSIVE METABOLIC PANEL
ALT: 20 U/L (ref 0–44)
AST: 19 U/L (ref 15–41)
Albumin: 4.2 g/dL (ref 3.5–5.0)
Alkaline Phosphatase: 89 U/L (ref 38–126)
Anion gap: 8 (ref 5–15)
BUN: 10 mg/dL (ref 8–23)
CO2: 27 mmol/L (ref 22–32)
Calcium: 9.1 mg/dL (ref 8.9–10.3)
Chloride: 105 mmol/L (ref 98–111)
Creatinine, Ser: 1.2 mg/dL (ref 0.61–1.24)
GFR calc Af Amer: >60 mL/min (ref >60)
GFR calc non Af Amer: >60 mL/min (ref >60)
Glucose, Bld: 91 mg/dL (ref 70–99)
Potassium: 3.9 mmol/L (ref 3.5–5.1)
Sodium: 140 mmol/L (ref 135–145)
Total Bilirubin: 1.2 mg/dL (ref 0.3–1.2)
Total Protein: 7.1 g/dL (ref 6.5–8.1)

## 2019-04-20 LAB — TYPE AND SCREEN
ABO/RH(D): A POS
Antibody Screen: NEGATIVE

## 2019-04-20 SURGERY — LAPAROSCOPIC CHOLECYSTECTOMY
Anesthesia: General | Site: Abdomen

## 2019-04-20 MED ORDER — LIDOCAINE 2% (20 MG/ML) 5 ML SYRINGE
INTRAMUSCULAR | Status: AC
  Filled 2019-04-20: qty 5

## 2019-04-20 MED ORDER — ACETAMINOPHEN 500 MG PO TABS
1000.0000 mg | ORAL_TABLET | ORAL | Status: AC
  Administered 2019-04-20: 1000 mg via ORAL
  Filled 2019-04-20: qty 2

## 2019-04-20 MED ORDER — ROCURONIUM BROMIDE 10 MG/ML (PF) SYRINGE
PREFILLED_SYRINGE | INTRAVENOUS | Status: AC
  Filled 2019-04-20: qty 10

## 2019-04-20 MED ORDER — ROCURONIUM BROMIDE 10 MG/ML (PF) SYRINGE
PREFILLED_SYRINGE | INTRAVENOUS | Status: DC | PRN
  Administered 2019-04-20: 20 mg via INTRAVENOUS
  Administered 2019-04-20: 40 mg via INTRAVENOUS

## 2019-04-20 MED ORDER — LIDOCAINE 2% (20 MG/ML) 5 ML SYRINGE
INTRAMUSCULAR | Status: DC | PRN
  Administered 2019-04-20: 100 mg via INTRAVENOUS

## 2019-04-20 MED ORDER — SUCCINYLCHOLINE CHLORIDE 200 MG/10ML IV SOSY
PREFILLED_SYRINGE | INTRAVENOUS | Status: AC
  Filled 2019-04-20: qty 30

## 2019-04-20 MED ORDER — BUPIVACAINE HCL (PF) 0.25 % IJ SOLN
INTRAMUSCULAR | Status: AC
  Filled 2019-04-20: qty 30

## 2019-04-20 MED ORDER — ONDANSETRON HCL 4 MG/2ML IJ SOLN
INTRAMUSCULAR | Status: DC | PRN
  Administered 2019-04-20: 4 mg via INTRAVENOUS

## 2019-04-20 MED ORDER — OXYCODONE HCL 5 MG PO TABS: 5.0000 mg | ORAL_TABLET | Freq: Four times a day (QID) | ORAL | 0 refills | Status: DC | PRN

## 2019-04-20 MED ORDER — PHENYLEPHRINE 40 MCG/ML (10ML) SYRINGE FOR IV PUSH (FOR BLOOD PRESSURE SUPPORT)
PREFILLED_SYRINGE | INTRAVENOUS | Status: DC | PRN
  Administered 2019-04-20 (×2): 80 ug via INTRAVENOUS

## 2019-04-20 MED ORDER — HYDRALAZINE HCL 20 MG/ML IJ SOLN
INTRAMUSCULAR | Status: DC | PRN
  Administered 2019-04-20 (×2): 2 mg via INTRAVENOUS

## 2019-04-20 MED ORDER — PROPOFOL 10 MG/ML IV BOLUS
INTRAVENOUS | Status: AC
  Filled 2019-04-20: qty 20

## 2019-04-20 MED ORDER — DEXAMETHASONE SODIUM PHOSPHATE 10 MG/ML IJ SOLN
INTRAMUSCULAR | Status: DC | PRN
  Administered 2019-04-20: 10 mg via INTRAVENOUS

## 2019-04-20 MED ORDER — FENTANYL CITRATE (PF) 250 MCG/5ML IJ SOLN
INTRAMUSCULAR | Status: AC
  Filled 2019-04-20: qty 5

## 2019-04-20 MED ORDER — SUCCINYLCHOLINE CHLORIDE 200 MG/10ML IV SOSY
PREFILLED_SYRINGE | INTRAVENOUS | Status: DC | PRN
  Administered 2019-04-20: 100 mg via INTRAVENOUS

## 2019-04-20 MED ORDER — CHLORHEXIDINE GLUCONATE CLOTH 2 % EX PADS: 6.0000 | MEDICATED_PAD | Freq: Once | CUTANEOUS | Status: DC

## 2019-04-20 MED ORDER — SODIUM CHLORIDE 0.9 % IV SOLN
2.0000 g | INTRAVENOUS | Status: AC
  Administered 2019-04-20: 2 g via INTRAVENOUS
  Filled 2019-04-20: qty 2

## 2019-04-20 MED ORDER — HYDROMORPHONE HCL 1 MG/ML IJ SOLN
INTRAMUSCULAR | Status: AC
  Filled 2019-04-20: qty 1

## 2019-04-20 MED ORDER — MIDAZOLAM HCL 2 MG/2ML IJ SOLN
INTRAMUSCULAR | Status: AC
  Filled 2019-04-20: qty 2

## 2019-04-20 MED ORDER — MIDAZOLAM HCL 5 MG/5ML IJ SOLN
INTRAMUSCULAR | Status: DC | PRN
  Administered 2019-04-20: 2 mg via INTRAVENOUS

## 2019-04-20 MED ORDER — LACTATED RINGERS IR SOLN
Status: DC | PRN
  Administered 2019-04-20: 1000 mL

## 2019-04-20 MED ORDER — ROCURONIUM BROMIDE 10 MG/ML (PF) SYRINGE
PREFILLED_SYRINGE | INTRAVENOUS | Status: AC
  Filled 2019-04-20: qty 20

## 2019-04-20 MED ORDER — HYDRALAZINE HCL 20 MG/ML IJ SOLN
INTRAMUSCULAR | Status: AC
  Filled 2019-04-20: qty 1

## 2019-04-20 MED ORDER — HYDROMORPHONE HCL 1 MG/ML IJ SOLN
0.2500 mg | INTRAMUSCULAR | Status: DC | PRN
  Administered 2019-04-20 (×3): 0.5 mg via INTRAVENOUS

## 2019-04-20 MED ORDER — BUPIVACAINE HCL (PF) 0.25 % IJ SOLN
INTRAMUSCULAR | Status: DC | PRN
  Administered 2019-04-20: 30 mL

## 2019-04-20 MED ORDER — BUPIVACAINE HCL (PF) 0.5 % IJ SOLN
INTRAMUSCULAR | Status: AC
  Filled 2019-04-20: qty 30

## 2019-04-20 MED ORDER — SUCCINYLCHOLINE CHLORIDE 200 MG/10ML IV SOSY
PREFILLED_SYRINGE | INTRAVENOUS | Status: AC
  Filled 2019-04-20: qty 10

## 2019-04-20 MED ORDER — LACTATED RINGERS IV SOLN
INTRAVENOUS | Status: DC
  Administered 2019-04-20 (×2): via INTRAVENOUS

## 2019-04-20 MED ORDER — PHENYLEPHRINE 40 MCG/ML (10ML) SYRINGE FOR IV PUSH (FOR BLOOD PRESSURE SUPPORT)
PREFILLED_SYRINGE | INTRAVENOUS | Status: AC
  Filled 2019-04-20: qty 30

## 2019-04-20 MED ORDER — 0.9 % SODIUM CHLORIDE (POUR BTL) OPTIME
TOPICAL | Status: DC | PRN
  Administered 2019-04-20: 15:00:00 1000 mL

## 2019-04-20 MED ORDER — GABAPENTIN 300 MG PO CAPS
300.0000 mg | ORAL_CAPSULE | ORAL | Status: AC
  Administered 2019-04-20: 300 mg via ORAL
  Filled 2019-04-20: qty 1

## 2019-04-20 MED ORDER — PROPOFOL 10 MG/ML IV BOLUS
INTRAVENOUS | Status: DC | PRN
  Administered 2019-04-20: 120 mg via INTRAVENOUS

## 2019-04-20 MED ORDER — SUGAMMADEX SODIUM 200 MG/2ML IV SOLN
INTRAVENOUS | Status: DC | PRN
  Administered 2019-04-20: 200 mg via INTRAVENOUS

## 2019-04-20 MED ORDER — FENTANYL CITRATE (PF) 250 MCG/5ML IJ SOLN
INTRAMUSCULAR | Status: DC | PRN
  Administered 2019-04-20 (×2): 100 ug via INTRAVENOUS
  Administered 2019-04-20: 50 ug via INTRAVENOUS

## 2019-04-20 MED ORDER — EPHEDRINE SULFATE-NACL 50-0.9 MG/10ML-% IV SOSY
PREFILLED_SYRINGE | INTRAVENOUS | Status: DC | PRN
  Administered 2019-04-20 (×2): 10 mg via INTRAVENOUS

## 2019-04-20 SURGICAL SUPPLY — 52 items
APPLICATOR ARISTA FLEXITIP XL (MISCELLANEOUS) IMPLANT
APPLIER CLIP 5 13 M/L LIGAMAX5 (MISCELLANEOUS) ×3
APPLIER CLIP ROT 10 11.4 M/L (STAPLE)
BENZOIN TINCTURE PRP APPL 2/3 (GAUZE/BANDAGES/DRESSINGS) ×2 IMPLANT
BNDG ADH 1X3 SHEER STRL LF (GAUZE/BANDAGES/DRESSINGS) ×6 IMPLANT
CABLE HIGH FREQUENCY MONO STRZ (ELECTRODE) ×3 IMPLANT
CHLORAPREP W/TINT 26 (MISCELLANEOUS) ×3 IMPLANT
CLIP APPLIE 5 13 M/L LIGAMAX5 (MISCELLANEOUS) IMPLANT
CLIP APPLIE ROT 10 11.4 M/L (STAPLE) IMPLANT
CLIP VESOLOCK MED LG 6/CT (CLIP) IMPLANT
CLOSURE WOUND 1/2 X4 (GAUZE/BANDAGES/DRESSINGS) ×1
COVER MAYO STAND STRL (DRAPES) IMPLANT
COVER SURGICAL LIGHT HANDLE (MISCELLANEOUS) ×3 IMPLANT
COVER WAND RF STERILE (DRAPES) IMPLANT
DECANTER SPIKE VIAL GLASS SM (MISCELLANEOUS) ×3 IMPLANT
DRAPE C-ARM 42X120 X-RAY (DRAPES) IMPLANT
DRSG TEGADERM 2-3/8X2-3/4 SM (GAUZE/BANDAGES/DRESSINGS) ×2 IMPLANT
ELECT PENCIL ROCKER SW 15FT (MISCELLANEOUS) IMPLANT
ELECT REM PT RETURN 15FT ADLT (MISCELLANEOUS) ×3 IMPLANT
ENDOLOOP SUT PDS II  0 18 (SUTURE) ×6
ENDOLOOP SUT PDS II 0 18 (SUTURE) IMPLANT
GAUZE SPONGE 2X2 8PLY STRL LF (GAUZE/BANDAGES/DRESSINGS) IMPLANT
GLOVE BIO SURGEON STRL SZ7.5 (GLOVE) ×3 IMPLANT
GLOVE INDICATOR 8.0 STRL GRN (GLOVE) ×3 IMPLANT
GOWN STRL REUS W/TWL XL LVL3 (GOWN DISPOSABLE) ×9 IMPLANT
GRASPER SUT TROCAR 14GX15 (MISCELLANEOUS) ×2 IMPLANT
HEMOSTAT ARISTA ABSORB 3G PWDR (HEMOSTASIS) IMPLANT
HEMOSTAT SNOW SURGICEL 2X4 (HEMOSTASIS) ×2 IMPLANT
KIT BASIN OR (CUSTOM PROCEDURE TRAY) ×3 IMPLANT
KIT TURNOVER KIT A (KITS) IMPLANT
L-HOOK LAP DISP 36CM (ELECTROSURGICAL)
LHOOK LAP DISP 36CM (ELECTROSURGICAL) IMPLANT
PENCIL SMOKE EVACUATOR (MISCELLANEOUS) IMPLANT
POUCH RETRIEVAL ECOSAC 10 (ENDOMECHANICALS) ×1 IMPLANT
POUCH RETRIEVAL ECOSAC 10MM (ENDOMECHANICALS) ×2
SCISSORS LAP 5X35 DISP (ENDOMECHANICALS) ×3 IMPLANT
SET CHOLANGIOGRAPH MIX (MISCELLANEOUS) IMPLANT
SET IRRIG TUBING LAPAROSCOPIC (IRRIGATION / IRRIGATOR) ×3 IMPLANT
SET TUBE SMOKE EVAC HIGH FLOW (TUBING) ×3 IMPLANT
SLEEVE XCEL OPT CAN 5 100 (ENDOMECHANICALS) ×6 IMPLANT
SPONGE GAUZE 2X2 STER 10/PKG (GAUZE/BANDAGES/DRESSINGS) ×2
STRIP CLOSURE SKIN 1/2X4 (GAUZE/BANDAGES/DRESSINGS) ×1 IMPLANT
SUT MNCRL AB 4-0 PS2 18 (SUTURE) ×3 IMPLANT
SUT VIC AB 0 UR5 27 (SUTURE) IMPLANT
SUT VICRYL 0 TIES 12 18 (SUTURE) ×2 IMPLANT
SUT VICRYL 0 UR6 27IN ABS (SUTURE) IMPLANT
TOWEL OR 17X26 10 PK STRL BLUE (TOWEL DISPOSABLE) ×3 IMPLANT
TOWEL OR NON WOVEN STRL DISP B (DISPOSABLE) ×3 IMPLANT
TRAY LAPAROSCOPIC (CUSTOM PROCEDURE TRAY) ×3 IMPLANT
TROCAR BLADELESS OPT 5 100 (ENDOMECHANICALS) ×3 IMPLANT
TROCAR XCEL BLUNT TIP 100MML (ENDOMECHANICALS) IMPLANT
TROCAR XCEL NON-BLD 11X100MML (ENDOMECHANICALS) ×2 IMPLANT

## 2019-04-20 NOTE — Anesthesia Preprocedure Evaluation (Addendum)
Anesthesia Evaluation  Patient identified by MRN, date of birth, ID band Patient awake    Reviewed: Allergy & Precautions, H&P , NPO status , Patient's Chart, lab work & pertinent test results  Airway Mallampati: III  TM Distance: >3 FB Neck ROM: Full    Dental no notable dental hx. (+) Teeth Intact, Dental Advisory Given   Pulmonary neg pulmonary ROS,    Pulmonary exam normal breath sounds clear to auscultation       Cardiovascular hypertension, Pt. on medications  Rhythm:Regular Rate:Normal     Neuro/Psych Depression negative neurological ROS     GI/Hepatic Neg liver ROS, GERD  Medicated and Controlled,  Endo/Other  negative endocrine ROS  Renal/GU negative Renal ROS  negative genitourinary   Musculoskeletal   Abdominal   Peds  Hematology negative hematology ROS (+)   Anesthesia Other Findings   Reproductive/Obstetrics negative OB ROS                            Anesthesia Physical Anesthesia Plan  ASA: II  Anesthesia Plan: General   Post-op Pain Management:    Induction: Intravenous  PONV Risk Score and Plan: 3 and Ondansetron, Dexamethasone and Midazolam  Airway Management Planned: Oral ETT  Additional Equipment:   Intra-op Plan:   Post-operative Plan: Extubation in OR  Informed Consent: I have reviewed the patients History and Physical, chart, labs and discussed the procedure including the risks, benefits and alternatives for the proposed anesthesia with the patient or authorized representative who has indicated his/her understanding and acceptance.     Dental advisory given  Plan Discussed with: CRNA  Anesthesia Plan Comments:         Anesthesia Quick Evaluation

## 2019-04-20 NOTE — Discharge Instructions (Signed)
[facility] °LAPAROSCOPIC SURGERY: POST OP INSTRUCTIONS °Always review your discharge instruction sheet given to you by the facility where your surgery was performed. °IF YOU HAVE DISABILITY OR FAMILY LEAVE FORMS, YOU MUST BRING THEM TO THE OFFICE FOR PROCESSING.   °DO NOT GIVE THEM TO YOUR DOCTOR. ° °PAIN CONTROL ° °1. First take acetaminophen (Tylenol) AND/or ibuprofen (Advil) to control your pain after surgery.  Follow directions on package.  Taking acetaminophen (Tylenol) and/or ibuprofen (Advil) regularly after surgery will help to control your pain and lower the amount of prescription pain medication you may need.  You should not take more than 3,000 mg (3 grams) of acetaminophen (Tylenol) in 24 hours.  You should not take ibuprofen (Advil), aleve, motrin, naprosyn or other NSAIDS if you have a history of stomach ulcers or chronic kidney disease.  °2. A prescription for pain medication may be given to you upon discharge.  Take your pain medication as prescribed, if you still have uncontrolled pain after taking acetaminophen (Tylenol) or ibuprofen (Advil). °3. Use ice packs to help control pain. °4. If you need a refill on your pain medication, please contact your pharmacy.  They will contact our office to request authorization. Prescriptions will not be filled after 5pm or on week-ends. ° °HOME MEDICATIONS °5. Take your usually prescribed medications unless otherwise directed. ° °DIET °6. You should follow a light diet the first few days after arrival home.  Be sure to include lots of fluids daily. Avoid fatty, fried foods.  ° °CONSTIPATION °7. It is common to experience some constipation after surgery and if you are taking pain medication.  Increasing fluid intake and taking a stool softener (such as Colace) will usually help or prevent this problem from occurring.  A mild laxative (Milk of Magnesia or Miralax) should be taken according to package instructions if there are no bowel  movements after 48 hours. ° °WOUND/INCISION CARE °8. Most patients will experience some swelling and bruising in the area of the incisions.  Ice packs will help.  Swelling and bruising can take several days to resolve.  °9. Unless discharge instructions indicate otherwise, follow guidelines below  °a. STERI-STRIPS - you may remove your outer bandages 48 hours after surgery, and you may shower at that time.  You have steri-strips (small skin tapes) in place directly over the incision.  These strips should be left on the skin for 7-10 days.   °b. DERMABOND/SKIN GLUE - you may shower in 24 hours.  The glue will flake off over the next 2-3 weeks. °10. Any sutures or staples will be removed at the office during your follow-up visit. ° °ACTIVITIES °11. You may resume regular (light) daily activities beginning the next day--such as daily self-care, walking, climbing stairs--gradually increasing activities as tolerated.  You may have sexual intercourse when it is comfortable.  Refrain from any heavy lifting or straining until approved by your doctor. °a. You may drive when you are no longer taking prescription pain medication, you can comfortably wear a seatbelt, and you can safely maneuver your car and apply brakes. ° °FOLLOW-UP °12. You should see your doctor in the office for a follow-up appointment approximately 2-3 weeks after your surgery.  You should have been given your post-op/follow-up appointment when your surgery was scheduled.  If you did not receive a post-op/follow-up appointment, make sure that you call for this appointment within a day or two after you arrive home to insure a convenient appointment time. ° °OTHER   INSTRUCTIONS °13. DO NOT LIFT/PUSH/PULL ANYTHING GREATER THAN 10 LBS FOR 2 WEEKS ° °WHEN TO CALL YOUR DOCTOR: °1. Fever over 101.0 °2. Inability to urinate °3. Continued bleeding from incision. °4. Increased pain, redness, or drainage from the incision. °5. Increasing abdominal pain ° °The clinic  staff is available to answer your questions during regular business hours.  Please don’t hesitate to call and ask to speak to one of the nurses for clinical concerns.  If you have a medical emergency, go to the nearest emergency room or call 911.  A surgeon from [facility] is always on call at the hospital. °[address], [address], [place], [place]  [address] ? [address] [address], [place], [place]   [address] °[phone] ? [phone] ? FAX [phone] °Web site: [email or web address] ° °••••••••• ° ° °Managing Your Pain After Surgery Without Opioids ° ° ° °Thank you for participating in our program to help patients manage their pain after surgery without opioids. This is part of our effort to provide you with the best care possible, without exposing you or your family to the risk that opioids pose. ° °What pain can I expect after surgery? °You can expect to have some pain after surgery. This is normal. The pain is typically worse the day after surgery, and quickly begins to get better. °Many studies have found that many patients are able to manage their pain after surgery with Over-the-Counter (OTC) medications such as Tylenol and Motrin. If you have a condition that does not allow you to take Tylenol or Motrin, notify your surgical team. ° °How will I manage my pain? °The best strategy for controlling your pain after surgery is around the clock pain control with Tylenol (acetaminophen) and Motrin (ibuprofen or Advil). Alternating these medications with each other allows you to maximize your pain control. In addition to Tylenol and Motrin, you can use heating pads or ice packs on your incisions to help reduce your pain. ° °How will I alternate your regular strength over-the-counter pain medication? °You will take a dose of pain medication every three hours. °; Start by taking 650 mg of Tylenol (2 pills of 325 mg) °; 3 hours later take 600 mg of Motrin (3 pills of 200 mg) °; 3 hours  after taking the Motrin take 650 mg of Tylenol °; 3 hours after that take 600 mg of Motrin. ° ° °- 1 - ° °See example - if your first dose of Tylenol is at 12:00 PM ° ° °12:00 PM Tylenol 650 mg (2 pills of 325 mg)  °3:00 PM Motrin 600 mg (3 pills of 200 mg)  °6:00 PM Tylenol 650 mg (2 pills of 325 mg)  °9:00 PM Motrin 600 mg (3 pills of 200 mg)  °Continue alternating every 3 hours  ° °We recommend that you follow this schedule around-the-clock for at least 3 days after surgery, or until you feel that it is no longer needed. Use the table on the last page of this handout to keep track of the medications you are taking. °Important: °Do not take more than 3000mg of Tylenol or 2300mg of Motrin in a 24-hour period. °Do not take ibuprofen/Motrin if you have a history of bleeding stomach ulcers, severe kidney disease, &/or actively taking a blood thinner ° °What if I still have pain? °If you have pain that is not controlled with the over-the-counter pain medications (Tylenol and Motrin or Advil) you might have what we call “breakthrough” pain. You will   receive a prescription for a small amount of an opioid pain medication such as Oxycodone, Tramadol, or Tylenol with Codeine. Use these opioid pills in the first 24 hours after surgery if you have breakthrough pain. Do not take more than 1 pill every 4-6 hours.  If you still have uncontrolled pain after using all opioid pills, don't hesitate to call our staff using the number provided. We will help make sure you are managing your pain in the best way possible, and if necessary, we can provide a prescription for additional pain medication.   Day 1    Time  Name of Medication Number of pills taken  Amount of Acetaminophen  Pain Level   Comments  AM PM       AM PM       AM PM       AM PM       AM PM       AM PM       AM PM       AM PM       Total Daily amount of Acetaminophen Do not take more than  3,000 mg per day      Day 2    Time  Name of  Medication Number of pills taken  Amount of Acetaminophen  Pain Level   Comments  AM PM       AM PM       AM PM       AM PM       AM PM       AM PM       AM PM       AM PM       Total Daily amount of Acetaminophen Do not take more than  3,000 mg per day      Day 3    Time  Name of Medication Number of pills taken  Amount of Acetaminophen  Pain Level   Comments  AM PM       AM PM       AM PM       AM PM          AM PM       AM PM       AM PM       AM PM       Total Daily amount of Acetaminophen Do not take more than  3,000 mg per day      Day 4    Time  Name of Medication Number of pills taken  Amount of Acetaminophen  Pain Level   Comments  AM PM       AM PM       AM PM       AM PM       AM PM       AM PM       AM PM       AM PM       Total Daily amount of Acetaminophen Do not take more than  3,000 mg per day      Day 5    Time  Name of Medication Number of pills taken  Amount of Acetaminophen  Pain Level   Comments  AM PM       AM PM       AM PM       AM PM       AM PM         AM PM       AM PM       AM PM       Total Daily amount of Acetaminophen Do not take more than  3,000 mg per day       Day 6    Time  Name of Medication Number of pills taken  Amount of Acetaminophen  Pain Level  Comments  AM PM       AM PM       AM PM       AM PM       AM PM       AM PM       AM PM       AM PM       Total Daily amount of Acetaminophen Do not take more than  3,000 mg per day      Day 7    Time  Name of Medication Number of pills taken  Amount of Acetaminophen  Pain Level   Comments  AM PM       AM PM       AM PM       AM PM       AM PM       AM PM       AM PM       AM PM       Total Daily amount of Acetaminophen Do not take more than  3,000 mg per day        For additional information about how and where to safely dispose of unused opioid medications - https://www.morepowerfulnc.org  Disclaimer: This  document contains information and/or instructional materials adapted from [facility] for the typical patient with your condition. It does not replace medical advice from your health care provider because your experience may differ from that of the typical patient. Talk to your health care provider if you have any questions about this document, your condition or your treatment plan. Adapted from [facility]   

## 2019-04-20 NOTE — Anesthesia Postprocedure Evaluation (Signed)
Anesthesia Post Note  Patient: Jonathon Parker  Procedure(s) Performed: LAPAROSCOPIC CHOLECYSTECTOMY (N/A Abdomen)     Patient location during evaluation: PACU Anesthesia Type: General Level of consciousness: awake and alert Pain management: pain level controlled Vital Signs Assessment: post-procedure vital signs reviewed and stable Respiratory status: spontaneous breathing, nonlabored ventilation and respiratory function stable Cardiovascular status: blood pressure returned to baseline and stable Postop Assessment: no apparent nausea or vomiting Anesthetic complications: no    Last Vitals:  Vitals:   04/20/19 1600 04/20/19 1615  BP: (!) 168/92 (!) 155/86  Pulse: 63 68  Resp: 14 15  Temp:  36.6 C  SpO2: 100% 93%    Last Pain:  Vitals:   04/20/19 1615  TempSrc:   PainSc: 3                  Kirstina Leinweber,W. EDMOND

## 2019-04-20 NOTE — Op Note (Signed)
Jonathon Parker [id number] [birth year] 04/20/2019  Laparoscopic Cholecystectomy Procedure Note  Indications: This patient presents with symptomatic gallbladder disease and will undergo laparoscopic cholecystectomy.  He had undergone percutaneous cholecystostomy tube placement for acute calculus cholecystitis.  He subsequently underwent ERCP with sphincterotomy for choledocholithiasis.  He was felt ready for interval cholecystectomy today  Pre-operative Diagnosis: Calculus of gallbladder with other cholecystitis, without mention of obstruction; h/o percutaneous cholecystostomy tube placement  Post-operative Diagnosis: Same  Surgeon: Greer Pickerel MD FACS  Assistants: Gurney Maxin MD FACS  Anesthesia: General endotracheal anesthesia  Procedure Details  The patient was seen again in the Holding Room. The risks, benefits, complications, treatment options, and expected outcomes were discussed with the patient. The possibilities of reaction to medication, pulmonary aspiration, perforation of viscus, bleeding, recurrent infection, finding a normal gallbladder, the need for additional procedures, failure to diagnose a condition, the possible need to convert to an open procedure, and creating a complication requiring transfusion or operation were discussed with the patient. The likelihood of improving the patient's symptoms with return to their baseline status is good.  The patient and/or family concurred with the proposed plan, giving informed consent. The site of surgery properly noted. The patient was taken to Operating Room, identified as Jonathon Parker and the procedure verified as Laparoscopic Cholecystectomy with possible Intraoperative Cholangiogram. A Time Out was held and the above information confirmed. Antibiotic prophylaxis was administered.   Prior to the induction of general anesthesia, antibiotic prophylaxis was administered. General endotracheal anesthesia was then administered and tolerated  well. After the induction, the abdomen was prepped with Chloraprep and draped in the sterile fashion. The patient was positioned in the supine position.  The patient had had a prior midline incision from prior colon surgery therefore I decided to gain access to the abdomen using the Optiview technique in the left upper quadrant.  A small incision was made in the left midclavicular line just below the left subcostal margin with a #11 blade.  Then using a 0 degree 5 mm laparoscope I advanced it through all layers of the abdominal wall and entered the abdominal cavity.  There were no adhesions in the left upper quadrant.  There were omental adhesions in the midline from the pelvis all the way up to the falciform ligament.  I was able to see through a thin film of adhesive band and was able to get the laparoscope to the right abdomen.  There is no adhesions on the right side of the abdomen.  My assistant then placed two 5 mm trochars on the right side of the abdomen after doing a block on that side with Marcaine.  1 in the right lateral abdominal wall and one right in the midclavicular line all under direct visualization.  We then placed the laparoscope in the right abdomen and then took down some of the omental adhesions from the anterior abdominal wall with blunt dissection as well as with EndoShears with electrocautery.  We only took down the omental adhesions just to above the umbilicus.  There is no bowel within the adhesions.  We then placed an 11 mm trocar in the supraumbilical position under direct visualization.  We positioned the patient in reverse Trendelenburg, tilted slightly to the patient's left.  The gallbladder was identified, the fundus grasped and retracted cephalad.  There is some adhesions from the dome of the gallbladder to the anterior abdominal wall along with where the cholecystostomy tube came through the abdominal wall into the gallbladder.  These adhesions were  taken down and then I  transected the cholecystostomy tube with EndoShears thus allowing more retraction of the gallbladder toward the patient's right shoulder.  Adhesions were lysed bluntly and with the electrocautery where indicated, taking care not to injure any adjacent organs or viscus. The infundibulum was grasped and retracted laterally, exposing the peritoneum overlying the triangle of Calot. This was then divided and exposed in a blunt fashion.  The patient had somewhat dense woody inflammation in this area.  After using the suction irrigator catheter and the [facility] we were ultimately able to achieve a critical view.  A critical view of the cystic duct and cystic artery was obtained.  The cystic duct was clearly identified and bluntly dissected circumferentially.  The cystic duct was somewhat dilated and densely inflamed.  It would not accommodate a 5 mm clip.  Therefore I decided I was going to need to place a PDS Endoloops around the cystic duct stump.   The cystic artery which had been identified & dissected free was ligated with clips and divided.   The cystic duct was then cut with EndoShears and ligated with two PDS endoloops.  We confirmed that the Endoloops were circumferentially around the cystic duct stump. The gallbladder was dissected from the liver bed in retrograde fashion with the electrocautery.  There were a few gallstones that spilled from the gallbladder.  The gallbladder was removed and placed in an Ecco sac.  We placed the spilled gallstones into the eco-sac as well. The liver bed was irrigated and inspected. Hemostasis was achieved with the electrocautery. Copious irrigation was utilized and was repeatedly aspirated until clear.  I decided to place a piece of Ethicon surgical snow in the gallbladder fossa. The gallbladder and Ecco sac were then removed through the umbilical port site.  2 interrupted 0 Vicryls were placed at the supraumbilical fascial defect using the PMI suture passer with  laparoscopic assistance.      We again inspected the right upper quadrant for hemostasis.  The umbilical closure was inspected and there was no air leak and nothing trapped within the closure. Pneumoperitoneum was released as we removed the trocars.  4-0 Monocryl was used to close the skin.   Benzoin, steri-strips, and clean dressings were applied. The patient was then extubated and brought to the recovery room in stable condition. Instrument, sponge, and needle counts were correct at closure and at the conclusion of the case.   Findings: Chronic Cholecystitis with Cholelithiasis +snow +critical view  Estimated Blood Loss: less than 50 mL         Drains: none         Specimens: Gallbladder, part of cholecystostomy tube           Complications: None; patient tolerated the procedure well.         Disposition: PACU - hemodynamically stable.         Condition: stable  Leighton Ruff. Redmond Pulling, MD, FACS General, Bariatric, & Minimally Invasive Surgery [facility], [place]

## 2019-04-20 NOTE — Anesthesia Procedure Notes (Signed)
Procedure Name: Intubation Date/Time: 04/20/2019 2:00 PM Performed by: Mitzie Na, CRNA Pre-anesthesia Checklist: Patient identified, Emergency Drugs available, Suction available and Patient being monitored Patient Re-evaluated:Patient Re-evaluated prior to induction Oxygen Delivery Method: Circle system utilized Preoxygenation: Pre-oxygenation with 100% oxygen Induction Type: IV induction and Rapid sequence Laryngoscope Size: Mac and 3 Grade View: Grade I Tube type: Oral Tube size: 7.5 mm Number of attempts: 1 Airway Equipment and Method: Stylet and Oral airway Placement Confirmation: ETT inserted through vocal cords under direct vision,  positive ETCO2 and breath sounds checked- equal and bilateral Secured at: 24 cm Tube secured with: Tape Dental Injury: Teeth and Oropharynx as per pre-operative assessment

## 2019-04-20 NOTE — Transfer of Care (Signed)
Immediate Anesthesia Transfer of Care Note  Patient: Jonathon Parker  Procedure(s) Performed: LAPAROSCOPIC CHOLECYSTECTOMY (N/A Abdomen)  Patient Location: PACU  Anesthesia Type:General  Level of Consciousness: awake, alert , oriented and patient cooperative  Airway & Oxygen Therapy: Patient Spontanous Breathing and Patient connected to face mask oxygen  Post-op Assessment: Report given to RN, Post -op Vital signs reviewed and stable and Patient moving all extremities  Post vital signs: Reviewed and stable  Last Vitals:  Vitals Value Taken Time  BP 179/99 04/20/19 1537  Temp    Pulse 72 04/20/19 1539  Resp 16 04/20/19 1539  SpO2 100 % 04/20/19 1539  Vitals shown include unvalidated device data.  Last Pain:  Vitals:   04/20/19 1225  TempSrc:   PainSc: 0-No pain         Complications: No apparent anesthesia complications

## 2019-04-20 NOTE — Interval H&P Note (Signed)
History and Physical Interval Note:  04/20/2019 1:36 PM  Jonathon Parker  has presented today for surgery, with the diagnosis of CHRONIC CHOLECYSTITIS.  The various methods of treatment have been discussed with the patient and family. After consideration of risks, benefits and other options for treatment, the patient has consented to  Procedure(s): LAPAROSCOPIC CHOLECYSTECTOMY (N/A) as a surgical intervention.  The patient's history has been reviewed, patient examined, no change in status, stable for surgery.  I have reviewed the patient's chart and labs.  Questions were answered to the patient's satisfaction.     Greer Pickerel

## 2019-04-21 ENCOUNTER — Emergency Department (HOSPITAL_COMMUNITY): Payer: Medicare Other

## 2019-04-21 ENCOUNTER — Other Ambulatory Visit: Payer: Self-pay

## 2019-04-21 ENCOUNTER — Encounter (HOSPITAL_COMMUNITY): Payer: Self-pay | Admitting: General Surgery

## 2019-04-21 ENCOUNTER — Inpatient Hospital Stay (HOSPITAL_COMMUNITY)
Admission: EM | Admit: 2019-04-21 | Discharge: 2019-04-23 | DRG: 417 | Disposition: A | Discharge status: Home / Self Care | Payer: Medicare Other | Attending: Internal Medicine | Admitting: Internal Medicine

## 2019-04-21 DIAGNOSIS — R509 Fever, unspecified: Secondary | ICD-10-CM | POA: Diagnosis not present

## 2019-04-21 DIAGNOSIS — R6510 Systemic inflammatory response syndrome (SIRS) of non-infectious origin without acute organ dysfunction: Secondary | ICD-10-CM | POA: Diagnosis present

## 2019-04-21 DIAGNOSIS — K828 Other specified diseases of gallbladder: Secondary | ICD-10-CM | POA: Diagnosis present

## 2019-04-21 DIAGNOSIS — Z20828 Contact with and (suspected) exposure to other viral communicable diseases: Secondary | ICD-10-CM | POA: Diagnosis not present

## 2019-04-21 DIAGNOSIS — K649 Unspecified hemorrhoids: Secondary | ICD-10-CM | POA: Diagnosis not present

## 2019-04-21 DIAGNOSIS — Z9049 Acquired absence of other specified parts of digestive tract: Secondary | ICD-10-CM

## 2019-04-21 DIAGNOSIS — G43909 Migraine, unspecified, not intractable, without status migrainosus: Secondary | ICD-10-CM | POA: Diagnosis present

## 2019-04-21 DIAGNOSIS — Z79899 Other long term (current) drug therapy: Secondary | ICD-10-CM

## 2019-04-21 DIAGNOSIS — J9589 Other postprocedural complications and disorders of respiratory system, not elsewhere classified: Secondary | ICD-10-CM | POA: Diagnosis not present

## 2019-04-21 DIAGNOSIS — Y95 Nosocomial condition: Secondary | ICD-10-CM | POA: Diagnosis present

## 2019-04-21 DIAGNOSIS — I10 Essential (primary) hypertension: Secondary | ICD-10-CM | POA: Diagnosis not present

## 2019-04-21 DIAGNOSIS — K219 Gastro-esophageal reflux disease without esophagitis: Secondary | ICD-10-CM | POA: Diagnosis not present

## 2019-04-21 DIAGNOSIS — K8066 Calculus of gallbladder and bile duct with acute and chronic cholecystitis without obstruction: Secondary | ICD-10-CM | POA: Diagnosis not present

## 2019-04-21 DIAGNOSIS — J9601 Acute respiratory failure with hypoxia: Secondary | ICD-10-CM | POA: Diagnosis not present

## 2019-04-21 DIAGNOSIS — Z79891 Long term (current) use of opiate analgesic: Secondary | ICD-10-CM | POA: Diagnosis not present

## 2019-04-21 DIAGNOSIS — T819XXA Unspecified complication of procedure, initial encounter: Secondary | ICD-10-CM | POA: Diagnosis present

## 2019-04-21 DIAGNOSIS — R0602 Shortness of breath: Secondary | ICD-10-CM | POA: Diagnosis present

## 2019-04-21 DIAGNOSIS — R079 Chest pain, unspecified: Secondary | ICD-10-CM

## 2019-04-21 DIAGNOSIS — F32A Depression, unspecified: Secondary | ICD-10-CM | POA: Diagnosis present

## 2019-04-21 DIAGNOSIS — R1013 Epigastric pain: Secondary | ICD-10-CM

## 2019-04-21 DIAGNOSIS — C9110 Chronic lymphocytic leukemia of B-cell type not having achieved remission: Secondary | ICD-10-CM | POA: Diagnosis present

## 2019-04-21 DIAGNOSIS — F329 Major depressive disorder, single episode, unspecified: Secondary | ICD-10-CM | POA: Diagnosis not present

## 2019-04-21 DIAGNOSIS — J189 Pneumonia, unspecified organism: Secondary | ICD-10-CM | POA: Diagnosis not present

## 2019-04-21 LAB — CBC WITH DIFFERENTIAL/PLATELET
Abs Immature Granulocytes: 0.1 10*3/uL — ABNORMAL HIGH (ref 0.00–0.07)
Basophils Absolute: 0.1 10*3/uL (ref 0.0–0.1)
Basophils Relative: 0 %
Eosinophils Absolute: 0.1 10*3/uL (ref 0.0–0.5)
Eosinophils Relative: 0 %
HCT: 41.2 % (ref 39.0–52.0)
Hemoglobin: 13.3 g/dL (ref 13.0–17.0)
Immature Granulocytes: 0 %
Lymphocytes Relative: 66 %
Lymphs Abs: 20.1 10*3/uL — ABNORMAL HIGH (ref 0.7–4.0)
MCH: 32.1 pg (ref 26.0–34.0)
MCHC: 32.3 g/dL (ref 30.0–36.0)
MCV: 99.5 fL (ref 80.0–100.0)
Monocytes Absolute: 0.8 10*3/uL (ref 0.1–1.0)
Monocytes Relative: 3 %
Neutro Abs: 9.5 10*3/uL — ABNORMAL HIGH (ref 1.7–7.7)
Neutrophils Relative %: 31 %
Platelets: 149 10*3/uL — ABNORMAL LOW (ref 150–400)
RBC: 4.14 MIL/uL — ABNORMAL LOW (ref 4.22–5.81)
RDW: 13.7 % (ref 11.5–15.5)
WBC: 30.6 10*3/uL — ABNORMAL HIGH (ref 4.0–10.5)
nRBC: 0.1 % (ref 0.0–0.2)

## 2019-04-21 LAB — LACTIC ACID, PLASMA
Lactic Acid, Venous: 1.5 mmol/L (ref 0.5–1.9)
Lactic Acid, Venous: 1.4 mmol/L (ref 0.5–1.9)

## 2019-04-21 LAB — COMPREHENSIVE METABOLIC PANEL
ALT: 31 U/L (ref 0–44)
AST: 31 U/L (ref 15–41)
Albumin: 3.8 g/dL (ref 3.5–5.0)
Alkaline Phosphatase: 68 U/L (ref 38–126)
Anion gap: 10 (ref 5–15)
BUN: 13 mg/dL (ref 8–23)
CO2: 29 mmol/L (ref 22–32)
Calcium: 9 mg/dL (ref 8.9–10.3)
Chloride: 102 mmol/L (ref 98–111)
Creatinine, Ser: 1.26 mg/dL — ABNORMAL HIGH (ref 0.61–1.24)
GFR calc Af Amer: >60 mL/min (ref >60)
GFR calc non Af Amer: 58 mL/min — ABNORMAL LOW (ref >60)
Glucose, Bld: 115 mg/dL — ABNORMAL HIGH (ref 70–99)
Potassium: 4 mmol/L (ref 3.5–5.1)
Sodium: 141 mmol/L (ref 135–145)
Total Bilirubin: 1.2 mg/dL (ref 0.3–1.2)
Total Protein: 6.4 g/dL — ABNORMAL LOW (ref 6.5–8.1)

## 2019-04-21 LAB — SARS CORONAVIRUS 2 BY RT PCR (HOSPITAL ORDER, PERFORMED IN ~~LOC~~ HOSPITAL LAB): SARS Coronavirus 2: NEGATIVE

## 2019-04-21 LAB — URINALYSIS, ROUTINE W REFLEX MICROSCOPIC
Bilirubin Urine: NEGATIVE
Glucose, UA: NEGATIVE mg/dL
Hgb urine dipstick: NEGATIVE
Ketones, ur: NEGATIVE mg/dL
Leukocytes,Ua: NEGATIVE
Nitrite: NEGATIVE
Protein, ur: NEGATIVE mg/dL
Specific Gravity, Urine: 1.04 — ABNORMAL HIGH (ref 1.005–1.030)
pH: 5 (ref 5.0–8.0)

## 2019-04-21 LAB — TROPONIN I (HIGH SENSITIVITY): Troponin I (High Sensitivity): 8 ng/L (ref <18)

## 2019-04-21 LAB — LIPASE, BLOOD: Lipase: 25 U/L (ref 11–51)

## 2019-04-21 MED ORDER — SODIUM CHLORIDE 0.9% FLUSH: 3.0000 mL | Freq: Once | INTRAVENOUS | Status: DC

## 2019-04-21 MED ORDER — SODIUM CHLORIDE 0.9 % IV SOLN
2.0000 g | INTRAVENOUS | Status: AC
  Administered 2019-04-21: 2 g via INTRAVENOUS
  Filled 2019-04-21: qty 2

## 2019-04-21 MED ORDER — MORPHINE SULFATE (PF) 4 MG/ML IV SOLN
4.0000 mg | Freq: Once | INTRAVENOUS | Status: AC
  Administered 2019-04-21: 21:00:00 4 mg via INTRAVENOUS
  Filled 2019-04-21: qty 1

## 2019-04-21 MED ORDER — SODIUM CHLORIDE (PF) 0.9 % IJ SOLN
INTRAMUSCULAR | Status: AC
  Filled 2019-04-21: qty 50

## 2019-04-21 MED ORDER — MORPHINE SULFATE (PF) 2 MG/ML IV SOLN: 2.0000 mg | INTRAVENOUS | Status: DC | PRN

## 2019-04-21 MED ORDER — SODIUM CHLORIDE 0.9 % IV SOLN
INTRAVENOUS | Status: DC
  Administered 2019-04-22 – 2019-04-23 (×4): via INTRAVENOUS

## 2019-04-21 MED ORDER — LISINOPRIL 20 MG PO TABS
20.0000 mg | ORAL_TABLET | Freq: Every day | ORAL | Status: DC
  Administered 2019-04-22 – 2019-04-23 (×2): 20 mg via ORAL
  Filled 2019-04-21 (×2): qty 1

## 2019-04-21 MED ORDER — ACETAMINOPHEN 325 MG PO TABS: 650.0000 mg | ORAL_TABLET | Freq: Four times a day (QID) | ORAL | Status: DC | PRN

## 2019-04-21 MED ORDER — BUPROPION HCL ER (XL) 150 MG PO TB24
300.0000 mg | ORAL_TABLET | Freq: Every day | ORAL | Status: DC
  Administered 2019-04-22 (×2): 300 mg via ORAL
  Filled 2019-04-21 (×2): qty 2

## 2019-04-21 MED ORDER — ALPRAZOLAM 0.25 MG PO TABS: 0.2500 mg | ORAL_TABLET | Freq: Every evening | ORAL | Status: DC | PRN

## 2019-04-21 MED ORDER — PANTOPRAZOLE SODIUM 40 MG PO TBEC
80.0000 mg | DELAYED_RELEASE_TABLET | Freq: Every day | ORAL | Status: DC
  Administered 2019-04-22 – 2019-04-23 (×2): 80 mg via ORAL
  Filled 2019-04-21 (×2): qty 2

## 2019-04-21 MED ORDER — IOHEXOL 350 MG/ML SOLN
100.0000 mL | Freq: Once | INTRAVENOUS | Status: AC | PRN
  Administered 2019-04-21: 100 mL via INTRAVENOUS

## 2019-04-21 MED ORDER — MORPHINE SULFATE (PF) 2 MG/ML IV SOLN
2.0000 mg | INTRAVENOUS | Status: DC | PRN
  Administered 2019-04-22: 4 mg via INTRAVENOUS
  Administered 2019-04-22: 2 mg via INTRAVENOUS
  Filled 2019-04-21: qty 2
  Filled 2019-04-21: qty 1

## 2019-04-21 MED ORDER — ONDANSETRON HCL 4 MG/2ML IJ SOLN
4.0000 mg | Freq: Once | INTRAMUSCULAR | Status: AC
  Administered 2019-04-21: 4 mg via INTRAVENOUS
  Filled 2019-04-21: qty 2

## 2019-04-21 MED ORDER — TRAZODONE HCL 100 MG PO TABS
100.0000 mg | ORAL_TABLET | Freq: Every day | ORAL | Status: DC
  Administered 2019-04-22 (×2): 100 mg via ORAL
  Filled 2019-04-21 (×2): qty 1

## 2019-04-21 MED ORDER — ENOXAPARIN SODIUM 40 MG/0.4ML ~~LOC~~ SOLN
40.0000 mg | Freq: Every day | SUBCUTANEOUS | Status: DC
  Administered 2019-04-22 – 2019-04-23 (×2): 40 mg via SUBCUTANEOUS
  Filled 2019-04-21 (×2): qty 0.4

## 2019-04-21 MED ORDER — VANCOMYCIN HCL 10 G IV SOLR
2000.0000 mg | Freq: Once | INTRAVENOUS | Status: AC
  Administered 2019-04-22: 2000 mg via INTRAVENOUS
  Filled 2019-04-21: qty 2000

## 2019-04-21 NOTE — ED Notes (Signed)
Patient transported to CT 

## 2019-04-21 NOTE — ED Notes (Signed)
ED TO INPATIENT HANDOFF REPORT  ED Nurse Name and Phone #: Chibuike Fleek x4  S Name/Age/Gender Bascom Levels 68 y.o. male Room/Bed: WA21/WA21  Code Status   Code Status: Full Code  Home/SNF/Other Home Patient oriented to: self, place, time and situation Is this baseline? Yes   Triage Complete: Triage complete  Chief Complaint Fever, Body aches  Triage Note Pt reports he had his gallbladder out yesterday and today started getting a fever and it hurts to take a deep breathe.  Patient is alert and oriented and took tylenol to bring down his fever.      Allergies No Known Allergies  Level of Care/Admitting Diagnosis ED Disposition    ED Disposition Condition Comment   Admit  Hospital Area: [facility] [[id number]]  Level of Care: Med-Surg [16]  Covid Evaluation: Confirmed COVID Negative  Diagnosis: HCAP (healthcare-associated pneumonia) [id number]  Admitting Physician: Doreatha Massed  Attending Physician: Etta Quill [phone]  Estimated length of stay: past midnight tomorrow  Certification:: I certify this patient will need inpatient services for at least 2 midnights  PT Class (Do Not Modify): Inpatient [101]  PT Acc Code (Do Not Modify): Private [1]       B Medical/Surgery History Past Medical History:  Diagnosis Date  . Cholecystitis   . 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 Karle Starch in [facility] hospital    . Depression   . GERD (gastroesophageal reflux disease)    controlled with nexium   . Hypertension    Past Surgical History:  Procedure Laterality Date  . APPENDECTOMY    . CARPAL TUNNEL RELEASE     left   . CHOLECYSTECTOMY N/A 04/20/2019   Procedure: LAPAROSCOPIC CHOLECYSTECTOMY;  Surgeon: Greer Pickerel, MD;  Location: [facility];  Service: General;  Laterality: N/A;  . COLON SURGERY  2002   10 inches of colon taken out   . COLONOSCOPY    . ENDOSCOPIC RETROGRADE CHOLANGIOPANCREATOGRAPHY (ERCP) WITH  PROPOFOL N/A 03/01/2019   Procedure: ENDOSCOPIC RETROGRADE CHOLANGIOPANCREATOGRAPHY (ERCP) WITH PROPOFOL;  Surgeon: Rush Landmark Telford Nab., MD;  Location: [facility];  Service: Gastroenterology;  Laterality: N/A;  . ERCP  03/01/2019  . HERNIA REPAIR     bilateral inguinal   . IR CHOLANGIOGRAM EXISTING TUBE  03/25/2019  . IR PERC CHOLECYSTOSTOMY  03/02/2019  . REMOVAL OF STONES  03/01/2019   Procedure: REMOVAL OF STONES;  Surgeon: Rush Landmark Telford Nab., MD;  Location: [facility];  Service: Gastroenterology;;  . [name]  03/01/2019   Procedure: [name];  Surgeon: Mansouraty, Telford Nab., MD;  Location: [facility];  Service: Gastroenterology;;     A IV Location/Drains/Wounds Patient Lines/Drains/Airways Status   Active Line/Drains/Airways    Name:   Placement date:   Placement time:   Site:   Days:   Peripheral IV 04/21/19 Left Antecubital   04/21/19    2039    Antecubital   less than 1   Incision (Closed) 04/20/19 Abdomen Other (Comment)   04/20/19    1517     1   Incision - 4 Ports Abdomen Right;Lateral Right;Medial Umbilicus Left;Mid;Upper   04/20/19    1416     1          Intake/Output Last 24 hours No intake or output data in the 24 hours ending 04/21/19 2359  Labs/Imaging Results for orders placed or performed during the hospital encounter of 04/21/19 (from the past 48 hour(s))  Lactic acid, plasma  Status: None   Collection Time: 04/21/19  7:42 PM  Result Value Ref Range   Lactic Acid, Venous 1.5 0.5 - 1.9 mmol/L    Comment: Performed at [facility], [place] [address], [place], [place] [address]  Comprehensive metabolic panel     Status: Abnormal   Collection Time: 04/21/19  7:42 PM  Result Value Ref Range   Sodium 141 135 - 145 mmol/L   Potassium 4.0 3.5 - 5.1 mmol/L   Chloride 102 98 - 111 mmol/L   CO2 29 22 - 32 mmol/L   Glucose, Bld 115 (H) 70 - 99 mg/dL   BUN 13 8 - 23 mg/dL   Creatinine, Ser 1.26 (H) 0.61 - 1.24 mg/dL    Calcium 9.0 8.9 - 10.3 mg/dL   Total Protein 6.4 (L) 6.5 - 8.1 g/dL   Albumin 3.8 3.5 - 5.0 g/dL   AST 31 15 - 41 U/L   ALT 31 0 - 44 U/L   Alkaline Phosphatase 68 38 - 126 U/L   Total Bilirubin 1.2 0.3 - 1.2 mg/dL   GFR calc non Af Amer 58 (L) >60 mL/min   GFR calc Af Amer >60 >60 mL/min   Anion gap 10 5 - 15    Comment: Performed at [facility], [place] [address], [place], [place] [address]  CBC with Differential     Status: Abnormal   Collection Time: 04/21/19  7:42 PM  Result Value Ref Range   WBC 30.6 (H) 4.0 - 10.5 K/uL   RBC 4.14 (L) 4.22 - 5.81 MIL/uL   Hemoglobin 13.3 13.0 - 17.0 g/dL   HCT 41.2 39.0 - 52.0 %   MCV 99.5 80.0 - 100.0 fL   MCH 32.1 26.0 - 34.0 pg   MCHC 32.3 30.0 - 36.0 g/dL   RDW 13.7 11.5 - 15.5 %   Platelets 149 (L) 150 - 400 K/uL   nRBC 0.1 0.0 - 0.2 %   Neutrophils Relative % 31 %   Neutro Abs 9.5 (H) 1.7 - 7.7 K/uL   Lymphocytes Relative 66 %   Lymphs Abs 20.1 (H) 0.7 - 4.0 K/uL   Monocytes Relative 3 %   Monocytes Absolute 0.8 0.1 - 1.0 K/uL   Eosinophils Relative 0 %   Eosinophils Absolute 0.1 0.0 - 0.5 K/uL   Basophils Relative 0 %   Basophils Absolute 0.1 0.0 - 0.1 K/uL   Immature Granulocytes 0 %   Abs Immature Granulocytes 0.10 (H) 0.00 - 0.07 K/uL   Smudge Cells PRESENT     Comment: Performed at [facility], [place] [address], [place], [place] [address]  Lactic acid, plasma     Status: None   Collection Time: 04/21/19  8:46 PM  Result Value Ref Range   Lactic Acid, Venous 1.4 0.5 - 1.9 mmol/L    Comment: Performed at [facility], [place] [address] [place], [place] [address]  Urinalysis, Routine w reflex microscopic     Status: Abnormal   Collection Time: 04/21/19  8:46 PM  Result Value Ref Range   Color, Urine YELLOW YELLOW   APPearance CLEAR CLEAR   Specific Gravity, Urine 1.040 (H) 1.005 - 1.030   pH 5.0 5.0 - 8.0   Glucose, UA NEGATIVE NEGATIVE mg/dL   Hgb urine dipstick  NEGATIVE NEGATIVE   Bilirubin Urine NEGATIVE NEGATIVE   Ketones, ur NEGATIVE NEGATIVE mg/dL   Protein, ur NEGATIVE NEGATIVE mg/dL   Nitrite NEGATIVE NEGATIVE  Leukocytes,Ua NEGATIVE NEGATIVE    Comment: Performed at [facility], [place] [address], [place], [place] [address]  Lipase, blood     Status: None   Collection Time: 04/21/19  8:46 PM  Result Value Ref Range   Lipase 25 11 - 51 U/L    Comment: Performed at [facility], [place] [address] [place], [place] [address]  Troponin I (High Sensitivity)     Status: None   Collection Time: 04/21/19  8:46 PM  Result Value Ref Range   Troponin I (High Sensitivity) 8 <18 ng/L    Comment: (NOTE) Elevated high sensitivity troponin I (hsTnI) values and significant  changes across serial measurements may suggest ACS but many other  chronic and acute conditions are known to elevate hsTnI results.  Refer to the "Links" section for chest pain algorithms and additional  guidance. Performed at [facility], [place] [address], [place],  [address]   SARS Coronavirus 2 [facility] order, Performed in [facility] hospital lab) Nasopharyngeal Nasopharyngeal Swab     Status: None   Collection Time: 04/21/19  8:58 PM   Specimen: Nasopharyngeal Swab  Result Value Ref Range   SARS Coronavirus 2 NEGATIVE NEGATIVE    Comment: (NOTE) If result is NEGATIVE SARS-CoV-2 target nucleic acids are NOT DETECTED. The SARS-CoV-2 RNA is generally detectable in upper and lower  respiratory specimens during the acute phase of infection. The lowest  concentration of SARS-CoV-2 viral copies this assay can detect is 250  copies / mL. A negative result does not preclude SARS-CoV-2 infection  and should not be used as the sole basis for treatment or other  patient management decisions.  A negative result may occur with  improper specimen collection / handling, submission of specimen other  than nasopharyngeal swab,  presence of viral mutation(s) within the  areas targeted by this assay, and inadequate number of viral copies  (<250 copies / mL). A negative result must be combined with clinical  observations, patient history, and epidemiological information. If result is POSITIVE SARS-CoV-2 target nucleic acids are DETECTED. The SARS-CoV-2 RNA is generally detectable in upper and lower  respiratory specimens dur ing the acute phase of infection.  Positive  results are indicative of active infection with SARS-CoV-2.  Clinical  correlation with patient history and other diagnostic information is  necessary to determine patient infection status.  Positive results do  not rule out bacterial infection or co-infection with other viruses. If result is PRESUMPTIVE POSTIVE SARS-CoV-2 nucleic acids MAY BE PRESENT.   A presumptive positive result was obtained on the submitted specimen  and confirmed on repeat testing.  While 2019 novel coronavirus  (SARS-CoV-2) nucleic acids may be present in the submitted sample  additional confirmatory testing may be necessary for epidemiological  and / or clinical management purposes  to differentiate between  SARS-CoV-2 and other Sarbecovirus currently known to infect humans.  If clinically indicated additional testing with an alternate test  methodology [phone]) is advised. The SARS-CoV-2 RNA is generally  detectable in upper and lower respiratory sp ecimens during the acute  phase of infection. The expected result is Negative. Fact Sheet for Patients:  StrictlyIdeas.no Fact Sheet for Healthcare Providers: BankingDealers.co.za This test is not yet approved or cleared by the [place] FDA and has been authorized for detection and/or diagnosis of SARS-CoV-2 by FDA under an Emergency Use Authorization (EUA).  This EUA will remain in effect (meaning this test can be used) for the duration of  the COVID-19 declaration under  Section 564(b)(1) of the Act, 21 U.S.C. section 360bbb-3(b)(1), unless the authorization is terminated or revoked sooner. Performed at [facility], [place] [address], [place], [place] [address]    Dg Chest 2 View  Result Date: 04/21/2019 CLINICAL DATA:  Postop fever, cholecystectomy 1 day prior EXAM: CHEST - 2 VIEW COMPARISON:  Radiograph 02/27/2011 FINDINGS: Chronic elevation of the right hemidiaphragm. Streaky opacities in the right lung base most suggestive of postoperative atelectasis. No consolidation, features of edema, pneumothorax, or effusion. Pulmonary vascularity is normally distributed. The cardiomediastinal contours are unremarkable. No acute osseous or soft tissue abnormality. No convincing evidence of free subdiaphragmatic air. IMPRESSION: Streaky opacities in the right lung base most suggestive of postoperative atelectasis. No other acute cardiopulmonary disease. Electronically Signed   By: Lovena Le M.D.   On: 04/21/2019 20:11   Ct Angio Chest Pe W And/or Wo Contrast  Result Date: 04/21/2019 CLINICAL DATA:  68 year old male with lower chest pain, upper abdominal pain. Postoperative day 1 cholecystectomy. Chronic lymphocytic leukemia. EXAM: CT ANGIOGRAPHY CHEST WITH CONTRAST TECHNIQUE: Multidetector CT imaging of the chest was performed using the standard protocol during bolus administration of intravenous contrast. Multiplanar CT image reconstructions and MIPs were obtained to evaluate the vascular anatomy. CONTRAST:  116mL OMNIPAQUE IOHEXOL 350 MG/ML SOLN COMPARISON:  CT Abdomen and Pelvis today are reported separately. Chest radiographs earlier today. FINDINGS: Cardiovascular: Good contrast bolus timing in the pulmonary arterial tree. Respiratory motion. No convincing No focal filling defect identified in the pulmonary arteries to suggest acute pulmonary embolism. Negative visible aorta. Cardiac size within effusion normal limits. No pericardial. Mediastinum/Nodes:  Negative. Lungs/Pleura: Elevated right hemidiaphragm. Small to moderate layering right pleural effusion. Adjacent enhancing right lung atelectasis. Atelectatic changes to the major airways which otherwise appear patent. Mild enhancing atelectasis in the dependent left lower lobe. No other pulmonary process. Upper Abdomen: Reported separately today. Musculoskeletal: No acute osseous abnormality identified. Review of the MIP images confirms the above findings. IMPRESSION: 1. Respiratory motion but good contrast bolus timing with no convincing pulmonary embolus. 2. Elevated right hemidiaphragm with small to moderate layering right pleural effusion and right greater than left lung atelectasis. 3. See also CT Abdomen and Pelvis reported separately. Electronically Signed   By: Genevie Ann M.D.   On: 04/21/2019 21:34   Ct Abdomen Pelvis W Contrast  Result Date: 04/21/2019 CLINICAL DATA:  68 year old male with lower chest pain, upper abdominal pain. Postoperative day 1 cholecystectomy. Chronic lymphocytic leukemia. EXAM: CT ABDOMEN AND PELVIS WITH CONTRAST TECHNIQUE: Multidetector CT imaging of the abdomen and pelvis was performed using the standard protocol following bolus administration of intravenous contrast. CONTRAST:  120mL OMNIPAQUE IOHEXOL 350 MG/ML SOLN COMPARISON:  CTA chest today reported separately. CT Abdomen and Pelvis 01/16/2017. FINDINGS: Lower chest: Reported separately today. Elevation of the right hemidiaphragm is increased since 2018. Hepatobiliary: Ventral abdominal wall incision with no adverse features. Trace gas in the left rectus muscle. Small volume perihepatic fluid with simple fluid density. Regional mild inflammatory stranding. Surgical clips in the gallbladder fossa where a small volume of fluid and trace gas is present. Mild pneumobilia, mostly non dependent in the left lobe. More moderate volume gas in the CBD. The CBD measures 8-9 millimeters, with no stone or other filling defect identified  along its course. Pancreas: Pancreas remains within normal limits. Spleen: Negative. Adrenals/Urinary Tract: Normal adrenal glands. Bilateral renal enhancement and contrast excretion is symmetric and normal. Normal proximal ureters. No nephrolithiasis. Decompressed ureters. Unremarkable  urinary bladder. Stomach/Bowel: Chronic rectosigmoid anastomosis with a nearby surgical clip is stable since 2018 with no adverse features. Mildly redundant but otherwise negative sigmoid. Negative descending colon. Redundant splenic flexure. The hepatic flexure remains within normal limits despite some regional inflammation. Negative right colon. Prior appendectomy, stable. Negative terminal ileum. No dilated small bowel. Mostly decompressed stomach and duodenum. There is a small volume of fluid in the gastrohepatic ligament and adjacent to the distal esophagus on series 4, image 15. There is evidence of a 2 centimeter duodenal diverticulum on coronal image 39. Trace pneumoperitoneum including between the left lobe of the liver and the diaphragm. Vascular/Lymphatic: Mild Aortoiliac calcified atherosclerosis. Major arterial structures are patent. Portal venous system is patent. Reproductive: Lower inguinal/space of Retzius prior hernia repair with mesh is stable since 2018. Otherwise negative. Other: Small volume free fluid in the right hemipelvis with slightly complex fluid density. There is also an associated small layering 4 millimeter calculus in the right hemipelvis on series 4, image 86. Musculoskeletal: Lower lumbar spine degeneration. No acute osseous abnormality identified. IMPRESSION: 1. Recent cholecystectomy with satisfactory appearance of the surgical bed. Small volume perihepatic fluid.  Trace pneumoperitoneum. Pneumobilia, with 8-9 mm diameter CBD, but no other CBD filling defect. Small volume of mildly complex fluid layering in the pelvis along with a 4 mm calculus which might be a dropped gallstone (series 4, image  86). Ventral abdominal incision with no adverse features. 2. No other acute or inflammatory process in the abdomen or pelvis. 3. Lower chest findings reported with chest CTA today separately. Electronically Signed   By: Genevie Ann M.D.   On: 04/21/2019 21:44    Pending Labs Unresulted Labs (From admission, onward)    Start     Ordered   04/22/19 0500  CBC  Tomorrow morning,   R     04/21/19 2307   04/22/19 0500  Comprehensive metabolic panel  Tomorrow morning,   R     04/21/19 2307   04/21/19 2305  HIV antibody (Routine Screening)  Once,   STAT     04/21/19 2307   04/21/19 2254  MRSA PCR Screening  Once,   STAT     04/21/19 2254   04/21/19 2225  Urine culture  ONCE - STAT,   STAT     04/21/19 2224   04/21/19 2013  Blood culture (routine x 2)  BLOOD CULTURE X 2,   STAT     04/21/19 2014          Vitals/Pain Today's Vitals   04/21/19 2230 04/21/19 2300 04/21/19 2330 04/21/19 2346  BP: (!) 154/90 (!) 150/82 (!) 156/85   Pulse: 70  73   Resp: (!) 27 (!) 25 19   Temp:      TempSrc:      SpO2: 96%  95%   PainSc:    7     Isolation Precautions No active isolations  Medications Medications  sodium chloride flush (NS) 0.9 % injection 3 mL (has no administration in time range)  sodium chloride (PF) 0.9 % injection (has no administration in time range)  ceFEPIme (MAXIPIME) 2 g in sodium chloride 0.9 % 100 mL IVPB (2 g Intravenous New Bag/Given 04/21/19 2340)  vancomycin (VANCOCIN) 2,000 mg in sodium chloride 0.9 % 500 mL IVPB (has no administration in time range)  enoxaparin (LOVENOX) injection 40 mg (has no administration in time range)  0.9 %  sodium chloride infusion (has no administration in time range)  ALPRAZolam (XANAX) tablet  0.25 mg (has no administration in time range)  buPROPion (WELLBUTRIN XL) 24 hr tablet 300 mg (has no administration in time range)  lisinopril (ZESTRIL) tablet 20 mg (has no administration in time range)  pantoprazole (PROTONIX) EC tablet 80 mg (has no  administration in time range)  traZODone (DESYREL) tablet 100 mg (has no administration in time range)  acetaminophen (TYLENOL) tablet 650 mg (has no administration in time range)  morphine 2 MG/ML injection 2-4 mg (has no administration in time range)  morphine 4 MG/ML injection 4 mg (4 mg Intravenous Given 04/21/19 2057)  ondansetron (ZOFRAN) injection 4 mg (4 mg Intravenous Given 04/21/19 2054)  iohexol (OMNIPAQUE) 350 MG/ML injection 100 mL (100 mLs Intravenous Contrast Given 04/21/19 2107)    Mobility walks Low fall risk   Focused Assessments NA   R Recommendations: See Admitting Provider Note  Report given to:   Additional Notes: NA

## 2019-04-21 NOTE — ED Provider Notes (Signed)
[facility] DEPT Provider Note   CSN: [id number] Arrival date & time: 04/21/19  1843     History   Chief Complaint Chief Complaint  Patient presents with   Fever   Post-op Problem    HPI Jonathon Parker is a 68 y.o. male.     The history is provided by the patient and medical records. No language interpreter was used.  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 Karle Starch in [facility]     Depression    GERD (gastroesophageal reflux disease)    controlled with nexium    Hypertension     Patient Active Problem List   Diagnosis Date Noted   Calculous cholecystitis 02/28/2019   Campylobacter diarrhea    Diarrhea of infectious origin    STEC (Shiga toxin-producing Escherichia coli)    Elevated LFTs    Hypokalemia    Gastroenteritis 01/19/2017   Sepsis ([employer]) 01/19/2017   GERD (gastroesophageal reflux disease) 01/19/2017   Depression 01/19/2017    Past Surgical History:  Procedure Laterality Date   APPENDECTOMY     CARPAL TUNNEL RELEASE     left    CHOLECYSTECTOMY N/A 04/20/2019   Procedure: LAPAROSCOPIC CHOLECYSTECTOMY;  Surgeon: Greer Pickerel, MD;  Location: [facility];  Service: General;  Laterality: N/A;   COLON SURGERY  2002   10 inches of colon taken out    COLONOSCOPY     ENDOSCOPIC  RETROGRADE CHOLANGIOPANCREATOGRAPHY (ERCP) WITH PROPOFOL N/A 03/01/2019   Procedure: ENDOSCOPIC RETROGRADE CHOLANGIOPANCREATOGRAPHY (ERCP) WITH PROPOFOL;  Surgeon: [name], MD;  Location: [place];  Service: Gastroenterology;  Laterality: N/A;   ERCP  03/01/2019   HERNIA REPAIR     bilateral inguinal    IR CHOLANGIOGRAM EXISTING TUBE  03/25/2019   IR PERC CHOLECYSTOSTOMY  03/02/2019   REMOVAL OF STONES  03/01/2019   Procedure: REMOVAL OF STONES;  Surgeon: [name], MD;  Location: [place];  Service: Gastroenterology;;   [name]  03/01/2019   Procedure: [name];  Surgeon: [name], MD;  Location: [place];  Service: Gastroenterology;;        Home Medications    Prior to Admission medications   Medication Sig Start Date End Date Taking? Authorizing Provider  acetaminophen (TYLENOL) 325 MG tablet Take 2 tablets (650 mg total) by mouth every 6 (six) hours as needed for mild pain (or temp > 100). 03/07/19   Earnstine Regal, PA-C  ALPRAZolam Duanne Moron) 0.25 MG tablet Take 0.25 mg by mouth at bedtime as needed for anxiety.    [provider]  buPROPion (WELLBUTRIN XL) 300 MG 24 hr tablet Take 300 mg by mouth at bedtime.    [provider]  esomeprazole (NEXIUM) 40 MG capsule Take 40 mg by mouth daily.     [provider]  lisinopril (ZESTRIL) 20 MG tablet Take 20 mg  by mouth daily.    [provider]  oxyCODONE (OXY IR/ROXICODONE) 5 MG immediate release tablet Take 1 tablet (5 mg total) by mouth every 6 (six) hours as needed for severe pain. 04/20/19   Greer Pickerel, MD  traZODone (DESYREL) 100 MG tablet Take 100 mg by mouth at bedtime.    [provider]  triamcinolone cream (KENALOG) 0.1 % Apply 1 application topically daily as needed (rash).    [provider]    Family History Family History  Problem Relation Age of Onset   Colon cancer Neg Hx    Esophageal cancer  Neg Hx     Social History Social History   Tobacco Use   Smoking status: Never Smoker   Smokeless tobacco: Never Used  Substance Use Topics   Alcohol use: Yes    Comment: 1 drink before supper   Drug use: No     Allergies   Patient has no known allergies.   Review of Systems Review of Systems  Constitutional: Positive for chills, fatigue and fever. Negative for diaphoresis.  HENT: Negative for congestion.   Eyes: Negative for visual disturbance.  Respiratory: Positive for chest tightness and shortness of breath. Negative for cough, choking, wheezing and stridor.   Cardiovascular: Positive for chest pain. Negative for palpitations and leg swelling.  Gastrointestinal: Positive for abdominal pain and nausea. Negative for abdominal distention, constipation, diarrhea, melena and vomiting.  Genitourinary: Negative for dysuria and flank pain.  Musculoskeletal: Negative for back pain, neck pain and neck stiffness.  Skin: Positive for wound (surgical wounds).  Neurological: Negative for light-headedness and headaches.  All other systems reviewed and are negative.    Physical Exam Updated Vital Signs BP (!) 168/99 (BP Location: Right Arm)    Pulse 88    Temp 99.4 F (37.4 C) (Oral)    Resp 18    SpO2 94%   Physical Exam Vitals signs and nursing note reviewed.  Constitutional:      General: He is not in acute distress.    Appearance: He is well-developed. He is ill-appearing. He is not toxic-appearing or diaphoretic.  HENT:     Head: Normocephalic and atraumatic.     Nose: Nose normal. No congestion or rhinorrhea.     Mouth/Throat:     Mouth: Mucous membranes are moist.     Pharynx: No oropharyngeal exudate or posterior oropharyngeal erythema.  Eyes:     Extraocular Movements: Extraocular movements intact.     Conjunctiva/sclera: Conjunctivae normal.     Pupils: Pupils are equal, round, and reactive to light.  Neck:     Musculoskeletal: Neck supple. No muscular  tenderness.  Cardiovascular:     Rate and Rhythm: Normal rate and regular rhythm.     Pulses: Normal pulses.     Heart sounds: No murmur.  Pulmonary:     Effort: Tachypnea present. No respiratory distress.     Breath sounds: No stridor. No decreased breath sounds, wheezing, rhonchi or rales.  Chest:     Chest wall: Tenderness present.  Abdominal:     General: Abdomen is flat. Bowel sounds are decreased. There is no distension.     Palpations: Abdomen is soft.     Tenderness: There is abdominal tenderness. There is no right CVA tenderness, left CVA tenderness, guarding or rebound.    Skin:    General: Skin is warm and dry.  Neurological:     Mental Status: He is alert.      ED Treatments /  Results  Labs (all labs ordered are listed, but only abnormal results are displayed) Labs Reviewed  COMPREHENSIVE METABOLIC PANEL - Abnormal; Notable for the following components:      Result Value   Glucose, Bld 115 (*)    Creatinine, Ser 1.26 (*)    Total Protein 6.4 (*)    GFR calc non Af Amer 58 (*)    All other components within normal limits  CBC WITH DIFFERENTIAL/PLATELET - Abnormal; Notable for the following components:   WBC 30.6 (*)    RBC 4.14 (*)    Platelets 149 (*)    Neutro Abs 9.5 (*)    Lymphs Abs 20.1 (*)    Abs Immature Granulocytes 0.10 (*)    All other components within normal limits  SARS CORONAVIRUS 2 (HOSPITAL ORDER, [facility])  CULTURE, BLOOD (ROUTINE X 2)  CULTURE, BLOOD (ROUTINE X 2)  URINE CULTURE  MRSA PCR SCREENING  LACTIC ACID, PLASMA  LACTIC ACID, PLASMA  LIPASE, BLOOD  URINALYSIS, ROUTINE W REFLEX MICROSCOPIC  TROPONIN I (HIGH SENSITIVITY)  TROPONIN I (HIGH SENSITIVITY)    EKG None ED ECG REPORT   Date: 04/21/2019  Rate: 74  Rhythm: normal sinus rhythm  QRS Axis: left  Intervals: normal  ST/T Wave abnormalities: indeterminate  Conduction Disutrbances:none  Narrative Interpretation:   Old EKG Reviewed:  unchanged  I have personally reviewed the EKG tracing and agree with the computerized printout as noted.    Radiology Dg Chest 2 View  Result Date: 04/21/2019 CLINICAL DATA:  Postop fever, cholecystectomy 1 day prior EXAM: CHEST - 2 VIEW COMPARISON:  Radiograph 02/27/2011 FINDINGS: Chronic elevation of the right hemidiaphragm. Streaky opacities in the right lung base most suggestive of postoperative atelectasis. No consolidation, features of edema, pneumothorax, or effusion. Pulmonary vascularity is normally distributed. The cardiomediastinal contours are unremarkable. No acute osseous or soft tissue abnormality. No convincing evidence of free subdiaphragmatic air. IMPRESSION: Streaky opacities in the right lung base most suggestive of postoperative atelectasis. No other acute cardiopulmonary disease. Electronically Signed   By: Lovena Le M.D.   On: 04/21/2019 20:11   Ct Angio Chest Pe W And/or Wo Contrast  Result Date: 04/21/2019 CLINICAL DATA:  68 year old male with lower chest pain, upper abdominal pain. Postoperative day 1 cholecystectomy. Chronic lymphocytic leukemia. EXAM: CT ANGIOGRAPHY CHEST WITH CONTRAST TECHNIQUE: Multidetector CT imaging of the chest was performed using the standard protocol during bolus administration of intravenous contrast. Multiplanar CT image reconstructions and MIPs were obtained to evaluate the vascular anatomy. CONTRAST:  126mL OMNIPAQUE IOHEXOL 350 MG/ML SOLN COMPARISON:  CT Abdomen and Pelvis today are reported separately. Chest radiographs earlier today. FINDINGS: Cardiovascular: Good contrast bolus timing in the pulmonary arterial tree. Respiratory motion. No convincing No focal filling defect identified in the pulmonary arteries to suggest acute pulmonary embolism. Negative visible aorta. Cardiac size within effusion normal limits. No pericardial. Mediastinum/Nodes: Negative. Lungs/Pleura: Elevated right hemidiaphragm. Small to moderate layering right pleural  effusion. Adjacent enhancing right lung atelectasis. Atelectatic changes to the major airways which otherwise appear patent. Mild enhancing atelectasis in the dependent left lower lobe. No other pulmonary process. Upper Abdomen: Reported separately today. Musculoskeletal: No acute osseous abnormality identified. Review of the MIP images confirms the above findings. IMPRESSION: 1. Respiratory motion but good contrast bolus timing with no convincing pulmonary embolus. 2. Elevated right hemidiaphragm with small to moderate layering right pleural effusion and right greater than left lung atelectasis. 3. See also CT Abdomen and Pelvis reported  separately. Electronically Signed   By: Genevie Ann M.D.   On: 04/21/2019 21:34   Ct Abdomen Pelvis W Contrast  Result Date: 04/21/2019 CLINICAL DATA:  68 year old male with lower chest pain, upper abdominal pain. Postoperative day 1 cholecystectomy. Chronic lymphocytic leukemia. EXAM: CT ABDOMEN AND PELVIS WITH CONTRAST TECHNIQUE: Multidetector CT imaging of the abdomen and pelvis was performed using the standard protocol following bolus administration of intravenous contrast. CONTRAST:  110mL OMNIPAQUE IOHEXOL 350 MG/ML SOLN COMPARISON:  CTA chest today reported separately. CT Abdomen and Pelvis 01/16/2017. FINDINGS: Lower chest: Reported separately today. Elevation of the right hemidiaphragm is increased since 2018. Hepatobiliary: Ventral abdominal wall incision with no adverse features. Trace gas in the left rectus muscle. Small volume perihepatic fluid with simple fluid density. Regional mild inflammatory stranding. Surgical clips in the gallbladder fossa where a small volume of fluid and trace gas is present. Mild pneumobilia, mostly non dependent in the left lobe. More moderate volume gas in the CBD. The CBD measures 8-9 millimeters, with no stone or other filling defect identified along its course. Pancreas: Pancreas remains within normal limits. Spleen: Negative.  Adrenals/Urinary Tract: Normal adrenal glands. Bilateral renal enhancement and contrast excretion is symmetric and normal. Normal proximal ureters. No nephrolithiasis. Decompressed ureters. Unremarkable urinary bladder. Stomach/Bowel: Chronic rectosigmoid anastomosis with a nearby surgical clip is stable since 2018 with no adverse features. Mildly redundant but otherwise negative sigmoid. Negative descending colon. Redundant splenic flexure. The hepatic flexure remains within normal limits despite some regional inflammation. Negative right colon. Prior appendectomy, stable. Negative terminal ileum. No dilated small bowel. Mostly decompressed stomach and duodenum. There is a small volume of fluid in the gastrohepatic ligament and adjacent to the distal esophagus on series 4, image 15. There is evidence of a 2 centimeter duodenal diverticulum on coronal image 39. Trace pneumoperitoneum including between the left lobe of the liver and the diaphragm. Vascular/Lymphatic: Mild Aortoiliac calcified atherosclerosis. Major arterial structures are patent. Portal venous system is patent. Reproductive: Lower inguinal/space of Retzius prior hernia repair with mesh is stable since 2018. Otherwise negative. Other: Small volume free fluid in the right hemipelvis with slightly complex fluid density. There is also an associated small layering 4 millimeter calculus in the right hemipelvis on series 4, image 86. Musculoskeletal: Lower lumbar spine degeneration. No acute osseous abnormality identified. IMPRESSION: 1. Recent cholecystectomy with satisfactory appearance of the surgical bed. Small volume perihepatic fluid.  Trace pneumoperitoneum. Pneumobilia, with 8-9 mm diameter CBD, but no other CBD filling defect. Small volume of mildly complex fluid layering in the pelvis along with a 4 mm calculus which might be a dropped gallstone (series 4, image 86). Ventral abdominal incision with no adverse features. 2. No other acute or  inflammatory process in the abdomen or pelvis. 3. Lower chest findings reported with chest CTA today separately. Electronically Signed   By: Genevie Ann M.D.   On: 04/21/2019 21:44    Procedures Procedures (including critical care time)  Medications Ordered in ED Medications  sodium chloride flush (NS) 0.9 % injection 3 mL (has no administration in time range)  sodium chloride (PF) 0.9 % injection (has no administration in time range)  ceFEPIme (MAXIPIME) 2 g in sodium chloride 0.9 % 100 mL IVPB (has no administration in time range)  vancomycin (VANCOCIN) 2,000 mg in sodium chloride 0.9 % 500 mL IVPB (has no administration in time range)  morphine 4 MG/ML injection 4 mg (4 mg Intravenous Given 04/21/19 2057)  ondansetron (ZOFRAN) injection 4 mg (  4 mg Intravenous Given 04/21/19 2054)  iohexol (OMNIPAQUE) 350 MG/ML injection 100 mL (100 mLs Intravenous Contrast Given 04/21/19 2107)     Initial Impression / Assessment and Plan / ED Course  I have reviewed the triage vital signs and the nursing notes.  Pertinent labs & imaging results that were available during my care of the patient were reviewed by me and considered in my medical decision making (see chart for details).        Jonathon Parker is a 68 y.o. male with a past medical history significant for CLL, hypertension, GERD, and recent cholecystectomy yesterday who presents with fever up to 102.9, nausea, tachypnea, shortness of breath, chest pain, and abdominal pain.  Patient reports that he was discharged yesterday around 7 PM and overnight woke up with pain across his lower chest and upper abdomen.  He reports he has taken ibuprofen and Tylenol today without significant relief.  He did not take other pain medicine at home.  He reports he developed a fever of 102.9 and was nauseous.  He reports he has not had a bowel movement but he does think he has passed gas.  He denies any urinary symptoms.  He reports that he has progressive worsening  shortness of breath with fast breathing.  The pain across his lower chest and upper abdomen is very pleuritic and he is concerned about his diaphragm.  He denies history of DVT or PE but he does have the CLL.  On exam, patient is tachypneic and tender across his upper abdomen and lower chest.  Breath sounds were slightly coarse in the bases bilaterally.  No murmur.  Patient has surgical wounds that are still covered and dressed.  No purulence or bleeding present.  EKG shows sinus rhythm with no STEMI.  Clinically I am concerned about postoperative problem such as infection versus pulmonary embolism given his hypercoagulable state with CLL and his recent surgery.  Patient will given pain medicine, nausea medicine, and will have work-up including imaging of the abdomen/pelvis for the postoperative site as well as a PE study for the chest.  Patient is on room air on arrival.  Patient appears uncomfortable and is warm to the touch.  Patient appears ill.  CT scan does not show evidence of pulmonary ballismus or pneumonia.  Patient does have a mild to moderate layering pleural effusion on the right with some atelectasis.  Postsurgical area appears normal.  Labs show lactic acid stable x2.  Lipase nonelevated.  LFTs and bilirubin is normal.  Troponin negative.  Kidney function is low at 1.26.  Other electrolytes reassuring.  Patient does have an increased white blood cell count of 30.6 however he does have a CLL.   Spoke with Dr. Marcello Moores with general surgery who does not suspect a postsurgical infection given the timeframe.  She agrees that if the patient looks ill, he should likely be admitted to medicine given the CLL.  They are going to recommend a HIDA scan to officially rule out a bile leak and they will see the patient in consultation and monitoring.  Patient also is now on 2 L nasal cannula for hypoxia.  I am still concerned about possible infection including occult PNA, however we will discuss with  medicine about antibiotics at this time and patient will be admitted for further management.  Medicine wants antibiots which they will order.   PT will be admitted.     Final Clinical Impressions(s) / ED Diagnoses   Final diagnoses:  Fever, unspecified fever cause  Epigastric pain  Chest pain, unspecified type  Shortness of breath    Clinical Impression: 1. Fever, unspecified fever cause   2. Epigastric pain   3. Chest pain, unspecified type   4. Shortness of breath     Disposition: Admit  This note was prepared with assistance of Dragon voice recognition software. Occasional wrong-word or sound-a-like substitutions may have occurred due to the inherent limitations of voice recognition software.     Keryn Nessler, Gwenyth Allegra, MD 04/21/19 2303

## 2019-04-21 NOTE — ED Triage Notes (Signed)
Pt reports he had his gallbladder out yesterday and today started getting a fever and it hurts to take a deep breathe.  Patient is alert and oriented and took tylenol to bring down his fever.

## 2019-04-21 NOTE — Progress Notes (Signed)
Pharmacy Antibiotic Note  Jonathon Parker is a 68 y.o. male admitted on 04/21/2019 with pneumonia.  Pharmacy has been consulted for cefepime and vancomycin dosing.  Plan: Cefepime 2 Gm IV q8h Vancomycin 2 Gm x1 then 1750 mg IV q24h for est AUC = 502 Goal AUC = 400-550 F/u scr/cultures/levels     Temp (24hrs), Avg:100.1 F (37.8 C), Min:99.4 F (37.4 C), Max:100.8 F (38.2 C)  Recent Labs  Lab 04/19/19 0830 04/20/19 1240 04/21/19 1942 04/21/19 2046  WBC 24.3*  --  30.6*  --   CREATININE  --  1.20 1.26*  --   LATICACIDVEN  --   --  1.5 1.4    Estimated Creatinine Clearance: 61.6 mL/min (A) (by C-G formula based on SCr of 1.26 mg/dL (H)).    No Known Allergies  Antimicrobials this admission: 9/16 cefepime >>  9/16 vancomycin >>   Dose adjustments this admission:   Microbiology results:  BCx:   UCx:    Sputum:    MRSA PCR:   Thank you for allowing pharmacy to be a part of this patient's care.  Dorrene German 04/21/2019 11:02 PM

## 2019-04-21 NOTE — H&P (Signed)
History and Physical    Jonathon Parker [id number] DOB: 07-27-1951 DOA: 04/21/2019  PCP: Nicoletta Dress, MD  Patient coming from: Home  I have personally briefly reviewed patient's old medical records in [place]  Chief Complaint: Post op fever  HPI: Jonathon Parker is a 68 y.o. male with medical history significant of CLL, HTN.  Patient just had lap-chole for cholecystitis performed yesterday.  Today patient presents to the ED with c/o upper abd pain and SOB, severe, associated fever and chills and nausea.  Not improved with tylenol and ibuprofen.  Fever 102.9 at home.  CP worse with deep breathing.  No diarrhea, no constipation, no cough, no melena.   ED Course: Tm 100.8, WBC 30k (24k yesterday), CTA chest, abd, pelvis: neg for PE, RLL atelectasis, small layering pleural effusion R lung (had small effusion in July MRI as well), expected 1 day post op findings with regards to the cholecystectomy.  Desating down to the 80s in the ED requiring 2L via [place].   Review of Systems: As per HPI, otherwise all review of systems negative.  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 Karle Starch in [facility] hospital     Depression    GERD (gastroesophageal reflux disease)    controlled with nexium    Hypertension     Past Surgical History:  Procedure Laterality Date   APPENDECTOMY     CARPAL TUNNEL RELEASE     left    CHOLECYSTECTOMY N/A 04/20/2019   Procedure: LAPAROSCOPIC CHOLECYSTECTOMY;  Surgeon: Greer Pickerel, MD;  Location: [facility];  Service: General;  Laterality: N/A;   COLON SURGERY  2002   10 inches of colon taken out    COLONOSCOPY     ENDOSCOPIC RETROGRADE CHOLANGIOPANCREATOGRAPHY (ERCP) WITH PROPOFOL N/A 03/01/2019   Procedure: ENDOSCOPIC RETROGRADE CHOLANGIOPANCREATOGRAPHY (ERCP) WITH PROPOFOL;  Surgeon: [name], MD;  Location: [facility];  Service:  Gastroenterology;  Laterality: N/A;   ERCP  03/01/2019   HERNIA REPAIR     bilateral inguinal    IR CHOLANGIOGRAM EXISTING TUBE  03/25/2019   IR PERC CHOLECYSTOSTOMY  03/02/2019   REMOVAL OF STONES  03/01/2019   Procedure: REMOVAL OF STONES;  Surgeon: [name], MD;  Location: [facility];  Service: Gastroenterology;;   [name]  03/01/2019   Procedure: [name];  Surgeon: Mansouraty, Telford Nab., MD;  Location: [facility];  Service: Gastroenterology;;     reports that he has never smoked. He has never used smokeless tobacco. He reports current alcohol use. He reports that he does not use drugs.  No Known Allergies  Family History  Problem Relation Age of Onset   Colon cancer Neg Hx    Esophageal cancer Neg Hx      Prior to Admission medications   Medication Sig Start Date End Date Taking? Authorizing Provider  acetaminophen (TYLENOL) 325 MG tablet Take 2 tablets (650 mg total) by mouth every 6 (six) hours as needed for mild pain (or temp > 100). 03/07/19  Yes Earnstine Regal, PA-C  ALPRAZolam Duanne Moron) 0.25 MG tablet Take 0.25 mg by mouth at bedtime as needed for anxiety.   Yes [provider]  buPROPion (WELLBUTRIN XL) 300 MG 24 hr tablet Take 300 mg by mouth at bedtime.   Yes [provider]  esomeprazole (NEXIUM) 40 MG capsule Take 40 mg by mouth daily.    Yes [provider]  lisinopril (  ZESTRIL) 20 MG tablet Take 20 mg by mouth daily.   Yes [provider]  oxyCODONE (OXY IR/ROXICODONE) 5 MG immediate release tablet Take 1 tablet (5 mg total) by mouth every 6 (six) hours as needed for severe pain. 04/20/19  Yes Greer Pickerel, MD  traZODone (DESYREL) 100 MG tablet Take 100 mg by mouth at bedtime.   Yes [provider]  triamcinolone cream (KENALOG) 0.1 % Apply 1 application topically daily as needed (rash).   Yes [provider]    Physical Exam: Vitals:   04/21/19 2015 04/21/19 2053 04/21/19  2057 04/21/19 2102  BP: (!) 167/96 (!) 149/88    Pulse: 74 74 79 96  Resp: (!) 22 (!) 21 19 (!) 29  Temp:  (!) 100.8 F (38.2 C)    TempSrc:  Rectal    SpO2:  92% 91% (!) 83%    Constitutional: NAD, calm, comfortable Eyes: PERRL, lids and conjunctivae normal ENMT: Mucous membranes are moist. Posterior pharynx clear of any exudate or lesions.Normal dentition.  Neck: normal, supple, no masses, no thyromegaly Respiratory: Mild tachypnea Cardiovascular: Regular rate and rhythm, no murmurs / rubs / gallops. No extremity edema. 2+ pedal pulses. No carotid bruits.  Abdomen: epigastric TTP, BS decreased, some mild redness around the umbilical site. Musculoskeletal: no clubbing / cyanosis. No joint deformity upper and lower extremities. Good ROM, no contractures. Normal muscle tone.  Skin: no rashes, lesions, ulcers. No induration Neurologic: CN 2-12 grossly intact. Sensation intact, DTR normal. Strength 5/5 in all 4.  Psychiatric: Normal judgment and insight. Alert and oriented x 3. Normal mood.    Labs on Admission: I have personally reviewed following labs and imaging studies  CBC: Recent Labs  Lab 04/19/19 0830 04/21/19 1942  WBC 24.3* 30.6*  NEUTROABS 2.8 9.5*  HGB 14.3 13.3  HCT 44.6 41.2  MCV 98.5 99.5  PLT 197 [id number]*   Basic Metabolic Panel: Recent Labs  Lab 04/20/19 1240 04/21/19 1942  NA 140 141  K 3.9 4.0  CL 105 102  CO2 27 29  GLUCOSE 91 115*  BUN 10 13  CREATININE 1.20 1.26*  CALCIUM 9.1 9.0   GFR: Estimated Creatinine Clearance: 61.6 mL/min (A) (by C-G formula based on SCr of 1.26 mg/dL (H)). Liver Function Tests: Recent Labs  Lab 04/20/19 1240 04/21/19 1942  AST 19 31  ALT 20 31  ALKPHOS 89 68  BILITOT 1.2 1.2  PROT 7.1 6.4*  ALBUMIN 4.2 3.8   Recent Labs  Lab 04/21/19 2046  LIPASE 25   No results for input(s): AMMONIA in the last 168 hours. Coagulation Profile: No results for input(s): INR, PROTIME in the last 168 hours. Cardiac  Enzymes: No results for input(s): CKTOTAL, CKMB, CKMBINDEX, TROPONINI in the last 168 hours. BNP (last 3 results) No results for input(s): PROBNP in the last 8760 hours. HbA1C: No results for input(s): HGBA1C in the last 72 hours. CBG: No results for input(s): GLUCAP in the last 168 hours. Lipid Profile: No results for input(s): CHOL, HDL, LDLCALC, TRIG, CHOLHDL, LDLDIRECT in the last 72 hours. Thyroid Function Tests: No results for input(s): TSH, T4TOTAL, FREET4, T3FREE, THYROIDAB in the last 72 hours. Anemia Panel: No results for input(s): VITAMINB12, FOLATE, FERRITIN, TIBC, IRON, RETICCTPCT in the last 72 hours. Urine analysis:    Component Value Date/Time   COLORURINE AMBER (A) 02/28/2019 0320   APPEARANCEUR HAZY (A) 02/28/2019 0320   LABSPEC 1.026 02/28/2019 0320   PHURINE 5.0 02/28/2019 0320   GLUCOSEU NEGATIVE  02/28/2019 0320   HGBUR SMALL (A) 02/28/2019 0320   BILIRUBINUR SMALL (A) 02/28/2019 0320   KETONESUR 5 (A) 02/28/2019 0320   PROTEINUR 100 (A) 02/28/2019 0320   NITRITE NEGATIVE 02/28/2019 0320   LEUKOCYTESUR NEGATIVE 02/28/2019 0320    Radiological Exams on Admission: Dg Chest 2 View  Result Date: 04/21/2019 CLINICAL DATA:  Postop fever, cholecystectomy 1 day prior EXAM: CHEST - 2 VIEW COMPARISON:  Radiograph 02/27/2011 FINDINGS: Chronic elevation of the right hemidiaphragm. Streaky opacities in the right lung base most suggestive of postoperative atelectasis. No consolidation, features of edema, pneumothorax, or effusion. Pulmonary vascularity is normally distributed. The cardiomediastinal contours are unremarkable. No acute osseous or soft tissue abnormality. No convincing evidence of free subdiaphragmatic air. IMPRESSION: Streaky opacities in the right lung base most suggestive of postoperative atelectasis. No other acute cardiopulmonary disease. Electronically Signed   By: Lovena Le M.D.   On: 04/21/2019 20:11   Ct Angio Chest Pe W And/or Wo Contrast  Result  Date: 04/21/2019 CLINICAL DATA:  68 year old male with lower chest pain, upper abdominal pain. Postoperative day 1 cholecystectomy. Chronic lymphocytic leukemia. EXAM: CT ANGIOGRAPHY CHEST WITH CONTRAST TECHNIQUE: Multidetector CT imaging of the chest was performed using the standard protocol during bolus administration of intravenous contrast. Multiplanar CT image reconstructions and MIPs were obtained to evaluate the vascular anatomy. CONTRAST:  175mL OMNIPAQUE IOHEXOL 350 MG/ML SOLN COMPARISON:  CT Abdomen and Pelvis today are reported separately. Chest radiographs earlier today. FINDINGS: Cardiovascular: Good contrast bolus timing in the pulmonary arterial tree. Respiratory motion. No convincing No focal filling defect identified in the pulmonary arteries to suggest acute pulmonary embolism. Negative visible aorta. Cardiac size within effusion normal limits. No pericardial. Mediastinum/Nodes: Negative. Lungs/Pleura: Elevated right hemidiaphragm. Small to moderate layering right pleural effusion. Adjacent enhancing right lung atelectasis. Atelectatic changes to the major airways which otherwise appear patent. Mild enhancing atelectasis in the dependent left lower lobe. No other pulmonary process. Upper Abdomen: Reported separately today. Musculoskeletal: No acute osseous abnormality identified. Review of the MIP images confirms the above findings. IMPRESSION: 1. Respiratory motion but good contrast bolus timing with no convincing pulmonary embolus. 2. Elevated right hemidiaphragm with small to moderate layering right pleural effusion and right greater than left lung atelectasis. 3. See also CT Abdomen and Pelvis reported separately. Electronically Signed   By: Genevie Ann M.D.   On: 04/21/2019 21:34   Ct Abdomen Pelvis W Contrast  Result Date: 04/21/2019 CLINICAL DATA:  68 year old male with lower chest pain, upper abdominal pain. Postoperative day 1 cholecystectomy. Chronic lymphocytic leukemia. EXAM: CT ABDOMEN  AND PELVIS WITH CONTRAST TECHNIQUE: Multidetector CT imaging of the abdomen and pelvis was performed using the standard protocol following bolus administration of intravenous contrast. CONTRAST:  15mL OMNIPAQUE IOHEXOL 350 MG/ML SOLN COMPARISON:  CTA chest today reported separately. CT Abdomen and Pelvis 01/16/2017. FINDINGS: Lower chest: Reported separately today. Elevation of the right hemidiaphragm is increased since 2018. Hepatobiliary: Ventral abdominal wall incision with no adverse features. Trace gas in the left rectus muscle. Small volume perihepatic fluid with simple fluid density. Regional mild inflammatory stranding. Surgical clips in the gallbladder fossa where a small volume of fluid and trace gas is present. Mild pneumobilia, mostly non dependent in the left lobe. More moderate volume gas in the CBD. The CBD measures 8-9 millimeters, with no stone or other filling defect identified along its course. Pancreas: Pancreas remains within normal limits. Spleen: Negative. Adrenals/Urinary Tract: Normal adrenal glands. Bilateral renal enhancement and contrast excretion is  symmetric and normal. Normal proximal ureters. No nephrolithiasis. Decompressed ureters. Unremarkable urinary bladder. Stomach/Bowel: Chronic rectosigmoid anastomosis with a nearby surgical clip is stable since 2018 with no adverse features. Mildly redundant but otherwise negative sigmoid. Negative descending colon. Redundant splenic flexure. The hepatic flexure remains within normal limits despite some regional inflammation. Negative right colon. Prior appendectomy, stable. Negative terminal ileum. No dilated small bowel. Mostly decompressed stomach and duodenum. There is a small volume of fluid in the gastrohepatic ligament and adjacent to the distal esophagus on series 4, image 15. There is evidence of a 2 centimeter duodenal diverticulum on coronal image 39. Trace pneumoperitoneum including between the left lobe of the liver and the  diaphragm. Vascular/Lymphatic: Mild Aortoiliac calcified atherosclerosis. Major arterial structures are patent. Portal venous system is patent. Reproductive: Lower inguinal/space of [place] prior hernia repair with mesh is stable since 2018. Otherwise negative. Other: Small volume free fluid in the right hemipelvis with slightly complex fluid density. There is also an associated small layering 4 millimeter calculus in the right hemipelvis on series 4, image 86. Musculoskeletal: Lower lumbar spine degeneration. No acute osseous abnormality identified. IMPRESSION: 1. Recent cholecystectomy with satisfactory appearance of the surgical bed. Small volume perihepatic fluid.  Trace pneumoperitoneum. Pneumobilia, with 8-9 mm diameter CBD, but no other CBD filling defect. Small volume of mildly complex fluid layering in the pelvis along with a 4 mm calculus which might be a dropped gallstone (series 4, image 86). Ventral abdominal incision with no adverse features. 2. No other acute or inflammatory process in the abdomen or pelvis. 3. Lower chest findings reported with chest CTA today separately. Electronically Signed   By: Genevie Ann M.D.   On: 04/21/2019 21:44    EKG: Independently reviewed.  Assessment/Plan Principal Problem:   Post-operative complication Active Problems:   Acute respiratory failure with hypoxia (HCC)   SIRS (systemic inflammatory response syndrome) (HCC)   Status post laparoscopic cholecystectomy   HCAP (healthcare-associated pneumonia)   CLL (chronic lymphocytic leukemia) ([place])    1. Post-op complications - 1. Hypoxia and SIRS with fever, will go ahead and empirically treat with cefepime / vanc for possible HCAP at this point. 2. PNA pathway 3. MRSA PCR nares, if negative then DC vanc. 4. IVF: NS at 125 5. Tylenol PRN fever 6. Morphine PRN pain 7. Gen surg consulted, recd HIDA scan in AM to r/o bile leak, will get this ordered. 2. CLL - chronic, stable 3. Acute resp failure with  hypoxia - 1. O2 via [place] and cont pulse ox  DVT prophylaxis: Lovenox Code Status: Full Family Communication: Wife at bedside Disposition Plan: Home after admit Consults called: Dr. Marcello Moores Admission status: Admit to inpatient  Severity of Illness: The appropriate patient status for this patient is INPATIENT. Inpatient status is judged to be reasonable and necessary in order to provide the required intensity of service to ensure the patient's safety. The patient's presenting symptoms, physical exam findings, and initial radiographic and laboratory data in the context of their chronic comorbidities is felt to place them at high risk for further clinical deterioration. Furthermore, it is not anticipated that the patient will be medically stable for discharge from the hospital within 2 midnights of admission. The following factors support the patient status of inpatient.   IP status for post op complications including SIRS, new O2 requirement, abd pain, need to rule out bile leak.   * I certify that at the point of admission it is my clinical judgment that the patient  will require inpatient hospital care spanning beyond 2 midnights from the point of admission due to high intensity of service, high risk for further deterioration and high frequency of surveillance required.*    Jonathon Parker M. DO [facility]  How to contact the [facility] Attending or Consulting provider [place] or covering provider during after hours [place], for this patient?  1. Check the care team in [facility] and look for a) attending/consulting [facility] provider listed and b) the [facility] team listed 2. Log into [email or web address]  Amion Physician Scheduling and messaging for groups and whole hospitals  On call and physician scheduling software for group practices, residents, hospitalists and other medical providers for call, clinic, rotation and shift schedules. On[employer] is a hospital-wide system for scheduling doctors and paging  doctors on call. EasyPlot is for scientific plotting and data analysis.  [email or web address]  and use [place]'s universal password to access. If you do not have the password, please contact the hospital operator.  3. Locate the [facility] provider you are looking for under [facility] and page to a number that you can be directly reached. 4. If you still have difficulty reaching the provider, please page the [facility] (Director on Call) for the Hospitalists listed on amion for assistance.  04/21/2019, 11:22 PM

## 2019-04-22 ENCOUNTER — Inpatient Hospital Stay (HOSPITAL_COMMUNITY): Payer: Medicare Other

## 2019-04-22 ENCOUNTER — Encounter (HOSPITAL_COMMUNITY): Payer: Self-pay

## 2019-04-22 DIAGNOSIS — F329 Major depressive disorder, single episode, unspecified: Secondary | ICD-10-CM

## 2019-04-22 DIAGNOSIS — I10 Essential (primary) hypertension: Secondary | ICD-10-CM | POA: Diagnosis present

## 2019-04-22 LAB — BASIC METABOLIC PANEL
Anion gap: 10 (ref 5–15)
BUN: 12 mg/dL (ref 8–23)
CO2: 28 mmol/L (ref 22–32)
Calcium: 8.9 mg/dL (ref 8.9–10.3)
Chloride: 102 mmol/L (ref 98–111)
Creatinine, Ser: 0.98 mg/dL (ref 0.61–1.24)
GFR calc Af Amer: >60 mL/min (ref >60)
GFR calc non Af Amer: >60 mL/min (ref >60)
Glucose, Bld: 109 mg/dL — ABNORMAL HIGH (ref 70–99)
Potassium: 4.1 mmol/L (ref 3.5–5.1)
Sodium: 140 mmol/L (ref 135–145)

## 2019-04-22 LAB — CBC
HCT: 37.4 % — ABNORMAL LOW (ref 39.0–52.0)
Hemoglobin: 11.9 g/dL — ABNORMAL LOW (ref 13.0–17.0)
MCH: 31.7 pg (ref 26.0–34.0)
MCHC: 31.8 g/dL (ref 30.0–36.0)
MCV: 99.7 fL (ref 80.0–100.0)
Platelets: 141 10*3/uL — ABNORMAL LOW (ref 150–400)
RBC: 3.75 MIL/uL — ABNORMAL LOW (ref 4.22–5.81)
RDW: 13.9 % (ref 11.5–15.5)
WBC: 23.8 10*3/uL — ABNORMAL HIGH (ref 4.0–10.5)
nRBC: 0 % (ref 0.0–0.2)

## 2019-04-22 LAB — COMPREHENSIVE METABOLIC PANEL
ALT: 26 U/L (ref 0–44)
AST: 23 U/L (ref 15–41)
Albumin: 3.1 g/dL — ABNORMAL LOW (ref 3.5–5.0)
Alkaline Phosphatase: 58 U/L (ref 38–126)
Anion gap: 6 (ref 5–15)
BUN: 14 mg/dL (ref 8–23)
CO2: 26 mmol/L (ref 22–32)
Calcium: 8.4 mg/dL — ABNORMAL LOW (ref 8.9–10.3)
Chloride: 109 mmol/L (ref 98–111)
Creatinine, Ser: 1.08 mg/dL (ref 0.61–1.24)
GFR calc Af Amer: >60 mL/min (ref >60)
GFR calc non Af Amer: >60 mL/min (ref >60)
Glucose, Bld: 109 mg/dL — ABNORMAL HIGH (ref 70–99)
Potassium: 4.1 mmol/L (ref 3.5–5.1)
Sodium: 141 mmol/L (ref 135–145)
Total Bilirubin: 1.8 mg/dL — ABNORMAL HIGH (ref 0.3–1.2)
Total Protein: 5.5 g/dL — ABNORMAL LOW (ref 6.5–8.1)

## 2019-04-22 LAB — TROPONIN I (HIGH SENSITIVITY): Troponin I (High Sensitivity): 8 ng/L (ref <18)

## 2019-04-22 LAB — HIV ANTIBODY (ROUTINE TESTING W REFLEX): HIV Screen 4th Generation wRfx: NONREACTIVE

## 2019-04-22 LAB — MRSA PCR SCREENING: MRSA by PCR: NEGATIVE

## 2019-04-22 LAB — SURGICAL PATHOLOGY

## 2019-04-22 MED ORDER — OXYCODONE HCL 5 MG PO TABS
5.0000 mg | ORAL_TABLET | ORAL | Status: DC | PRN
  Administered 2019-04-22 – 2019-04-23 (×4): 10 mg via ORAL
  Filled 2019-04-22 (×4): qty 2

## 2019-04-22 MED ORDER — SODIUM CHLORIDE 0.9 % IV SOLN
2.0000 g | Freq: Three times a day (TID) | INTRAVENOUS | Status: DC
  Administered 2019-04-22 – 2019-04-23 (×4): 2 g via INTRAVENOUS
  Filled 2019-04-22 (×5): qty 2

## 2019-04-22 MED ORDER — ACETAMINOPHEN 500 MG PO TABS
1000.0000 mg | ORAL_TABLET | Freq: Three times a day (TID) | ORAL | Status: DC
  Administered 2019-04-22 – 2019-04-23 (×4): 1000 mg via ORAL
  Filled 2019-04-22 (×4): qty 2

## 2019-04-22 MED ORDER — DOCUSATE SODIUM 100 MG PO CAPS
100.0000 mg | ORAL_CAPSULE | Freq: Two times a day (BID) | ORAL | Status: DC
  Administered 2019-04-22 – 2019-04-23 (×3): 100 mg via ORAL
  Filled 2019-04-22 (×3): qty 1

## 2019-04-22 MED ORDER — VANCOMYCIN HCL 10 G IV SOLR: 1750.0000 mg | Freq: Every day | INTRAVENOUS | Status: DC

## 2019-04-22 MED ORDER — MORPHINE SULFATE (PF) 2 MG/ML IV SOLN
1.0000 mg | INTRAVENOUS | Status: DC | PRN
  Administered 2019-04-22 (×3): 2 mg via INTRAVENOUS
  Filled 2019-04-22 (×3): qty 1

## 2019-04-22 MED ORDER — TECHNETIUM TC 99M MEBROFENIN IV KIT
5.5000 | PACK | Freq: Once | INTRAVENOUS | Status: AC | PRN
  Administered 2019-04-22: 5.5 via INTRAVENOUS

## 2019-04-22 MED ORDER — KETOROLAC TROMETHAMINE 15 MG/ML IJ SOLN
15.0000 mg | Freq: Three times a day (TID) | INTRAMUSCULAR | Status: DC | PRN
  Administered 2019-04-22 – 2019-04-23 (×2): 15 mg via INTRAVENOUS
  Filled 2019-04-22 (×2): qty 1

## 2019-04-22 NOTE — Progress Notes (Signed)
Subjective/Chief Complaint: Events reviewed Sudden onset worsening upper abd & chest pain; fevers. Pain with breathing No n/v Only able to do about 600 on IS   Objective: Vital signs in last 24 hours: Temp:  [98.3 F (36.8 C)-100.8 F (38.2 C)] 98.3 F (36.8 C) (09/17 [id number]) Pulse Rate:  [65-96] 65 (09/17 0613) Resp:  [16-29] 16 (09/17 0613) BP: (134-168)/(77-99) 134/77 (09/17 0613) SpO2:  [83 %-98 %] 97 % (09/17 [id number]) Weight:  [87.7 kg] 87.7 kg (09/17 0116) Last BM Date: 04/20/19  Intake/Output from previous day: 09/16 0701 - 09/17 0700 In: 1222.7 [P.O.:210; I.V.:413.7; IV Piggyback:599] Out: 520 [Urine:520] Intake/Output this shift: No intake/output data recorded.  Resting comfortably, nontoxic Fairly cta but not taking deep breathes, appears uncomfortable with deep inspiration Reg Soft, approp mild TTP, bruising; no rebound/guarding No edema ox3  Lab Results:  Recent Labs    04/21/19 1942 04/22/19 0251  WBC 30.6* 23.8*  HGB 13.3 11.9*  HCT 41.2 37.4*  PLT 149* 141*   BMET Recent Labs    04/21/19 1942 04/22/19 0251  NA 141 141  K 4.0 4.1  CL 102 109  CO2 29 26  GLUCOSE 115* 109*  BUN 13 14  CREATININE 1.26* 1.08  CALCIUM 9.0 8.4*   PT/INR No results for input(s): LABPROT, INR in the last 72 hours. ABG No results for input(s): PHART, HCO3 in the last 72 hours.  Invalid input(s): PCO2, PO2  Studies/Results: Dg Chest 2 View  Result Date: 04/21/2019 CLINICAL DATA:  Postop fever, cholecystectomy 1 day prior EXAM: CHEST - 2 VIEW COMPARISON:  Radiograph 02/27/2011 FINDINGS: Chronic elevation of the right hemidiaphragm. Streaky opacities in the right lung base most suggestive of postoperative atelectasis. No consolidation, features of edema, pneumothorax, or effusion. Pulmonary vascularity is normally distributed. The cardiomediastinal contours are unremarkable. No acute osseous or soft tissue abnormality. No convincing evidence of free  subdiaphragmatic air. IMPRESSION: Streaky opacities in the right lung base most suggestive of postoperative atelectasis. No other acute cardiopulmonary disease. Electronically Signed   By: Lovena Le M.D.   On: 04/21/2019 20:11   Ct Angio Chest Pe W And/or Wo Contrast  Result Date: 04/21/2019 CLINICAL DATA:  68 year old male with lower chest pain, upper abdominal pain. Postoperative day 1 cholecystectomy. Chronic lymphocytic leukemia. EXAM: CT ANGIOGRAPHY CHEST WITH CONTRAST TECHNIQUE: Multidetector CT imaging of the chest was performed using the standard protocol during bolus administration of intravenous contrast. Multiplanar CT image reconstructions and MIPs were obtained to evaluate the vascular anatomy. CONTRAST:  15mL OMNIPAQUE IOHEXOL 350 MG/ML SOLN COMPARISON:  CT Abdomen and Pelvis today are reported separately. Chest radiographs earlier today. FINDINGS: Cardiovascular: Good contrast bolus timing in the pulmonary arterial tree. Respiratory motion. No convincing No focal filling defect identified in the pulmonary arteries to suggest acute pulmonary embolism. Negative visible aorta. Cardiac size within effusion normal limits. No pericardial. Mediastinum/Nodes: Negative. Lungs/Pleura: Elevated right hemidiaphragm. Small to moderate layering right pleural effusion. Adjacent enhancing right lung atelectasis. Atelectatic changes to the major airways which otherwise appear patent. Mild enhancing atelectasis in the dependent left lower lobe. No other pulmonary process. Upper Abdomen: Reported separately today. Musculoskeletal: No acute osseous abnormality identified. Review of the MIP images confirms the above findings. IMPRESSION: 1. Respiratory motion but good contrast bolus timing with no convincing pulmonary embolus. 2. Elevated right hemidiaphragm with small to moderate layering right pleural effusion and right greater than left lung atelectasis. 3. See also CT Abdomen and Pelvis reported separately.  Electronically Signed  By: Genevie Ann M.D.   On: 04/21/2019 21:34   Ct Abdomen Pelvis W Contrast  Result Date: 04/21/2019 CLINICAL DATA:  68 year old male with lower chest pain, upper abdominal pain. Postoperative day 1 cholecystectomy. Chronic lymphocytic leukemia. EXAM: CT ABDOMEN AND PELVIS WITH CONTRAST TECHNIQUE: Multidetector CT imaging of the abdomen and pelvis was performed using the standard protocol following bolus administration of intravenous contrast. CONTRAST:  166mL OMNIPAQUE IOHEXOL 350 MG/ML SOLN COMPARISON:  CTA chest today reported separately. CT Abdomen and Pelvis 01/16/2017. FINDINGS: Lower chest: Reported separately today. Elevation of the right hemidiaphragm is increased since 2018. Hepatobiliary: Ventral abdominal wall incision with no adverse features. Trace gas in the left rectus muscle. Small volume perihepatic fluid with simple fluid density. Regional mild inflammatory stranding. Surgical clips in the gallbladder fossa where a small volume of fluid and trace gas is present. Mild pneumobilia, mostly non dependent in the left lobe. More moderate volume gas in the CBD. The CBD measures 8-9 millimeters, with no stone or other filling defect identified along its course. Pancreas: Pancreas remains within normal limits. Spleen: Negative. Adrenals/Urinary Tract: Normal adrenal glands. Bilateral renal enhancement and contrast excretion is symmetric and normal. Normal proximal ureters. No nephrolithiasis. Decompressed ureters. Unremarkable urinary bladder. Stomach/Bowel: Chronic rectosigmoid anastomosis with a nearby surgical clip is stable since 2018 with no adverse features. Mildly redundant but otherwise negative sigmoid. Negative descending colon. Redundant splenic flexure. The hepatic flexure remains within normal limits despite some regional inflammation. Negative right colon. Prior appendectomy, stable. Negative terminal ileum. No dilated small bowel. Mostly decompressed stomach and  duodenum. There is a small volume of fluid in the gastrohepatic ligament and adjacent to the distal esophagus on series 4, image 15. There is evidence of a 2 centimeter duodenal diverticulum on coronal image 39. Trace pneumoperitoneum including between the left lobe of the liver and the diaphragm. Vascular/Lymphatic: Mild Aortoiliac calcified atherosclerosis. Major arterial structures are patent. Portal venous system is patent. Reproductive: Lower inguinal/space of Retzius prior hernia repair with mesh is stable since 2018. Otherwise negative. Other: Small volume free fluid in the right hemipelvis with slightly complex fluid density. There is also an associated small layering 4 millimeter calculus in the right hemipelvis on series 4, image 86. Musculoskeletal: Lower lumbar spine degeneration. No acute osseous abnormality identified. IMPRESSION: 1. Recent cholecystectomy with satisfactory appearance of the surgical bed. Small volume perihepatic fluid.  Trace pneumoperitoneum. Pneumobilia, with 8-9 mm diameter CBD, but no other CBD filling defect. Small volume of mildly complex fluid layering in the pelvis along with a 4 mm calculus which might be a dropped gallstone (series 4, image 86). Ventral abdominal incision with no adverse features. 2. No other acute or inflammatory process in the abdomen or pelvis. 3. Lower chest findings reported with chest CTA today separately. Electronically Signed   By: Genevie Ann M.D.   On: 04/21/2019 21:44    Anti-infectives: Anti-infectives (From admission, onward)   Start     Dose/Rate Route Frequency Ordered Stop   04/23/19 0400  vancomycin (VANCOCIN) 1,750 mg in sodium chloride 0.9 % 500 mL IVPB     1,750 mg 250 mL/hr over 120 Minutes Intravenous Daily 04/22/19 0018     04/22/19 0800  ceFEPIme (MAXIPIME) 2 g in sodium chloride 0.9 % 100 mL IVPB     2 g 200 mL/hr over 30 Minutes Intravenous Every 8 hours 04/22/19 0018     04/22/19 0030  vancomycin (VANCOCIN) 2,000 mg in  sodium chloride 0.9 % 500 mL  IVPB     2,000 mg 250 mL/hr over 120 Minutes Intravenous  Once 04/21/19 2300 04/22/19 0252   04/21/19 2300  ceFEPIme (MAXIPIME) 2 g in sodium chloride 0.9 % 100 mL IVPB     2 g 200 mL/hr over 30 Minutes Intravenous NOW 04/21/19 2259 04/22/19 0014      Assessment/Plan: S/p interval lap cholecystectomy POD 2 for chronic calculous CLL Fever, chest/abd pain, SOB  Imaging reviewed. No signs of bowel perforation. Expected amount of fluid in RUQ - irrigated with 2L. Snow in GB fossa. Pt had dense woody inflammation of GB and dilated cystic duct. Had critical view of safety.  Had to endoloop cystic duct stump given his dilation.  Has some RLL atelectasis and effusion - believe this is what is contributing to his pain with respiration.  No fever today, HR normal, wbc back to his baseline.   Agree with HIDA to r/o bile leak Agree with empiric abx to cover lungs Needs aggressive pulm toilet - is, flutter valve, oob, ambulate Chemical vte prophylaxis.  Pain control - scheduled tylenol, prn toradol, oxy, morphine  Discussed imaging with pt and plan Greatly appreciate [facility] assist  Leighton Ruff. Redmond Pulling, MD, FACS General, Bariatric, & Minimally Invasive Surgery [facility], [place]   LOS: 1 day    Greer Pickerel 04/22/2019

## 2019-04-22 NOTE — Plan of Care (Signed)

## 2019-04-22 NOTE — Progress Notes (Signed)
[facility]  PROGRESS NOTE  Jonathon Parker [id number] DOB: 21-Nov-1950 DOA: 04/21/2019 PCP: Nicoletta Dress, MD  Brief History    Jonathon Parker is a 68 y.o. year old male with medical history significant for acute calculus cholecystitis status post ERCP with sphincterotomy on 03/01/2019, and percutaneous cholecystomy tube placement on 03/02/2019 who presented on 04/21/2019 with acute onset abdominal pain, shortness of breath and fever 1 day after interval lap cholecystectomy on  04/20/19 for chronic calculus cholecystitis.  A & P     Sirs criteria.  Fever 100.8 on admission, tachypneic with oxygen desaturation to 83% requiring 2 L, worsening leukocytosis.  Unclear if this is related to recent procedure and CT imaging shows no acute abnormalities will assess with HIDA scan.  On empiric vancomycin and cefepime given concern for potential respiratory source; however, only shows slight effusion and atelectasis.  More likely fever related to postoperative atelectasis, patient additionally had baseline leukocytosis that acutely worsened likely in the setting of recent surgery and return back to his previous baseline. will monitor blood cultures to determine need for continued antibiotics.  Aggressive pulmonary toilet with incentive spirometry, out of bed to chair   Acute hypoxic respiratory failure.  Oxygen desaturated percent requiring 2 L.  Slight effusion on CT imaging but suspect this most related to atelectasis from recent procedure/with patient complaining of shortness of breath.  Continue empiric antibiotics in case this is pneumonia, will closely monitor blood cultures, patient is not coughing.   Status post laparoscopic cholecystectomy (04/20/2019).  For acute calculus cholecystitis previously managed by percutaneous cholecystostomy tube.  Surgery recommends obtaining HIDA scan to assess for bile leak.  Otherwise laparoscopic sites look clean dry and intact with no signs of infection.  CT  abdomen on admission showed no worrisome acute abnormalities.  Pain control with scheduled Tylenol, PRN IV morphine, p.o. oxycodone IR, control   CLL, chronic, stable.  Has chronically elevated white count related to CLL.  Continue to monitor   Depression, stable.  Continue Wellbutrin, trazodone, Xanax as needed   Hypertension, stable.  Continue lisinopril   GERD, stable continue PPI    DVT prophylaxis: Lovenox Code Status: Full code Family Communication: No family at bedside Disposition Plan: Continue IV antibiotics while ruling out active infection, needs HIDA scan to assess for any bile leakage, close respiratory/abdominal exams.    [facility] Direct contact: see [email or web address] (further directions at bottom of note if needed) 7PM-7AM contact night coverage as at bottom of note 04/22/2019, 9:14 AM  LOS: 1 day   Consultants   Surgery  Procedures   None  Antibiotics   9/16 vancomycin, cefepime  Interval History/Subjective  Still has belly pain Feels it is difficult to breathe  Objective   Vitals:  Vitals:   04/22/19 0048 04/22/19 0613  BP: 140/79 134/77  Pulse: 68 65  Resp: (!) 22 16  Temp: 99 F (37.2 C) 98.3 F (36.8 C)  SpO2: 98% 97%    Exam:  Awake Alert, Oriented X 3, No new F.N deficits, uncomfortable but in no acute distress [place].AT Limited respiratory effort related to pain, able to hear some clear breath sounds, on 2 L O2  RRR,No Gallops,Rubs or new Murmurs,  Diminished bowel sounds, appropriately tender at laparoscopic sites which are clean dry and intact, dressing in place on right upper quadrant at site of previous tube, no rebound tenderness, no guarding, no rigidity, No Cyanosis, Clubbing or edema, No new Rash or bruise     I have personally  reviewed the following:   Data Reviewed: Basic Metabolic Panel: Recent Labs  Lab 04/20/19 1240 04/21/19 1942 04/22/19 0251  NA 140 141 141  K 3.9 4.0 4.1  CL 105 102 109  CO2 27 29 26     GLUCOSE 91 115* 109*  BUN 10 13 14   CREATININE 1.20 1.26* 1.08  CALCIUM 9.1 9.0 8.4*   Liver Function Tests: Recent Labs  Lab 04/20/19 1240 04/21/19 1942 04/22/19 0251  AST 19 31 23   ALT 20 31 26   ALKPHOS 89 68 58  BILITOT 1.2 1.2 1.8*  PROT 7.1 6.4* 5.5*  ALBUMIN 4.2 3.8 3.1*   Recent Labs  Lab 04/21/19 2046  LIPASE 25   No results for input(s): AMMONIA in the last 168 hours. CBC: Recent Labs  Lab 04/19/19 0830 04/21/19 1942 04/22/19 0251  WBC 24.3* 30.6* 23.8*  NEUTROABS 2.8 9.5*  --   HGB 14.3 13.3 11.9*  HCT 44.6 41.2 37.4*  MCV 98.5 99.5 99.7  PLT 197 149* 141*   Cardiac Enzymes: No results for input(s): CKTOTAL, CKMB, CKMBINDEX, TROPONINI in the last 168 hours. BNP (last 3 results) No results for input(s): BNP in the last 8760 hours.  ProBNP (last 3 results) No results for input(s): PROBNP in the last 8760 hours.  CBG: No results for input(s): GLUCAP in the last 168 hours.  Recent Results (from the past 240 hour(s))  Novel Coronavirus, NAA (Hosp order, Send-out to Ref Lab; TAT 18-24 hrs     Status: None   Collection Time: 04/16/19  2:25 PM   Specimen: Nasopharyngeal Swab; Respiratory  Result Value Ref Range Status   SARS-CoV-2, NAA NOT DETECTED NOT DETECTED Final    Comment: (NOTE) This nucleic acid amplification test was developed and its performance characteristics determined by [employer]. Nucleic acid amplification tests include PCR and TMA. This test has not been FDA cleared or approved. This test has been authorized by FDA under an Emergency Use Authorization (EUA). This test is only authorized for the duration of time the declaration that circumstances exist justifying the authorization of the emergency use of in vitro diagnostic tests for detection of SARS-CoV-2 virus and/or diagnosis of COVID-19 infection under section 564(b)(1) of the Act, 21 U.S.C. [id number]) (1), unless the authorization is terminated or revoked  sooner. When diagnostic testing is negative, the possibility of a false negative result should be considered in the context of a patient's recent exposures and the presence of clinical signs and symptoms consistent with COVID-19. An individual without symptoms of COVID- 19 and who is not shedding SARS-CoV-2 vi rus would expect to have a negative (not detected) result in this assay. Performed At: [facility] [address] [place], [place] [id number] Rush Farmer MD [id number]    [place]  Final    Comment: Performed at [facility], [place] [address], [place], [place] [address]  SARS Coronavirus 2 [facility] order, Performed in [facility] hospital lab) Nasopharyngeal Nasopharyngeal Swab     Status: None   Collection Time: 04/21/19  8:58 PM   Specimen: Nasopharyngeal Swab  Result Value Ref Range Status   SARS Coronavirus 2 NEGATIVE NEGATIVE Final    Comment: (NOTE) If result is NEGATIVE SARS-CoV-2 target nucleic acids are NOT DETECTED. The SARS-CoV-2 RNA is generally detectable in upper and lower  respiratory specimens during the acute phase of infection. The lowest  concentration of SARS-CoV-2 viral copies this assay can detect is 250  copies / mL. A negative result does not  preclude SARS-CoV-2 infection  and should not be used as the sole basis for treatment or other  patient management decisions.  A negative result may occur with  improper specimen collection / handling, submission of specimen other  than nasopharyngeal swab, presence of viral mutation(s) within the  areas targeted by this assay, and inadequate number of viral copies  (<250 copies / mL). A negative result must be combined with clinical  observations, patient history, and epidemiological information. If result is POSITIVE SARS-CoV-2 target nucleic acids are DETECTED. The SARS-CoV-2 RNA is generally detectable in upper and lower  respiratory specimens dur ing the acute  phase of infection.  Positive  results are indicative of active infection with SARS-CoV-2.  Clinical  correlation with patient history and other diagnostic information is  necessary to determine patient infection status.  Positive results do  not rule out bacterial infection or co-infection with other viruses. If result is PRESUMPTIVE POSTIVE SARS-CoV-2 nucleic acids MAY BE PRESENT.   A presumptive positive result was obtained on the submitted specimen  and confirmed on repeat testing.  While 2019 novel coronavirus  (SARS-CoV-2) nucleic acids may be present in the submitted sample  additional confirmatory testing may be necessary for epidemiological  and / or clinical management purposes  to differentiate between  SARS-CoV-2 and other Sarbecovirus currently known to infect humans.  If clinically indicated additional testing with an alternate test  methodology [phone]) is advised. The SARS-CoV-2 RNA is generally  detectable in upper and lower respiratory sp ecimens during the acute  phase of infection. The expected result is Negative. Fact Sheet for Patients:  StrictlyIdeas.no Fact Sheet for Healthcare Providers: BankingDealers.co.za This test is not yet approved or cleared by the [place] FDA and has been authorized for detection and/or diagnosis of SARS-CoV-2 by FDA under an Emergency Use Authorization (EUA).  This EUA will remain in effect (meaning this test can be used) for the duration of the COVID-19 declaration under Section 564(b)(1) of the Act, 21 U.S.C. section 360bbb-3(b)(1), unless the authorization is terminated or revoked sooner. Performed at [facility], [place] [address], [place], [place] [address]   Urine culture     Status: None (Preliminary result)   Collection Time: 04/21/19 11:36 PM   Specimen: Urine, Random  Result Value Ref Range Status   Specimen Description   Final    URINE,  RANDOM Performed at [facility], [place] [address], [place], [place] [address]    Special Requests   Final    NONE Performed at [facility], [place] [address], [place], [place] [address]    Culture PENDING  Incomplete   Report Status PENDING  Incomplete  MRSA PCR Screening     Status: None   Collection Time: 04/22/19  1:10 AM   Specimen: Nasal Mucosa; Nasopharyngeal  Result Value Ref Range Status   MRSA by PCR NEGATIVE NEGATIVE Final    Comment:        The GeneXpert MRSA Assay (FDA approved for NASAL specimens only), is one component of a comprehensive MRSA colonization surveillance program. It is not intended to diagnose MRSA infection nor to guide or monitor treatment for MRSA infections. Performed at [facility], [place] [address] [place], [place] [address]      Studies: Dg Chest 2 View  Result Date: 04/21/2019 CLINICAL DATA:  Postop fever, cholecystectomy 1 day prior EXAM: CHEST - 2 VIEW COMPARISON:  Radiograph 02/27/2011 FINDINGS: Chronic elevation of the right hemidiaphragm. Streaky  opacities in the right lung base most suggestive of postoperative atelectasis. No consolidation, features of edema, pneumothorax, or effusion. Pulmonary vascularity is normally distributed. The cardiomediastinal contours are unremarkable. No acute osseous or soft tissue abnormality. No convincing evidence of free subdiaphragmatic air. IMPRESSION: Streaky opacities in the right lung base most suggestive of postoperative atelectasis. No other acute cardiopulmonary disease. Electronically Signed   By: Lovena Le M.D.   On: 04/21/2019 20:11   Ct Angio Chest Pe W And/or Wo Contrast  Result Date: 04/21/2019 CLINICAL DATA:  68 year old male with lower chest pain, upper abdominal pain. Postoperative day 1 cholecystectomy. Chronic lymphocytic leukemia. EXAM: CT ANGIOGRAPHY CHEST WITH CONTRAST TECHNIQUE: Multidetector CT imaging of the chest was performed using the  standard protocol during bolus administration of intravenous contrast. Multiplanar CT image reconstructions and MIPs were obtained to evaluate the vascular anatomy. CONTRAST:  139mL OMNIPAQUE IOHEXOL 350 MG/ML SOLN COMPARISON:  CT Abdomen and Pelvis today are reported separately. Chest radiographs earlier today. FINDINGS: Cardiovascular: Good contrast bolus timing in the pulmonary arterial tree. Respiratory motion. No convincing No focal filling defect identified in the pulmonary arteries to suggest acute pulmonary embolism. Negative visible aorta. Cardiac size within effusion normal limits. No pericardial. Mediastinum/Nodes: Negative. Lungs/Pleura: Elevated right hemidiaphragm. Small to moderate layering right pleural effusion. Adjacent enhancing right lung atelectasis. Atelectatic changes to the major airways which otherwise appear patent. Mild enhancing atelectasis in the dependent left lower lobe. No other pulmonary process. Upper Abdomen: Reported separately today. Musculoskeletal: No acute osseous abnormality identified. Review of the MIP images confirms the above findings. IMPRESSION: 1. Respiratory motion but good contrast bolus timing with no convincing pulmonary embolus. 2. Elevated right hemidiaphragm with small to moderate layering right pleural effusion and right greater than left lung atelectasis. 3. See also CT Abdomen and Pelvis reported separately. Electronically Signed   By: Genevie Ann M.D.   On: 04/21/2019 21:34   Ct Abdomen Pelvis W Contrast  Result Date: 04/21/2019 CLINICAL DATA:  68 year old male with lower chest pain, upper abdominal pain. Postoperative day 1 cholecystectomy. Chronic lymphocytic leukemia. EXAM: CT ABDOMEN AND PELVIS WITH CONTRAST TECHNIQUE: Multidetector CT imaging of the abdomen and pelvis was performed using the standard protocol following bolus administration of intravenous contrast. CONTRAST:  171mL OMNIPAQUE IOHEXOL 350 MG/ML SOLN COMPARISON:  CTA chest today reported  separately. CT Abdomen and Pelvis 01/16/2017. FINDINGS: Lower chest: Reported separately today. Elevation of the right hemidiaphragm is increased since 2018. Hepatobiliary: Ventral abdominal wall incision with no adverse features. Trace gas in the left rectus muscle. Small volume perihepatic fluid with simple fluid density. Regional mild inflammatory stranding. Surgical clips in the gallbladder fossa where a small volume of fluid and trace gas is present. Mild pneumobilia, mostly non dependent in the left lobe. More moderate volume gas in the CBD. The CBD measures 8-9 millimeters, with no stone or other filling defect identified along its course. Pancreas: Pancreas remains within normal limits. Spleen: Negative. Adrenals/Urinary Tract: Normal adrenal glands. Bilateral renal enhancement and contrast excretion is symmetric and normal. Normal proximal ureters. No nephrolithiasis. Decompressed ureters. Unremarkable urinary bladder. Stomach/Bowel: Chronic rectosigmoid anastomosis with a nearby surgical clip is stable since 2018 with no adverse features. Mildly redundant but otherwise negative sigmoid. Negative descending colon. Redundant splenic flexure. The hepatic flexure remains within normal limits despite some regional inflammation. Negative right colon. Prior appendectomy, stable. Negative terminal ileum. No dilated small bowel. Mostly decompressed stomach and duodenum. There is a small volume of fluid in the gastrohepatic ligament and  adjacent to the distal esophagus on series 4, image 15. There is evidence of a 2 centimeter duodenal diverticulum on coronal image 39. Trace pneumoperitoneum including between the left lobe of the liver and the diaphragm. Vascular/Lymphatic: Mild Aortoiliac calcified atherosclerosis. Major arterial structures are patent. Portal venous system is patent. Reproductive: Lower inguinal/space of [place] prior hernia repair with mesh is stable since 2018. Otherwise negative. Other: Small  volume free fluid in the right hemipelvis with slightly complex fluid density. There is also an associated small layering 4 millimeter calculus in the right hemipelvis on series 4, image 86. Musculoskeletal: Lower lumbar spine degeneration. No acute osseous abnormality identified. IMPRESSION: 1. Recent cholecystectomy with satisfactory appearance of the surgical bed. Small volume perihepatic fluid.  Trace pneumoperitoneum. Pneumobilia, with 8-9 mm diameter CBD, but no other CBD filling defect. Small volume of mildly complex fluid layering in the pelvis along with a 4 mm calculus which might be a dropped gallstone (series 4, image 86). Ventral abdominal incision with no adverse features. 2. No other acute or inflammatory process in the abdomen or pelvis. 3. Lower chest findings reported with chest CTA today separately. Electronically Signed   By: Genevie Ann M.D.   On: 04/21/2019 21:44    Scheduled Meds:  acetaminophen  1,000 mg Oral Q8H   buPROPion  300 mg Oral QHS   docusate sodium  100 mg Oral BID   enoxaparin (LOVENOX) injection  40 mg Subcutaneous Daily   lisinopril  20 mg Oral Daily   pantoprazole  80 mg Oral Q1200   sodium chloride flush  3 mL Intravenous Once   traZODone  100 mg Oral QHS   Continuous Infusions:  sodium chloride 125 mL/hr at 04/22/19 0600   ceFEPime (MAXIPIME) IV 2 g (04/22/19 0736)   [START ON 04/23/2019] vancomycin      Principal Problem:   Post-operative complication Active Problems:   Acute respiratory failure with hypoxia (HCC)   SIRS (systemic inflammatory response syndrome) (HCC)   Status post laparoscopic cholecystectomy   HCAP (healthcare-associated pneumonia)   CLL (chronic lymphocytic leukemia) ([place])      Desiree Hane  [facility]

## 2019-04-22 NOTE — Progress Notes (Addendum)
Subjective: Pt presented to ED after sudden onset of upper abd and chest pain.  This was associated with fever to 102.9 and SOB.    Objective: Vital signs in last 24 hours: Temp:  [98.3 F (36.8 C)-100.8 F (38.2 C)] 98.3 F (36.8 C) (09/17 [id number]) Pulse Rate:  [65-96] 65 (09/17 0613) Resp:  [16-29] 16 (09/17 0613) BP: (134-168)/(77-99) 134/77 (09/17 0613) SpO2:  [83 %-98 %] 97 % (09/17 [id number]) Weight:  [87.7 kg] 87.7 kg (09/17 0116)   Intake/Output from previous day: 09/16 0701 - 09/17 0700 In: 1222.7 [P.O.:210; I.V.:413.7; IV Piggyback:599] Out: 520 [Urine:520] Intake/Output this shift: No intake/output data recorded.   General appearance: alert and cooperative Resp: clear to auscultation bilaterally GI: normal findings: soft, appropriately tender  Incision: no significant drainage  Lab Results:  Recent Labs    04/21/19 1942 04/22/19 0251  WBC 30.6* 23.8*  HGB 13.3 11.9*  HCT 41.2 37.4*  PLT 149* 141*   BMET Recent Labs    04/21/19 1942 04/22/19 0251  NA 141 141  K 4.0 4.1  CL 102 109  CO2 29 26  GLUCOSE 115* 109*  BUN 13 14  CREATININE 1.26* 1.08  CALCIUM 9.0 8.4*   PT/INR No results for input(s): LABPROT, INR in the last 72 hours. ABG No results for input(s): PHART, HCO3 in the last 72 hours.  Invalid input(s): PCO2, PO2  MEDS, Scheduled . buPROPion  300 mg Oral QHS  . enoxaparin (LOVENOX) injection  40 mg Subcutaneous Daily  . lisinopril  20 mg Oral Daily  . pantoprazole  80 mg Oral Q1200  . sodium chloride (PF)      . sodium chloride flush  3 mL Intravenous Once  . traZODone  100 mg Oral QHS    Studies/Results: Dg Chest 2 View  Result Date: 04/21/2019 CLINICAL DATA:  Postop fever, cholecystectomy 1 day prior EXAM: CHEST - 2 VIEW COMPARISON:  Radiograph 02/27/2011 FINDINGS: Chronic elevation of the right hemidiaphragm. Streaky opacities in the right lung base most suggestive of postoperative atelectasis. No consolidation, features of  edema, pneumothorax, or effusion. Pulmonary vascularity is normally distributed. The cardiomediastinal contours are unremarkable. No acute osseous or soft tissue abnormality. No convincing evidence of free subdiaphragmatic air. IMPRESSION: Streaky opacities in the right lung base most suggestive of postoperative atelectasis. No other acute cardiopulmonary disease. Electronically Signed   By: Lovena Le M.D.   On: 04/21/2019 20:11   Ct Angio Chest Pe W And/or Wo Contrast  Result Date: 04/21/2019 CLINICAL DATA:  68 year old male with lower chest pain, upper abdominal pain. Postoperative day 1 cholecystectomy. Chronic lymphocytic leukemia. EXAM: CT ANGIOGRAPHY CHEST WITH CONTRAST TECHNIQUE: Multidetector CT imaging of the chest was performed using the standard protocol during bolus administration of intravenous contrast. Multiplanar CT image reconstructions and MIPs were obtained to evaluate the vascular anatomy. CONTRAST:  148mL OMNIPAQUE IOHEXOL 350 MG/ML SOLN COMPARISON:  CT Abdomen and Pelvis today are reported separately. Chest radiographs earlier today. FINDINGS: Cardiovascular: Good contrast bolus timing in the pulmonary arterial tree. Respiratory motion. No convincing No focal filling defect identified in the pulmonary arteries to suggest acute pulmonary embolism. Negative visible aorta. Cardiac size within effusion normal limits. No pericardial. Mediastinum/Nodes: Negative. Lungs/Pleura: Elevated right hemidiaphragm. Small to moderate layering right pleural effusion. Adjacent enhancing right lung atelectasis. Atelectatic changes to the major airways which otherwise appear patent. Mild enhancing atelectasis in the dependent left lower lobe. No other pulmonary process. Upper Abdomen: Reported separately today. Musculoskeletal: No acute osseous abnormality identified.  Review of the MIP images confirms the above findings. IMPRESSION: 1. Respiratory motion but good contrast bolus timing with no convincing  pulmonary embolus. 2. Elevated right hemidiaphragm with small to moderate layering right pleural effusion and right greater than left lung atelectasis. 3. See also CT Abdomen and Pelvis reported separately. Electronically Signed   By: Genevie Ann M.D.   On: 04/21/2019 21:34   Ct Abdomen Pelvis W Contrast  Result Date: 04/21/2019 CLINICAL DATA:  68 year old male with lower chest pain, upper abdominal pain. Postoperative day 1 cholecystectomy. Chronic lymphocytic leukemia. EXAM: CT ABDOMEN AND PELVIS WITH CONTRAST TECHNIQUE: Multidetector CT imaging of the abdomen and pelvis was performed using the standard protocol following bolus administration of intravenous contrast. CONTRAST:  126mL OMNIPAQUE IOHEXOL 350 MG/ML SOLN COMPARISON:  CTA chest today reported separately. CT Abdomen and Pelvis 01/16/2017. FINDINGS: Lower chest: Reported separately today. Elevation of the right hemidiaphragm is increased since 2018. Hepatobiliary: Ventral abdominal wall incision with no adverse features. Trace gas in the left rectus muscle. Small volume perihepatic fluid with simple fluid density. Regional mild inflammatory stranding. Surgical clips in the gallbladder fossa where a small volume of fluid and trace gas is present. Mild pneumobilia, mostly non dependent in the left lobe. More moderate volume gas in the CBD. The CBD measures 8-9 millimeters, with no stone or other filling defect identified along its course. Pancreas: Pancreas remains within normal limits. Spleen: Negative. Adrenals/Urinary Tract: Normal adrenal glands. Bilateral renal enhancement and contrast excretion is symmetric and normal. Normal proximal ureters. No nephrolithiasis. Decompressed ureters. Unremarkable urinary bladder. Stomach/Bowel: Chronic rectosigmoid anastomosis with a nearby surgical clip is stable since 2018 with no adverse features. Mildly redundant but otherwise negative sigmoid. Negative descending colon. Redundant splenic flexure. The hepatic  flexure remains within normal limits despite some regional inflammation. Negative right colon. Prior appendectomy, stable. Negative terminal ileum. No dilated small bowel. Mostly decompressed stomach and duodenum. There is a small volume of fluid in the gastrohepatic ligament and adjacent to the distal esophagus on series 4, image 15. There is evidence of a 2 centimeter duodenal diverticulum on coronal image 39. Trace pneumoperitoneum including between the left lobe of the liver and the diaphragm. Vascular/Lymphatic: Mild Aortoiliac calcified atherosclerosis. Major arterial structures are patent. Portal venous system is patent. Reproductive: Lower inguinal/space of Retzius prior hernia repair with mesh is stable since 2018. Otherwise negative. Other: Small volume free fluid in the right hemipelvis with slightly complex fluid density. There is also an associated small layering 4 millimeter calculus in the right hemipelvis on series 4, image 86. Musculoskeletal: Lower lumbar spine degeneration. No acute osseous abnormality identified. IMPRESSION: 1. Recent cholecystectomy with satisfactory appearance of the surgical bed. Small volume perihepatic fluid.  Trace pneumoperitoneum. Pneumobilia, with 8-9 mm diameter CBD, but no other CBD filling defect. Small volume of mildly complex fluid layering in the pelvis along with a 4 mm calculus which might be a dropped gallstone (series 4, image 86). Ventral abdominal incision with no adverse features. 2. No other acute or inflammatory process in the abdomen or pelvis. 3. Lower chest findings reported with chest CTA today separately. Electronically Signed   By: Genevie Ann M.D.   On: 04/21/2019 21:44    Assessment: s/p  Patient Active Problem List   Diagnosis Date Noted  . Post-operative complication [id number]  . Acute respiratory failure with hypoxia ([place]) 04/21/2019  . SIRS (systemic inflammatory response syndrome) ([place]) 04/21/2019  . Status post laparoscopic  cholecystectomy 04/21/2019  . HCAP (healthcare-associated pneumonia)  04/21/2019  . CLL (chronic lymphocytic leukemia) ([place]) 04/21/2019  . Calculous cholecystitis 02/28/2019  . Campylobacter diarrhea   . Diarrhea of infectious origin   . STEC (Shiga toxin-producing Escherichia coli)   . Elevated LFTs   . Hypokalemia   . Gastroenteritis 01/19/2017  . Sepsis ([place]) 01/19/2017  . GERD (gastroesophageal reflux disease) 01/19/2017  . Depression 01/19/2017    Post operative pain  Plan: Pt with fevers and pain after outpt lap chole.    CT scan shows normal post operative findings WBC elevated due to normal post surgical process LFT's normal Will get HIDA scan to rule out early bile leak Dr Redmond Pulling notified and will cont to follow pt and HIDA results   LOS: 1 day     .Rosario Adie, [facility], [place]   04/22/2019 7:14 AM

## 2019-04-22 NOTE — Plan of Care (Signed)
Continue current POC 

## 2019-04-23 DIAGNOSIS — K219 Gastro-esophageal reflux disease without esophagitis: Secondary | ICD-10-CM

## 2019-04-23 DIAGNOSIS — I10 Essential (primary) hypertension: Secondary | ICD-10-CM

## 2019-04-23 LAB — CBC
HCT: 32.5 % — ABNORMAL LOW (ref 39.0–52.0)
Hemoglobin: 10.3 g/dL — ABNORMAL LOW (ref 13.0–17.0)
MCH: 32.2 pg (ref 26.0–34.0)
MCHC: 31.7 g/dL (ref 30.0–36.0)
MCV: 101.6 fL — ABNORMAL HIGH (ref 80.0–100.0)
Platelets: 140 10*3/uL — ABNORMAL LOW (ref 150–400)
RBC: 3.2 MIL/uL — ABNORMAL LOW (ref 4.22–5.81)
RDW: 13.6 % (ref 11.5–15.5)
WBC: 19 10*3/uL — ABNORMAL HIGH (ref 4.0–10.5)
nRBC: 0 % (ref 0.0–0.2)

## 2019-04-23 LAB — URINE CULTURE: Culture: NO GROWTH

## 2019-04-23 MED ORDER — CEFDINIR 300 MG PO CAPS: 300.0000 mg | ORAL_CAPSULE | Freq: Two times a day (BID) | ORAL | 0 refills | Status: AC

## 2019-04-23 MED ORDER — DOCUSATE SODIUM 100 MG PO CAPS: 100.0000 mg | ORAL_CAPSULE | Freq: Every day | ORAL | 0 refills | Status: DC | PRN

## 2019-04-23 NOTE — Care Management Important Message (Signed)
Important Message  Patient Details IM Letter given to Kathrin Greathouse SW to present to the Patient Name: Jonathon Parker MRN: [id number] Date of Birth: 06/06/1951   Medicare Important Message Given:  Yes     Kerin Salen 04/23/2019, 11:59 AM

## 2019-04-23 NOTE — Discharge Summary (Signed)
Bascom Levels [id number] DOB: 09-Feb-1951 DOA: 04/21/2019  PCP: Nicoletta Dress, MD  Admit date: 04/21/2019 Discharge date: 04/23/2019  Admitted From: Home Disposition: Home  Recommendations for Outpatient Follow-up:  1. Follow up with PCP in 1-2 weeks 2. Please obtain CBC in one week 3. New Medications: cefdenir x 5 days. Continue incentive spirometry use 4. Please follow up on the following pending results:  Home Health: No Equipment/Devices: None  Discharge Condition: Stable CODE STATUS: Full code Diet recommendation: Heart Healthy  Brief/Interim Summary: History of present illness:  Jonathon Parker is a 68 y.o. year old male with medical history significant for acute calculus cholecystitis status post ERCP with sphincterotomy on 03/01/2019, and percutaneous cholecystomy tube placement on 03/02/2019 who presented on 04/21/2019 with acute onset abdominal pain, shortness of breath and fever 1 day after interval lap cholecystectomy on  04/20/19 for chronic calculus cholecystitis.   Remaining hospital course addressed in problem based format below:   Hospital Course:    Sirs criteria, resolved.  Fever 100.8 on admission, tachypneic with oxygen desaturation to 83% requiring 2 L, worsening leukocytosis (30, baseline around 16-20).    Initial concern this was sepsis etiology given recent GI procedure as well as new hypoxia.  CT abdomen imaging showed no acute abnormalities, CTA chest ruled out PE and showed atelectasis as well as moderate-sized right pleural effusion.  Empiric vancomycin and cefepime were started, blood cultures remain negative.  Was able to transition to cefdinir on day of discharge.  Remained afebrile.  Presume most of fever was like related to postoperative atelectasis.  In case there is some respiratory component patient was discharged on cefdinir to continue for additional 5 days with close PCP follow-up.  To continue aggressive pulmonary toilet with incentive  spirometry, out of bed to chair, ambulation.  Of note has leukocytosis as baseline related to CLL, on discharge WBC of 19.   Acute hypoxic respiratory failure, resolved.  Oxygen desaturated to 83% on room air on admission requiring 2 L of oxygen by nasal cannula.  Presumed was related to postoperative atelectasis as mentioned above based off negative CTA chest for PE and signs of postoperative atelectasis also showed some moderate pleural effusion.  Improved significantly with incentive spirometry, will continue on discharge.  Was able to ambulate prior to discharge without requiring any oxygen and no desaturations.  Think pneumonia is less likely however given presentation will continue cefdinir x5 days on discharge   Status post laparoscopic cholecystectomy (04/20/2019).   acute calculus cholecystitis previously managed by percutaneous cholecystostomy tube.    HIDA scan (9/17) showed no postop bile leak or biliary obstruction.  Laparoscopic sites look clean, dry, intact with no signs of infection.  CT abdomen showed no acute abnormalities.  Presume pain was residual from recent procedure as patient's pain improved significantly within 48 hours only requiring previous oral home regimen which he will continue on discharge.     CLL, chronic, stable.  Has chronically elevated white count related to CLL.  Continue to monitor   Depression, stable.  Continue Wellbutrin, trazodone, Xanax as needed   Hypertension, stable.  Continue lisinopril   GERD, stable continue PPI    Consultations:  Surgery  Procedures/Studies: HIDA scan, 9/17 no evidence of postop bile leak or biliary obstruction Subjective: Feels much better Was able to tolerate breakfast Having almost no belly pain, well controlled with oral pain medication Breathing very well Anxious to go home Discharge Exam: Vitals:   04/22/19 2146 04/23/19 0526  BP: (!) 147/84  132/80  Pulse: 62 62  Resp: 16 16  Temp: 98.5 F (36.9  C) 98.3 F (36.8 C)  SpO2: 97% 96%   Vitals:   04/22/19 1458 04/22/19 1543 04/22/19 2146 04/23/19 0526  BP:   (!) 147/84 132/80  Pulse:   62 62  Resp: 20  16 16   Temp:   98.5 F (36.9 C) 98.3 F (36.8 C)  TempSrc:   Oral Oral  SpO2:  98% 97% 96%  Weight:      Height:        General: Lying in bed, no apparent distress Eyes: EOMI, anicteric ENT: Oral Mucosa clear and moist Cardiovascular: regular rate and rhythm, no murmurs, rubs or gallops, no edema, Respiratory: Normal respiratory effort on room air, lungs clear to auscultation bilaterally Abdomen: soft, non-distended, non-tender, normal bowel sounds Skin: No Rash Neurologic: Grossly no focal neuro deficit.Mental status AAOx3, speech normal, Psychiatric:Appropriate affect, and mood  Discharge Diagnoses:  Principal Problem:   Post-operative complication Active Problems:   GERD (gastroesophageal reflux disease)   Depression   Acute respiratory failure with hypoxia (HCC)   SIRS (systemic inflammatory response syndrome) (HCC)   Status post laparoscopic cholecystectomy   HCAP (healthcare-associated pneumonia)   CLL (chronic lymphocytic leukemia) ([place])   Essential hypertension    Discharge Instructions  Discharge Instructions    Diet - low sodium heart healthy   Complete by: As directed    Increase activity slowly   Complete by: As directed      Allergies as of 04/23/2019   No Known Allergies     Medication List    TAKE these medications   acetaminophen 325 MG tablet Commonly known as: TYLENOL Take 2 tablets (650 mg total) by mouth every 6 (six) hours as needed for mild pain (or temp > 100).   ALPRAZolam 0.25 MG tablet Commonly known as: XANAX Take 0.25 mg by mouth at bedtime as needed for anxiety.   buPROPion 300 MG 24 hr tablet Commonly known as: WELLBUTRIN XL Take 300 mg by mouth at bedtime.   cefdinir 300 MG capsule Commonly known as: OMNICEF Take 1 capsule (300 mg total) by mouth 2 (two) times  daily for 5 days.   docusate sodium 100 MG capsule Commonly known as: COLACE Take 1 capsule (100 mg total) by mouth daily as needed for mild constipation.   esomeprazole 40 MG capsule Commonly known as: NEXIUM Take 40 mg by mouth daily.   lisinopril 20 MG tablet Commonly known as: ZESTRIL Take 20 mg by mouth daily.   oxyCODONE 5 MG immediate release tablet Commonly known as: Oxy IR/ROXICODONE Take 1 tablet (5 mg total) by mouth every 6 (six) hours as needed for severe pain.   traZODone 100 MG tablet Commonly known as: DESYREL Take 100 mg by mouth at bedtime.   triamcinolone cream 0.1 % Commonly known as: KENALOG Apply 1 application topically daily as needed (rash).       No Known Allergies      The results of significant diagnostics from this hospitalization (including imaging, microbiology, ancillary and laboratory) are listed below for reference.     Microbiology: Recent Results (from the past 240 hour(s))  Novel Coronavirus, NAA (Hosp order, Send-out to Ref Lab; TAT 18-24 hrs     Status: None   Collection Time: 04/16/19  2:25 PM   Specimen: Nasopharyngeal Swab; Respiratory  Result Value Ref Range Status   SARS-CoV-2, NAA NOT DETECTED NOT DETECTED Final    Comment: (NOTE) This nucleic acid  amplification test was developed and its performance characteristics determined by [employer]. Nucleic acid amplification tests include PCR and TMA. This test has not been FDA cleared or approved. This test has been authorized by FDA under an Emergency Use Authorization (EUA). This test is only authorized for the duration of time the declaration that circumstances exist justifying the authorization of the emergency use of in vitro diagnostic tests for detection of SARS-CoV-2 virus and/or diagnosis of COVID-19 infection under section 564(b)(1) of the Act, 21 U.S.C. [id number]) (1), unless the authorization is terminated or revoked sooner. When diagnostic testing  is negative, the possibility of a false negative result should be considered in the context of a patient's recent exposures and the presence of clinical signs and symptoms consistent with COVID-19. An individual without symptoms of COVID- 19 and who is not shedding SARS-CoV-2 vi rus would expect to have a negative (not detected) result in this assay. Performed At: [facility] [address] [place], [place] [id number] Rush Farmer MD [id number]    [place]  Final    Comment: Performed at [facility], [place] [address], [place], [place] [address]  Blood culture (routine x 2)     Status: None (Preliminary result)   Collection Time: 04/21/19  8:46 PM   Specimen: BLOOD  Result Value Ref Range Status   Specimen Description   Final    BLOOD RIGHT ANTECUBITAL Performed at [facility] [address], [place], [place] [address]    Special Requests   Final    BOTTLES DRAWN AEROBIC AND ANAEROBIC Blood Culture results may not be optimal due to an excessive volume of blood received in culture bottles Performed at [facility] [address] [place], [place] [address]    Culture   Final    NO GROWTH < 12 HOURS Performed at [facility] [address] [place], [place] [address]    Report Status PENDING  Incomplete  Blood culture (routine x 2)     Status: None (Preliminary result)   Collection Time: 04/21/19  8:46 PM   Specimen: BLOOD RIGHT HAND  Result Value Ref Range Status   Specimen Description   Final    BLOOD RIGHT HAND Performed at [facility] [address] [place], [place] [address]    Special Requests   Final    BOTTLES DRAWN AEROBIC AND ANAEROBIC Blood Culture results may not be optimal due to an inadequate volume of blood received in culture bottles Performed at [facility] [address] [place], [place] [address]    Culture   Final    NO  GROWTH < 12 HOURS Performed at [facility] [address], [place],  [address]    Report Status PENDING  Incomplete  SARS Coronavirus 2 [facility] order, Performed in [facility] hospital lab) Nasopharyngeal Nasopharyngeal Swab     Status: None   Collection Time: 04/21/19  8:58 PM   Specimen: Nasopharyngeal Swab  Result Value Ref Range Status   SARS Coronavirus 2 NEGATIVE NEGATIVE Final    Comment: (NOTE) If result is NEGATIVE SARS-CoV-2 target nucleic acids are NOT DETECTED. The SARS-CoV-2 RNA is generally detectable in upper and lower  respiratory specimens during the acute phase of infection. The lowest  concentration of SARS-CoV-2 viral copies this assay can detect is 250  copies / mL. A negative result does not preclude SARS-CoV-2 infection  and should not be used as  the sole basis for treatment or other  patient management decisions.  A negative result may occur with  improper specimen collection / handling, submission of specimen other  than nasopharyngeal swab, presence of viral mutation(s) within the  areas targeted by this assay, and inadequate number of viral copies  (<250 copies / mL). A negative result must be combined with clinical  observations, patient history, and epidemiological information. If result is POSITIVE SARS-CoV-2 target nucleic acids are DETECTED. The SARS-CoV-2 RNA is generally detectable in upper and lower  respiratory specimens dur ing the acute phase of infection.  Positive  results are indicative of active infection with SARS-CoV-2.  Clinical  correlation with patient history and other diagnostic information is  necessary to determine patient infection status.  Positive results do  not rule out bacterial infection or co-infection with other viruses. If result is PRESUMPTIVE POSTIVE SARS-CoV-2 nucleic acids MAY BE PRESENT.   A presumptive positive result was obtained on the submitted specimen  and confirmed on repeat testing.  While  2019 novel coronavirus  (SARS-CoV-2) nucleic acids may be present in the submitted sample  additional confirmatory testing may be necessary for epidemiological  and / or clinical management purposes  to differentiate between  SARS-CoV-2 and other Sarbecovirus currently known to infect humans.  If clinically indicated additional testing with an alternate test  methodology [phone]) is advised. The SARS-CoV-2 RNA is generally  detectable in upper and lower respiratory sp ecimens during the acute  phase of infection. The expected result is Negative. Fact Sheet for Patients:  StrictlyIdeas.no Fact Sheet for Healthcare Providers: BankingDealers.co.za This test is not yet approved or cleared by the [place] FDA and has been authorized for detection and/or diagnosis of SARS-CoV-2 by FDA under an Emergency Use Authorization (EUA).  This EUA will remain in effect (meaning this test can be used) for the duration of the COVID-19 declaration under Section 564(b)(1) of the Act, 21 U.S.C. section 360bbb-3(b)(1), unless the authorization is terminated or revoked sooner. Performed at [facility], [place] [address], [place], [place] [address]   Urine culture     Status: None   Collection Time: 04/21/19 11:36 PM   Specimen: Urine, Random  Result Value Ref Range Status   Specimen Description   Final    URINE, RANDOM Performed at [facility], [place] [address], [place], [place] [address]    Special Requests   Final    NONE Performed at [facility], [place] [address] [place], [place] [address]    Culture   Final    NO GROWTH Performed at [facility], [place] [address] [place], [place] [address]    Report Status 04/23/2019 FINAL  Final  MRSA PCR Screening     Status: None   Collection Time: 04/22/19  1:10 AM   Specimen: Nasal Mucosa; Nasopharyngeal  Result Value Ref Range Status   MRSA by PCR  NEGATIVE NEGATIVE Final    Comment:        The GeneXpert MRSA Assay (FDA approved for NASAL specimens only), is one component of a comprehensive MRSA colonization surveillance program. It is not intended to diagnose MRSA infection nor to guide or monitor treatment for MRSA infections. Performed at [facility], [place] [address] [place], [place] [address]      Labs: BNP (last 3 results) No results for input(s): BNP in the last 8760 hours. Basic Metabolic Panel: Recent Labs  Lab 04/20/19 1240 04/21/19 1942 04/22/19  0251 04/22/19 1658  NA 140 141 141 140  K 3.9 4.0 4.1 4.1  CL 105 102 109 102  CO2 27 29 26 28   GLUCOSE 91 115* 109* 109*  BUN 10 13 14 12   CREATININE 1.20 1.26* 1.08 0.98  CALCIUM 9.1 9.0 8.4* 8.9   Liver Function Tests: Recent Labs  Lab 04/20/19 1240 04/21/19 1942 04/22/19 0251  AST 19 31 23   ALT 20 31 26   ALKPHOS 89 68 58  BILITOT 1.2 1.2 1.8*  PROT 7.1 6.4* 5.5*  ALBUMIN 4.2 3.8 3.1*   Recent Labs  Lab 04/21/19 2046  LIPASE 25   No results for input(s): AMMONIA in the last 168 hours. CBC: Recent Labs  Lab 04/19/19 0830 04/21/19 1942 04/22/19 0251 04/23/19 0253  WBC 24.3* 30.6* 23.8* 19.0*  NEUTROABS 2.8 9.5*  --   --   HGB 14.3 13.3 11.9* 10.3*  HCT 44.6 41.2 37.4* 32.5*  MCV 98.5 99.5 99.7 101.6*  PLT 197 149* 141* 140*   Cardiac Enzymes: No results for input(s): CKTOTAL, CKMB, CKMBINDEX, TROPONINI in the last 168 hours. BNP: Invalid input(s): POCBNP CBG: No results for input(s): GLUCAP in the last 168 hours. D-Dimer No results for input(s): DDIMER in the last 72 hours. Hgb A1c No results for input(s): HGBA1C in the last 72 hours. Lipid Profile No results for input(s): CHOL, HDL, LDLCALC, TRIG, CHOLHDL, LDLDIRECT in the last 72 hours. Thyroid function studies No results for input(s): TSH, T4TOTAL, T3FREE, THYROIDAB in the last 72 hours.  Invalid input(s): FREET3 Anemia work up No results for input(s):  VITAMINB12, FOLATE, FERRITIN, TIBC, IRON, RETICCTPCT in the last 72 hours. Urinalysis    Component Value Date/Time   COLORURINE YELLOW 04/21/2019 2046   APPEARANCEUR CLEAR 04/21/2019 2046   LABSPEC 1.040 (H) 04/21/2019 2046   PHURINE 5.0 04/21/2019 2046   GLUCOSEU NEGATIVE 04/21/2019 2046   HGBUR NEGATIVE 04/21/2019 2046   BILIRUBINUR NEGATIVE 04/21/2019 2046   KETONESUR NEGATIVE 04/21/2019 2046   PROTEINUR NEGATIVE 04/21/2019 2046   NITRITE NEGATIVE 04/21/2019 2046   LEUKOCYTESUR NEGATIVE 04/21/2019 2046   Sepsis Labs Invalid input(s): PROCALCITONIN,  WBC,  LACTICIDVEN Microbiology Recent Results (from the past 240 hour(s))  Novel Coronavirus, NAA (Hosp order, Send-out to Ref Lab; TAT 18-24 hrs     Status: None   Collection Time: 04/16/19  2:25 PM   Specimen: Nasopharyngeal Swab; Respiratory  Result Value Ref Range Status   SARS-CoV-2, NAA NOT DETECTED NOT DETECTED Final    Comment: (NOTE) This nucleic acid amplification test was developed and its performance characteristics determined by [employer] and [employer]. Nucleic acid amplification tests include PCR and TMA. This test has not been FDA cleared or approved. This test has been authorized by FDA under an Emergency Use Authorization (EUA). This test is only authorized for the duration of time the declaration that circumstances exist justifying the authorization of the emergency use of in vitro diagnostic tests for detection of SARS-CoV-2 virus and/or diagnosis of COVID-19 infection under section 564(b)(1) of the Act, 21 U.S.C. [id number]) (1), unless the authorization is terminated or revoked sooner. When diagnostic testing is negative, the possibility of a false negative result should be considered in the context of a patient's recent exposures and the presence of clinical signs and symptoms consistent with COVID-19. An individual without symptoms of COVID- 19 and who is not shedding SARS-CoV-2 vi rus would expect to have  a negative (not detected) result in this assay. Performed At: [facility] [place],  [place] [id number] Rush Farmer MD [id number]    Coronavirus Source NASOPHARYNGEAL  Final    Comment: Performed at [facility], [place] [address] [place], [place] [address]  Blood culture (routine x 2)     Status: None (Preliminary result)   Collection Time: 04/21/19  8:46 PM   Specimen: BLOOD  Result Value Ref Range Status   Specimen Description   Final    BLOOD RIGHT ANTECUBITAL Performed at [facility] [address] [place], [place] [address]    Special Requests   Final    BOTTLES DRAWN AEROBIC AND ANAEROBIC Blood Culture results may not be optimal due to an excessive volume of blood received in culture bottles Performed at [facility] [address] [place], [place] [address]    Culture   Final    NO GROWTH < 12 HOURS Performed at [facility] [address], [place], [place] [address]    Report Status PENDING  Incomplete  Blood culture (routine x 2)     Status: None (Preliminary result)   Collection Time: 04/21/19  8:46 PM   Specimen: BLOOD RIGHT HAND  Result Value Ref Range Status   Specimen Description   Final    BLOOD RIGHT HAND Performed at [facility] [address] [place], [place] [address]    Special Requests   Final    BOTTLES DRAWN AEROBIC AND ANAEROBIC Blood Culture results may not be optimal due to an inadequate volume of blood received in culture bottles Performed at [facility] [address] [place], [place] [address]    Culture   Final    NO GROWTH < 12 HOURS Performed at [facility] [address], [place], [place] [address]    Report Status PENDING  Incomplete  SARS Coronavirus 2 [facility] order, Performed in [facility] hospital lab) Nasopharyngeal Nasopharyngeal Swab     Status: None   Collection Time: 04/21/19  8:58 PM   Specimen:  Nasopharyngeal Swab  Result Value Ref Range Status   SARS Coronavirus 2 NEGATIVE NEGATIVE Final    Comment: (NOTE) If result is NEGATIVE SARS-CoV-2 target nucleic acids are NOT DETECTED. The SARS-CoV-2 RNA is generally detectable in upper and lower  respiratory specimens during the acute phase of infection. The lowest  concentration of SARS-CoV-2 viral copies this assay can detect is 250  copies / mL. A negative result does not preclude SARS-CoV-2 infection  and should not be used as the sole basis for treatment or other  patient management decisions.  A negative result may occur with  improper specimen collection / handling, submission of specimen other  than nasopharyngeal swab, presence of viral mutation(s) within the  areas targeted by this assay, and inadequate number of viral copies  (<250 copies / mL). A negative result must be combined with clinical  observations, patient history, and epidemiological information. If result is POSITIVE SARS-CoV-2 target nucleic acids are DETECTED. The SARS-CoV-2 RNA is generally detectable in upper and lower  respiratory specimens dur ing the acute phase of infection.  Positive  results are indicative of active infection with SARS-CoV-2.  Clinical  correlation with patient history and other diagnostic information is  necessary to determine patient infection status.  Positive results do  not rule out bacterial infection or co-infection with other viruses. If result is PRESUMPTIVE POSTIVE SARS-CoV-2 nucleic acids MAY BE PRESENT.   A presumptive positive result was obtained on the submitted specimen  and confirmed on repeat testing.  While 2019 novel coronavirus  (SARS-CoV-2) nucleic acids may be present in the submitted sample  additional confirmatory testing may be necessary for epidemiological  and / or clinical management purposes  to differentiate between  SARS-CoV-2 and other Sarbecovirus currently known to infect humans.  If clinically  indicated additional testing with an alternate test  methodology [phone]) is advised. The SARS-CoV-2 RNA is generally  detectable in upper and lower respiratory sp ecimens during the acute  phase of infection. The expected result is Negative. Fact Sheet for Patients:  StrictlyIdeas.no Fact Sheet for Healthcare Providers: BankingDealers.co.za This test is not yet approved or cleared by the [place] FDA and has been authorized for detection and/or diagnosis of SARS-CoV-2 by FDA under an Emergency Use Authorization (EUA).  This EUA will remain in effect (meaning this test can be used) for the duration of the COVID-19 declaration under Section 564(b)(1) of the Act, 21 U.S.C. section 360bbb-3(b)(1), unless the authorization is terminated or revoked sooner. Performed at [facility], [place] [address], [place], [place] [address]   Urine culture     Status: None   Collection Time: 04/21/19 11:36 PM   Specimen: Urine, Random  Result Value Ref Range Status   Specimen Description   Final    URINE, RANDOM Performed at [facility], [place] [address], [place], [place] [address]    Special Requests   Final    NONE Performed at [facility], [place] [address] [place], [place] [address]    Culture   Final    NO GROWTH Performed at [facility], [place] [address], [place], [place] [address]    Report Status 04/23/2019 FINAL  Final  MRSA PCR Screening     Status: None   Collection Time: 04/22/19  1:10 AM   Specimen: Nasal Mucosa; Nasopharyngeal  Result Value Ref Range Status   MRSA by PCR NEGATIVE NEGATIVE Final    Comment:        The GeneXpert MRSA Assay (FDA approved for NASAL specimens only), is one component of a comprehensive MRSA colonization surveillance program. It is not intended to diagnose MRSA infection nor to guide or monitor treatment for MRSA infections. Performed at [facility], [place] [address], [place],  [address]      Time coordinating discharge: Over 30 minutes  SIGNED:   Desiree Hane, MD  [facility] 04/23/2019, 11:48 AM Pager   If 7PM-7AM, please contact night-coverage [email or web address] Password [email or web address]

## 2019-04-26 LAB — CULTURE, BLOOD (ROUTINE X 2)
Culture: NO GROWTH
Culture: NO GROWTH

## 2019-05-07 ENCOUNTER — Telehealth: Payer: Self-pay | Admitting: General Surgery

## 2019-05-07 NOTE — Telephone Encounter (Signed)
Patient underwent interval cholecystectomy on September 15 after undergoing percutaneous cholecystostomy tube placement about 6 weeks prior.  Patient has CLL.  He re-presented within 24 hours after his surgery with high fevers and abdominal pain and some shortness of breath.  Admitted for a few days.  CT scans were unremarkable for complication from cholecystectomy.  He had a small right pleural effusion and atelectasis.  Empiric antibiotics were started.  HIDA scan was negative for bile leak and he was discharged  Patient called into the office late yesterday afternoon complaining's of persistent low-grade temperatures at night and night sweats and temperatures up to 101, shortness of breath, shortness of breath with walking around.  He reports getting full him a small amount of food.  He was advised to contact his primary care doctor and/or go to the emergency room for evaluation  I spoke with him this afternoon.  He had seen his primary care doctors PA earlier in the week.  He still continuing to have night sweats and fevers.  No vomiting or nausea.  Has some mild abdominal pain at the umbilical area.  Main complaint is night sweats, fevers and shortness of breath  Advised him that I expect him to have some mild discomfort at the umbilicus since that was the extraction site.  Suspicion for intra-abdominal complication is low since his abdominal pain is improving.  More concerned about perhaps some type of pneumonia given mainly pulmonary symptoms and fever  Advised patient that I would recommend evaluation at an urgent care or emergency room facility especially given his CLL.  Patient voiced understanding.  I advised patient that I am out of the office next week but should he be found to have an intra-abdominal process 1 of my partners would be able to manage it but again my suspicion is very low  Leighton Ruff. Redmond Pulling, MD, FACS General, Bariatric, & Minimally Invasive Surgery [facility],  [place]

## 2019-05-10 ENCOUNTER — Inpatient Hospital Stay (HOSPITAL_BASED_OUTPATIENT_CLINIC_OR_DEPARTMENT_OTHER)
Admission: EM | Admit: 2019-05-10 | Discharge: 2019-05-13 | DRG: 856 | Disposition: A | Discharge status: Home / Self Care | Payer: Medicare Other | Attending: Family Medicine | Admitting: Family Medicine

## 2019-05-10 ENCOUNTER — Other Ambulatory Visit: Payer: Self-pay

## 2019-05-10 ENCOUNTER — Encounter (HOSPITAL_BASED_OUTPATIENT_CLINIC_OR_DEPARTMENT_OTHER): Payer: Self-pay | Admitting: Emergency Medicine

## 2019-05-10 ENCOUNTER — Emergency Department (HOSPITAL_BASED_OUTPATIENT_CLINIC_OR_DEPARTMENT_OTHER): Payer: Medicare Other

## 2019-05-10 DIAGNOSIS — R0902 Hypoxemia: Secondary | ICD-10-CM | POA: Diagnosis present

## 2019-05-10 DIAGNOSIS — F329 Major depressive disorder, single episode, unspecified: Secondary | ICD-10-CM | POA: Diagnosis present

## 2019-05-10 DIAGNOSIS — Z9049 Acquired absence of other specified parts of digestive tract: Secondary | ICD-10-CM | POA: Diagnosis not present

## 2019-05-10 DIAGNOSIS — C9110 Chronic lymphocytic leukemia of B-cell type not having achieved remission: Secondary | ICD-10-CM | POA: Diagnosis present

## 2019-05-10 DIAGNOSIS — K219 Gastro-esophageal reflux disease without esophagitis: Secondary | ICD-10-CM | POA: Diagnosis present

## 2019-05-10 DIAGNOSIS — J9811 Atelectasis: Secondary | ICD-10-CM | POA: Diagnosis present

## 2019-05-10 DIAGNOSIS — Z86010 Personal history of colonic polyps: Secondary | ICD-10-CM | POA: Diagnosis not present

## 2019-05-10 DIAGNOSIS — Y838 Other surgical procedures as the cause of abnormal reaction of the patient, or of later complication, without mention of misadventure at the time of the procedure: Secondary | ICD-10-CM | POA: Diagnosis present

## 2019-05-10 DIAGNOSIS — I10 Essential (primary) hypertension: Secondary | ICD-10-CM | POA: Diagnosis present

## 2019-05-10 DIAGNOSIS — F1020 Alcohol dependence, uncomplicated: Secondary | ICD-10-CM | POA: Diagnosis present

## 2019-05-10 DIAGNOSIS — D1803 Hemangioma of intra-abdominal structures: Secondary | ICD-10-CM | POA: Diagnosis present

## 2019-05-10 DIAGNOSIS — R0600 Dyspnea, unspecified: Secondary | ICD-10-CM

## 2019-05-10 DIAGNOSIS — K5700 Diverticulitis of small intestine with perforation and abscess without bleeding: Secondary | ICD-10-CM | POA: Diagnosis present

## 2019-05-10 DIAGNOSIS — D473 Essential (hemorrhagic) thrombocythemia: Secondary | ICD-10-CM

## 2019-05-10 DIAGNOSIS — T8143XA Infection following a procedure, organ and space surgical site, initial encounter: Secondary | ICD-10-CM | POA: Diagnosis present

## 2019-05-10 DIAGNOSIS — Z79899 Other long term (current) drug therapy: Secondary | ICD-10-CM

## 2019-05-10 DIAGNOSIS — K750 Abscess of liver: Secondary | ICD-10-CM | POA: Diagnosis present

## 2019-05-10 DIAGNOSIS — Z79891 Long term (current) use of opiate analgesic: Secondary | ICD-10-CM | POA: Diagnosis not present

## 2019-05-10 DIAGNOSIS — Z20828 Contact with and (suspected) exposure to other viral communicable diseases: Secondary | ICD-10-CM | POA: Diagnosis present

## 2019-05-10 DIAGNOSIS — K651 Peritoneal abscess: Secondary | ICD-10-CM | POA: Diagnosis present

## 2019-05-10 DIAGNOSIS — R932 Abnormal findings on diagnostic imaging of liver and biliary tract: Secondary | ICD-10-CM | POA: Diagnosis present

## 2019-05-10 DIAGNOSIS — D75839 Thrombocytosis, unspecified: Secondary | ICD-10-CM

## 2019-05-10 DIAGNOSIS — R188 Other ascites: Secondary | ICD-10-CM

## 2019-05-10 LAB — CBC WITH DIFFERENTIAL/PLATELET
Abs Immature Granulocytes: 0.07 10*3/uL (ref 0.00–0.07)
Basophils Absolute: 0.1 10*3/uL (ref 0.0–0.1)
Basophils Relative: 0 %
Eosinophils Absolute: 0.1 10*3/uL (ref 0.0–0.5)
Eosinophils Relative: 0 %
HCT: 38.5 % — ABNORMAL LOW (ref 39.0–52.0)
Hemoglobin: 11.9 g/dL — ABNORMAL LOW (ref 13.0–17.0)
Immature Granulocytes: 0 %
Lymphocytes Relative: 73 %
Lymphs Abs: 20.5 10*3/uL — ABNORMAL HIGH (ref 0.7–4.0)
MCH: 30.5 pg (ref 26.0–34.0)
MCHC: 30.9 g/dL (ref 30.0–36.0)
MCV: 98.7 fL (ref 80.0–100.0)
Monocytes Absolute: 0.7 10*3/uL (ref 0.1–1.0)
Monocytes Relative: 3 %
Neutro Abs: 6.6 10*3/uL (ref 1.7–7.7)
Neutrophils Relative %: 24 %
Platelets: 592 10*3/uL — ABNORMAL HIGH (ref 150–400)
RBC: 3.9 MIL/uL — ABNORMAL LOW (ref 4.22–5.81)
RDW: 12.9 % (ref 11.5–15.5)
WBC: 28.1 10*3/uL — ABNORMAL HIGH (ref 4.0–10.5)
nRBC: 0 % (ref 0.0–0.2)

## 2019-05-10 LAB — COMPREHENSIVE METABOLIC PANEL
ALT: 75 U/L — ABNORMAL HIGH (ref 0–44)
AST: 30 U/L (ref 15–41)
Albumin: 3.6 g/dL (ref 3.5–5.0)
Alkaline Phosphatase: 391 U/L — ABNORMAL HIGH (ref 38–126)
Anion gap: 11 (ref 5–15)
BUN: 14 mg/dL (ref 8–23)
CO2: 25 mmol/L (ref 22–32)
Calcium: 9.1 mg/dL (ref 8.9–10.3)
Chloride: 99 mmol/L (ref 98–111)
Creatinine, Ser: 1.03 mg/dL (ref 0.61–1.24)
GFR calc Af Amer: >60 mL/min (ref >60)
GFR calc non Af Amer: >60 mL/min (ref >60)
Glucose, Bld: 106 mg/dL — ABNORMAL HIGH (ref 70–99)
Potassium: 3.5 mmol/L (ref 3.5–5.1)
Sodium: 135 mmol/L (ref 135–145)
Total Bilirubin: 0.7 mg/dL (ref 0.3–1.2)
Total Protein: 7.9 g/dL (ref 6.5–8.1)

## 2019-05-10 LAB — PROTIME-INR
INR: 1 (ref 0.8–1.2)
Prothrombin Time: 13.3 seconds (ref 11.4–15.2)

## 2019-05-10 LAB — URINALYSIS, ROUTINE W REFLEX MICROSCOPIC
Bilirubin Urine: NEGATIVE
Glucose, UA: NEGATIVE mg/dL
Hgb urine dipstick: NEGATIVE
Ketones, ur: NEGATIVE mg/dL
Leukocytes,Ua: NEGATIVE
Nitrite: NEGATIVE
Protein, ur: NEGATIVE mg/dL
Specific Gravity, Urine: 1.02 (ref 1.005–1.030)
pH: 6 (ref 5.0–8.0)

## 2019-05-10 LAB — TROPONIN I (HIGH SENSITIVITY)
Troponin I (High Sensitivity): 4 ng/L (ref <18)
Troponin I (High Sensitivity): 4 ng/L (ref <18)

## 2019-05-10 LAB — APTT: aPTT: 33 seconds (ref 24–36)

## 2019-05-10 LAB — SARS CORONAVIRUS 2 BY RT PCR (HOSPITAL ORDER, PERFORMED IN ~~LOC~~ HOSPITAL LAB): SARS Coronavirus 2: NEGATIVE

## 2019-05-10 LAB — LACTIC ACID, PLASMA: Lactic Acid, Venous: 1 mmol/L (ref 0.5–1.9)

## 2019-05-10 MED ORDER — SODIUM CHLORIDE 0.9 % IV SOLN: 2.0000 g | Freq: Once | INTRAVENOUS | Status: DC

## 2019-05-10 MED ORDER — SODIUM CHLORIDE 0.9 % IV SOLN
INTRAVENOUS | Status: DC
  Administered 2019-05-11: 01:00:00 via INTRAVENOUS

## 2019-05-10 MED ORDER — PIPERACILLIN-TAZOBACTAM 3.375 G IVPB
3.3750 g | Freq: Three times a day (TID) | INTRAVENOUS | Status: DC
  Administered 2019-05-11 – 2019-05-12 (×5): 3.375 g via INTRAVENOUS
  Filled 2019-05-10 (×5): qty 50

## 2019-05-10 MED ORDER — METRONIDAZOLE IN NACL 5-0.79 MG/ML-% IV SOLN: 500.0000 mg | Freq: Once | INTRAVENOUS | Status: DC

## 2019-05-10 MED ORDER — IOHEXOL 300 MG/ML  SOLN
100.0000 mL | Freq: Once | INTRAMUSCULAR | Status: AC | PRN
  Administered 2019-05-10: 100 mL via INTRAVENOUS

## 2019-05-10 MED ORDER — PIPERACILLIN-TAZOBACTAM 3.375 G IVPB 30 MIN
3.3750 g | Freq: Once | INTRAVENOUS | Status: AC
  Administered 2019-05-10: 3.375 g via INTRAVENOUS
  Filled 2019-05-10 (×2): qty 50

## 2019-05-10 NOTE — ED Provider Notes (Signed)
[facility] EMERGENCY DEPARTMENT Provider Note   CSN: [id number] Arrival date & time: 05/10/19  1344     History   Chief Complaint Chief Complaint  Patient presents with   Shortness of Breath    HPI Jonathon Parker is a 68 y.o. male with PMHx CLL, HTN, GERD, and depression who presents to the ED today complaining of continued shortness of breath s/p cholecystectomy 09/15. Per chart review pt had cholecystectomy done on 09/15. The next day he went to the ED for SOB and fevers. ED workup: sepsis workup, CTA chest, and CT A/P. No signs of PE. No intraabdominal infection present although given fevers/SOB/diagnosis of CLL pt admitted to medicine service. It appears that pt became hypoxic during admission requiring 2L nasal canulla with negative CTA suspected to be related to postop atelectasis. Pt had HIDA scan while in the hospital which showed no post op leak or biliary obstruction. He was discharged home on 09/18 after improvement of his symptoms. He was discharged with 5 days cefdinir and continued instructions for incentive spirometer.   Pt reports continued SOB and intermittent fevers with tmax 102.6 2 days ago. He reports being seen by general surgery PA the other day and then have a telephone encounter with Dr. Redmond Pulling who suggested pt go to an urgent care of ED for further evaluation to rule out PNA. He had low suspicion of intra-abdominal complication at that time.   Pt states he has some pain to his epigastrium region but denies nausea or vomiting. He is having normal bowel movements without issue. No other complaints at this time. Pt denies chest pain.      The history is provided by the patient and medical records.    Past Medical History:  Diagnosis Date   Cholecystitis    CLL (chronic lymphocytic leukemia) ([place])    stage 0, oncologist Dr. Karle Starch in [facility] hospital     Depression    GERD (gastroesophageal reflux disease)    controlled with nexium     Hypertension     Patient Active Problem List   Diagnosis Date Noted   Essential hypertension 04/22/2019   Post-operative complication 41/66/0630   Acute respiratory failure with hypoxia ([place]) 04/21/2019   SIRS (systemic inflammatory response syndrome) ([place]) 04/21/2019   Status post laparoscopic cholecystectomy 04/21/2019   HCAP (healthcare-associated pneumonia) 04/21/2019   CLL (chronic lymphocytic leukemia) ([place]) 04/21/2019   Calculous cholecystitis 02/28/2019   Campylobacter diarrhea    Diarrhea of infectious origin    STEC (Shiga toxin-producing Escherichia coli)    Elevated LFTs    Hypokalemia    Gastroenteritis 01/19/2017   Sepsis ([place]) 01/19/2017   GERD (gastroesophageal reflux disease) 01/19/2017   Depression 01/19/2017    Past Surgical History:  Procedure Laterality Date   APPENDECTOMY     CARPAL TUNNEL RELEASE     left    CHOLECYSTECTOMY N/A 04/20/2019   Procedure: LAPAROSCOPIC CHOLECYSTECTOMY;  Surgeon: Greer Pickerel, MD;  Location: [facility];  Service: General;  Laterality: N/A;   COLON SURGERY  2002   10 inches of colon taken out    COLONOSCOPY     ENDOSCOPIC RETROGRADE CHOLANGIOPANCREATOGRAPHY (ERCP) WITH PROPOFOL N/A 03/01/2019   Procedure: ENDOSCOPIC RETROGRADE CHOLANGIOPANCREATOGRAPHY (ERCP) WITH PROPOFOL;  Surgeon: [name], MD;  Location: [place];  Service: Gastroenterology;  Laterality: N/A;   ERCP  03/01/2019   HERNIA REPAIR     bilateral inguinal    IR CHOLANGIOGRAM EXISTING TUBE  03/25/2019   IR PERC CHOLECYSTOSTOMY  03/02/2019   REMOVAL OF STONES  03/01/2019   Procedure: REMOVAL OF STONES;  Surgeon: Rush Landmark Telford Nab., MD;  Location: [place];  Service: Gastroenterology;;   [name]  03/01/2019   Procedure: [name];  Surgeon: Rush Landmark Telford Nab., MD;  Location: [place];  Service: Gastroenterology;;        Home Medications    Prior to Admission medications   Medication Sig  Start Date End Date Taking? Authorizing Provider  buPROPion (WELLBUTRIN XL) 300 MG 24 hr tablet Take 300 mg by mouth at bedtime.   Yes [provider]  docusate sodium (COLACE) 100 MG capsule Take 1 capsule (100 mg total) by mouth daily as needed for mild constipation. 04/23/19  Yes Oretha Milch D, MD  esomeprazole (NEXIUM) 40 MG capsule Take 40 mg by mouth daily.    Yes [provider]  lisinopril (ZESTRIL) 20 MG tablet Take 20 mg by mouth daily.   Yes [provider]  traZODone (DESYREL) 100 MG tablet Take 100 mg by mouth at bedtime.   Yes [provider]  acetaminophen (TYLENOL) 325 MG tablet Take 2 tablets (650 mg total) by mouth every 6 (six) hours as needed for mild pain (or temp > 100). 03/07/19   Earnstine Regal, PA-C  ALPRAZolam Duanne Moron) 0.25 MG tablet Take 0.25 mg by mouth at bedtime as needed for anxiety.    [provider]  oxyCODONE (OXY IR/ROXICODONE) 5 MG immediate release tablet Take 1 tablet (5 mg total) by mouth every 6 (six) hours as needed for severe pain. 04/20/19   Greer Pickerel, MD  triamcinolone cream (KENALOG) 0.1 % Apply 1 application topically daily as needed (rash).    [provider]    Family History Family History  Problem Relation Age of Onset   Colon cancer Neg Hx    Esophageal cancer Neg Hx     Social History Social History   Tobacco Use   Smoking status: Never Smoker   Smokeless tobacco: Never Used  Substance Use Topics   Alcohol use: Yes    Comment: 1 drink before supper   Drug use: No     Allergies   Patient has no known allergies.   Review of Systems Review of Systems  Constitutional: Positive for fever. Negative for chills.  HENT: Negative for congestion.   Eyes: Negative for visual disturbance.  Respiratory: Positive for shortness of breath.   Cardiovascular: Negative for chest pain.  Gastrointestinal: Positive for abdominal pain. Negative for blood in stool, constipation,  diarrhea, nausea and vomiting.  Genitourinary: Negative for difficulty urinating.  Musculoskeletal: Negative for myalgias.  Skin: Negative for rash.  Neurological: Negative for headaches.     Physical Exam Updated Vital Signs BP 113/80 (BP Location: Right Arm)    Pulse 98    Temp 99 F (37.2 C) (Oral)    Resp 20    SpO2 100%   Physical Exam Vitals signs and nursing note reviewed.  Constitutional:      Appearance: He is not ill-appearing.  HENT:     Head: Normocephalic and atraumatic.  Eyes:     Conjunctiva/sclera: Conjunctivae normal.  Neck:     Musculoskeletal: Neck supple.  Cardiovascular:     Rate and Rhythm: Normal rate and regular rhythm.     Pulses: Normal pulses.  Pulmonary:     Effort: Pulmonary effort is normal.     Breath sounds: Normal breath sounds. No decreased breath sounds, wheezing, rhonchi or rales.  Chest:     Chest  wall: No tenderness.  Abdominal:     Palpations: Abdomen is soft.     Tenderness: There is abdominal tenderness in the epigastric area. There is no guarding or rebound. Negative signs include Murphy's sign.     Comments: Soft, mild tenderness noted to the epigastrium, +BS throughout, no r/g/r, neg murphy's, neg mcburney's, no CVA TTP  Skin:    General: Skin is warm and dry.  Neurological:     Mental Status: He is alert.      ED Treatments / Results  Labs (all labs ordered are listed, but only abnormal results are displayed) Labs Reviewed  COMPREHENSIVE METABOLIC PANEL - Abnormal; Notable for the following components:      Result Value   Glucose, Bld 106 (*)    ALT 75 (*)    Alkaline Phosphatase 391 (*)    All other components within normal limits  CBC WITH DIFFERENTIAL/PLATELET - Abnormal; Notable for the following components:   WBC 28.1 (*)    RBC 3.90 (*)    Hemoglobin 11.9 (*)    HCT 38.5 (*)    Platelets 592 (*)    Lymphs Abs 20.5 (*)    All other components within normal limits  URINALYSIS, ROUTINE W REFLEX MICROSCOPIC -  Abnormal; Notable for the following components:   Color, Urine AMBER (*)    All other components within normal limits  CULTURE, BLOOD (ROUTINE X 2)  CULTURE, BLOOD (ROUTINE X 2)  SARS CORONAVIRUS 2 (HOSPITAL ORDER, [facility])  LACTIC ACID, PLASMA  APTT  PROTIME-INR  PATHOLOGIST SMEAR REVIEW  TROPONIN I (HIGH SENSITIVITY)  TROPONIN I (HIGH SENSITIVITY)    EKG EKG Interpretation  Date/Time:  Monday May 10 2019 14:51:32 EDT Ventricular Rate:  68 PR Interval:    QRS Duration: 86 QT Interval:  457 QTC Calculation: 487 R Axis:   -18 Text Interpretation:  Sinus rhythm Borderline left axis deviation Non-specific ST-t changes No significant change since last tracing Confirmed by Virgel Manifold [id number]) on 05/10/2019 3:02:15 PM   Radiology Dg Chest 2 View  Result Date: 05/10/2019 CLINICAL DATA:  Acute shortness of breath and chest pain. Dry cough. EXAM: CHEST - 2 VIEW COMPARISON:  02/27/2019 FINDINGS: The cardiomediastinal silhouette is unremarkable. There is no evidence of focal airspace disease, pulmonary edema, suspicious pulmonary nodule/mass, pleural effusion, or pneumothorax. No acute bony abnormalities are identified. IMPRESSION: No active cardiopulmonary disease. Electronically Signed   By: Margarette Canada M.D.   On: 05/10/2019 15:03   Ct Abdomen Pelvis W Contrast  Result Date: 05/10/2019 CLINICAL DATA:  Acute abdominal pain, recent cholecystectomy EXAM: CT ABDOMEN AND PELVIS WITH CONTRAST TECHNIQUE: Multidetector CT imaging of the abdomen and pelvis was performed using the standard protocol following bolus administration of intravenous contrast. CONTRAST:  148m OMNIPAQUE IOHEXOL 300 MG/ML  SOLN COMPARISON:  Hepatobiliary nuclear medicine scan dated 04/22/2019. CT abdomen/pelvis dated 04/21/2019. Trace right pleural effusion. FINDINGS: Lower chest: Trace right pleural effusion. Hepatobiliary: Liver is within normal limits. Status post cholecystectomy.  Progressive curvilinear fluid collection in the gallbladder fossa and along the anterior/superior liver, measuring approximately 2.5 x 2.6 x 6.5 cm (series 2/image 13; coronal image 29). This is progressive from the prior and more discrete/well-defined. Given the absence of visualized bile leak on prior CT, this appearance is considered suspicious for abscess, with biloma considered less likely. No intrahepatic or extrahepatic ductal dilatation. Pancreas: Within normal limits. Spleen: Within normal limits. Adrenals/Urinary Tract: Adrenal glands are within normal limits. Kidneys are within  normal limits.  No hydronephrosis. Bladder is mildly thick-walled although underdistended. Stomach/Bowel: Stomach is within normal limits. No evidence of bowel obstruction. Prior appendectomy. Status post left hemicolectomy with suture line in the lower pelvis (series 2/image 85). Vascular/Lymphatic: No evidence of abdominal aortic aneurysm. Atherosclerotic calcifications of the abdominal aorta and branch vessels. No suspicious abdominopelvic lymphadenopathy. Reproductive: Prostate is unremarkable. Other: No abdominopelvic ascites. Prior lower abdominal ventral hernia. Tiny fat containing right inguinal hernia (series 2/image 92). Musculoskeletal: Mild degenerative changes of the visualized thoracolumbar spine. IMPRESSION: Status post cholecystectomy. 6.5 cm curvilinear fluid collection in the gallbladder fossa, suspicious for abscess, less likely biloma in the setting of prior negative hepatobiliary nuclear medicine scan. Additional ancillary findings as above. Electronically Signed   By: Julian Hy M.D.   On: 05/10/2019 16:07    Procedures Procedures (including critical care time)  Medications Ordered in ED Medications  piperacillin-tazobactam (ZOSYN) IVPB 3.375 g (3.375 g Intravenous New Bag/Given 05/10/19 1738)  iohexol (OMNIPAQUE) 300 MG/ML solution 100 mL (100 mLs Intravenous Contrast Given 05/10/19 1545)      Initial Impression / Assessment and Plan / ED Course  I have reviewed the triage vital signs and the nursing notes.  Pertinent labs & imaging results that were available during my care of the patient were reviewed by me and considered in my medical decision making (see chart for details).    68 year old male who presents to the ED today complaining of continued shortness of breath and intermittent fevers status post cholecystectomy done on 9/15.  Return the next day and was admitted from 9/16 to 9/18.  HIDA scan did not show any bile leak at that time.  On exam today patient temp is 99.  He is not tachycardic nor tachypneic.  No hypotension.  His lungs are clear to auscultation bilaterally.  He is satting 100% on room air despite complaining of shortness of breath.  He does have some tenderness the epigastrium but no peritoneal signs.  Does appear he had a conversation with Dr. Redmond Pulling who did his cholecystectomy on Friday.  There was concern for his shortness of breath and fever at that time and he was advised to come to the ED for further evaluation.  Dr. Redmond Pulling low suspicion for intra-abdominal etiology at that time given patient does have some epigastric tenderness with temp T-max 102.62 days ago will obtain CT A/P.  Also obtain chest x-ray and check baseline blood work today.  Patient does not currently meet sirs criteria.  Antibiotics and IV fluids held off at this time.   Leukocytosis elevated at 28,000 today.  Patient is chronically elevated with his CLL.  At time of discharge on 9/18 his white blood cell count was 19,000.  No electrolyte abnormalities on CMP.  Alk phos is quite elevated at 391 today.  Normal T bili.  Lactic acid normal.   Chest x-ray is clear.  CT scan does show concern for abscess near gallbladder fossa.  Given elevated leukocytosis and this new finding feel patient will need to be admitted at this time.  Will consult general surgery.  Cultures have been collected.   Awaiting further recommendations on antibiotics per general surgery.   Discussed case with Dr. Rosendo Gros - recommends medicine admission and IV abx. Pt may need IR involvement for possible drain. Will consult hospitalist at this time.   Discussed case with hospitalst who agrees to accept patient for admission at this time. Covid swab pending. Pt to be transferred to [facility].   This  note was prepared using Dragon voice recognition software and may include unintentional dictation errors due to the inherent limitations of voice recognition software.        Final Clinical Impressions(s) / ED Diagnoses   Final diagnoses:  Postprocedural intraabdominal abscess  CLL (chronic lymphocytic leukemia) [facility])    ED Discharge Orders    None       Eustaquio Maize, PA-C 05/10/19 1757    Virgel Manifold, MD 05/11/19 [phone]

## 2019-05-10 NOTE — ED Notes (Signed)
Patient denies of pain.  Wife at bedside.

## 2019-05-10 NOTE — ED Triage Notes (Signed)
Pt presents with c/o shortness of. breath and dull chest pain since gall bladder surgery in sept. Pt has dry cough

## 2019-05-10 NOTE — ED Notes (Signed)
Updated on their room assignment.  Patient denies pain and shortness of breath.  Waiting for transport.

## 2019-05-10 NOTE — H&P (Signed)
History and Physical    Jonathon Parker XFG:[id number] DOB: 1951/01/01 DOA: 05/10/2019  PCP: Nicoletta Dress, MD Patient coming from: [facility] ED  Chief Complaint: Shortness of breath  HPI: Ramey Schiff is a 68 y.o. male with medical history significant of CLL currently not on treatment, hypertension, GERD, depression, cholecystitis and recent lap chole on 9/15 presenting to the ED for evaluation of shortness of breath.  Patient reports having shortness of breath since his recent gallbladder surgery.  He is also been having right upper quadrant abdominal pain.  He has not been eating much.  Denies nausea or vomiting.  He started having low-grade fevers a few days ago.  States he coughs when his throat tickles.  Denies chest pain.  ED Course: Intermittently tachypneic.  Not hypoxic.  Not tachycardic.  Afebrile.  White count 28.1, chronically elevated with history of CLL.  Lactic acid normal.  Platelet count 592,000.  ALT 75, alk phos 391.  AST and T bili normal.  LFTs were normal on labs done 2 weeks ago.  UA not suggestive of infection.  High-sensitivity troponin x2 negative.  Blood culture x2 pending.  SARS-CoV-2 test negative.   Chest x-ray showing no active cardiopulmonary disease. CT abdomen pelvis showing a 6.5 cm curvilinear fluid collection in the gallbladder fossa, suspicious for abscess, less likely biloma in the setting of prior negative hepatobiliary nuclear medicine scan. ED provider discussed the case with Dr. Rosendo Gros from general surgery who recommended IV antibiotics and medicine admission.  Patient may need IR involvement for possible drain. Patient received Zosyn.  Review of Systems:  All systems reviewed and apart from history of presenting illness, are negative.  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 Karle Starch in [facility] hospital     Depression    GERD (gastroesophageal reflux disease)     controlled with nexium    Hypertension     Past Surgical History:  Procedure Laterality Date   APPENDECTOMY     CARPAL TUNNEL RELEASE     left    CHOLECYSTECTOMY N/A 04/20/2019   Procedure: LAPAROSCOPIC CHOLECYSTECTOMY;  Surgeon: Greer Pickerel, MD;  Location: [facility];  Service: General;  Laterality: N/A;   COLON SURGERY  2002   10 inches of colon taken out    COLONOSCOPY     ENDOSCOPIC RETROGRADE CHOLANGIOPANCREATOGRAPHY (ERCP) WITH PROPOFOL N/A 03/01/2019   Procedure: ENDOSCOPIC RETROGRADE CHOLANGIOPANCREATOGRAPHY (ERCP) WITH PROPOFOL;  Surgeon: [name], MD;  Location: [place];  Service: Gastroenterology;  Laterality: N/A;   ERCP  03/01/2019   HERNIA REPAIR     bilateral inguinal    IR CHOLANGIOGRAM EXISTING TUBE  03/25/2019   IR PERC CHOLECYSTOSTOMY  03/02/2019   REMOVAL OF STONES  03/01/2019   Procedure: REMOVAL OF STONES;  Surgeon: [name], MD;  Location: [place];  Service: Gastroenterology;;   [name]  03/01/2019   Procedure: [name];  Surgeon: Mansouraty, Telford Nab., MD;  Location: [place];  Service: Gastroenterology;;     reports that he has never smoked. He has never used smokeless tobacco. He reports current alcohol use. He reports that he does not use drugs.  No Known Allergies  Family History  Problem Relation Age of Onset   Colon cancer Neg Hx    Esophageal cancer Neg Hx     Prior to Admission medications   Medication Sig Start Date End Date Taking? Authorizing Provider  buPROPion [facility]  XL) 300 MG 24 hr tablet Take 300 mg by mouth at bedtime.   Yes [provider]  docusate sodium (COLACE) 100 MG capsule Take 1 capsule (100 mg total) by mouth daily as needed for mild constipation. 04/23/19  Yes Oretha Milch D, MD  esomeprazole (NEXIUM) 40 MG capsule Take 40 mg by mouth daily.    Yes [provider]  lisinopril (ZESTRIL) 20 MG tablet Take 20 mg by mouth daily.   Yes  [provider]  traZODone (DESYREL) 100 MG tablet Take 100 mg by mouth at bedtime.   Yes [provider]  acetaminophen (TYLENOL) 325 MG tablet Take 2 tablets (650 mg total) by mouth every 6 (six) hours as needed for mild pain (or temp > 100). 03/07/19   Earnstine Regal, PA-C  ALPRAZolam Duanne Moron) 0.25 MG tablet Take 0.25 mg by mouth at bedtime as needed for anxiety.    [provider]  oxyCODONE (OXY IR/ROXICODONE) 5 MG immediate release tablet Take 1 tablet (5 mg total) by mouth every 6 (six) hours as needed for severe pain. 04/20/19   Greer Pickerel, MD  triamcinolone cream (KENALOG) 0.1 % Apply 1 application topically daily as needed (rash).    [provider]    Physical Exam: Vitals:   05/10/19 2200 05/10/19 2302 05/10/19 2305 05/11/19 0520  BP: (!) 140/91  (!) 138/97 116/66  Pulse: 70  75 67  Resp: (!) '23  16 20  ' Temp:   99.1 F (37.3 C) 99.2 F (37.3 C)  TempSrc:   Oral Oral  SpO2: 96%  98% 99%  Weight:  81 kg    Height:  6' (1.829 m)      Physical Exam  Constitutional: He is oriented to person, place, and time. He appears well-developed and well-nourished. No distress.  HENT:  Head: Normocephalic.  Mouth/Throat: Oropharynx is clear and moist.  Eyes: Right eye exhibits no discharge. Left eye exhibits no discharge.  Neck: Neck supple.  Cardiovascular: Normal rate, regular rhythm and intact distal pulses.  Pulmonary/Chest: Effort normal and breath sounds normal. No respiratory distress. He has no wheezes. He has no rales.  Abdominal: Soft. Bowel sounds are normal. He exhibits no distension. There is abdominal tenderness. There is no rebound and no guarding.  Right upper quadrant tender to palpation  Musculoskeletal:        General: No edema.  Neurological: He is alert and oriented to person, place, and time.  Skin: Skin is warm and dry. He is not diaphoretic.     Labs on Admission: I have personally reviewed following labs and imaging  studies  CBC: Recent Labs  Lab 05/10/19 1224 05/11/19 0352  WBC 28.1* 26.8*  NEUTROABS 6.6  --   HGB 11.9* 11.0*  HCT 38.5* 36.4*  MCV 98.7 101.1*  PLT 592* 573*   Basic Metabolic Panel: Recent Labs  Lab 05/10/19 1224  NA 135  K 3.5  CL 99  CO2 25  GLUCOSE 106*  BUN 14  CREATININE 1.03  CALCIUM 9.1   GFR: Estimated Creatinine Clearance: 75.3 mL/min (by C-G formula based on SCr of 1.03 mg/dL). Liver Function Tests: Recent Labs  Lab 05/10/19 1224 05/11/19 0352  AST 30 19  ALT 75* 55*  ALKPHOS 391* 325*  BILITOT 0.7 0.8  PROT 7.9 6.7  ALBUMIN 3.6 3.0*   No results for input(s): LIPASE, AMYLASE in the last 168 hours. No results for input(s): AMMONIA in the last 168 hours. Coagulation Profile: Recent Labs  Lab  05/10/19 1224  INR 1.0   Cardiac Enzymes: No results for input(s): CKTOTAL, CKMB, CKMBINDEX, TROPONINI in the last 168 hours. BNP (last 3 results) No results for input(s): PROBNP in the last 8760 hours. HbA1C: No results for input(s): HGBA1C in the last 72 hours. CBG: No results for input(s): GLUCAP in the last 168 hours. Lipid Profile: No results for input(s): CHOL, HDL, LDLCALC, TRIG, CHOLHDL, LDLDIRECT in the last 72 hours. Thyroid Function Tests: No results for input(s): TSH, T4TOTAL, FREET4, T3FREE, THYROIDAB in the last 72 hours. Anemia Panel: No results for input(s): VITAMINB12, FOLATE, FERRITIN, TIBC, IRON, RETICCTPCT in the last 72 hours. Urine analysis:    Component Value Date/Time   COLORURINE AMBER (A) 05/10/2019 1518   APPEARANCEUR CLEAR 05/10/2019 1518   LABSPEC 1.020 05/10/2019 1518   PHURINE 6.0 05/10/2019 1518   GLUCOSEU NEGATIVE 05/10/2019 1518   HGBUR NEGATIVE 05/10/2019 1518   BILIRUBINUR NEGATIVE 05/10/2019 1518   KETONESUR NEGATIVE 05/10/2019 1518   PROTEINUR NEGATIVE 05/10/2019 1518   NITRITE NEGATIVE 05/10/2019 1518   LEUKOCYTESUR NEGATIVE 05/10/2019 1518    Radiological Exams on Admission: Dg Chest 2  View  Result Date: 05/10/2019 CLINICAL DATA:  Acute shortness of breath and chest pain. Dry cough. EXAM: CHEST - 2 VIEW COMPARISON:  02/27/2019 FINDINGS: The cardiomediastinal silhouette is unremarkable. There is no evidence of focal airspace disease, pulmonary edema, suspicious pulmonary nodule/mass, pleural effusion, or pneumothorax. No acute bony abnormalities are identified. IMPRESSION: No active cardiopulmonary disease. Electronically Signed   By: Margarette Canada M.D.   On: 05/10/2019 15:03   Ct Abdomen Pelvis W Contrast  Result Date: 05/10/2019 CLINICAL DATA:  Acute abdominal pain, recent cholecystectomy EXAM: CT ABDOMEN AND PELVIS WITH CONTRAST TECHNIQUE: Multidetector CT imaging of the abdomen and pelvis was performed using the standard protocol following bolus administration of intravenous contrast. CONTRAST:  117m OMNIPAQUE IOHEXOL 300 MG/ML  SOLN COMPARISON:  Hepatobiliary nuclear medicine scan dated 04/22/2019. CT abdomen/pelvis dated 04/21/2019. Trace right pleural effusion. FINDINGS: Lower chest: Trace right pleural effusion. Hepatobiliary: Liver is within normal limits. Status post cholecystectomy. Progressive curvilinear fluid collection in the gallbladder fossa and along the anterior/superior liver, measuring approximately 2.5 x 2.6 x 6.5 cm (series 2/image 13; coronal image 29). This is progressive from the prior and more discrete/well-defined. Given the absence of visualized bile leak on prior CT, this appearance is considered suspicious for abscess, with biloma considered less likely. No intrahepatic or extrahepatic ductal dilatation. Pancreas: Within normal limits. Spleen: Within normal limits. Adrenals/Urinary Tract: Adrenal glands are within normal limits. Kidneys are within normal limits.  No hydronephrosis. Bladder is mildly thick-walled although underdistended. Stomach/Bowel: Stomach is within normal limits. No evidence of bowel obstruction. Prior appendectomy. Status post left  hemicolectomy with suture line in the lower pelvis (series 2/image 85). Vascular/Lymphatic: No evidence of abdominal aortic aneurysm. Atherosclerotic calcifications of the abdominal aorta and branch vessels. No suspicious abdominopelvic lymphadenopathy. Reproductive: Prostate is unremarkable. Other: No abdominopelvic ascites. Prior lower abdominal ventral hernia. Tiny fat containing right inguinal hernia (series 2/image 92). Musculoskeletal: Mild degenerative changes of the visualized thoracolumbar spine. IMPRESSION: Status post cholecystectomy. 6.5 cm curvilinear fluid collection in the gallbladder fossa, suspicious for abscess, less likely biloma in the setting of prior negative hepatobiliary nuclear medicine scan. Additional ancillary findings as above. Electronically Signed   By: SJulian HyM.D.   On: 05/10/2019 16:07    EKG: Independently reviewed.  Sinus rhythm, heart rate 68.  Assessment/Plan Principal Problem:   Abnormal findings on diagnostic imaging  of gall bladder Active Problems:   Essential hypertension   Dyspnea   Thrombocytosis (HCC)   Alcohol dependence (HCC)   Gallbladder fossa abscess White count 28.1, chronically elevated with history of CLL.  Afebrile.  Not tachycardic.  Lactic acid normal.  Patient underwent laparoscopic cholecystectomy on 9/15. CT abdomen pelvis showing a 6.5 cm curvilinear fluid collection in the gallbladder fossa, suspicious for abscess.  -General surgery consulted by ED provider.  IR consultation in a.m. for percutaneous drain. -Continue IV Zosyn -IV fluid hydration -Keep n.p.o. -Morphine PRN pain -Blood culture x2 pending  Dyspnea Suspect related to right upper quadrant abdominal pain/discomfort.  Patient is not hypoxic.  No increased work of breathing noted on exam.  Chest x-ray showing no active cardiopulmonary disease.  Lungs clear on exam. -Pain management as mentioned above -Continuous pulse ox  Thrombocytosis Platelet count 592,000,  significantly elevated compared to prior labs.  Repeat CBC showing platelet count 484,000. -Continue to monitor  Hypertension Currently normotensive.  Alcohol dependence Patient consumes alcohol on a daily basis.  No signs of withdrawal at this time. -CIWA protocol; Ativan PRN -Thiamine, folate, multivitamin  DVT prophylaxis: SCDs Code Status: Patient wishes to be full code. Family Communication: No family available. Disposition Plan: Anticipate discharge after clinical improvement. Consults called: General surgery Admission status: It is my clinical opinion that admission to INPATIENT is reasonable and necessary in this 68 y.o. male  presenting with gallbladder fossa abscess after recent cholecystectomy last month.  Needs IV antibiotic and IR consultation in a.m. for percutaneous drain.  Given the aforementioned, the predictability of an adverse outcome is felt to be significant. I expect that the patient will require at least 2 midnights in the hospital to treat this condition.   The medical decision making on this patient was of high complexity and the patient is at high risk for clinical deterioration, therefore this is a level 3 visit.  Shela Leff MD [facility] Pager [phone]  If 7PM-7AM, please contact night-coverage [email or web address] Password [email or web address]  05/11/2019, 7:25 AM

## 2019-05-10 NOTE — Progress Notes (Addendum)
Patient arrived from [place] via [place], and is now in room. Patients wife is aware of admission of patient.  On call physician made aware of patients arrival.  Norlene Duel RN, BSN

## 2019-05-10 NOTE — ED Notes (Addendum)
[facility] notified (Taryn) - patient ready for transport 

## 2019-05-11 ENCOUNTER — Inpatient Hospital Stay (HOSPITAL_COMMUNITY): Payer: Medicare Other

## 2019-05-11 ENCOUNTER — Encounter (HOSPITAL_COMMUNITY): Payer: Self-pay

## 2019-05-11 DIAGNOSIS — F1020 Alcohol dependence, uncomplicated: Secondary | ICD-10-CM

## 2019-05-11 DIAGNOSIS — D75839 Thrombocytosis, unspecified: Secondary | ICD-10-CM

## 2019-05-11 DIAGNOSIS — D473 Essential (hemorrhagic) thrombocythemia: Secondary | ICD-10-CM

## 2019-05-11 DIAGNOSIS — R0600 Dyspnea, unspecified: Secondary | ICD-10-CM

## 2019-05-11 LAB — CBC
HCT: 36.4 % — ABNORMAL LOW (ref 39.0–52.0)
Hemoglobin: 11 g/dL — ABNORMAL LOW (ref 13.0–17.0)
MCH: 30.6 pg (ref 26.0–34.0)
MCHC: 30.2 g/dL (ref 30.0–36.0)
MCV: 101.1 fL — ABNORMAL HIGH (ref 80.0–100.0)
Platelets: 484 10*3/uL — ABNORMAL HIGH (ref 150–400)
RBC: 3.6 MIL/uL — ABNORMAL LOW (ref 4.22–5.81)
RDW: 12.9 % (ref 11.5–15.5)
WBC: 26.8 10*3/uL — ABNORMAL HIGH (ref 4.0–10.5)
nRBC: 0 % (ref 0.0–0.2)

## 2019-05-11 LAB — RENAL FUNCTION PANEL
Albumin: 3.2 g/dL — ABNORMAL LOW (ref 3.5–5.0)
Anion gap: 13 (ref 5–15)
BUN: 13 mg/dL (ref 8–23)
CO2: 25 mmol/L (ref 22–32)
Calcium: 9 mg/dL (ref 8.9–10.3)
Chloride: 101 mmol/L (ref 98–111)
Creatinine, Ser: 1 mg/dL (ref 0.61–1.24)
GFR calc Af Amer: >60 mL/min (ref >60)
GFR calc non Af Amer: >60 mL/min (ref >60)
Glucose, Bld: 93 mg/dL (ref 70–99)
Phosphorus: 3.9 mg/dL (ref 2.5–4.6)
Potassium: 3.7 mmol/L (ref 3.5–5.1)
Sodium: 139 mmol/L (ref 135–145)

## 2019-05-11 LAB — HEPATIC FUNCTION PANEL
ALT: 55 U/L — ABNORMAL HIGH (ref 0–44)
AST: 19 U/L (ref 15–41)
Albumin: 3 g/dL — ABNORMAL LOW (ref 3.5–5.0)
Alkaline Phosphatase: 325 U/L — ABNORMAL HIGH (ref 38–126)
Bilirubin, Direct: 0.2 mg/dL (ref 0.0–0.2)
Indirect Bilirubin: 0.6 mg/dL (ref 0.3–0.9)
Total Bilirubin: 0.8 mg/dL (ref 0.3–1.2)
Total Protein: 6.7 g/dL (ref 6.5–8.1)

## 2019-05-11 MED ORDER — MIDAZOLAM HCL 2 MG/2ML IJ SOLN
INTRAMUSCULAR | Status: AC
  Filled 2019-05-11: qty 4

## 2019-05-11 MED ORDER — HYDRALAZINE HCL 20 MG/ML IJ SOLN: 10.0000 mg | Freq: Three times a day (TID) | INTRAMUSCULAR | Status: DC | PRN

## 2019-05-11 MED ORDER — ALPRAZOLAM 0.25 MG PO TABS: 0.2500 mg | ORAL_TABLET | Freq: Every evening | ORAL | Status: DC | PRN

## 2019-05-11 MED ORDER — FENTANYL CITRATE (PF) 100 MCG/2ML IJ SOLN
INTRAMUSCULAR | Status: AC
  Filled 2019-05-11: qty 4

## 2019-05-11 MED ORDER — FENTANYL CITRATE (PF) 100 MCG/2ML IJ SOLN
INTRAMUSCULAR | Status: AC | PRN
  Administered 2019-05-11 (×2): 50 ug via INTRAVENOUS

## 2019-05-11 MED ORDER — VITAMIN B-1 100 MG PO TABS
100.0000 mg | ORAL_TABLET | Freq: Every day | ORAL | Status: DC
  Administered 2019-05-11 – 2019-05-13 (×3): 100 mg via ORAL
  Filled 2019-05-11 (×3): qty 1

## 2019-05-11 MED ORDER — ACETAMINOPHEN 650 MG RE SUPP: 650.0000 mg | Freq: Four times a day (QID) | RECTAL | Status: DC | PRN

## 2019-05-11 MED ORDER — TRAZODONE HCL 100 MG PO TABS
100.0000 mg | ORAL_TABLET | Freq: Every day | ORAL | Status: DC
  Administered 2019-05-11 – 2019-05-12 (×2): 100 mg via ORAL
  Filled 2019-05-11 (×3): qty 1

## 2019-05-11 MED ORDER — MORPHINE SULFATE (PF) 2 MG/ML IV SOLN
1.0000 mg | INTRAVENOUS | Status: DC | PRN
  Administered 2019-05-11 – 2019-05-12 (×2): 1 mg via INTRAVENOUS
  Filled 2019-05-11: qty 1

## 2019-05-11 MED ORDER — THIAMINE HCL 100 MG/ML IJ SOLN
100.0000 mg | Freq: Every day | INTRAMUSCULAR | Status: DC
  Filled 2019-05-11: qty 2

## 2019-05-11 MED ORDER — ADULT MULTIVITAMIN W/MINERALS CH
1.0000 | ORAL_TABLET | Freq: Every day | ORAL | Status: DC
  Administered 2019-05-11 – 2019-05-12 (×2): 1 via ORAL
  Filled 2019-05-11 (×2): qty 1

## 2019-05-11 MED ORDER — BUPROPION HCL ER (XL) 300 MG PO TB24
300.0000 mg | ORAL_TABLET | Freq: Every day | ORAL | Status: DC
  Administered 2019-05-12 – 2019-05-13 (×2): 300 mg via ORAL
  Filled 2019-05-11 (×2): qty 1

## 2019-05-11 MED ORDER — LIDOCAINE-EPINEPHRINE (PF) 1 %-1:200000 IJ SOLN
INTRAMUSCULAR | Status: AC | PRN
  Administered 2019-05-11: 10 mL

## 2019-05-11 MED ORDER — SODIUM CHLORIDE (PF) 0.9 % IJ SOLN
INTRAMUSCULAR | Status: AC
  Filled 2019-05-11: qty 50

## 2019-05-11 MED ORDER — LORAZEPAM 2 MG/ML IJ SOLN
1.0000 mg | INTRAMUSCULAR | Status: DC | PRN
  Administered 2019-05-11: 03:00:00 1 mg via INTRAVENOUS
  Filled 2019-05-11 (×2): qty 1

## 2019-05-11 MED ORDER — MIDAZOLAM HCL 2 MG/2ML IJ SOLN
INTRAMUSCULAR | Status: AC | PRN
  Administered 2019-05-11 (×4): 1 mg via INTRAVENOUS

## 2019-05-11 MED ORDER — LORAZEPAM 1 MG PO TABS: 1.0000 mg | ORAL_TABLET | ORAL | Status: DC | PRN

## 2019-05-11 MED ORDER — SODIUM CHLORIDE 0.9% FLUSH
5.0000 mL | Freq: Three times a day (TID) | INTRAVENOUS | Status: DC
  Administered 2019-05-11 – 2019-05-13 (×4): 5 mL

## 2019-05-11 MED ORDER — MORPHINE SULFATE (PF) 2 MG/ML IV SOLN
1.0000 mg | INTRAVENOUS | Status: DC | PRN
  Administered 2019-05-11: 1 mg via INTRAVENOUS
  Filled 2019-05-11 (×2): qty 1

## 2019-05-11 MED ORDER — ACETAMINOPHEN 325 MG PO TABS
650.0000 mg | ORAL_TABLET | Freq: Four times a day (QID) | ORAL | Status: DC | PRN
  Administered 2019-05-11: 650 mg via ORAL
  Filled 2019-05-11: qty 2

## 2019-05-11 MED ORDER — FOLIC ACID 1 MG PO TABS
1.0000 mg | ORAL_TABLET | Freq: Every day | ORAL | Status: DC
  Administered 2019-05-11 – 2019-05-12 (×2): 1 mg via ORAL
  Filled 2019-05-11 (×2): qty 1

## 2019-05-11 MED ORDER — SODIUM CHLORIDE 0.9 % IV SOLN: INTRAVENOUS | Status: DC

## 2019-05-11 NOTE — Progress Notes (Signed)
Subjective/Chief Complaint: Pt with no sig changes overnight Con't with some SOB Min abd pain   Objective: Vital signs in last 24 hours: Temp:  [98.9 F (37.2 C)-99.2 F (37.3 C)] 99.2 F (37.3 C) (10/06 0520) Pulse Rate:  [63-98] 67 (10/06 0520) Resp:  [16-24] 20 (10/06 0520) BP: (113-159)/(66-97) 116/66 (10/06 0520) SpO2:  [93 %-100 %] 99 % (10/06 0520) Weight:  [81 kg-85.8 kg] 81 kg (10/05 2302) Last BM Date: 05/09/19  Intake/Output from previous day: 10/05 0701 - 10/06 0700 In: 61.1 [I.V.:2.1; IV Piggyback:59] Out: 0  Intake/Output this shift: No intake/output data recorded.  Constitutional: No acute distress, conversant, appears states age. Eyes: Anicteric sclerae, moist conjunctiva, no lid lag Lungs: Clear to auscultation bilaterally, normal respiratory effort CV: regular rate and rhythm, no murmurs, no peripheral edema, pedal pulses 2+ GI: Soft, no masses or hepatosplenomegaly, non-tender to palpation Skin: No rashes, palpation reveals normal turgor Psychiatric: appropriate judgment and insight, oriented to person, place, and time   Lab Results:  Recent Labs    05/10/19 1224 05/11/19 0352  WBC 28.1* 26.8*  HGB 11.9* 11.0*  HCT 38.5* 36.4*  PLT 592* 484*   BMET Recent Labs    05/10/19 1224  NA 135  K 3.5  CL 99  CO2 25  GLUCOSE 106*  BUN 14  CREATININE 1.03  CALCIUM 9.1   PT/INR Recent Labs    05/10/19 1224  LABPROT 13.3  INR 1.0   ABG No results for input(s): PHART, HCO3 in the last 72 hours.  Invalid input(s): PCO2, PO2  Studies/Results: Dg Chest 2 View  Result Date: 05/10/2019 CLINICAL DATA:  Acute shortness of breath and chest pain. Dry cough. EXAM: CHEST - 2 VIEW COMPARISON:  02/27/2019 FINDINGS: The cardiomediastinal silhouette is unremarkable. There is no evidence of focal airspace disease, pulmonary edema, suspicious pulmonary nodule/mass, pleural effusion, or pneumothorax. No acute bony abnormalities are identified.  IMPRESSION: No active cardiopulmonary disease. Electronically Signed   By: Margarette Canada M.D.   On: 05/10/2019 15:03   Ct Abdomen Pelvis W Contrast  Result Date: 05/10/2019 CLINICAL DATA:  Acute abdominal pain, recent cholecystectomy EXAM: CT ABDOMEN AND PELVIS WITH CONTRAST TECHNIQUE: Multidetector CT imaging of the abdomen and pelvis was performed using the standard protocol following bolus administration of intravenous contrast. CONTRAST:  188mL OMNIPAQUE IOHEXOL 300 MG/ML  SOLN COMPARISON:  Hepatobiliary nuclear medicine scan dated 04/22/2019. CT abdomen/pelvis dated 04/21/2019. Trace right pleural effusion. FINDINGS: Lower chest: Trace right pleural effusion. Hepatobiliary: Liver is within normal limits. Status post cholecystectomy. Progressive curvilinear fluid collection in the gallbladder fossa and along the anterior/superior liver, measuring approximately 2.5 x 2.6 x 6.5 cm (series 2/image 13; coronal image 29). This is progressive from the prior and more discrete/well-defined. Given the absence of visualized bile leak on prior CT, this appearance is considered suspicious for abscess, with biloma considered less likely. No intrahepatic or extrahepatic ductal dilatation. Pancreas: Within normal limits. Spleen: Within normal limits. Adrenals/Urinary Tract: Adrenal glands are within normal limits. Kidneys are within normal limits.  No hydronephrosis. Bladder is mildly thick-walled although underdistended. Stomach/Bowel: Stomach is within normal limits. No evidence of bowel obstruction. Prior appendectomy. Status post left hemicolectomy with suture line in the lower pelvis (series 2/image 85). Vascular/Lymphatic: No evidence of abdominal aortic aneurysm. Atherosclerotic calcifications of the abdominal aorta and branch vessels. No suspicious abdominopelvic lymphadenopathy. Reproductive: Prostate is unremarkable. Other: No abdominopelvic ascites. Prior lower abdominal ventral hernia. Tiny fat containing right  inguinal hernia (series 2/image 92).  Musculoskeletal: Mild degenerative changes of the visualized thoracolumbar spine. IMPRESSION: Status post cholecystectomy. 6.5 cm curvilinear fluid collection in the gallbladder fossa, suspicious for abscess, less likely biloma in the setting of prior negative hepatobiliary nuclear medicine scan. Additional ancillary findings as above. Electronically Signed   By: Julian Hy M.D.   On: 05/10/2019 16:07    Anti-infectives: Anti-infectives (From admission, onward)   Start     Dose/Rate Route Frequency Ordered Stop   05/11/19 0000  piperacillin-tazobactam (ZOSYN) IVPB 3.375 g     3.375 g 12.5 mL/hr over 240 Minutes Intravenous Every 8 hours 05/10/19 2358     05/10/19 1700  piperacillin-tazobactam (ZOSYN) IVPB 3.375 g     3.375 g 100 mL/hr over 30 Minutes Intravenous  Once 05/10/19 1651 05/10/19 1807   05/10/19 1630  cefTRIAXone (ROCEPHIN) 2 g in sodium chloride 0.9 % 100 mL IVPB  Status:  Discontinued     2 g 200 mL/hr over 30 Minutes Intravenous  Once 05/10/19 1622 05/10/19 1624   05/10/19 1630  metroNIDAZOLE (FLAGYL) IVPB 500 mg  Status:  Discontinued     500 mg 100 mL/hr over 60 Minutes Intravenous  Once 05/10/19 1622 05/10/19 1624      Assessment/Plan: 17 M s/p Lap chole approx 2 weeks ago Pt with perihepatic liver abscess.   -Will consult IR to aspirate. -Ok with abx. Will likely need 10-14d outpt -Hopefully home later today vs. tomorrow   LOS: 1 day    Ralene Ok 05/11/2019

## 2019-05-11 NOTE — Progress Notes (Signed)
Pharmacy Antibiotic Note  Jonathon Parker is a 68 y.o. male admitted on 05/10/2019 with Intra-abdominal infection.  Pharmacy has been consulted for zosyn dosing.  Plan: Zosyn 3.375g IV q8h (4 hour infusion).  Height: 6' (182.9 cm) Weight: 178 lb 9.2 oz (81 kg) IBW/kg (Calculated) : 77.6  Temp (24hrs), Avg:99 F (37.2 C), Min:98.9 F (37.2 C), Max:99.1 F (37.3 C)  Recent Labs  Lab 05/10/19 1224 05/10/19 1501 05/11/19 0352  WBC 28.1*  --  26.8*  CREATININE 1.03  --   --   LATICACIDVEN  --  1.0  --     Estimated Creatinine Clearance: 75.3 mL/min (by C-G formula based on SCr of 1.03 mg/dL).    No Known Allergies  Antimicrobials this admission: Zosyn 05/10/2019 >>   Dose adjustments this admission: -  Microbiology results: -  Thank you for allowing pharmacy to be a part of this patient's care.  Jonathon Parker 05/11/2019 4:37 AM

## 2019-05-11 NOTE — Procedures (Signed)
Pre procedural Dx: Post-op fluid colleciton Post procedural Dx: Same  Technically successful [place] and CT guided placed of a 10 Fr drainage catheter placement into the gall bladder fossa yielding 20 cc of purulent, slightly bilious appearing fluid.    All aspirated samples sent to the laboratory for analysis.    EBL: Minimal Complications: None immediate  Ronny Bacon, MD Pager #: [phone]

## 2019-05-11 NOTE — Progress Notes (Signed)
Chief Complaint: Patient was seen in consultation today for image guided aspiration of perihepatic fluid collection at the request of Dr. Rosendo Gros  Referring Physician(s): *Dr. Rosendo Gros  Supervising Physician: Sandi Mariscal  Patient Status: [facility] - In-pt  History of Present Illness: Jonathon Parker is a 68 y.o. male known to IR service with prior perc chole tube placement on 7/28. He subsequently had cholecystectomy on 9/15. He developed post op pain and CT and HIDA scans were performed to eval for biliary leak. HIDA was negative, small amount of perihepatic fluid noted. He has continued to have some discomfort and repeat CT scan yesterday shows the perihepatic fluid collection has grown some and displays some features concerning for abscess. IR is asked to perform image guided aspiration/drainage. PMHx, meds, labs, imaging, allergies reviewed. Feels well, no recent fevers, chills, illness. Has been NPO today as directed. Family at bedside.   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 Karle Starch in [facility]     Depression    GERD (gastroesophageal reflux disease)    controlled with nexium    Hypertension     Past Surgical History:  Procedure Laterality Date   APPENDECTOMY     CARPAL TUNNEL RELEASE     left    CHOLECYSTECTOMY N/A 04/20/2019   Procedure: LAPAROSCOPIC CHOLECYSTECTOMY;  Surgeon: Greer Pickerel, MD;  Location: [facility];  Service: General;  Laterality: N/A;   COLON SURGERY  2002   10 inches of colon taken out    COLONOSCOPY     ENDOSCOPIC RETROGRADE CHOLANGIOPANCREATOGRAPHY (ERCP) WITH PROPOFOL N/A 03/01/2019   Procedure: ENDOSCOPIC RETROGRADE CHOLANGIOPANCREATOGRAPHY (ERCP) WITH PROPOFOL;  Surgeon: [name], MD;  Location: [place];  Service: Gastroenterology;  Laterality: N/A;   ERCP  03/01/2019   HERNIA REPAIR     bilateral inguinal    IR  CHOLANGIOGRAM EXISTING TUBE  03/25/2019   IR PERC CHOLECYSTOSTOMY  03/02/2019   REMOVAL OF STONES  03/01/2019   Procedure: REMOVAL OF STONES;  Surgeon: [name], MD;  Location: [place];  Service: Gastroenterology;;   [name]  03/01/2019   Procedure: [name];  Surgeon: Mansouraty, Telford Nab., MD;  Location: [place];  Service: Gastroenterology;;    Allergies: Patient has no known allergies.  Medications:  Current Facility-Administered Medications:    0.9 %  sodium chloride infusion, , Intravenous, Continuous, Samtani, Jai-Gurmukh, MD   acetaminophen (TYLENOL) tablet 650 mg, 650 mg, Oral, Q6H PRN **OR** acetaminophen (TYLENOL) suppository 650 mg, 650 mg, Rectal, Q6H PRN, Shela Leff, MD   ALPRAZolam Duanne Moron) tablet 0.25 mg, 0.25 mg, Oral, QHS PRN, Verlon Au, Jai-Gurmukh, MD   buPROPion (WELLBUTRIN XL) 24 hr tablet 300 mg, 300 mg, Oral, Daily, Samtani, Jai-Gurmukh, MD   folic acid (FOLVITE) tablet 1 mg, 1 mg, Oral, Daily, Shela Leff, MD, 1 mg at 05/11/19 [id number]   hydrALAZINE (APRESOLINE) injection 10 mg, 10 mg, Intravenous, Q8H PRN, Samtani, Jai-Gurmukh, MD   LORazepam (ATIVAN) tablet 1-4 mg, 1-4 mg, Oral, Q1H PRN **OR** LORazepam (ATIVAN) injection 1-4 mg, 1-4 mg, Intravenous, Q1H PRN, Shela Leff, MD, 1 mg at 05/11/19 0246   morphine 2 MG/ML injection 1 mg, 1 mg, Intravenous, Q4H PRN, Shela Leff, MD   multivitamin with minerals tablet 1 tablet, 1 tablet, Oral, Daily, Shela Leff, MD, 1 tablet at 05/11/19 0952   piperacillin-tazobactam (ZOSYN) IVPB 3.375 g, 3.375 g, Intravenous, Q8H, Shela Leff, MD, Last Rate: 12.5 mL/hr at  05/11/19 0541, 3.375 g at 05/11/19 0541   thiamine (VITAMIN B-1) tablet 100 mg, 100 mg, Oral, Daily, 100 mg at 05/11/19 [id number] **OR** thiamine (B-1) injection 100 mg, 100 mg, Intravenous, Daily, Shela Leff, MD   traZODone (DESYREL) tablet 100 mg, 100 mg, Oral, QHS, Nita Sells, MD    Family History  Problem Relation Age of Onset   Colon cancer Neg Hx    Esophageal cancer Neg Hx     Social History   Socioeconomic History   Marital status: Married    Spouse name: Not on file   Number of children: Not on file   Years of education: Not on file   Highest education level: Not on file  Occupational History   Not on file  Social Needs   Financial resource strain: Not on file   Food insecurity    Worry: Not on file    Inability: Not on file   Transportation needs    Medical: Not on file    Non-medical: Not on file  Tobacco Use   Smoking status: Never Smoker   Smokeless tobacco: Never Used  Substance and Sexual Activity   Alcohol use: Yes    Comment: 1 drink before supper   Drug use: No   Sexual activity: Yes  Lifestyle   Physical activity    Days per week: Not on file    Minutes per session: Not on file   Stress: Not on file  Relationships   Social connections    Talks on phone: Not on file    Gets together: Not on file    Attends religious service: Not on file    Active member of club or organization: Not on file    Attends meetings of clubs or organizations: Not on file    Relationship status: Not on file  Other Topics Concern   Not on file  Social History Narrative   Not on file     Review of Systems: A 12 point ROS discussed and pertinent positives are indicated in the HPI above.  All other systems are negative.  Review of Systems  Vital Signs: BP 116/66 (BP Location: Right Arm)    Pulse 67    Temp 99.2 F (37.3 C) (Oral)    Resp 20    Ht 6' (1.829 m)    Wt 81 kg    SpO2 99%    BMI 24.22 kg/m   Physical Exam Constitutional:      Appearance: Normal appearance.  HENT:     Mouth/Throat:     Mouth: Mucous membranes are moist.     Pharynx: Oropharynx is clear.  Cardiovascular:     Rate and Rhythm: Normal rate and regular rhythm.     Heart sounds: Normal heart sounds.  Pulmonary:     Effort:  Pulmonary effort is normal. No respiratory distress.     Breath sounds: Normal breath sounds.  Abdominal:     General: Abdomen is flat.     Palpations: Abdomen is soft.     Tenderness: There is abdominal tenderness.  Neurological:     General: No focal deficit present.     Mental Status: He is alert and oriented to person, place, and time.     Imaging: Dg Chest 2 View  Result Date: 05/10/2019 CLINICAL DATA:  Acute shortness of breath and chest pain. Dry cough. EXAM: CHEST - 2 VIEW COMPARISON:  02/27/2019 FINDINGS: The cardiomediastinal silhouette is unremarkable. There is no evidence of focal airspace  disease, pulmonary edema, suspicious pulmonary nodule/mass, pleural effusion, or pneumothorax. No acute bony abnormalities are identified. IMPRESSION: No active cardiopulmonary disease. Electronically Signed   By: Margarette Canada M.D.   On: 05/10/2019 15:03   Dg Chest 2 View  Result Date: 04/21/2019 CLINICAL DATA:  Postop fever, cholecystectomy 1 day prior EXAM: CHEST - 2 VIEW COMPARISON:  Radiograph 02/27/2011 FINDINGS: Chronic elevation of the right hemidiaphragm. Streaky opacities in the right lung base most suggestive of postoperative atelectasis. No consolidation, features of edema, pneumothorax, or effusion. Pulmonary vascularity is normally distributed. The cardiomediastinal contours are unremarkable. No acute osseous or soft tissue abnormality. No convincing evidence of free subdiaphragmatic air. IMPRESSION: Streaky opacities in the right lung base most suggestive of postoperative atelectasis. No other acute cardiopulmonary disease. Electronically Signed   By: Lovena Le M.D.   On: 04/21/2019 20:11   Ct Angio Chest Pe W And/or Wo Contrast  Result Date: 04/21/2019 CLINICAL DATA:  68 year old male with lower chest pain, upper abdominal pain. Postoperative day 1 cholecystectomy. Chronic lymphocytic leukemia. EXAM: CT ANGIOGRAPHY CHEST WITH CONTRAST TECHNIQUE: Multidetector CT imaging of the  chest was performed using the standard protocol during bolus administration of intravenous contrast. Multiplanar CT image reconstructions and MIPs were obtained to evaluate the vascular anatomy. CONTRAST:  118mL OMNIPAQUE IOHEXOL 350 MG/ML SOLN COMPARISON:  CT Abdomen and Pelvis today are reported separately. Chest radiographs earlier today. FINDINGS: Cardiovascular: Good contrast bolus timing in the pulmonary arterial tree. Respiratory motion. No convincing No focal filling defect identified in the pulmonary arteries to suggest acute pulmonary embolism. Negative visible aorta. Cardiac size within effusion normal limits. No pericardial. Mediastinum/Nodes: Negative. Lungs/Pleura: Elevated right hemidiaphragm. Small to moderate layering right pleural effusion. Adjacent enhancing right lung atelectasis. Atelectatic changes to the major airways which otherwise appear patent. Mild enhancing atelectasis in the dependent left lower lobe. No other pulmonary process. Upper Abdomen: Reported separately today. Musculoskeletal: No acute osseous abnormality identified. Review of the MIP images confirms the above findings. IMPRESSION: 1. Respiratory motion but good contrast bolus timing with no convincing pulmonary embolus. 2. Elevated right hemidiaphragm with small to moderate layering right pleural effusion and right greater than left lung atelectasis. 3. See also CT Abdomen and Pelvis reported separately. Electronically Signed   By: Genevie Ann M.D.   On: 04/21/2019 21:34   Ct Abdomen Pelvis W Contrast  Result Date: 05/10/2019 CLINICAL DATA:  Acute abdominal pain, recent cholecystectomy EXAM: CT ABDOMEN AND PELVIS WITH CONTRAST TECHNIQUE: Multidetector CT imaging of the abdomen and pelvis was performed using the standard protocol following bolus administration of intravenous contrast. CONTRAST:  148mL OMNIPAQUE IOHEXOL 300 MG/ML  SOLN COMPARISON:  Hepatobiliary nuclear medicine scan dated 04/22/2019. CT abdomen/pelvis dated  04/21/2019. Trace right pleural effusion. FINDINGS: Lower chest: Trace right pleural effusion. Hepatobiliary: Liver is within normal limits. Status post cholecystectomy. Progressive curvilinear fluid collection in the gallbladder fossa and along the anterior/superior liver, measuring approximately 2.5 x 2.6 x 6.5 cm (series 2/image 13; coronal image 29). This is progressive from the prior and more discrete/well-defined. Given the absence of visualized bile leak on prior CT, this appearance is considered suspicious for abscess, with biloma considered less likely. No intrahepatic or extrahepatic ductal dilatation. Pancreas: Within normal limits. Spleen: Within normal limits. Adrenals/Urinary Tract: Adrenal glands are within normal limits. Kidneys are within normal limits.  No hydronephrosis. Bladder is mildly thick-walled although underdistended. Stomach/Bowel: Stomach is within normal limits. No evidence of bowel obstruction. Prior appendectomy. Status post left hemicolectomy with suture line  in the lower pelvis (series 2/image 85). Vascular/Lymphatic: No evidence of abdominal aortic aneurysm. Atherosclerotic calcifications of the abdominal aorta and branch vessels. No suspicious abdominopelvic lymphadenopathy. Reproductive: Prostate is unremarkable. Other: No abdominopelvic ascites. Prior lower abdominal ventral hernia. Tiny fat containing right inguinal hernia (series 2/image 92). Musculoskeletal: Mild degenerative changes of the visualized thoracolumbar spine. IMPRESSION: Status post cholecystectomy. 6.5 cm curvilinear fluid collection in the gallbladder fossa, suspicious for abscess, less likely biloma in the setting of prior negative hepatobiliary nuclear medicine scan. Additional ancillary findings as above. Electronically Signed   By: Julian Hy M.D.   On: 05/10/2019 16:07   Ct Abdomen Pelvis W Contrast  Result Date: 04/21/2019 CLINICAL DATA:  68 year old male with lower chest pain, upper abdominal  pain. Postoperative day 1 cholecystectomy. Chronic lymphocytic leukemia. EXAM: CT ABDOMEN AND PELVIS WITH CONTRAST TECHNIQUE: Multidetector CT imaging of the abdomen and pelvis was performed using the standard protocol following bolus administration of intravenous contrast. CONTRAST:  138mL OMNIPAQUE IOHEXOL 350 MG/ML SOLN COMPARISON:  CTA chest today reported separately. CT Abdomen and Pelvis 01/16/2017. FINDINGS: Lower chest: Reported separately today. Elevation of the right hemidiaphragm is increased since 2018. Hepatobiliary: Ventral abdominal wall incision with no adverse features. Trace gas in the left rectus muscle. Small volume perihepatic fluid with simple fluid density. Regional mild inflammatory stranding. Surgical clips in the gallbladder fossa where a small volume of fluid and trace gas is present. Mild pneumobilia, mostly non dependent in the left lobe. More moderate volume gas in the CBD. The CBD measures 8-9 millimeters, with no stone or other filling defect identified along its course. Pancreas: Pancreas remains within normal limits. Spleen: Negative. Adrenals/Urinary Tract: Normal adrenal glands. Bilateral renal enhancement and contrast excretion is symmetric and normal. Normal proximal ureters. No nephrolithiasis. Decompressed ureters. Unremarkable urinary bladder. Stomach/Bowel: Chronic rectosigmoid anastomosis with a nearby surgical clip is stable since 2018 with no adverse features. Mildly redundant but otherwise negative sigmoid. Negative descending colon. Redundant splenic flexure. The hepatic flexure remains within normal limits despite some regional inflammation. Negative right colon. Prior appendectomy, stable. Negative terminal ileum. No dilated small bowel. Mostly decompressed stomach and duodenum. There is a small volume of fluid in the gastrohepatic ligament and adjacent to the distal esophagus on series 4, image 15. There is evidence of a 2 centimeter duodenal diverticulum on coronal  image 39. Trace pneumoperitoneum including between the left lobe of the liver and the diaphragm. Vascular/Lymphatic: Mild Aortoiliac calcified atherosclerosis. Major arterial structures are patent. Portal venous system is patent. Reproductive: Lower inguinal/space of Retzius prior hernia repair with mesh is stable since 2018. Otherwise negative. Other: Small volume free fluid in the right hemipelvis with slightly complex fluid density. There is also an associated small layering 4 millimeter calculus in the right hemipelvis on series 4, image 86. Musculoskeletal: Lower lumbar spine degeneration. No acute osseous abnormality identified. IMPRESSION: 1. Recent cholecystectomy with satisfactory appearance of the surgical bed. Small volume perihepatic fluid.  Trace pneumoperitoneum. Pneumobilia, with 8-9 mm diameter CBD, but no other CBD filling defect. Small volume of mildly complex fluid layering in the pelvis along with a 4 mm calculus which might be a dropped gallstone (series 4, image 86). Ventral abdominal incision with no adverse features. 2. No other acute or inflammatory process in the abdomen or pelvis. 3. Lower chest findings reported with chest CTA today separately. Electronically Signed   By: Genevie Ann M.D.   On: 04/21/2019 21:44   Nm Hepato Biliary Leak  Result Date: 04/22/2019  CLINICAL DATA:  One day postop from cholecystectomy. Severe upper abdominal pain and nausea. Evaluate for postop bile leak. EXAM: NUCLEAR MEDICINE HEPATOBILIARY IMAGING TECHNIQUE: Sequential images of the abdomen were obtained out to 60 minutes following intravenous administration of radiopharmaceutical. RADIOPHARMACEUTICALS:  5.5 mCi Tc-50m  Choletec IV COMPARISON:  None. FINDINGS: Prompt uptake and biliary excretion of activity by the liver is seen. No gallbladder activity is seen, consistent with prior cholecystectomy. Biliary activity passes into small bowel, consistent with patent common bile duct. No leak of biliary activity  is seen. Reflux of biliary activity into the stomach is noted. IMPRESSION: Status post cholecystectomy. No evidence of postop bile leak or biliary obstruction. Bile reflux noted. Electronically Signed   By: Marlaine Hind M.D.   On: 04/22/2019 14:54    Labs:  CBC: Recent Labs    04/22/19 0251 04/23/19 0253 05/10/19 1224 05/11/19 0352  WBC 23.8* 19.0* 28.1* 26.8*  HGB 11.9* 10.3* 11.9* 11.0*  HCT 37.4* 32.5* 38.5* 36.4*  PLT 141* 140* 592* 484*    COAGS: Recent Labs    03/01/19 0254 03/01/19 0959 05/10/19 1224  INR 1.1  --  1.0  APTT  --  35 33    BMP: Recent Labs    04/22/19 0251 04/22/19 1658 05/10/19 1224 05/11/19 0352  NA 141 140 135 139  K 4.1 4.1 3.5 3.7  CL 109 102 99 101  CO2 26 28 25 25   GLUCOSE 109* 109* 106* 93  BUN 14 12 14 13   CALCIUM 8.4* 8.9 9.1 9.0  CREATININE 1.08 0.98 1.03 1.00  GFRNONAA >60 >60 >60 >60  GFRAA >60 >60 >60 >60    LIVER FUNCTION TESTS: Recent Labs    04/21/19 1942 04/22/19 0251 05/10/19 1224 05/11/19 0352  BILITOT 1.2 1.8* 0.7 0.8  AST 31 23 30 19   ALT 31 26 75* 55*  ALKPHOS 68 58 391* 325*  PROT 6.4* 5.5* 7.9 6.7  ALBUMIN 3.8 3.1* 3.6 3.2*   3.0*    TUMOR MARKERS: No results for input(s): AFPTM, CEA, CA199, CHROMGRNA in the last 8760 hours.  Assessment and Plan: Perihepatic fluid collection/abscess post cholecystectomy on 9/15. Plan for image guided aspiration/drainage of this fluid collection today Labs ok Risks and benefits discussed with the patient including bleeding, infection, damage to adjacent structures, bowel perforation/fistula connection, and sepsis.  All of the patient's questions were answered, patient is agreeable to proceed. Consent signed and in chart.    Thank you for this interesting consult.  I greatly enjoyed meeting [facility] and look forward to participating in their care.  A copy of this report was sent to the requesting provider on this date.  Electronically Signed: Ascencion Dike, PA-C 05/11/2019, 1:41 PM   I spent a total of 20 minutes in face to face in clinical consultation, greater than 50% of which was counseling/coordinating care for abscess aspiration

## 2019-05-11 NOTE — Progress Notes (Signed)
Hospitalist progress note  Jonathon Parker  [name] DOB: [birth year] DOA: 05/10/2019 PCP: Nicoletta Dress, MD  Narrative:  22 M lap chole CLL (not on Rx under surveillance), HTN, reflux, depression hemangioma never, seen 01/23/2017 prior?  Colonic polyp status post sigmoid colectomy 2002--initially admitted abdominal pain prior perc cholecystotomy 7/26-8/2-Underwent ERCP sphincterotomy and balloon sweep + drainage-tube exchange 8/20 found to have gallstones + cystic duct at follow-up  Electively readmitted-underwent lap chole 04/20/2019 by Dr. Redmond Pulling general surgery-DC home on readmit 9/16 2/2 acute abdominal pain, fever, SOB--follow-up?  Postop atelectasis-DC + cefdinir 9/18- Return to [facility] ED 10/5+ S OB + RUQ pain + anorexia + low-grade fever despite using cefdinir CT abdomen pelvis = 6.5 curvilinear collection gallbladder fossa?  Abscess-General surgery Dr. Rosendo Gros consulted-IR consulted at his request  Assessment & Plan:   Gallbladder fossa abscess 6.5 cm, transaminitis predominant alk phos elevation-patient n.p.o. for procedure pain is moderately controlled-await drain placement-?  Duration placement, recovery time - deferred these answers to procedure specialists-continue Zosyn-obtain cultures of abscess materia to help narrow antibiotics-continue morphine 1 mg every 4 as needed severe pain, Tylenol moderate pain-continue saline at 125 cc/h CLL with leukocytosis-has had exuberant leukocytosis since having issues with gallbladder 02/27/2019 onwards-May need outpatient follow-up of these issues if it does not resolve post abscess drainage with oncology Liver hemangioma-May need imaging in outpatient setting Colonic polyp status post sigmoid colectomy 2002-outpatient screening HTN-holding lisinopril 20 at this time-placed on hydralazine as PRN IV dosages Depression continue trazodone 100 at bedtime, Wellbutrin 300 daily, Xanax 0.25 at bedtime as needed when able to take p.o.  DVT prophylaxis:  Lovenox code Status:   Full   family Communication:   None presently at bedside disposition Plan: Inpatient   Consultants:   General surgery  Procedures:   Planned to have IR placement of drain  Antimicrobials:   Zosyn  Subjective: Awake alert but in some moderate amount of pain Mild flatus but no stool for the past several days as not eating Painful to sit up No nausea no vomiting no high-grade fever but has had low-grade temperatures overnight  Objective: Vitals:   05/10/19 2200 05/10/19 2302 05/10/19 2305 05/11/19 0520  BP: (!) 140/91  (!) 138/97 116/66  Pulse: 70  75 67  Resp: (!) '23  16 20  ' Temp:   99.1 F (37.3 C) 99.2 F (37.3 C)  TempSrc:   Oral Oral  SpO2: 96%  98% 99%  Weight:  81 kg    Height:  6' (1.829 m)      Intake/Output Summary (Last 24 hours) at 05/11/2019 0802 Last data filed at 05/11/2019 0520 Gross per 24 hour  Intake 61.09 ml  Output 0 ml  Net 61.09 ml   Filed Weights   05/10/19 1501 05/10/19 2302  Weight: 85.8 kg 81 kg    Examination: EOMI NCAT looking younger than stated age no icterus no pallor Throat soft supple without any submandibular lymphadenopathy or thyromegaly Chest clear no added sound without rales or rhonchi Abdomen soft nontender no rebound no guarding No lower extremity edema ROM intact bilaterally Postop scars to abdomen well-healed and lower quadrant No rebound no guarding although slightly tender in the right upper quadrant   Data Reviewed: I have personally reviewed following labs and imaging studies  CBC: Recent Labs  Lab 05/10/19 1224 05/11/19 0352  WBC 28.1* 26.8*  NEUTROABS 6.6  --   HGB 11.9* 11.0*  HCT 38.5* 36.4*  MCV 98.7 101.1*  PLT 592* 484*  Basic Metabolic Panel: Recent Labs  Lab 05/10/19 1224  NA 135  K 3.5  CL 99  CO2 25  GLUCOSE 106*  BUN 14  CREATININE 1.03  CALCIUM 9.1   GFR: Estimated Creatinine Clearance: 75.3 mL/min (by C-G formula based on SCr of 1.03 mg/dL). Liver  Function Tests: Recent Labs  Lab 05/10/19 1224 05/11/19 0352  AST 30 19  ALT 75* 55*  ALKPHOS 391* 325*  BILITOT 0.7 0.8  PROT 7.9 6.7  ALBUMIN 3.6 3.0*   No results for input(s): LIPASE, AMYLASE in the last 168 hours. No results for input(s): AMMONIA in the last 168 hours. Coagulation Profile: Recent Labs  Lab 05/10/19 1224  INR 1.0   Cardiac Enzymes:  Radiology Studies: Reviewed images personally in health database   Scheduled Meds: . folic acid  1 mg Oral Daily  . multivitamin with minerals  1 tablet Oral Daily  . thiamine  100 mg Oral Daily   Or  . thiamine  100 mg Intravenous Daily   Continuous Infusions: . sodium chloride 125 mL/hr at 05/11/19 0038  . piperacillin-tazobactam (ZOSYN)  IV 3.375 g (05/11/19 0541)     LOS: 1 day   Time spent: John Day, MD [facility]  If 7PM-7AM, please contact night-coverage-look on AMION to find my number otherwise-prefer pages-not epic chat,please 05/11/2019, 8:02 AM

## 2019-05-12 ENCOUNTER — Encounter (HOSPITAL_COMMUNITY): Payer: Self-pay | Admitting: *Deleted

## 2019-05-12 LAB — COMPREHENSIVE METABOLIC PANEL
ALT: 38 U/L (ref 0–44)
AST: 16 U/L (ref 15–41)
Albumin: 3 g/dL — ABNORMAL LOW (ref 3.5–5.0)
Alkaline Phosphatase: 267 U/L — ABNORMAL HIGH (ref 38–126)
Anion gap: 5 (ref 5–15)
BUN: 13 mg/dL (ref 8–23)
CO2: 30 mmol/L (ref 22–32)
Calcium: 8.7 mg/dL — ABNORMAL LOW (ref 8.9–10.3)
Chloride: 101 mmol/L (ref 98–111)
Creatinine, Ser: 0.91 mg/dL (ref 0.61–1.24)
GFR calc Af Amer: >60 mL/min (ref >60)
GFR calc non Af Amer: >60 mL/min (ref >60)
Glucose, Bld: 100 mg/dL — ABNORMAL HIGH (ref 70–99)
Potassium: 3.6 mmol/L (ref 3.5–5.1)
Sodium: 136 mmol/L (ref 135–145)
Total Bilirubin: 0.5 mg/dL (ref 0.3–1.2)
Total Protein: 6.6 g/dL (ref 6.5–8.1)

## 2019-05-12 LAB — CBC WITH DIFFERENTIAL/PLATELET
Abs Immature Granulocytes: 0.04 10*3/uL (ref 0.00–0.07)
Basophils Absolute: 0.1 10*3/uL (ref 0.0–0.1)
Basophils Relative: 0 %
Eosinophils Absolute: 0.1 10*3/uL (ref 0.0–0.5)
Eosinophils Relative: 1 %
HCT: 36.3 % — ABNORMAL LOW (ref 39.0–52.0)
Hemoglobin: 11.4 g/dL — ABNORMAL LOW (ref 13.0–17.0)
Immature Granulocytes: 0 %
Lymphocytes Relative: 79 %
Lymphs Abs: 18.8 10*3/uL — ABNORMAL HIGH (ref 0.7–4.0)
MCH: 30.7 pg (ref 26.0–34.0)
MCHC: 31.4 g/dL (ref 30.0–36.0)
MCV: 97.8 fL (ref 80.0–100.0)
Monocytes Absolute: 0.5 10*3/uL (ref 0.1–1.0)
Monocytes Relative: 2 %
Neutro Abs: 4.3 10*3/uL (ref 1.7–7.7)
Neutrophils Relative %: 18 %
Platelets: 434 10*3/uL — ABNORMAL HIGH (ref 150–400)
RBC: 3.71 MIL/uL — ABNORMAL LOW (ref 4.22–5.81)
RDW: 12.9 % (ref 11.5–15.5)
WBC: 23.8 10*3/uL — ABNORMAL HIGH (ref 4.0–10.5)
nRBC: 0 % (ref 0.0–0.2)

## 2019-05-12 LAB — PATHOLOGIST SMEAR REVIEW

## 2019-05-12 MED ORDER — AMOXICILLIN-POT CLAVULANATE 875-125 MG PO TABS
1.0000 | ORAL_TABLET | Freq: Two times a day (BID) | ORAL | Status: DC
  Administered 2019-05-12 – 2019-05-13 (×2): 1 via ORAL
  Filled 2019-05-12 (×2): qty 1

## 2019-05-12 MED ORDER — OXYCODONE-ACETAMINOPHEN 5-325 MG PO TABS
2.0000 | ORAL_TABLET | ORAL | Status: DC
  Administered 2019-05-12 – 2019-05-13 (×7): 2 via ORAL
  Filled 2019-05-12 (×7): qty 2

## 2019-05-12 MED ORDER — ENSURE ENLIVE PO LIQD: 237.0000 mL | Freq: Two times a day (BID) | ORAL | Status: DC

## 2019-05-12 NOTE — Progress Notes (Signed)
Subjective/Chief Complaint: Pt reports soreness today tol PO   Objective: Vital signs in last 24 hours: Temp:  [98.5 F (36.9 C)-98.9 F (37.2 C)] 98.5 F (36.9 C) (10/07 0525) Pulse Rate:  [62-89] 70 (10/07 0525) Resp:  [16-19] 17 (10/07 0525) BP: (104-154)/(62-90) 121/78 (10/07 0525) SpO2:  [95 %-100 %] 95 % (10/07 0525) Last BM Date: 05/09/19  Intake/Output from previous day: 10/06 0701 - 10/07 0700 In: 940 [P.O.:240; I.V.:500; IV Piggyback:190] Out: 985 [Urine:950; Drains:35] Intake/Output this shift: No intake/output data recorded.  Constitutional: No acute distress, conversant, appears states age. Eyes: Anicteric sclerae, moist conjunctiva, no lid lag Lungs: Clear to auscultation bilaterally, normal respiratory effort CV: regular rate and rhythm, no murmurs, no peripheral edema, pedal pulses 2+ GI: Soft, no masses or hepatosplenomegaly, non-tender to palpation, Jp in place with SS/bilious output Skin: No rashes, palpation reveals normal turgor Psychiatric: appropriate judgment and insight, oriented to person, place, and time  Lab Results:  Recent Labs    05/11/19 0352 05/12/19 0812  WBC 26.8* 23.8*  HGB 11.0* 11.4*  HCT 36.4* 36.3*  PLT 484* 434*   BMET Recent Labs    05/11/19 0352 05/12/19 0812  NA 139 136  K 3.7 3.6  CL 101 101  CO2 25 30  GLUCOSE 93 100*  BUN 13 13  CREATININE 1.00 0.91  CALCIUM 9.0 8.7*   PT/INR Recent Labs    05/10/19 1224  LABPROT 13.3  INR 1.0   ABG No results for input(s): PHART, HCO3 in the last 72 hours.  Invalid input(s): PCO2, PO2  Studies/Results: Dg Chest 2 View  Result Date: 05/10/2019 CLINICAL DATA:  Acute shortness of breath and chest pain. Dry cough. EXAM: CHEST - 2 VIEW COMPARISON:  02/27/2019 FINDINGS: The cardiomediastinal silhouette is unremarkable. There is no evidence of focal airspace disease, pulmonary edema, suspicious pulmonary nodule/mass, pleural effusion, or pneumothorax. No acute bony  abnormalities are identified. IMPRESSION: No active cardiopulmonary disease. Electronically Signed   By: Margarette Canada M.D.   On: 05/10/2019 15:03   Ct Abdomen Pelvis W Contrast  Result Date: 05/10/2019 CLINICAL DATA:  Acute abdominal pain, recent cholecystectomy EXAM: CT ABDOMEN AND PELVIS WITH CONTRAST TECHNIQUE: Multidetector CT imaging of the abdomen and pelvis was performed using the standard protocol following bolus administration of intravenous contrast. CONTRAST:  14mL OMNIPAQUE IOHEXOL 300 MG/ML  SOLN COMPARISON:  Hepatobiliary nuclear medicine scan dated 04/22/2019. CT abdomen/pelvis dated 04/21/2019. Trace right pleural effusion. FINDINGS: Lower chest: Trace right pleural effusion. Hepatobiliary: Liver is within normal limits. Status post cholecystectomy. Progressive curvilinear fluid collection in the gallbladder fossa and along the anterior/superior liver, measuring approximately 2.5 x 2.6 x 6.5 cm (series 2/image 13; coronal image 29). This is progressive from the prior and more discrete/well-defined. Given the absence of visualized bile leak on prior CT, this appearance is considered suspicious for abscess, with biloma considered less likely. No intrahepatic or extrahepatic ductal dilatation. Pancreas: Within normal limits. Spleen: Within normal limits. Adrenals/Urinary Tract: Adrenal glands are within normal limits. Kidneys are within normal limits.  No hydronephrosis. Bladder is mildly thick-walled although underdistended. Stomach/Bowel: Stomach is within normal limits. No evidence of bowel obstruction. Prior appendectomy. Status post left hemicolectomy with suture line in the lower pelvis (series 2/image 85). Vascular/Lymphatic: No evidence of abdominal aortic aneurysm. Atherosclerotic calcifications of the abdominal aorta and branch vessels. No suspicious abdominopelvic lymphadenopathy. Reproductive: Prostate is unremarkable. Other: No abdominopelvic ascites. Prior lower abdominal ventral  hernia. Tiny fat containing right inguinal hernia (series 2/image  92). Musculoskeletal: Mild degenerative changes of the visualized thoracolumbar spine. IMPRESSION: Status post cholecystectomy. 6.5 cm curvilinear fluid collection in the gallbladder fossa, suspicious for abscess, less likely biloma in the setting of prior negative hepatobiliary nuclear medicine scan. Additional ancillary findings as above. Electronically Signed   By: Julian Hy M.D.   On: 05/10/2019 16:07   Ct Image Guided Drainage By Percutaneous Catheter  Result Date: 05/11/2019 INDICATION: History of laparoscopic cholecystectomy, now with gallbladder fossa fluid collection extending to the perihepatic space worrisome for infection. Please perform image guided drainage catheter placement for infection source control purposes. EXAM: CT AND ULTRASOUND GUIDED DRAINAGE BY PERCUTANEOUS CATHETER COMPARISON:  CT abdomen and pelvis - 05/10/2019 MEDICATIONS: The patient is currently admitted to the hospital and receiving intravenous antibiotics. The antibiotics were administered within an appropriate time frame prior to the initiation of the procedure. ANESTHESIA/SEDATION: Moderate (conscious) sedation was employed during this procedure. A total of Versed 4 mg and Fentanyl 100 mcg was administered intravenously. Moderate Sedation Time: 20 minutes. The patient's level of consciousness and vital signs were monitored continuously by radiology nursing throughout the procedure under my direct supervision. CONTRAST:  None COMPLICATIONS: None immediate. PROCEDURE: Informed written consent was obtained from the patient after a discussion of the risks, benefits and alternatives to treatment. The patient was placed supine on the CT gantry and a pre procedural CT was performed re-demonstrating the known abscess/fluid collection within the gallbladder fossa in perihepatic space with dominant component within the gallbladder fossa measuring approximately 3.2  x 3.0 cm (image 31, series 2). The procedure was planned. A timeout was performed prior to the initiation of the procedure. The skin overlying the right upper abdomen was prepped and draped in the usual sterile fashion. The overlying soft tissues were anesthetized with 1% lidocaine with epinephrine. Under direct ultrasound guidance, the dominant component about the perihepatic space was targeted with an 18 gauge trocar needle and a short Amplatz wire was coiled within the collection. CT imaging was performed however it was felt as the source of the residual fluid was likely from the gallbladder fossa the initial attempted access site was a bandage. Again, under direct ultrasound guidance, the dominant component within the gallbladder fossa was targeted with an 18 gauge trocar needle and a short Amplatz wire was coiled within collection. CT imaging was performed demonstrating appropriate position with the Amplatz wire coiled within the gallbladder fossa. The tract was serially dilated allowing placement of a 10 [place] all-purpose drainage catheter. Appropriate positioning was confirmed with a limited postprocedural CT scan. 20 cc of purulent, slightly bilious appearing fluid was aspirated. The tube was connected to a JP bulb and sutured in place. A dressing was placed. The patient tolerated the procedure well without immediate post procedural complication. IMPRESSION: Successful CT guided placement of a 10 French all purpose drain catheter into the gallbladder fossa with aspiration of 20 cc of purulent, slightly bilious appearing fluid. Samples were sent to the laboratory as requested by the ordering clinical team. Electronically Signed   By: Sandi Mariscal M.D.   On: 05/11/2019 18:35    Anti-infectives: Anti-infectives (From admission, onward)   Start     Dose/Rate Route Frequency Ordered Stop   05/11/19 0000  piperacillin-tazobactam (ZOSYN) IVPB 3.375 g     3.375 g 12.5 mL/hr over 240 Minutes Intravenous Every  8 hours 05/10/19 2358     05/10/19 1700  piperacillin-tazobactam (ZOSYN) IVPB 3.375 g     3.375 g 100 mL/hr over 30 Minutes  Intravenous  Once 05/10/19 1651 05/10/19 1807   05/10/19 1630  cefTRIAXone (ROCEPHIN) 2 g in sodium chloride 0.9 % 100 mL IVPB  Status:  Discontinued     2 g 200 mL/hr over 30 Minutes Intravenous  Once 05/10/19 1622 05/10/19 1624   05/10/19 1630  metroNIDAZOLE (FLAGYL) IVPB 500 mg  Status:  Discontinued     500 mg 100 mL/hr over 60 Minutes Intravenous  Once 05/10/19 1622 05/10/19 1624      Assessment/Plan: 33 M s/p Lap chole approx 2 weeks ago Pt with perihepatic liver abscess.   -Adv diet as tol -Abx will likely need 10-14d outpt -OK for home today and can f/u with Dr. Redmond Pulling and IR drain clinic  LOS: 2 days    Ralene Ok 05/12/2019

## 2019-05-12 NOTE — Progress Notes (Signed)
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  Colonic polyp status post sigmoid colectomy 2002--initially admitted abdominal pain prior perc cholecystotomy 7/26-8/2-Underwent ERCP sphincterotomy and balloon sweep + drainage-tube exchange 8/20 found to have gallstones + cystic duct at follow-up  Electively readmitted-underwent lap chole 04/20/2019 by Dr. Redmond Pulling general surgery-DC home on readmit 9/16 2/2 acute abdominal pain, fever, SOB--follow-up?  Postop atelectasis-DC + cefdinir 9/18- Return to [facility] ED 10/5+ S OB + RUQ pain + anorexia + low-grade fever despite using cefdinir CT abdomen pelvis = 6.5 curvilinear collection gallbladder fossa?  Abscess-General surgery Dr. Rosendo Gros consulted-IR consulted at his request  Assessment & Plan:   Gallbladder fossa abscess 6.5 cm, transaminitis predominant alk phos elevation--continue Zosyn- await culture data-pain is not entirely well controlled and he has pain at the site of drain and with movement and it is 7/10-we will transition today to p.o. Percocet 12/06/2023 X2 iv x/day, can continue morphine 1 mg every 4 as needed severe pain and monitor his trends, Tylenol moderate pain-continue saline at 125 cc/h  CLL with leukocytosis-white count now down from 26-23--monitor trends in setting of infection  Liver hemangioma-May need imaging in outpatient setting Colonic polyp status post sigmoid colectomy 2002-outpatient screening  HTN-holding lisinopril 20 at this time-placed on hydralazine as PRN IV dosages  Depression continue trazodone 100 at bedtime, Wellbutrin 300 daily, Xanax 0.25 at bedtime as needed when able to take p.o.  DVT prophylaxis: Lovenox code Status:   Full   family Communication:   None presently at bedside disposition Plan:  Inpatient   Consultants:   General surgery  Procedures:   Planned to have IR placement of drain  Antimicrobials:   Zosyn  Subjective: Does not like the food hardly ate a meal at the bedside No chest pain Passing flatus but no stool Tolerated a fig newton bar and slice of cake last night think that he can tolerate oral meds Objective: Vitals:   05/11/19 1635 05/11/19 1640 05/11/19 2113 05/12/19 0525  BP: 120/62 130/72 (!) 154/90 121/78  Pulse: 62 66 89 70  Resp: '16 16 17 17  ' Temp:   98.9 F (37.2 C) 98.5 F (36.9 C)  TempSrc:   Oral Oral  SpO2: 100% 100% 98% 95%  Weight:      Height:        Intake/Output Summary (Last 24 hours) at 05/12/2019 1034 Last data filed at 05/12/2019 1005 Gross per 24 hour  Intake 1179.97 ml  Output 590 ml  Net 589.97 ml   Filed Weights   05/10/19 1501 05/10/19 2302  Weight: 85.8 kg 81 kg    Examination: EOMI NCAT no distress no icterus no pallor Chest clear S1-S2 no murmur Abdomen soft slightly tender no rebound Neurologically intact no focal deficit  Data Reviewed: I have personally reviewed following labs and imaging studies  CBC: Recent Labs  Lab 05/10/19 1224 05/11/19 0352 05/12/19 0812  WBC 28.1* 26.8* 23.8*  NEUTROABS 6.6  --  4.3  HGB 11.9* 11.0* 11.4*  HCT 38.5* 36.4* 36.3*  MCV 98.7 101.1* 97.8  PLT 592* 484* 415*   Basic Metabolic Panel: Recent Labs  Lab 05/10/19 1224 05/11/19 0352 05/12/19 0812  NA 135 139 136  K 3.5 3.7 3.6  CL 99 101 101  CO2 '25 25 30  ' GLUCOSE 106* 93 100*  BUN  '14 13 13  ' CREATININE 1.03 1.00 0.91  CALCIUM 9.1 9.0 8.7*  PHOS  --  3.9  --    GFR: Estimated Creatinine Clearance: 85.3 mL/min (by C-G formula based on SCr of 0.91 mg/dL). Liver Function Tests: Recent Labs  Lab 05/10/19 1224 05/11/19 0352 05/12/19 0812  AST '30 19 16  ' ALT 75* 55* 38  ALKPHOS 391* 325* 267*  BILITOT 0.7 0.8 0.5  PROT 7.9 6.7 6.6  ALBUMIN 3.6 3.2*  3.0* 3.0*   No results for input(s):  LIPASE, AMYLASE in the last 168 hours. No results for input(s): AMMONIA in the last 168 hours. Coagulation Profile: Recent Labs  Lab 05/10/19 1224  INR 1.0   Cardiac Enzymes:  Radiology Studies: Reviewed images personally in health database   Scheduled Meds: . buPROPion  300 mg Oral Daily  . folic acid  1 mg Oral Daily  . multivitamin with minerals  1 tablet Oral Daily  . oxyCODONE-acetaminophen  2 tablet Oral Q4H  . sodium chloride flush  5 mL Intracatheter Q8H  . thiamine  100 mg Oral Daily   Or  . thiamine  100 mg Intravenous Daily  . traZODone  100 mg Oral QHS   Continuous Infusions: . sodium chloride    . piperacillin-tazobactam (ZOSYN)  IV 3.375 g (05/12/19 0604)     LOS: 2 days   Time spent: [place], MD [facility]  If 7PM-7AM, please contact night-coverage-look on AMION to find my number otherwise-prefer pages-not epic chat,please 05/12/2019, 10:34 AM

## 2019-05-12 NOTE — Progress Notes (Signed)
Initial Nutrition Assessment  DOCUMENTATION CODES:   Not applicable  INTERVENTION:   -Ensure Enlive po BID, each supplement provides 350 kcal and 20 grams of protein  NUTRITION DIAGNOSIS:   Inadequate oral intake related to acute illness as evidenced by per patient/family report.  GOAL:   Patient will meet greater than or equal to 90% of their needs  MONITOR:   PO intake, Supplement acceptance, Weight trends, TF tolerance, I & O's  REASON FOR ASSESSMENT:   Malnutrition Screening Tool    ASSESSMENT:   68 y.o. male with medical history significant of CLL currently not on treatment, hypertension, GERD, depression, cholecystitis and recent lap chole on 9/15 presenting to the ED for evaluation of shortness of breath.  **RD working remotely**  Patient reports SOB, abdominal pain and not eating well PTA since gallbladder surgery on 9/15. Pt currently consuming 30-50% of meals today, states he doesn't like the hospital food. Pt would benefit from nutritional supplements given ETOH use , recent weight loss and suspected poor PO intake. Will order Ensure supplements.  Per weight records, pt has lost 15 lbs since 7/26 (7% wt loss x 2.5 months, significant for time frame).  I/Os: +91 ml since admit UOP 400 ml so far today  Labs reviewed. Medications: Folic acid tablet daily, Multivitamin with minerals daily, Thiamine tablet  NUTRITION - FOCUSED PHYSICAL EXAM:  Unable to perform -working remotely.  Diet Order:   Diet Order            DIET SOFT Room service appropriate? Yes; Fluid consistency: Thin  Diet effective now              EDUCATION NEEDS:   No education needs have been identified at this time  Skin:  Skin Assessment: Reviewed RN Assessment  Last BM:  10/4  Height:   Ht Readings from Last 1 Encounters:  05/10/19 6' (1.829 m)    Weight:   Wt Readings from Last 1 Encounters:  05/10/19 81 kg    Ideal Body Weight:  80.9 kg  BMI:  Body mass index is  24.22 kg/m.  Estimated Nutritional Needs:   Kcal:  2000-2200  Protein:  90-100g  Fluid:  2L/day  Clayton Bibles, MS, RD, LDN Inpatient Clinical Dietitian Pager: [phone] After Hours Pager: [phone]

## 2019-05-13 ENCOUNTER — Inpatient Hospital Stay (HOSPITAL_COMMUNITY): Payer: Medicare Other

## 2019-05-13 LAB — CBC WITH DIFFERENTIAL/PLATELET
Abs Immature Granulocytes: 0 10*3/uL (ref 0.00–0.07)
Basophils Absolute: 0 10*3/uL (ref 0.0–0.1)
Basophils Relative: 0 %
Eosinophils Absolute: 0.5 10*3/uL (ref 0.0–0.5)
Eosinophils Relative: 2 %
HCT: 35.5 % — ABNORMAL LOW (ref 39.0–52.0)
Hemoglobin: 10.8 g/dL — ABNORMAL LOW (ref 13.0–17.0)
Lymphocytes Relative: 57 %
Lymphs Abs: 13.2 10*3/uL — ABNORMAL HIGH (ref 0.7–4.0)
MCH: 30.3 pg (ref 26.0–34.0)
MCHC: 30.4 g/dL (ref 30.0–36.0)
MCV: 99.7 fL (ref 80.0–100.0)
Monocytes Absolute: 0.7 10*3/uL (ref 0.1–1.0)
Monocytes Relative: 3 %
Neutro Abs: 8.8 10*3/uL — ABNORMAL HIGH (ref 1.7–7.7)
Neutrophils Relative %: 38 %
Platelets: 426 10*3/uL — ABNORMAL HIGH (ref 150–400)
RBC: 3.56 MIL/uL — ABNORMAL LOW (ref 4.22–5.81)
RDW: 13 % (ref 11.5–15.5)
WBC: 23.2 10*3/uL — ABNORMAL HIGH (ref 4.0–10.5)
nRBC: 0 % (ref 0.0–0.2)

## 2019-05-13 LAB — BASIC METABOLIC PANEL
Anion gap: 13 (ref 5–15)
BUN: 11 mg/dL (ref 8–23)
CO2: 23 mmol/L (ref 22–32)
Calcium: 8.6 mg/dL — ABNORMAL LOW (ref 8.9–10.3)
Chloride: 103 mmol/L (ref 98–111)
Creatinine, Ser: 0.89 mg/dL (ref 0.61–1.24)
GFR calc Af Amer: >60 mL/min (ref >60)
GFR calc non Af Amer: >60 mL/min (ref >60)
Glucose, Bld: 95 mg/dL (ref 70–99)
Potassium: 3.7 mmol/L (ref 3.5–5.1)
Sodium: 139 mmol/L (ref 135–145)

## 2019-05-13 MED ORDER — OXYCODONE-ACETAMINOPHEN 5-325 MG PO TABS: 2.0000 | ORAL_TABLET | ORAL | 0 refills | Status: DC

## 2019-05-13 MED ORDER — AMOXICILLIN-POT CLAVULANATE 875-125 MG PO TABS: 1.0000 | ORAL_TABLET | Freq: Two times a day (BID) | ORAL | 0 refills | Status: DC

## 2019-05-13 MED ORDER — IOHEXOL 300 MG/ML  SOLN
100.0000 mL | Freq: Once | INTRAMUSCULAR | Status: AC | PRN
  Administered 2019-05-13: 100 mL via INTRAVENOUS

## 2019-05-13 MED ORDER — SODIUM CHLORIDE (PF) 0.9 % IJ SOLN
INTRAMUSCULAR | Status: AC
  Filled 2019-05-13: qty 50

## 2019-05-13 NOTE — Progress Notes (Addendum)
Patient ID: Bascom Levels, male   DOB: 01-24-1951, 68 y.o.   MRN: [id number]    Referring Physician(s): Dr. Rosendo Gros   Supervising Physician: Jacqulynn Cadet  Patient Status:  [facility] - [facility]  Chief Complaint:  Biliary leak into GB fossa post cholecystectomy  Subjective:  Patient reports some discomfort around incision site. He denies fever and vomiting. He is awaiting CT results for discharge.   Allergies: Patient has no known allergies.  Medications: Prior to Admission medications   Medication Sig Start Date End Date Taking? Authorizing Provider  acetaminophen (TYLENOL) 325 MG tablet Take 2 tablets (650 mg total) by mouth every 6 (six) hours as needed for mild pain (or temp > 100). 03/07/19  Yes Earnstine Regal, PA-C  ALPRAZolam Duanne Moron) 0.25 MG tablet Take 0.25 mg by mouth at bedtime as needed for anxiety.   Yes [provider]  buPROPion (WELLBUTRIN XL) 300 MG 24 hr tablet Take 300 mg by mouth daily.    Yes [provider]  docusate sodium (COLACE) 100 MG capsule Take 1 capsule (100 mg total) by mouth daily as needed for mild constipation. Patient taking differently: Take 100 mg by mouth daily.  04/23/19  Yes Oretha Milch D, MD  esomeprazole (NEXIUM) 40 MG capsule Take 40 mg by mouth daily.    Yes [provider]  ibuprofen (ADVIL) 200 MG tablet Take 200 mg by mouth every 6 (six) hours as needed for mild pain.   Yes [provider]  lisinopril (ZESTRIL) 20 MG tablet Take 20 mg by mouth daily.   Yes [provider]  traZODone (DESYREL) 100 MG tablet Take 100 mg by mouth at bedtime.   Yes [provider]  triamcinolone cream (KENALOG) 0.1 % Apply 1 application topically daily as needed (rash).   Yes [provider]  oxyCODONE (OXY IR/ROXICODONE) 5 MG immediate release tablet Take 1 tablet (5 mg total) by mouth every 6 (six) hours as needed for severe pain. Patient not taking: Reported on 05/10/2019 04/20/19   Greer Pickerel,  MD     Vital Signs: BP (!) 142/76 (BP Location: Right Arm)    Pulse 66    Temp 98 F (36.7 C) (Oral)    Resp 18    Ht 6' (1.829 m)    Wt 178 lb 9.2 oz (81 kg)    SpO2 94%    BMI 24.22 kg/m   Physical Exam Alert and oriented. Tenderness to palpation in RUQ. Incision clean, dry. Drain intact with <10cc serosanguinous fluid.   Imaging: Dg Chest 2 View  Result Date: 05/10/2019 CLINICAL DATA:  Acute shortness of breath and chest pain. Dry cough. EXAM: CHEST - 2 VIEW COMPARISON:  02/27/2019 FINDINGS: The cardiomediastinal silhouette is unremarkable. There is no evidence of focal airspace disease, pulmonary edema, suspicious pulmonary nodule/mass, pleural effusion, or pneumothorax. No acute bony abnormalities are identified. IMPRESSION: No active cardiopulmonary disease. Electronically Signed   By: Margarette Canada M.D.   On: 05/10/2019 15:03   Ct Abdomen Pelvis W Contrast  Result Date: 05/10/2019 CLINICAL DATA:  Acute abdominal pain, recent cholecystectomy EXAM: CT ABDOMEN AND PELVIS WITH CONTRAST TECHNIQUE: Multidetector CT imaging of the abdomen and pelvis was performed using the standard protocol following bolus administration of intravenous contrast. CONTRAST:  163mL OMNIPAQUE IOHEXOL 300 MG/ML  SOLN COMPARISON:  Hepatobiliary nuclear medicine scan dated 04/22/2019. CT abdomen/pelvis dated 04/21/2019. Trace right pleural effusion. FINDINGS: Lower chest: Trace right pleural effusion. Hepatobiliary: Liver is within normal limits. Status post cholecystectomy. Progressive curvilinear  fluid collection in the gallbladder fossa and along the anterior/superior liver, measuring approximately 2.5 x 2.6 x 6.5 cm (series 2/image 13; coronal image 29). This is progressive from the prior and more discrete/well-defined. Given the absence of visualized bile leak on prior CT, this appearance is considered suspicious for abscess, with biloma considered less likely. No intrahepatic or extrahepatic ductal dilatation.  Pancreas: Within normal limits. Spleen: Within normal limits. Adrenals/Urinary Tract: Adrenal glands are within normal limits. Kidneys are within normal limits.  No hydronephrosis. Bladder is mildly thick-walled although underdistended. Stomach/Bowel: Stomach is within normal limits. No evidence of bowel obstruction. Prior appendectomy. Status post left hemicolectomy with suture line in the lower pelvis (series 2/image 85). Vascular/Lymphatic: No evidence of abdominal aortic aneurysm. Atherosclerotic calcifications of the abdominal aorta and branch vessels. No suspicious abdominopelvic lymphadenopathy. Reproductive: Prostate is unremarkable. Other: No abdominopelvic ascites. Prior lower abdominal ventral hernia. Tiny fat containing right inguinal hernia (series 2/image 92). Musculoskeletal: Mild degenerative changes of the visualized thoracolumbar spine. IMPRESSION: Status post cholecystectomy. 6.5 cm curvilinear fluid collection in the gallbladder fossa, suspicious for abscess, less likely biloma in the setting of prior negative hepatobiliary nuclear medicine scan. Additional ancillary findings as above. Electronically Signed   By: Julian Hy M.D.   On: 05/10/2019 16:07   Ct Image Guided Drainage By Percutaneous Catheter  Result Date: 05/11/2019 INDICATION: History of laparoscopic cholecystectomy, now with gallbladder fossa fluid collection extending to the perihepatic space worrisome for infection. Please perform image guided drainage catheter placement for infection source control purposes. EXAM: CT AND ULTRASOUND GUIDED DRAINAGE BY PERCUTANEOUS CATHETER COMPARISON:  CT abdomen and pelvis - 05/10/2019 MEDICATIONS: The patient is currently admitted to the hospital and receiving intravenous antibiotics. The antibiotics were administered within an appropriate time frame prior to the initiation of the procedure. ANESTHESIA/SEDATION: Moderate (conscious) sedation was employed during this procedure. A total  of Versed 4 mg and Fentanyl 100 mcg was administered intravenously. Moderate Sedation Time: 20 minutes. The patient's level of consciousness and vital signs were monitored continuously by radiology nursing throughout the procedure under my direct supervision. CONTRAST:  None COMPLICATIONS: None immediate. PROCEDURE: Informed written consent was obtained from the patient after a discussion of the risks, benefits and alternatives to treatment. The patient was placed supine on the CT gantry and a pre procedural CT was performed re-demonstrating the known abscess/fluid collection within the gallbladder fossa in perihepatic space with dominant component within the gallbladder fossa measuring approximately 3.2 x 3.0 cm (image 31, series 2). The procedure was planned. A timeout was performed prior to the initiation of the procedure. The skin overlying the right upper abdomen was prepped and draped in the usual sterile fashion. The overlying soft tissues were anesthetized with 1% lidocaine with epinephrine. Under direct ultrasound guidance, the dominant component about the perihepatic space was targeted with an 18 gauge trocar needle and a short Amplatz wire was coiled within the collection. CT imaging was performed however it was felt as the source of the residual fluid was likely from the gallbladder fossa the initial attempted access site was a bandage. Again, under direct ultrasound guidance, the dominant component within the gallbladder fossa was targeted with an 18 gauge trocar needle and a short Amplatz wire was coiled within collection. CT imaging was performed demonstrating appropriate position with the Amplatz wire coiled within the gallbladder fossa. The tract was serially dilated allowing placement of a 10 [place] all-purpose drainage catheter. Appropriate positioning was confirmed with a limited postprocedural CT scan. 20 cc  of purulent, slightly bilious appearing fluid was aspirated. The tube was connected to a  JP bulb and sutured in place. A dressing was placed. The patient tolerated the procedure well without immediate post procedural complication. IMPRESSION: Successful CT guided placement of a 10 French all purpose drain catheter into the gallbladder fossa with aspiration of 20 cc of purulent, slightly bilious appearing fluid. Samples were sent to the laboratory as requested by the ordering clinical team. Electronically Signed   By: Sandi Mariscal M.D.   On: 05/11/2019 18:35    Labs:  CBC: Recent Labs    05/10/19 1224 05/11/19 0352 05/12/19 0812 05/13/19 0312  WBC 28.1* 26.8* 23.8* 23.2*  HGB 11.9* 11.0* 11.4* 10.8*  HCT 38.5* 36.4* 36.3* 35.5*  PLT 592* 484* 434* 426*    COAGS: Recent Labs    03/01/19 0254 03/01/19 0959 05/10/19 1224  INR 1.1  --  1.0  APTT  --  35 33    BMP: Recent Labs    05/10/19 1224 05/11/19 0352 05/12/19 0812 05/13/19 0312  NA 135 139 136 139  K 3.5 3.7 3.6 3.7  CL 99 101 101 103  CO2 25 25 30 23   GLUCOSE 106* 93 100* 95  BUN 14 13 13 11   CALCIUM 9.1 9.0 8.7* 8.6*  CREATININE 1.03 1.00 0.91 0.89  GFRNONAA >60 >60 >60 >60  GFRAA >60 >60 >60 >60    LIVER FUNCTION TESTS: Recent Labs    04/22/19 0251 05/10/19 1224 05/11/19 0352 05/12/19 0812  BILITOT 1.8* 0.7 0.8 0.5  AST 23 30 19 16   ALT 26 75* 55* 38  ALKPHOS 58 391* 325* 267*  PROT 5.5* 7.9 6.7 6.6  ALBUMIN 3.1* 3.6 3.2*   3.0* 3.0*    Assessment and Plan:  Post-op fluid collection in gallbladder fossa. Drain placed 10/6 with 20cc bilious fluid aspirated. Patient awaiting CT results as output remains minimal since drain placement. WBC 23.2 (improving). ABX will continue at home if patient discharge approved after scan.      Addendum: f/u CT today reviewed by Dr. Laurence Ferrari and no sig residual fluid seen at GB fossa drain site; drain removed in its entirety without immediate complications; gauze dressing applied to site  Electronically Signed: D. Rowe Robert, PA-C/ Mickie Kay  PA-S 05/13/2019, 3:58 PM   I spent a total of 20 minutes at the the patient's bedside AND on the patient's hospital floor or unit, greater than 50% of which was counseling/coordinating care for gallbladder drain

## 2019-05-13 NOTE — Care Management Important Message (Signed)
Important Message  Patient Details IM Letter given to [name] SW to present to the Patient Name: Jonathon Parker MRN: [id number] Date of Birth: January 22, 1951   Medicare Important Message Given:  Yes     Kerin Salen 05/13/2019, 9:38 AM

## 2019-05-13 NOTE — Progress Notes (Signed)
Discharge orders discussed with patient and wife, verbalized agreement and understanding

## 2019-05-14 NOTE — Discharge Summary (Signed)
Physician Discharge Summary  Jonathon Parker [name]:[id number] DOB: [birth year] DOA: 05/10/2019  PCP: Nicoletta Dress, MD  Admit date: 05/10/2019 Discharge date: 05/14/2019  Time spent: 30 minutes  Recommendations for Outpatient Follow-up:  1. Follow up as OP for cmet and cbc 1 week 2. Needs OP PCPC follow up 3. Limited Rx for pain meds given on d/c  4. Make sure has hemagioma live rfollow up with MRI liver as OP  Discharge Diagnoses:  Principal Problem:   Abnormal findings on diagnostic imaging of gall bladder Active Problems:   Essential hypertension   Dyspnea   Thrombocytosis (HCC)   Alcohol dependence ([place])   Discharge Condition: good  Diet recommendation: stable  Filed Weights   05/10/19 1501 05/10/19 2302  Weight: 85.8 kg 81 kg    History of present illness:  66 M lap chole CLL (not on Rx under surveillance), HTN, reflux, depression hemangioma seen 01/23/2017 prior?  Colonic polyp status post sigmoid colectomy 2002--initially admitted abdominal pain prior perc cholecystotomy 7/26-8/2-Underwent ERCP sphincterotomy and balloon sweep + drainage-tube exchange 8/20 found to have gallstones + cystic duct at follow-up  Electively readmitted-underwent lap chole 04/20/2019 by Dr. Redmond Pulling general surgery-DC home on readmit 9/16 2/2 acute abdominal pain, fever, SOB--follow-up?  Postop atelectasis-DC + cefdinir 9/18- Return to [facility] ED 10/5+ S OB + RUQ pain + anorexia + low-grade fever despite using cefdinir CT abdomen pelvis = 6.5 curvilinear collection gallbladder fossa?  Abscess-General surgery Dr. Rosendo Gros consulted-IR consulted at his request and place ddrain Rest as below   Hospital Course:  Gallbladder fossa abscess 6.5 cm, transaminitis predominant alk phos elevation--was on Zosyn- cult showed Bacteroides- d/c on Augmentin + p.o. Percocet 12/06/2023 X2 iv x/day, saline locked prior to d/c Drain was removed After IR imaged area of drain and found no persistent collection  CLL with  leukocytosis-white count now down from 26-23--Needs OP labs ~ 1 week  Liver hemangioma-May need imaging in outpatient setting  Colonic polyp status post sigmoid colectomy 2002-outpatient screening  HTN-during osp stay held lisinopril 20-resumed on d/c-placed on hydralazine as PRN IV dosages  Depression continue trazodone 100 at bedtime, Wellbutrin 300 daily, Xanax 0.25 at bedtime as needed when able to take p.o.  Procedures:  IR drain placed and removed on d/c   Consultations:  IR   Gen surg  Discharge Exam: Vitals:   05/13/19 0612 05/13/19 1412  BP: 131/80 (!) 142/76  Pulse: 68 66  Resp: 18 18  Temp: 98.4 F (36.9 C) 98 F (36.7 C)  SpO2: 92% 94%    General: awake coherent in nad no distress eomi ncat Cardiovascular: s1 s2 no m/r/g rrr Respiratory: clear no adde dosudn abd soft no rebound no guard Neuro intact no focal deficit  Discharge Instructions   Discharge Instructions    Diet - low sodium heart healthy   Complete by: As directed    Discharge instructions   Complete by: As directed    Complete all antibiotics Limited Rx of percocet given oin d/c for severe pain-use tylenol for 1st choice for pain control and for more severe pain, can use 4 x ibuprofen 200 mg to make up 800 mg every 4-6 hrs   Increase activity slowly   Complete by: As directed      Allergies as of 05/13/2019   No Known Allergies     Medication List    STOP taking these medications   ibuprofen 200 MG tablet Commonly known as: ADVIL     TAKE these medications  acetaminophen 325 MG tablet Commonly known as: TYLENOL Take 2 tablets (650 mg total) by mouth every 6 (six) hours as needed for mild pain (or temp > 100).   ALPRAZolam 0.25 MG tablet Commonly known as: XANAX Take 0.25 mg by mouth at bedtime as needed for anxiety.   amoxicillin-clavulanate 875-125 MG tablet Commonly known as: AUGMENTIN Take 1 tablet by mouth every 12 (twelve) hours.   buPROPion 300 MG 24 hr  tablet Commonly known as: WELLBUTRIN XL Take 300 mg by mouth daily.   docusate sodium 100 MG capsule Commonly known as: COLACE Take 1 capsule (100 mg total) by mouth daily as needed for mild constipation. What changed: when to take this   esomeprazole 40 MG capsule Commonly known as: NEXIUM Take 40 mg by mouth daily.   lisinopril 20 MG tablet Commonly known as: ZESTRIL Take 20 mg by mouth daily.   oxyCODONE 5 MG immediate release tablet Commonly known as: Oxy IR/ROXICODONE Take 1 tablet (5 mg total) by mouth every 6 (six) hours as needed for severe pain.   oxyCODONE-acetaminophen 5-325 MG tablet Commonly known as: PERCOCET/ROXICET Take 2 tablets by mouth every 4 (four) hours.   traZODone 100 MG tablet Commonly known as: DESYREL Take 100 mg by mouth at bedtime.   triamcinolone cream 0.1 % Commonly known as: KENALOG Apply 1 application topically daily as needed (rash).      No Known Allergies Follow-up Information    Greer Pickerel, MD. Schedule an appointment as soon as possible for a visit in 2 week(s).   Specialty: General Surgery Contact information: [place] [address] [phone]            The results of significant diagnostics from this hospitalization (including imaging, microbiology, ancillary and laboratory) are listed below for reference.    Significant Diagnostic Studies: Dg Chest 2 View  Result Date: 05/10/2019 CLINICAL DATA:  Acute shortness of breath and chest pain. Dry cough. EXAM: CHEST - 2 VIEW COMPARISON:  02/27/2019 FINDINGS: The cardiomediastinal silhouette is unremarkable. There is no evidence of focal airspace disease, pulmonary edema, suspicious pulmonary nodule/mass, pleural effusion, or pneumothorax. No acute bony abnormalities are identified. IMPRESSION: No active cardiopulmonary disease. Electronically Signed   By: Margarette Canada M.D.   On: 05/10/2019 15:03   Dg Chest 2 View  Result Date: 04/21/2019 CLINICAL  DATA:  Postop fever, cholecystectomy 1 day prior EXAM: CHEST - 2 VIEW COMPARISON:  Radiograph 02/27/2011 FINDINGS: Chronic elevation of the right hemidiaphragm. Streaky opacities in the right lung base most suggestive of postoperative atelectasis. No consolidation, features of edema, pneumothorax, or effusion. Pulmonary vascularity is normally distributed. The cardiomediastinal contours are unremarkable. No acute osseous or soft tissue abnormality. No convincing evidence of free subdiaphragmatic air. IMPRESSION: Streaky opacities in the right lung base most suggestive of postoperative atelectasis. No other acute cardiopulmonary disease. Electronically Signed   By: Lovena Le M.D.   On: 04/21/2019 20:11   Ct Angio Chest Pe W And/or Wo Contrast  Result Date: 04/21/2019 CLINICAL DATA:  68 year old male with lower chest pain, upper abdominal pain. Postoperative day 1 cholecystectomy. Chronic lymphocytic leukemia. EXAM: CT ANGIOGRAPHY CHEST WITH CONTRAST TECHNIQUE: Multidetector CT imaging of the chest was performed using the standard protocol during bolus administration of intravenous contrast. Multiplanar CT image reconstructions and MIPs were obtained to evaluate the vascular anatomy. CONTRAST:  163m OMNIPAQUE IOHEXOL 350 MG/ML SOLN COMPARISON:  CT Abdomen and Pelvis today are reported separately. Chest radiographs earlier today. FINDINGS:  Cardiovascular: Good contrast bolus timing in the pulmonary arterial tree. Respiratory motion. No convincing No focal filling defect identified in the pulmonary arteries to suggest acute pulmonary embolism. Negative visible aorta. Cardiac size within effusion normal limits. No pericardial. Mediastinum/Nodes: Negative. Lungs/Pleura: Elevated right hemidiaphragm. Small to moderate layering right pleural effusion. Adjacent enhancing right lung atelectasis. Atelectatic changes to the major airways which otherwise appear patent. Mild enhancing atelectasis in the dependent left  lower lobe. No other pulmonary process. Upper Abdomen: Reported separately today. Musculoskeletal: No acute osseous abnormality identified. Review of the MIP images confirms the above findings. IMPRESSION: 1. Respiratory motion but good contrast bolus timing with no convincing pulmonary embolus. 2. Elevated right hemidiaphragm with small to moderate layering right pleural effusion and right greater than left lung atelectasis. 3. See also CT Abdomen and Pelvis reported separately. Electronically Signed   By: Genevie Ann M.D.   On: 04/21/2019 21:34   Ct Abdomen Pelvis W Contrast  Result Date: 05/14/2019 CLINICAL DATA:  Gallbladder abscess. History of chronic lymphocytic leukemia. EXAM: CT ABDOMEN AND PELVIS WITH CONTRAST TECHNIQUE: Multidetector CT imaging of the abdomen and pelvis was performed using the standard protocol following bolus administration of intravenous contrast. CONTRAST:  172m OMNIPAQUE IOHEXOL 300 MG/ML  SOLN COMPARISON:  CT scan of May 10, 2019. FINDINGS: Lower chest: Moderate right pleural effusion is noted with adjacent atelectasis of the right lower lobe. Hepatobiliary: Status post cholecystectomy. Interval placement of pigtail drainage catheter into previously noted fluid collection in the gallbladder fossa. This fluid collection appears to be significantly decompressed with only minimal residual fluid remaining. However, there remains a small amount of fluid around the superior portion of the liver which was present on prior exam. No biliary dilatation is noted. Pancreas: Unremarkable. No pancreatic ductal dilatation or surrounding inflammatory changes. Spleen: Normal in size without focal abnormality. Adrenals/Urinary Tract: Adrenal glands are unremarkable. Kidneys are normal, without renal calculi, focal lesion, or hydronephrosis. Bladder is unremarkable. Stomach/Bowel: The stomach appears normal. Status post appendectomy. No evidence of bowel obstruction or ileus. Vascular/Lymphatic: No  significant vascular findings are present. No enlarged abdominal or pelvic lymph nodes. Reproductive: Prostate is unremarkable. Other: No abdominal wall hernia or abnormality. No abdominopelvic ascites. Musculoskeletal: No acute or significant osseous findings. IMPRESSION: Interval placement of pigtail drainage catheter into fluid collection noted on the gallbladder fossa on prior exam. There appears to be nearly complete decompression of this fluid collection. However, there remains a mild amount of fluid in the perihepatic region around the superior portion of the right hepatic lobe. Moderate right pleural effusion is noted with associated right lower lobe atelectasis. Electronically Signed   By: JMarijo ConceptionM.D.   On: 05/14/2019 10:26   Ct Abdomen Pelvis W Contrast  Result Date: 05/10/2019 CLINICAL DATA:  Acute abdominal pain, recent cholecystectomy EXAM: CT ABDOMEN AND PELVIS WITH CONTRAST TECHNIQUE: Multidetector CT imaging of the abdomen and pelvis was performed using the standard protocol following bolus administration of intravenous contrast. CONTRAST:  1044mOMNIPAQUE IOHEXOL 300 MG/ML  SOLN COMPARISON:  Hepatobiliary nuclear medicine scan dated 04/22/2019. CT abdomen/pelvis dated 04/21/2019. Trace right pleural effusion. FINDINGS: Lower chest: Trace right pleural effusion. Hepatobiliary: Liver is within normal limits. Status post cholecystectomy. Progressive curvilinear fluid collection in the gallbladder fossa and along the anterior/superior liver, measuring approximately 2.5 x 2.6 x 6.5 cm (series 2/image 13; coronal image 29). This is progressive from the prior and more discrete/well-defined. Given the absence of visualized bile leak on prior CT, this appearance is  considered suspicious for abscess, with biloma considered less likely. No intrahepatic or extrahepatic ductal dilatation. Pancreas: Within normal limits. Spleen: Within normal limits. Adrenals/Urinary Tract: Adrenal glands are within  normal limits. Kidneys are within normal limits.  No hydronephrosis. Bladder is mildly thick-walled although underdistended. Stomach/Bowel: Stomach is within normal limits. No evidence of bowel obstruction. Prior appendectomy. Status post left hemicolectomy with suture line in the lower pelvis (series 2/image 85). Vascular/Lymphatic: No evidence of abdominal aortic aneurysm. Atherosclerotic calcifications of the abdominal aorta and branch vessels. No suspicious abdominopelvic lymphadenopathy. Reproductive: Prostate is unremarkable. Other: No abdominopelvic ascites. Prior lower abdominal ventral hernia. Tiny fat containing right inguinal hernia (series 2/image 92). Musculoskeletal: Mild degenerative changes of the visualized thoracolumbar spine. IMPRESSION: Status post cholecystectomy. 6.5 cm curvilinear fluid collection in the gallbladder fossa, suspicious for abscess, less likely biloma in the setting of prior negative hepatobiliary nuclear medicine scan. Additional ancillary findings as above. Electronically Signed   By: Julian Hy M.D.   On: 05/10/2019 16:07   Ct Abdomen Pelvis W Contrast  Result Date: 04/21/2019 CLINICAL DATA:  67 year old male with lower chest pain, upper abdominal pain. Postoperative day 1 cholecystectomy. Chronic lymphocytic leukemia. EXAM: CT ABDOMEN AND PELVIS WITH CONTRAST TECHNIQUE: Multidetector CT imaging of the abdomen and pelvis was performed using the standard protocol following bolus administration of intravenous contrast. CONTRAST:  187m OMNIPAQUE IOHEXOL 350 MG/ML SOLN COMPARISON:  CTA chest today reported separately. CT Abdomen and Pelvis 01/16/2017. FINDINGS: Lower chest: Reported separately today. Elevation of the right hemidiaphragm is increased since 2018. Hepatobiliary: Ventral abdominal wall incision with no adverse features. Trace gas in the left rectus muscle. Small volume perihepatic fluid with simple fluid density. Regional mild inflammatory stranding.  Surgical clips in the gallbladder fossa where a small volume of fluid and trace gas is present. Mild pneumobilia, mostly non dependent in the left lobe. More moderate volume gas in the CBD. The CBD measures 8-9 millimeters, with no stone or other filling defect identified along its course. Pancreas: Pancreas remains within normal limits. Spleen: Negative. Adrenals/Urinary Tract: Normal adrenal glands. Bilateral renal enhancement and contrast excretion is symmetric and normal. Normal proximal ureters. No nephrolithiasis. Decompressed ureters. Unremarkable urinary bladder. Stomach/Bowel: Chronic rectosigmoid anastomosis with a nearby surgical clip is stable since 2018 with no adverse features. Mildly redundant but otherwise negative sigmoid. Negative descending colon. Redundant splenic flexure. The hepatic flexure remains within normal limits despite some regional inflammation. Negative right colon. Prior appendectomy, stable. Negative terminal ileum. No dilated small bowel. Mostly decompressed stomach and duodenum. There is a small volume of fluid in the gastrohepatic ligament and adjacent to the distal esophagus on series 4, image 15. There is evidence of a 2 centimeter duodenal diverticulum on coronal image 39. Trace pneumoperitoneum including between the left lobe of the liver and the diaphragm. Vascular/Lymphatic: Mild Aortoiliac calcified atherosclerosis. Major arterial structures are patent. Portal venous system is patent. Reproductive: Lower inguinal/space of Retzius prior hernia repair with mesh is stable since 2018. Otherwise negative. Other: Small volume free fluid in the right hemipelvis with slightly complex fluid density. There is also an associated small layering 4 millimeter calculus in the right hemipelvis on series 4, image 86. Musculoskeletal: Lower lumbar spine degeneration. No acute osseous abnormality identified. IMPRESSION: 1. Recent cholecystectomy with satisfactory appearance of the surgical  bed. Small volume perihepatic fluid.  Trace pneumoperitoneum. Pneumobilia, with 8-9 mm diameter CBD, but no other CBD filling defect. Small volume of mildly complex fluid layering in the pelvis along with a  4 mm calculus which might be a dropped gallstone (series 4, image 86). Ventral abdominal incision with no adverse features. 2. No other acute or inflammatory process in the abdomen or pelvis. 3. Lower chest findings reported with chest CTA today separately. Electronically Signed   By: Genevie Ann M.D.   On: 04/21/2019 21:44   Ct Image Guided Drainage By Percutaneous Catheter  Result Date: 05/11/2019 INDICATION: History of laparoscopic cholecystectomy, now with gallbladder fossa fluid collection extending to the perihepatic space worrisome for infection. Please perform image guided drainage catheter placement for infection source control purposes. EXAM: CT AND ULTRASOUND GUIDED DRAINAGE BY PERCUTANEOUS CATHETER COMPARISON:  CT abdomen and pelvis - 05/10/2019 MEDICATIONS: The patient is currently admitted to the hospital and receiving intravenous antibiotics. The antibiotics were administered within an appropriate time frame prior to the initiation of the procedure. ANESTHESIA/SEDATION: Moderate (conscious) sedation was employed during this procedure. A total of Versed 4 mg and Fentanyl 100 mcg was administered intravenously. Moderate Sedation Time: 20 minutes. The patient's level of consciousness and vital signs were monitored continuously by radiology nursing throughout the procedure under my direct supervision. CONTRAST:  None COMPLICATIONS: None immediate. PROCEDURE: Informed written consent was obtained from the patient after a discussion of the risks, benefits and alternatives to treatment. The patient was placed supine on the CT gantry and a pre procedural CT was performed re-demonstrating the known abscess/fluid collection within the gallbladder fossa in perihepatic space with dominant component within the  gallbladder fossa measuring approximately 3.2 x 3.0 cm (image 31, series 2). The procedure was planned. A timeout was performed prior to the initiation of the procedure. The skin overlying the right upper abdomen was prepped and draped in the usual sterile fashion. The overlying soft tissues were anesthetized with 1% lidocaine with epinephrine. Under direct ultrasound guidance, the dominant component about the perihepatic space was targeted with an 18 gauge trocar needle and a short Amplatz wire was coiled within the collection. CT imaging was performed however it was felt as the source of the residual fluid was likely from the gallbladder fossa the initial attempted access site was a bandage. Again, under direct ultrasound guidance, the dominant component within the gallbladder fossa was targeted with an 18 gauge trocar needle and a short Amplatz wire was coiled within collection. CT imaging was performed demonstrating appropriate position with the Amplatz wire coiled within the gallbladder fossa. The tract was serially dilated allowing placement of a 10 [place] all-purpose drainage catheter. Appropriate positioning was confirmed with a limited postprocedural CT scan. 20 cc of purulent, slightly bilious appearing fluid was aspirated. The tube was connected to a JP bulb and sutured in place. A dressing was placed. The patient tolerated the procedure well without immediate post procedural complication. IMPRESSION: Successful CT guided placement of a 10 French all purpose drain catheter into the gallbladder fossa with aspiration of 20 cc of purulent, slightly bilious appearing fluid. Samples were sent to the laboratory as requested by the ordering clinical team. Electronically Signed   By: Sandi Mariscal M.D.   On: 05/11/2019 18:35   Nm Hepato Biliary Leak  Result Date: 04/22/2019 CLINICAL DATA:  One day postop from cholecystectomy. Severe upper abdominal pain and nausea. Evaluate for postop bile leak. EXAM: NUCLEAR  MEDICINE HEPATOBILIARY IMAGING TECHNIQUE: Sequential images of the abdomen were obtained out to 60 minutes following intravenous administration of radiopharmaceutical. RADIOPHARMACEUTICALS:  5.5 mCi Tc-13m Choletec IV COMPARISON:  None. FINDINGS: Prompt uptake and biliary excretion of activity by the  liver is seen. No gallbladder activity is seen, consistent with prior cholecystectomy. Biliary activity passes into small bowel, consistent with patent common bile duct. No leak of biliary activity is seen. Reflux of biliary activity into the stomach is noted. IMPRESSION: Status post cholecystectomy. No evidence of postop bile leak or biliary obstruction. Bile reflux noted. Electronically Signed   By: Marlaine Hind M.D.   On: 04/22/2019 14:54    Microbiology: Recent Results (from the past 240 hour(s))  Blood Culture (routine x 2)     Status: None (Preliminary result)   Collection Time: 05/10/19  4:20 PM   Specimen: BLOOD  Result Value Ref Range Status   Specimen Description   Final    BLOOD LEFT ANTECUBITAL Performed at [facility], [address] [place], [place] [address]    Special Requests   Final    BOTTLES DRAWN AEROBIC AND ANAEROBIC Blood Culture adequate volume Performed at [facility], [address] [place], [place] [address]    Culture   Final    NO GROWTH 4 DAYS Performed at [facility], [place] [address] [place], [place] [address]    Report Status PENDING  Incomplete  Blood Culture (routine x 2)     Status: None (Preliminary result)   Collection Time: 05/10/19  4:55 PM   Specimen: BLOOD  Result Value Ref Range Status   Specimen Description   Final    BLOOD BLOOD RIGHT WRIST Performed at [facility], [address] [place], [place] [address]    Special Requests   Final    BOTTLES DRAWN AEROBIC AND ANAEROBIC Blood Culture adequate volume Performed at [facility], [address] [place], [place] [address]    Culture    Final    NO GROWTH 4 DAYS Performed at [facility], [place] [address] [place], [place] [address]    Report Status PENDING  Incomplete  SARS Coronavirus 2 [facility] order, Performed in [facility] hospital lab) Nasopharyngeal Nasopharyngeal Swab     Status: None   Collection Time: 05/10/19  5:33 PM   Specimen: Nasopharyngeal Swab  Result Value Ref Range Status   SARS Coronavirus 2 NEGATIVE NEGATIVE Final    Comment: (NOTE) If result is NEGATIVE SARS-CoV-2 target nucleic acids are NOT DETECTED. The SARS-CoV-2 RNA is generally detectable in upper and lower  respiratory specimens during the acute phase of infection. The lowest  concentration of SARS-CoV-2 viral copies this assay can detect is 250  copies / mL. A negative result does not preclude SARS-CoV-2 infection  and should not be used as the sole basis for treatment or other  patient management decisions.  A negative result may occur with  improper specimen collection / handling, submission of specimen other  than nasopharyngeal swab, presence of viral mutation(s) within the  areas targeted by this assay, and inadequate number of viral copies  (<250 copies / mL). A negative result must be combined with clinical  observations, patient history, and epidemiological information. If result is POSITIVE SARS-CoV-2 target nucleic acids are DETECTED. The SARS-CoV-2 RNA is generally detectable in upper and lower  respiratory specimens dur ing the acute phase of infection.  Positive  results are indicative of active infection with SARS-CoV-2.  Clinical  correlation with patient history and other diagnostic information is  necessary to determine patient infection status.  Positive results do  not rule out bacterial infection or co-infection with other viruses.  If result is PRESUMPTIVE POSTIVE SARS-CoV-2 nucleic acids MAY BE PRESENT.   A presumptive positive result was obtained on the submitted specimen  and confirmed on repeat testing.   While 2019 novel coronavirus  (SARS-CoV-2) nucleic acids may be present in the submitted sample  additional confirmatory testing may be necessary for epidemiological  and / or clinical management purposes  to differentiate between  SARS-CoV-2 and other Sarbecovirus currently known to infect humans.  If clinically indicated additional testing with an alternate test  methodology [phone]) is advised. The SARS-CoV-2 RNA is generally  detectable in upper and lower respiratory sp ecimens during the acute  phase of infection. The expected result is Negative. Fact Sheet for Patients:  StrictlyIdeas.no Fact Sheet for Healthcare Providers: BankingDealers.co.za This test is not yet approved or cleared by the [place] FDA and has been authorized for detection and/or diagnosis of SARS-CoV-2 by FDA under an Emergency Use Authorization (EUA).  This EUA will remain in effect (meaning this test can be used) for the duration of the COVID-19 declaration under Section 564(b)(1) of the Act, 21 U.S.C. section 360bbb-3(b)(1), unless the authorization is terminated or revoked sooner. Performed at [facility], [address] [place], [place] [address]   Aerobic/Anaerobic Culture (surgical/deep wound)     Status: None (Preliminary result)   Collection Time: 05/11/19 10:50 AM   Specimen: Abscess  Result Value Ref Range Status   Specimen Description   Final    ABSCESS Performed at [place] [address], [place], [place] [address]    Special Requests   Final    Immunocompromised Performed at [facility], [place] [address], [place], [place] [address]    Gram Stain   Final    ABUNDANT WBC PRESENT, PREDOMINANTLY PMN NO ORGANISMS SEEN    Culture   Final    ABUNDANT BACTEROIDES OVATUS BETA LACTAMASE POSITIVE Performed at [facility], [place] [address], [place], [place] [address]    Report Status  PENDING  Incomplete     Labs: Basic Metabolic Panel: Recent Labs  Lab 05/10/19 1224 05/11/19 0352 05/12/19 0812 05/13/19 0312  NA 135 139 136 139  K 3.5 3.7 3.6 3.7  CL 99 101 101 103  CO2 '25 25 30 23  ' GLUCOSE 106* 93 100* 95  BUN '14 13 13 11  ' CREATININE 1.03 1.00 0.91 0.89  CALCIUM 9.1 9.0 8.7* 8.6*  PHOS  --  3.9  --   --    Liver Function Tests: Recent Labs  Lab 05/10/19 1224 05/11/19 0352 05/12/19 0812  AST '30 19 16  ' ALT 75* 55* 38  ALKPHOS 391* 325* 267*  BILITOT 0.7 0.8 0.5  PROT 7.9 6.7 6.6  ALBUMIN 3.6 3.2*  3.0* 3.0*   No results for input(s): LIPASE, AMYLASE in the last 168 hours. No results for input(s): AMMONIA in the last 168 hours. CBC: Recent Labs  Lab 05/10/19 1224 05/11/19 0352 05/12/19 0812 05/13/19 0312  WBC 28.1* 26.8* 23.8* 23.2*  NEUTROABS 6.6  --  4.3 8.8*  HGB 11.9* 11.0* 11.4* 10.8*  HCT 38.5* 36.4* 36.3* 35.5*  MCV 98.7 101.1* 97.8 99.7  PLT 592* 484* 434* 426*   Cardiac Enzymes: No results for input(s): CKTOTAL, CKMB, CKMBINDEX, TROPONINI in the last 168 hours. BNP: BNP (last 3 results) No results for input(s): BNP in the last 8760 hours.  ProBNP (last 3 results) No results for input(s): PROBNP in the last 8760 hours.  CBG: No  results for input(s): GLUCAP in the last 168 hours.     Signed:  Nita Sells MD   [facility] 05/14/2019, 3:59 PM

## 2019-05-15 LAB — CULTURE, BLOOD (ROUTINE X 2)
Culture: NO GROWTH
Culture: NO GROWTH
Special Requests: ADEQUATE
Special Requests: ADEQUATE

## 2019-05-17 ENCOUNTER — Other Ambulatory Visit (HOSPITAL_COMMUNITY): Payer: Medicare Other

## 2019-05-18 LAB — AEROBIC/ANAEROBIC CULTURE W GRAM STAIN (SURGICAL/DEEP WOUND)

## 2019-05-31 DIAGNOSIS — C9110 Chronic lymphocytic leukemia of B-cell type not having achieved remission: Secondary | ICD-10-CM

## 2019-06-01 ENCOUNTER — Other Ambulatory Visit (HOSPITAL_COMMUNITY): Payer: Self-pay | Admitting: General Surgery

## 2019-06-01 ENCOUNTER — Other Ambulatory Visit: Payer: Self-pay | Admitting: General Surgery

## 2019-06-01 DIAGNOSIS — J90 Pleural effusion, not elsewhere classified: Secondary | ICD-10-CM

## 2019-06-04 ENCOUNTER — Other Ambulatory Visit: Payer: Self-pay

## 2019-06-04 ENCOUNTER — Ambulatory Visit (HOSPITAL_COMMUNITY)
Admission: RE | Admit: 2019-06-04 | Discharge: 2019-06-04 | Disposition: A | Discharge status: Home / Self Care | Payer: Medicare Other | Source: Ambulatory Visit | Attending: General Surgery | Admitting: General Surgery

## 2019-06-04 ENCOUNTER — Other Ambulatory Visit (HOSPITAL_COMMUNITY): Payer: Self-pay | Admitting: General Surgery

## 2019-06-04 DIAGNOSIS — J90 Pleural effusion, not elsewhere classified: Secondary | ICD-10-CM

## 2019-06-04 MED ORDER — LIDOCAINE HCL 1 % IJ SOLN
INTRAMUSCULAR | Status: AC
  Filled 2019-06-04: qty 20

## 2019-08-30 DIAGNOSIS — I10 Essential (primary) hypertension: Secondary | ICD-10-CM | POA: Diagnosis not present

## 2019-08-30 DIAGNOSIS — R079 Chest pain, unspecified: Secondary | ICD-10-CM | POA: Diagnosis not present

## 2019-08-30 DIAGNOSIS — R0602 Shortness of breath: Secondary | ICD-10-CM | POA: Diagnosis not present

## 2019-08-30 DIAGNOSIS — F329 Major depressive disorder, single episode, unspecified: Secondary | ICD-10-CM | POA: Diagnosis not present

## 2019-08-30 DIAGNOSIS — R0600 Dyspnea, unspecified: Secondary | ICD-10-CM | POA: Diagnosis not present

## 2019-08-30 DIAGNOSIS — F5104 Psychophysiologic insomnia: Secondary | ICD-10-CM | POA: Diagnosis not present

## 2019-08-30 DIAGNOSIS — Z23 Encounter for immunization: Secondary | ICD-10-CM | POA: Diagnosis not present

## 2019-09-08 ENCOUNTER — Other Ambulatory Visit: Payer: Self-pay

## 2019-09-08 ENCOUNTER — Encounter: Payer: Self-pay | Admitting: Emergency Medicine

## 2019-09-08 ENCOUNTER — Ambulatory Visit: Payer: Medicare PPO | Admitting: Emergency Medicine

## 2019-09-08 DIAGNOSIS — R0602 Shortness of breath: Secondary | ICD-10-CM | POA: Diagnosis not present

## 2019-09-08 NOTE — Patient Instructions (Signed)
We will perform full pulmonary function testing  Walking oximetry today on room air Follow with Dr. Lamonte Sakai next available with full pulmonary function testing on the same day.

## 2019-09-08 NOTE — Addendum Note (Signed)
Addended by: Tery Sanfilippo R on: 09/08/2019 04:59 PM   Modules accepted: Orders

## 2019-09-08 NOTE — Assessment & Plan Note (Signed)
Question a component of restrictive lung disease and possibly associated deconditioning due to his gallbladder disease intra-abdominal abscess in 2020.  He does not have an elevated hemidiaphragm.  He did have right-sided pleural fluid on CT abdomen from October 2020.  Subsequent chest x-ray did not show significant residual effusion but we may decide to image again.  He needs full pulmonary function testing.  We will assess walking oximetry today.  If we do not establish an intrinsic pulmonary cause and I think he needs cardiac evaluation and cardiology referral.  If we can rule out both pulmonary and cardiac disease then we may be able to ascribe this to deconditioning.  We will perform full pulmonary function testing  Walking oximetry today on room air Follow with Dr. Lamonte Sakai next available with full pulmonary function testing on the same day.

## 2019-09-08 NOTE — Progress Notes (Signed)
Subjective:    Patient ID: Jonathon Parker, male    DOB: 10-11-1950, 69 y.o.   MRN: [id number]  HPI 69 year old never smoker with a history of CLL, allergic rhinitis, hypertension on lisinopril, GERD. He has been dealing with gallbladder disease during 2020, underwent ERCP and sphincterotomy, then percutaneous drainage.  He then underwent elective cholecystectomy [id number] that was complicated by an intra-abdominal abscess that required percutaneous drainage.   He is referred today for evaluation of dyspnea.  Never had PFT.  CXR 05/10/2019 reviewed by me, no abnormalities  He describes progressive exertional SOB over the last 6-9 months, was on oxygen at some point during the evaluation and treatment of his gallstones. He cannot walk up stairs without resting. He can walk pretty well on flat ground. Able to shop. He can hear wheeze occasionally, with exertion. His heart pounds w exertion. No CP. Describes difficulty taking a deep breath.    Review of Systems Past Medical History:  Diagnosis Date  . Cholecystitis   . 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 Karle Starch in [facility] hospital    . Depression   . GERD (gastroesophageal reflux disease)    controlled with nexium   . Hypertension      Family History  Problem Relation Age of Onset  . Colon cancer Neg Hx   . Esophageal cancer Neg Hx      Social History   Socioeconomic History  . Marital status: Married    Spouse name: Not on file  . Number of children: Not on file  . Years of education: Not on file  . Highest education level: Not on file  Occupational History  . Not on file  Tobacco Use  . Smoking status: Never Smoker  . Smokeless tobacco: Never Used  Substance and Sexual Activity  . Alcohol use: Yes    Comment: 1 drink before supper  . Drug use: No  . Sexual activity: Yes  Other Topics Concern  . Not on file  Social History Narrative  . Not on file   Social Determinants of  Health   Financial Resource Strain:   . Difficulty of Paying Living Expenses: Not on file  Food Insecurity:   . Worried About Charity fundraiser in the Last Year: Not on file  . Ran Out of Food in the Last Year: Not on file  Transportation Needs:   . Lack of Transportation (Medical): Not on file  . Lack of Transportation (Non-Medical): Not on file  Physical Activity:   . Days of Exercise per Week: Not on file  . Minutes of Exercise per Session: Not on file  Stress:   . Feeling of Stress : Not on file  Social Connections:   . Frequency of Communication with Friends and Family: Not on file  . Frequency of Social Gatherings with Friends and Family: Not on file  . Attends Religious Services: Not on file  . Active Member of Clubs or Organizations: Not on file  . Attends Archivist Meetings: Not on file  . Marital Status: Not on file  Intimate Partner Violence:   . Fear of Current or Ex-Partner: Not on file  . Emotionally Abused: Not on file  . Physically Abused: Not on file  . 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 acetaminophen (TYLENOL) 325 MG tablet Take 2 tablets (650 mg total) by mouth every 6 (six) hours as needed for mild pain (or temp > 100).    . ALPRAZolam (XANAX) 0.25 MG tablet Take 0.25 mg by mouth at bedtime as needed for anxiety.    Marland Kitchen buPROPion (WELLBUTRIN XL) 300 MG 24 hr tablet Take 300 mg by mouth daily.     Marland Kitchen docusate sodium (COLACE) 100 MG capsule Take 1 capsule (100 mg total) by mouth daily as needed for mild constipation. (Patient taking differently: Take 100 mg by mouth daily. ) 10 capsule 0  . esomeprazole (NEXIUM) 40 MG capsule Take 40 mg by mouth daily.     Marland Kitchen lisinopril (ZESTRIL) 20 MG tablet Take 20 mg by mouth daily.    . traZODone (DESYREL) 100 MG tablet Take 100 mg  by mouth at bedtime.    . triamcinolone cream (KENALOG) 0.1 % Apply 1 application topically daily as needed (rash).    Marland Kitchen amoxicillin-clavulanate (AUGMENTIN) 875-125 MG tablet Take 1 tablet by mouth every 12 (twelve) hours. 28 tablet 0  . oxyCODONE (OXY IR/ROXICODONE) 5 MG immediate release tablet Take 1 tablet (5 mg total) by mouth every 6 (six) hours as needed for severe pain. 15 tablet 0  . oxyCODONE-acetaminophen (PERCOCET/ROXICET) 5-325 MG tablet Take 2 tablets by mouth every 4 (four) hours. 30 tablet 0   No facility-administered medications prior to visit.        Objective:   Physical Exam  Vitals:   09/08/19 1608  BP: 132/78  Pulse: 64  Temp: (!) 97 F (36.1 C)  TempSrc: Temporal  SpO2: 96%  Weight: 199 lb 3.2 oz (90.4 kg)  Height: 6' (1.829 m)   Gen: Pleasant, well-nourished, in no distress,  normal affect  ENT: No lesions,  mouth clear, crowded posterior pharynx, some erythema without exudate  Neck: No JVD, no stridor  Lungs: No use of accessory muscles, no crackles or wheezing on normal respiration, no wheeze on forced expiration. Good excursion  Cardiovascular: RRR, heart sounds normal, no murmur or gallops, no peripheral edema  Abdomen: slightly protuberant, otherwise benign  Musculoskeletal: No deformities, no cyanosis or clubbing  Neuro: alert, awake, non focal  Skin: Warm, no lesions or rash      Assessment & Plan:  Dyspnea Question a component of restrictive lung disease and possibly associated deconditioning due to his gallbladder disease intra-abdominal abscess in 2020.  He does not have an elevated hemidiaphragm.  He did have right-sided pleural fluid on CT abdomen from October 2020.  Subsequent chest x-ray did not show significant residual effusion but we may decide to image again.  He needs full pulmonary function testing.  We will assess walking oximetry today.  If we do not establish an intrinsic pulmonary cause and I think he needs cardiac  evaluation and cardiology referral.  If we can rule out both pulmonary and cardiac disease then we may be able to ascribe this to deconditioning.  We will perform full pulmonary function testing  Walking oximetry today on room air Follow with Dr. Lamonte Sakai next available with full pulmonary function testing on the same day.   Baltazar Apo, MD, PhD 09/08/2019, 4:51 PM [facility] [phone] or if no answer [phone]

## 2019-09-29 ENCOUNTER — Ambulatory Visit: Payer: Self-pay

## 2019-10-07 DIAGNOSIS — D72820 Lymphocytosis (symptomatic): Secondary | ICD-10-CM | POA: Diagnosis not present

## 2019-10-07 DIAGNOSIS — C9110 Chronic lymphocytic leukemia of B-cell type not having achieved remission: Secondary | ICD-10-CM | POA: Diagnosis not present

## 2019-11-08 ENCOUNTER — Other Ambulatory Visit (HOSPITAL_COMMUNITY)
Admission: RE | Admit: 2019-11-08 | Discharge: 2019-11-08 | Disposition: A | Discharge status: Home / Self Care | Payer: Medicare PPO | Source: Ambulatory Visit | Attending: Emergency Medicine | Admitting: Emergency Medicine

## 2019-11-08 DIAGNOSIS — Z20822 Contact with and (suspected) exposure to covid-19: Secondary | ICD-10-CM | POA: Diagnosis not present

## 2019-11-08 DIAGNOSIS — Z01812 Encounter for preprocedural laboratory examination: Secondary | ICD-10-CM | POA: Diagnosis not present

## 2019-11-08 LAB — SARS CORONAVIRUS 2 (TAT 6-24 HRS): SARS Coronavirus 2: NEGATIVE

## 2019-11-11 ENCOUNTER — Other Ambulatory Visit: Payer: Self-pay

## 2019-11-11 ENCOUNTER — Ambulatory Visit: Payer: Medicare PPO | Admitting: Emergency Medicine

## 2019-11-11 DIAGNOSIS — R0602 Shortness of breath: Secondary | ICD-10-CM

## 2019-11-11 NOTE — Progress Notes (Signed)
PFT done today. 

## 2019-11-12 LAB — PULMONARY FUNCTION TEST
DL/VA % pred: 112 %
DL/VA: 4.52 ml/min/mmHg/L
DLCO cor % pred: 91 %
DLCO cor: 26.38 ml/min/mmHg
DLCO unc % pred: 91 %
DLCO unc: 26.38 ml/min/mmHg
FEF 25-75 Post: 2.92 L/sec
FEF 25-75 Pre: 1.65 L/sec
FEF2575-%Change-Post: 76 %
FEF2575-%Pred-Post: 102 %
FEF2575-%Pred-Pre: 57 %
FEV1-%Change-Post: 12 %
FEV1-%Pred-Post: 72 %
FEV1-%Pred-Pre: 64 %
FEV1-Post: 2.71 L
FEV1-Pre: 2.41 L
FEV1FVC-%Change-Post: 4 %
FEV1FVC-%Pred-Pre: 100 %
FEV6-%Change-Post: 8 %
FEV6-%Pred-Post: 73 %
FEV6-%Pred-Pre: 67 %
FEV6-Post: 3.5 L
FEV6-Pre: 3.23 L
FEV6FVC-%Change-Post: 0 %
FEV6FVC-%Pred-Post: 105 %
FEV6FVC-%Pred-Pre: 104 %
FVC-%Change-Post: 7 %
FVC-%Pred-Post: 69 %
FVC-%Pred-Pre: 64 %
FVC-Post: 3.5 L
FVC-Pre: 3.24 L
Post FEV1/FVC ratio: 78 %
Post FEV6/FVC ratio: 100 %
Pre FEV1/FVC ratio: 74 %
Pre FEV6/FVC Ratio: 100 %
RV % pred: 112 %
RV: 2.89 L
TLC % pred: 80 %
TLC: 6.13 L

## 2019-11-26 DIAGNOSIS — Z85828 Personal history of other malignant neoplasm of skin: Secondary | ICD-10-CM | POA: Diagnosis not present

## 2019-11-26 DIAGNOSIS — Z08 Encounter for follow-up examination after completed treatment for malignant neoplasm: Secondary | ICD-10-CM | POA: Diagnosis not present

## 2019-11-26 DIAGNOSIS — D225 Melanocytic nevi of trunk: Secondary | ICD-10-CM | POA: Diagnosis not present

## 2019-11-26 DIAGNOSIS — Z7189 Other specified counseling: Secondary | ICD-10-CM | POA: Diagnosis not present

## 2019-11-26 DIAGNOSIS — L853 Xerosis cutis: Secondary | ICD-10-CM | POA: Diagnosis not present

## 2019-11-26 DIAGNOSIS — D485 Neoplasm of uncertain behavior of skin: Secondary | ICD-10-CM | POA: Diagnosis not present

## 2019-11-26 DIAGNOSIS — L814 Other melanin hyperpigmentation: Secondary | ICD-10-CM | POA: Diagnosis not present

## 2019-11-26 DIAGNOSIS — C4441 Basal cell carcinoma of skin of scalp and neck: Secondary | ICD-10-CM | POA: Diagnosis not present

## 2019-11-26 DIAGNOSIS — L821 Other seborrheic keratosis: Secondary | ICD-10-CM | POA: Diagnosis not present

## 2019-11-26 DIAGNOSIS — L2084 Intrinsic (allergic) eczema: Secondary | ICD-10-CM | POA: Diagnosis not present

## 2019-12-02 ENCOUNTER — Encounter: Payer: Self-pay | Admitting: Emergency Medicine

## 2019-12-02 ENCOUNTER — Ambulatory Visit: Payer: Medicare PPO | Admitting: Emergency Medicine

## 2019-12-02 ENCOUNTER — Other Ambulatory Visit: Payer: Self-pay

## 2019-12-02 DIAGNOSIS — R0602 Shortness of breath: Secondary | ICD-10-CM

## 2019-12-02 MED ORDER — ALBUTEROL SULFATE HFA 108 (90 BASE) MCG/ACT IN AERS: 2.0000 | INHALATION_SPRAY | Freq: Four times a day (QID) | RESPIRATORY_TRACT | 5 refills | Status: DC | PRN

## 2019-12-02 NOTE — Progress Notes (Signed)
Subjective:    Patient ID: Jonathon Parker, male    DOB: 11-21-1950, 69 y.o.   MRN: [id number]  HPI 69 year old never smoker with a history of CLL, allergic rhinitis, hypertension on lisinopril, GERD. He has been dealing with gallbladder disease during 2020, underwent ERCP and sphincterotomy, then percutaneous drainage.  He then underwent elective cholecystectomy [id number] that was complicated by an intra-abdominal abscess that required percutaneous drainage.   He is referred today for evaluation of dyspnea.  Never had PFT.  CXR 05/10/2019 reviewed by me, no abnormalities  He describes progressive exertional SOB over the last 6-9 months, was on oxygen at some point during the evaluation and treatment of his gallstones. He cannot walk up stairs without resting. He can walk pretty well on flat ground. Able to shop. He can hear wheeze occasionally, with exertion. His heart pounds w exertion. No CP. Describes difficulty taking a deep breath.   ROV 12/02/19 --follow-up visit for 68 year old never smoker for evaluation of exertional shortness of breath.  Query restrictive lung disease based on his description.  He underwent full pulmonary function testing today which I have reviewed, shows spirometry consistent with mixed obstruction and restriction with a positive bronchodilator response, FEV1 64% predicted.  He has pseudonormalization of his lung volumes and normal diffusion capacity.   Review of Systems Past Medical History:  Diagnosis Date  . Cholecystitis   . 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 Karle Starch in [facility] hospital    . Depression   . GERD (gastroesophageal reflux disease)    controlled with nexium   . Hypertension      Family History  Problem Relation Age of Onset  . Colon cancer Neg Hx   . Esophageal cancer Neg Hx      Social History   Socioeconomic History  . Marital status: Married    Spouse name: Not on file  . Number of  children: Not on file  . Years of education: Not on file  . Highest education level: Not on file  Occupational History  . Not on file  Tobacco Use  . Smoking status: Never Smoker  . Smokeless tobacco: Never Used  Substance and Sexual Activity  . Alcohol use: Yes    Comment: 1 drink before supper  . Drug use: No  . Sexual activity: Yes  Other Topics Concern  . Not on file  Social History Narrative  . Not on file   Social Determinants of Health   Financial Resource Strain:   . Difficulty of Paying Living Expenses:   Food Insecurity:   . Worried About Charity fundraiser in the Last Year:   . Arboriculturist in the Last Year:   Transportation Needs:   . Film/video editor (Medical):   Marland Kitchen Lack of Transportation (Non-Medical):   Physical Activity:   . Days of Exercise per Week:   . Minutes of Exercise per Session:   Stress:   . Feeling of Stress :   Social Connections:   . Frequency of Communication with Friends and Family:   . Frequency of Social Gatherings with Friends and Family:   . Attends Religious Services:   . Active Member of Clubs or Organizations:   . Attends Archivist Meetings:   Marland Kitchen Marital Status:   Intimate Partner Violence:   . Fear of Current or Ex-Partner:   . Emotionally Abused:   Marland Kitchen Physically Abused:   . 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 acetaminophen (TYLENOL) 325 MG tablet Take 2 tablets (650 mg total) by mouth every 6 (six) hours as needed for mild pain (or temp > 100).    . ALPRAZolam (XANAX) 0.25 MG tablet Take 0.25 mg by mouth at bedtime as needed for anxiety.    Marland Kitchen buPROPion (WELLBUTRIN XL) 300 MG 24 hr tablet Take 300 mg by mouth daily.     Marland Kitchen docusate sodium (COLACE) 100 MG capsule Take 1 capsule (100 mg total) by mouth daily as needed for mild  constipation. (Patient taking differently: Take 100 mg by mouth daily. ) 10 capsule 0  . esomeprazole (NEXIUM) 40 MG capsule Take 40 mg by mouth daily.     Marland Kitchen lisinopril (ZESTRIL) 20 MG tablet Take 20 mg by mouth daily.    . traZODone (DESYREL) 100 MG tablet Take 100 mg by mouth at bedtime.    . triamcinolone cream (KENALOG) 0.1 % Apply 1 application topically daily as needed (rash).     No facility-administered medications prior to visit.        Objective:   Physical Exam  Vitals:   12/02/19 1437  BP: 130/80  Pulse: 70  Temp: (!) 97.2 F (36.2 C)  TempSrc: Temporal  SpO2: 95%  Weight: 201 lb 12.8 oz (91.5 kg)  Height: 6' (1.829 m)   Gen: Pleasant, well-nourished, in no distress,  normal affect  ENT: No lesions,  mouth clear, crowded posterior pharynx, some erythema without exudate  Neck: No JVD, no stridor  Lungs: No use of accessory muscles, no crackles or wheezing on normal respiration, no wheeze on forced expiration. Good excursion  Cardiovascular: RRR, heart sounds normal, no murmur or gallops, no peripheral edema  Abdomen: slightly protuberant, otherwise benign  Musculoskeletal: No deformities, no cyanosis or clubbing  Neuro: alert, awake, non focal  Skin: Warm, no lesions or rash      Assessment & Plan:  Dyspnea Pulmonary function testing consistent with restricted and obstructed spirometry with pseudonormalization of his lung volumes.  Good gas exchange.  Suspect some degree of restriction from his peridiaphragmatic surgeries although I do not see diaphragmatic paralysis on chest x-ray.  Some of it could also be his weight, physical conditioning.  I think it would be worthwhile to do a trial of albuterol to see if he gets benefit.  If he continues to be dyspneic despite this then I will refer him for cardiology evaluation to ensure no evidence for CAD limiting his functional capacity.  Your pulmonary function testing shows evidence for restriction on the size  of each breath as well as some airflow obstruction. We will try using albuterol 2 puffs up to every 4 hours to see if this helps with shortness of breath.  You can also try using it 10 to 15 minutes before exertion to see if this makes your exercise and breathing easier Continue to work on physical conditioning and weight loss.  This will be helpful also. Follow with Dr. Lamonte Sakai in 2 months or sooner if you have any problems  Baltazar Apo, MD, PhD 12/02/2019, 2:59 PM [facility] [phone] or if no answer [phone]

## 2019-12-02 NOTE — Patient Instructions (Signed)
Your pulmonary function testing shows evidence for restriction on the size of each breath as well as some airflow obstruction. We will try using albuterol 2 puffs up to every 4 hours to see if this helps with shortness of breath.  You can also try using it 10 to 15 minutes before exertion to see if this makes your exercise and breathing easier Continue to work on physical conditioning and weight loss.  This will be helpful also. Follow with Dr. Lamonte Sakai in 2 months or sooner if you have any problems.

## 2019-12-02 NOTE — Assessment & Plan Note (Signed)
Pulmonary function testing consistent with restricted and obstructed spirometry with pseudonormalization of his lung volumes.  Good gas exchange.  Suspect some degree of restriction from his peridiaphragmatic surgeries although I do not see diaphragmatic paralysis on chest x-ray.  Some of it could also be his weight, physical conditioning.  I think it would be worthwhile to do a trial of albuterol to see if he gets benefit.  If he continues to be dyspneic despite this then I will refer him for cardiology evaluation to ensure no evidence for CAD limiting his functional capacity.  Your pulmonary function testing shows evidence for restriction on the size of each breath as well as some airflow obstruction. We will try using albuterol 2 puffs up to every 4 hours to see if this helps with shortness of breath.  You can also try using it 10 to 15 minutes before exertion to see if this makes your exercise and breathing easier Continue to work on physical conditioning and weight loss.  This will be helpful also. Follow with Dr. Lamonte Sakai in 2 months or sooner if you have any problems

## 2019-12-02 NOTE — Addendum Note (Signed)
Addended by: Vanessa Barbara on: 12/02/2019 03:17 PM   Modules accepted: Orders

## 2019-12-31 DIAGNOSIS — C4441 Basal cell carcinoma of skin of scalp and neck: Secondary | ICD-10-CM | POA: Diagnosis not present

## 2020-01-18 DIAGNOSIS — Z1331 Encounter for screening for depression: Secondary | ICD-10-CM | POA: Diagnosis not present

## 2020-01-18 DIAGNOSIS — Z0000 Encounter for general adult medical examination without abnormal findings: Secondary | ICD-10-CM | POA: Diagnosis not present

## 2020-01-18 DIAGNOSIS — Z139 Encounter for screening, unspecified: Secondary | ICD-10-CM | POA: Diagnosis not present

## 2020-01-18 DIAGNOSIS — Z9181 History of falling: Secondary | ICD-10-CM | POA: Diagnosis not present

## 2020-01-18 DIAGNOSIS — E785 Hyperlipidemia, unspecified: Secondary | ICD-10-CM | POA: Diagnosis not present

## 2020-01-28 ENCOUNTER — Encounter: Payer: Self-pay | Admitting: Emergency Medicine

## 2020-01-28 ENCOUNTER — Other Ambulatory Visit: Payer: Self-pay

## 2020-01-28 ENCOUNTER — Ambulatory Visit: Payer: Medicare PPO | Admitting: Emergency Medicine

## 2020-01-28 DIAGNOSIS — J4520 Mild intermittent asthma, uncomplicated: Secondary | ICD-10-CM | POA: Diagnosis not present

## 2020-01-28 MED ORDER — BUDESONIDE-FORMOTEROL FUMARATE 80-4.5 MCG/ACT IN AERO: 2.0000 | INHALATION_SPRAY | Freq: Two times a day (BID) | RESPIRATORY_TRACT | 5 refills | Status: DC

## 2020-01-28 NOTE — Assessment & Plan Note (Signed)
He did get clinical benefit from albuterol, was using it 1 or 2 times daily.  Based on this I think we should do a trial of scheduled bronchodilator therapy, will start Symbicort 80/4.5  We will start Symbicort 80/4.36mcg 2 puffs twice a day.  Rinse and gargle after using. Keep your albuterol available to use 2 puffs when you need it for shortness of breath, chest tightness, wheezing. Continue to work on your exercise and conditioning. Follow with Dr Lamonte Sakai in 6 months or sooner if you have any problems

## 2020-01-28 NOTE — Progress Notes (Signed)
   Subjective:    Patient ID: Jonathon Parker, male    DOB: [birth year], 69 y.o.   MRN: [id number]  HPI 69 year old never smoker with a history of CLL, allergic rhinitis, hypertension on lisinopril, GERD. He has been dealing with gallbladder disease during 2020, underwent ERCP and sphincterotomy, then percutaneous drainage.  He then underwent elective cholecystectomy 11/11/8117 that was complicated by an intra-abdominal abscess that required percutaneous drainage.   He is referred today for evaluation of dyspnea.  Never had PFT.  CXR 05/10/2019 reviewed by me, no abnormalities  He describes progressive exertional SOB over the last 6-9 months, was on oxygen at some point during the evaluation and treatment of his gallstones. He cannot walk up stairs without resting. He can walk pretty well on flat ground. Able to shop. He can hear wheeze occasionally, with exertion. His heart pounds w exertion. No CP. Describes difficulty taking a deep breath.   ROV 12/02/19 --follow-up visit for 69 year old never smoker for evaluation of exertional shortness of breath.  Query restrictive lung disease based on his description.  He underwent full pulmonary function testing today which I have reviewed, shows spirometry consistent with mixed obstruction and restriction with a positive bronchodilator response, FEV1 64% predicted.  He has pseudonormalization of his lung volumes and normal diffusion capacity.   ROV 01/28/20 --this is a follow-up visit for 69 year old gentleman whom we have evaluated for dyspnea.  Pulmonary function testing have shown mixed obstruction with restriction with positive BD response, pseudonormalized volumes and normal gas exchange.  Based on this we decided to undertake a trial of albuterol to see if you get benefit.  Today he reports that he did try the albuterol, used it some in the am.    Review of Systems as per HPI     Objective:   Physical Exam  Vitals:   01/28/20 1333  BP:  140/80  Pulse: (!) 58  Temp: 98.2 F (36.8 C)  TempSrc: Oral  SpO2: 97%  Weight: 202 lb 12.8 oz (92 kg)  Height: 6' (1.829 m)   Gen: Pleasant, well-nourished, in no distress,  normal affect  ENT: No lesions,  mouth clear, crowded posterior pharynx, clear  Neck: No JVD, no stridor  Lungs: No use of accessory muscles, no crackles or wheezing on normal respiration, no wheeze on forced expiration. Good excursion  Cardiovascular: RRR, heart sounds normal, no murmur or gallops, no peripheral edema  Musculoskeletal: No deformities, no cyanosis or clubbing  Neuro: alert, awake, non focal  Skin: Warm, no lesions or rash      Assessment & Plan:  Mild intermittent asthma He did get clinical benefit from albuterol, was using it 1 or 2 times daily.  Based on this I think we should do a trial of scheduled bronchodilator therapy, will start Symbicort 80/4.5  We will start Symbicort 80/4.69mcg 2 puffs twice a day.  Rinse and gargle after using. Keep your albuterol available to use 2 puffs when you need it for shortness of breath, chest tightness, wheezing. Continue to work on your exercise and conditioning. Follow with Dr Lamonte Sakai in 6 months or sooner if you have any problems  Baltazar Apo, MD, PhD 01/28/2020, 1:49 PM [facility] [phone] or if no answer [phone]

## 2020-01-28 NOTE — Patient Instructions (Signed)
We will start Symbicort 80/4.73mcg 2 puffs twice a day.  Rinse and gargle after using. Keep your albuterol available to use 2 puffs when you need it for shortness of breath, chest tightness, wheezing. Continue to work on your exercise and conditioning. Follow with Dr Lamonte Sakai in 6 months or sooner if you have any problems

## 2020-02-27 ENCOUNTER — Other Ambulatory Visit: Payer: Self-pay

## 2020-02-27 ENCOUNTER — Emergency Department (HOSPITAL_COMMUNITY)
Admission: EM | Admit: 2020-02-27 | Discharge: 2020-02-27 | Disposition: A | Discharge status: Home / Self Care | Payer: Medicare PPO | Attending: Emergency Medicine | Admitting: Emergency Medicine

## 2020-02-27 ENCOUNTER — Encounter (HOSPITAL_COMMUNITY): Payer: Self-pay | Admitting: Emergency Medicine

## 2020-02-27 DIAGNOSIS — I10 Essential (primary) hypertension: Secondary | ICD-10-CM | POA: Diagnosis not present

## 2020-02-27 DIAGNOSIS — Z79899 Other long term (current) drug therapy: Secondary | ICD-10-CM | POA: Diagnosis not present

## 2020-02-27 DIAGNOSIS — K5900 Constipation, unspecified: Secondary | ICD-10-CM | POA: Diagnosis not present

## 2020-02-27 MED ORDER — DOCUSATE SODIUM 100 MG PO CAPS: 100.0000 mg | ORAL_CAPSULE | Freq: Two times a day (BID) | ORAL | 0 refills | Status: DC

## 2020-02-27 MED ORDER — MILK AND MOLASSES ENEMA
1.0000 | Freq: Once | RECTAL | Status: AC
  Administered 2020-02-27: 240 mL via RECTAL
  Filled 2020-02-27: qty 240

## 2020-02-27 MED ORDER — POLYETHYLENE GLYCOL 3350 17 GM/SCOOP PO POWD: ORAL | 0 refills | Status: DC

## 2020-02-27 NOTE — ED Notes (Signed)
Fluids given.

## 2020-02-27 NOTE — ED Triage Notes (Signed)
PT presents to ED POV. Pt c/o constipation x4d. Pt reports bloating. Pt is able to eat and drink normally. Tried OTC laxative w/ no relief

## 2020-02-27 NOTE — ED Provider Notes (Signed)
[facility] EMERGENCY DEPARTMENT Provider Note   CSN: [id number] Arrival date & time: 02/27/20  0004     History Chief Complaint  Patient presents with  . Constipation    Jonathon Parker is a 69 y.o. male.  69 year old male with past medical history below including CLL, hypertension who presents with constipation.  Patient states that he has chronic issues with constipation and often takes Colace.  Last weekend, the patient had profuse diarrhea for a day or 2 and since then he has not had a bowel movement for the past 4 to 5 days. He did not restart colace after diarrhea episode.  He reports progressively worsening bloating and stomach fullness although he denies any actual abdominal pain.  He tried taking a suppository and 8oz magnesium citrate without relief. He reports rectal pain, feeling like he needs to pass stool but cannot. Denies any rectal bleeding.   The history is provided by the patient.  Constipation      Past Medical History:  Diagnosis Date  . Cholecystitis   . 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 Karle Starch in [facility] hospital    . Depression   . GERD (gastroesophageal reflux disease)    controlled with nexium   . Hypertension     Patient Active Problem List   Diagnosis Date Noted  . Mild intermittent asthma 01/28/2020  . Dyspnea 05/11/2019  . Thrombocytosis ([place]) 05/11/2019  . Alcohol dependence ([place]) 05/11/2019  . Abnormal findings on diagnostic imaging of gall bladder 05/10/2019  . Essential hypertension 04/22/2019  . Post-operative complication 57/84/6962  . Acute respiratory failure with hypoxia ([place]) 04/21/2019  . SIRS (systemic inflammatory response syndrome) ([place]) 04/21/2019  . Status post laparoscopic cholecystectomy 04/21/2019  . HCAP (healthcare-associated pneumonia) 04/21/2019  . CLL (chronic lymphocytic leukemia) ([place]) 04/21/2019  . Calculous cholecystitis 02/28/2019  . Campylobacter  diarrhea   . Diarrhea of infectious origin   . STEC (Shiga toxin-producing Escherichia coli)   . Elevated LFTs   . Hypokalemia   . Gastroenteritis 01/19/2017  . Sepsis ([place]) 01/19/2017  . GERD (gastroesophageal reflux disease) 01/19/2017  . Depression 01/19/2017    Past Surgical History:  Procedure Laterality Date  . APPENDECTOMY    . CARPAL TUNNEL RELEASE     left   . CHOLECYSTECTOMY N/A 04/20/2019   Procedure: LAPAROSCOPIC CHOLECYSTECTOMY;  Surgeon: Greer Pickerel, MD;  Location: [facility];  Service: General;  Laterality: N/A;  . COLON SURGERY  2002   10 inches of colon taken out   . COLONOSCOPY    . ENDOSCOPIC RETROGRADE CHOLANGIOPANCREATOGRAPHY (ERCP) WITH PROPOFOL N/A 03/01/2019   Procedure: ENDOSCOPIC RETROGRADE CHOLANGIOPANCREATOGRAPHY (ERCP) WITH PROPOFOL;  Surgeon: Rush Landmark Telford Nab., MD;  Location: [facility];  Service: Gastroenterology;  Laterality: N/A;  . ERCP  03/01/2019  . HERNIA REPAIR     bilateral inguinal   . IR CHOLANGIOGRAM EXISTING TUBE  03/25/2019  . IR PERC CHOLECYSTOSTOMY  03/02/2019  . REMOVAL OF STONES  03/01/2019   Procedure: REMOVAL OF STONES;  Surgeon: Rush Landmark Telford Nab., MD;  Location: [facility];  Service: Gastroenterology;;  . [name]  03/01/2019   Procedure: [name];  Surgeon: [name], MD;  Location: [facility] ENDOSCOPY;  Service: Gastroenterology;;       Family History  Problem Relation Age of Onset  . Colon cancer Neg Hx   . Esophageal cancer Neg Hx     Social History   Tobacco Use  . Smoking status: Never Smoker  .  Smokeless tobacco: Never Used  Vaping Use  . Vaping Use: Never used  Substance Use Topics  . Alcohol use: Yes    Comment: 1 drink before supper  . Drug use: No    Home Medications Prior to Admission medications   Medication Sig Start Date End Date Taking? Authorizing Provider  ALPRAZolam Duanne Moron) 0.25 MG tablet Take 0.25 mg by mouth at bedtime as needed for anxiety.   Yes [provider]  budesonide-formoterol (SYMBICORT) 80-4.5 MCG/ACT inhaler Inhale 2 puffs into the lungs in the morning and at bedtime. 01/28/20  Yes Collene Gobble, MD  buPROPion (WELLBUTRIN XL) 300 MG 24 hr tablet Take 300 mg by mouth daily.    Yes [provider]  esomeprazole (NEXIUM) 40 MG capsule Take 40 mg by mouth daily.    Yes [provider]  ibuprofen (ADVIL) 400 MG tablet Take 400 mg by mouth every 6 (six) hours as needed for fever, headache or moderate pain.   Yes [provider]  lisinopril (ZESTRIL) 20 MG tablet Take 20 mg by mouth at bedtime.    Yes [provider]  traZODone (DESYREL) 100 MG tablet Take 100 mg by mouth at bedtime.   Yes [provider]  acetaminophen (TYLENOL) 325 MG tablet Take 2 tablets (650 mg total) by mouth every 6 (six) hours as needed for mild pain (or temp > 100). Patient not taking: Reported on 02/27/2020 03/07/19   Earnstine Regal, PA-C  albuterol (VENTOLIN HFA) 108 (90 Base) MCG/ACT inhaler Inhale 2 puffs into the lungs every 6 (six) hours as needed for wheezing or shortness of breath. Patient not taking: Reported on 02/27/2020 12/02/19   Collene Gobble, MD  docusate sodium (COLACE) 100 MG capsule Take 1 capsule (100 mg total) by mouth 2 (two) times daily. 02/27/20   Kanaya Gunnarson, Wenda Overland, MD  polyethylene glycol powder [facility]) 17 GM/SCOOP powder Take 1 capful of powder mixed in drink by mouth 1 to 3 times daily Until daily soft stools  OTC 02/27/20   Keddrick Wyne, Wenda Overland, MD    Allergies    Patient has no known allergies.  Review of Systems   Review of Systems  Gastrointestinal: Positive for constipation.   All other systems reviewed and are negative except that which was mentioned in HPI  Physical Exam Updated Vital Signs BP (!) 136/96 (BP Location: Right Arm)   Pulse 90   Temp 99.2 F (37.3 C) (Oral)   Resp 18   Ht 6' (1.829 m)   Wt 90.7 kg   SpO2 96%   BMI 27.12 kg/m   Physical  Exam Vitals and nursing note reviewed.  Constitutional:      General: He is not in acute distress.    Appearance: He is well-developed.  HENT:     Head: Normocephalic and atraumatic.  Eyes:     Conjunctiva/sclera: Conjunctivae normal.  Cardiovascular:     Rate and Rhythm: Normal rate and regular rhythm.     Heart sounds: Normal heart sounds. No murmur heard.   Pulmonary:     Effort: Pulmonary effort is normal.     Breath sounds: Normal breath sounds.  Abdominal:     General: Bowel sounds are normal. There is distension (moderate).     Palpations: Abdomen is soft.     Tenderness: There is no abdominal tenderness.  Musculoskeletal:     Cervical back: Neck supple.  Skin:    General: Skin is warm and dry.  Neurological:  Mental Status: He is alert and oriented to person, place, and time.     Comments: Fluent speech  Psychiatric:        Judgment: Judgment normal.     ED Results / Procedures / Treatments   Labs (all labs ordered are listed, but only abnormal results are displayed) Labs Reviewed - No data to display  EKG None  Radiology No results found.  Procedures Procedures (including critical care time)  Medications Ordered in ED Medications  milk and molasses enema (240 mLs Rectal Given 02/27/20 1303)    ED Course  I have reviewed the triage vital signs and the nursing notes.  Pertinent labs & imaging results that were available during my care of the patient were reviewed by me and considered in my medical decision making (see chart for details).    MDM Rules/Calculators/A&P                          Moderately distended on exam but no focal abd pain. Gave enema and pt had large BM in ED. He reports significant improvement and states he feels so much better that he's ready to go home. Denies abd pain. Recommended colace BID, miralax daily w/ titration to effect, good hydration. Reviewed return precautions.  Final Clinical Impression(s) / ED Diagnoses Final  diagnoses:  Constipation, unspecified constipation type    Rx / DC Orders ED Discharge Orders         Ordered    docusate sodium (COLACE) 100 MG capsule  2 times daily     Discontinue  Reprint     02/27/20 1422    polyethylene glycol powder (GLYCOLAX/MIRALAX) 17 GM/SCOOP powder     Discontinue  Reprint     02/27/20 [place], Wenda Overland, MD 02/27/20 [phone]

## 2020-02-28 DIAGNOSIS — F331 Major depressive disorder, recurrent, moderate: Secondary | ICD-10-CM | POA: Diagnosis not present

## 2020-02-28 DIAGNOSIS — J4520 Mild intermittent asthma, uncomplicated: Secondary | ICD-10-CM | POA: Diagnosis not present

## 2020-02-28 DIAGNOSIS — E291 Testicular hypofunction: Secondary | ICD-10-CM | POA: Diagnosis not present

## 2020-02-28 DIAGNOSIS — I10 Essential (primary) hypertension: Secondary | ICD-10-CM | POA: Diagnosis not present

## 2020-02-28 DIAGNOSIS — F5104 Psychophysiologic insomnia: Secondary | ICD-10-CM | POA: Diagnosis not present

## 2020-02-28 DIAGNOSIS — Z125 Encounter for screening for malignant neoplasm of prostate: Secondary | ICD-10-CM | POA: Diagnosis not present

## 2020-02-28 DIAGNOSIS — K219 Gastro-esophageal reflux disease without esophagitis: Secondary | ICD-10-CM | POA: Diagnosis not present

## 2020-03-06 DIAGNOSIS — R413 Other amnesia: Secondary | ICD-10-CM | POA: Diagnosis not present

## 2020-03-06 DIAGNOSIS — E538 Deficiency of other specified B group vitamins: Secondary | ICD-10-CM | POA: Diagnosis not present

## 2020-03-22 DIAGNOSIS — D72820 Lymphocytosis (symptomatic): Secondary | ICD-10-CM | POA: Diagnosis not present

## 2020-03-22 DIAGNOSIS — C9110 Chronic lymphocytic leukemia of B-cell type not having achieved remission: Secondary | ICD-10-CM | POA: Diagnosis not present

## 2020-04-03 DIAGNOSIS — F0390 Unspecified dementia without behavioral disturbance: Secondary | ICD-10-CM | POA: Diagnosis not present

## 2020-04-03 DIAGNOSIS — R413 Other amnesia: Secondary | ICD-10-CM | POA: Diagnosis not present

## 2020-04-03 DIAGNOSIS — G319 Degenerative disease of nervous system, unspecified: Secondary | ICD-10-CM | POA: Diagnosis not present

## 2020-04-27 ENCOUNTER — Encounter: Payer: Self-pay | Admitting: Gastroenterology

## 2020-04-28 ENCOUNTER — Encounter: Payer: Self-pay | Admitting: Gastroenterology

## 2020-05-05 ENCOUNTER — Encounter: Payer: Self-pay | Admitting: Neurology

## 2020-06-12 DIAGNOSIS — L2084 Intrinsic (allergic) eczema: Secondary | ICD-10-CM | POA: Diagnosis not present

## 2020-06-12 DIAGNOSIS — L570 Actinic keratosis: Secondary | ICD-10-CM | POA: Diagnosis not present

## 2020-06-12 DIAGNOSIS — Z7189 Other specified counseling: Secondary | ICD-10-CM | POA: Diagnosis not present

## 2020-06-12 DIAGNOSIS — L814 Other melanin hyperpigmentation: Secondary | ICD-10-CM | POA: Diagnosis not present

## 2020-06-12 DIAGNOSIS — D225 Melanocytic nevi of trunk: Secondary | ICD-10-CM | POA: Diagnosis not present

## 2020-06-12 DIAGNOSIS — Z85828 Personal history of other malignant neoplasm of skin: Secondary | ICD-10-CM | POA: Diagnosis not present

## 2020-06-12 DIAGNOSIS — L821 Other seborrheic keratosis: Secondary | ICD-10-CM | POA: Diagnosis not present

## 2020-06-12 DIAGNOSIS — X32XXXA Exposure to sunlight, initial encounter: Secondary | ICD-10-CM | POA: Diagnosis not present

## 2020-06-12 DIAGNOSIS — Z08 Encounter for follow-up examination after completed treatment for malignant neoplasm: Secondary | ICD-10-CM | POA: Diagnosis not present

## 2020-06-13 ENCOUNTER — Other Ambulatory Visit: Payer: Self-pay | Admitting: Hematology and Oncology

## 2020-06-13 DIAGNOSIS — C9110 Chronic lymphocytic leukemia of B-cell type not having achieved remission: Secondary | ICD-10-CM

## 2020-06-21 ENCOUNTER — Ambulatory Visit (AMBULATORY_SURGERY_CENTER): Payer: Self-pay | Admitting: *Deleted

## 2020-06-21 ENCOUNTER — Other Ambulatory Visit: Payer: Self-pay | Admitting: Oncology

## 2020-06-21 ENCOUNTER — Other Ambulatory Visit: Payer: Self-pay

## 2020-06-21 ENCOUNTER — Encounter: Payer: Self-pay | Admitting: Gastroenterology

## 2020-06-21 VITALS — Ht 72.0 in | Wt 199.0 lb

## 2020-06-21 DIAGNOSIS — C9110 Chronic lymphocytic leukemia of B-cell type not having achieved remission: Secondary | ICD-10-CM

## 2020-06-21 DIAGNOSIS — Z86010 Personal history of colonic polyps: Secondary | ICD-10-CM

## 2020-06-21 MED ORDER — SUPREP BOWEL PREP KIT 17.5-3.13-1.6 GM/177ML PO SOLN: 1.0000 | Freq: Once | ORAL | 0 refills | Status: AC

## 2020-06-21 NOTE — Progress Notes (Signed)
[facility]  [address] [place],  [place]  [address] [phone]  Clinic Day:  06/22/2020  Referring physician: Nicoletta Dress, MD   This document serves as a record of services personally performed by Hosie Poisson, MD. It was created on their behalf by [facility], a trained medical scribe. The creation of this record is based on the scribe's personal observations and the provider's statements to them.   CHIEF COMPLAINT:  CC: Chronic lymphocytic leukemia  Current Treatment:  Surveillance   HISTORY OF PRESENT ILLNESS:  Jonathon Parker is a 69 y.o. male with chronic lymphocytic leukemia diagnosed in October 2019. He was referred for leukocytosis/lymphocytosis.  His only complaint was fatigue, as well as intermittent adenopathy of his neck.  There was no anemia, thrombocytopenia or splenomegaly.  The white count was 14,000 with 22% neutrophils and 74% lymphocytes.  Hemoglobin was normal at 14.1 and platelet count was 158,000.  Flow cytometry, which confirmed the diagnosis of chronic lymphocytic leukemia.  In January 2020, the white count had jumped from 17 to 45.6 with 90% lymphocytes, but the hemoglobin and platelet count remained normal.  His absolute lymphocyte count had increased from 14,000 to 38,000. This was felt to be due to acute bronchitis he had at the time and he had no evidence of adenopathy or splenomegaly.  He was seen again in March and the white count had improved to 26.3, but still 90% lymphocytes. By June of 2020, his white count was down to 15.6 with 56% lymphocytes.  In October, his white count improved to 10.5 with 65.5% lymphocytes.  He underwent a cholecystectomy on September 15th.  Post op he ended up with an abscess and required drains.  His white count was back up to 22.8 when we saw him in August 2021.   INTERVAL HISTORY:  Jonathon Parker is here for routine follow up and states that he has been well.  He has had  trouble with his memory and MRI head did reveal advanced atrophy for his age.  He is scheduled with a neurologist in December.  Otherwise, he denies complaints.  His white count has mildly increased from 22.8 to 25.7 with an ALC of 2159, and his hemoglobin and platelets are normal.  Chemistries are unremarkable.  His  appetite is good, and he has lost 2 pounds since his last visit.  He denies fever, chills or other signs of infection.  He denies nausea, vomiting, bowel issues, or abdominal pain.  He denies sore throat, cough, dyspnea, or chest pain.  He has received the COVID vaccine booster.   REVIEW OF SYSTEMS:  Review of Systems  All other systems reviewed and are negative.    VITALS:  There were no vitals taken for this visit.  Wt Readings from Last 3 Encounters:  06/21/20 199 lb (90.3 kg)  02/27/20 200 lb (90.7 kg)  01/28/20 202 lb 12.8 oz (92 kg)    There is no height or weight on file to calculate BMI.  Performance status (ECOG): 1 - Symptomatic but completely ambulatory  PHYSICAL EXAM:  Physical Exam Constitutional:      General: He is not in acute distress.    Appearance: Normal appearance. He is normal weight.  HENT:     Head: Normocephalic and atraumatic.  Eyes:     General: No scleral icterus.    Extraocular Movements: Extraocular movements intact.     Conjunctiva/sclera: Conjunctivae normal.     Pupils: Pupils are equal, round,  and reactive to light.  Cardiovascular:     Rate and Rhythm: Normal rate and regular rhythm.     Pulses: Normal pulses.     Heart sounds: Normal heart sounds. No murmur heard.  No friction rub. No gallop.   Pulmonary:     Effort: Pulmonary effort is normal. No respiratory distress.     Breath sounds: Normal breath sounds.  Abdominal:     General: Bowel sounds are normal. There is no distension.     Palpations: Abdomen is soft. There is no mass.     Tenderness: There is no abdominal tenderness.     Comments: There is a 2 cm soft node in  the right inguinal area and a 1 cm in the left   Musculoskeletal:        General: Normal range of motion.     Cervical back: Normal range of motion and neck supple.     Right lower leg: No edema.     Left lower leg: No edema.  Lymphadenopathy:     Cervical: Cervical adenopathy (Right anterior neck has a 1-2 cm node in the area of the sumandibular gland. There is a 1 cm node in the right lateral neck.) present.  Skin:    General: Skin is warm and dry.  Neurological:     General: No focal deficit present.     Mental Status: He is alert and oriented to person, place, and time. Mental status is at baseline.  Psychiatric:        Mood and Affect: Mood normal.        Behavior: Behavior normal.        Thought Content: Thought content normal.        Judgment: Judgment normal.     LABS:   CBC Latest Ref Rng & Units 05/13/2019 05/12/2019 05/11/2019  WBC 4.0 - 10.5 K/uL 23.2(H) 23.8(H) 26.8(H)  Hemoglobin 13.0 - 17.0 g/dL 10.8(L) 11.4(L) 11.0(L)  Hematocrit 39 - 52 % 35.5(L) 36.3(L) 36.4(L)  Platelets 150 - 400 K/uL 426(H) 434(H) 484(H)   CMP Latest Ref Rng & Units 05/13/2019 05/12/2019 05/11/2019  Glucose 70 - 99 mg/dL 95 100(H) 93  BUN 8 - 23 mg/dL 11 13 13   Creatinine 0.61 - 1.24 mg/dL 0.89 0.91 1.00  Sodium 135 - 145 mmol/L 139 136 139  Potassium 3.5 - 5.1 mmol/L 3.7 3.6 3.7  Chloride 98 - 111 mmol/L 103 101 101  CO2 22 - 32 mmol/L 23 30 25   Calcium 8.9 - 10.3 mg/dL 8.6(L) 8.7(L) 9.0  Total Protein 6.5 - 8.1 g/dL - 6.6 6.7  Total Bilirubin 0.3 - 1.2 mg/dL - 0.5 0.8  Alkaline Phos 38 - 126 U/L - 267(H) 325(H)  AST 15 - 41 U/L - 16 19  ALT 0 - 44 U/L - 38 55(H)    No results found for: LDH   STUDIES:   He underwent an MRI head without contrast on 04/03/2020 showing: 1.  No acute intracranial abnormality. 2.  Advanced atrophy for age.  No specific lobar predilection.  Quantitative, volumetric MRI of the brain may be helpful for more specific evluation for characteristic atrophy  patterns associated with dementia.    Allergies: No Known Allergies  Current Medications: Current Outpatient Medications  Medication Sig Dispense Refill  . albuterol (VENTOLIN HFA) 108 (90 Base) MCG/ACT inhaler Inhale 2 puffs into the lungs every 6 (six) hours as needed for wheezing or shortness of breath. 18 g 5  . ALPRAZolam (XANAX) 0.25 MG  tablet Take 0.25 mg by mouth at bedtime as needed for anxiety.    . budesonide-formoterol (SYMBICORT) 80-4.5 MCG/ACT inhaler Inhale 2 puffs into the lungs in the morning and at bedtime. 1 Inhaler 5  . buPROPion (WELLBUTRIN XL) 300 MG 24 hr tablet Take 300 mg by mouth daily.     Marland Kitchen docusate sodium (COLACE) 100 MG capsule Take 1 capsule (100 mg total) by mouth 2 (two) times daily. 60 capsule 0  . donepezil (ARICEPT) 5 MG tablet Take 5 mg by mouth daily.    Marland Kitchen esomeprazole (NEXIUM) 40 MG capsule Take 40 mg by mouth daily.     Marland Kitchen ibuprofen (ADVIL) 400 MG tablet Take 400 mg by mouth every 6 (six) hours as needed for fever, headache or moderate pain.    Marland Kitchen lisinopril (ZESTRIL) 20 MG tablet Take 20 mg by mouth at bedtime.     . polyethylene glycol powder (GLYCOLAX/MIRALAX) 17 GM/SCOOP powder Take 1 capful of powder mixed in drink by mouth 1 to 3 times daily Until daily soft stools  OTC (Patient not taking: Reported on 06/21/2020) 225 g 0  . sertraline (ZOLOFT) 50 MG tablet Take 50 mg by mouth daily.    . traZODone (DESYREL) 100 MG tablet Take 100 mg by mouth at bedtime.     No current facility-administered medications for this visit.     ASSESSMENT & PLAN:   Assessment:   1.  Chronic lymphocytic leukemia, diagnosed in October 2019.  His leukocytosis/lymphocytosis is slowly increasing, and he does not have significant lymphadenopathy, so we will continue observation.    2.  Worsening memory.  MRI head revealed advanced atrophy for age, but no specific lobar predilection.  Quantitative, volumetric MRI of the brain may be helpful for more specific evluation for  characteristic atrophy patterns associated with dementia.     Plan: We will plan to see him back in 3 months with a CBC for repeat evaluation.  The patient and his wife understand the plans discussed today and are in agreement with them.  They know to contact our office if he develops concerns prior to his next appointment.   I provided 15 minutes of face-to-face time during this this encounter and > 50% was spent counseling as documented under my assessment and plan.    Derwood Kaplan, MD [facility] [address] [place] [address] Dept: [phone] Dept Fax: [phone]   I, Rita Ohara, am acting as scribe for Derwood Kaplan, MD  I have reviewed this report as typed by the medical scribe, and it is complete and accurate.  Hermina Barters

## 2020-06-21 NOTE — Progress Notes (Signed)
No egg or soy allergy known to patient  No issues with past sedation with any surgeries or procedures no intubation problems in the past  No FH of Malignant Hyperthermia No diet pills per patient No home 02 use per patient  No blood thinners per patient  Pt denies issues with constipation -uses Colace  Twice a day to daily- will have to have a BM and cant make it in time -sometimes has hard stools  No A fib or A flutter  EMMI video to pt or via Cataract 19 guidelines implemented in [place] today with Pt and RN   Fully vax'd - wife in [place] with pt today   2 day Suprep due to fair prep last colon   Due to the COVID-19 pandemic we are asking patients to follow these guidelines. Please only bring one care partner. Please be aware that your care partner may wait in the car in the parking lot or if they feel like they will be too hot to wait in the car, they may wait in the lobby on the 4th floor. All care partners are required to wear a mask the entire time (we do not have any that we can provide them), they need to practice social distancing, and we will do a Covid check for all patient's and care partners when you arrive. Also we will check their temperature and your temperature. If the care partner waits in their car they need to stay in the parking lot the entire time and we will call them on their cell phone when the patient is ready for discharge so they can bring the car to the front of the building. Also all patient's will need to wear a mask into building.

## 2020-06-22 ENCOUNTER — Encounter: Payer: Self-pay | Admitting: Oncology

## 2020-06-22 ENCOUNTER — Other Ambulatory Visit: Payer: Self-pay | Admitting: Oncology

## 2020-06-22 ENCOUNTER — Inpatient Hospital Stay: Payer: Medicare PPO | Attending: Oncology

## 2020-06-22 ENCOUNTER — Other Ambulatory Visit: Payer: Self-pay | Admitting: Hematology and Oncology

## 2020-06-22 ENCOUNTER — Inpatient Hospital Stay (INDEPENDENT_AMBULATORY_CARE_PROVIDER_SITE_OTHER): Payer: Medicare PPO | Admitting: Oncology

## 2020-06-22 ENCOUNTER — Telehealth: Payer: Self-pay | Admitting: Oncology

## 2020-06-22 VITALS — BP 154/86 | HR 61 | Temp 98.4°F | Resp 18 | Ht 72.0 in | Wt 198.1 lb

## 2020-06-22 DIAGNOSIS — C9110 Chronic lymphocytic leukemia of B-cell type not having achieved remission: Secondary | ICD-10-CM

## 2020-06-22 DIAGNOSIS — Z0001 Encounter for general adult medical examination with abnormal findings: Secondary | ICD-10-CM | POA: Diagnosis not present

## 2020-06-22 LAB — BASIC METABOLIC PANEL
BUN: 16 (ref 4–21)
CO2: 26 — AB (ref 13–22)
Chloride: 102 (ref 99–108)
Creatinine: 1.2 (ref 0.6–1.3)
Glucose: 104
Potassium: 4.4 (ref 3.4–5.3)
Sodium: 140 (ref 137–147)

## 2020-06-22 LAB — HEPATIC FUNCTION PANEL
ALT: 17 (ref 10–40)
AST: 28 (ref 14–40)
Alkaline Phosphatase: 78 (ref 25–125)
Bilirubin, Total: 0.8

## 2020-06-22 LAB — COMPREHENSIVE METABOLIC PANEL
Albumin: 4.1 (ref 3.5–5.0)
Calcium: 9.3 (ref 8.7–10.7)

## 2020-06-22 LAB — CBC AND DIFFERENTIAL
HCT: 47 (ref 41–53)
Hemoglobin: 15.7 (ref 13.5–17.5)
Neutrophils Absolute: 22.36
Platelets: 188 (ref 150–399)
WBC: 25.7

## 2020-06-22 LAB — CBC: RBC: 4.67 (ref 3.87–5.11)

## 2020-06-22 NOTE — Telephone Encounter (Signed)
Per 11/18 los next appt given to patient

## 2020-06-26 DIAGNOSIS — H25813 Combined forms of age-related cataract, bilateral: Secondary | ICD-10-CM | POA: Diagnosis not present

## 2020-07-05 ENCOUNTER — Ambulatory Visit (AMBULATORY_SURGERY_CENTER): Payer: Medicare PPO | Admitting: Gastroenterology

## 2020-07-05 ENCOUNTER — Encounter: Payer: Self-pay | Admitting: Gastroenterology

## 2020-07-05 ENCOUNTER — Other Ambulatory Visit: Payer: Self-pay

## 2020-07-05 VITALS — BP 94/66 | HR 60 | Temp 97.6°F | Resp 20

## 2020-07-05 DIAGNOSIS — D120 Benign neoplasm of cecum: Secondary | ICD-10-CM | POA: Diagnosis not present

## 2020-07-05 DIAGNOSIS — Z86010 Personal history of colonic polyps: Secondary | ICD-10-CM

## 2020-07-05 DIAGNOSIS — D123 Benign neoplasm of transverse colon: Secondary | ICD-10-CM | POA: Diagnosis not present

## 2020-07-05 DIAGNOSIS — D122 Benign neoplasm of ascending colon: Secondary | ICD-10-CM | POA: Diagnosis not present

## 2020-07-05 MED ORDER — SODIUM CHLORIDE 0.9 % IV SOLN: 500.0000 mL | INTRAVENOUS | Status: DC

## 2020-07-05 NOTE — Op Note (Signed)
[name] Procedure Date: 07/05/2020 9:36 AM MRN: [id number] Endoscopist: Jackquline Denmark , MD Age: 69 Referring MD:  Date of Birth: 1951/04/08 Gender: Male Account #: [id number] Procedure:                Colonoscopy Indications:              High risk colon cancer surveillance: Personal                            history of colonic polyps Medicines:                Monitored Anesthesia Care Procedure:                Pre-Anesthesia Assessment:                           - Prior to the procedure, a History and Physical                            was performed, and patient medications and                            allergies were reviewed. The patient's tolerance of                            previous anesthesia was also reviewed. The risks                            and benefits of the procedure and the sedation                            options and risks were discussed with the patient.                            All questions were answered, and informed consent                            was obtained. Prior Anticoagulants: The patient has                            taken no previous anticoagulant or antiplatelet                            agents. ASA Grade Assessment: II - A patient with                            mild systemic disease. After reviewing the risks                            and benefits, the patient was deemed in                            satisfactory condition to undergo the procedure.  After obtaining informed consent, the colonoscope                            was passed under direct vision. Throughout the                            procedure, the patient's blood pressure, pulse, and                            oxygen saturations were monitored continuously. The                            Colonoscope was introduced through the anus and                            advanced to the 2 cm into the ileum. The                             colonoscopy was performed without difficulty. The                            patient tolerated the procedure well. The quality                            of the bowel preparation was good. Anatomical                            landmarks were photographed. Scope In: 9:42:57 AM Scope Out: 10:02:43 AM Scope Withdrawal Time: 0 hours 14 minutes 37 seconds  Total Procedure Duration: 0 hours 19 minutes 46 seconds  Findings:                 Six sessile polyps were found in the proximal                            transverse colon (2), mid transverse colon(1),                            ascending colon(2) and cecum(1). The polyps were 2                            to 6 mm in size. These polyps were removed with a                            cold snare. Resection and retrieval were complete.                           A few small-mouthed diverticula were found in the                            neo-sigmoid colon.                           There was evidence of a prior end-to-end  colo-colonic anastomosis in the sigmoid colon, 15                            cm from the anal verge. This was patent and was                            characterized by healthy appearing mucosa.                           Non-bleeding internal hemorrhoids were found during                            retroflexion. The hemorrhoids were small.                           The terminal ileum appeared normal.                           The exam was otherwise without abnormality on                            direct and retroflexion views. Complications:            No immediate complications. Estimated Blood Loss:     Estimated blood loss: none. Impression:               - Six 2 to 6 mm polyps in the proximal transverse                            colon, in the mid transverse colon, in the                            ascending colon and in the cecum, removed with a                             cold snare. Resected and retrieved.                           - Mild neo-sigmoid diverticulosis.                           - Patent end-to-end colo-colonic anastomosis s/o                            low sigmoid resection, characterized by healthy                            appearing mucosa.                           - Non-bleeding internal hemorrhoids.                           - The examined portion of the ileum was normal.                           -  The examination was otherwise normal on direct                            and retroflexion views. Recommendation:           - Patient has a contact number available for                            emergencies. The signs and symptoms of potential                            delayed complications were discussed with the                            patient. Return to normal activities tomorrow.                            Written discharge instructions were provided to the                            patient.                           - Resume previous diet.                           - Continue present medications.                           - Await pathology results.                           - Repeat colonoscopy for surveillance based on                            pathology results.                           - The findings and recommendations were discussed                            with the patient's family. Jackquline Denmark, MD 07/05/2020 10:12:24 AM This report has been signed electronically.

## 2020-07-05 NOTE — Patient Instructions (Signed)
Handout on polyps, diverticulosis ,& hemorrhoids  given to you today  Await pathology results on polyps removed    YOU HAD AN ENDOSCOPIC PROCEDURE TODAY AT [place]:   Refer to the procedure report that was given to you for any specific questions about what was found during the examination.  If the procedure report does not answer your questions, please call your gastroenterologist to clarify.  If you requested that your care partner not be given the details of your procedure findings, then the procedure report has been included in a sealed envelope for you to review at your convenience later.  YOU SHOULD EXPECT: Some feelings of bloating in the abdomen. Passage of more gas than usual.  Walking can help get rid of the air that was put into your GI tract during the procedure and reduce the bloating. If you had a lower endoscopy (such as a colonoscopy or flexible sigmoidoscopy) you may notice spotting of blood in your stool or on the toilet paper. If you underwent a bowel prep for your procedure, you may not have a normal bowel movement for a few days.  Please Note:  You might notice some irritation and congestion in your nose or some drainage.  This is from the oxygen used during your procedure.  There is no need for concern and it should clear up in a day or so.  SYMPTOMS TO REPORT IMMEDIATELY:   Following lower endoscopy (colonoscopy or flexible sigmoidoscopy):  Excessive amounts of blood in the stool  Significant tenderness or worsening of abdominal pains  Swelling of the abdomen that is new, acute  Fever of 100F or higher    For urgent or emergent issues, a gastroenterologist can be reached at any hour by calling [phone]. Do not use MyChart messaging for urgent concerns.    DIET:  We do recommend a small meal at first, but then you may proceed to your regular diet.  Drink plenty of fluids but you should avoid alcoholic beverages for 24 hours.  ACTIVITY:   You should plan to take it easy for the rest of today and you should NOT DRIVE or use heavy machinery until tomorrow (because of the sedation medicines used during the test).    FOLLOW UP: Our staff will call the number listed on your records 48-72 hours following your procedure to check on you and address any questions or concerns that you may have regarding the information given to you following your procedure. If we do not reach you, we will leave a message.  We will attempt to reach you two times.  During this call, we will ask if you have developed any symptoms of COVID 19. If you develop any symptoms (ie: fever, flu-like symptoms, shortness of breath, cough etc.) before then, please call [phone].  If you test positive for Covid 19 in the 2 weeks post procedure, please call and report this information to [place].    If any biopsies were taken you will be contacted by phone or by letter within the next 1-3 weeks.  Please call us at [phone] if you have not heard about the biopsies in 3 weeks.    SIGNATURES/CONFIDENTIALITY: You and/or your care partner have signed paperwork which will be entered into your electronic medical record.  These signatures attest to the fact that that the information above on your After Visit Summary has been reviewed and is understood.  Full responsibility of the confidentiality of this discharge information lies  with you and/or your care-partner.

## 2020-07-05 NOTE — Progress Notes (Signed)
pt tolerated well. VSS. awake and to recovery. Report given to RN.  

## 2020-07-07 ENCOUNTER — Telehealth: Payer: Self-pay

## 2020-07-07 NOTE — Telephone Encounter (Signed)
First post procedure follow up call, no answer 

## 2020-07-07 NOTE — Telephone Encounter (Signed)
Second post procedure follow up call, no answer 

## 2020-07-14 ENCOUNTER — Encounter: Payer: Self-pay | Admitting: Gastroenterology

## 2020-07-18 ENCOUNTER — Ambulatory Visit: Payer: Medicare PPO | Admitting: Neurology

## 2020-07-18 ENCOUNTER — Other Ambulatory Visit: Payer: Self-pay

## 2020-07-18 ENCOUNTER — Encounter: Payer: Self-pay | Admitting: Neurology

## 2020-07-18 VITALS — BP 129/85 | HR 57 | Ht 72.0 in | Wt 202.0 lb

## 2020-07-18 DIAGNOSIS — G3184 Mild cognitive impairment, so stated: Secondary | ICD-10-CM | POA: Diagnosis not present

## 2020-07-18 NOTE — Progress Notes (Signed)
NEUROLOGY CONSULTATION NOTE  Jonathon Parker [facility] MRN: [id number] DOB: 1951/02/10  Referring provider: Dr. Nelda Bucks Primary care provider: Dr. Nelda Bucks  Reason for consult:  Memory loss  Dear Dr Delena Bali:  Thank you for your kind referral of Jonathon Parker for consultation of the above symptoms. Although his history is well known to you, please allow me to reiterate it for the purpose of our medical record. The patient was accompanied to the clinic by his wife of 66 years, Olegario Shearer who also provides collateral information. Records and images were personally reviewed where available.   HISTORY OF PRESENT ILLNESS: This is a 69 year old right-handed man with a history of CLL, hypertension, depression, asthma, presenting for evaluation of memory loss.  He feels his memory is not as good as it used to be. He started noticing changes 2 years ago, however his wife has noticed changes for at least 5 years. She noticed he would get confused about what she is trying to tell him, saying she is not clear. He occasionally repeats himself. He could not recall if he took his medications so they got a pillbox last year which has helped. He manages his own medications. His wife manages finances. He denies getting lost driving. He would forget what he went to get in a room. He misplaces things. He has occasional word-finding difficulties. His wife has noticed some personality changes so Zoloft was added to Wellbutrin. He takes 1/2 tab Zoloft due to dizziness on full tablet. No hallucinations. Sleep is good with Trazodone. He feels his mood is "sometimes good." His mother had Alzheimer's disease. No history of significant head injuries. He drinks 2 glasses of alcohol at night.   He has pain in his neck and shoulders. He has had some bowel issues since colon surgery. He has had bilateral hand tremors for several years, affecting writing and when drinking from a glass. No family history of  tremors. He denies any headaches, dizziness, diplopia, dysarthria, dysphagia, back pain, focal numbness/tingling/weakness, bladder dysfunction, anosmia, no falls. 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   MRI brain without contrast done 03/2020 reported advanced atrophy for age. No acute changes. Images unavailable for review. He was started on Donepezil 5mg  daily which he is tolerating without side effects.   Laboratory Data: 05/2020: TSH 1.820, B12 384  PAST MEDICAL HISTORY: Past Medical History:  Diagnosis Date  . Allergy   . Arthritis    not dx'd  . Asthma    mild   . Cholecystitis   . 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 Karle Starch in [facility] hospital    . Depression   . GERD (gastroesophageal reflux disease)    controlled with nexium   . Hypertension     PAST SURGICAL HISTORY: Past Surgical History:  Procedure Laterality Date  . APPENDECTOMY    . CARPAL TUNNEL RELEASE     left   . CHOLECYSTECTOMY N/A 04/20/2019   Procedure: LAPAROSCOPIC CHOLECYSTECTOMY;  Surgeon: Greer Pickerel, MD;  Location: [facility];  Service: General;  Laterality: N/A;  . COLON SURGERY  2002   10 inches of colon taken out   . COLONOSCOPY    . ENDOSCOPIC RETROGRADE CHOLANGIOPANCREATOGRAPHY (ERCP) WITH PROPOFOL N/A 03/01/2019   Procedure: ENDOSCOPIC RETROGRADE CHOLANGIOPANCREATOGRAPHY (ERCP) WITH PROPOFOL;  Surgeon: Rush Landmark Telford Nab., MD;  Location: [place];  Service: Gastroenterology;  Laterality: N/A;  . ERCP  03/01/2019  .  HERNIA REPAIR     bilateral inguinal   . IR CHOLANGIOGRAM EXISTING TUBE  03/25/2019  . IR PERC CHOLECYSTOSTOMY  03/02/2019  . left heel reconstruction  01/2002   17 screws and 2 plates   . POLYPECTOMY    . REMOVAL OF STONES  03/01/2019   Procedure: REMOVAL OF STONES;  Surgeon: Rush Landmark Telford Nab., MD;  Location: [place];  Service: Gastroenterology;;  . [name]  03/01/2019   Procedure:  [name];  Surgeon: [name], MD;  Location: [place];  Service: Gastroenterology;;  . tooth extracted     with laughing gas   . UPPER GASTROINTESTINAL ENDOSCOPY      MEDICATIONS: Current Outpatient Medications on File Prior to Visit  Medication Sig Dispense Refill  . albuterol (VENTOLIN HFA) 108 (90 Base) MCG/ACT inhaler Inhale 2 puffs into the lungs every 6 (six) hours as needed for wheezing or shortness of breath. 18 g 5  . budesonide-formoterol (SYMBICORT) 80-4.5 MCG/ACT inhaler Inhale 2 puffs into the lungs in the morning and at bedtime. 1 Inhaler 5  . buPROPion (WELLBUTRIN XL) 300 MG 24 hr tablet Take 300 mg by mouth daily.     Marland Kitchen docusate sodium (COLACE) 100 MG capsule Take 1 capsule (100 mg total) by mouth 2 (two) times daily. 60 capsule 0  . donepezil (ARICEPT) 5 MG tablet Take 5 mg by mouth daily.    Marland Kitchen esomeprazole (NEXIUM) 40 MG capsule Take 40 mg by mouth daily.     Marland Kitchen lisinopril (ZESTRIL) 20 MG tablet Take 20 mg by mouth at bedtime.     . polyethylene glycol powder (GLYCOLAX/MIRALAX) 17 GM/SCOOP powder Take 1 capful of powder mixed in drink by mouth 1 to 3 times daily Until daily soft stools  OTC 225 g 0  . sertraline (ZOLOFT) 50 MG tablet Take 25 mg by mouth daily.    . traZODone (DESYREL) 100 MG tablet Take 100 mg by mouth at bedtime.    . ALPRAZolam (XANAX) 0.25 MG tablet Take 0.25 mg by mouth at bedtime as needed for anxiety. (Patient not taking: No sig reported)    . ibuprofen (ADVIL) 400 MG tablet Take 400 mg by mouth every 6 (six) hours as needed for fever, headache or moderate pain. (Patient not taking: No sig reported)     No current facility-administered medications on file prior to visit.    ALLERGIES: No Known Allergies  FAMILY HISTORY: Family History  Problem Relation Age of Onset  . Prostate cancer Brother   . Colon cancer Neg Hx   . Esophageal cancer Neg Hx   . Colon polyps Neg Hx   . Rectal cancer Neg Hx   . Stomach cancer Neg  Hx     SOCIAL HISTORY: Social History   Socioeconomic History  . Marital status: Married    Spouse name: Not on file  . Number of children: Not on file  . Years of education: Not on file  . Highest education level: Not on file  Occupational History  . Not on file  Tobacco Use  . Smoking status: Never Smoker  . Smokeless tobacco: Never Used  Vaping Use  . Vaping Use: Never used  Substance and Sexual Activity  . Alcohol use: Yes    Comment: 1 drink before supper  . Drug use: No  . Sexual activity: Yes  Other Topics Concern  . Not on file  Social History Narrative   Right handed    Lives with wife   Social Determinants  of Health   Financial Resource Strain: Not on file  Food Insecurity: Not on file  Transportation Needs: Not on file  Physical Activity: Not on file  Stress: Not on file  Social Connections: Not on file  Intimate Partner Violence: Not on file     PHYSICAL EXAM: Vitals:   07/18/20 1031  BP: 129/85  Pulse: (!) 57  SpO2: 97%   General: No acute distress Head:  Normocephalic/atraumatic Skin/Extremities: No rash, no edema Neurological Exam: Mental status: alert and oriented to person, place, and time, no dysarthria or aphasia, Fund of knowledge is appropriate.  Recent and remote memory are intact.  Attention and concentration are reduced.    Able to name objects and repeat phrases. MOCA score 24/30. Montreal Cognitive Assessment  07/21/2020  Visuospatial/ Executive (0/5) 4  Naming (0/3) 3  Attention: Read list of digits (0/2) 0  Attention: Read list of letters (0/1) 0  Attention: Serial 7 subtraction starting at 100 (0/3) 3  Language: Repeat phrase (0/2) 1  Language : Fluency (0/1) 0  Abstraction (0/2) 2  Delayed Recall (0/5) 5  Orientation (0/6) 6  Total 24   Cranial nerves: CN I: not tested CN II: pupils equal, round and reactive to light, visual fields intact CN III, IV, VI:  full range of motion, no nystagmus, no ptosis CN V: facial  sensation intact CN VII: upper and lower face symmetric CN VIII: hearing intact to conversation CN IX, X: gag intact, uvula midline CN XI: sternocleidomastoid and trapezius muscles intact CN XII: tongue midline Bulk & Tone: normal, no fasciculations. Motor: 5/5 throughout with no pronator drift. Sensation: intact to light touch, cold, pin, vibration sense.  No extinction to double simultaneous stimulation.  Romberg test negative Deep Tendon Reflexes: +2 throughout Cerebellar: no incoordination on finger to nose testing Gait: narrow-based and steady, able to tandem walk adequately. Tremor: none   IMPRESSION: This is a 69 year old right-handed man with a history of CLL, hypertension, depression, asthma, presenting for evaluation of memory loss. His neurological exam is non-focal, MOCA score 24/30. Symptom suggestive of Mild Cognitive Impairment. Mild cognitive impairment means there are serious cognitive problems by report and testing but the patient is functioning normally. MRI brain reported age-advanced atrophy. We discussed doing Neurocognitive testing to further evaluate cognitive concerns. Continue Donepezil 5mg  daily. We discussed the importance of control of vascular risk factors, physical exercise, and brain stimulation exercises for brain health. Follow-up in 6-8 months, they know to call for any changes.   Thank you for allowing me to participate in the care of this patient. Please do not hesitate to call for any questions or concerns.   Ellouise Newer, M.D.  CC: Dr. Delena Bali

## 2020-07-18 NOTE — Patient Instructions (Signed)
Good to meet you!  1. Schedule Neurocognitive testing  2. Continue Donepezil 5mg  daily  3. There are some activities which have therapeutic value and can be useful in keeping you cognitively stimulated. You can try this website: https://www.[facility].org/get-to-know-barrow/centers-programs/neurorehabilitation-center/neuro-rehab-apps-and-games/ which has options, categorized by level of difficulty.  4. Follow-up in 6-8 months, call for any changes  You have been referred for a neuropsychological evaluation (i.e., evaluation of memory and thinking abilities). Please bring someone with you to this appointment if possible, as it is helpful for the doctor to hear from both you and another adult who knows you well. Please bring eyeglasses and hearing aids if you wear them.    The evaluation will take approximately 3 hours and has two parts:   . The first part is a clinical interview with the neuropsychologist (Dr. Melvyn Novas or Dr. Nicole Kindred). During the interview, the neuropsychologist will speak with you and the individual you brought to the appointment.    . The second part of the evaluation is testing with the doctor's technician Hinton Dyer or Maudie Mercury). During the testing, the technician will ask you to remember different types of material, solve problems, and answer some questionnaires. Your family member will not be present for this portion of the evaluation.   Please note: We must reserve several hours of the neuropsychologist's time and the psychometrician's time for your evaluation appointment. As such, there is a No-Show fee of $100. If you are unable to attend any of your appointments, please contact our office as soon as possible to reschedule.     RECOMMENDATIONS FOR ALL PATIENTS WITH MEMORY PROBLEMS: 1. Continue to exercise (Recommend 30 minutes of walking everyday, or 3 hours every week) 2. Increase social interactions - continue going to [place] and enjoy social gatherings with friends and  family 3. Eat healthy, avoid fried foods and eat more fruits and vegetables 4. Maintain adequate blood pressure, blood sugar, and blood cholesterol level. Reducing the risk of stroke and cardiovascular disease also helps promoting better memory. 5. Avoid stressful situations. Live a simple life and avoid aggravations. Organize your time and prepare for the next day in anticipation. 6. Sleep well, avoid any interruptions of sleep and avoid any distractions in the bedroom that may interfere with adequate sleep quality 7. Avoid sugar, avoid sweets as there is a strong link between excessive sugar intake, diabetes, and cognitive impairment We discussed the Mediterranean diet, which has been shown to help patients reduce the risk of progressive memory disorders and reduces cardiovascular risk. This includes eating fish, eat fruits and green leafy vegetables, nuts like almonds and hazelnuts, walnuts, and also use olive oil. Avoid fast foods and fried foods as much as possible. Avoid sweets and sugar as sugar use has been linked to worsening of memory function.

## 2020-07-24 DIAGNOSIS — J020 Streptococcal pharyngitis: Secondary | ICD-10-CM | POA: Diagnosis not present

## 2020-08-07 DIAGNOSIS — Z20822 Contact with and (suspected) exposure to covid-19: Secondary | ICD-10-CM | POA: Diagnosis not present

## 2020-08-10 DIAGNOSIS — R6889 Other general symptoms and signs: Secondary | ICD-10-CM | POA: Diagnosis not present

## 2020-08-10 DIAGNOSIS — R059 Cough, unspecified: Secondary | ICD-10-CM | POA: Diagnosis not present

## 2020-08-30 DIAGNOSIS — R413 Other amnesia: Secondary | ICD-10-CM | POA: Diagnosis not present

## 2020-08-30 DIAGNOSIS — F331 Major depressive disorder, recurrent, moderate: Secondary | ICD-10-CM | POA: Diagnosis not present

## 2020-08-30 DIAGNOSIS — I10 Essential (primary) hypertension: Secondary | ICD-10-CM | POA: Diagnosis not present

## 2020-08-30 DIAGNOSIS — J4520 Mild intermittent asthma, uncomplicated: Secondary | ICD-10-CM | POA: Diagnosis not present

## 2020-08-30 DIAGNOSIS — K219 Gastro-esophageal reflux disease without esophagitis: Secondary | ICD-10-CM | POA: Diagnosis not present

## 2020-08-30 DIAGNOSIS — F5104 Psychophysiologic insomnia: Secondary | ICD-10-CM | POA: Diagnosis not present

## 2020-08-30 DIAGNOSIS — E559 Vitamin D deficiency, unspecified: Secondary | ICD-10-CM | POA: Diagnosis not present

## 2020-09-06 ENCOUNTER — Encounter: Payer: Self-pay | Admitting: Counselor

## 2020-09-06 ENCOUNTER — Ambulatory Visit: Payer: Medicare PPO | Admitting: Counselor

## 2020-09-06 ENCOUNTER — Ambulatory Visit: Payer: Medicare PPO | Admitting: Psychology

## 2020-09-06 ENCOUNTER — Other Ambulatory Visit: Payer: Self-pay

## 2020-09-06 DIAGNOSIS — F32A Depression, unspecified: Secondary | ICD-10-CM

## 2020-09-06 DIAGNOSIS — F09 Unspecified mental disorder due to known physiological condition: Secondary | ICD-10-CM | POA: Diagnosis not present

## 2020-09-06 DIAGNOSIS — F329 Major depressive disorder, single episode, unspecified: Secondary | ICD-10-CM

## 2020-09-06 NOTE — Progress Notes (Signed)
Psychometrist Note   Cognitive testing was administered to Jonathon Parker by Dana Chamberlain, B.S. (Technician) under the supervision of Peter Stewart, Psy.D., ABN. [name] was able to tolerate all test procedures. Dr. Stewart met with the patient as needed to manage any emotional reactions to the testing procedures. Rest breaks were offered.    The battery of tests administered was selected by Dr. Stewart with consideration to the patient's current level of functioning, the nature of his symptoms, emotional and behavioral responses during the interview, level of literacy, observed level of motivation/effort, and the nature of the referral question. This battery was communicated to the psychometrist. Communication between Dr. Stewart and the psychometrist was ongoing throughout the evaluation and Dr. Stewart was immediately accessible at all times. Dr. Stewart provided supervision to the technician on the date of this service, to the extent necessary to assure the quality of all services provided.    Jonathon Parker will return in approximately one week for an interactive feedback session with Dr. Stewart, at which time test performance, clinical impressions, and treatment recommendations will be reviewed in detail. The patient understands he can contact our office should he require our assistance before this time.   A total of 115 minutes of billable time were spent with Jonathon Parker by the technician, including test administration and scoring time. Billing for these services is reflected in Dr. Stewart's note.   This note reflects time spent with the psychometrician and does not include test scores, clinical history, or any interpretations made by Dr. Stewart. The full report will follow in a separate note. 

## 2020-09-06 NOTE — Progress Notes (Unsigned)
[facility]  Patient Name: Sakib Noguez [facility] MRN: [id number] Date of Birth: 22-Mar-1951 Age: 71 y.o. Education: 16 years  Referral Circumstances and Background Information  Mr. Billey Wojciak is a 70 y.o., right-hand dominant, married man with a medical history of CLL, hypertension, depression, asthma, and memory and thinking problems noticed by his wife over the past 5 years. He was seen recently by Dr. Delice Lesch, who demonstrated a MoCA of 24/30 and referred him for neuropsychological evaluation.   On interview, the patient stated that he does have some problems with memory and thinking over the past several years. His wife on the other hand thinks that she started noticing changes around 5 years ago. His problems started with forgetting conversations they have had and details of things. She initially thought it was "selective hearing" but it has worsened over time and she became concerned. They didn't provide many other examples, he is not forgetting entire events or repeating himself, and it sounds like the difficulties are quite mild. He has difficulties with focus and doesn't feel like he can get the things done now that he did in the past. He also is also easily overwhelmed and has a harder time making decisions, even basic personal decisions such as where they want to eat, as per his wife. His wife described him as more easily frustrated, and "at his wits end," for instance when dealing with their grandchildren. They are denying much in the way of significant social changes such as acting differently, inappropriate familiarity with strangers, or inappropriate anger, he just gets "agitated" more easily. These memory changes happen in the setting of general health difficulties, he was diagnosed with CLL several years ago and has had some complications with infections and other various issues. He also has more in the way of fatigue than he used to. They do  not appreciate any problems with language comprehension or expression. There is no clear hoarding or collecting behavior (although he does collect art related things, he always did that). They are denying that he has any profound apathy. He has no visual hallucinations, auditory hallucinations, or mistaken beliefs.   With respect to movement, he complained of postural/action tremor of the BUE, and he does appear to be somewhat tremulous at times today but there is no visible rest tremor. I do not see that much in the way of tremor was noted during his exam with Dr. Delice Lesch. He denied any significant movement symptoms although they don't think his balance is as good as it used to be. With respect to mood, the patient does get down about his health issues from time to time. He also worries quite a bit, his wife thinks it all "crashes down" on him. He was on welbutrin for many years, which was recently augmented with zoloft by his PCP Dr. Delena Bali. With respect to sleep, they reported that he is taking trazodone because he was having problems. He gets anywhere from 5-7 hours of sleep per night on the medication. His appetite is stable.   In terms of functioning, they are denying that there is much in the way of changes. His wife said that she did the taxes last year and he always used to do it, although they are saying that is just because it "frustrates" him, not that he cannot do it. His wife manages finances. He is still driving and is able to do it safely, according to his wife, although he sometimes makes minor errors and  feels he isn't as good with directions as  in the past. He is still doing his own medications, she doesn't check, but she thinks he's reliable. He was reportedly having problems previously. They do not notice any issues in terms of his community utilization, he can go shopping and do what he likes. He was previously very involved in the arts guild, about 5 or 6 years ago, but he quit. He had  done that for many years. He used to be very involved in art and no longer does it, although it sounds more motivational than anything. He is denying any issues with functioning around the house, such as operating appliances or the like. No changes in attentiveness to hygiene.   Past Medical History and Review of Relevant Studies   Patient Active Problem List   Diagnosis Date Noted  . Mild intermittent asthma 01/28/2020  . Dyspnea 05/11/2019  . Thrombocytosis 05/11/2019  . Alcohol dependence ([place]) 05/11/2019  . Abnormal findings on diagnostic imaging of gall bladder 05/10/2019  . Essential hypertension 04/22/2019  . Post-operative complication 44/08/270  . Acute respiratory failure with hypoxia ([place]) 04/21/2019  . SIRS (systemic inflammatory response syndrome) ([place]) 04/21/2019  . Status post laparoscopic cholecystectomy 04/21/2019  . HCAP (healthcare-associated pneumonia) 04/21/2019  . CLL (chronic lymphocytic leukemia) ([place]) 04/21/2019  . Calculous cholecystitis 02/28/2019  . Campylobacter diarrhea   . Diarrhea of infectious origin   . STEC (Shiga toxin-producing Escherichia coli)   . Elevated LFTs   . Hypokalemia   . Gastroenteritis 01/19/2017  . Sepsis ([place]) 01/19/2017  . GERD (gastroesophageal reflux disease) 01/19/2017  . Depression 01/19/2017   Review of Neuroimaging and Relevant Medical History: The patient had an MRI of the brain (04/03/2020) at an outside center, the images of which are not available for review. The report mentions advanced atrophy for age, with no lobar predilection, and suggests quantitative volumetric MRI for evaluation of dementia. I requested that the get a copy of those images to bring to the follow up appointment.   The patient denied any history of significant head injuries, strokes, seizures, or neurological surgeries.   Current Outpatient Medications  Medication Sig Dispense Refill  . albuterol (VENTOLIN HFA) 108 (90 Base) MCG/ACT inhaler  Inhale 2 puffs into the lungs every 6 (six) hours as needed for wheezing or shortness of breath. 18 g 5  . ALPRAZolam (XANAX) 0.25 MG tablet Take 0.25 mg by mouth at bedtime as needed for anxiety. (Patient not taking: No sig reported)    . budesonide-formoterol (SYMBICORT) 80-4.5 MCG/ACT inhaler Inhale 2 puffs into the lungs in the morning and at bedtime. 1 Inhaler 5  . buPROPion (WELLBUTRIN XL) 300 MG 24 hr tablet Take 300 mg by mouth daily.     Marland Kitchen docusate sodium (COLACE) 100 MG capsule Take 1 capsule (100 mg total) by mouth 2 (two) times daily. 60 capsule 0  . donepezil (ARICEPT) 5 MG tablet Take 5 mg by mouth daily.    Marland Kitchen esomeprazole (NEXIUM) 40 MG capsule Take 40 mg by mouth daily.     Marland Kitchen ibuprofen (ADVIL) 400 MG tablet Take 400 mg by mouth every 6 (six) hours as needed for fever, headache or moderate pain. (Patient not taking: No sig reported)    . lisinopril (ZESTRIL) 20 MG tablet Take 20 mg by mouth at bedtime.     . polyethylene glycol powder (GLYCOLAX/MIRALAX) 17 GM/SCOOP powder Take 1 capful of powder mixed in drink by mouth 1 to 3 times daily Until daily  soft stools  OTC 225 g 0  . sertraline (ZOLOFT) 50 MG tablet Take 25 mg by mouth daily.    . traZODone (DESYREL) 100 MG tablet Take 100 mg by mouth at bedtime.     No current facility-administered medications for this visit.   Family History  Problem Relation Age of Onset  . Prostate cancer Brother   . Colon cancer Neg Hx   . Esophageal cancer Neg Hx   . Colon polyps Neg Hx   . Rectal cancer Neg Hx   . Stomach cancer Neg Hx    There is a family history of dementia. His mother had what sounds like Alzheimer's, she would get lost and call the house "30 times a day." There is a family history of psychiatric illness, his brother may have PTSD and alcohol/substance use issues. He is 10 years older.   Psychosocial History  Developmental, Educational and Employment History: The patient reported a normal childhood development. The  patient told me that he did very well in school, earned near straight A's and was never held back with no learning difficulties. He then later told the psychometrician that he had reading problems and required remedial instruction and had to work very hard to do well. This will need to be clarified. Nevertheless, he went on to earn a B.A. in Art Education from [employer]. 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 He fell off a ladder and shattered his heel, eventually deciding to retire after that. He was still heavily involved in art in his retirement, particularly sculpture, although he has been less active over the past 5 years or so.   Psychiatric History: The patient has no significant history of mental health issues, although there was a period around 10 or 15 years ago where he was getting "really pissy" and was started on Wellbutrin and he did well with that. He also had some difficulties lately related to his health issues, which he has been treating with Lexapro from his PCP.   Substance Use History: The patient drinks alcohol daily, typically around 3 drinks a day. He doesn't use illicit substances and has never smoked.   Relationship History and Living Cimcumstances: The patient and his wife have been married 47 years. They have two children, both sons, they think they have noticed the patient's problems.   Mental Status and Behavioral Observations  Sensorium/Arousal: The patient's level of arousal was awake and alert. Hearing and vision were adequate for testing purposes. Orientation: The patient was generally alert and oriented.  Appearance: Dressed in appropriate, casual clothing with reasonable grooming and hygiene.  Behavior: The patient was pleasant, appropriate, and had a good sense of humor Speech/language: Normal in rate, rhythm, volume and  prosody Gait/Posture: Not formally examined, normal on exam with Dr. Karel Jarvis Movement: Some tremulousness noted at times, no rest tremor Social Comportment: Related well Mood: "I get down about this" Affect: Mainly neutral with an undercurrent of dysphoria, patient is frustrated he cannot do things like he used to Thought process/content: Logical, linear, goal-oriented, no thought disorder. Content was appropriate.  Safety: No safety concerns identified at today's encounter.  Insight: Fair  Mental status testing deferred, MoCA of 24/30 recently with Dr. Karel Jarvis.   Test Procedures  Wide Range Achievement Test - 4  Word Reading Thad Ranger' Intellectual Screening Test Neuropsychological Assessment Battery  Memory Module  Naming  Digit Span Repeatable Battery for the Assessment of Neuropsychological Status (Form A)  Figure Copy  Judgment of Line Orientation  Coding  Figure Recall The Dot Counting Test A Random Letter Test Controlled Oral Word Association (F-A-S) Semantic Fluency (Animals) Trail Making Test A & B Complex Ideational Material Modified [place] Card Sorting Test Geriatric Depression Scale - Short Form Quick Dementia Rating System (completed by wife [place])  Plan  Mikah Poss was seen for a psychiatric diagnostic evaluation and neuropsychological testing. He is a pleasant 70 year old, right-hand dominant, married man with a history of HTN, CLL, depression vs. Adjustment disorder, and memory and thinking problems. He has noticed issues with memory and distractibility for the past few years, his wife thinks they started 5 years ago. These changes do not appreciably impair his day-to-day functioning but have been annoying and concerning to his family. His cognitive complaints happen in the setting of depression and recent health issues secondary to CLL that the patient finds stressful. If there is a cognitive problem, I have the sense that it is fairly mild.  I would like to see the images for his MRI, which was read as showing advanced atrophy for age (I wonder if his drinking history may be throwing that off because he is drinking ~3 drinks a day). Full and complete note with impressions, recommendations, and interpretation of test data to follow.   Bettye Boeck Roseanne Reno, PsyD, ABN Clinical Neuropsychologist  Informed Consent and Coding/Compliance  Risks and benefits of the evaluation were discussed with the patient prior to all testing procedures. I conducted a clinical interview   with Terri Skains and Wallace Keller, B.S. (Technician) administered test procedures. The patient was able to tolerate the testing procedures and the patient (and/or family if applicable) is likely to benefit from further follow up to receive the diagnosis and treatment recommendations, which will be rendered at the next encounter. Billing below reflects technician time, my direct face-to-face time with the patient, time spent in test administration, and time spent in professional activities including but not limited to: neuropsychological test interpretation, integration of neuropsychological test data with clinical history, report preparation, treatment planning, care coordination, and review of diagnostically pertinent medical history or studies.   Services associated with this encounter: Clinical Interview [phone]) plus 60 minutes (75102; Neuropsychological Evaluation by Professional)  110 minutes (58527; Neuropsychological Evaluation by Professional, Adl.) 30 minutes ([id number]; Neuropsychological Testing by Technician) 85 minutes ([id number]; Neuropsychological Testing by Technician, Adl.)

## 2020-09-07 NOTE — Progress Notes (Unsigned)
[facility]  Patient Name: Jonathon Parker [facility] MRN: [id number] Date of Birth: April 08, 1951 Age: 70 y.o. Education: 16 years  Measurement properties of test scores: IQ, Index, and Standard Scores (SS): Mean = 100; Standard Deviation = 15 Scaled Scores (Ss): Mean = 10; Standard Deviation = 3 Z scores (Z): Mean = 0; Standard Deviation = 1 T scores (T); Mean = 50; Standard Deviation = 10  TEST SCORES:    Note: This summary of test scores accompanies the interpretive report and should not be interpreted by unqualified individuals or in isolation without reference to the report. Test scores are relative to age, gender, and educational history as available and appropriate.   Performance Validity        "A" Random Letter Test Raw  Descriptor      Errors 0 Within Expectation  The Dot Counting Test: 10 Within Expectation      Embedded Measures: Raw  Descriptor      NAB Effort Index 2 Within Expectation      Mental Status Screening     Total Score Descriptor  MoCA 24 Normal      Expected Functioning        Wide Range Achievement Test: Standard/Scaled Score Percentile      Word Reading 99 47      Reynolds Intellectual Screening Test Standard/T-score Percentile      Guess What 57 75      Odd Item Out 57 75  RIST Index 113 81      Attention/Processing Speed        Neuropsychological Assessment Battery (Attention Module, Form 1): Scaled/T-score Percentile      Digits Forward 36 8      Digits Backwards 39 14      Repeatable Battery for the Assessment of Neuropsychological Status (Form A): Standard Score Percentile     Coding 6 9      Language        Neuropsychological Assessment Battery (Language Module, Form 1): T-score Percentile      Naming   (31) 55 69      Verbal Fluency:  T Score Percentile      Controlled Oral Word Association (F-A-S) 41 18      Semantic Fluency (Animals) 41 18      Memory:        Neuropsychological  Assessment Battery (Memory Module, Form 1): T-score/Standard Score Percentile  Memory Index (MEM): 77 6      List Learning           List A Immediate Recall   (3 , 3 , 6) 28 2         List B Immediate Recall   (3) 40 16         List A Short Delayed Recall   (5) 38 12         List A Long Delayed Recall   (6) 43 25         List A Percent Retention   (120 %) --- 79         List A Long Delayed Yes/No Recognition Hits   (12) --- 79         List A Long Delayed Yes/No Recognition False Alarms   (9) --- 4         List A Recognition Discriminability Index --- 9      Shape Learning           Immediate Recognition   (  4 , 2 , 4) 33 5         Delayed Recognition   (4) 38 12         Percent Retention   (100 %) --- 54         Delayed Forced-Choice Recognition Hits   (8) --- 58         Delayed Forced-Choice Recognition False Alarms   (2) --- 27         Delayed Forced-Choice Recognition Discriminability --- 38      Story Learning           Immediate Recall   (24, 31) 43 25         Delayed Recall   (28) 41 18         Percent Retention   (90 %) --- 54      Daily Living Memory            Immediate Recall   (18, 18) 37 9          Delayed Recall   (6, 8) 48 42          Percent Retention (93 %) --- 66          Recognition Hits   (8) --- 27      Repeatable Battery for the Assessment of Neuropsychological Status (Form A): Scaled Score Percentile         Figure Recall   (13) 10 50      Visuospatial/Constructional Functioning        Repeatable Battery for the Assessment of Neuropsychological Status (Form A): Standard/Scaled Score Percentile      Visuospatial/Constructional Index 89 23         Figure Copy   (19) 11 63         Judgment of Line Orientation   (12) --- 3-9      Executive Functioning        Modified [employer] Test (MWCST): Standard/T-Score Percentile      Number of Categories Correct 36 8      Number of Perseverative Errors 53 62      Number of Total Errors 44 27      Percent  Perseverative Errors 57 75  Executive Function Composite 91 27      Trail Making Test: T-Score Percentile      Part A 59 82      Part B 44 27      [place] Diagnostic Aphasia Exam: Raw Score Scaled Score      Complex Ideational Material 12 12      Rating Scales        Clinical Dementia Rating Raw Score Descriptor      Sum of Boxes 2.0 Mild Cognitive Impairment      Global Score 0.5 MCI      Quick Dementia Rating System Raw Score Descriptor      Sum of Boxes 2.5 Very Mild Dementia      Total Score 5 MCI  Geriatric Depression Scale - Short Form 3 Negative   Shaquinta Peruski V. Nicole Kindred PsyD, [place] Clinical Neuropsychologist

## 2020-09-08 ENCOUNTER — Encounter: Payer: Self-pay | Admitting: Counselor

## 2020-09-08 NOTE — Progress Notes (Signed)
[facility]  Patient Name: Jonathon Parker MRN: [id number] Date of Birth: [birth year] Age: 70 y.o. Education: 58 years  Clinical Impressions  Jonathon Parker is a 70 y.o., right-hand dominant, married man with a medical history of CLL, hypertension, depression, asthma, and memory and thinking problems noticed by his wife over the past 5 years. He also has some mood issues, which have been exacerbated in the context of his CLL and recent health concerns. In terms of day-to-day complaints, the patient himself feels as though he is inattentive, his wife perceives him to be forgetful, and he does have diminished motivation and fatigue. He is still functioning well, although he has given up things that he used to enjoy like art and fixing things around the house (sounds like an energy/motivation issue). He enjoys drinking alcohol and has about 3 drinks a day. He has an MRI from [facility], the images of which I was unable to review, but the report mentions advanced atrophy for age. He had a normal elemental neurological exam with Dr. Delice Lesch.   Neuropsychological testing shows a pattern of findings that can be summed up as reflecting mainly frontal-subcortical type issues, with less than expected memory performance (mainly due to encoding issues), diminished performance on indicators of attention, working memory, and processing efficiency. There is a low score on judgment of line orientations that does not hang with these other findings and is somewhat unexpected given his vocational history although it is only one finding and thus, the significance is not entirely clear. More specifically, he demonstrated unusually low overall memory performance and had difficulties with encoding of unstructured verbal information and visual information. He did better with structured information. These findings are often seen when executive difficulties attentuate memory  performance and do not represent a clear storage problem. He performed adequately on measures of visual object confrontation naming and verbal fluency.   Jonathon Parker is thus manifesting objective cognitive impairment relative to expectations for him with a profile showing mainly frontal-subcortical type issues. He was somewhat inconsistent and reported to me that he had no difficulties in school but then told the technician that he was in remedial reading and in fact thought he was dyslexic. While that may be a factor, he had no indication of learning problems on testing and in the context of his clinical history, I do think these test scores warrant a diagnosis of mild neurocognitive disorder. There is at least some possibility that he is at risk for decline. I would like to see the actual images associated with his MRI of the brain to see if there is any diagnostic atrophy. This pattern would also broadly go well with subcortical vascular disease, although there was no mention of that on his MRI. I will also counsel him regarding alcohol as a contributor to cognitive impairment when consumed in excess and on brain-health strategies including MIND-DASH diet and exercise. He may also benefit from re-engaging in hobbies he has given up, because it sounds like he is not challenging himself day-to-day.   Diagnostic Impressions: Mild neurocognitive disorder, nonamnestic Depressive disorder  Recommendations to be discussed with patient  Your performance and presentation on neuropsychological assessment were consistent with some mild diminishment that I would sum up as reflecting reduced cognitive efficiency. This includes things such as memory encoding (I.e., getting new memories in), processing speed, attending to information, and working memory (I.e., holding information on line to do something with it). My sense is that these findings  likely due represent a decline for you and they map on well to your  day-to-day report of problems. 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   The major difference between mild cognitive impairment (MCI) and dementia is in severity and potential prognosis. Once someone reaches a level of severity adequate to be diagnosed with a dementia, there is usually progression over time, though this may be years. On the other hand, mild cognitive impairment, while a significant risk for dementia in future, does not always progress to dementia, and in some instances stays the same or can even revert to normal. It is important to realize that if MCI is due to underlying Alzheimer's disease, it will most likely progress to dementia eventually. The rate of conversion to Alzheimer's dementia from amnestic MCI is about 15% per year versus the general population risk of conversion of 2% per year.   In your case, I am not entirely sure what is causing your mild cognitive impairment and I do not think it is necessarily Alzheimer's disease. Your profile of test findings is not what I expect to see in Alzheimer's disease because of the type of memory problems you had. You also had good scores on measures of language that tend to decline in AD type presentations. One possibility is that this reflects cumulative effects of alcohol on the brain, because 3 or more drinks a day is perhaps enough to cause some cognitive problems. This is a weak explanation however, and there is also the possibility that your test scores are due to a declining condition. Therefore, I think it is prudent for you to return for evaluation in 1 to 2 years to see how you are doing and make sure there is no worsening.   Your MRI of the brain may further help refine your diagnosis, as it mentioned atrophy that is more advanced than expected for age. While that could be related to your drinking, it could also give a clue if there  is a separate underlying cause for your cognitive impairment. Viewing the images personally would allow me to better assess the volume loss and see if there are any diagnostic changes. It would also be helpful to see if there are any vascular changes in the brain. I assume if there are, they are not very pronounced, because they were not commented on by radiology but that would be good to verify.   Regardless of whether this is due to alcohol use, vascular disease, an underlying condition, or some combination thereof, the treatment of choice remains similar, and that is to engage in healthy lifestyle changes. Most drinkers are aware that drinking alcohol is not healthy in excess and so I will not belabor that point, but I will let you know that at 2 drinks, alcohol looses any health benefits and at 3 drinks, it is likely doing more harm than good. This is particularly true in the elderly who often do not tolerate alcohol well. I would therefore suggest that you either stop drinking or limit your intake to no more than 1 standard beverage per day.   There is now good quality evidence from at least one large scale study that a modified mediterranean diet may help slow cognitive decline. This is known as the "MIND" diet. The Mind diet is not so much a specific diet as it is a set of recommendations for things that you should and  should not eat.   Foods that are ENCOURAGED on the MIND Diet:  Green, leafy vegetables: Aim for six or more servings per week. This includes kale, spinach, cooked greens and salads.  All other vegetables: Try to eat another vegetable in addition to the green leafy vegetables at least once a day. It is best to choose non-starchy vegetables because they have a lot of nutrients with a low number of calories.  Berries: Eat berries at least twice a week. There is a plethora of research on strawberries, and other berries such as blueberries, raspberries and blackberries have also been found  to have antioxidant and brain health benefits.  Nuts: Try to get five servings of nuts or more each week. The creators of the [place] don't specify what kind of nuts to consume, but it is probably best to vary the type of nuts you eat to obtain a variety of nutrients. Peanuts are a legume and do not fall into this category.  Olive oil: Use olive oil as your main cooking oil. There may be other heart-healthy alternatives such as algae oil, though there is not yet sufficient research upon which to base a formal recommendation.  Whole grains: Aim for at least three servings daily. Choose minimally processed grains like oatmeal, quinoa, brown rice, whole-wheat pasta and 100% whole-wheat bread.  Fish: Eat fish at least once a week. It is best to choose fatty fish like salmon, sardines, trout, tuna and mackerel for their high amounts of omega-3 fatty acids.  Beans: Include beans in at least four meals every week. This includes all beans, lentils and soybeans.  Poultry: Try to eat chicken or [place] at least twice a week. Note that fried chicken is not encouraged on the MIND diet.  Wine: Aim for no more than one glass of alcohol daily. Both red and white wine may benefit the brain. However, much research has focused on the red wine compound resveratrol, which may help protect against Alzheimer's disease.  Foods that are DISCOURAGED on the MIND Diet: Butter and margarine: Try to eat less than 1 tablespoon (about 14 grams) daily. Instead, try using olive oil as your primary cooking fat, and dipping your bread in olive oil with herbs.  Cheese: The MIND diet recommends limiting your cheese consumption to less than once per week.  Red meat: Aim for no more than three servings each week. This includes all beef, pork, lamb and products made from these meats.  [place] food: The MIND diet highly discourages fried food, especially the kind from fast-food restaurants. Limit your consumption to less than once per week.   Pastries and sweets: This includes most of the processed junk food and desserts you can think of. Ice cream, cookies, brownies, snack cakes, donuts, candy and more. Try to limit these to no more than four times a week.  Exercise is one of the best medicines for promoting health and maintaining cognitive fitness at all stages in life. Exercise probably has the largest documented effect on brain health and performance of any lifestyle intervention. Studies have shown that even previously sedentary individuals who start exercising as late as age 25 show a significant survival benefit as compared to their non-exercising peers. In the [place], the current guidelines are for 30 minutes of moderate exercise per day, but increasing your activity level less than that may also be helpful. You do not have to get your 30 minutes of exercise in one shot and exercising for short periods of  time spread throughout the day can be helpful. Go for several walks, learn to dance, or do something else you enjoy that gets your body moving. Of course, if you have an underlying medical condition or there is any question about whether it is safe for you to exercise, you should consult a medical treatment provider prior to beginning exercise.   Test Findings  Test scores are summarized in additional documentation associated with this encounter. Test scores are relative to age, gender, and educational history as available and appropriate. There were no concerns about performance validity as all findings fell within normal expectations.   General Intellectual Functioning/Achievement:  Performance on single word reading was average and performance on the RIST index was high average. Comparable high average scores were demonstrated both on the visually and verbally mediated subtests. These findings are not suggestive of dyslexia or reading problems and instead suggest normal reading abilities, although that is with the caveat that  the evaluation was not tailored to detect learning problems in great detail and sometimes individuals can compensate. In any event, average to high average present as reasonable standards of comparison for this patient's cognitive test data.   Attention and Processing Efficiency: Performance on indicators of attention and processing efficiency was generally weak, with unusually low digit repetition forward and low average digit repetition backward, which is a bit low considering this individuals premorbid functioning. Timed number symbol coding, a graphomotor measure combining processing speed with selective attention and association learning was unusually low. He did better with a high average score on simple numeric sequencing but that is a very easy test.   Language: Language findings demonstrated good capacities with average range, normal visual object confrontation naming. Generation of words in response to given letters and in response to the category prompt "animals."   Visuospatial Function: Performance on visuospatial and constructional measures was reasonable, falling at a low average level. Nevertheless, qualitatively his figure copy had very poor line quality and if scored stringently, would have pulled down his index score. He also had an unusually low score on judgment of angular line orientations. 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   Immunologist and Memory: Performance on measures of memory and learning was below expectations for an individual of Jonathon Parker overall cognitive ability. The profile of scores is not concerning for a storage problem, however, and rather shows encoding difficulties and perhaps some mild retrieval difficulties suggesting frontal-subcortical interference. He also had some difficulties learning unstructured as compared to structured information.   In the verbal realm, Jonathon Parker demonstrated extremely low  immediate recall for a 12-item word list with 3, 3, and 6 words across three learning trials. Short delayed recall for the word list was low average at 5 words and long delayed recall was low average at 6 words with some hypernesia. His recognition discriminability for the word list versus false choices was unusually low, however, which may in part be due to source memory issues (although he did also have false positives for words that were neither on the primary target list or the distractor alternate list). Memory for a short story was low average on immediate and delayed recall with good retention. Memory for brief daily living type information was low average on immediate recall and average on delayed recall. In this case his recognition discriminability was average.   In the visual realm, immediate recognition for a series of designs that are difficult to verbally encode was  unusually low. He retained that information well and demonstrated low average performance on delayed recall with 100% retention. Delayed yes/no recognition discriminability was average. Delayed free recall for a modestly complex figure was average.   Executive Functions: Performance on executive measures was generally good. Jonathon Parker demonstrated average performance on the Executive Function Composite of the Modified [employer], although his categories completed score was unusually low. His perseverative errors score was average, meaning that he was not able to discern many solution categories but he was not overly rigid is in approach to the task. Alternating sequencing of numbers and letters of the alphabet was average. Generation of words in response to the letters F-A-S was average. Reasoning with verbal information was average on the Complex [employer].   Rating Scale(s): Jonathon Parker screened negative for the presence of depression. His wife characterized him as functioning at a late MCI to very mild  dementia range. I was able to rate a CDR for him and his global and sum of boxes scores are MCI at most. He certainly does not meet clinical criteria for dementia.   Viviano Simas Nicole Kindred PsyD, [place] Clinical Neuropsychologist

## 2020-09-13 ENCOUNTER — Other Ambulatory Visit: Payer: Self-pay

## 2020-09-13 ENCOUNTER — Ambulatory Visit (INDEPENDENT_AMBULATORY_CARE_PROVIDER_SITE_OTHER): Payer: Medicare PPO | Admitting: Counselor

## 2020-09-13 ENCOUNTER — Encounter: Payer: Self-pay | Admitting: Counselor

## 2020-09-13 DIAGNOSIS — F329 Major depressive disorder, single episode, unspecified: Secondary | ICD-10-CM | POA: Diagnosis not present

## 2020-09-13 DIAGNOSIS — F32A Depression, unspecified: Secondary | ICD-10-CM

## 2020-09-13 DIAGNOSIS — G3184 Mild cognitive impairment, so stated: Secondary | ICD-10-CM | POA: Diagnosis not present

## 2020-09-13 NOTE — Progress Notes (Signed)
[facility]  Feedback Note: I met with Jonathon Parker to review the findings resulting from his neuropsychological evaluation. Since the last appointment, he has been about the same. We reviewed his MRI of the brain, which does show mild volume loss, in a nonspecific highly generalized pattern. There is no significant leukoaraiosis, only some very minor endcap areas in the occipital horns. I also took the opportunity to clarify with him his academic history. He stated that he did have a learning issue and problems reading and was in remedial instruction for 2 years in elementary school. 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   Time was spent reviewing the impressions and recommendations that are detailed in the evaluation report. In his case, I do think that his findings warrant a diagnosis of mild neurocognitive disorder in the context of his clinical history, although I am not at all certain that this represents a prodromal degenerative condition. He could have MCI due to minor causes including depression. He also is aging in the setting of a learning disability. He has some lifestyle risk factors in terms of his alcohol intake. Nevertheless, there is some potential that he is at risk for decline, and I counseled him on lifestyle changes that may be helpful, as reflected in the patient instructions. I took time to explain the findings and answer all the patient's questions. I encouraged Jonathon Parker to contact me should he have any further questions or if further follow up is desired.   Current Medications and Medical History   Current Outpatient Medications  Medication Sig Dispense Refill  . albuterol (VENTOLIN HFA) 108 (90 Base) MCG/ACT inhaler Inhale 2 puffs into the lungs every 6 (six) hours as needed for wheezing or shortness of breath. 18 g 5   . ALPRAZolam (XANAX) 0.25 MG tablet Take 0.25 mg by mouth at bedtime as needed for anxiety. (Patient not taking: No sig reported)    . budesonide-formoterol (SYMBICORT) 80-4.5 MCG/ACT inhaler Inhale 2 puffs into the lungs in the morning and at bedtime. 1 Inhaler 5  . buPROPion (WELLBUTRIN XL) 300 MG 24 hr tablet Take 300 mg by mouth daily.     Marland Kitchen docusate sodium (COLACE) 100 MG capsule Take 1 capsule (100 mg total) by mouth 2 (two) times daily. 60 capsule 0  . donepezil (ARICEPT) 5 MG tablet Take 5 mg by mouth daily.    Marland Kitchen esomeprazole (NEXIUM) 40 MG capsule Take 40 mg by mouth daily.     Marland Kitchen ibuprofen (ADVIL) 400 MG tablet Take 400 mg by mouth every 6 (six) hours as needed for fever, headache or moderate pain. (Patient not taking: No sig reported)    . lisinopril (ZESTRIL) 20 MG tablet Take 20 mg by mouth at bedtime.     . polyethylene glycol powder (GLYCOLAX/MIRALAX) 17 GM/SCOOP powder Take 1 capful of powder mixed in drink by mouth 1 to 3 times daily Until daily soft stools  OTC 225 g 0  . sertraline (ZOLOFT) 50 MG tablet Take 25 mg by mouth daily.    . traZODone (DESYREL) 100 MG tablet Take 100 mg by mouth at bedtime.     No current facility-administered medications for this visit.    Patient Active Problem List   Diagnosis Date Noted  . Mild intermittent asthma 01/28/2020  . Dyspnea 05/11/2019  . Thrombocytosis 05/11/2019  . Alcohol dependence ([place]) 05/11/2019  . Abnormal  findings on diagnostic imaging of gall bladder 05/10/2019  . Essential hypertension 04/22/2019  . Post-operative complication 57/09/2024  . Acute respiratory failure with hypoxia ([place]) 04/21/2019  . SIRS (systemic inflammatory response syndrome) ([place]) 04/21/2019  . Status post laparoscopic cholecystectomy 04/21/2019  . HCAP (healthcare-associated pneumonia) 04/21/2019  . CLL (chronic lymphocytic leukemia) ([place]) 04/21/2019  . Calculous cholecystitis 02/28/2019  . Campylobacter diarrhea   . Diarrhea of infectious  origin   . STEC (Shiga toxin-producing Escherichia coli)   . Elevated LFTs   . Hypokalemia   . Gastroenteritis 01/19/2017  . Sepsis ([place]) 01/19/2017  . GERD (gastroesophageal reflux disease) 01/19/2017  . Depression 01/19/2017    Mental Status and Behavioral Observations  Jonathon Parker presented on time to the present encounter and was alert and generally oriented. Speech was normal in rate, rhythm, volume, and prosody. Self-reported mood was "allright" and affect was somewhat dysphoric, I think it is hard for him to discuss his cognitive problems. Thought process was logical and goal oriented (although he completed of difficulty focusing during the session) and thought content was appropriate to the evaluative context. There were no safety concerns identified at today's encounter, such as thoughts of harming self or others.   Plan  Feedback provided regarding the patient's neuropsychological evaluation. He has MCI, although it is unclear to me whether he is at risk for decline or this is due to less insidious factors such as aging in the setting of learning problems, depression, excessive alcohol use. His MRI was nondiagnostic but does rule out the notion that vascular disease is playing much of a role here to my eye. Jonathon Parker [facility] was encouraged to contact me if any questions arise or if further follow up is desired.   Jonathon Parker Jonathon Kindred, PsyD, ABN Clinical Neuropsychologist  Service(s) Provided at This Encounter: 47 minutes [phone]; Conjoint therapy with patient present)

## 2020-09-13 NOTE — Patient Instructions (Signed)
Your performance and presentation on neuropsychological assessment were consistent with some mild diminishment that I would sum up as reflecting reduced cognitive efficiency. This includes things such as memory encoding (I.e., getting new memories in), processing speed, attending to information, and working memory (I.e., holding information on line to do something with it). My sense is that these findings likely due represent a decline for you and they map on well to your day-to-day report of problems. 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   The major difference between mild cognitive impairment (MCI) and dementia is in severity and potential prognosis. Once someone reaches a level of severity adequate to be diagnosed with a dementia, there is usually progression over time, though this may be years. On the other hand, mild cognitive impairment, while a significant risk for dementia in future, does not always progress to dementia, and in some instances stays the same or can even revert to normal.It is important to realize that if MCI is due to underlying Alzheimer's disease, it will most likely progress to dementia eventually. The rate of conversion to Alzheimer's dementiafrom amnestic MCI is about 15% per year versus the general population risk of conversion of 2% per year.  In your case, I am not entirely sure what is causing your mild cognitive impairment and I do not think it is necessarily Alzheimer's disease. Your profile of test findings is not what I expect to see in Alzheimer's disease because of the type of memory problems you had. You also had good scores on measures of language that tend to decline in AD type presentations. One possibility is that this reflects cumulative effects of alcohol on the brain, because 3 or more drinks a day is perhaps enough to cause some cognitive problems. This is  a weak explanation however, and there is also the possibility that your test scores are due to a declining condition. Other possibilities are that this reflects aging in the setting of a learning disorder, interference due to depression, or other reversible causes.   We reviewed your MRI of the brain and I did agree that there is a bit more volume loss than might be expected in someone your age but it is not very impressive and it is generalized, there is no focality. There is not much in the way of vascular disease. This rules out vascular disease as a likely cause but does not further refine the cause of your mild cognitive difficulties.   Regardless of the cause the treatment of choice remains similar, and that is to engage in healthy lifestyle changes. This includes treating mood symptoms, getting recommended amounts of sleep, diet, and exercise.   Most drinkers are aware that drinking alcohol is not healthy in excess and so I will not belabor that point, but I will let you know that at 2 drinks, alcohol looses any health benefits and at 3 drinks, it is likely doing more harm than good. This is particularly true in the elderly who often do not tolerate alcohol well. I would therefore suggest that you either stop drinking or limit your intake to no more than 1 standard beverage per day.   There is now good quality evidence from at least one large scale study that a modified mediterranean diet may help slow cognitive decline. This is known as the "MIND" diet. The Mind diet is not so much a specific diet as it is  a set of recommendations for things that you should and should not eat.   Foods that are ENCOURAGED on the MIND Diet:  Green, leafy vegetables: Aim for six or more servings per week. This includes kale, spinach, cooked greens and salads.  All other vegetables: Try to eat another vegetable in addition to the green leafy vegetables at least once a day. It is best to choose non-starchy vegetables  because they have a lot of nutrients with a low number of calories.  Berries: Eat berries at least twice a week. There is a plethora of research on strawberries, and other berries such as blueberries, raspberries and blackberries have also been found to have antioxidant and brain health benefits.  Nuts: Try to get five servings of nuts or more each week. The creators of the [place] don't specify what kind of nuts to consume, but it is probably best to vary the type of nuts you eat to obtain a variety of nutrients. Peanuts are a legume and do not fall into this category.  Olive oil: Use olive oil as your main cooking oil. There may be other heart-healthy alternatives such as algae oil, though there is not yet sufficient research upon which to base a formal recommendation.  Whole grains: Aim for at least three servings daily. Choose minimally processed grains like oatmeal, quinoa, brown rice, whole-wheat pasta and 100% whole-wheat bread.  Fish: Eat fish at least once a week. It is best to choose fatty fish like salmon, sardines, trout, tuna and mackerel for their high amounts of omega-3 fatty acids.  Beans: Include beans in at least four meals every week. This includes all beans, lentils and soybeans.  Poultry: Try to eat chicken or [place] at least twice a week. Note that fried chicken is not encouraged on the MIND diet.  Wine: Aim for no more than one glass of alcohol daily. Both red and white wine may benefit the brain. However, much research has focused on the red wine compound resveratrol, which may help protect against Alzheimer's disease.  Foods that are DISCOURAGED on the MIND Diet: Butter and margarine: Try to eat less than 1 tablespoon (about 14 grams) daily. Instead, try using olive oil as your primary cooking fat, and dipping your bread in olive oil with herbs.  Cheese: The MIND diet recommends limiting your cheese consumption to less than once per week.  Red meat: Aim for no more than three  servings each week. This includes all beef, pork, lamb and products made from these meats.  [place] food: The MIND diet highly discourages fried food, especially the kind from fast-food restaurants. Limit your consumption to less than once per week.  Pastries and sweets: This includes most of the processed junk food and desserts you can think of. Ice cream, cookies, brownies, snack cakes, donuts, candy and more. Try to limit these to no more than four times a week.  Exercise is one of the best medicines for promoting health and maintaining cognitive fitness at all stages in life. Exercise probably has the largest documented effect on brain health and performance of any lifestyle intervention. Studies have shown that even previously sedentary individuals who start exercising as late as age 47 show a significant survival benefit as compared to their non-exercising peers. In the [place], the current guidelines are for 30 minutes of moderate exercise per day, but increasing your activity level less than that may also be helpful. You do not have to get your 30 minutes of  exercise in one shot and exercising for short periods of time spread throughout the day can be helpful. Go for several walks, learn to dance, or do something else you enjoy that gets your body moving. Of course, if you have an underlying medical condition or there is any question about whether it is safe for you to exercise, you should consult a medical treatment provider prior to beginning exercise.

## 2020-09-21 DIAGNOSIS — X32XXXA Exposure to sunlight, initial encounter: Secondary | ICD-10-CM | POA: Diagnosis not present

## 2020-09-21 DIAGNOSIS — C4441 Basal cell carcinoma of skin of scalp and neck: Secondary | ICD-10-CM | POA: Diagnosis not present

## 2020-09-21 DIAGNOSIS — L570 Actinic keratosis: Secondary | ICD-10-CM | POA: Diagnosis not present

## 2020-09-21 DIAGNOSIS — D485 Neoplasm of uncertain behavior of skin: Secondary | ICD-10-CM | POA: Diagnosis not present

## 2020-10-12 DIAGNOSIS — C4441 Basal cell carcinoma of skin of scalp and neck: Secondary | ICD-10-CM | POA: Diagnosis not present

## 2020-10-19 NOTE — Progress Notes (Signed)
[facility]  [address] [place],  [place]  [address] [phone]  Clinic Day:  10/20/2020  Referring physician: Nicoletta Dress, MD  This document serves as a record of services personally performed by Hosie Poisson, MD. It was created on their behalf by [facility] E, a trained medical scribe. The creation of this record is based on the scribe's personal observations and the provider's statements to them.  CHIEF COMPLAINT:  CC: Chronic lymphocytic leukemia  Current Treatment:  Observation   HISTORY OF PRESENT ILLNESS:  Jonathon Parker is a 70 y.o. male with chronic lymphocytic leukemia diagnosed in October 2019. He was referred for leukocytosis/lymphocytosis.  His only complaint was fatigue, as well as intermittent adenopathy of his neck.  There was no anemia, thrombocytopenia or splenomegaly.  The white count was 14,000 with 22% neutrophils and 74% lymphocytes.  Hemoglobin was normal at 14.1 and platelet count was 158,000.  Flow cytometry, which confirmed the diagnosis of chronic lymphocytic leukemia.  In January 2020, the white count had jumped from 17 to 45.6 with 90% lymphocytes, but the hemoglobin and platelet count remained normal.  His absolute lymphocyte count had increased from 14,000 to 38,000. This was felt to be due to acute bronchitis he had at the time and he had no evidence of adenopathy or splenomegaly.  He was seen again in March and the white count had improved to 26.3, but still 90% lymphocytes.  The hemoglobin and platelets remained normal. By June of 2020, his white count was down to 15.6 with 56% lymphocytes.  In October, his white count improved to 10.5 with 65.5% lymphocytes.  He underwent a cholecystectomy on September 15th.  Post op he ended up with an abscess and required drains.  Head MRI from August 2021 revealed no acute intracranial abnormality, but did show dvanced atrophy for age. No specific lobar  predilection.  INTERVAL HISTORY:  Jonathon Parker is here for routine follow up and states that he has been well and denies complaints other than arthralgias.  He has been placed on oral vitamin D daily.  His white count has decreased from 25.7 to 22.5 with an ALC of 18.45, and his hemoglobin and platelets are normal.  His  appetite is good, and he has gained 5 pounds since his last visit.  He denies fever, chills or other signs of infection.  He denies nausea, vomiting, bowel issues, or abdominal pain.  He denies sore throat, cough, dyspnea, or chest pain.  REVIEW OF SYSTEMS:  Review of Systems  Constitutional: Negative.  Negative for appetite change, chills, fatigue, fever and unexpected weight change.  HENT:  Negative.   Eyes: Negative.   Respiratory: Negative.  Negative for chest tightness, cough, hemoptysis, shortness of breath and wheezing.   Cardiovascular: Negative.  Negative for chest pain, leg swelling and palpitations.  Gastrointestinal: Negative.  Negative for abdominal distention, abdominal pain, blood in stool, constipation, diarrhea, nausea and vomiting.  Endocrine: Negative.   Genitourinary: Negative.  Negative for difficulty urinating, dysuria, frequency and hematuria.   Musculoskeletal: Positive for arthralgias. Negative for back pain, flank pain, gait problem and myalgias.  Skin: Negative.   Neurological: Negative.  Negative for dizziness, extremity weakness, gait problem, headaches, light-headedness, numbness, seizures and speech difficulty.  Hematological: Negative.   Psychiatric/Behavioral: Negative.  Negative for depression and sleep disturbance. The patient is not nervous/anxious.   All other systems reviewed and are negative.    VITALS:  Blood pressure (!) 183/93, pulse 62, temperature 98.2  F (36.8 C), temperature source Oral, resp. rate 18, height 6' (1.829 m), weight 205 lb 4.8 oz (93.1 kg), SpO2 96 %.  Wt Readings from Last 3 Encounters:  10/20/20 205 lb 4.8 oz (93.1  kg)  07/18/20 202 lb (91.6 kg)  06/22/20 198 lb 1.6 oz (89.9 kg)    Body mass index is 27.84 kg/m.  Performance status (ECOG): 0 - Asymptomatic  PHYSICAL EXAM:  Physical Exam Constitutional:      General: He is not in acute distress.    Appearance: Normal appearance. He is normal weight.  HENT:     Head: Normocephalic and atraumatic.  Eyes:     General: No scleral icterus.    Extraocular Movements: Extraocular movements intact.     Conjunctiva/sclera: Conjunctivae normal.     Pupils: Pupils are equal, round, and reactive to light.  Cardiovascular:     Rate and Rhythm: Normal rate and regular rhythm.     Pulses: Normal pulses.     Heart sounds: Normal heart sounds. No murmur heard. No friction rub. No gallop.   Pulmonary:     Effort: Pulmonary effort is normal. No respiratory distress.     Breath sounds: Normal breath sounds.  Abdominal:     General: Bowel sounds are normal. There is no distension.     Palpations: Abdomen is soft. There is no hepatomegaly, splenomegaly or mass.     Tenderness: There is no abdominal tenderness.  Musculoskeletal:        General: Normal range of motion.     Cervical back: Normal range of motion and neck supple.     Right lower leg: No edema.     Left lower leg: No edema.  Lymphadenopathy:     Cervical: No cervical adenopathy.  Skin:    General: Skin is warm and dry.  Neurological:     General: No focal deficit present.     Mental Status: He is alert and oriented to person, place, and time. Mental status is at baseline.  Psychiatric:        Mood and Affect: Mood normal.        Behavior: Behavior normal.        Thought Content: Thought content normal.        Judgment: Judgment normal.     LABS:   CBC Latest Ref Rng & Units 06/22/2020 05/13/2019 05/12/2019  WBC - 25.7 23.2(H) 23.8(H)  Hemoglobin 13.5 - 17.5 15.7 10.8(L) 11.4(L)  Hematocrit 41 - 53 47 35.5(L) 36.3(L)  Platelets 150 - 399 188 426(H) 434(H)   CMP Latest Ref Rng &  Units 06/22/2020 05/13/2019 05/12/2019  Glucose 70 - 99 mg/dL - 95 100(H)  BUN 4 - 21 16 11 13   Creatinine 0.6 - 1.3 1.2 0.89 0.91  Sodium 137 - 147 140 139 136  Potassium 3.4 - 5.3 4.4 3.7 3.6  Chloride 99 - 108 102 103 101  CO2 13 - 22 26(A) 23 30  Calcium 8.7 - 10.7 9.3 8.6(L) 8.7(L)  Total Protein 6.5 - 8.1 g/dL - - 6.6  Total Bilirubin 0.3 - 1.2 mg/dL - - 0.5  Alkaline Phos 25 - 125 78 - 267(H)  AST 14 - 40 28 - 16  ALT 10 - 40 17 - 38     No results found for: LDH   STUDIES:   EXAM: 04/03/2020 MRI HEAD WITHOUT CONTRAST  TECHNIQUE: Multiplanar, multiecho pulse sequences of the brain and surrounding structures were obtained without intravenous contrast.  COMPARISON:  None.  FINDINGS: Brain: No acute infarct, acute hemorrhage or extra-axial collection. Normal white matter signal. Advanced atrophy for age. No chronic microhemorrhage. Normal midline structures.  Vascular: Normal flow voids.  Skull and upper cervical spine: Normal marrow signal.  Sinuses/Orbits: Negative.  Other: None.  IMPRESSION: 1. No acute intracranial abnormality. 2. Advanced atrophy for age. No specific lobar predilection. Quantitative, volumetric MRI of the brain may be helpful for more specific evaluation for characteristic atrophy patterns associated with dementia.   Allergies: No Known Allergies  Current Medications: Current Outpatient Medications  Medication Sig Dispense Refill  . cholecalciferol (VITAMIN D3) 25 MCG (1000 UNIT) tablet Take 2,000 Units by mouth daily. Takes 2 tablets (1,000 units each) for a total of 2,000 units daily    . albuterol (VENTOLIN HFA) 108 (90 Base) MCG/ACT inhaler Inhale 2 puffs into the lungs every 6 (six) hours as needed for wheezing or shortness of breath. 18 g 5  . ALPRAZolam (XANAX) 0.25 MG tablet Take 0.25 mg by mouth at bedtime as needed for anxiety. (Patient not taking: No sig reported)    . budesonide-formoterol (SYMBICORT) 80-4.5 MCG/ACT  inhaler Inhale 2 puffs into the lungs in the morning and at bedtime. 1 Inhaler 5  . buPROPion (WELLBUTRIN XL) 300 MG 24 hr tablet Take 300 mg by mouth daily.     Marland Kitchen docusate sodium (COLACE) 100 MG capsule Take 1 capsule (100 mg total) by mouth 2 (two) times daily. 60 capsule 0  . donepezil (ARICEPT) 5 MG tablet Take 5 mg by mouth daily.    Marland Kitchen esomeprazole (NEXIUM) 40 MG capsule Take 40 mg by mouth daily.     Marland Kitchen ibuprofen (ADVIL) 400 MG tablet Take 400 mg by mouth every 6 (six) hours as needed for fever, headache or moderate pain. (Patient not taking: No sig reported)    . lisinopril (ZESTRIL) 20 MG tablet Take 20 mg by mouth at bedtime.     . polyethylene glycol powder (GLYCOLAX/MIRALAX) 17 GM/SCOOP powder Take 1 capful of powder mixed in drink by mouth 1 to 3 times daily Until daily soft stools  OTC 225 g 0  . sertraline (ZOLOFT) 50 MG tablet Take 25 mg by mouth daily.    . traZODone (DESYREL) 100 MG tablet Take 100 mg by mouth at bedtime.     No current facility-administered medications for this visit.     ASSESSMENT & PLAN:   Assessment:   1. Chronic lymphocytic leukemia, diagnosed in October 2019.  He remains in stage 0.  His leukocytosis/lymphocytosis has slowly increased, but has decreased today. He does not have significant lymphadenopathy or cytopenias, so we will continue observation.  2.  Worsening memory.  MRI head from August 2021 revealed no acute intracranial abnormality.  However, there was advanced atrophy for age, but no specific lobar predilection.  Plan: We will plan to see him back in 6 months with a CBC and comprehensive metabolic panel.  The patient and his wife understand the plans discussed today and are in agreement with them.  They know to contact our office if he develops concerns prior to his next appointment.   I provided 15 minutes of face-to-face time during this this encounter and > 50% was spent counseling as documented under my assessment and plan.     Derwood Kaplan, MD [facility] [address] [place] [address] Dept: [phone] Dept Fax: [phone]   I, Rita Ohara, am acting as scribe  for Derwood Kaplan, MD  I have reviewed this report as typed by the medical scribe, and it is complete and accurate.  Hermina Barters

## 2020-10-20 ENCOUNTER — Other Ambulatory Visit: Payer: Self-pay | Admitting: Oncology

## 2020-10-20 ENCOUNTER — Encounter: Payer: Self-pay | Admitting: Oncology

## 2020-10-20 ENCOUNTER — Inpatient Hospital Stay: Payer: Medicare PPO

## 2020-10-20 ENCOUNTER — Other Ambulatory Visit: Payer: Self-pay | Admitting: Hematology and Oncology

## 2020-10-20 ENCOUNTER — Other Ambulatory Visit: Payer: Self-pay

## 2020-10-20 ENCOUNTER — Inpatient Hospital Stay: Payer: Medicare PPO | Attending: Oncology | Admitting: Oncology

## 2020-10-20 VITALS — BP 183/93 | HR 62 | Temp 98.2°F | Resp 18 | Ht 72.0 in | Wt 205.3 lb

## 2020-10-20 DIAGNOSIS — D75839 Thrombocytosis, unspecified: Secondary | ICD-10-CM | POA: Diagnosis not present

## 2020-10-20 DIAGNOSIS — C9110 Chronic lymphocytic leukemia of B-cell type not having achieved remission: Secondary | ICD-10-CM

## 2020-10-20 LAB — CBC
Absolute Lymphocytes: 18.45 — AB (ref 0.65–4.75)
MCV: 99 — AB (ref 80–94)
RBC: 4.86 (ref 3.87–5.11)

## 2020-10-20 LAB — CBC AND DIFFERENTIAL
HCT: 48 (ref 41–53)
Hemoglobin: 16.1 (ref 13.5–17.5)
Neutrophils Absolute: 3.6
Platelets: 152 (ref 150–399)
WBC: 22.5

## 2020-10-26 DIAGNOSIS — I10 Essential (primary) hypertension: Secondary | ICD-10-CM | POA: Diagnosis not present

## 2020-10-30 DIAGNOSIS — I10 Essential (primary) hypertension: Secondary | ICD-10-CM | POA: Diagnosis not present

## 2020-11-13 DIAGNOSIS — I10 Essential (primary) hypertension: Secondary | ICD-10-CM | POA: Diagnosis not present

## 2020-11-24 ENCOUNTER — Inpatient Hospital Stay (HOSPITAL_COMMUNITY): Payer: Medicare PPO

## 2020-11-24 ENCOUNTER — Emergency Department (HOSPITAL_COMMUNITY): Payer: Medicare PPO

## 2020-11-24 ENCOUNTER — Encounter (HOSPITAL_COMMUNITY): Payer: Self-pay | Admitting: Neurology

## 2020-11-24 ENCOUNTER — Other Ambulatory Visit: Payer: Self-pay

## 2020-11-24 ENCOUNTER — Inpatient Hospital Stay (HOSPITAL_COMMUNITY)
Admission: EM | Admit: 2020-11-24 | Discharge: 2020-11-26 | DRG: 062 | Disposition: A | Discharge status: Home / Self Care | Payer: Medicare PPO | Attending: Neurology | Admitting: Neurology

## 2020-11-24 DIAGNOSIS — F1020 Alcohol dependence, uncomplicated: Secondary | ICD-10-CM | POA: Diagnosis present

## 2020-11-24 DIAGNOSIS — G4700 Insomnia, unspecified: Secondary | ICD-10-CM | POA: Diagnosis present

## 2020-11-24 DIAGNOSIS — Z888 Allergy status to other drugs, medicaments and biological substances status: Secondary | ICD-10-CM

## 2020-11-24 DIAGNOSIS — R531 Weakness: Secondary | ICD-10-CM | POA: Diagnosis not present

## 2020-11-24 DIAGNOSIS — G3184 Mild cognitive impairment, so stated: Secondary | ICD-10-CM | POA: Diagnosis present

## 2020-11-24 DIAGNOSIS — R29898 Other symptoms and signs involving the musculoskeletal system: Secondary | ICD-10-CM | POA: Diagnosis not present

## 2020-11-24 DIAGNOSIS — R0902 Hypoxemia: Secondary | ICD-10-CM | POA: Diagnosis not present

## 2020-11-24 DIAGNOSIS — J9601 Acute respiratory failure with hypoxia: Secondary | ICD-10-CM | POA: Diagnosis not present

## 2020-11-24 DIAGNOSIS — F419 Anxiety disorder, unspecified: Secondary | ICD-10-CM | POA: Diagnosis present

## 2020-11-24 DIAGNOSIS — Z8701 Personal history of pneumonia (recurrent): Secondary | ICD-10-CM | POA: Diagnosis not present

## 2020-11-24 DIAGNOSIS — I10 Essential (primary) hypertension: Secondary | ICD-10-CM | POA: Diagnosis present

## 2020-11-24 DIAGNOSIS — Z20822 Contact with and (suspected) exposure to covid-19: Secondary | ICD-10-CM | POA: Diagnosis present

## 2020-11-24 DIAGNOSIS — Z7951 Long term (current) use of inhaled steroids: Secondary | ICD-10-CM | POA: Diagnosis not present

## 2020-11-24 DIAGNOSIS — K219 Gastro-esophageal reflux disease without esophagitis: Secondary | ICD-10-CM | POA: Diagnosis present

## 2020-11-24 DIAGNOSIS — R231 Pallor: Secondary | ICD-10-CM | POA: Diagnosis not present

## 2020-11-24 DIAGNOSIS — R200 Anesthesia of skin: Secondary | ICD-10-CM | POA: Diagnosis not present

## 2020-11-24 DIAGNOSIS — I6381 Other cerebral infarction due to occlusion or stenosis of small artery: Secondary | ICD-10-CM | POA: Diagnosis not present

## 2020-11-24 DIAGNOSIS — I639 Cerebral infarction, unspecified: Secondary | ICD-10-CM

## 2020-11-24 DIAGNOSIS — Z79899 Other long term (current) drug therapy: Secondary | ICD-10-CM

## 2020-11-24 DIAGNOSIS — F32A Depression, unspecified: Secondary | ICD-10-CM | POA: Diagnosis present

## 2020-11-24 DIAGNOSIS — G8194 Hemiplegia, unspecified affecting left nondominant side: Secondary | ICD-10-CM | POA: Diagnosis not present

## 2020-11-24 DIAGNOSIS — R29704 NIHSS score 4: Secondary | ICD-10-CM | POA: Diagnosis not present

## 2020-11-24 DIAGNOSIS — C9110 Chronic lymphocytic leukemia of B-cell type not having achieved remission: Secondary | ICD-10-CM | POA: Diagnosis not present

## 2020-11-24 DIAGNOSIS — J45909 Unspecified asthma, uncomplicated: Secondary | ICD-10-CM | POA: Diagnosis present

## 2020-11-24 DIAGNOSIS — I63211 Cerebral infarction due to unspecified occlusion or stenosis of right vertebral arteries: Secondary | ICD-10-CM | POA: Diagnosis present

## 2020-11-24 DIAGNOSIS — G319 Degenerative disease of nervous system, unspecified: Secondary | ICD-10-CM | POA: Diagnosis not present

## 2020-11-24 LAB — ECHOCARDIOGRAM COMPLETE
Area-P 1/2: 2.83 cm2
Height: 72 in
P 1/2 time: 554 msec
S' Lateral: 2.8 cm
Weight: 3185.21 oz

## 2020-11-24 LAB — COMPREHENSIVE METABOLIC PANEL
ALT: 19 U/L (ref 0–44)
AST: 33 U/L (ref 15–41)
Albumin: 4.1 g/dL (ref 3.5–5.0)
Alkaline Phosphatase: 92 U/L (ref 38–126)
Anion gap: 8 (ref 5–15)
BUN: 13 mg/dL (ref 8–23)
CO2: 25 mmol/L (ref 22–32)
Calcium: 8.9 mg/dL (ref 8.9–10.3)
Chloride: 104 mmol/L (ref 98–111)
Creatinine, Ser: 1.3 mg/dL — ABNORMAL HIGH (ref 0.61–1.24)
GFR, Estimated: 59 mL/min — ABNORMAL LOW (ref >60)
Glucose, Bld: 183 mg/dL — ABNORMAL HIGH (ref 70–99)
Potassium: 5.9 mmol/L — ABNORMAL HIGH (ref 3.5–5.1)
Sodium: 137 mmol/L (ref 135–145)
Total Bilirubin: 1.5 mg/dL — ABNORMAL HIGH (ref 0.3–1.2)
Total Protein: 6.5 g/dL (ref 6.5–8.1)

## 2020-11-24 LAB — POCT I-STAT, CHEM 8
BUN: 16 mg/dL (ref 8–23)
Calcium, Ion: 0.85 mmol/L — CL (ref 1.15–1.40)
Chloride: 104 mmol/L (ref 98–111)
Creatinine, Ser: 1.2 mg/dL (ref 0.61–1.24)
Glucose, Bld: 176 mg/dL — ABNORMAL HIGH (ref 70–99)
HCT: 51 % (ref 39.0–52.0)
Hemoglobin: 17.3 g/dL — ABNORMAL HIGH (ref 13.0–17.0)
Potassium: 4.6 mmol/L (ref 3.5–5.1)
Sodium: 137 mmol/L (ref 135–145)
TCO2: 25 mmol/L (ref 22–32)

## 2020-11-24 LAB — CBG MONITORING, ED: Glucose-Capillary: 190 mg/dL — ABNORMAL HIGH (ref 70–99)

## 2020-11-24 LAB — DIFFERENTIAL
Abs Immature Granulocytes: 0 10*3/uL (ref 0.00–0.07)
Basophils Absolute: 0.7 10*3/uL — ABNORMAL HIGH (ref 0.0–0.1)
Basophils Relative: 2 %
Eosinophils Absolute: 0 10*3/uL (ref 0.0–0.5)
Eosinophils Relative: 0 %
Lymphocytes Relative: 56 %
Lymphs Abs: 18.6 10*3/uL — ABNORMAL HIGH (ref 0.7–4.0)
Monocytes Absolute: 0.3 10*3/uL (ref 0.1–1.0)
Monocytes Relative: 1 %
Neutro Abs: 13.7 10*3/uL — ABNORMAL HIGH (ref 1.7–7.7)
Neutrophils Relative %: 41 %
nRBC: 0 /100 WBC

## 2020-11-24 LAB — CBC
HCT: 51.3 % (ref 39.0–52.0)
Hemoglobin: 16.9 g/dL (ref 13.0–17.0)
MCH: 33.2 pg (ref 26.0–34.0)
MCHC: 32.9 g/dL (ref 30.0–36.0)
MCV: 100.8 fL — ABNORMAL HIGH (ref 80.0–100.0)
Platelets: 193 10*3/uL (ref 150–400)
RBC: 5.09 MIL/uL (ref 4.22–5.81)
RDW: 13.3 % (ref 11.5–15.5)
WBC: 33.3 10*3/uL — ABNORMAL HIGH (ref 4.0–10.5)
nRBC: 0 % (ref 0.0–0.2)

## 2020-11-24 LAB — PROTIME-INR
INR: 1.1 (ref 0.8–1.2)
Prothrombin Time: 13.7 seconds (ref 11.4–15.2)

## 2020-11-24 LAB — MRSA PCR SCREENING: MRSA by PCR: NEGATIVE

## 2020-11-24 LAB — RESP PANEL BY RT-PCR (FLU A&B, COVID) ARPGX2
Influenza A by PCR: NEGATIVE
Influenza B by PCR: NEGATIVE
SARS Coronavirus 2 by RT PCR: NEGATIVE

## 2020-11-24 LAB — APTT: aPTT: 22 seconds — ABNORMAL LOW (ref 24–36)

## 2020-11-24 MED ORDER — ACETAMINOPHEN 325 MG PO TABS: 650.0000 mg | ORAL_TABLET | ORAL | Status: DC | PRN

## 2020-11-24 MED ORDER — SENNOSIDES-DOCUSATE SODIUM 8.6-50 MG PO TABS: 1.0000 | ORAL_TABLET | Freq: Every evening | ORAL | Status: DC | PRN

## 2020-11-24 MED ORDER — STROKE: EARLY STAGES OF RECOVERY BOOK
Freq: Once | Status: AC
  Filled 2020-11-24: qty 1

## 2020-11-24 MED ORDER — TRAZODONE HCL 100 MG PO TABS
100.0000 mg | ORAL_TABLET | Freq: Every day | ORAL | Status: DC
  Administered 2020-11-24 – 2020-11-25 (×2): 100 mg via ORAL
  Filled 2020-11-24: qty 1
  Filled 2020-11-24: qty 2

## 2020-11-24 MED ORDER — PANTOPRAZOLE SODIUM 40 MG IV SOLR
40.0000 mg | Freq: Every day | INTRAVENOUS | Status: DC
  Administered 2020-11-24 – 2020-11-25 (×2): 40 mg via INTRAVENOUS
  Filled 2020-11-24 (×2): qty 40

## 2020-11-24 MED ORDER — SODIUM CHLORIDE 0.9 % IV SOLN: INTRAVENOUS | Status: DC

## 2020-11-24 MED ORDER — ACETAMINOPHEN 160 MG/5ML PO SOLN: 650.0000 mg | ORAL | Status: DC | PRN

## 2020-11-24 MED ORDER — ACETAMINOPHEN 650 MG RE SUPP: 650.0000 mg | RECTAL | Status: DC | PRN

## 2020-11-24 MED ORDER — SODIUM CHLORIDE 0.9% FLUSH
3.0000 mL | Freq: Once | INTRAVENOUS | Status: AC
Start: 2020-11-24 — End: 2020-11-24
  Administered 2020-11-24: 3 mL via INTRAVENOUS

## 2020-11-24 MED ORDER — SODIUM CHLORIDE 0.9 % IV SOLN
50.0000 mL | Freq: Once | INTRAVENOUS | Status: AC
  Administered 2020-11-24: 50 mL via INTRAVENOUS

## 2020-11-24 MED ORDER — ALTEPLASE (STROKE) FULL DOSE INFUSION
0.9000 mg/kg | Freq: Once | INTRAVENOUS | Status: AC
  Administered 2020-11-24: 82.8 mg via INTRAVENOUS
  Filled 2020-11-24: qty 100

## 2020-11-24 MED ORDER — IOHEXOL 350 MG/ML SOLN
80.0000 mL | Freq: Once | INTRAVENOUS | Status: AC | PRN
  Administered 2020-11-24: 80 mL via INTRAVENOUS

## 2020-11-24 NOTE — Progress Notes (Signed)
Pharmacist Code Stroke Response  Notified to mix tPA at 1250 by Dr. Curly Shores Delivered tPA to RN at 1254  tPA dose = 8.3mg  bolus over 1 minute followed by 74.5mg  for a total dose of 82.8mg  over 1 hour  Issues/delays encountered (if applicable): N/A  Georgene Kopper, Rande Lawman 11/24/20 12:55 PM

## 2020-11-24 NOTE — ED Triage Notes (Signed)
Pt presents to the ED as a Code Stroke. Pt was sitting in his recliner while watching TV when his left arm went numb and weak that progressed to the whole left side of the body. Pt then drove himself to his doctor's office for a routine doctor appointment for his HTN.  LKW 1000. Pt recently having changes in his BP meds due to increase in hypertension the past 2 months.

## 2020-11-24 NOTE — Code Documentation (Addendum)
Stroke Response Nurse Documentation Code Documentation  Jonathon Parker is a 70 y.o. male arriving to [facility] ED via [facility] on 11/24/2020 with past medical hx of Leukemia, Pneumonia, Sepsis, HTN . Code stroke was activated by EMS. Patient from home where he was LKW at 1000 and now complaining of left sided weakness and sensory loss. Patient was at home reading his emails in his lazy boy when he had a sudden onset of left sided numbness and weakness. After some time of it not going away, patient called EMS and they activated a Code Stroke.   Stroke team at the bedside on patient arrival. Labs drawn and patient cleared for CT by EDP. Patient to CT with team. NIHSS 4, see documentation for details and code stroke times. Patient with left arm weakness, left leg weakness and left decreased sensation on exam. The following imaging was completed:  CT. Patient is a candidate for tPA and it was started in CT. Care/Plan: q15 mNIHSS/VS until 1500, q30 x 8 hrs, and q1 x 16 hours. BP < 180/105. Bedside handoff with ED RN Anda Kraft.    Kathrin Greathouse  Stroke Response RN

## 2020-11-24 NOTE — ED Provider Notes (Signed)
[facility] DEPARTMENT Provider Note   CSN: [id number] Arrival date & time: 11/24/20  1236     History No chief complaint on file.   Jonathon Parker is a 70 y.o. male.  Patient is a 70 year old male with a history of CLL, hypertension, GERD, alcohol dependence and asthma who is presenting today as a code stroke.  Patient reported waking up normal this morning and then around 10 AM he started feeling numbness on the left side of his body.  He had an appointment with his doctor already today and when he got to the office they noted that he was dragging his left leg and having ongoing numbness.  He had no speech changes he was awake and alert.  He denies any chest pain, shortness of breath, cough, abdominal pain or vomiting.  Neurology team was present immediately upon arrival.  Patient went directly to CT scan.  The history is provided by the patient, the EMS personnel and medical records (pcp).       Past Medical History:  Diagnosis Date  . Allergy   . Arthritis    not dx'd  . Asthma    mild   . Cholecystitis   . 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 Karle Starch in [facility]    . Depression   . GERD (gastroesophageal reflux disease)    controlled with nexium   . Hypertension     Patient Active Problem List   Diagnosis Date Noted  . Mild intermittent asthma 01/28/2020  . Dyspnea 05/11/2019  . Thrombocytosis 05/11/2019  . Alcohol dependence ([place]) 05/11/2019  . Abnormal findings on diagnostic imaging of gall bladder 05/10/2019  . Essential hypertension 04/22/2019  . Post-operative complication [id number]  . Acute respiratory failure with hypoxia ([place]) 04/21/2019  . SIRS (systemic inflammatory response syndrome) ([place]) 04/21/2019  . Status post laparoscopic cholecystectomy 04/21/2019  . HCAP (healthcare-associated pneumonia) 04/21/2019  . CLL (chronic lymphocytic leukemia) ([place]) 04/21/2019  . Calculous  cholecystitis 02/28/2019  . Campylobacter diarrhea   . Diarrhea of infectious origin   . STEC (Shiga toxin-producing Escherichia coli)   . Elevated LFTs   . Hypokalemia   . Gastroenteritis 01/19/2017  . Sepsis ([place]) 01/19/2017  . GERD (gastroesophageal reflux disease) 01/19/2017  . Depression 01/19/2017    Past Surgical History:  Procedure Laterality Date  . APPENDECTOMY    . CARPAL TUNNEL RELEASE     left   . CHOLECYSTECTOMY N/A 04/20/2019   Procedure: LAPAROSCOPIC CHOLECYSTECTOMY;  Surgeon: Greer Pickerel, MD;  Location: [facility];  Service: General;  Laterality: N/A;  . COLON SURGERY  2002   10 inches of colon taken out   . COLONOSCOPY    . ENDOSCOPIC RETROGRADE CHOLANGIOPANCREATOGRAPHY (ERCP) WITH PROPOFOL N/A 03/01/2019   Procedure: ENDOSCOPIC RETROGRADE CHOLANGIOPANCREATOGRAPHY (ERCP) WITH PROPOFOL;  Surgeon: Rush Landmark Telford Nab., MD;  Location: [facility];  Service: Gastroenterology;  Laterality: N/A;  . ERCP  03/01/2019  . HERNIA REPAIR     bilateral inguinal   . IR CHOLANGIOGRAM EXISTING TUBE  03/25/2019  . IR PERC CHOLECYSTOSTOMY  03/02/2019  . left heel reconstruction  01/2002   17 screws and 2 plates   . POLYPECTOMY    . REMOVAL OF STONES  03/01/2019   Procedure: REMOVAL OF STONES;  Surgeon: Rush Landmark Telford Nab., MD;  Location: [facility];  Service: Gastroenterology;;  . [name]  03/01/2019   Procedure: [name];  Surgeon: [name], MD;  Location: [facility];  Service: Gastroenterology;;  . tooth extracted     with laughing gas   . UPPER GASTROINTESTINAL ENDOSCOPY         Family History  Problem Relation Age of Onset  . Prostate cancer Brother   . Colon cancer Neg Hx   . Esophageal cancer Neg Hx   . Colon polyps Neg Hx   . Rectal cancer Neg Hx   . Stomach cancer Neg Hx     Social History   Tobacco Use  . Smoking status: Never Smoker  . Smokeless tobacco: Never Used  Vaping Use  . Vaping Use: Never used  Substance Use  Topics  . Alcohol use: Yes    Comment: 1 drink before supper  . Drug use: No    Home Medications Prior to Admission medications   Medication Sig Start Date End Date Taking? Authorizing Provider  albuterol (VENTOLIN HFA) 108 (90 Base) MCG/ACT inhaler Inhale 2 puffs into the lungs every 6 (six) hours as needed for wheezing or shortness of breath. 12/02/19   Byrum, Rose Fillers, MD  ALPRAZolam Duanne Moron) 0.25 MG tablet Take 0.25 mg by mouth at bedtime as needed for anxiety. Patient not taking: No sig reported    [provider]  budesonide-formoterol (SYMBICORT) 80-4.5 MCG/ACT inhaler Inhale 2 puffs into the lungs in the morning and at bedtime. 01/28/20   Collene Gobble, MD  buPROPion (WELLBUTRIN XL) 300 MG 24 hr tablet Take 300 mg by mouth daily.     [provider]  cholecalciferol (VITAMIN D3) 25 MCG (1000 UNIT) tablet Take 2,000 Units by mouth daily. Takes 2 tablets (1,000 units each) for a total of 2,000 units daily    [provider]  docusate sodium (COLACE) 100 MG capsule Take 1 capsule (100 mg total) by mouth 2 (two) times daily. 02/27/20   Little, Wenda Overland, MD  donepezil (ARICEPT) 5 MG tablet Take 5 mg by mouth daily. 05/22/20   [provider]  esomeprazole (NEXIUM) 40 MG capsule Take 40 mg by mouth daily.     [provider]  ibuprofen (ADVIL) 400 MG tablet Take 400 mg by mouth every 6 (six) hours as needed for fever, headache or moderate pain. Patient not taking: No sig reported    [provider]  lisinopril (ZESTRIL) 20 MG tablet Take 20 mg by mouth at bedtime.     [provider]  polyethylene glycol powder (GLYCOLAX/MIRALAX) 17 GM/SCOOP powder Take 1 capful of powder mixed in drink by mouth 1 to 3 times daily Until daily soft stools  OTC 02/27/20   Little, Wenda Overland, MD  sertraline (ZOLOFT) 50 MG tablet Take 25 mg by mouth daily. 04/17/20   [provider]  traZODone (DESYREL) 100 MG tablet Take 100 mg by  mouth at bedtime.    [provider]    Allergies    Patient has no known allergies.  Review of Systems   Review of Systems  All other systems reviewed and are negative.   Physical Exam Updated Vital Signs BP (!) 163/82   Pulse (!) 53   Temp 98.7 F (37.1 C) (Oral)   Resp 16   Ht 6' (1.829 m)   Wt 90.3 kg   SpO2 96%   BMI 27.00 kg/m   Physical Exam Vitals and nursing note reviewed.  Constitutional:      General: He is not in acute distress.    Appearance: He is well-developed.  HENT:     Head: Normocephalic and  atraumatic.     Mouth/Throat:     Mouth: Mucous membranes are moist.  Eyes:     General: No visual field deficit.    Conjunctiva/sclera: Conjunctivae normal.     Pupils: Pupils are equal, round, and reactive to light.  Cardiovascular:     Rate and Rhythm: Normal rate and regular rhythm.     Heart sounds: No murmur heard.   Pulmonary:     Effort: Pulmonary effort is normal. No respiratory distress.     Breath sounds: Normal breath sounds. No wheezing or rales.  Abdominal:     General: There is no distension.     Palpations: Abdomen is soft.     Tenderness: There is no abdominal tenderness. There is no guarding or rebound.  Musculoskeletal:        General: No tenderness. Normal range of motion.     Cervical back: Normal range of motion and neck supple.  Skin:    General: Skin is warm and dry.     Findings: No erythema or rash.  Neurological:     Mental Status: He is alert and oriented to person, place, and time.     Cranial Nerves: No dysarthria or facial asymmetry.     Sensory: Sensory deficit present.     Motor: Pronator drift present.     Comments: Pronator drift noted in the left upper and lower extremity.  Sensation deficit light touch in the left arm and leg.  No facial droop, visual field deficits, aphasia.  Psychiatric:        Behavior: Behavior normal.     ED Results / Procedures / Treatments   Labs (all labs ordered are  listed, but only abnormal results are displayed) Labs Reviewed  APTT - Abnormal; Notable for the following components:      Result Value   aPTT 22 (*)    All other components within normal limits  CBC - Abnormal; Notable for the following components:   WBC 33.3 (*)    MCV 100.8 (*)    All other components within normal limits  CBG MONITORING, ED - Abnormal; Notable for the following components:   Glucose-Capillary 190 (*)    All other components within normal limits  POCT I-STAT, CHEM 8 - Abnormal; Notable for the following components:   Glucose, Bld 176 (*)    Calcium, Ion 0.85 (*)    Hemoglobin 17.3 (*)    All other components within normal limits  PROTIME-INR  DIFFERENTIAL  COMPREHENSIVE METABOLIC PANEL  I-STAT CHEM 8, ED    EKG None  Radiology CT HEAD CODE STROKE WO CONTRAST  Result Date: 11/24/2020 CLINICAL DATA:  Code stroke. Neuro deficit, acute, stroke suspected. Left-sided weakness beginning 2.5 hours ago. EXAM: CT HEAD WITHOUT CONTRAST TECHNIQUE: Contiguous axial images were obtained from the base of the skull through the vertex without intravenous contrast. COMPARISON:  MR head 04/03/2020 FINDINGS: Brain: No acute infarct, hemorrhage, or mass lesion is present. Mild atrophy and white matter changes are stable. Basal ganglia are intact. Cortical ribbon is unremarkable. No acute or focal cortical abnormality is present. The ventricles are proportionate to the degree of atrophy. No significant extraaxial fluid collection is present. The brainstem and cerebellum are within normal limits. Vascular: Atherosclerotic calcifications are present within the cavernous internal carotid arteries. No hyperdense vessel is present. Skull: Calvarium is intact. No focal lytic or blastic lesions are present. No significant extracranial soft tissue lesion is present. Sinuses/Orbits: Minimal mucosal thickening is present the inferior maxillary  sinuses bilaterally. No fluid level is present. The  paranasal sinuses and mastoid air cells are otherwise clear. Other: ASPECTS ([place] Stroke Program Early CT Score) - Ganglionic level infarction (caudate, lentiform nuclei, internal capsule, insula, M1-M3 cortex): 7/7 - Supraganglionic infarction (M4-M6 cortex): 3/3 Total score (0-10 with 10 being normal): 10/10 IMPRESSION: 1. No acute intracranial abnormality or significant interval change. 2. Stable atrophy and white matter disease. 3. Aspects 10/10 Electronically Signed   By: San Morelle M.D.   On: 11/24/2020 12:53    Procedures Procedures   Medications Ordered in ED Medications  sodium chloride flush (NS) 0.9 % injection 3 mL (has no administration in time range)  alteplase (ACTIVASE) 1 mg/mL infusion 82.8 mg (has no administration in time range)    Followed by  0.9 %  sodium chloride infusion (has no administration in time range)    ED Course  I have reviewed the triage vital signs and the nursing notes.  Pertinent labs & imaging results that were available during my care of the patient were reviewed by me and considered in my medical decision making (see chart for details).    MDM Rules/Calculators/A&P                          Patient is a 70 year old gentleman presenting today as a code stroke.  Patient's symptoms started at 10 AM this morning.  He is still having ongoing left-sided deficits.  Neurology is at bedside.  Patient does have a history of CLL and white count of 33 today but not significantly different than prior.  He was otherwise in his normal state of health and does not appear to be septic or having other acute issues.  He was felt to be a tPA candidate as his head CT was negative.  He is getting CTA of the head and neck.  I-STAT Chem-8 showed a blood sugar of 176 and low ionized calcium but hemoglobin was elevated at 17 and electrolytes are within normal limits.  CTA without signficant findings.  BP has remained in the 99991111 systolic.  No progression of symptoms  since arrival.  MDM Number of Diagnoses or Management Options   Amount and/or Complexity of Data Reviewed Clinical lab tests: ordered and reviewed Tests in the radiology section of CPT: ordered and reviewed Decide to obtain previous medical records or to obtain history from someone other than the patient: yes Obtain history from someone other than the patient: yes Review and summarize past medical records: yes Discuss the patient with other providers: yes Independent visualization of images, tracings, or specimens: yes  Risk of Complications, Morbidity, and/or Mortality Presenting problems: high Diagnostic procedures: high Management options: high  Patient Progress Patient progress: stable   CRITICAL CARE Performed by: Aayan Haskew Total critical care time: 30 minutes Critical care time was exclusive of separately billable procedures and treating other patients. Critical care was necessary to treat or prevent imminent or life-threatening deterioration. Critical care was time spent personally by me on the following activities: development of treatment plan with patient and/or surrogate as well as nursing, discussions with consultants, evaluation of patient's response to treatment, examination of patient, obtaining history from patient or surrogate, ordering and performing treatments and interventions, ordering and review of laboratory studies, ordering and review of radiographic studies, pulse oximetry and re-evaluation of patient's condition.  Final Clinical Impression(s) / ED Diagnoses Final diagnoses:  Cerebrovascular accident (CVA), unspecified mechanism ([place])    Rx / DC  Orders ED Discharge Orders    None       Blanchie Dessert, MD 11/25/20 [phone]

## 2020-11-24 NOTE — Progress Notes (Signed)
Dr. Cheral Marker notified that patient states he takes trazodone every night at bedtime, and was requesting a dose tonight. PTA medication lists shows patient takes 100 mg by mouth at bedtime. New verbal orders received to give oral trazodone 100 mg at bedtime. Will continue to monitor.

## 2020-11-24 NOTE — H&P (Signed)
Neurology Consultation Reason for Consult: Code Stroke Requesting Physician: Blanchie Dessert  CC: Left sided weakness and numbness  History is obtained from:patient and patient's son and daughter-in-law  HPI: Jonathon Parker is a 70 y.o. male who reports he was in his normal state of health this morning when he woke up. He reports that at 10 AM while he was sitting his chair at home when he started having Left arm numbness. He then began to have left leg and face numbness. When he got up from the chair he noticed he had weakness in both his arm and leg. He reports that his face felt as if he had received a novocain injection. He then went to his car and noticed he was dragging his left leg a little. He drove to his PCP office and when he arrived there they called EMS. He reports that lately he has had issues controlling his blood pressure and has been going to his PCP about every 2 weeks for medication changes. He reports his systolic blood pressures have been between 160 and 200, associated with headaches when it is high.   On arrival at the ED his BP was 139/94. He reports that he does not take any blood thinner medications, he has not had any blood in his stool or urine, nor any recent procedures or testing other than some blood work in several months. He reported no headache or dizziness. CT showed no LVO. After explaining the risks and benefits of tPA, and confirming there were no contraindications including normal platelet count given his recent downtrending platelets, he consented to it and it was given.  LKW: 1000 tPA given?: Yes, time given - [id number] IA performed?: No, no LVO Premorbid modified rankin scale: 1  ROS: All other review of systems was negative except as noted in the HPI.  Past Medical History:  Diagnosis Date  . Allergy   . Arthritis    not dx'd  . Asthma    mild   . Cholecystitis   . 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 Karle Starch in  [facility] hospital    . Depression   . GERD (gastroesophageal reflux disease)    controlled with nexium   . Hypertension    Past Surgical History:  Procedure Laterality Date  . APPENDECTOMY    . CARPAL TUNNEL RELEASE     left   . CHOLECYSTECTOMY N/A 04/20/2019   Procedure: LAPAROSCOPIC CHOLECYSTECTOMY;  Surgeon: Greer Pickerel, MD;  Location: [facility];  Service: General;  Laterality: N/A;  . COLON SURGERY  2002   10 inches of colon taken out   . COLONOSCOPY    . ENDOSCOPIC RETROGRADE CHOLANGIOPANCREATOGRAPHY (ERCP) WITH PROPOFOL N/A 03/01/2019   Procedure: ENDOSCOPIC RETROGRADE CHOLANGIOPANCREATOGRAPHY (ERCP) WITH PROPOFOL;  Surgeon: Rush Landmark Telford Nab., MD;  Location: [facility];  Service: Gastroenterology;  Laterality: N/A;  . ERCP  03/01/2019  . HERNIA REPAIR     bilateral inguinal   . IR CHOLANGIOGRAM EXISTING TUBE  03/25/2019  . IR PERC CHOLECYSTOSTOMY  03/02/2019  . left heel reconstruction  01/2002   17 screws and 2 plates   . POLYPECTOMY    . REMOVAL OF STONES  03/01/2019   Procedure: REMOVAL OF STONES;  Surgeon: Rush Landmark Telford Nab., MD;  Location: [facility];  Service: Gastroenterology;;  . [name]  03/01/2019   Procedure: [name];  Surgeon: [name], MD;  Location: [facility];  Service: Gastroenterology;;  . tooth extracted  with laughing gas   . UPPER GASTROINTESTINAL ENDOSCOPY     Current Outpatient Medications  Medication Instructions  . albuterol (VENTOLIN HFA) 108 (90 Base) MCG/ACT inhaler 2 puffs, Inhalation, Every 6 hours PRN  . ALPRAZolam (XANAX) 0.25 mg, Oral, At bedtime PRN  . amLODipine (NORVASC) 10 mg, Oral, Daily  . budesonide-formoterol (SYMBICORT) 80-4.5 MCG/ACT inhaler 2 puffs, Inhalation, 2 times daily  . buPROPion (WELLBUTRIN XL) 300 mg, Oral, Daily  . cholecalciferol (VITAMIN D3) 2,000 Units, Oral, Daily, Takes 2 tablets (1,000 units each) for a total of 2,000 units daily  . docusate sodium (COLACE) 100 mg,  Oral, 2 times daily  . donepezil (ARICEPT) 5 mg, Oral, Daily  . esomeprazole (NEXIUM) 40 mg, Oral, Daily  . ibuprofen (ADVIL) 400 mg, Oral, Every 6 hours PRN  . polyethylene glycol powder (GLYCOLAX/MIRALAX) 17 GM/SCOOP powder Take 1 capful of powder mixed in drink by mouth 1 to 3 times daily Until daily soft stools<BR><BR>OTC  . sertraline (ZOLOFT) 25 mg, Oral, Daily  . traZODone (DESYREL) 100 mg, Oral, Daily at bedtime  . valsartan (DIOVAN) 320 mg, Oral, Daily  Xanax use was discussed with patient and family, who reported the patient's last use of Xanax was years ago.   Family History  Problem Relation Age of Onset  . Prostate cancer Brother   . Colon cancer Neg Hx   . Esophageal cancer Neg Hx   . Colon polyps Neg Hx   . Rectal cancer Neg Hx   . Stomach cancer Neg Hx    Social History:  reports that he has never smoked. He has never used smokeless tobacco. He reports current alcohol use. He reports that he does not use drugs.   Exam: Current vital signs: BP (!) 149/88   Pulse 71   Temp 98.2 F (36.8 C) (Oral)   Resp 17   Wt 92 kg   SpO2 98%   BMI 27.51 kg/m  Vital signs in last 24 hours: Temp:  [98.2 F (36.8 C)] 98.2 F (36.8 C) (04/22 1313) Pulse Rate:  [71-84] 71 (04/22 1305) Resp:  [12-20] 17 (04/22 1305) BP: (120-149)/(82-88) 149/88 (04/22 1305) SpO2:  [98 %] 98 % (04/22 1305) Weight:  [92 kg] 92 kg (04/22 1200)   Physical Exam  Constitutional: Appears well-developed and well-nourished.  Psych: Affect appropriate to situation, makes many jokes and is pleasant and cooperative Eyes: No scleral injection HENT: No oropharyngeal obstruction.  MSK: no joint deformities.  Cardiovascular: Normal rate and regular rhythm.  Respiratory: Effort normal, non-labored breathing GI: Soft.  No distension. There is no tenderness.  Skin: Warm dry and intact visible skin  Neuro: Mental Status: Patient is awake, alert, oriented to person, place, month, year, and  situation. Patient is able to give a clear and coherent history. No signs of aphasia or neglect, able to follow all commands Cranial Nerves: II: Visual Fields are full. Pupils are equal, round, and reactive to light.   III,IV, VI: EOMI without ptosis or diploplia.  V: Decreased sensation in Left face VII: Facial movement is symmetric.  VIII: hearing is intact to voice X: Uvula elevates symmetrically XI: Shoulder shrug is symmetric. XII: tongue is midline without atrophy or fasciculations.  Motor: RUE: 5/5  LUE: 4+/5 RLE: 5/5  LLE: 4+/5 Tone is normal. Bulk is normal. Mild drift present in Left arm and leg. No tremor present.  Sensory: Light touch diminished in his Left arm and leg Plantars: Toes are downgoing bilaterally.  Cerebellar: FNF and HKS are  intact bilaterally  NIHSS total 3 Score breakdown:  1a: 0 1b: 0 1c: 0 2: 0 3: 0 4: 0 5a: 1 5b: 0 6a: 1 6b: 0 7: 0 8: 1 9: 0 10: 0 11: 0   I have reviewed labs in epic and the results pertinent to this consultation are: K: 5.9 Cr: 1.3  Baseline ~1.0 WBC: 33.3  (Diagnosed CLL) Platelets are 193  I have reviewed the images obtained: CT Head Code Stroke WO Contrast: No acute intracranial abnormality or significant interval change. Stable atrophy and white matter disease. Aspects 10/10   Impression:  Given his acute focal neurological deficits it is most likely he has had a stroke, localization is potentially right thalamus versus potentially brainstem. CT has ruled out hemorrhage or other acute intracranial process.  Assessment:  Plan:   CNS  Acute Ischemic Stroke Current Suspected Etiology: Small vessel disease based on clinical localization -Admit to: Inpatient (optimist trial candidate if the patient remains stable) -Continue Statin -Hold Aspirin until 24 hour post tPA neuroimaging is stable and without evidence of bleeding -Blood pressure control, goal of SYS < 185 -MRI/ECHO/A1C/Lipid  panel. -Hyperglycemia management per SSI to maintain glucose 140-180mg /dL. -PT/OT/ST therapies and recommendations when able  Hemiparesis following cerebral infarction affecting left non-dominant side  -PT/OT -PM&R consult  Baseline mild cognitive impairment -Monitor for sundowning/delirium  RESP Acute Respiratory Failure  -vent management per ICU -wean when able  CV Essential (primary) hypertension -Aggressive BP control, goal as above -Titrate oral agents -TTE -Continue BB -Cards Consult  Acute MI NSTEMI -Cards consult  Possible hyperlipidemia, unspecified - Statin for goal LDL < 70  HEME  CLL -Continue outpatient management  Coagulopathy secondary to anticoagulation History of thrombocytopenia, not present on admission labs -Bleeding precautions -Continue to monitor  ENDO No known active issues -Point-of-care glucose checks -goal HgbA1c < 7  GI/GU No active issues -Gentle hydration -avoid nephrotoxic agents  Fluid/Electrolyte Disorders Suspect mildly hemolyzed admission CMP given potassium was 5.9 versus 4.6 on i-STAT -Repeat labs  ID No active issues  Nutrition No active issues -diet consult  Prophylaxis DVT: SCDs, subcu heparin may be initiated 24 hours post tPA the patient remained stable GI: Pantoprazole Bowel: Senna  Diet: NPO until cleared by bedside swallow  Code Status: Full Code    THE FOLLOWING WERE PRESENT ON ADMISSION: CNS -  Acute Ischemic Stroke, Hemiparesis, Hemiplegia, Loss of Consciousness, Cerebral Cysts, Benign Intracranial Hypertension Cancer -CLL   Jonathon Mcdaniel MD-PhD [facility] [phone]  Total critical care time: 40 minutes   Critical care time was exclusive of separately billable procedures and treating other patients.   Critical care was necessary to treat or prevent imminent or life-threatening deterioration.   Critical care was time spent personally by me on the following activities:  development of treatment plan with patient and/or surrogate as well as nursing, discussions with consultants/primary team, evaluation of patient's response to treatment, examination of patient, obtaining history from patient or surrogate, ordering and performing treatments and interventions, ordering and review of laboratory studies, ordering and review of radiographic studies, and re-evaluation of patient's condition as needed, as documented above.

## 2020-11-25 ENCOUNTER — Encounter (HOSPITAL_COMMUNITY): Payer: Self-pay | Admitting: Neurology

## 2020-11-25 ENCOUNTER — Inpatient Hospital Stay (HOSPITAL_COMMUNITY): Payer: Medicare PPO

## 2020-11-25 LAB — HEMOGLOBIN A1C
Hgb A1c MFr Bld: 5.6 % (ref 4.8–5.6)
Mean Plasma Glucose: 114.02 mg/dL

## 2020-11-25 LAB — LIPID PANEL
Cholesterol: 141 mg/dL (ref 0–200)
HDL: 30 mg/dL — ABNORMAL LOW (ref >40)
LDL Cholesterol: 87 mg/dL (ref 0–99)
Total CHOL/HDL Ratio: 4.7 RATIO
Triglycerides: 119 mg/dL (ref <150)
VLDL: 24 mg/dL (ref 0–40)

## 2020-11-25 LAB — HIV ANTIBODY (ROUTINE TESTING W REFLEX): HIV Screen 4th Generation wRfx: NONREACTIVE

## 2020-11-25 LAB — TSH: TSH: 2.094 u[IU]/mL (ref 0.350–4.500)

## 2020-11-25 MED ORDER — DONEPEZIL HCL 5 MG PO TABS
5.0000 mg | ORAL_TABLET | Freq: Every day | ORAL | Status: DC
  Administered 2020-11-25 – 2020-11-26 (×2): 5 mg via ORAL
  Filled 2020-11-25 (×2): qty 1

## 2020-11-25 MED ORDER — IRBESARTAN 300 MG PO TABS
300.0000 mg | ORAL_TABLET | Freq: Every day | ORAL | Status: DC
  Administered 2020-11-25 – 2020-11-26 (×2): 300 mg via ORAL
  Filled 2020-11-25: qty 2
  Filled 2020-11-25: qty 1

## 2020-11-25 MED ORDER — CHLORHEXIDINE GLUCONATE CLOTH 2 % EX PADS
6.0000 | MEDICATED_PAD | Freq: Every day | CUTANEOUS | Status: DC
  Administered 2020-11-25 – 2020-11-26 (×2): 6 via TOPICAL

## 2020-11-25 MED ORDER — BUPROPION HCL ER (XL) 150 MG PO TB24
300.0000 mg | ORAL_TABLET | Freq: Every day | ORAL | Status: DC
  Administered 2020-11-25 – 2020-11-26 (×2): 300 mg via ORAL
  Filled 2020-11-25 (×2): qty 2
  Filled 2020-11-25: qty 1

## 2020-11-25 MED ORDER — ALPRAZOLAM 0.25 MG PO TABS: 0.2500 mg | ORAL_TABLET | Freq: Every evening | ORAL | Status: DC | PRN

## 2020-11-25 MED ORDER — AMLODIPINE BESYLATE 10 MG PO TABS
10.0000 mg | ORAL_TABLET | Freq: Every day | ORAL | Status: DC
  Administered 2020-11-25 – 2020-11-26 (×2): 10 mg via ORAL
  Filled 2020-11-25 (×2): qty 1

## 2020-11-25 MED ORDER — MOMETASONE FURO-FORMOTEROL FUM 100-5 MCG/ACT IN AERO
2.0000 | INHALATION_SPRAY | Freq: Two times a day (BID) | RESPIRATORY_TRACT | Status: DC
  Filled 2020-11-25 (×2): qty 8.8

## 2020-11-25 MED ORDER — ASPIRIN EC 81 MG PO TBEC
81.0000 mg | DELAYED_RELEASE_TABLET | Freq: Every day | ORAL | Status: DC
  Administered 2020-11-25 – 2020-11-26 (×2): 81 mg via ORAL
  Filled 2020-11-25 (×2): qty 1

## 2020-11-25 NOTE — Progress Notes (Signed)
PT Cancellation Note  Patient Details Name: Jonathon Parker [facility] MRN: [id number] DOB: 1951/08/01   Cancelled Treatment:    Reason Eval/Treat Not Completed: Active bedrest order   Wyona Almas, PT, DPT Acute Rehabilitation Services Pager [phone] Office [phone]    Jonathon Parker 11/25/2020, 7:59 AM

## 2020-11-25 NOTE — Evaluation (Signed)
Physical Therapy Evaluation Patient Details Name: Jonathon Parker [facility] MRN: [id number] DOB: 09/28/1950 Today's Date: 11/25/2020   History of Present Illness  Pt is a 70 y.o. M who presents with left sided numbness and weakness. tPA given. 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 Significant PMH: asthma, CLL (stage 0), HTN, L heel reconstruction.  Clinical Impression  PTA, pt lives with his wife and is independent. Pt reports residual symptoms include "left arm heaviness," and impaired sensation. Left sided sensation intact to light touch upon assessment. Pt ambulating x 250 feet with no assistive device without physical difficulty. Cues for "softer," foot strike on the left side which pt was able to immediately correct. Don't anticipate need for PT follow up. Will continue to follow acutely.     Follow Up Recommendations No PT follow up    Equipment Recommendations  None recommended by PT    Recommendations for Other Services       Precautions / Restrictions Precautions Precautions: None Restrictions Weight Bearing Restrictions: No      Mobility  Bed Mobility Overal bed mobility: Needs Assistance Bed Mobility: Supine to Sit     Supine to sit: Supervision     General bed mobility comments: Increased time/effort    Transfers Overall transfer level: Needs assistance Equipment used: None Transfers: Sit to/from Stand Sit to Stand: Supervision            Ambulation/Gait Ambulation/Gait assistance: Supervision Gait Distance (Feet): 250 Feet Assistive device: None Gait Pattern/deviations: Step-through pattern;Decreased stride length Gait velocity: decreased   General Gait Details: Cues for "softer," left step; pt able to immediately correct. Demonstrates good heel strike and no knee hyperextension noted  Stairs            Wheelchair Mobility     Modified Rankin (Stroke Patients Only) Modified Rankin (Stroke Patients Only) Pre-Morbid Rankin Score: No symptoms Modified Rankin: Moderately severe disability     Balance Overall balance assessment: Mild deficits observed, not formally tested                                           Pertinent Vitals/Pain Pain Assessment: No/denies pain    Home Living Family/patient expects to be discharged to:: Private residence Living Arrangements: Spouse/significant other Available Help at Discharge: Family;Available 24 hours/day Type of Home: House Home Access: Stairs to enter Entrance Stairs-Rails: Right Entrance Stairs-Number of Steps: 6 Home Layout: Able to live on main level with bedroom/bathroom Home Equipment: None      Prior Function Level of Independence: Independent         Comments: Retired     Journalist, newspaper   Dominant Hand: Right    Extremity/Trunk Assessment   Upper Extremity Assessment Upper Extremity Assessment: Defer to OT evaluation    Lower Extremity Assessment Lower Extremity Assessment: RLE deficits/detail;LLE deficits/detail RLE Deficits / Details: Strength 5/5 RLE Sensation: WNL LLE Deficits / Details: Strength 5/5 LLE Sensation: WNL       Communication   Communication: No difficulties  Cognition Arousal/Alertness: Awake/alert Behavior During Therapy: WFL for tasks assessed/performed Overall Cognitive Status: Within Functional Limits for tasks assessed                                 General  Comments: Pt drove to his doctor's office when symptoms initiated, stating, "there was no one else to take me and they would've taken me to [facility] first if I called 911." But pt admits that this was not the best option and PT reinforced with rationale. Able to perform problem solving tasks with PT      General Comments      Exercises     Assessment/Plan    PT Assessment Patient needs continued PT  services  PT Problem List Decreased balance;Decreased mobility;Impaired sensation       PT Treatment Interventions Gait training;Stair training;Functional mobility training;Therapeutic exercise;Therapeutic activities;Balance training;Patient/family education    PT Goals (Current goals can be found in the Care Plan section)  Acute Rehab PT Goals Patient Stated Goal: return home PT Goal Formulation: With patient Time For Goal Achievement: 12/09/20 Potential to Achieve Goals: Good    Frequency Min 4X/week   Barriers to discharge        Co-evaluation               AM-PAC PT "6 Clicks" Mobility  Outcome Measure Help needed turning from your back to your side while in a flat bed without using bedrails?: None Help needed moving from lying on your back to sitting on the side of a flat bed without using bedrails?: A Little Help needed moving to and from a bed to a chair (including a wheelchair)?: A Little Help needed standing up from a chair using your arms (e.g., wheelchair or bedside chair)?: A Little Help needed to walk in hospital room?: A Little Help needed climbing 3-5 steps with a railing? : A Little 6 Click Score: 19    End of Session Equipment Utilized During Treatment: Gait belt Activity Tolerance: Patient tolerated treatment well Patient left: in chair;with call bell/phone within reach;with family/visitor present Nurse Communication: Mobility status PT Visit Diagnosis: Other abnormalities of gait and mobility (R26.89);Other symptoms and signs involving the nervous system (R29.898)    Time: 2119-4174 PT Time Calculation (min) (ACUTE ONLY): 29 min   Charges:   PT Evaluation $PT Eval Moderate Complexity: 1 Mod PT Treatments $Gait Training: 8-22 mins        Wyona Almas, PT, DPT Acute Rehabilitation Services Pager [phone] Office [phone]   Deno Etienne 11/25/2020, 4:29 PM

## 2020-11-25 NOTE — Progress Notes (Signed)
OT Cancellation Note  Patient Details Name: Jonathon Parker [facility] MRN: [id number] DOB: [birth year]   Cancelled Treatment:    Reason Eval/Treat Not Completed: Active bedrest order.  [place], OTR/L Acute Rehab Services Pager [phone] Office [phone]     Almon Register 11/25/2020, 6:39 AM

## 2020-11-25 NOTE — Progress Notes (Signed)
Jonathon Parker is an 70 y.o. male.   Assessment/Plan: 1.  Acute left-sided numbness suspected thalamic infarct confirmed by imaging status post IV tPA with no evidence of hemorrhage.  Risk factors age and hypertension.  Okay to start aspirin as no evidence of infarct. 2.  Hypertension at baseline 3.  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  The wife is at the bedside.  The patient reports that he did have some significant weakness/episode with the left side but this is better.  There is seem to be some fluctuation of the left-sided numbness at times  GENERAL: He is doing well at this time.  HEENT: There is reduced sensation to light touch temperature lower facial region on the left side  ABDOMEN: soft  EXTREMITIES: No edema   BACK: Normal  SKIN: Normal by inspection.    MENTAL STATUS: Alert and oriented. Speech, language and cognition are generally intact. Judgment and insight normal.   CRANIAL NERVES: Pupils are equal, round and reactive to light and accomodation; extra ocular movements are full, there is no significant nystagmus; visual fields are full; upper and lower facial muscles are normal in strength and symmetric, there is no flattening of the nasolabial folds; tongue is midline; uvula is midline; shoulder elevation is normal.  MOTOR: Normal tone, bulk and strength; no pronator drift.  There is no drift of the upper or lower extremities.  COORDINATION: Left finger to nose is normal, right finger to nose is normal, No rest tremor; no intention tremor; no postural tremor; no bradykinesia.  SENSATION: Reduced sensation to light touch anterior left side.        The post MRI imaging is reviewed in person which is an MRI.  The MRI shows acute stroke involving the lateral aspect of the right thalamus.  No hemorrhages noted.  There is moderate global atrophy and mild deep  white matter and periventricular leukoencephalopathy.  Objective: Vital signs in last 24 hours: Temp:  [98.1 F (36.7 C)-98.8 F (37.1 C)] 98.3 F (36.8 C) (04/23 1200) Pulse Rate:  [45-77] 61 (04/23 1400) Resp:  [10-21] 20 (04/23 1400) BP: (118-172)/(74-127) 161/91 (04/23 1400) SpO2:  [90 %-98 %] 95 % (04/23 1400) Weight:  [90.3 kg] 90.3 kg (04/22 1425)  Intake/Output from previous day: 04/22 0701 - 04/23 0700 In: 1030.8 [P.O.:250; I.V.:780.8] Out: 350 [Urine:350] Intake/Output this shift: Total I/O In: 303.2 [I.V.:303.2] Out: -  Nutritional status:  Diet Order            Diet Heart Room service appropriate? Yes; Fluid consistency: Thin  Diet effective now                  Lab Results: Results for orders placed or performed during the hospital encounter of 11/24/20 (from the past 48 hour(s))  CBG monitoring, ED     Status: Abnormal   Collection Time: 11/24/20 12:38 PM  Result Value Ref Range   Glucose-Capillary 190 (H) 70 - 99 mg/dL    Comment: Glucose reference range applies only to samples taken after fasting for at least 8 hours.  Protime-INR     Status: None   Collection Time: 11/24/20 12:40 PM  Result Value Ref Range   Prothrombin Time 13.7 11.4 - 15.2 seconds   INR 1.1 0.8 - 1.2    Comment: (NOTE) INR goal varies based on device and disease states. Performed at  [facility], [place] [address], [place], [place] [address]   APTT     Status: Abnormal   Collection Time: 11/24/20 12:40 PM  Result Value Ref Range   aPTT 22 (L) 24 - 36 seconds    Comment: Performed at [facility] [address] [place], [place] [address]  CBC     Status: Abnormal   Collection Time: 11/24/20 12:40 PM  Result Value Ref Range   WBC 33.3 (H) 4.0 - 10.5 K/uL   RBC 5.09 4.22 - 5.81 MIL/uL   Hemoglobin 16.9 13.0 - 17.0 g/dL   HCT 51.3 39.0 - 52.0 %   MCV 100.8 (H) 80.0 - 100.0 fL   MCH 33.2 26.0 - 34.0 pg   MCHC 32.9 30.0 - 36.0 g/dL   RDW 13.3 11.5 - 15.5 %    Platelets 193 150 - 400 K/uL   nRBC 0.0 0.0 - 0.2 %    Comment: Performed at [facility], [place] [address], [place], [place] [address]  Differential     Status: Abnormal   Collection Time: 11/24/20 12:40 PM  Result Value Ref Range   Neutrophils Relative % 41 %   Neutro Abs 13.7 (H) 1.7 - 7.7 K/uL   Lymphocytes Relative 56 %   Lymphs Abs 18.6 (H) 0.7 - 4.0 K/uL   Monocytes Relative 1 %   Monocytes Absolute 0.3 0.1 - 1.0 K/uL   Eosinophils Relative 0 %   Eosinophils Absolute 0.0 0.0 - 0.5 K/uL   Basophils Relative 2 %   Basophils Absolute 0.7 (H) 0.0 - 0.1 K/uL   nRBC 0 0 /100 WBC   Abs Immature Granulocytes 0.00 0.00 - 0.07 K/uL   Target Cells PRESENT     Comment: Performed at [facility] [address], [place],  [address]  Comprehensive metabolic panel     Status: Abnormal   Collection Time: 11/24/20 12:40 PM  Result Value Ref Range   Sodium 137 135 - 145 mmol/L   Potassium 5.9 (H) 3.5 - 5.1 mmol/L   Chloride 104 98 - 111 mmol/L   CO2 25 22 - 32 mmol/L   Glucose, Bld 183 (H) 70 - 99 mg/dL    Comment: Glucose reference range applies only to samples taken after fasting for at least 8 hours.   BUN 13 8 - 23 mg/dL   Creatinine, Ser 1.30 (H) 0.61 - 1.24 mg/dL   Calcium 8.9 8.9 - 10.3 mg/dL   Total Protein 6.5 6.5 - 8.1 g/dL   Albumin 4.1 3.5 - 5.0 g/dL   AST 33 15 - 41 U/L   ALT 19 0 - 44 U/L   Alkaline Phosphatase 92 38 - 126 U/L   Total Bilirubin 1.5 (H) 0.3 - 1.2 mg/dL   GFR, Estimated 59 (L) >60 mL/min    Comment: (NOTE) Calculated using the CKD-EPI Creatinine Equation (2021)    Anion gap 8 5 - 15    Comment: Performed at [facility] [address], [place], [place] [address]  I-STAT, [place] 8     Status: Abnormal   Collection Time: 11/24/20 12:44 PM  Result Value Ref Range   Sodium 137 135 - 145 mmol/L   Potassium 4.6 3.5 - 5.1 mmol/L   Chloride 104 98 - 111 mmol/L   BUN 16 8 - 23 mg/dL   Creatinine, Ser 1.20 0.61 - 1.24 mg/dL   Glucose,  Bld 176 (H) 70 - 99 mg/dL  Comment: Glucose reference range applies only to samples taken after fasting for at least 8 hours.   Calcium, Ion 0.85 (LL) 1.15 - 1.40 mmol/L   TCO2 25 22 - 32 mmol/L   Hemoglobin 17.3 (H) 13.0 - 17.0 g/dL   HCT 51.0 39.0 - 52.0 %   Comment NOTIFIED PHYSICIAN   Resp Panel by RT-PCR (Flu A&B, Covid) Nasopharyngeal Swab     Status: None   Collection Time: 11/24/20  1:35 PM   Specimen: Nasopharyngeal Swab; Nasopharyngeal(NP) swabs in vial transport medium  Result Value Ref Range   SARS Coronavirus 2 by RT PCR NEGATIVE NEGATIVE    Comment: (NOTE) SARS-CoV-2 target nucleic acids are NOT DETECTED.  The SARS-CoV-2 RNA is generally detectable in upper respiratory specimens during the acute phase of infection. The lowest concentration of SARS-CoV-2 viral copies this assay can detect is 138 copies/mL. A negative result does not preclude SARS-Cov-2 infection and should not be used as the sole basis for treatment or other patient management decisions. A negative result may occur with  improper specimen collection/handling, submission of specimen other than nasopharyngeal swab, presence of viral mutation(s) within the areas targeted by this assay, and inadequate number of viral copies(<138 copies/mL). A negative result must be combined with clinical observations, patient history, and epidemiological information. The expected result is Negative.  Fact Sheet for Patients:  EntrepreneurPulse.com.au  Fact Sheet for Healthcare Providers:  IncredibleEmployment.be  This test is no t yet approved or cleared by the [place] FDA and  has been authorized for detection and/or diagnosis of SARS-CoV-2 by FDA under an Emergency Use Authorization (EUA). This EUA will remain  in effect (meaning this test can be used) for the duration of the COVID-19 declaration under Section 564(b)(1) of the Act, 21 U.S.C.section 360bbb-3(b)(1), unless  the authorization is terminated  or revoked sooner.       Influenza A by PCR NEGATIVE NEGATIVE   Influenza B by PCR NEGATIVE NEGATIVE    Comment: (NOTE) The Xpert Xpress SARS-CoV-2/FLU/RSV plus assay is intended as an aid in the diagnosis of influenza from Nasopharyngeal swab specimens and should not be used as a sole basis for treatment. Nasal washings and aspirates are unacceptable for Xpert Xpress SARS-CoV-2/FLU/RSV testing.  Fact Sheet for Patients: EntrepreneurPulse.com.au  Fact Sheet for Healthcare Providers: IncredibleEmployment.be  This test is not yet approved or cleared by the [place] FDA and has been authorized for detection and/or diagnosis of SARS-CoV-2 by FDA under an Emergency Use Authorization (EUA). This EUA will remain in effect (meaning this test can be used) for the duration of the COVID-19 declaration under Section 564(b)(1) of the Act, 21 U.S.C. section 360bbb-3(b)(1), unless the authorization is terminated or revoked.  Performed at [facility], [place] [address] [place], [place] [address]   MRSA PCR Screening     Status: None   Collection Time: 11/24/20  2:26 PM   Specimen: Nasopharyngeal  Result Value Ref Range   MRSA by PCR NEGATIVE NEGATIVE    Comment:        The GeneXpert MRSA Assay (FDA approved for NASAL specimens only), is one component of a comprehensive MRSA colonization surveillance program. It is not intended to diagnose MRSA infection nor to guide or monitor treatment for MRSA infections. Performed at [facility], [place] [address] [place], [place] [address]   HIV Antibody (routine testing w rflx)     Status: None   Collection Time: 11/25/20 12:40 AM  Result Value Ref Range  HIV Screen 4th Generation wRfx Non Reactive Non Reactive    Comment: Performed at [facility], [place] [address], [place], [place] [address]  Hemoglobin A1c     Status: None   Collection Time: 11/25/20  12:40 AM  Result Value Ref Range   Hgb A1c MFr Bld 5.6 4.8 - 5.6 %    Comment: (NOTE) Pre diabetes:          5.7%-6.4%  Diabetes:              >6.4%  Glycemic control for   <7.0% adults with diabetes    Mean Plasma Glucose 114.02 mg/dL    Comment: Performed at [facility] [address] [place], [place] [address]  Lipid panel     Status: Abnormal   Collection Time: 11/25/20 12:40 AM  Result Value Ref Range   Cholesterol 141 0 - 200 mg/dL   Triglycerides 119 <150 mg/dL   HDL 30 (L) >40 mg/dL   Total CHOL/HDL Ratio 4.7 RATIO   VLDL 24 0 - 40 mg/dL   LDL Cholesterol 87 0 - 99 mg/dL    Comment:        Total Cholesterol/HDL:CHD Risk Coronary Heart Disease Risk Table                     Men   Women  1/2 Average Risk   3.4   3.3  Average Risk       5.0   4.4  2 X Average Risk   9.6   7.1  3 X Average Risk  23.4   11.0        Use the calculated Patient Ratio above and the CHD Risk Table to determine the patient's CHD Risk.        ATP III CLASSIFICATION (LDL):  <100     mg/dL   Optimal  100-129  mg/dL   Near or Above                    Optimal  130-159  mg/dL   Borderline  160-189  mg/dL   High  >190     mg/dL   Very High Performed at [facility] [address], [place],  [address]   TSH     Status: None   Collection Time: 11/25/20 12:40 AM  Result Value Ref Range   TSH 2.094 0.350 - 4.500 uIU/mL    Comment: Performed by a 3rd Generation assay with a functional sensitivity of <=0.01 uIU/mL. Performed at [facility], [place] [address], [place], [place] [address]     Lipid Panel Recent Labs    11/25/20 0040  CHOL 141  TRIG 119  HDL 30*  CHOLHDL 4.7  VLDL 24  LDLCALC 87    Studies/Results: BRAIN MRI IMPRESSION: 1. 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 2. Mild chronic small vessel ischemic disease and moderately advanced cerebral atrophy.  HEAD CT IMPRESSION: 1. No acute intracranial abnormality or significant interval  change. 2. Stable atrophy and white matter disease. 3. Aspects 10/10   HEAD NECK CTA IMPRESSION: 1. Mild atherosclerotic changes at the carotid bifurcations bilaterally without significant stenosis relative to the more distal vessels. 2. Normal variant Circle of Willis without significant proximal stenosis, aneurysm, or branch vessel occlusion. 3. Multilevel spondylosis of the cervical spine.   TTE 1. Left ventricular ejection fraction, by estimation, is 55 to 60%. The  left ventricle has normal function.  The left ventricle has no regional  wall motion abnormalities. Left ventricular diastolic parameters are  consistent with Grade I diastolic  dysfunction (impaired relaxation).  2. Right ventricular systolic function is normal. The right ventricular  size is normal. There is normal pulmonary artery systolic pressure.  3. Left atrial size was mildly dilated.  4. The mitral valve is normal in structure. Trivial mitral valve  regurgitation. No evidence of mitral stenosis.  5. The aortic valve is tricuspid. There is mild calcification of the  aortic valve. Aortic valve regurgitation is mild. No aortic stenosis is  present.  6. The inferior vena cava is normal in size with greater than 50%  respiratory variability, suggesting right atrial pressure of 3 mmHg.        Medications:  Scheduled Meds: .  stroke: mapping our early stages of recovery book   Does not apply Once  . Chlorhexidine Gluconate Cloth  6 each Topical Daily  . pantoprazole (PROTONIX) IV  40 mg Intravenous QHS  . traZODone  100 mg Oral QHS   Continuous Infusions: . sodium chloride 50 mL/hr at 11/25/20 1400   PRN Meds:.acetaminophen **OR** acetaminophen (TYLENOL) oral liquid 160 mg/5 mL **OR** acetaminophen, senna-docusate    This patient is critically ill due to stroke post tPA and at significant risk of neurological worsening, death form severe anemia, bleeding, recurrent stroke, intracranial  stenosis. This patient's care requires constant monitoring of vital signs, hemodynamics, respiratory and cardiac monitoring, review of multiple databases, neurological assessment, other specialists and medical decision making of high complexity. I spent 40 minutes of neurocritical care time in the care of this patient    LOS: 1 day   Dalia Jollie A. Merlene Laughter, M.D.  Diplomate, Tax adviser of Psychiatry and Neurology ( Neurology).

## 2020-11-25 NOTE — Progress Notes (Signed)
Pt called out to report "left arm feels funny." Upon assessment, strength and sensation remain the same from previous assessments. Patient states if feels as if "it's asleep, tingly" Contacted the stroke team who will take a look at the MRI.

## 2020-11-26 MED ORDER — ATORVASTATIN CALCIUM 10 MG PO TABS
20.0000 mg | ORAL_TABLET | Freq: Every day | ORAL | Status: DC
  Administered 2020-11-26: 20 mg via ORAL
  Filled 2020-11-26: qty 2

## 2020-11-26 MED ORDER — VALSARTAN 320 MG PO TABS: 320.0000 mg | ORAL_TABLET | Freq: Every day | ORAL | 0 refills | Status: DC

## 2020-11-26 MED ORDER — ATORVASTATIN CALCIUM 10 MG PO TABS: 20.0000 mg | ORAL_TABLET | Freq: Every day | ORAL | 1 refills | Status: DC

## 2020-11-26 MED ORDER — POLYETHYLENE GLYCOL 3350 17 GM/SCOOP PO POWD: ORAL | 0 refills | Status: DC

## 2020-11-26 MED ORDER — DOCUSATE SODIUM 100 MG PO CAPS: 100.0000 mg | ORAL_CAPSULE | Freq: Two times a day (BID) | ORAL | 0 refills | Status: DC

## 2020-11-26 MED ORDER — VITAMIN D 25 MCG (1000 UNIT) PO TABS: 2000.0000 [IU] | ORAL_TABLET | Freq: Every day | ORAL | Status: AC

## 2020-11-26 MED ORDER — TRAZODONE HCL 100 MG PO TABS: 100.0000 mg | ORAL_TABLET | Freq: Every day | ORAL | 0 refills | Status: AC

## 2020-11-26 MED ORDER — ENOXAPARIN SODIUM 30 MG/0.3ML ~~LOC~~ SOLN
30.0000 mg | SUBCUTANEOUS | Status: DC
  Administered 2020-11-26: 30 mg via SUBCUTANEOUS
  Filled 2020-11-26: qty 0.3

## 2020-11-26 MED ORDER — ESOMEPRAZOLE MAGNESIUM 40 MG PO CPDR: 40.0000 mg | DELAYED_RELEASE_CAPSULE | Freq: Every day | ORAL | 0 refills | Status: DC

## 2020-11-26 MED ORDER — PANTOPRAZOLE SODIUM 40 MG PO TBEC: 40.0000 mg | DELAYED_RELEASE_TABLET | Freq: Every day | ORAL | Status: DC

## 2020-11-26 MED ORDER — BUDESONIDE-FORMOTEROL FUMARATE 80-4.5 MCG/ACT IN AERO: 2.0000 | INHALATION_SPRAY | Freq: Two times a day (BID) | RESPIRATORY_TRACT | 0 refills | Status: DC

## 2020-11-26 MED ORDER — ALBUTEROL SULFATE HFA 108 (90 BASE) MCG/ACT IN AERS: 2.0000 | INHALATION_SPRAY | Freq: Four times a day (QID) | RESPIRATORY_TRACT | 0 refills | Status: DC | PRN

## 2020-11-26 MED ORDER — ASPIRIN 81 MG PO TBEC: 81.0000 mg | DELAYED_RELEASE_TABLET | Freq: Every day | ORAL | 0 refills | Status: DC

## 2020-11-26 MED ORDER — SERTRALINE HCL 50 MG PO TABS: 25.0000 mg | ORAL_TABLET | Freq: Every day | ORAL | 0 refills | Status: DC

## 2020-11-26 MED ORDER — AMLODIPINE BESYLATE 10 MG PO TABS: 10.0000 mg | ORAL_TABLET | Freq: Every day | ORAL | 1 refills | Status: DC

## 2020-11-26 MED ORDER — BUPROPION HCL ER (XL) 300 MG PO TB24: 300.0000 mg | ORAL_TABLET | Freq: Every day | ORAL | 0 refills | Status: AC

## 2020-11-26 MED ORDER — DONEPEZIL HCL 5 MG PO TABS: 5.0000 mg | ORAL_TABLET | Freq: Every day | ORAL | 0 refills | Status: DC

## 2020-11-26 NOTE — Progress Notes (Signed)
Pt and wife were given discharge instructions and were provided education (including meds). Pt and wife understood instructions and had no further questions.

## 2020-11-26 NOTE — Progress Notes (Signed)
STROKE TEAM PROGRESS NOTE   SUBJECTIVE (INTERVAL HISTORY) Patient is sitting in the chair. No family present during visit.  No neurological events overnight. He states that his symptoms have gotten better but still having some left side weakness and numbness.  Has been up ambulating today    OBJECTIVE Vitals:   11/26/20 0040 11/26/20 0500 11/26/20 0740 11/26/20 1141  BP: (!) 141/75 (!) 158/79 (!) 164/83 (!) 141/93  Pulse: (!) 55 (!) 55 (!) 53 63  Resp:  18 16 18   Temp: 97.9 F (36.6 C) 97.7 F (36.5 C) 98.2 F (36.8 C) (!) 97.5 F (36.4 C)  TempSrc: Oral Oral Oral Oral  SpO2: 94% 96% 95% 97%  Weight:      Height:        CBC:  Recent Labs  Lab 11/24/20 1240 11/24/20 1244  WBC 33.3*  --   NEUTROABS 13.7*  --   HGB 16.9 17.3*  HCT 51.3 51.0  MCV 100.8*  --   PLT 193  --     Basic Metabolic Panel:  Recent Labs  Lab 11/24/20 1240 11/24/20 1244  NA 137 137  K 5.9* 4.6  CL 104 104  CO2 25  --   GLUCOSE 183* 176*  BUN 13 16  CREATININE 1.30* 1.20  CALCIUM 8.9  --     Lipid Panel:  Recent Labs  Lab 11/25/20 0040  CHOL 141  TRIG 119  HDL 30*  CHOLHDL 4.7  VLDL 24  LDLCALC 87   HgbA1c:  Lab Results  Component Value Date   HGBA1C 5.6 11/25/2020   Urine Drug Screen: No results found for: LABOPIA, COCAINSCRNUR, LABBENZ, AMPHETMU, THCU, LABBARB  Alcohol Level No results found for: [facility]  IMAGING  Results for orders placed or performed during the hospital encounter of 11/24/20  MR BRAIN WO CONTRAST   Narrative   CLINICAL DATA:  Left-sided weakness and numbness.  EXAM: MRI HEAD WITHOUT CONTRAST  TECHNIQUE: Multiplanar, multiecho pulse sequences of the brain and surrounding structures were obtained without intravenous contrast.  COMPARISON:  Head CT and CTA 11/24/2020.  Head MRI 04/03/2020.  FINDINGS: Brain: A 1 cm focus of mild diffusion weighted signal abnormality in the right thalamus is consistent with an acute to early subacute infarct.  Scattered small foci of T2 hyperintensity in the cerebral white matter bilaterally are unchanged from the prior MRI and are nonspecific but compatible with mild chronic small vessel ischemic disease. Moderately advanced cerebral atrophy is unchanged from the prior MRI. No intracranial hemorrhage, mass, midline shift, or extra-axial fluid collection is identified.  Vascular: Major intracranial arterial flow voids are preserved. Absent flow void in the left sigmoid sinus and left jugular bulb likely reflects slow flow rather than occlusion as these appeared patent on yesterday's CTA.  Skull and upper cervical spine: Unremarkable bone marrow signal.  Sinuses/Orbits: Unremarkable orbits. Minimal mucosal thickening in the paranasal sinuses. Trace left mastoid fluid.  Other: Prominent lymph nodes in the upper neck bilaterally, more fully evaluated on yesterday's CTA.  IMPRESSION: 1. 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 2. Mild chronic small vessel ischemic disease and moderately advanced cerebral atrophy.   Electronically Signed   By: Logan Bores M.D.   On: 11/25/2020 13:06        PHYSICAL EXAM  Physical Exam  Constitutional: Appears well-developed and well-nourished.  Psych: Affect appropriate to situation Eyes: No scleral injection HENT: No OP obstrucion MSK: no joint deformities.  Cardiovascular: Normal rate and regular rhythm.  Respiratory:  Effort normal, non-labored breathing GI: Soft.  No distension. There is no tenderness.  Skin: WDI  Neuro: Mental Status: Patient is awake, alert, oriented to person, place, month, year, and situation. Patient is able to give a clear and coherent history. No signs of aphasia or neglect Cranial Nerves: II: Visual Fields are full. Pupils are equal, round, and reactive to light.   III,IV, VI: EOMI without ptosis or diploplia.  V: Facial sensation is symmetric to temperature VII: Facial movement is symmetric.  VIII:  hearing is intact to voice X: Uvula elevates symmetrically XI: Shoulder shrug is symmetric. XII: tongue is midline without atrophy or fasciculations.  Motor: Tone is normal. Bulk is normal. 5/5 strength was present in all four extremities.  Sensory: Decreased sensation on left side  Cerebellar: FNF and HKS are intact bilaterally   ASSESSMENT/PLAN [name] is a 70 y.o. male with history of HTN, GERD, Depression, mild cognitive impairment. He presents to the ED left side weakness,and numbness.  He drove himself to his PCP and PCP called EMS. He reports uncontrolled BP over the last couple of weeks with medication changes.  CTH with no acute abnormality and no hemorrhage.  He received IV TPA.  Acute Stroke:  right  infarct embolic secondary to small vessel disease source CT head  No acute intracranial abnormality or significant interval change.  2. Stable atrophy and white matter disease.  3. Aspects 10/10  CTA H/N  1. Mild atherosclerotic changes at the carotid bifurcations bilaterally without significant stenosis relative to the more distal vessels. 2. Normal variant Circle of Willis without significant proximal stenosis, aneurysm, or branch vessel occlusion. 3. Multilevel spondylosis of the cervical spine   MRI head  1. 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 2. Mild chronic small vessel ischemic disease and moderately advanced cerebral atrophy.   2D Echo   1. Left ventricular ejection fraction, by estimation, is 55 to 60%. The  left ventricle has normal function. The left ventricle has no regional  wall motion abnormalities. Left ventricular diastolic parameters are  consistent with Grade I diastolic  dysfunction (impaired relaxation).  2. Right ventricular systolic function is normal. The right ventricular  size is normal. There is normal pulmonary artery systolic pressure.  3. Left atrial size was mildly dilated.  4. The mitral valve is  normal in structure. Trivial mitral valve  regurgitation. No evidence of mitral stenosis.  5. The aortic valve is tricuspid. There is mild calcification of the  aortic valve. Aortic valve regurgitation is mild. No aortic stenosis is  present.  6. The inferior vena cava is normal in size with greater than 50%  respiratory variability, suggesting right atrial pressure of 3 mmHg.   LDL 87  HgbA1c 5.6  Lovenox/SCD's for VTE prophylaxis Diet Order            Diet Heart Room service appropriate? Yes; Fluid consistency: Thin  Diet effective now                  No antithrombotic prior to admission, now on aspirin 81 mg daily.   Patient counseled to be compliant with his antithrombotic medications  Ongoing aggressive stroke risk factor management  Therapy recommendations:  Per PT/OT  Disposition:  Pending   Hypertension  UnStable .  Long-term BP goal normotensive, 140/70 . Continue norvasc 10mg  daily, avapro 300mg  daily   Hyperlipidemia  Home meds:none   LDL-c 87, goal < 70  Add atorvastatin 40mg  daily   Continue  statin at discharge  Diabetes type II  HgbA1c 5.6, goal < 7.0  Controlled  Other Stroke Risk Factors  Advanced age  ETOH use, advised to drink no more than 2 drink(s) a day  Coronary artery disease  Other Active Problems Anxiety/Depression  Continue home wellbutrin   Insomnia  Continue home trazodone and xanax   GERD  Continue home protonix   Hospital day # 2 Beulah Gandy, DNP, ACNPC-AG  To contact Stroke Continuity provider, please refer to [email or web address] After hours, contact General Neurology

## 2020-11-26 NOTE — Discharge Instructions (Signed)
Hospital Discharge After a Stroke  Being discharged from the hospital after a stroke can feel overwhelming. Many things may be different. It is normal to feel scared or anxious. Some stroke survivors may be able to return to their homes. Others may need more specialized care on a temporary or permanent basis. Your stroke care team will work with you to develop a discharge plan that is best for you. Ask questions if you do not understand something. Invite a friend or family member to participate in discharge planning. It is important to understand and follow your discharge plan to help prevent another stroke or other problems. General recommendations  Ask a friend or family member to get needed things in place before you go home if possible. A therapist can come to your home to make recommendations for safety equipment. Ask your health care provider if you would benefit from this service or from home care.  Take steps to prevent falls, such as: ? Installing grab bars in the shower or using a shower chair. ? Install grab bars by the toilet. ? Removing tripping hazards, such as area rugs or cords.   Supplies needed Ask your health care provider for a list of medical equipment and supplies you will need at home. These may include:  Walkers.  Canes.  Wheelchairs.  Hand-strengthening devices.  Special eating utensils. Medical equipment can be rented or purchased, depending on your insurance coverage. Check with your insurance company about what is covered. Follow these instructions at home: Medicines  Take all medicines exactly as told by your health care provider to prevent serious harm, such as another stroke. Make sure you understand: ? What medicine to take. ? Why you are taking the medicine. ? How and when to take it. ? If it can be taken with your other medicines and herbal supplements. ? Possible side effects, and when to call your health care provider if you have side  effects. ? How you will get and pay for your medicines. Medical assistance programs may be able to help you pay for prescription medicines if you cannot afford them.  If you are taking an anticoagulant: ? Be sure to take it exactly as told by your health care provider. This medicine can increase the risk of bleeding because it works to prevent blood clots. You may need to take certain precautions to prevent bleeding. ? Do not take medicines that contain aspirin or NSAIDs, such as ibuprofen, unless your health care provider approves. These medicines increase your risk for dangerous bleeding. Preventing another stroke Having a stroke puts you at risk for another stroke in the future. Ask your health care provider what actions you can take to lower the risk. These may include:  Increasing how much you exercise.  Making a healthy eating plan.  Quitting smoking.  Managing other health conditions, such as high blood pressure, high cholesterol, or diabetes.  Limiting alcohol use. General instructions  Before you leave the hospital, you will be given information about stroke warning signs. Share these with your friends and family members.  You will need to follow up regularly with a health care provider. You may need rehabilitation, such as physical therapy, occupational therapy, and speech-language therapy. Keeping these appointments is very important to your recovery.  Be sure to bring your medicine list and discharge papers to your appointments. Use a calendar or appointment reminder if you need help to keep track of your schedule. Contact a health care provider if:  You  are taking an anticoagulant, and you have one or more of these problems: ? Bleeding or bruising. ? A fall or other injury to your head. ? Blood in your urine or stool (feces). Get help right away if you:  Have any symptoms of a stroke. "BE FAST" is an easy way to remember the main warning signs of a stroke: ? B -  Balance. Signs are dizziness, sudden trouble walking, or loss of balance. ? E - Eyes. Signs are trouble seeing or a sudden change in vision. ? F - Face. Signs are sudden weakness or numbness of the face, or the face or eyelid drooping on one side. ? A - Arms. Signs are weakness or numbness in an arm. This happens suddenly and usually on one side of the body. ? S - Speech. Signs are sudden trouble speaking, slurred speech, or trouble understanding what people say. ? T - Time. Time to call emergency services. Write down what time symptoms started. Your emergency responders will need to know this information.  Have other signs of a stroke, such as: ? A sudden, severe headache with no known cause. ? Nausea or vomiting. ? Seizure. These symptoms may represent a serious problem that is an emergency. Do not wait to see if the symptoms will go away. Get medical help right away. Call your local emergency services (911 in the [place] Do not drive yourself to the hospital.   Summary  Being discharged from the hospital after a stroke can feel overwhelming. It is normal to feel scared or anxious.  Make sure you take medicines exactly as told by your health care provider.  Know the warning signs of a stroke. Before you leave the hospital, you will be given information about stroke warning signs. Share these with your friends and family members.  Get help right way if you have any symptoms of a stroke. "BE FAST" is an easy way to remember the main warning signs of a stroke. This information is not intended to replace advice given to you by your health care provider. Make sure you discuss any questions you have with your health care provider. Document Revised: 03/08/2020 Document Reviewed: 03/08/2020 Elsevier Patient Education  [place]. Alteplase Treatment for Ischemic Stroke  Alteplase is a medicine that can break up blood clots. An ischemic stroke is caused by clots that block blood flow to the  brain. Alteplase can help to treat a stroke if it is given as soon as possible after stroke symptoms start. Before giving this medicine, your doctor will decide if it may work for you based on the following:  Your age.  Your condition.  Other factors. Tell your doctor about:  Any allergies you have.  All medicines you are taking.  Any medical conditions you have.  Any blood problems you have.  Any bleeding you have had in the last 21 days. This may include blood in your pee (urine), poop (stool), or vomit. It may also include bleeding from the vagina.  Any surgeries you have had.  Whether you are pregnant or may be pregnant.  The time your symptoms started. What are the risks? This treatment can save your life. But it also has some serious risks. You and your doctor will decide if this treatment is right for you. Risks include:  Bleeding in the brain.  Bleeding in other parts of the body.  Being allergic to the medicine. What happens before the treatment?  Your doctor will do an exam.  Your body temperature, blood pressure, heart rate, and breathing will be checked.  Medicines may be given to help your blood pressure, if needed.  You may have tests, such as: ? Blood tests. ? CT scan or MRI. What happens during the treatment?  An IV tube will be put into one of your veins.  Alteplase will be given to you through the IV tube.  In some cases, this medicine may go to the affected area through a thin tube (catheter). This tube is usually put in at the top of your leg.  These things will be closely watched as you get the medicine: ? Your blood pressure. ? Your heart rate. ? Your breathing. ? Your brain function.  Your doctor will check often to see how well the medicine is working.  If the medicine causes you to bleed, it will be stopped. The procedure may vary among doctors and hospitals. What can I expect after the treatment?  You will be watched closely in  the ICU or the stroke unit.  You will be checked by specialists who will help with your rehab.  If you had a catheter, you may have: ? Bruising. ? Soreness. ? Swelling.  It may take days, weeks, or months to see how well the treatment worked. Follow these instructions in the hospital: Activity  Do not get out of bed without help. This is to keep you safe.  Limit activity after your treatment.  Do stroke rehab programs as told by your doctor. General instructions  Take over-the-counter and prescription medicines only as told by your doctor.  Do not take medicines that contain aspirin or NSAIDs, such as ibuprofen, unless your doctor says it is okay.  Tell a nurse or doctor right away if you have bleeding, bruising, or injuries.  Do not use any products that contain nicotine or tobacco. If you need help quitting, ask your doctor.  Use a soft toothbrush. Brush your teeth gently. Get help right away if:  You have blood in your vomit, pee, or poop.  You have a serious fall or accident, or you hit your head.  You have symptoms of an allergy to the medicine, such as a rash or trouble breathing.  You have any signs of a stroke. "BE FAST" is an easy way to remember the main warning signs: ? B - Balance. Dizziness, sudden trouble walking, or loss of balance. ? E - Eyes. Trouble seeing or a change in how you see. ? F - Face. Sudden weakness or loss of feeling in the face. The face or eyelid may droop on one side. ? A - Arms. Weakness or loss of feeling in an arm. This happens all of a sudden and most often on one side of the body. ? S - Speech. Sudden trouble speaking, slurred speech, or trouble understanding what people say. ? T - Time. Time to call emergency services. Write down what time symptoms started.  You have other signs of a stroke, such as: ? A sudden, very bad headache with no known cause. ? Feeling like you may vomit (nausea). ? Vomiting. ? A seizure. These symptoms  may be an emergency. Get help right away. Call your local emergency services (911 in the [place]  Do not wait to see if the symptoms will go away.  Do not drive yourself to the hospital. Summary  Blood clots can block blood flow to the brain and cause a stroke. Alteplase is a medicine that can break up  blood clots.  This medicine may help if you get it as soon as possible after your stroke symptoms start.  You will be watched closely in the ICU or the stroke unit.  Get help right away if you are showing signs of bleeding or if you are having symptoms of a stroke. This information is not intended to replace advice given to you by your health care provider. Make sure you discuss any questions you have with your health care provider. Document Revised: 04/11/2020 Document Reviewed: 04/11/2020 Elsevier Patient Education  2021 [employer].

## 2020-11-26 NOTE — Evaluation (Addendum)
Occupational Therapy Evaluation Patient Details Name: Jonathon Parker [facility] MRN: [id number] DOB: Oct 04, 1950 Today's Date: 11/26/2020    History of Present Illness Pt is a 70 y.o. M who presents with left sided numbness and weakness. tPA given. 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 Significant PMH: asthma, CLL (stage 0), HTN, L heel reconstruction.   Clinical Impression   Pt PTA: Pt living with spouse and reports independence with ADL and mobility. Pt currently, with L side mostly LUE sensation deficits and fine motor coordination slight deficits due to numbness, tingling. Pt ambulatory in room without AD and supervisionA. Pt given strategies to LUE as much as possible, for hot/cold tasks use RUE. Pt may not require OP OT as symptoms may resolve with time. Pt supervisionA to modified independence with ADL. Pt and spouse education on "BEFAST" s/s of CVA. Pt with good understanding. Pt would benefit from continued OT. OT following acutely.    Follow Up Recommendations  Outpatient OT (up to patient for LUE sensation and fine motor.)    Equipment Recommendations  None recommended by OT    Recommendations for Other Services       Precautions / Restrictions Precautions Precautions: None Restrictions Weight Bearing Restrictions: No      Mobility Bed Mobility               General bed mobility comments: In recliner pre and post session    Transfers Overall transfer level: Needs assistance Equipment used: None Transfers: Sit to/from Stand Sit to Stand: Supervision              Balance Overall balance assessment: Mild deficits observed, not formally tested                                         ADL either performed or assessed with clinical judgement   ADL Overall ADL's : Modified independent                                        General ADL Comments: SupervisionA advised for bathing or ADL in standing.     Vision Baseline Vision/History: Wears glasses Wears Glasses: At all times Patient Visual Report: No change from baseline Vision Assessment?: Yes;No apparent visual deficits Eye Alignment: Within Functional Limits Ocular Range of Motion: Within Functional Limits Alignment/Gaze Preference: Within Defined Limits Tracking/Visual Pursuits: Able to track stimulus in all quads without difficulty Additional Comments: c/o blurriness, but per pt it has mostly resolved. Single vision screening for both eyes with accuracy.     Perception     Praxis      Pertinent Vitals/Pain Pain Assessment: No/denies pain     Hand Dominance Right   Extremity/Trunk Assessment Upper Extremity Assessment Upper Extremity Assessment: RUE deficits/detail;LUE deficits/detail RUE Deficits / Details: 5/5 strength LUE Deficits / Details: 5/5 strength LUE Sensation: decreased light touch LUE Coordination: decreased fine motor   Lower Extremity Assessment Lower Extremity Assessment: Overall WFL for tasks assessed   Cervical / Trunk Assessment Cervical / Trunk Assessment: Normal   Communication Communication Communication: No difficulties   Cognition Arousal/Alertness: Awake/alert Behavior During Therapy: WFL for tasks assessed/performed Overall Cognitive Status: Within Functional Limits for tasks assessed  General Comments: Pt drove to MD office instead of calling 911 when symptoms started.   General Comments  Spouse in room    Exercises Exercises: Other exercises Other Exercises Other Exercises: encouraged ROM and use of LUE for GMC/FMC tasks   Shoulder Instructions      Home Living Family/patient expects to be discharged to:: Private residence Living Arrangements: Spouse/significant other Available Help at Discharge: Family;Available 24 hours/day Type of Home:  House Home Access: Stairs to enter [employer] of Steps: 6 Entrance Stairs-Rails: Right Home Layout: Able to live on main level with bedroom/bathroom     Bathroom Shower/Tub: Occupational psychologist: Standard     Home Equipment: None          Prior Functioning/Environment Level of Independence: Independent        Comments: Retired        Secretary/administrator Problem List: Decreased activity tolerance      OT Treatment/Interventions: Self-care/ADL training;Therapeutic exercise;Energy conservation;Therapeutic activities;Patient/family education;Visual/perceptual remediation/compensation    OT Goals(Current goals can be found in the care plan section) Acute Rehab OT Goals Patient Stated Goal: return home OT Goal Formulation: All assessment and education complete, DC therapy Potential to Achieve Goals: Good ADL Goals Pt/caregiver will Perform Home Exercise Program: Left upper extremity;Independently Additional ADL Goal #1: Pt will participate in compensatory strategies for sensation changes in LUE for fine motor/gross motor coordination.  OT Frequency: Min 2X/week   Barriers to D/C:            Co-evaluation              AM-PAC OT "6 Clicks" Daily Activity     Outcome Measure Help from another person eating meals?: None Help from another person taking care of personal grooming?: None Help from another person toileting, which includes using toliet, bedpan, or urinal?: None Help from another person bathing (including washing, rinsing, drying)?: A Little Help from another person to put on and taking off regular upper body clothing?: None Help from another person to put on and taking off regular lower body clothing?: None 6 Click Score: 23   End of Session Equipment Utilized During Treatment: Gait belt Nurse Communication: Mobility status  Activity Tolerance: Patient tolerated treatment well Patient left: in chair;with call bell/phone within reach;with  chair alarm set;with family/visitor present  OT Visit Diagnosis: Unsteadiness on feet (R26.81)                Time: 1751-0258 OT Time Calculation (min): 27 min Charges:  OT General Charges $OT Visit: 1 Visit OT Evaluation $OT Eval Moderate Complexity: 1 Mod OT Treatments $Neuromuscular Re-education: 8-22 mins  Jefferey Pica, OTR/L Acute Rehabilitation Services Pager: [phone] Office: [phone]   Janie Strothman C 11/26/2020, 1:36 PM

## 2020-11-26 NOTE — Discharge Summary (Signed)
Stroke Discharge Summary  Patient ID: Jonathon Parker   MRN: [id number]      DOB: 08/21/1950  Date of Admission: 11/24/2020 Date of Discharge: 11/26/2020  Attending Physician:  Stroke, Md, MD, Stroke MD  Patient's PCP:  Nicoletta Dress, MD  DISCHARGE DIAGNOSIS:  Active Problems:   GERD (gastroesophageal reflux disease)   Depression   Essential hypertension   Acute ischemic VBA thalamic stroke, right ([place])   Allergies as of 11/26/2020      Reactions   Lisinopril Cough      Medication List    STOP taking these medications   ALPRAZolam 0.25 MG tablet Commonly known as: XANAX   ibuprofen 200 MG tablet Commonly known as: ADVIL     TAKE these medications   albuterol 108 (90 Base) MCG/ACT inhaler Commonly known as: VENTOLIN HFA Inhale 2 puffs into the lungs every 6 (six) hours as needed for wheezing or shortness of breath.   amLODipine 10 MG tablet Commonly known as: NORVASC Take 1 tablet (10 mg total) by mouth daily.   aspirin 81 MG EC tablet Take 1 tablet (81 mg total) by mouth daily. Swallow whole. Start taking on: November 27, 2020   atorvastatin 10 MG tablet Commonly known as: LIPITOR Take 2 tablets (20 mg total) by mouth daily at 6 PM.   budesonide-formoterol 80-4.5 MCG/ACT inhaler Commonly known as: Symbicort Inhale 2 puffs into the lungs in the morning and at bedtime.   buPROPion 300 MG 24 hr tablet Commonly known as: WELLBUTRIN XL Take 1 tablet (300 mg total) by mouth daily.   cholecalciferol 25 MCG (1000 UNIT) tablet Commonly known as: VITAMIN D3 Take 2 tablets (2,000 Units total) by mouth daily. Takes 2 tablets (1,000 units each) for a total of 2,000 units daily   docusate sodium 100 MG capsule Commonly known as: COLACE Take 1 capsule (100 mg total) by mouth 2 (two) times daily. What changed:   when to take this  reasons to take this   donepezil 5 MG tablet Commonly known as: ARICEPT Take 1 tablet (5 mg total) by mouth daily.    esomeprazole 40 MG capsule Commonly known as: NEXIUM Take 1 capsule (40 mg total) by mouth daily.   polyethylene glycol powder 17 GM/SCOOP powder Commonly known as: GLYCOLAX/MIRALAX Take 1 capful of powder mixed in drink by mouth 1 to 3 times daily Until daily soft stools  OTC What changed:   how much to take  how to take this  when to take this  reasons to take this  additional instructions   sertraline 50 MG tablet Commonly known as: ZOLOFT Take 0.5 tablets (25 mg total) by mouth daily.   traZODone 100 MG tablet Commonly known as: DESYREL Take 1 tablet (100 mg total) by mouth at bedtime.   valsartan 320 MG tablet Commonly known as: DIOVAN Take 1 tablet (320 mg total) by mouth daily.       LABORATORY STUDIES CBC    Component Value Date/Time   WBC 33.3 (H) 11/24/2020 1240   RBC 5.09 11/24/2020 1240   HGB 17.3 (H) 11/24/2020 1244   HCT 51.0 11/24/2020 1244   PLT 193 11/24/2020 1240   MCV 100.8 (H) 11/24/2020 1240   MCV 99 (A) 10/20/2020 0000   MCH 33.2 11/24/2020 1240   MCHC 32.9 11/24/2020 1240   RDW 13.3 11/24/2020 1240   LYMPHSABS 18.6 (H) 11/24/2020 1240   MONOABS 0.3 11/24/2020 1240   EOSABS 0.0 11/24/2020 1240  BASOSABS 0.7 (H) 11/24/2020 1240   CMP    Component Value Date/Time   NA 137 11/24/2020 1244   NA 140 06/22/2020 0000   K 4.6 11/24/2020 1244   CL 104 11/24/2020 1244   CO2 25 11/24/2020 1240   GLUCOSE 176 (H) 11/24/2020 1244   BUN 16 11/24/2020 1244   BUN 16 06/22/2020 0000   CREATININE 1.20 11/24/2020 1244   CALCIUM 8.9 11/24/2020 1240   PROT 6.5 11/24/2020 1240   ALBUMIN 4.1 11/24/2020 1240   AST 33 11/24/2020 1240   ALT 19 11/24/2020 1240   ALKPHOS 92 11/24/2020 1240   BILITOT 1.5 (H) 11/24/2020 1240   GFRNONAA 59 (L) 11/24/2020 1240   GFRAA >60 05/13/2019 0312   COAGS Lab Results  Component Value Date   INR 1.1 11/24/2020   INR 1.0 05/10/2019   INR 1.1 03/01/2019   Lipid Panel    Component Value Date/Time    CHOL 141 11/25/2020 0040   TRIG 119 11/25/2020 0040   HDL 30 (L) 11/25/2020 0040   CHOLHDL 4.7 11/25/2020 0040   VLDL 24 11/25/2020 0040   LDLCALC 87 11/25/2020 0040   HgbA1C  Lab Results  Component Value Date   HGBA1C 5.6 11/25/2020   Urinalysis    Component Value Date/Time   COLORURINE AMBER (A) 05/10/2019 1518   APPEARANCEUR CLEAR 05/10/2019 1518   LABSPEC 1.020 05/10/2019 1518   PHURINE 6.0 05/10/2019 1518   GLUCOSEU NEGATIVE 05/10/2019 1518   HGBUR NEGATIVE 05/10/2019 1518   BILIRUBINUR NEGATIVE 05/10/2019 1518   KETONESUR NEGATIVE 05/10/2019 1518   PROTEINUR NEGATIVE 05/10/2019 1518   NITRITE NEGATIVE 05/10/2019 1518   LEUKOCYTESUR NEGATIVE 05/10/2019 1518    SIGNIFICANT DIAGNOSTIC STUDIES MR BRAIN WO CONTRAST  Result Date: 11/25/2020 CLINICAL DATA:  Left-sided weakness and numbness. EXAM: MRI HEAD WITHOUT CONTRAST TECHNIQUE: Multiplanar, multiecho pulse sequences of the brain and surrounding structures were obtained without intravenous contrast. COMPARISON:  Head CT and CTA 11/24/2020.  Head MRI 04/03/2020. FINDINGS: Brain: A 1 cm focus of mild diffusion weighted signal abnormality in the right thalamus is consistent with an acute to early subacute infarct. Scattered small foci of T2 hyperintensity in the cerebral white matter bilaterally are unchanged from the prior MRI and are nonspecific but compatible with mild chronic small vessel ischemic disease. Moderately advanced cerebral atrophy is unchanged from the prior MRI. No intracranial hemorrhage, mass, midline shift, or extra-axial fluid collection is identified. Vascular: Major intracranial arterial flow voids are preserved. Absent flow void in the left sigmoid sinus and left jugular bulb likely reflects slow flow rather than occlusion as these appeared patent on yesterday's CTA. Skull and upper cervical spine: Unremarkable bone marrow signal. Sinuses/Orbits: Unremarkable orbits. Minimal mucosal thickening in the paranasal  sinuses. Trace left mastoid fluid. Other: Prominent lymph nodes in the upper neck bilaterally, more fully evaluated on yesterday's CTA. IMPRESSION: 1. 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 2. Mild chronic small vessel ischemic disease and moderately advanced cerebral atrophy. Electronically Signed   By: Sebastian Ache M.D.   On: 11/25/2020 13:06   ECHOCARDIOGRAM COMPLETE  Result Date: 11/24/2020    ECHOCARDIOGRAM REPORT   Patient Name:   Jonathon Parker Date of Exam: 11/24/2020 Medical Rec #:  [id number]              Height:       72.0 in Accession #:    [id number]             Weight:  199.1 lb Date of Birth:  Aug 31, 1950               BSA:          2.126 m Patient Age:    57 years               BP:           127/80 mmHg Patient Gender: M                      HR:           60 bpm. Exam Location:  Inpatient Procedure: 2D Echo Indications:    stroke  History:        Patient has no prior history of Echocardiogram examinations.  Sonographer:    Johny Chess Referring Phys: [id number] [place]  1. Left ventricular ejection fraction, by estimation, is 55 to 60%. The left ventricle has normal function. The left ventricle has no regional wall motion abnormalities. Left ventricular diastolic parameters are consistent with Grade I diastolic dysfunction (impaired relaxation).  2. Right ventricular systolic function is normal. The right ventricular size is normal. There is normal pulmonary artery systolic pressure.  3. Left atrial size was mildly dilated.  4. The mitral valve is normal in structure. Trivial mitral valve regurgitation. No evidence of mitral stenosis.  5. The aortic valve is tricuspid. There is mild calcification of the aortic valve. Aortic valve regurgitation is mild. No aortic stenosis is present.  6. The inferior vena cava is normal in size with greater than 50% respiratory variability, suggesting right atrial pressure of 3 mmHg. FINDINGS  Left Ventricle: Left  ventricular ejection fraction, by estimation, is 55 to 60%. The left ventricle has normal function. The left ventricle has no regional wall motion abnormalities. The left ventricular internal cavity size was normal in size. There is  no left ventricular hypertrophy. Left ventricular diastolic parameters are consistent with Grade I diastolic dysfunction (impaired relaxation). Right Ventricle: The right ventricular size is normal. No increase in right ventricular wall thickness. Right ventricular systolic function is normal. There is normal pulmonary artery systolic pressure. The tricuspid regurgitant velocity is 2.25 m/s, and  with an assumed right atrial pressure of 3 mmHg, the estimated right ventricular systolic pressure is 123XX123 mmHg. Left Atrium: Left atrial size was mildly dilated. Right Atrium: Right atrial size was normal in size. Pericardium: There is no evidence of pericardial effusion. Mitral Valve: The mitral valve is normal in structure. Trivial mitral valve regurgitation. No evidence of mitral valve stenosis. Tricuspid Valve: The tricuspid valve is normal in structure. Tricuspid valve regurgitation is trivial. No evidence of tricuspid stenosis. Aortic Valve: The aortic valve is tricuspid. There is mild calcification of the aortic valve. Aortic valve regurgitation is mild. Aortic regurgitation PHT measures 554 msec. No aortic stenosis is present. Pulmonic Valve: The pulmonic valve was normal in structure. Pulmonic valve regurgitation is not visualized. No evidence of pulmonic stenosis. Aorta: The aortic root is normal in size and structure. Venous: The inferior vena cava is normal in size with greater than 50% respiratory variability, suggesting right atrial pressure of 3 mmHg. IAS/Shunts: No atrial level shunt detected by color flow Doppler.  LEFT VENTRICLE PLAX 2D LVIDd:         4.80 cm  Diastology LVIDs:         2.80 cm  LV e' medial:    6.31 cm/s LV PW:  0.90 cm  LV E/e' medial:  12.4 LV IVS:         0.90 cm  LV e' lateral:   11.40 cm/s LVOT diam:     1.90 cm  LV E/e' lateral: 6.9 LV SV:         64 LV SV Index:   30 LVOT Area:     2.84 cm  RIGHT VENTRICLE             IVC RV S prime:     13.20 cm/s  IVC diam: 1.30 cm TAPSE (M-mode): 2.2 cm LEFT ATRIUM             Index       RIGHT ATRIUM           Index LA diam:        4.00 cm 1.88 cm/m  RA Area:     14.10 cm LA Vol (A2C):   34.2 ml 16.08 ml/m RA Volume:   34.00 ml  15.99 ml/m LA Vol (A4C):   50.2 ml 23.61 ml/m LA Biplane Vol: 42.8 ml 20.13 ml/m  AORTIC VALVE LVOT Vmax:   98.50 cm/s LVOT Vmean:  64.900 cm/s LVOT VTI:    0.225 m AI PHT:      554 msec  AORTA Ao Root diam: 2.90 cm Ao Asc diam:  3.20 cm MITRAL VALVE                TRICUSPID VALVE MV Area (PHT): 2.83 cm     TR Peak grad:   20.2 mmHg MV Decel Time: 268 msec     TR Vmax:        225.00 cm/s MV E velocity: 78.30 cm/s MV A velocity: 108.00 cm/s  SHUNTS MV E/A ratio:  0.72         Systemic VTI:  0.22 m                             Systemic Diam: 1.90 cm Glori Bickers MD Electronically signed by Glori Bickers MD Signature Date/Time: 11/24/2020/5:15:00 PM    Final    CT HEAD CODE STROKE WO CONTRAST  Result Date: 11/24/2020 CLINICAL DATA:  Code stroke. Neuro deficit, acute, stroke suspected. Left-sided weakness beginning 2.5 hours ago. EXAM: CT HEAD WITHOUT CONTRAST TECHNIQUE: Contiguous axial images were obtained from the base of the skull through the vertex without intravenous contrast. COMPARISON:  MR head 04/03/2020 FINDINGS: Brain: No acute infarct, hemorrhage, or mass lesion is present. Mild atrophy and white matter changes are stable. Basal ganglia are intact. Cortical ribbon is unremarkable. No acute or focal cortical abnormality is present. The ventricles are proportionate to the degree of atrophy. No significant extraaxial fluid collection is present. The brainstem and cerebellum are within normal limits. Vascular: Atherosclerotic calcifications are present within the cavernous  internal carotid arteries. No hyperdense vessel is present. Skull: Calvarium is intact. No focal lytic or blastic lesions are present. No significant extracranial soft tissue lesion is present. Sinuses/Orbits: Minimal mucosal thickening is present the inferior maxillary sinuses bilaterally. No fluid level is present. The paranasal sinuses and mastoid air cells are otherwise clear. Other: ASPECTS ([place] Stroke Program Early CT Score) - Ganglionic level infarction (caudate, lentiform nuclei, internal capsule, insula, M1-M3 cortex): 7/7 - Supraganglionic infarction (M4-M6 cortex): 3/3 Total score (0-10 with 10 being normal): 10/10 IMPRESSION: 1. No acute intracranial abnormality or significant interval change. 2. Stable atrophy and white matter disease. 3. Aspects  10/10 Electronically Signed   By: San Morelle M.D.   On: 11/24/2020 12:53   CT ANGIO HEAD CODE STROKE  Result Date: 11/24/2020 CLINICAL DATA:  Code stroke. Left-sided weakness beginning 6 hours ago. EXAM: CT ANGIOGRAPHY HEAD AND NECK TECHNIQUE: Multidetector CT imaging of the head and neck was performed using the standard protocol during bolus administration of intravenous contrast. Multiplanar CT image reconstructions and MIPs were obtained to evaluate the vascular anatomy. Carotid stenosis measurements (when applicable) are obtained utilizing NASCET criteria, using the distal internal carotid diameter as the denominator. CONTRAST:  86mL OMNIPAQUE IOHEXOL 350 MG/ML SOLN COMPARISON:  CT head without contrast 11/24/2020 FINDINGS: CTA NECK FINDINGS Aortic arch: A 3 vessel arch configuration is present. No significant atherosclerotic change, stenosis, or aneurysm is present. Right carotid system: The right common carotid artery is within normal limits. Mild non calcified atherosclerotic changes are present at the bifurcation without significant stenosis. The cervical right ICA is otherwise normal. Left carotid system: The proximal left common  carotid artery is within normal limits. Minimal mural plaque is present in the midportion of the cervical left ICA. Atherosclerotic changes are noted at the bifurcation without a significant stenosis relative to the more distal vessel. The cervical left ICA is otherwise normal. Vertebral arteries: The left vertebral artery is dominant. Both vertebral arteries originate from the subclavian arteries without significant stenosis. There is no significant stenosis or vascular injury to hilar vertebral artery in the neck. Skeleton: Multilevel endplate degenerative changes uncovertebral spurring is present from C2-3 through C6-7. No focal lytic or blastic lesions are present. Other neck: The soft tissues the neck are otherwise unremarkable. No focal mucosal lesions are present. Scattered lymph nodes appear reactive. Salivary glands are normal. Upper chest: Lung apices are clear. Thoracic inlet is within normal limits. Review of the MIP images confirms the above findings CTA HEAD FINDINGS Anterior circulation: Internal carotid arteries are within normal limits from the skull base through the ICA termini bilaterally. The left A1 segment is dominant. The anterior communicating artery is patent. M1 segments are within normal limits bilaterally. MCA bifurcations are unremarkable. The ACA and MCA branch vessels are normal. Posterior circulation: Left vertebral artery is the dominant vessel. PICA origins are visualized and normal. Vertebrobasilar junction is normal. Basilar artery is small. Left posterior cerebral artery originates from basilar tip. Prominent right posterior communicating artery is present. A right P1 segment contributes to the PCA vessels as well. PCA branch vessels are unremarkable. Venous sinuses: The dural sinuses are patent. The straight sinus and deep cerebral veins are intact. Cortical veins are unremarkable. Anatomic variants: Dominant right posterior communicating artery. Review of the MIP images  confirms the above findings IMPRESSION: 1. Mild atherosclerotic changes at the carotid bifurcations bilaterally without significant stenosis relative to the more distal vessels. 2. Normal variant Circle of Willis without significant proximal stenosis, aneurysm, or branch vessel occlusion. 3. Multilevel spondylosis of the cervical spine. Electronically Signed   By: San Morelle M.D.   On: 11/24/2020 16:22   CT ANGIO NECK CODE STROKE  Result Date: 11/24/2020 CLINICAL DATA:  Code stroke. Left-sided weakness beginning 6 hours ago. EXAM: CT ANGIOGRAPHY HEAD AND NECK TECHNIQUE: Multidetector CT imaging of the head and neck was performed using the standard protocol during bolus administration of intravenous contrast. Multiplanar CT image reconstructions and MIPs were obtained to evaluate the vascular anatomy. Carotid stenosis measurements (when applicable) are obtained utilizing NASCET criteria, using the distal internal carotid diameter as the denominator. CONTRAST:  73mL OMNIPAQUE  IOHEXOL 350 MG/ML SOLN COMPARISON:  CT head without contrast 11/24/2020 FINDINGS: CTA NECK FINDINGS Aortic arch: A 3 vessel arch configuration is present. No significant atherosclerotic change, stenosis, or aneurysm is present. Right carotid system: The right common carotid artery is within normal limits. Mild non calcified atherosclerotic changes are present at the bifurcation without significant stenosis. The cervical right ICA is otherwise normal. Left carotid system: The proximal left common carotid artery is within normal limits. Minimal mural plaque is present in the midportion of the cervical left ICA. Atherosclerotic changes are noted at the bifurcation without a significant stenosis relative to the more distal vessel. The cervical left ICA is otherwise normal. Vertebral arteries: The left vertebral artery is dominant. Both vertebral arteries originate from the subclavian arteries without significant stenosis. There is no  significant stenosis or vascular injury to hilar vertebral artery in the neck. Skeleton: Multilevel endplate degenerative changes uncovertebral spurring is present from C2-3 through C6-7. No focal lytic or blastic lesions are present. Other neck: The soft tissues the neck are otherwise unremarkable. No focal mucosal lesions are present. Scattered lymph nodes appear reactive. Salivary glands are normal. Upper chest: Lung apices are clear. Thoracic inlet is within normal limits. Review of the MIP images confirms the above findings CTA HEAD FINDINGS Anterior circulation: Internal carotid arteries are within normal limits from the skull base through the ICA termini bilaterally. The left A1 segment is dominant. The anterior communicating artery is patent. M1 segments are within normal limits bilaterally. MCA bifurcations are unremarkable. The ACA and MCA branch vessels are normal. Posterior circulation: Left vertebral artery is the dominant vessel. PICA origins are visualized and normal. Vertebrobasilar junction is normal. Basilar artery is small. Left posterior cerebral artery originates from basilar tip. Prominent right posterior communicating artery is present. A right P1 segment contributes to the PCA vessels as well. PCA branch vessels are unremarkable. Venous sinuses: The dural sinuses are patent. The straight sinus and deep cerebral veins are intact. Cortical veins are unremarkable. Anatomic variants: Dominant right posterior communicating artery. Review of the MIP images confirms the above findings IMPRESSION: 1. Mild atherosclerotic changes at the carotid bifurcations bilaterally without significant stenosis relative to the more distal vessels. 2. Normal variant Circle of Willis without significant proximal stenosis, aneurysm, or branch vessel occlusion. 3. Multilevel spondylosis of the cervical spine. Electronically Signed   By: San Morelle M.D.   On: 11/24/2020 16:22      HISTORY OF PRESENT  ILLNESS [name] is a 70 y.o. male with history of HTN, GERD, Depression, mild cognitive impairment. He presents to the ED left side weakness,and numbness.  He drove himself to his PCP and PCP called EMS. He reports uncontrolled BP over the last couple of weeks with medication changes.  CTH with no acute abnormality and no hemorrhage.  He received IV TPA.  HOSPITAL COURSE Patient admitted on 11/24/2020 and received IV TPA initial NIHSS 3. Stroke workup done. CT head without acute abnormality. CTA H/N was obtained and revealed  Mild atherosclerotic changes at the carotid bifurcations bilaterally without significant stenosis relative to the more distal vessels. MRI head  revealed Acute to early subacute right thalamic infarct and  Mild chronic small vessel ischemic disease and moderately advanced cerebral atrophy. Cardiac echo with EF 55-60%. LDL was 87 and was started on atorvastatin 20mg  PO daily. HGBA1c 5.6%. Patient was started on bASA 81mg  daily for secondary stroke prevention.  PT no needs post discharge. OT recommending outpatient OT for LUE  sensation and fine motor skills. Patient was deemed ready and appropriate for discharge.   Patient and wife updated on discharge plans and medications, referrals and future appointments.    Physical Exam  Constitutional: Appears well-developed and well-nourished.  Psych: Affect appropriate to situation Eyes: No scleral injection HENT: No OP obstrucion MSK: no joint deformities.  Cardiovascular: Normal rate and regular rhythm.  Respiratory: Effort normal, non-labored breathing GI: Soft.  No distension. There is no tenderness.  Skin: WDI  Neuro: Mental Status: Patient is awake, alert, oriented to person, place, month, year, and situation. Patient is able to give a clear and coherent history. No signs of aphasia or neglect Cranial Nerves: II: Visual Fields are full. Pupils are equal, round, and reactive to light.   III,IV, VI: EOMI  without ptosis or diploplia.  V: Facial sensation is symmetric to temperature VII: Facial movement is symmetric.  VIII: hearing is intact to voice X: Uvula elevates symmetrically XI: Shoulder shrug is symmetric. XII: tongue is midline without atrophy or fasciculations.  Motor: Tone is normal. Bulk is normal. 5/5 strength was present in all four extremities.  Sensory: Decreased sensation on left side  Cerebellar: FNF and HKS are intact bilaterally  Acute Stroke:  right  thalamic infarct embolic secondary to small vessel disease source   RN Pressure Injury Documentation: NA     DISCHARGE EXAM Blood pressure (!) 141/93, pulse 63, temperature (!) 97.5 F (36.4 C), temperature source Oral, resp. rate 18, height 6' (1.829 m), weight 90.3 kg, SpO2 97 %.   Discharge Diet       Diet   Diet Heart Room service appropriate? Yes; Fluid consistency: Thin   liquids  DISCHARGE PLAN  Disposition:  Home   aspirin 81 mg daily for secondary stroke prevention.  Ongoing stroke risk factor control by Primary Care Physician at time of discharge  Follow-up PCP Nicoletta Dress, MD in 2 weeks.  Follow-up in [facility] in 4 weeks, office to schedule an appointment.   37 minutes were spent preparing discharge.  Beulah Gandy, DNP [id number]-AG

## 2020-11-26 NOTE — Evaluation (Signed)
Speech Language Pathology Evaluation Patient Details Name: Jonathon Parker [facility] MRN: [id number] DOB: [birth year] Today's Date: 11/26/2020 Time: 3818-2993 SLP Time Calculation (min) (ACUTE ONLY): 17.07 min  Problem List:  Patient Active Problem List   Diagnosis Date Noted  . Acute ischemic VBA thalamic stroke, right ([place]) 11/24/2020  . Mild intermittent asthma 01/28/2020  . Dyspnea 05/11/2019  . Thrombocytosis 05/11/2019  . Alcohol dependence ([place]) 05/11/2019  . Abnormal findings on diagnostic imaging of gall bladder 05/10/2019  . Essential hypertension 04/22/2019  . Post-operative complication 71/69/6789  . Acute respiratory failure with hypoxia ([place]) 04/21/2019  . SIRS (systemic inflammatory response syndrome) ([place]) 04/21/2019  . Status post laparoscopic cholecystectomy 04/21/2019  . HCAP (healthcare-associated pneumonia) 04/21/2019  . CLL (chronic lymphocytic leukemia) ([place]) 04/21/2019  . Calculous cholecystitis 02/28/2019  . Campylobacter diarrhea   . Diarrhea of infectious origin   . STEC (Shiga toxin-producing Escherichia coli)   . Elevated LFTs   . Hypokalemia   . Gastroenteritis 01/19/2017  . Sepsis ([place]) 01/19/2017  . GERD (gastroesophageal reflux disease) 01/19/2017  . Depression 01/19/2017   Past Medical History:  Past Medical History:  Diagnosis Date  . Allergy   . Arthritis    not dx'd  . Asthma    mild   . Cholecystitis   . 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 Karle Starch in [facility] hospital    . Depression   . GERD (gastroesophageal reflux disease)    controlled with nexium   . Hypertension    Past Surgical History:  Past Surgical History:  Procedure Laterality Date  . APPENDECTOMY    . CARPAL TUNNEL RELEASE     left   . CHOLECYSTECTOMY N/A 04/20/2019   Procedure: LAPAROSCOPIC CHOLECYSTECTOMY;  Surgeon: Greer Pickerel, MD;  Location: [facility];  Service: General;  Laterality: N/A;  . COLON SURGERY  2002   10 inches of colon  taken out   . COLONOSCOPY    . ENDOSCOPIC RETROGRADE CHOLANGIOPANCREATOGRAPHY (ERCP) WITH PROPOFOL N/A 03/01/2019   Procedure: ENDOSCOPIC RETROGRADE CHOLANGIOPANCREATOGRAPHY (ERCP) WITH PROPOFOL;  Surgeon: Rush Landmark Telford Nab., MD;  Location: [place];  Service: Gastroenterology;  Laterality: N/A;  . ERCP  03/01/2019  . HERNIA REPAIR     bilateral inguinal   . IR CHOLANGIOGRAM EXISTING TUBE  03/25/2019  . IR PERC CHOLECYSTOSTOMY  03/02/2019  . left heel reconstruction  01/2002   17 screws and 2 plates   . POLYPECTOMY    . REMOVAL OF STONES  03/01/2019   Procedure: REMOVAL OF STONES;  Surgeon: Rush Landmark Telford Nab., MD;  Location: [place];  Service: Gastroenterology;;  . Jonathon Parker  03/01/2019   Procedure: Jonathon Parker;  Surgeon: [name], MD;  Location: [facility];  Service: Gastroenterology;;  . tooth extracted     with laughing gas   . UPPER GASTROINTESTINAL ENDOSCOPY     HPI:  Pt is a 70 y.o. male with PMH: asthma, CLL (stage 0), HTN, L heel reconstruction. Pt presented with left sided numbness and weakness. tPA given. MRI brain 4/23: acute to early subacute right thalamic infarct and mild chronic small vessel ischemic disease and moderately advanced cerebral atrophy. Outpatient neuropsych 09/13/20:  "He has MCI, although it is unclear to me whether he is at risk for decline or this is due to less insidious factors such as aging in the setting of learning problems, depression, excessive alcohol use."   Assessment / Plan / Recommendation Clinical Impression  Pt participated in speech/language evaluation with his wife  present. Pt is a retired Media planner. Both parties denied the pt having any acute changes in speech, language, or cognition. However, both reported baseline difficulty with attention and memory for which he was evaluated by neuropsych and diagnosed with MCI. His speech and language skills are currently within normal limits. His  cognitive-linguistic skills were within functional limits considering his level of support at home; however, deficits in memory and attention were noted which appear to be his baseline. Further acute skilled SLP services are not clinically indicated at this time. Pt and his wife were educated regarding results and recommendations; both parties verbalized understanding as well as agreement with plan of care.    SLP Assessment  SLP Recommendation/Assessment: Patient does not need any further Speech Lanaguage Pathology Services SLP Visit Diagnosis: Cognitive communication deficit (R41.841)    Follow Up Recommendations  None    Frequency and Duration           SLP Evaluation Cognition  Overall Cognitive Status: History of cognitive impairments - at baseline Arousal/Alertness: Awake/alert Orientation Level: Oriented X4 Attention: Focused;Sustained;Selective Focused Attention: Appears intact Sustained Attention: Appears intact Selective Attention: Impaired Selective Attention Impairment: Verbal complex Memory: Impaired Memory Impairment: Decreased recall of new information;Retrieval deficit (Immediate:) Awareness: Appears intact Problem Solving: Appears intact       Comprehension  Auditory Comprehension Overall Auditory Comprehension: Appears within functional limits for tasks assessed Yes/No Questions: Within Functional Limits Basic Immediate Environment Questions:  (5/5) Complex Questions:  (5/5) Paragraph Comprehension (via yes/no questions):  (3/4) Commands:  (w) Two Step Basic Commands:  (4/4) Multistep Basic Commands:  (3/3) Visual Recognition/Discrimination Discrimination: Within Function Limits    Expression Verbal Expression Overall Verbal Expression: Appears within functional limits for tasks assessed Initiation: No impairment Automatic Speech: Counting;Day of week;Month of year Level of Generative/Spontaneous Verbalization: Conversation Repetition: No  impairment Naming: No impairment Pragmatics: No impairment Written Expression Dominant Hand: Right   Oral / Motor  Motor Speech Overall Motor Speech: Appears within functional limits for tasks assessed Respiration: Within functional limits Phonation: Normal Resonance: Within functional limits Articulation: Within functional limitis Intelligibility: Intelligible Motor Planning: Witnin functional limits Motor Speech Errors: Not applicable   Chenoah Mcnally I. Hardin Negus, [place], [place] Office number [phone] Pager [phone]                   Horton Marshall 11/26/2020, 3:46 PM

## 2020-11-26 NOTE — Progress Notes (Signed)
Physical Therapy Treatment Patient Details Name: Jonathon Parker [facility] MRN: [id number] DOB: 1950-11-23 Today's Date: 11/26/2020    History of Present Illness Pt is a 70 y.o. M who presents with left sided numbness and weakness. tPA given. 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 Significant PMH: asthma, CLL (stage 0), HTN, L heel reconstruction.    PT Comments    Pt met his physical therapy goals during his inpatient stay. Ambulating hallway distances and negotiated a half flight of stairs without physical therapy. Able to perform high level balance activities such as side stepping, backwards walking, stepping over/around obstacles with no instability noted. Pt remaining deficits include decreased L hand fine motor coordination and decreased L sided sensation. Discussed using L hand as much as possible for functional tasks to promote neuro recovery. No further acute PT needs. Thank you for this consult.     Follow Up Recommendations  No PT follow up     Equipment Recommendations  None recommended by PT    Recommendations for Other Services       Precautions / Restrictions Precautions Precautions: None Restrictions Weight Bearing Restrictions: No    Mobility  Bed Mobility Overal bed mobility: Independent             General bed mobility comments: HOB flat    Transfers Overall transfer level: Independent Equipment used: None Transfers: Sit to/from Stand Sit to Stand: Supervision            Ambulation/Gait Ambulation/Gait assistance: Independent Gait Distance (Feet): 200 Feet Assistive device: None Gait Pattern/deviations: Step-through pattern     General Gait Details: Steady pace, no evidence of instability   Stairs Stairs: Yes Stairs assistance: Modified independent (Device/Increase time) Stair Management: One rail Right Number of Stairs:  10 General stair comments: Step over step pattern.   Wheelchair Mobility    Modified Rankin (Stroke Patients Only) Modified Rankin (Stroke Patients Only) Pre-Morbid Rankin Score: No symptoms Modified Rankin: No significant disability     Balance Overall balance assessment: No apparent balance deficits (not formally assessed)                                          Cognition Arousal/Alertness: Awake/alert Behavior During Therapy: WFL for tasks assessed/performed Overall Cognitive Status: Within Functional Limits for tasks assessed                                 General Comments: Pt drove to MD office instead of calling 911 when symptoms started.      Exercises Other Exercises Other Exercises: Side stepping to right and left, backwards walking x 20 ft each    General Comments General comments (skin integrity, edema, etc.): Spouse in room      Pertinent Vitals/Pain Pain Assessment: No/denies pain    Home Living Family/patient expects to be discharged to:: Private residence Living Arrangements: Spouse/significant other Available Help at Discharge: Family;Available 24 hours/day Type of Home: House Home Access: Stairs to enter Entrance Stairs-Rails: Right Home Layout: Able to live on main level with bedroom/bathroom Home Equipment: None      Prior Function Level of Independence: Independent      Comments: Retired   PT Goals (current goals can now be found in the care plan  section) Acute Rehab PT Goals Patient Stated Goal: return home PT Goal Formulation: With patient Time For Goal Achievement: 12/09/20 Potential to Achieve Goals: Good Progress towards PT goals: Goals met/education completed, patient discharged from PT    Frequency    Min 4X/week      PT Plan Other (comment) (d/c therapies)    Co-evaluation              AM-PAC PT "6 Clicks" Mobility   Outcome Measure  Help needed turning from your back to your  side while in a flat bed without using bedrails?: None Help needed moving from lying on your back to sitting on the side of a flat bed without using bedrails?: None Help needed moving to and from a bed to a chair (including a wheelchair)?: None Help needed standing up from a chair using your arms (e.g., wheelchair or bedside chair)?: None Help needed to walk in hospital room?: None Help needed climbing 3-5 steps with a railing? : None 6 Click Score: 24    End of Session   Activity Tolerance: Patient tolerated treatment well Patient left: with call bell/phone within reach;in bed;with family/visitor present Nurse Communication: Mobility status PT Visit Diagnosis: Other abnormalities of gait and mobility (R26.89);Other symptoms and signs involving the nervous system (R29.898)     Time: 1440-1457 PT Time Calculation (min) (ACUTE ONLY): 17 min  Charges:  $Therapeutic Activity: 8-22 mins                     Wyona Almas, PT, DPT Acute Rehabilitation Services Pager [phone] Office [phone]    Deno Etienne 11/26/2020, 3:11 PM

## 2020-11-27 LAB — PATHOLOGIST SMEAR REVIEW

## 2020-12-01 DIAGNOSIS — E785 Hyperlipidemia, unspecified: Secondary | ICD-10-CM | POA: Diagnosis not present

## 2020-12-01 DIAGNOSIS — I6322 Cerebral infarction due to unspecified occlusion or stenosis of basilar arteries: Secondary | ICD-10-CM | POA: Diagnosis not present

## 2020-12-01 DIAGNOSIS — I10 Essential (primary) hypertension: Secondary | ICD-10-CM | POA: Diagnosis not present

## 2020-12-01 DIAGNOSIS — I63211 Cerebral infarction due to unspecified occlusion or stenosis of right vertebral arteries: Secondary | ICD-10-CM | POA: Diagnosis not present

## 2020-12-04 ENCOUNTER — Telehealth: Payer: Self-pay | Admitting: Neurology

## 2020-12-04 DIAGNOSIS — G8190 Hemiplegia, unspecified affecting unspecified side: Secondary | ICD-10-CM | POA: Diagnosis not present

## 2020-12-04 DIAGNOSIS — R202 Paresthesia of skin: Secondary | ICD-10-CM | POA: Diagnosis not present

## 2020-12-04 DIAGNOSIS — M6281 Muscle weakness (generalized): Secondary | ICD-10-CM | POA: Diagnosis not present

## 2020-12-05 NOTE — Telephone Encounter (Signed)
Patient has been notified will discuss on follow up

## 2020-12-11 DIAGNOSIS — L821 Other seborrheic keratosis: Secondary | ICD-10-CM | POA: Diagnosis not present

## 2020-12-11 DIAGNOSIS — Z85828 Personal history of other malignant neoplasm of skin: Secondary | ICD-10-CM | POA: Diagnosis not present

## 2020-12-11 DIAGNOSIS — L570 Actinic keratosis: Secondary | ICD-10-CM | POA: Diagnosis not present

## 2020-12-11 DIAGNOSIS — D2239 Melanocytic nevi of other parts of face: Secondary | ICD-10-CM | POA: Diagnosis not present

## 2020-12-11 DIAGNOSIS — L2084 Intrinsic (allergic) eczema: Secondary | ICD-10-CM | POA: Diagnosis not present

## 2020-12-11 DIAGNOSIS — D225 Melanocytic nevi of trunk: Secondary | ICD-10-CM | POA: Diagnosis not present

## 2020-12-11 DIAGNOSIS — L814 Other melanin hyperpigmentation: Secondary | ICD-10-CM | POA: Diagnosis not present

## 2020-12-11 DIAGNOSIS — D485 Neoplasm of uncertain behavior of skin: Secondary | ICD-10-CM | POA: Diagnosis not present

## 2020-12-11 DIAGNOSIS — Z08 Encounter for follow-up examination after completed treatment for malignant neoplasm: Secondary | ICD-10-CM | POA: Diagnosis not present

## 2020-12-12 ENCOUNTER — Ambulatory Visit (INDEPENDENT_AMBULATORY_CARE_PROVIDER_SITE_OTHER): Payer: Medicare PPO | Admitting: Neurology

## 2020-12-12 ENCOUNTER — Encounter: Payer: Self-pay | Admitting: Neurology

## 2020-12-12 ENCOUNTER — Other Ambulatory Visit: Payer: Self-pay

## 2020-12-12 VITALS — BP 154/82 | HR 82 | Resp 20 | Ht 72.0 in | Wt 210.0 lb

## 2020-12-12 DIAGNOSIS — G3184 Mild cognitive impairment, so stated: Secondary | ICD-10-CM | POA: Diagnosis not present

## 2020-12-12 DIAGNOSIS — I639 Cerebral infarction, unspecified: Secondary | ICD-10-CM | POA: Diagnosis not present

## 2020-12-12 DIAGNOSIS — I6381 Other cerebral infarction due to occlusion or stenosis of small artery: Secondary | ICD-10-CM

## 2020-12-12 DIAGNOSIS — I10 Essential (primary) hypertension: Secondary | ICD-10-CM | POA: Diagnosis not present

## 2020-12-12 NOTE — Patient Instructions (Addendum)
Good to see you.  1. Referral will be sent to [facility] for physical therapy and occupational therapy [phone]  2. Continue working on BP control, continue daily aspirin, Lipitor  3. Continue Donepezil daily  4. For any sudden changes in symptoms, go to ER immediately  5. Follow-up as scheduled in August, call for any changes

## 2020-12-12 NOTE — Progress Notes (Signed)
NEUROLOGY FOLLOW UP OFFICE NOTE  Jonathon Parker [id number] 10/17/1950  HISTORY OF PRESENT ILLNESS: I had the pleasure of seeing Jonathon Parker in follow-up in the neurology clinic on 70/05/2021.  The patient was last seen 5 months ago for memory loss and presents today for an urgent follow-up after hospitalization for right thalamic stroke. He is again accompanied by his wife who helps supplement the history today.  Records and images were personally reviewed where available.  Symptoms started with left arm numbness on 11/24/20. This progressed to left leg and face numbness, then when he got up he had left arm and leg weakness. He was dragging his left arm and leg but drove himself to his PCP office where EMS was called. He had been having issues controlling his BP and was going to his PCP every 2 weeks with different medication changes, SBP running between 160 and 200. In the ER, his NIHSS was 3, he was given TPA with some improvement in weakness. I personally reviewed MRI brain which showed an acute stroke in the lateral aspect of the right thalamus. There was moderate diffuse atrophy and mild chronic microvascular disease. CTA did not show any significant stenosis. TTE showed an EF of 40-97%, grade I diastolic dysfunction, left atrium mildly dilated. LDL was 87, he was started on atorvastatin 20mg  daily. HbA1c was 5.6. He was discharged home on aspirin 81mg  daily for secondary stroke prevention.   Since hospital discharge last 11/26/20, he went to an occupational therapist in [place] and felt it was not helpful, he states the therapist wanted to focus on his trigger finger and not the stroke. They would like to go to [facility] for therapy. He continues to have sensory changes on the left side and feels he is walking "funny." He has numbness on the left side of his face, food tastes different in the left side of his mouth. His left hand feels cold all the time. The numbness in his leg is not  as bad. There is swelling in his left hand and foot. He reports having a left arm drift in the ER and feels this has improved. No swallowing difficulties. He denies any headaches, dizziness, bowel/bladder dysfunction, no falls. Memory is the same. His wife made a list of his medications, he manages them himself. They are worried about his BP, it is 154/82 today which they report is still his baseline. He is on amlodipine and valsartan. He usually gets 5 hours of sleep and feels somewhat rested. He takes napes 1-2 times a week. His wife reports he snores, no apneic episodes.   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 He is on Donepezil 10mg  daily without side effects.    History on Initial Assessment 07/18/2020: This is a 70 year old right-handed man with a history of CLL, hypertension, depression, asthma, presenting for evaluation of memory loss.  He feels his memory is not as good as it used to be. He started noticing changes 2 years ago, however his wife has noticed changes for at least 5 years. She noticed he would get confused about what she is trying to tell him, saying she is not clear. He occasionally repeats himself. He could not recall if he took his medications so they got a pillbox last year which has helped. He manages his own medications. His wife manages finances. He denies getting lost driving. He would forget  what he went to get in a room. He misplaces things. He has occasional word-finding difficulties. His wife has noticed some personality changes so Zoloft was added to Wellbutrin. He takes 1/2 tab Zoloft due to dizziness on full tablet. No hallucinations. Sleep is good with Trazodone. He feels his mood is "sometimes good." His mother had Alzheimer's disease. No history of significant head injuries. He drinks 2 glasses of alcohol at night.   He has  pain in his neck and shoulders. He has had some bowel issues since colon surgery. He has had bilateral hand tremors for several years, affecting writing and when drinking from a glass. No family history of tremors. He denies any headaches, dizziness, diplopia, dysarthria, dysphagia, back pain, focal numbness/tingling/weakness, bladder dysfunction, anosmia, no falls. 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   MRI brain without contrast done 03/2020 reported advanced atrophy for age. No acute changes. Images unavailable for review. He was started on Donepezil 5mg  daily which he is tolerating without side effects.   Laboratory Data: 05/2020: TSH 1.820, B12 384   PAST MEDICAL HISTORY: Past Medical History:  Diagnosis Date  . Allergy   . Arthritis    not dx'd  . Asthma    mild   . Cholecystitis   . 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 Karle Starch in [facility] hospital    . Depression   . GERD (gastroesophageal reflux disease)    controlled with nexium   . Hypertension     MEDICATIONS: Current Outpatient Medications on File Prior to Visit  Medication Sig Dispense Refill  . albuterol (VENTOLIN HFA) 108 (90 Base) MCG/ACT inhaler Inhale 2 puffs into the lungs every 6 (six) hours as needed for wheezing or shortness of breath. 18 g 0  . amLODipine (NORVASC) 10 MG tablet Take 1 tablet (10 mg total) by mouth daily. 30 tablet 1  . aspirin EC 81 MG EC tablet Take 1 tablet (81 mg total) by mouth daily. Swallow whole. 30 tablet 0  . atorvastatin (LIPITOR) 10 MG tablet Take 2 tablets (20 mg total) by mouth daily at 6 PM. 30 tablet 1  . budesonide-formoterol (SYMBICORT) 80-4.5 MCG/ACT inhaler Inhale 2 puffs into the lungs in the morning and at bedtime. 1 each 0  . buPROPion (WELLBUTRIN XL) 300 MG 24 hr tablet Take 1 tablet (300 mg total) by mouth daily. 30 tablet 0  . docusate sodium (COLACE) 100 MG capsule Take 1 capsule (100 mg total) by  mouth 2 (two) times daily. 60 capsule 0  . donepezil (ARICEPT) 5 MG tablet Take 1 tablet (5 mg total) by mouth daily. 30 tablet 0  . esomeprazole (NEXIUM) 40 MG capsule Take 1 capsule (40 mg total) by mouth daily. 30 capsule 0  . polyethylene glycol powder (GLYCOLAX/MIRALAX) 17 GM/SCOOP powder Take 1 capful of powder mixed in drink by mouth 1 to 3 times daily Until daily soft stools  OTC 225 g 0  . sertraline (ZOLOFT) 50 MG tablet Take 0.5 tablets (25 mg total) by mouth daily. 30 tablet 0  . traZODone (DESYREL) 100 MG tablet Take 1 tablet (100 mg total) by mouth at bedtime. 10 tablet 0  . valsartan (DIOVAN) 320 MG tablet Take 1 tablet (320 mg total) by mouth daily. 30 tablet 0  . cholecalciferol (VITAMIN D3) 25 MCG (1000 UNIT) tablet Take 2 tablets (2,000 Units total) by mouth daily. Takes 2 tablets (1,000 units each) for  a total of 2,000 units daily     No current facility-administered medications on file prior to visit.    ALLERGIES: Allergies  Allergen Reactions  . Lisinopril Cough    FAMILY HISTORY: Family History  Problem Relation Age of Onset  . Prostate cancer Brother   . Colon cancer Neg Hx   . Esophageal cancer Neg Hx   . Colon polyps Neg Hx   . Rectal cancer Neg Hx   . Stomach cancer Neg Hx     SOCIAL HISTORY: Social History   Socioeconomic History  . Marital status: Married    Spouse name: Not on file  . Number of children: Not on file  . Years of education: Not on file  . Highest education level: Not on file  Occupational History  . Not on file  Tobacco Use  . Smoking status: Never Smoker  . Smokeless tobacco: Never Used  Vaping Use  . Vaping Use: Never used  Substance and Sexual Activity  . Alcohol use: Yes    Comment: 1 drink before supper  . Drug use: No  . Sexual activity: Yes  Other Topics Concern  . Not on file  Social History Narrative   Right handed    Lives with wife   Drinks caffeine    Two story home   Social Determinants of Health    Financial Resource Strain: Not on file  Food Insecurity: Not on file  Transportation Needs: Not on file  Physical Activity: Not on file  Stress: Not on file  Social Connections: Not on file  Intimate Partner Violence: Not on file     PHYSICAL EXAM: Vitals:   12/12/20 0837  BP: (!) 154/82  Pulse: 82  Resp: 20  SpO2: 98%   General: No acute distress Head:  Normocephalic/atraumatic Skin/Extremities: No rash, no edema Neurological Exam: alert and awake. No aphasia or dysarthria. Fund of knowledge is appropriate.  Recent and remote memory are intact.  Attention and concentration are normal. Cranial nerves: Pupils equal, round. Extraocular movements intact with no nystagmus. Visual fields full. Facial sensation: reduced pin and cold on left V1-V3. No facial asymmetry.  Motor: Bulk and tone normal, muscle strength 5/5 throughout with no pronator drift, decreased finger taps on left hand. No orbiting. Sensation: increased cold on left UE, otherwise decreased pin on left UE, decreased pin, cold, vibration on left LE. Reflexes +2 throughout except for absent ankle jerks. Toes mute on left, downgoing on right. Finger to nose testing intact.  Gait slow and cautious, appears to favor left leg, unable to tandem walk.    IMPRESSION: This is a pleasant 15 RH man with a history of CLL, hypertension, depression, asthma, Mild Neurocognitive disorder, with recent right thalamic stroke last 11/24/2020, likely due to small vessel disease. Exam today mostly shows left-sided sensory changes affecting daily activities, he also notes changes in gait. He will be set up with physical therapy and occupational therapy at [facility]. We discussed the importance of control of vascular risk factors, continue working on BP, continue statin, aspirin 81mg  daily. He knows to go to the ER for any sudden change in symptoms. Memory has been stable, continue Donepezil 10mg  daily. Follow-up as scheduled in August, call for  any changes.    Thank you for allowing me to participate in his care.  Please do not hesitate to call for any questions or concerns.   Ellouise Newer, M.D.   CC: Dr. Delena Bali

## 2020-12-19 DIAGNOSIS — R197 Diarrhea, unspecified: Secondary | ICD-10-CM | POA: Diagnosis not present

## 2020-12-20 DIAGNOSIS — R197 Diarrhea, unspecified: Secondary | ICD-10-CM | POA: Diagnosis not present

## 2020-12-21 ENCOUNTER — Ambulatory Visit: Payer: Medicare PPO

## 2020-12-21 ENCOUNTER — Encounter: Payer: Self-pay | Admitting: Occupational Therapy

## 2020-12-21 ENCOUNTER — Other Ambulatory Visit: Payer: Self-pay

## 2020-12-21 ENCOUNTER — Ambulatory Visit: Payer: Medicare PPO | Attending: Neurology | Admitting: Occupational Therapy

## 2020-12-21 VITALS — BP 152/88

## 2020-12-21 DIAGNOSIS — R2681 Unsteadiness on feet: Secondary | ICD-10-CM

## 2020-12-21 DIAGNOSIS — R208 Other disturbances of skin sensation: Secondary | ICD-10-CM | POA: Diagnosis not present

## 2020-12-21 DIAGNOSIS — M6281 Muscle weakness (generalized): Secondary | ICD-10-CM | POA: Diagnosis not present

## 2020-12-21 DIAGNOSIS — R278 Other lack of coordination: Secondary | ICD-10-CM | POA: Insufficient documentation

## 2020-12-21 DIAGNOSIS — R2689 Other abnormalities of gait and mobility: Secondary | ICD-10-CM

## 2020-12-21 DIAGNOSIS — R41840 Attention and concentration deficit: Secondary | ICD-10-CM | POA: Diagnosis not present

## 2020-12-21 DIAGNOSIS — R41844 Frontal lobe and executive function deficit: Secondary | ICD-10-CM | POA: Insufficient documentation

## 2020-12-21 NOTE — Therapy (Signed)
[facility] [address] [place], [place], [address] Phone: [phone]   Fax:  [phone]  Occupational Therapy Evaluation  Patient Details  Name: Jonathon Parker [facility] MRN: [id number] Date of Birth: [birth year] Referring Provider (OT): Jonathon Newer, MD   Encounter Date: 12/21/2020   OT End of Session - 12/21/20 1630    Visit Number 1    Number of Visits 9    Date for OT Re-Evaluation 02/01/21    Authorization Type [employer]    Authorization Time Period $20 copay each - PT/OT Auth Req'd    OT Start Time 1530    OT Stop Time 1613    OT Time Calculation (min) 43 min    Activity Tolerance Patient tolerated treatment well    Behavior During Therapy [facility] for tasks assessed/performed           Past Medical History:  Diagnosis Date  . Allergy   . Arthritis    not dx'd  . Asthma    mild   . Cholecystitis   . 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 Karle Parker in [facility] hospital    . Depression   . GERD (gastroesophageal reflux disease)    controlled with nexium   . Hypertension     Past Surgical History:  Procedure Laterality Date  . APPENDECTOMY    . CARPAL TUNNEL RELEASE     left   . CHOLECYSTECTOMY N/A 04/20/2019   Procedure: LAPAROSCOPIC CHOLECYSTECTOMY;  Surgeon: Jonathon Pickerel, MD;  Location: [facility];  Service: General;  Laterality: N/A;  . COLON SURGERY  2002   10 inches of colon taken out   . COLONOSCOPY    . ENDOSCOPIC RETROGRADE CHOLANGIOPANCREATOGRAPHY (ERCP) WITH PROPOFOL N/A 03/01/2019   Procedure: ENDOSCOPIC RETROGRADE CHOLANGIOPANCREATOGRAPHY (ERCP) WITH PROPOFOL;  Surgeon: Jonathon Landmark Telford Nab., MD;  Location: [place];  Service: Gastroenterology;  Laterality: N/A;  . ERCP  03/01/2019  . HERNIA REPAIR     bilateral inguinal   . IR CHOLANGIOGRAM EXISTING TUBE  03/25/2019  . IR PERC CHOLECYSTOSTOMY  03/02/2019  . left heel reconstruction  01/2002   17 screws  and 2 plates   . POLYPECTOMY    . REMOVAL OF STONES  03/01/2019   Procedure: REMOVAL OF STONES;  Surgeon: Jonathon Landmark Telford Nab., MD;  Location: [place];  Service: Gastroenterology;;  . Jonathon Parker  03/01/2019   Procedure: Jonathon Parker;  Surgeon: Jonathon Copas., MD;  Location: [place];  Service: Gastroenterology;;  . tooth extracted     with laughing gas   . UPPER GASTROINTESTINAL ENDOSCOPY      There were no vitals filed for this visit.   Subjective Assessment - 12/21/20 1632    Subjective  Pt reports he "wants to be back to where I once was before this happened but not sure if that's realistic" and reported desire to return to driving which he was doing prior to stroke (and day of stroke). Pt denies any visual changes. Pt and spouse report they were seeing an OT in [place] but he only wanted to focus on his trigger finger which has been an issue for years (much prior to stroke).    Patient is accompanied by: Family member   spouse, Jonathon Parker   Pertinent History PMH: CLL, HTN, EF 55-60%, Depression, Asthma, Cognitive deficits (memory), tremors    Limitations CLL - leukemia, cognitive deficits premorbid    Patient Stated Goals "wants to be back to where I once was  before this happened but not sure if that's realistic" mentioned desire to return to driving    Currently in Pain? No/denies             [facility] OT Assessment - 12/21/20 1535      Assessment   Medical Diagnosis R Thalamic Stroke    Referring Provider (OT) Jonathon Newer, MD    Onset Date/Surgical Date 11/24/20    Hand Dominance Right    Next MD Visit 03/27/21   Dr. Delice Parker   Prior Therapy Acute care. Went to [facility] for OT but reports they only wanted to work on trigger finger so they switched.      Precautions   Precautions Fall    Precaution Comments Memory Deficits prior to Stroke      Balance Screen   Has the patient fallen in the past 6 months No      Home  Environment   Family/patient expects  to be discharged to: Private residence    Living Arrangements Spouse/significant other    Type of [place]   2 step in front   [place] One level    Bathroom Building control surveyor      Prior Function   Level of Independence Independent;Independent with community mobility without device   was driving   Vocation Retired    Technical sales engineer,      ADL   Eating/Feeding Modified independent    Grooming Modified independent    Cabin crew independent    Lower Body Bathing Modified independent    Upper Body Dressing Needs assist for fasteners    Lower Body Dressing Needs assist for fasteners    Toilet Transfer Modified independent    Toileting - Clothing Manipulation Modified independent    [place] Transfer Modified independent      IADL   Shopping Needs to be accompanied on any shopping trip   d/t transportation   Prior Level of Function Light Housekeeping not a whole lot prior    Light Housekeeping Performs light daily tasks such as dishwashing, bed making   housekeeper biweekly and unloads dishwasher   Prior Level of Function Meal Prep did not cook prior    Meal Prep Does not utilize stove or Education officer, environmental Relies on family or friends for transportation    Medication Management Is responsible for taking medication in correct dosages at correct time    Financial Management Dependent;Requires assistance   spouse has been doing them for about 2 years     Written Expression   Dominant Hand Right      Vision - History   Baseline Vision Wears glasses only for reading    Additional Comments pt denies any changes - will continue to assess      Vision Assessment   Comment Continue to assess in functional context      Activity Tolerance   Activity Tolerance Comments "stays tired" per pt report - the case prior to CVA as well d/t CLL       Cognition   Attention Focused    Focused Attention Impaired    Focused Attention Impairment --   Tabletop Scanning 88%   Memory Impaired      Observation/Other Assessments   Focus on Therapeutic Outcomes (FOTO)  63%      Posture/Postural Control   Posture/Postural Control Postural  limitations    Postural Limitations Forward head      Sensation   Light Touch Impaired Detail    Light Touch Impaired Details Impaired LLE;Impaired LUE    Stereognosis Appears Intact    Proprioception Appears Intact;Impaired Detail   intact at great toe. Pt was slightly challenged with thumb find on left.     Coordination   Finger Nose Finger Test undershoots a little with L    9 Hole Peg Test Right;Left    Right 9 Hole Peg Test 26.32s    Left 9 Hole Peg Test 40.16s    Tremors bilaterally - prior to stroke      Perception   Perception --   Tabletop Scanning 88% - more cog deficit not perception     ROM / Strength   AROM / PROM / Strength AROM;Strength      AROM   Overall AROM  Within functional limits for tasks performed    Overall AROM Comments BUE WFL      Strength   Overall Strength Deficits    Overall Strength Comments Grossly 4/5 at elbow and shoulder    Right Hand Gross Grasp Impaired    Left Hand Gross Grasp Impaired    Left Knee Flexion 4/5      Hand Function   Right Hand Gross Grasp Functional    Right Hand Grip (lbs) 63.4    Left Hand Gross Grasp Functional   L long finger trigger finger - present prior to stroke   Left Hand Grip (lbs) 38.1                           OT Education - 12/21/20 1719    Education Details Education on role and purpose of OT    Person(s) Educated Patient    Methods Explanation    Comprehension Verbalized understanding            OT Short Term Goals - 12/21/20 1721      OT SHORT TERM GOAL #1   Title Pt will be independent with HEP for grip strength and coordination    Time 3    Period Weeks    Status New    Target Date  01/11/21      OT SHORT TERM GOAL #2   Title Pt will complete environmental scanning with 90% accuracy or greater for increase in overall attention.    Time 3    Period Weeks    Status New      OT SHORT TERM GOAL #3   Title Pt will increase grip strength in LUE by 5 lbs in order to increase overall functional use of LUE.    Baseline L 38.1, R 63.4    Time 3    Period Weeks    Status New      OT SHORT TERM GOAL #4   Title Pt will improve 9 hole peg test score by 3 seconds in LUE for increase in fine motor coordination    Baseline L 40.16s, R 26.32s    Time 3    Period Weeks    Status New      OT SHORT TERM GOAL #5   Title Pt will verbalize understanding of sensory strategies for safety with ADLs and IADLs.    Time 3    Period Weeks    Status New             OT Long Term Goals -  12/21/20 1724      OT LONG TERM GOAL #1   Title Pt will be independent with proximal strength HEP for BUE    Time 6    Period Weeks    Status New    Target Date 02/01/21      OT LONG TERM GOAL #2   Title Pt will improve fine motor coordination in LUE by completing 9 hole peg test in 35 seconds or less.    Baseline R 40.16s, L 26.32s    Time 6    Period Weeks    Status New      OT LONG TERM GOAL #3   Title Pt will improve grip strength in LUE by 10 lbs in order to increase functional use of LUE.    Baseline R 63.4, L 38.1    Time 6    Period Weeks    Status New      OT LONG TERM GOAL #4   Title Pt will complete physical and cognitive task simultaneously with 90% accuracy or greater for increasing dual task ability.    Time 6    Period Weeks    Status New      OT LONG TERM GOAL #5   Title Pt and spouse will verbalize understanding of return to driving recommendations    Time 6    Period Weeks    Status New                 Plan - 12/21/20 1509    Clinical Impression Statement Pt is a 70 year old that presents to Neuro OPOT s/p right thalamic stroke on 11/24/20 with  residual left sided numbness and weakness. tPA given. MRI showed acute to early subacute right thalamic infarct and mild chronic small vessel ischemic disease and moderately advanced cerebral atrophy. Significant PMH: asthma, CLL (stage 0), HTN, L heel reconstruction. Pt presents with mild sensation deficits in LUE, weakness and decreased coordination. Pt presents with present cognitive deficits that were prior to CVA but continue to impede overall independent and function. Skilled OT is recommended to target listed areas of deficits and increase indepedence and decrease caregiver burden.    OT Occupational Profile and History Detailed Assessment- Review of Records and additional review of physical, cognitive, psychosocial history related to current functional performance    Occupational performance deficits (Please refer to evaluation for details): IADL's;ADL's    Body Structure / Function / Physical Skills ADL;FMC;Strength;UE functional use;Sensation;Coordination;Proprioception;GMC;Dexterity;IADL;Endurance;Decreased knowledge of use of DME    Cognitive Skills Attention;Problem Solve;Memory;Sequencing    Rehab Potential Good    Clinical Decision Making Limited treatment options, no task modification necessary    Comorbidities Affecting Occupational Performance: None    Modification or Assistance to Complete Evaluation  No modification of tasks or assist necessary to complete eval    OT Frequency 2x / week   2x/week for 2 weeks and 1x/week for 4 weeks   OT Duration 6 weeks   6 weeks total   OT Treatment/Interventions Self-care/ADL training;Fluidtherapy;DME and/or AE instruction;Therapeutic exercise;Cognitive remediation/compensation;Therapeutic activities;Passive range of motion;Patient/family education;Manual Therapy;Energy conservation;Neuromuscular education;Functional Mobility Training;Scar mobilization    Plan environmental scanning, sensory strategies, HEP for strengthening (low level theraband?)     Consulted and Agree with Plan of Care Patient;Family member/caregiver    Family Member Consulted spouse, Jonathon Parker           Patient will benefit from skilled therapeutic intervention in order to improve the following deficits and impairments:   Body Structure /  Function / Physical Skills: ADL,FMC,Strength,UE functional use,Sensation,Coordination,Proprioception,GMC,Dexterity,IADL,Endurance,Decreased knowledge of use of DME Cognitive Skills: Attention,Problem Solve,Memory,Sequencing     Visit Diagnosis: Muscle weakness (generalized) - Plan: Ot plan of care cert/re-cert  Other lack of coordination - Plan: Ot plan of care cert/re-cert  Other disturbances of skin sensation - Plan: Ot plan of care cert/re-cert  Attention and concentration deficit - Plan: Ot plan of care cert/re-cert  Frontal lobe and executive function deficit - Plan: Ot plan of care cert/re-cert    Problem List Patient Active Problem List   Diagnosis Date Noted  . Acute ischemic VBA thalamic stroke, right ([place]) 11/24/2020  . Mild intermittent asthma 01/28/2020  . Dyspnea 05/11/2019  . Thrombocytosis 05/11/2019  . Alcohol dependence ([place]) 05/11/2019  . Abnormal findings on diagnostic imaging of gall bladder 05/10/2019  . Essential hypertension 04/22/2019  . Post-operative complication 79/89/2119  . Acute respiratory failure with hypoxia ([place]) 04/21/2019  . SIRS (systemic inflammatory response syndrome) ([place]) 04/21/2019  . Status post laparoscopic cholecystectomy 04/21/2019  . HCAP (healthcare-associated pneumonia) 04/21/2019  . CLL (chronic lymphocytic leukemia) ([place]) 04/21/2019  . Calculous cholecystitis 02/28/2019  . Campylobacter diarrhea   . Diarrhea of infectious origin   . STEC (Shiga toxin-producing Escherichia coli)   . Elevated LFTs   . Hypokalemia   . Gastroenteritis 01/19/2017  . Sepsis ([place]) 01/19/2017  . GERD (gastroesophageal reflux disease) 01/19/2017  . Depression 01/19/2017    Zachery Conch MOT, OTR/L  12/21/2020, 5:28 PM  [facility] [address] [place], [place], [address] Phone: [phone]   Fax:  [phone]  Name: Jonathon Parker [facility] MRN: [id number] Date of Birth: [birth year]

## 2020-12-21 NOTE — Therapy (Signed)
[facility] [address] [place], [place], [address] Phone: [phone]   Fax:  [phone]  Physical Therapy Evaluation  Patient Details  Name: Jonathon Parker [facility] MRN: [id number] Date of Birth: 1951-01-17 Referring Provider (PT): Ellouise Newer   Encounter Date: 12/21/2020   PT End of Session - 12/21/20 1451    Visit Number 1    Number of Visits 9    Date for PT Re-Evaluation 02/15/21    Authorization Type human[employer] medicare    PT Start Time 1448    PT Stop Time 1531    PT Time Calculation (min) 43 min           Past Medical History:  Diagnosis Date  . Allergy   . Arthritis    not dx'd  . Asthma    mild   . Cholecystitis   . 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 Karle Starch in [facility] hospital    . Depression   . GERD (gastroesophageal reflux disease)    controlled with nexium   . Hypertension     Past Surgical History:  Procedure Laterality Date  . APPENDECTOMY    . CARPAL TUNNEL RELEASE     left   . CHOLECYSTECTOMY N/A 04/20/2019   Procedure: LAPAROSCOPIC CHOLECYSTECTOMY;  Surgeon: Greer Pickerel, MD;  Location: [facility];  Service: General;  Laterality: N/A;  . COLON SURGERY  2002   10 inches of colon taken out   . COLONOSCOPY    . ENDOSCOPIC RETROGRADE CHOLANGIOPANCREATOGRAPHY (ERCP) WITH PROPOFOL N/A 03/01/2019   Procedure: ENDOSCOPIC RETROGRADE CHOLANGIOPANCREATOGRAPHY (ERCP) WITH PROPOFOL;  Surgeon: Rush Landmark Telford Nab., MD;  Location: [facility];  Service: Gastroenterology;  Laterality: N/A;  . ERCP  03/01/2019  . HERNIA REPAIR     bilateral inguinal   . IR CHOLANGIOGRAM EXISTING TUBE  03/25/2019  . IR PERC CHOLECYSTOSTOMY  03/02/2019  . left heel reconstruction  01/2002   17 screws and 2 plates   . POLYPECTOMY    . REMOVAL OF STONES  03/01/2019   Procedure: REMOVAL OF STONES;  Surgeon: Rush Landmark Telford Nab., MD;  Location: [facility];  Service:  Gastroenterology;;  . [name]  03/01/2019   Procedure: [name];  Surgeon: [name], MD;  Location: [facility];  Service: Gastroenterology;;  . tooth extracted     with laughing gas   . 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 Reports he developed numbness on left side including face/arm and leg. Went to PCP and was sent to ER. Was hospitalized 4/22-4/24 and was given TPA. Pt reports that at his baseline he has some memory deficits. Reports short term is most impaired but not new since stroke. Pt reports that since coming home he is having trouble holding things with left hand and also not walking like he used to.    Patient is accompained by: Family member   wife   Pertinent History CLL,  HTN, GERD, Depression, mild cognitive impairment.    Patient Stated Goals Pt would like to return to prior level of function with blood pressure behaving as well.    Currently in Pain? No/denies              [facility] PT Assessment - 12/21/20 1456      Assessment   Medical  Diagnosis right thalamic CVA    Referring Provider (PT) Ellouise Newer    Onset Date/Surgical Date 11/24/20    Hand Dominance Right    Prior Therapy Acute care. Went to [facility] for OT but reports they only wanted to work on trigger finger so they switched.      Precautions   Precautions Fall      Balance Screen   Has the patient fallen in the past 6 months No    Has the patient had a decrease in activity level because of a fear of falling?  No    Is the patient reluctant to leave their home because of a fear of falling?  No      Home Environment   Living Environment Private residence    Living Arrangements Spouse/significant other    Available Help at Discharge Family    Type of [place] to enter    Entrance Stairs-Number of Steps [place] One level   does have junk room upstairs but doesn't need to go there     Prior Function   Level of Independence Independent;Independent with community mobility without device   was driving   Vocation Retired      Charity fundraiser Status History of cognitive impairments - at baseline   alert and oriented x 4   Attention Focused    Memory Impaired      Observation/Other Assessments   Focus on Therapeutic Outcomes (FOTO)  72%      Sensation   Light Touch Impaired Detail    Light Touch Impaired Details Impaired LLE;Impaired LUE    Proprioception Appears Intact;Impaired Detail   intact at great toe. Pt was slightly challenged with thumb find on left.     Coordination   Fine Motor Movements are Fluid and Coordinated No   slowed finger opposition     ROM / Strength   AROM / PROM / Strength Strength      Strength   Overall Strength Comments Pt reports history of left ankle surgery. Motion limited to about 90 degrees for DF    Strength Assessment Site Shoulder;Elbow;Hand;Hip;Knee;Ankle    Right/Left Shoulder Right;Left    Right Shoulder Flexion 5/5    Left Shoulder Flexion 4+/5    Right/Left Elbow Right;Left    Right Elbow Flexion 5/5    Right Elbow Extension 5/5    Left Elbow Flexion 4+/5    Left Elbow Extension 4+/5    Right/Left hand Right;Left    Right Hand Gross Grasp Impaired    Left Hand Gross Grasp Impaired    Right/Left Hip Right;Left    Right Hip Flexion 5/5    Right Hip ABduction 5/5    Left Hip Flexion 4+/5    Left Hip ABduction 4+/5    Right/Left Knee Right;Left    Right Knee Flexion 5/5    Right Knee Extension 5/5    Left Knee Flexion 4/5    Left Knee Extension 5/5    Right/Left Ankle Right;Left    Right Ankle Dorsiflexion 5/5    Left Ankle Dorsiflexion 4/5      Transfers   Transfers Sit to Stand;Stand to Sit    Sit to Stand 6: Modified independent (Device/Increase time)    Five time sit to  stand comments  17.38 sec from chair without hands    Stand to Sit 6: Modified independent (Device/Increase time)  Ambulation/Gait   Ambulation/Gait Yes    Ambulation/Gait Assistance 5: Supervision    Ambulation/Gait Assistance Details Pt has decreased toe off on left as well as decreased stance time with shortened right step.    Ambulation Distance (Feet) 200 Feet    Assistive device None    Gait Pattern Step-through pattern;Decreased stance time - left    Ambulation Surface Level;Indoor    Gait velocity 9.27 sec=1.75m/s    Stairs Yes    Stairs Assistance 6: Modified independent (Device/Increase time);5: Supervision    Stairs Assistance Details (indicate cue type and reason) pt performed first set with right rail and then second set without rail    Stair Management Technique No rails;One rail Right;Alternating pattern    Number of Stairs 8    Height of Stairs 6      High Level Balance   High Level Balance Comments SLS 2 sec on right and only briefly on left. Does have history of shattered calcaneous in 2003      Functional Gait  Assessment   Gait assessed  Yes    Gait Level Surface Walks 20 ft in less than 5.5 sec, no assistive devices, good speed, no evidence for imbalance, normal gait pattern, deviates no more than 6 in outside of the 12 in walkway width.    Change in Gait Speed Able to change speed, demonstrates mild gait deviations, deviates 6-10 in outside of the 12 in walkway width, or no gait deviations, unable to achieve a major change in velocity, or uses a change in velocity, or uses an assistive device.    Gait with Horizontal Head Turns Performs head turns smoothly with slight change in gait velocity (eg, minor disruption to smooth gait path), deviates 6-10 in outside 12 in walkway width, or uses an assistive device.    Gait with Vertical Head Turns Performs head turns with no change in gait. Deviates no more than 6 in outside 12 in walkway width.    Gait and Pivot Turn  Pivot turns safely within 3 sec and stops quickly with no loss of balance.    Step Over Obstacle Is able to step over one shoe box (4.5 in total height) without changing gait speed. No evidence of imbalance.    Gait with Narrow Base of Support Ambulates 4-7 steps.    Gait with Eyes Closed Cannot walk 20 ft without assistance, severe gait deviations or imbalance, deviates greater than 15 in outside 12 in walkway width or will not attempt task.    Ambulating Backwards Walks 20 ft, uses assistive device, slower speed, mild gait deviations, deviates 6-10 in outside 12 in walkway width.    Steps Alternating feet, no rail.    Total Score 21                      Objective measurements completed on examination: See above findings.               PT Education - 12/21/20 1831    Education Details PT plan of care    Person(s) Educated Patient    Methods Explanation    Comprehension Verbalized understanding            PT Short Term Goals - 12/21/20 1845      PT SHORT TERM GOAL #1   Title Pt will decrease 5 x sit to stand from 17.83 sec to <15 sec for improved balance and functional strength.    Baseline 12/21/20 17.83 sec  from chair without hands    Time 3    Period Weeks    Status New    Target Date 01/11/21      PT SHORT TERM GOAL #2   Title Pt will ambulate >500' on varied surfaces supervision for improved community mobility.    Time 3    Period Weeks    Status New    Target Date 01/11/21      PT SHORT TERM GOAL #3   Title Pt will ambulate up/down 8 steps independently in reciprocal pattern with rails for improved community access.    Time 3    Period Weeks    Status New    Target Date 01/11/21             PT Long Term Goals - 12/21/20 1847      PT LONG TERM GOAL #1   Title Pt will be independent with HEP for balance and strengthening to continue gains on own.    Time 6    Period Weeks    Status New    Target Date 02/01/21      PT LONG TERM  GOAL #2   Title Pt will increase gait speed to >1.3937m/s for improved gait safety in community.    Baseline 12/21/20 1.7859m/s    Time 6    Period Weeks    Status New    Target Date 01/11/21      PT LONG TERM GOAL #3   Title Pt will increas FGA to >24/30 for improved gait safety.    Baseline 12/21/20 21/30    Time 6    Period Weeks    Status New    Target Date 02/01/21      PT LONG TERM GOAL #4   Title Pt will ambulate >1000' on varied surfaces independently for improved community mobility.    Time 6    Period Weeks    Status New    Target Date 02/01/21      PT LONG TERM GOAL #5   Title Pt will incresae FOTO to 77% for decreased self perceived disability.    Baseline 12/21/20 72%    Time 6    Period Weeks    Status New    Target Date 02/01/21                  Plan - 12/21/20 1834    Clinical Impression Statement Pt is 70 y/o male with right thalamic stroke. Pt presents with mild left hemiparesis with biggest deficit in left leg at hamstring with 4/5 strength. He has impaired sensation on left compared to right with UE more affected than lower extremity. Pt is medium fall risk based on FGA score of 21/30 and 5 x sit to stand of 17.83 sec. He is currently ambulating without AD at gait speed of 1.7559m/s which is safe community speed but decreased compared to norms. Pt will benefit from skilled PT to address strength and balance deficits to maximize independence and help return to prior level of function.    Personal Factors and Comorbidities Comorbidity 3+    Comorbidities HTN, GERD, Depression, mild cognitive impairment, CLL    Examination-Activity Limitations Stairs;Locomotion Level    Examination-Participation Restrictions Community Activity;Yard Work;Driving    Stability/Clinical Decision Making Evolving/Moderate complexity    Clinical Decision Making Moderate    Rehab Potential Good    PT Frequency 2x / week   followed by 1x/week for 4 weeks. Plus initial eval.  PT  Duration 2 weeks    PT Treatment/Interventions Neuromuscular re-education;Balance training;Therapeutic exercise;Therapeutic activities;Functional mobility training;Gait training;ADLs/Self Care Home Management;Patient/family education;Manual techniques;Passive range of motion;Vestibular    PT Next Visit Plan Monitor BP. Begin high level balance training (tandem gait, gait with horizontal head turns, eyes closed activities, compliant surfaces), left hamstring strengthening, gait on varied surfaces. Initiate HEP. Pt's schedule will need to be adjusted as is planned for 2w2, 1w4 and looks like they scheduled him out twice a week throughout. I can correct next time I see him Chloe.    Consulted and Agree with Plan of Care Patient;Family member/caregiver    Family Member Consulted wife           Patient will benefit from skilled therapeutic intervention in order to improve the following deficits and impairments:  Abnormal gait,Decreased balance,Decreased activity tolerance,Decreased mobility,Decreased strength,Impaired sensation  Visit Diagnosis: Other abnormalities of gait and mobility  Muscle weakness (generalized)  Unsteadiness on feet     Problem List Patient Active Problem List   Diagnosis Date Noted  . Acute ischemic VBA thalamic stroke, right ([place]) 11/24/2020  . Mild intermittent asthma 01/28/2020  . Dyspnea 05/11/2019  . Thrombocytosis 05/11/2019  . Alcohol dependence ([place]) 05/11/2019  . Abnormal findings on diagnostic imaging of gall bladder 05/10/2019  . Essential hypertension 04/22/2019  . Post-operative complication 61/44/3154  . Acute respiratory failure with hypoxia ([place]) 04/21/2019  . SIRS (systemic inflammatory response syndrome) ([place]) 04/21/2019  . Status post laparoscopic cholecystectomy 04/21/2019  . HCAP (healthcare-associated pneumonia) 04/21/2019  . CLL (chronic lymphocytic leukemia) ([place]) 04/21/2019  . Calculous cholecystitis 02/28/2019  . Campylobacter  diarrhea   . Diarrhea of infectious origin   . STEC (Shiga toxin-producing Escherichia coli)   . Elevated LFTs   . Hypokalemia   . Gastroenteritis 01/19/2017  . Sepsis ([place]) 01/19/2017  . GERD (gastroesophageal reflux disease) 01/19/2017  . Depression 01/19/2017    Electa Sniff, PT, DPT, NCS 12/21/2020, 6:53 PM  [facility] [address] [place], [place], [address] Phone: [phone]   Fax:  [phone]  Name: Jonathon Parker [facility] MRN: [id number] Date of Birth: [birth year]

## 2020-12-29 ENCOUNTER — Ambulatory Visit: Payer: Medicare PPO | Admitting: Physical Therapy

## 2020-12-29 ENCOUNTER — Encounter: Payer: Self-pay | Admitting: Physical Therapy

## 2020-12-29 ENCOUNTER — Encounter: Payer: Self-pay | Admitting: Occupational Therapy

## 2020-12-29 ENCOUNTER — Other Ambulatory Visit: Payer: Self-pay

## 2020-12-29 ENCOUNTER — Ambulatory Visit: Payer: Medicare PPO | Admitting: Occupational Therapy

## 2020-12-29 VITALS — BP 138/69 | HR 49

## 2020-12-29 DIAGNOSIS — R2681 Unsteadiness on feet: Secondary | ICD-10-CM | POA: Diagnosis not present

## 2020-12-29 DIAGNOSIS — R278 Other lack of coordination: Secondary | ICD-10-CM

## 2020-12-29 DIAGNOSIS — R2689 Other abnormalities of gait and mobility: Secondary | ICD-10-CM | POA: Diagnosis not present

## 2020-12-29 DIAGNOSIS — R41844 Frontal lobe and executive function deficit: Secondary | ICD-10-CM | POA: Diagnosis not present

## 2020-12-29 DIAGNOSIS — R41840 Attention and concentration deficit: Secondary | ICD-10-CM

## 2020-12-29 DIAGNOSIS — M6281 Muscle weakness (generalized): Secondary | ICD-10-CM

## 2020-12-29 DIAGNOSIS — R208 Other disturbances of skin sensation: Secondary | ICD-10-CM | POA: Diagnosis not present

## 2020-12-29 NOTE — Patient Instructions (Signed)
1. Grip Strengthening (Resistive Putty)   Squeeze putty using thumb and all fingers. Repeat _20___ times. Do __2__ sessions per day.   2. Roll putty into tube on table and pinch between each finger and thumb x 10 reps each. (can do ring and small finger together)    Coordination Activities  Perform the following activities for 20 minutes 2 times per day with left hand(s).  Rotate ball in fingertips (clockwise and counter-clockwise). Toss ball between hands. Toss ball in air and catch with the same hand. Flip cards 1 at a time as fast as you can. Deal cards with your thumb (Hold deck in hand and push card off top with thumb). Rotate card in hand (clockwise and counter-clockwise). Pick up coins, buttons, marbles, dried beans/pasta of different sizes and place in container. Pick up coins and place in container or coin bank. Pick up coins and stack. Pick up coins one at a time until you get 5-10 in your hand, then move coins from palm to fingertips to stack one at a time. Twirl pen between fingers. Screw together nuts and bolts, then unfasten.  Copyright  VHI. All rights reserved.

## 2020-12-29 NOTE — Therapy (Signed)
[facility] [address] [place], [place], [address] Phone: [phone]   Fax:  [phone]  Occupational Therapy Treatment  Patient Details  Name: Jonathon Parker MRN: [id number] Date of Birth: 07-01-1951 Referring Provider (OT): Ellouise Newer, MD   Encounter Date: 12/29/2020   OT End of Session - 12/29/20 1323    Visit Number 2    Number of Visits 9    Date for OT Re-Evaluation 02/01/21    Authorization Type [employer]    Authorization Time Period $20 copay each - PT/OT Auth Req'd    OT Start Time 1322    OT Stop Time 1400    OT Time Calculation (min) 38 min    Activity Tolerance Patient tolerated treatment well    Behavior During Therapy [facility] for tasks assessed/performed           Past Medical History:  Diagnosis Date  . Allergy   . Arthritis    not dx'd  . Asthma    mild   . Cholecystitis   . 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 Karle Starch in [facility]    . Depression   . GERD (gastroesophageal reflux disease)    controlled with nexium   . Hypertension     Past Surgical History:  Procedure Laterality Date  . APPENDECTOMY    . CARPAL TUNNEL RELEASE     left   . CHOLECYSTECTOMY N/A 04/20/2019   Procedure: LAPAROSCOPIC CHOLECYSTECTOMY;  Surgeon: Greer Pickerel, MD;  Location: [facility];  Service: General;  Laterality: N/A;  . COLON SURGERY  2002   10 inches of colon taken out   . COLONOSCOPY    . ENDOSCOPIC RETROGRADE CHOLANGIOPANCREATOGRAPHY (ERCP) WITH PROPOFOL N/A 03/01/2019   Procedure: ENDOSCOPIC RETROGRADE CHOLANGIOPANCREATOGRAPHY (ERCP) WITH PROPOFOL;  Surgeon: Rush Landmark Telford Nab., MD;  Location: [place];  Service: Gastroenterology;  Laterality: N/A;  . ERCP  03/01/2019  . HERNIA REPAIR     bilateral inguinal   . IR CHOLANGIOGRAM EXISTING TUBE  03/25/2019  . IR PERC CHOLECYSTOSTOMY  03/02/2019  . left heel reconstruction  01/2002   17 screws  and 2 plates   . POLYPECTOMY    . REMOVAL OF STONES  03/01/2019   Procedure: REMOVAL OF STONES;  Surgeon: Rush Landmark Telford Nab., MD;  Location: [place];  Service: Gastroenterology;;  . [name]  03/01/2019   Procedure: [name];  Surgeon: [name], MD;  Location: [place];  Service: Gastroenterology;;  . tooth extracted     with laughing gas   . UPPER GASTROINTESTINAL ENDOSCOPY      There were no vitals filed for this visit.   Subjective Assessment - 12/29/20 1324    Subjective  Pt said PT was "frustrating". "no more than usual" when speaking of pain.    Patient is accompanied by: Family member   spouse, [name]   Pertinent History PMH: CLL, HTN, EF 55-60%, Depression, Asthma, Cognitive deficits (memory), tremors    Limitations CLL - leukemia, cognitive deficits premorbid    Patient Stated Goals "wants to be back to where I once was before this happened but not sure if that's realistic" mentioned desire to return to driving    Currently in Pain? No/denies               Grooved Pegs with LUE with min difficulty and min drops.          1. Grip Strengthening (Resistive Putty)   Squeeze  putty using thumb and all fingers. Repeat _20___ times. Do __2__ sessions per day.   2. Roll putty into tube on table and pinch between each finger and thumb x 10 reps each. (can do ring and small finger together)    Coordination Activities  Perform the following activities for 20 minutes 2 times per day with left hand(s).  Rotate ball in fingertips (clockwise and counter-clockwise). Toss ball between hands. Toss ball in air and catch with the same hand. Flip cards 1 at a time as fast as you can. Deal cards with your thumb (Hold deck in hand and push card off top with thumb). Rotate card in hand (clockwise and counter-clockwise). Pick up coins, buttons, marbles, dried beans/pasta of different sizes and place in container. Pick up coins and place  in container or coin bank. Pick up coins and stack. Pick up coins one at a time until you get 5-10 in your hand, then move coins from palm to fingertips to stack one at a time. Twirl pen between fingers. Screw together nuts and bolts, then unfasten.           OT Education - 12/29/20 1346    Education Details [employer], Coordination HEP - see pt instructions    Person(s) Educated Patient;Spouse    Methods Explanation;Handout;Demonstration    Comprehension Verbalized understanding;Returned demonstration            OT Short Term Goals - 12/29/20 1351      OT SHORT TERM GOAL #1   Title Pt will be independent with HEP for grip strength and coordination    Time 3    Period Weeks    Status On-going    Target Date 01/11/21      OT SHORT TERM GOAL #2   Title Pt will complete environmental scanning with 90% accuracy or greater for increase in overall attention.    Time 3    Period Weeks    Status New      OT SHORT TERM GOAL #3   Title Pt will increase grip strength in LUE by 5 lbs in order to increase overall functional use of LUE.    Baseline L 38.1, R 63.4    Time 3    Period Weeks    Status New      OT SHORT TERM GOAL #4   Title Pt will improve 9 hole peg test score by 3 seconds in LUE for increase in fine motor coordination    Baseline L 40.16s, R 26.32s    Time 3    Period Weeks    Status New      OT SHORT TERM GOAL #5   Title Pt will verbalize understanding of sensory strategies for safety with ADLs and IADLs.    Time 3    Period Weeks    Status New             OT Long Term Goals - 12/21/20 1724      OT LONG TERM GOAL #1   Title Pt will be independent with proximal strength HEP for BUE    Time 6    Period Weeks    Status New    Target Date 02/01/21      OT LONG TERM GOAL #2   Title Pt will improve fine motor coordination in LUE by completing 9 hole peg test in 35 seconds or less.    Baseline R 40.16s, L 26.32s    Time 6    Period  Weeks     Status New      OT LONG TERM GOAL #3   Title Pt will improve grip strength in LUE by 10 lbs in order to increase functional use of LUE.    Baseline R 63.4, L 38.1    Time 6    Period Weeks    Status New      OT LONG TERM GOAL #4   Title Pt will complete physical and cognitive task simultaneously with 90% accuracy or greater for increasing dual task ability.    Time 6    Period Weeks    Status New      OT LONG TERM GOAL #5   Title Pt and spouse will verbalize understanding of return to driving recommendations    Time 6    Period Weeks    Status New                 Plan - 12/29/20 1348    Clinical Impression Statement Pt agreeable to goals set at evaluation.    OT Occupational Profile and History Detailed Assessment- Review of Records and additional review of physical, cognitive, psychosocial history related to current functional performance    Occupational performance deficits (Please refer to evaluation for details): IADL's;ADL's    Body Structure / Function / Physical Skills ADL;FMC;Strength;UE functional use;Sensation;Coordination;Proprioception;GMC;Dexterity;IADL;Endurance;Decreased knowledge of use of DME    Cognitive Skills Attention;Problem Solve;Memory;Sequencing    Rehab Potential Good    Clinical Decision Making Limited treatment options, no task modification necessary    Comorbidities Affecting Occupational Performance: None    Modification or Assistance to Complete Evaluation  No modification of tasks or assist necessary to complete eval    OT Frequency 2x / week   2x/week for 2 weeks and 1x/week for 4 weeks   OT Duration 6 weeks   6 weeks total   OT Treatment/Interventions Self-care/ADL training;Fluidtherapy;DME and/or AE instruction;Therapeutic exercise;Cognitive remediation/compensation;Therapeutic activities;Passive range of motion;Patient/family education;Manual Therapy;Energy conservation;Neuromuscular education;Functional Mobility Training;Scar mobilization     Plan environmental scanning, HEP for strengthening (low level theraband?)    Consulted and Agree with Plan of Care Patient;Family member/caregiver    Family Member Consulted spouse, [name]           Patient will benefit from skilled therapeutic intervention in order to improve the following deficits and impairments:   Body Structure / Function / Physical Skills: ADL,FMC,Strength,UE functional use,Sensation,Coordination,Proprioception,GMC,Dexterity,IADL,Endurance,Decreased knowledge of use of DME Cognitive Skills: Attention,Problem Solve,Memory,Sequencing     Visit Diagnosis: Muscle weakness (generalized)  Unsteadiness on feet  Other lack of coordination  Attention and concentration deficit  Frontal lobe and executive function deficit  Other disturbances of skin sensation    Problem List Patient Active Problem List   Diagnosis Date Noted  . Acute ischemic VBA thalamic stroke, right ([place]) 11/24/2020  . Mild intermittent asthma 01/28/2020  . Dyspnea 05/11/2019  . Thrombocytosis 05/11/2019  . Alcohol dependence ([place]) 05/11/2019  . Abnormal findings on diagnostic imaging of gall bladder 05/10/2019  . Essential hypertension 04/22/2019  . Post-operative complication 40/34/7425  . Acute respiratory failure with hypoxia ([place]) 04/21/2019  . SIRS (systemic inflammatory response syndrome) ([place]) 04/21/2019  . Status post laparoscopic cholecystectomy 04/21/2019  . HCAP (healthcare-associated pneumonia) 04/21/2019  . CLL (chronic lymphocytic leukemia) ([place]) 04/21/2019  . Calculous cholecystitis 02/28/2019  . Campylobacter diarrhea   . Diarrhea of infectious origin   . STEC (Shiga toxin-producing Escherichia coli)   . Elevated LFTs   . Hypokalemia   . Gastroenteritis 01/19/2017  .  Sepsis ([place]) 01/19/2017  . GERD (gastroesophageal reflux disease) 01/19/2017  . Depression 01/19/2017    Zachery Conch MOT, OTR/L  12/29/2020, 7:00 PM  [facility] [address] [place], [place], [address] Phone: [phone]   Fax:  [phone]  Name: Jonathon Parker MRN: [id number] Date of Birth: September 25, 1950

## 2020-12-29 NOTE — Therapy (Signed)
Jonathon Parker [address] [place], [place], [address] Phone: [phone]   Fax:  [phone]  Physical Therapy Treatment  Patient Details  Name: Jonathon Pasha [facility] MRN: [id number] Date of Birth: [birth year] Referring Provider (PT): Ellouise Newer   Encounter Date: 12/29/2020   PT End of Session - 12/29/20 1202    Visit Number 2    Number of Visits 9    Date for PT Re-Evaluation 02/15/21    Authorization Type [employer] medicare    PT Start Time 1055    PT Stop Time 1140    PT Time Calculation (min) 45 min    Activity Tolerance Patient tolerated treatment well    Behavior During Therapy [facility] for tasks assessed/performed           Past Medical History:  Diagnosis Date  . Allergy   . Arthritis    not dx'd  . Asthma    mild   . Cholecystitis   . 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 Karle Starch in [facility] hospital    . Depression   . GERD (gastroesophageal reflux disease)    controlled with nexium   . Hypertension     Past Surgical History:  Procedure Laterality Date  . APPENDECTOMY    . CARPAL TUNNEL RELEASE     left   . CHOLECYSTECTOMY N/A 04/20/2019   Procedure: LAPAROSCOPIC CHOLECYSTECTOMY;  Surgeon: Greer Pickerel, MD;  [facility];  Service: General;  Laterality: N/A;  . COLON SURGERY  2002   10 inches of colon taken out   . COLONOSCOPY    . ENDOSCOPIC RETROGRADE CHOLANGIOPANCREATOGRAPHY (ERCP) WITH PROPOFOL N/A 03/01/2019   Procedure: ENDOSCOPIC RETROGRADE CHOLANGIOPANCREATOGRAPHY (ERCP) WITH PROPOFOL;  Surgeon: Rush Landmark Telford Nab., MD;  Location: [facility];  Service: Gastroenterology;  Laterality: N/A;  . ERCP  03/01/2019  . HERNIA REPAIR     bilateral inguinal   . IR CHOLANGIOGRAM EXISTING TUBE  03/25/2019  . IR PERC CHOLECYSTOSTOMY  03/02/2019  . left heel reconstruction  01/2002   17 screws and 2 plates   . POLYPECTOMY    . REMOVAL OF STONES  03/01/2019    Procedure: REMOVAL OF STONES;  Surgeon: Rush Landmark Telford Nab., MD;  Location: [facility];  Service: Gastroenterology;;  . Jonathon Parker  03/01/2019   Procedure: Jonathon Parker;  Surgeon: [name], MD;  Location: [place];  Service: Gastroenterology;;  . tooth extracted     with laughing gas   . UPPER GASTROINTESTINAL ENDOSCOPY      Vitals:   12/29/20 1100  BP: 138/69  Pulse: (!) 49     Subjective Assessment - 12/29/20 1057    Subjective No changes since he was last here.    Patient is accompained by: Family member   wife   Pertinent History CLL,  HTN, GERD, Depression, mild cognitive impairment.    Patient Stated Goals Pt would like to return to prior level of function with blood pressure behaving as well.    Currently in Pain? No/denies                                  Balance Exercises - 12/29/20 0001      Balance Exercises: Standing   Standing Eyes Closed Wide (BOA);Foam/compliant surface;Limitations    Standing Eyes Closed Limitations feet approx. hip width 3 x 30 seconds on blue air ex    Rockerboard Lateral;EO;Limitations  Rockerboard Limitations keeping board still x30 seconds, weight shifting x10 reps, x5 reps head turns, x5 reps head nods - with intermittent taps to bar as needed for balance    Other Standing Exercises on blue air ex: x10 reps mini squats    Other Standing Exercises Comments on blue mat down and back 3 reps: side stepping - cues for incr foot clearance, retro gait - cues for even step length           Access Code: [facility] URL: [email or web address] Date: 12/29/2020 Prepared by: Janann August  Initiated HEP - see MedBridge for further details.   Exercises Tandem Stance - 1 x daily - 5 x weekly - 3 sets - 15-20 hold Tandem Walking with Counter Support - 1 x daily - 5 x weekly - 3 sets Standing Marching - 1 x daily - 5 x weekly - 3 sets Standing Balance with Eyes Closed on Foam - 1  x daily - 5 x weekly - 3 sets - 30 hold Romberg Stance on Foam Pad with Head Rotation - 1 x daily - 5 x weekly - 2 sets - 10 reps   PT Education - 12/29/20 1202    Education Details initial HEP for balance    Person(s) Educated Patient;Spouse    Methods Explanation;Demonstration;Handout    Comprehension Verbalized understanding;Returned demonstration            PT Short Term Goals - 12/21/20 1845      PT SHORT TERM GOAL #1   Title Pt will decrease 5 x sit to stand from 17.83 sec to <15 sec for improved balance and functional strength.    Baseline 12/21/20 17.83 sec from chair without hands    Time 3    Period Weeks    Status New    Target Date 01/11/21      PT SHORT TERM GOAL #2   Title Pt will ambulate >500' on varied surfaces supervision for improved community mobility.    Time 3    Period Weeks    Status New    Target Date 01/11/21      PT SHORT TERM GOAL #3   Title Pt will ambulate up/down 8 steps independently in reciprocal pattern with rails for improved community access.    Time 3    Period Weeks    Status New    Target Date 01/11/21             PT Long Term Goals - 12/21/20 1847      PT LONG TERM GOAL #1   Title Pt will be independent with HEP for balance and strengthening to continue gains on own.    Time 6    Period Weeks    Status New    Target Date 02/01/21      PT LONG TERM GOAL #2   Title Pt will increase gait speed to >1.7m/s for improved gait safety in community.    Baseline 12/21/20 1.77m/s    Time 6    Period Weeks    Status New    Target Date 01/11/21      PT LONG TERM GOAL #3   Title Pt will increas FGA to >24/30 for improved gait safety.    Baseline 12/21/20 21/30    Time 6    Period Weeks    Status New    Target Date 02/01/21      PT LONG TERM GOAL #4   Title Pt will ambulate >1000' on varied surfaces  independently for improved community mobility.    Time 6    Period Weeks    Status New    Target Date 02/01/21      PT LONG  TERM GOAL #5   Title Pt will incresae FOTO to 77% for decreased self perceived disability.    Baseline 12/21/20 72%    Time 6    Period Weeks    Status New    Target Date 02/01/21                 Plan - 12/29/20 1207    Clinical Impression Statement Today's skilled session focused on initiating HEP for standing balance with focus on compliant surfaces, vision removed, head motions, and narow BOS. Remainder of session focused on balance strategies on unlevel surfaces, pt challenged by rockerboard. Pt tolerated session well, will continue to progress towards LTGs.    Personal Factors and Comorbidities Comorbidity 3+    Comorbidities HTN, GERD, Depression, mild cognitive impairment, CLL    Examination-Activity Limitations Stairs;Locomotion Level    Examination-Participation Restrictions Community Activity;Yard Work;Driving    Stability/Clinical Decision Making Evolving/Moderate complexity    Rehab Potential Good    PT Frequency 2x / week   followed by 1x/week for 4 weeks. Plus initial eval.   PT Duration 2 weeks    PT Treatment/Interventions Neuromuscular re-education;Balance training;Therapeutic exercise;Therapeutic activities;Functional mobility training;Gait training;ADLs/Self Care Home Management;Patient/family education;Manual techniques;Passive range of motion;Vestibular    PT Next Visit Plan Monitor BP. how is HEP? continue high level balance training (tandem gait, gait with horizontal head turns, eyes closed activities, compliant surfaces) left hamstring strengthening, gait on varied surfaces. Pt's schedule will need to be adjusted as is planned for 2w2, 1w4 and looks like they scheduled him out twice a week throughout. I can correct next time I see him Joshva Labreck.    PT Home Exercise Plan [place] and Agree with Plan of Care Patient;Family member/caregiver    Family Member Consulted wife           Patient will benefit from skilled therapeutic intervention in order to  improve the following deficits and impairments:  Abnormal gait,Decreased balance,Decreased activity tolerance,Decreased mobility,Decreased strength,Impaired sensation  Visit Diagnosis: Other abnormalities of gait and mobility  Muscle weakness (generalized)  Unsteadiness on feet  Other lack of coordination     Problem List Patient Active Problem List   Diagnosis Date Noted  . Acute ischemic VBA thalamic stroke, right ([place]) 11/24/2020  . Mild intermittent asthma 01/28/2020  . Dyspnea 05/11/2019  . Thrombocytosis 05/11/2019  . Alcohol dependence ([place]) 05/11/2019  . Abnormal findings on diagnostic imaging of gall bladder 05/10/2019  . Essential hypertension 04/22/2019  . Post-operative complication 89/21/1941  . Acute respiratory failure with hypoxia ([place]) 04/21/2019  . SIRS (systemic inflammatory response syndrome) ([place]) 04/21/2019  . Status post laparoscopic cholecystectomy 04/21/2019  . HCAP (healthcare-associated pneumonia) 04/21/2019  . CLL (chronic lymphocytic leukemia) ([place]) 04/21/2019  . Calculous cholecystitis 02/28/2019  . Campylobacter diarrhea   . Diarrhea of infectious origin   . STEC (Shiga toxin-producing Escherichia coli)   . Elevated LFTs   . Hypokalemia   . Gastroenteritis 01/19/2017  . Sepsis ([place]) 01/19/2017  . GERD (gastroesophageal reflux disease) 01/19/2017  . Depression 01/19/2017    Arliss Journey, PT, DPT  12/29/2020, 12:08 PM  [facility] [address] [place], [place], [address] Phone: [phone]   Fax:  [phone]  Name: Lewellyn Fultz [facility] MRN: [id number] Date  of Birth: 07/23/1951

## 2020-12-29 NOTE — Patient Instructions (Signed)
Access Code: [facility] URL: [email or web address] Date: 12/29/2020 Prepared by: Janann August  Exercises Tandem Stance - 1 x daily - 5 x weekly - 3 sets - 15-20 hold Tandem Walking with Counter Support - 1 x daily - 5 x weekly - 3 sets Standing Marching - 1 x daily - 5 x weekly - 3 sets Standing Balance with Eyes Closed on Foam - 1 x daily - 5 x weekly - 3 sets - 30 hold Romberg Stance on Foam Pad with Head Rotation - 1 x daily - 5 x weekly - 2 sets - 10 reps

## 2021-01-02 DIAGNOSIS — I10 Essential (primary) hypertension: Secondary | ICD-10-CM | POA: Diagnosis not present

## 2021-01-03 ENCOUNTER — Other Ambulatory Visit: Payer: Self-pay

## 2021-01-03 ENCOUNTER — Ambulatory Visit: Payer: Medicare PPO | Attending: Neurology | Admitting: Occupational Therapy

## 2021-01-03 ENCOUNTER — Encounter: Payer: Self-pay | Admitting: Occupational Therapy

## 2021-01-03 ENCOUNTER — Ambulatory Visit: Payer: Medicare PPO

## 2021-01-03 DIAGNOSIS — R2681 Unsteadiness on feet: Secondary | ICD-10-CM | POA: Diagnosis not present

## 2021-01-03 DIAGNOSIS — R2689 Other abnormalities of gait and mobility: Secondary | ICD-10-CM | POA: Diagnosis not present

## 2021-01-03 DIAGNOSIS — R208 Other disturbances of skin sensation: Secondary | ICD-10-CM | POA: Diagnosis not present

## 2021-01-03 DIAGNOSIS — R278 Other lack of coordination: Secondary | ICD-10-CM | POA: Diagnosis not present

## 2021-01-03 DIAGNOSIS — R41840 Attention and concentration deficit: Secondary | ICD-10-CM | POA: Insufficient documentation

## 2021-01-03 DIAGNOSIS — M6281 Muscle weakness (generalized): Secondary | ICD-10-CM | POA: Insufficient documentation

## 2021-01-03 DIAGNOSIS — R41844 Frontal lobe and executive function deficit: Secondary | ICD-10-CM | POA: Insufficient documentation

## 2021-01-03 NOTE — Therapy (Signed)
[facility] [address] [place], [place], [address] Phone: [phone]   Fax:  [phone]  Occupational Therapy Treatment  Patient Details  Name: Jonathon Parker [facility] MRN: [id number] Date of Birth: August 22, 1950 Referring Provider (OT): Ellouise Newer, MD   Encounter Date: 01/03/2021   OT End of Session - 01/03/21 1624    Visit Number 3    Number of Visits 9    Date for OT Re-Evaluation 02/01/21    Authorization Type [employer]    Authorization Time Period $20 copay each - PT/OT Auth Req'd    OT Start Time 1623    OT Stop Time 1703    OT Time Calculation (min) 40 min    Activity Tolerance Patient tolerated treatment well    Behavior During Therapy [facility] for tasks assessed/performed           Past Medical History:  Diagnosis Date  . Allergy   . Arthritis    not dx'd  . Asthma    mild   . Cholecystitis   . 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 Karle Starch in [facility] hospital    . Depression   . GERD (gastroesophageal reflux disease)    controlled with nexium   . Hypertension     Past Surgical History:  Procedure Laterality Date  . APPENDECTOMY    . CARPAL TUNNEL RELEASE     left   . CHOLECYSTECTOMY N/A 04/20/2019   Procedure: LAPAROSCOPIC CHOLECYSTECTOMY;  Surgeon: Greer Pickerel, MD;  Location: [facility];  Service: General;  Laterality: N/A;  . COLON SURGERY  2002   10 inches of colon taken out   . COLONOSCOPY    . ENDOSCOPIC RETROGRADE CHOLANGIOPANCREATOGRAPHY (ERCP) WITH PROPOFOL N/A 03/01/2019   Procedure: ENDOSCOPIC RETROGRADE CHOLANGIOPANCREATOGRAPHY (ERCP) WITH PROPOFOL;  Surgeon: Rush Landmark Telford Nab., MD;  Location: [facility];  Service: Gastroenterology;  Laterality: N/A;  . ERCP  03/01/2019  . HERNIA REPAIR     bilateral inguinal   . IR CHOLANGIOGRAM EXISTING TUBE  03/25/2019  . IR PERC CHOLECYSTOSTOMY  03/02/2019  . left heel reconstruction  01/2002   17 screws  and 2 plates   . POLYPECTOMY    . REMOVAL OF STONES  03/01/2019   Procedure: REMOVAL OF STONES;  Surgeon: Rush Landmark Telford Nab., MD;  Location: [facility];  Service: Gastroenterology;;  . [name]  03/01/2019   Procedure: [name];  Surgeon: [name], MD;  Location: [facility];  Service: Gastroenterology;;  . tooth extracted     with laughing gas   . UPPER GASTROINTESTINAL ENDOSCOPY      There were no vitals filed for this visit.   Subjective Assessment - 01/03/21 1625    Subjective  "blood pressure is better and i have been doing more"    Patient is accompanied by: Family member   spouse, [name]   Pertinent History PMH: CLL, HTN, EF 55-60%, Depression, Asthma, Cognitive deficits (memory), tremors    Limitations CLL - leukemia, cognitive deficits premorbid    Patient Stated Goals "wants to be back to where I once was before this happened but not sure if that's realistic" mentioned desire to return to driving    Currently in Pain? No/denies            Small Pegs with LUE with mod drops and and min difficulty with coordination. No difficulty with copying pattern.  Environmental Scanning with 14/16 accuracy = 88% on first pass. Pt required mod cues  for locating remaining items on second pass.  Hand Gripper: with LUE on level 3 with silver spring. Pt picked up 1 inch blocks with gripper with mod drops and mod difficulty.  Constant Therapy Alternating Symbols level 6 with 92% accuracy and 67.42s response time. Pt with most errors d/t not understanding the instructions at the beginning of the task.                      OT Short Term Goals - 01/03/21 1641      OT SHORT TERM GOAL #1   Title Pt will be independent with HEP for grip strength and coordination    Time 3    Period Weeks    Status On-going    Target Date 01/11/21      OT SHORT TERM GOAL #2   Title Pt will complete environmental scanning with 90% accuracy or greater for  increase in overall attention.    Time 3    Period Weeks    Status On-going   88% accuracy on 01/03/21     OT SHORT TERM GOAL #3   Title Pt will increase grip strength in LUE by 5 lbs in order to increase overall functional use of LUE.    Baseline L 38.1, R 63.4    Time 3    Period Weeks    Status New      OT SHORT TERM GOAL #4   Title Pt will improve 9 hole peg test score by 3 seconds in LUE for increase in fine motor coordination    Baseline L 40.16s, R 26.32s    Time 3    Period Weeks    Status New      OT SHORT TERM GOAL #5   Title Pt will verbalize understanding of sensory strategies for safety with ADLs and IADLs.    Time 3    Period Weeks    Status New             OT Long Term Goals - 12/21/20 1724      OT LONG TERM GOAL #1   Title Pt will be independent with proximal strength HEP for BUE    Time 6    Period Weeks    Status New    Target Date 02/01/21      OT LONG TERM GOAL #2   Title Pt will improve fine motor coordination in LUE by completing 9 hole peg test in 35 seconds or less.    Baseline R 40.16s, L 26.32s    Time 6    Period Weeks    Status New      OT LONG TERM GOAL #3   Title Pt will improve grip strength in LUE by 10 lbs in order to increase functional use of LUE.    Baseline R 63.4, L 38.1    Time 6    Period Weeks    Status New      OT LONG TERM GOAL #4   Title Pt will complete physical and cognitive task simultaneously with 90% accuracy or greater for increasing dual task ability.    Time 6    Period Weeks    Status New      OT LONG TERM GOAL #5   Title Pt and spouse will verbalize understanding of return to driving recommendations    Time 6    Period Weeks    Status New  Plan - 01/03/21 1649    Clinical Impression Statement Pt progressing towards goals. Pt with good attention with environmental scanning and min difficulty with LUE this day.    OT Occupational Profile and History Detailed Assessment- Review  of Records and additional review of physical, cognitive, psychosocial history related to current functional performance    Occupational performance deficits (Please refer to evaluation for details): IADL's;ADL's    Body Structure / Function / Physical Skills ADL;FMC;Strength;UE functional use;Sensation;Coordination;Proprioception;GMC;Dexterity;IADL;Endurance;Decreased knowledge of use of DME    Cognitive Skills Attention;Problem Solve;Memory;Sequencing    Rehab Potential Good    Clinical Decision Making Limited treatment options, no task modification necessary    Comorbidities Affecting Occupational Performance: None    Modification or Assistance to Complete Evaluation  No modification of tasks or assist necessary to complete eval    OT Frequency 2x / week   2x/week for 2 weeks and 1x/week for 4 weeks   OT Duration 6 weeks   6 weeks total   OT Treatment/Interventions Self-care/ADL training;Fluidtherapy;DME and/or AE instruction;Therapeutic exercise;Cognitive remediation/compensation;Therapeutic activities;Passive range of motion;Patient/family education;Manual Therapy;Energy conservation;Neuromuscular education;Functional Mobility Training;Scar mobilization    Plan environmental scanning w cog, HEP for strengthening (low level theraband?)    Consulted and Agree with Plan of Care Patient;Family member/caregiver    Family Member Consulted spouse, [name]           Patient will benefit from skilled therapeutic intervention in order to improve the following deficits and impairments:   Body Structure / Function / Physical Skills: ADL,FMC,Strength,UE functional use,Sensation,Coordination,Proprioception,GMC,Dexterity,IADL,Endurance,Decreased knowledge of use of DME Cognitive Skills: Attention,Problem Solve,Memory,Sequencing     Visit Diagnosis: Muscle weakness (generalized)  Unsteadiness on feet  Other lack of coordination  Attention and concentration deficit  Frontal lobe and executive  function deficit  Other disturbances of skin sensation    Problem List Patient Active Problem List   Diagnosis Date Noted  . Acute ischemic VBA thalamic stroke, right ([place]) 11/24/2020  . Mild intermittent asthma 01/28/2020  . Dyspnea 05/11/2019  . Thrombocytosis 05/11/2019  . Alcohol dependence ([place]) 05/11/2019  . Abnormal findings on diagnostic imaging of gall bladder 05/10/2019  . Essential hypertension 04/22/2019  . Post-operative complication 17/61/6073  . Acute respiratory failure with hypoxia ([place]) 04/21/2019  . SIRS (systemic inflammatory response syndrome) ([place]) 04/21/2019  . Status post laparoscopic cholecystectomy 04/21/2019  . HCAP (healthcare-associated pneumonia) 04/21/2019  . CLL (chronic lymphocytic leukemia) ([place]) 04/21/2019  . Calculous cholecystitis 02/28/2019  . Campylobacter diarrhea   . Diarrhea of infectious origin   . STEC (Shiga toxin-producing Escherichia coli)   . Elevated LFTs   . Hypokalemia   . Gastroenteritis 01/19/2017  . Sepsis ([place]) 01/19/2017  . GERD (gastroesophageal reflux disease) 01/19/2017  . Depression 01/19/2017    Zachery Conch MOT, OTR/L  01/03/2021, 5:05 PM  [facility] [address] [place], [place], [address] Phone: [phone]   Fax:  [phone]  Name: Gurley Climer [facility] MRN: [id number] Date of Birth: 12/24/1950

## 2021-01-05 ENCOUNTER — Encounter: Payer: Self-pay | Admitting: Occupational Therapy

## 2021-01-05 ENCOUNTER — Other Ambulatory Visit: Payer: Self-pay

## 2021-01-05 ENCOUNTER — Ambulatory Visit: Payer: Medicare PPO

## 2021-01-05 ENCOUNTER — Ambulatory Visit: Payer: Medicare PPO | Admitting: Occupational Therapy

## 2021-01-05 VITALS — BP 148/78

## 2021-01-05 DIAGNOSIS — R278 Other lack of coordination: Secondary | ICD-10-CM

## 2021-01-05 DIAGNOSIS — M6281 Muscle weakness (generalized): Secondary | ICD-10-CM

## 2021-01-05 DIAGNOSIS — R2681 Unsteadiness on feet: Secondary | ICD-10-CM | POA: Diagnosis not present

## 2021-01-05 DIAGNOSIS — R2689 Other abnormalities of gait and mobility: Secondary | ICD-10-CM

## 2021-01-05 DIAGNOSIS — R41844 Frontal lobe and executive function deficit: Secondary | ICD-10-CM | POA: Diagnosis not present

## 2021-01-05 DIAGNOSIS — R208 Other disturbances of skin sensation: Secondary | ICD-10-CM | POA: Diagnosis not present

## 2021-01-05 DIAGNOSIS — R41840 Attention and concentration deficit: Secondary | ICD-10-CM

## 2021-01-05 NOTE — Therapy (Signed)
[facility] [address] [place], [place], [address] Phone: [phone]   Fax:  [phone]  Physical Therapy Treatment  Patient Details  Name: [facility] MRN: [id number] Date of Birth: Mar 07, 1951 Referring Provider (PT): Ellouise Newer   Encounter Date: 01/05/2021   PT End of Session - 01/05/21 1406    Visit Number 3    Number of Visits 9    Date for PT Re-Evaluation 02/15/21    Authorization Type human[employer] medicare    PT Start Time 1405    PT Stop Time 1447    PT Time Calculation (min) 42 min    Activity Tolerance Patient tolerated treatment well    Behavior During Therapy [facility] for tasks assessed/performed           Past Medical History:  Diagnosis Date  . Allergy   . Arthritis    not dx'd  . Asthma    mild   . Cholecystitis   . 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 Karle Starch in [facility] hospital    . Depression   . GERD (gastroesophageal reflux disease)    controlled with nexium   . Hypertension     Past Surgical History:  Procedure Laterality Date  . APPENDECTOMY    . CARPAL TUNNEL RELEASE     left   . CHOLECYSTECTOMY N/A 04/20/2019   Procedure: LAPAROSCOPIC CHOLECYSTECTOMY;  Surgeon: Greer Pickerel, MD;  [facility];  Service: General;  Laterality: N/A;  . COLON SURGERY  2002   10 inches of colon taken out   . COLONOSCOPY    . ENDOSCOPIC RETROGRADE CHOLANGIOPANCREATOGRAPHY (ERCP) WITH PROPOFOL N/A 03/01/2019   Procedure: ENDOSCOPIC RETROGRADE CHOLANGIOPANCREATOGRAPHY (ERCP) WITH PROPOFOL;  Surgeon: Rush Landmark Telford Nab., MD;  Location: [facility];  Service: Gastroenterology;  Laterality: N/A;  . ERCP  03/01/2019  . HERNIA REPAIR     bilateral inguinal   . IR CHOLANGIOGRAM EXISTING TUBE  03/25/2019  . IR PERC CHOLECYSTOSTOMY  03/02/2019  . left heel reconstruction  01/2002   17 screws and 2 plates   . POLYPECTOMY    . REMOVAL OF STONES  03/01/2019    Procedure: REMOVAL OF STONES;  Surgeon: Rush Landmark Telford Nab., MD;  Location: [facility];  Service: Gastroenterology;;  . [name]  03/01/2019   Procedure: [name];  Surgeon: Jonathon Parker., MD;  Location: [facility];  Service: Gastroenterology;;  . tooth extracted     with laughing gas   . UPPER GASTROINTESTINAL ENDOSCOPY      Vitals:   01/05/21 1410  BP: (!) 148/78     Subjective Assessment - 01/05/21 1407    Subjective Pt reports no changes. Has been doing the exercises but the tandem is challenging.    Patient is accompained by: Family member   wife   Pertinent History CLL,  HTN, GERD, Depression, mild cognitive impairment.    Patient Stated Goals Pt would like to return to prior level of function with blood pressure behaving as well.    Currently in Pain? No/denies                             [facility] Adult PT Treatment/Exercise - 01/05/21 1407      Ambulation/Gait   Ambulation/Gait Yes    Ambulation/Gait Assistance 5: Supervision    Ambulation/Gait Assistance Details Pt ambulated up/down grassy hill    Ambulation Distance (Feet) 1500 Feet  Assistive device None    Ambulation Surface Level;Unlevel;Outdoor;Paved;Grass;Other (comment)   mulch   Curb 5: Supervision    Curb Details (indicate cue type and reason) up/down multiple curbs outside from paved to grass and mulch surfaces.      Neuro Re-ed    Neuro Re-ed Details  In // bars: tandem gait 8'x 6 with slight touch of back of hand on 1 bars. On blue mat: tandem stance 30 sec each position then with adding in head turns left/right x 10. Pt more challenged with LLE posterior. In // bars on rockerboard positioned ant/post: Trying to maintain board level without UE support x 30 sec eyes open, repeated with head turns left/right x 10, rocking board ant/post x 10 CGA, step-ups on rockerboard without UE support with LLE x 10 CGA.   Dynamic gait activities in hallway: 40' x 2 with head turns  left/right and then 40' x 2 with head turns up/down, gait with eyes closed 40' x 2. Pt initially veering to left but able to correct on return after cuing, marching gait 40' x 2 with verbal cues to step softly. Pt more challenged with left SLS. Reported no increase pain in left ankle compared to his normal at end. Did advise him to elevate leg and ice ankle as needed to help with swelling. Also suggested not rolling socks down to cause circumferential pressure around ankle. BP=142/78 at end                  PT Education - 01/05/21 1503    Education Details Discussed changing current schedule to drop down to the 1x/week frequency that was planned. Pt to continue with current HEP    Person(s) Educated Patient;Spouse    Methods Explanation    Comprehension Verbalized understanding            PT Short Term Goals - 12/21/20 1845      PT SHORT TERM GOAL #1   Title Pt will decrease 5 x sit to stand from 17.83 sec to <15 sec for improved balance and functional strength.    Baseline 12/21/20 17.83 sec from chair without hands    Time 3    Period Weeks    Status New    Target Date 01/11/21      PT SHORT TERM GOAL #2   Title Pt will ambulate >500' on varied surfaces supervision for improved community mobility.    Time 3    Period Weeks    Status New    Target Date 01/11/21      PT SHORT TERM GOAL #3   Title Pt will ambulate up/down 8 steps independently in reciprocal pattern with rails for improved community access.    Time 3    Period Weeks    Status New    Target Date 01/11/21             PT Long Term Goals - 12/21/20 1847      PT LONG TERM GOAL #1   Title Pt will be independent with HEP for balance and strengthening to continue gains on own.    Time 6    Period Weeks    Status New    Target Date 02/01/21      PT LONG TERM GOAL #2   Title Pt will increase gait speed to >1.22m/s for improved gait safety in community.    Baseline 12/21/20 1.32m/s    Time 6    Period  Weeks    Status  New    Target Date 01/11/21      PT LONG TERM GOAL #3   Title Pt will increas FGA to >24/30 for improved gait safety.    Baseline 12/21/20 21/30    Time 6    Period Weeks    Status New    Target Date 02/01/21      PT LONG TERM GOAL #4   Title Pt will ambulate >1000' on varied surfaces independently for improved community mobility.    Time 6    Period Weeks    Status New    Target Date 02/01/21      PT LONG TERM GOAL #5   Title Pt will incresae FOTO to 77% for decreased self perceived disability.    Baseline 12/21/20 72%    Time 6    Period Weeks    Status New    Target Date 02/01/21                 Plan - 01/05/21 1504    Clinical Impression Statement Pt did well with gait on varied surfaces showing no instabilty. PT continued to focus on activities to challenge vestibular system more which he felt were challenging but he had better stability today.    Personal Factors and Comorbidities Comorbidity 3+    Comorbidities HTN, GERD, Depression, mild cognitive impairment, CLL    Examination-Activity Limitations Stairs;Locomotion Level    Examination-Participation Restrictions Community Activity;Yard Work;Driving    Stability/Clinical Decision Making Evolving/Moderate complexity    Rehab Potential Good    PT Frequency 2x / week   followed by 1x/week for 4 weeks. Plus initial eval.   PT Duration 2 weeks    PT Treatment/Interventions Neuromuscular re-education;Balance training;Therapeutic exercise;Therapeutic activities;Functional mobility training;Gait training;ADLs/Self Care Home Management;Patient/family education;Manual techniques;Passive range of motion;Vestibular    PT Next Visit Plan Monitor BP. continue high level balance training (tandem gait, gait with horizontal head turns, eyes closed activities, compliant surfaces) left hamstring strengthening, gait on varied surfaces.    PT Home Exercise Plan [place] and Agree with Plan of Care  Patient;Family member/caregiver    Family Member Consulted wife           Patient will benefit from skilled therapeutic intervention in order to improve the following deficits and impairments:  Abnormal gait,Decreased balance,Decreased activity tolerance,Decreased mobility,Decreased strength,Impaired sensation  Visit Diagnosis: Other abnormalities of gait and mobility  Unsteadiness on feet     Problem List Patient Active Problem List   Diagnosis Date Noted  . Acute ischemic VBA thalamic stroke, right ([place]) 11/24/2020  . Mild intermittent asthma 01/28/2020  . Dyspnea 05/11/2019  . Thrombocytosis 05/11/2019  . Alcohol dependence ([place]) 05/11/2019  . Abnormal findings on diagnostic imaging of gall bladder 05/10/2019  . Essential hypertension 04/22/2019  . Post-operative complication 59/56/3875  . Acute respiratory failure with hypoxia ([place]) 04/21/2019  . SIRS (systemic inflammatory response syndrome) ([place]) 04/21/2019  . Status post laparoscopic cholecystectomy 04/21/2019  . HCAP (healthcare-associated pneumonia) 04/21/2019  . CLL (chronic lymphocytic leukemia) ([place]) 04/21/2019  . Calculous cholecystitis 02/28/2019  . Campylobacter diarrhea   . Diarrhea of infectious origin   . STEC (Shiga toxin-producing Escherichia coli)   . Elevated LFTs   . Hypokalemia   . Gastroenteritis 01/19/2017  . Sepsis ([place]) 01/19/2017  . GERD (gastroesophageal reflux disease) 01/19/2017  . Depression 01/19/2017    Electa Sniff, PT, DPT, NCS 01/05/2021, 3:08 PM  [facility] [address]  [place], [place], [address] Phone: [phone]   Fax:  [phone]  Name: Makai Dumond [facility] MRN: [id number] Date of Birth: 1951/01/09

## 2021-01-05 NOTE — Therapy (Signed)
[facility] [address] [place], [place], [address] Phone: [phone]   Fax:  [phone]  Occupational Therapy Treatment  Patient Details  Name: Jonathon Parker [facility] MRN: [id number] Date of Birth: 11/16/1950 Referring Provider (OT): Ellouise Newer, MD   Encounter Date: 01/05/2021   OT End of Session - 01/05/21 1452    Visit Number 4    Number of Visits 9    Date for OT Re-Evaluation 02/01/21    Authorization Type [employer]    Authorization Time Period $20 copay each - PT/OT Auth Req'd    OT Start Time 1450    OT Stop Time 1530    OT Time Calculation (min) 40 min    Activity Tolerance Patient tolerated treatment well    Behavior During Therapy [facility] for tasks assessed/performed           Past Medical History:  Diagnosis Date  . Allergy   . Arthritis    not dx'd  . Asthma    mild   . Cholecystitis   . 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 Karle Starch in [facility] hospital    . Depression   . GERD (gastroesophageal reflux disease)    controlled with nexium   . Hypertension     Past Surgical History:  Procedure Laterality Date  . APPENDECTOMY    . CARPAL TUNNEL RELEASE     left   . CHOLECYSTECTOMY N/A 04/20/2019   Procedure: LAPAROSCOPIC CHOLECYSTECTOMY;  Surgeon: Greer Pickerel, MD;  Location: [facility];  Service: General;  Laterality: N/A;  . COLON SURGERY  2002   10 inches of colon taken out   . COLONOSCOPY    . ENDOSCOPIC RETROGRADE CHOLANGIOPANCREATOGRAPHY (ERCP) WITH PROPOFOL N/A 03/01/2019   Procedure: ENDOSCOPIC RETROGRADE CHOLANGIOPANCREATOGRAPHY (ERCP) WITH PROPOFOL;  Surgeon: Rush Landmark Telford Nab., MD;  Location: [facility];  Service: Gastroenterology;  Laterality: N/A;  . ERCP  03/01/2019  . HERNIA REPAIR     bilateral inguinal   . IR CHOLANGIOGRAM EXISTING TUBE  03/25/2019  . IR PERC CHOLECYSTOSTOMY  03/02/2019  . left heel reconstruction  01/2002   17 screws  and 2 plates   . POLYPECTOMY    . REMOVAL OF STONES  03/01/2019   Procedure: REMOVAL OF STONES;  Surgeon: Rush Landmark Telford Nab., MD;  Location: [facility];  Service: Gastroenterology;;  . [name]  03/01/2019   Procedure: [name];  Surgeon: [name], MD;  Location: [facility];  Service: Gastroenterology;;  . tooth extracted     with laughing gas   . UPPER GASTROINTESTINAL ENDOSCOPY      There were no vitals filed for this visit.   Subjective Assessment - 01/05/21 1452    Subjective  Pt reports some swelling in his LLE.    Patient is accompanied by: Family member   spouse, [name]   Pertinent History PMH: CLL, HTN, EF 55-60%, Depression, Asthma, Cognitive deficits (memory), tremors    Limitations CLL - leukemia, cognitive deficits premorbid    Patient Stated Goals "wants to be back to where I once was before this happened but not sure if that's realistic" mentioned desire to return to driving    Currently in Pain? Yes   swelling and pressure in LLE foot           O'Connor Pegs placed with LUE with mod difficulty and mod drops. Removed with tweezers.   Hand Gripper: with LUE on level 3 with silver spring. Pt picked up  1 inch blocks with gripper with min drops and mod difficulty. Downgraded to level 2 approx half way through.   Access Code: [email or web address] URL: [email or web address] Date: 01/05/2021 Prepared by: Waldo Laine  Exercises Standing Elbow Extension with Self-Anchored Resistance - 1 x daily - 7 x weekly - 3 sets - 10 reps Standing Elbow Flexion with Self-Anchored Resistance - 1 x daily - 7 x weekly - 3 sets - 10 reps Standing Shoulder Horizontal Abduction with Resistance - 1 x daily - 7 x weekly - 3 sets - 10 reps Standing Shoulder Flexion with Resistance - 1 x daily - 7 x weekly - 3 sets - 10 reps Standing Shoulder Diagonal Horizontal Abduction 60/120 Degrees with Resistance - 1 x daily - 7 x weekly - 3 sets - 10  reps Standing Single Arm Shoulder Abduction with Resistance - 1 x daily - 7 x weekly - 3 sets - 10 reps Single Arm Punch with Resistance - 1 x daily - 7 x weekly - 3 sets - 10 reps                     OT Short Term Goals - 01/03/21 1641      OT SHORT TERM GOAL #1   Title Pt will be independent with HEP for grip strength and coordination    Time 3    Period Weeks    Status On-going    Target Date 01/11/21      OT SHORT TERM GOAL #2   Title Pt will complete environmental scanning with 90% accuracy or greater for increase in overall attention.    Time 3    Period Weeks    Status On-going   88% accuracy on 01/03/21     OT SHORT TERM GOAL #3   Title Pt will increase grip strength in LUE by 5 lbs in order to increase overall functional use of LUE.    Baseline L 38.1, R 63.4    Time 3    Period Weeks    Status New      OT SHORT TERM GOAL #4   Title Pt will improve 9 hole peg test score by 3 seconds in LUE for increase in fine motor coordination    Baseline L 40.16s, R 26.32s    Time 3    Period Weeks    Status New      OT SHORT TERM GOAL #5   Title Pt will verbalize understanding of sensory strategies for safety with ADLs and IADLs.    Time 3    Period Weeks    Status New             OT Long Term Goals - 12/21/20 1724      OT LONG TERM GOAL #1   Title Pt will be independent with proximal strength HEP for BUE    Time 6    Period Weeks    Status New    Target Date 02/01/21      OT LONG TERM GOAL #2   Title Pt will improve fine motor coordination in LUE by completing 9 hole peg test in 35 seconds or less.    Baseline R 40.16s, L 26.32s    Time 6    Period Weeks    Status New      OT LONG TERM GOAL #3   Title Pt will improve grip strength in LUE by 10 lbs in order to increase functional use of LUE.  Baseline R 63.4, L 38.1    Time 6    Period Weeks    Status New      OT LONG TERM GOAL #4   Title Pt will complete physical and cognitive task  simultaneously with 90% accuracy or greater for increasing dual task ability.    Time 6    Period Weeks    Status New      OT LONG TERM GOAL #5   Title Pt and spouse will verbalize understanding of return to driving recommendations    Time 6    Period Weeks    Status New                  Patient will benefit from skilled therapeutic intervention in order to improve the following deficits and impairments:           Visit Diagnosis: Muscle weakness (generalized)  Unsteadiness on feet  Other lack of coordination  Frontal lobe and executive function deficit  Attention and concentration deficit    Problem List Patient Active Problem List   Diagnosis Date Noted  . Acute ischemic VBA thalamic stroke, right ([place]) 11/24/2020  . Mild intermittent asthma 01/28/2020  . Dyspnea 05/11/2019  . Thrombocytosis 05/11/2019  . Alcohol dependence ([place]) 05/11/2019  . Abnormal findings on diagnostic imaging of gall bladder 05/10/2019  . Essential hypertension 04/22/2019  . Post-operative complication 78/93/8101  . Acute respiratory failure with hypoxia ([place]) 04/21/2019  . SIRS (systemic inflammatory response syndrome) ([place]) 04/21/2019  . Status post laparoscopic cholecystectomy 04/21/2019  . HCAP (healthcare-associated pneumonia) 04/21/2019  . CLL (chronic lymphocytic leukemia) ([place]) 04/21/2019  . Calculous cholecystitis 02/28/2019  . Campylobacter diarrhea   . Diarrhea of infectious origin   . STEC (Shiga toxin-producing Escherichia coli)   . Elevated LFTs   . Hypokalemia   . Gastroenteritis 01/19/2017  . Sepsis ([place]) 01/19/2017  . GERD (gastroesophageal reflux disease) 01/19/2017  . Depression 01/19/2017    Zachery Conch MOT, OTR/L  01/05/2021, 3:21 PM  [facility] [address] [place], [place], [address] Phone: [phone]   Fax:  [phone]  Name: Mcclellan Demarais [facility] MRN: [id number] Date  of Birth: [birth year]

## 2021-01-05 NOTE — Patient Instructions (Signed)
Access Code: [email or web address] URL: [email or web address] Date: 01/05/2021 Prepared by: Waldo Laine  Exercises Standing Elbow Extension with Self-Anchored Resistance - 1 x daily - 7 x weekly - 3 sets - 10 reps Standing Elbow Flexion with Self-Anchored Resistance - 1 x daily - 7 x weekly - 3 sets - 10 reps Standing Shoulder Horizontal Abduction with Resistance - 1 x daily - 7 x weekly - 3 sets - 10 reps Standing Shoulder Flexion with Resistance - 1 x daily - 7 x weekly - 3 sets - 10 reps Standing Shoulder Diagonal Horizontal Abduction 60/120 Degrees with Resistance - 1 x daily - 7 x weekly - 3 sets - 10 reps Standing Single Arm Shoulder Abduction with Resistance - 1 x daily - 7 x weekly - 3 sets - 10 reps Single Arm Punch with Resistance - 1 x daily - 7 x weekly - 3 sets - 10 reps

## 2021-01-09 ENCOUNTER — Other Ambulatory Visit: Payer: Self-pay

## 2021-01-09 ENCOUNTER — Ambulatory Visit: Payer: Medicare PPO

## 2021-01-09 ENCOUNTER — Ambulatory Visit: Payer: Medicare PPO | Admitting: Occupational Therapy

## 2021-01-09 ENCOUNTER — Encounter: Payer: Self-pay | Admitting: Occupational Therapy

## 2021-01-09 DIAGNOSIS — R278 Other lack of coordination: Secondary | ICD-10-CM | POA: Diagnosis not present

## 2021-01-09 DIAGNOSIS — R2681 Unsteadiness on feet: Secondary | ICD-10-CM

## 2021-01-09 DIAGNOSIS — R2689 Other abnormalities of gait and mobility: Secondary | ICD-10-CM

## 2021-01-09 DIAGNOSIS — R41840 Attention and concentration deficit: Secondary | ICD-10-CM

## 2021-01-09 DIAGNOSIS — M6281 Muscle weakness (generalized): Secondary | ICD-10-CM | POA: Diagnosis not present

## 2021-01-09 DIAGNOSIS — R208 Other disturbances of skin sensation: Secondary | ICD-10-CM | POA: Diagnosis not present

## 2021-01-09 DIAGNOSIS — R41844 Frontal lobe and executive function deficit: Secondary | ICD-10-CM | POA: Diagnosis not present

## 2021-01-09 NOTE — Therapy (Signed)
[facility] [address] [place], [place], [address] Phone: [phone]   Fax:  [phone]  Physical Therapy Treatment  Patient Details  Name: Jonathon Parker [facility] MRN: [id number] Date of Birth: 12-Dec-1950 Referring Provider (PT): Ellouise Newer   Encounter Date: 01/09/2021   PT End of Session - 01/09/21 1404    Visit Number 4    Number of Visits 9    Date for PT Re-Evaluation 02/15/21    Authorization Type [employer] medicare    PT Start Time 1402    PT Stop Time 1440    PT Time Calculation (min) 38 min    Equipment Utilized During Treatment Gait belt    Activity Tolerance Patient tolerated treatment well    Behavior During Therapy [facility] for tasks assessed/performed           Past Medical History:  Diagnosis Date  . Allergy   . Arthritis    not dx'd  . Asthma    mild   . Cholecystitis   . 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 Karle Starch in [facility] hospital    . Depression   . GERD (gastroesophageal reflux disease)    controlled with nexium   . Hypertension     Past Surgical History:  Procedure Laterality Date  . APPENDECTOMY    . CARPAL TUNNEL RELEASE     left   . CHOLECYSTECTOMY N/A 04/20/2019   Procedure: LAPAROSCOPIC CHOLECYSTECTOMY;  Surgeon: Greer Pickerel, MD;  [facility];  Service: General;  Laterality: N/A;  . COLON SURGERY  2002   10 inches of colon taken out   . COLONOSCOPY    . ENDOSCOPIC RETROGRADE CHOLANGIOPANCREATOGRAPHY (ERCP) WITH PROPOFOL N/A 03/01/2019   Procedure: ENDOSCOPIC RETROGRADE CHOLANGIOPANCREATOGRAPHY (ERCP) WITH PROPOFOL;  Surgeon: Rush Landmark Telford Nab., MD;  Location: [facility];  Service: Gastroenterology;  Laterality: N/A;  . ERCP  03/01/2019  . HERNIA REPAIR     bilateral inguinal   . IR CHOLANGIOGRAM EXISTING TUBE  03/25/2019  . IR PERC CHOLECYSTOSTOMY  03/02/2019  . left heel reconstruction  01/2002   17 screws and 2 plates   .  POLYPECTOMY    . REMOVAL OF STONES  03/01/2019   Procedure: REMOVAL OF STONES;  Surgeon: Rush Landmark Telford Nab., MD;  Location: [facility];  Service: Gastroenterology;;  . [name]  03/01/2019   Procedure: [name];  Surgeon: [name], MD;  Location: [facility];  Service: Gastroenterology;;  . tooth extracted     with laughing gas   . UPPER GASTROINTESTINAL ENDOSCOPY      There were no vitals filed for this visit.   Subjective Assessment - 01/09/21 1405    Subjective Pt reports that he has not been doing the exercises like he should. Has been doing a lot of yard work activities. Still having to break up activities as gets winded but that has been for some time.    Patient is accompained by: Family member   wife   Pertinent History CLL,  HTN, GERD, Depression, mild cognitive impairment.    Patient Stated Goals Pt would like to return to prior level of function with blood pressure behaving as well.    Currently in Pain? No/denies                             [facility] Adult PT Treatment/Exercise - 01/09/21 1406      Transfers   Five  time sit to stand comments  14.16 sec from chair without hands      Ambulation/Gait   Ambulation/Gait Yes    Ambulation/Gait Assistance 7: Independent    Ambulation Distance (Feet) 850 Feet    Assistive device None    Gait Pattern Step-through pattern    Ambulation Surface Level;Unlevel;Indoor;Outdoor;Paved;Grass    Stairs Yes    Stairs Assistance 6: Modified independent (Device/Increase time)    Stair Management Technique One rail Right;Alternating pattern    Number of Stairs 12    Height of Stairs 6      Neuro Re-ed    Neuro Re-ed Details  Gait over obstacles: reciprocal stepping over 6 varied height orange hurdles with airex in middle one x 6 bouts then performed with side stepping with having a cone to tap every other hurdle as well x 4 bouts. Standing on airex at counter: tandem stance x 30 sec eyes  open then 30 sec eyes closed each position. Needed CGA/min assist at times with eyes closed. Feet together with eyes closed x 30 sec, head turns left/right x 10. Step-ups on airex x 5 each foot with tapping cone in front to increase SLS time. Sit to stand x 5 from mat standing on soft blue foam beam then x 5 with holding 2.2# med ball in front and raising overhead with cues to control movement.                  PT Education - 01/09/21 1520    Education Details PT discussed results of testing.    Person(s) Educated Patient;Spouse    Methods Explanation    Comprehension Verbalized understanding            PT Short Term Goals - 01/09/21 1407      PT SHORT TERM GOAL #1   Title Pt will decrease 5 x sit to stand from 17.83 sec to <15 sec for improved balance and functional strength.    Baseline 12/21/20 17.83 sec from chair without hands. 01/09/21 14.16 sec from chair without hands.    Time 3    Period Weeks    Status Achieved    Target Date 01/11/21      PT SHORT TERM GOAL #2   Title Pt will ambulate >500' on varied surfaces supervision for improved community mobility.    Baseline 01/09/21 Pt ambulated 850' independently on varied surfaces.    Time 3    Period Weeks    Status Achieved    Target Date 01/11/21      PT SHORT TERM GOAL #3   Title Pt will ambulate up/down 8 steps independently in reciprocal pattern with rails for improved community access.    Baseline 01/09/21 12 steps with reciprocal pattern with 1 rail mod I    Time 3    Period Weeks    Status Achieved    Target Date 01/11/21             PT Long Term Goals - 12/21/20 1847      PT LONG TERM GOAL #1   Title Pt will be independent with HEP for balance and strengthening to continue gains on own.    Time 6    Period Weeks    Status New    Target Date 02/01/21      PT LONG TERM GOAL #2   Title Pt will increase gait speed to >1.15ms for improved gait safety in community.    Baseline 12/21/20 1.012m  Time  6    Period Weeks    Status New    Target Date 01/11/21      PT LONG TERM GOAL #3   Title Pt will increas FGA to >24/30 for improved gait safety.    Baseline 12/21/20 21/30    Time 6    Period Weeks    Status New    Target Date 02/01/21      PT LONG TERM GOAL #4   Title Pt will ambulate >1000' on varied surfaces independently for improved community mobility.    Time 6    Period Weeks    Status New    Target Date 02/01/21      PT LONG TERM GOAL #5   Title Pt will incresae FOTO to 77% for decreased self perceived disability.    Baseline 12/21/20 72%    Time 6    Period Weeks    Status New    Target Date 02/01/21                 Plan - 01/09/21 1520    Clinical Impression Statement Pt has met all STGs. He is showing good stability with gait on varied surfaces. Met 5 x sit to stand goal showing improving balance. He was most challenged with increasing SLS time and eyes closed on compliant surfaces. Pt continues to benefit from skilled PT to continue to progress high level balance.    Personal Factors and Comorbidities Comorbidity 3+    Comorbidities HTN, GERD, Depression, mild cognitive impairment, CLL    Examination-Activity Limitations Stairs;Locomotion Level    Examination-Participation Restrictions Community Activity;Yard Work;Driving    Stability/Clinical Decision Making Evolving/Moderate complexity    Rehab Potential Good    PT Frequency 2x / week   followed by 1x/week for 4 weeks. Plus initial eval.   PT Duration 2 weeks    PT Treatment/Interventions Neuromuscular re-education;Balance training;Therapeutic exercise;Therapeutic activities;Functional mobility training;Gait training;ADLs/Self Care Home Management;Patient/family education;Manual techniques;Passive range of motion;Vestibular    PT Next Visit Plan Monitor BP. Begin checking LTGs for possible early d/c is continues to do well. Getting close to PLOF per pt. continue high level balance training (tandem gait,  gait with horizontal head turns, eyes closed activities, compliant surfaces) left hamstring strengthening, gait on varied surfaces.    PT Home Exercise Plan [place] and Agree with Plan of Care Patient;Family member/caregiver    Family Member Consulted wife           Patient will benefit from skilled therapeutic intervention in order to improve the following deficits and impairments:  Abnormal gait,Decreased balance,Decreased activity tolerance,Decreased mobility,Decreased strength,Impaired sensation  Visit Diagnosis: Other abnormalities of gait and mobility  Unsteadiness on feet     Problem List Patient Active Problem List   Diagnosis Date Noted  . Acute ischemic VBA thalamic stroke, right ([place]) 11/24/2020  . Mild intermittent asthma 01/28/2020  . Dyspnea 05/11/2019  . Thrombocytosis 05/11/2019  . Alcohol dependence ([place]) 05/11/2019  . Abnormal findings on diagnostic imaging of gall bladder 05/10/2019  . Essential hypertension 04/22/2019  . Post-operative complication 30/04/2329  . Acute respiratory failure with hypoxia ([place]) 04/21/2019  . SIRS (systemic inflammatory response syndrome) ([place]) 04/21/2019  . Status post laparoscopic cholecystectomy 04/21/2019  . HCAP (healthcare-associated pneumonia) 04/21/2019  . CLL (chronic lymphocytic leukemia) ([place]) 04/21/2019  . Calculous cholecystitis 02/28/2019  . Campylobacter diarrhea   . Diarrhea of infectious origin   . STEC (Shiga toxin-producing Escherichia coli)   . Elevated  LFTs   . Hypokalemia   . Gastroenteritis 01/19/2017  . Sepsis ([place]) 01/19/2017  . GERD (gastroesophageal reflux disease) 01/19/2017  . Depression 01/19/2017    Electa Sniff, PT, DPT, NCS 01/09/2021, 3:22 PM  [facility] [address] [place], [place], [address] Phone: [phone]   Fax:  [phone]  Name: Jonathon Parker [facility] MRN: [id number] Date of Birth:  [birth year]

## 2021-01-09 NOTE — Therapy (Signed)
[facility] [address] [place], [place], [address] Phone: [phone]   Fax:  [phone]  Occupational Therapy Treatment  Patient Details  Name: Jonathon Parker [facility] MRN: [id number] Date of Birth: Sep 07, 1950 Referring Provider (OT): Ellouise Newer, MD   Encounter Date: 01/09/2021   OT End of Session - 01/09/21 1320    Visit Number 5    Number of Visits 9    Date for OT Re-Evaluation 02/01/21    Authorization Type [employer] Medicare    Authorization Time Period $20 copay each - PT/OT Auth Req'd    OT Start Time 1320    OT Stop Time 1400    OT Time Calculation (min) 40 min    Activity Tolerance Patient tolerated treatment well    Behavior During Therapy [facility] for tasks assessed/performed           Past Medical History:  Diagnosis Date  . Allergy   . Arthritis    not dx'd  . Asthma    mild   . Cholecystitis   . 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 Karle Starch in [facility] hospital    . Depression   . GERD (gastroesophageal reflux disease)    controlled with nexium   . Hypertension     Past Surgical History:  Procedure Laterality Date  . APPENDECTOMY    . CARPAL TUNNEL RELEASE     left   . CHOLECYSTECTOMY N/A 04/20/2019   Procedure: LAPAROSCOPIC CHOLECYSTECTOMY;  Surgeon: Greer Pickerel, MD;  [facility];  Service: General;  Laterality: N/A;  . COLON SURGERY  2002   10 inches of colon taken out   . COLONOSCOPY    . ENDOSCOPIC RETROGRADE CHOLANGIOPANCREATOGRAPHY (ERCP) WITH PROPOFOL N/A 03/01/2019   Procedure: ENDOSCOPIC RETROGRADE CHOLANGIOPANCREATOGRAPHY (ERCP) WITH PROPOFOL;  Surgeon: Rush Landmark Telford Nab., MD;  Location: [facility];  Service: Gastroenterology;  Laterality: N/A;  . ERCP  03/01/2019  . HERNIA REPAIR     bilateral inguinal   . IR CHOLANGIOGRAM EXISTING TUBE  03/25/2019  . IR PERC CHOLECYSTOSTOMY  03/02/2019  . left heel reconstruction  01/2002   17 screws  and 2 plates   . POLYPECTOMY    . REMOVAL OF STONES  03/01/2019   Procedure: REMOVAL OF STONES;  Surgeon: Rush Landmark Telford Nab., MD;  Location: [place];  Service: Gastroenterology;;  . [name]  03/01/2019   Procedure: [name];  Surgeon: [name], MD;  Location: [facility];  Service: Gastroenterology;;  . tooth extracted     with laughing gas   . UPPER GASTROINTESTINAL ENDOSCOPY      There were no vitals filed for this visit.   Subjective Assessment - 01/09/21 1321    Subjective  "no more than normal" - Pt reports they're going to [employer].    Patient is accompanied by: Family member   spouse, [name]   Pertinent History PMH: CLL, HTN, EF 55-60%, Depression, Asthma, Cognitive deficits (memory), tremors    Limitations CLL - leukemia, cognitive deficits premorbid    Patient Stated Goals "wants to be back to where I once was before this happened but not sure if that's realistic" mentioned desire to return to driving    Currently in Pain? No/denies             nuts and bolts with min difficulty with LUE coordination and decreased sensation with vision occluded.   Environmental Scanning with 16/16 accuracy = 100% with cognitive component for finding in  sequential order. Pt required increased time to find one number that was lower but found without cues.    Small Peg Board following pattern. Min difficulty for coordination with LUE and min drops. removed with in hand manipulation               OT Short Term Goals - 01/09/21 1324      OT SHORT TERM GOAL #1   Title Pt will be independent with HEP for grip strength and coordination    Time 3    Period Weeks    Status Achieved    Target Date 01/11/21      OT SHORT TERM GOAL #2   Title Pt will complete environmental scanning with 90% accuracy or greater for increase in overall attention.    Time 3    Period Weeks    Status Achieved   88% accuracy on 01/03/21     OT SHORT TERM  GOAL #3   Title Pt will increase grip strength in LUE by 5 lbs in order to increase overall functional use of LUE.    Baseline L 38.1, R 63.4    Time 3    Period Weeks    Status Achieved   68.6 LUE 01/09/21     OT SHORT TERM GOAL #4   Title Pt will improve 9 hole peg test score by 3 seconds in LUE for increase in fine motor coordination    Baseline L 40.16s, R 26.32s    Time 3    Period Weeks    Status Achieved   LUE 32 s 01/09/21     OT SHORT TERM GOAL #5   Title Pt will verbalize understanding of sensory strategies for safety with ADLs and IADLs.    Time 3    Period Weeks    Status Achieved             OT Long Term Goals - 01/09/21 1325      OT LONG TERM GOAL #1   Title Pt will be independent with proximal strength HEP for BUE    Time 6    Period Weeks    Status On-going      OT LONG TERM GOAL #2   Title Pt will improve fine motor coordination in LUE by completing 9 hole peg test in 35 seconds or less.    Baseline R 40.16s, L 26.32s    Time 6    Period Weeks    Status Achieved   LUE 32s 01/09/21     OT LONG TERM GOAL #3   Title Pt will improve grip strength in LUE by 10 lbs in order to increase functional use of LUE.    Baseline R 63.4, L 38.1    Time 6    Period Weeks    Status Achieved   68.6 lb with LUE 01/09/21     OT LONG TERM GOAL #4   Title Pt will complete physical and cognitive task simultaneously with 90% accuracy or greater for increasing dual task ability.    Time 6    Period Weeks    Status New      OT LONG TERM GOAL #5   Title Pt and spouse will verbalize understanding of return to driving recommendations    Time 6    Period Weeks    Status New                 Plan - 01/09/21 1342    Clinical Impression  Statement Pt progressing towards goals. Pt reports not being consistent with HEPs. PT has met all STGs    OT Occupational Profile and History Detailed Assessment- Review of Records and additional review of physical, cognitive, psychosocial  history related to current functional performance    Occupational performance deficits (Please refer to evaluation for details): IADL's;ADL's    Body Structure / Function / Physical Skills ADL;FMC;Strength;UE functional use;Sensation;Coordination;Proprioception;GMC;Dexterity;IADL;Endurance;Decreased knowledge of use of DME    Cognitive Skills Attention;Problem Solve;Memory;Sequencing    Rehab Potential Good    Clinical Decision Making Limited treatment options, no task modification necessary    Comorbidities Affecting Occupational Performance: None    Modification or Assistance to Complete Evaluation  No modification of tasks or assist necessary to complete eval    OT Frequency 2x / week   2x/week for 2 weeks and 1x/week for 4 weeks   OT Duration 6 weeks   6 weeks total   OT Treatment/Interventions Self-care/ADL training;Fluidtherapy;DME and/or AE instruction;Therapeutic exercise;Cognitive remediation/compensation;Therapeutic activities;Passive range of motion;Patient/family education;Manual Therapy;Energy conservation;Neuromuscular education;Functional Mobility Training;Scar mobilization    Plan plan to discharge next session    Consulted and Agree with Plan of Care Patient;Family member/caregiver    Family Member Consulted spouse, [name]           Patient will benefit from skilled therapeutic intervention in order to improve the following deficits and impairments:   Body Structure / Function / Physical Skills: ADL,FMC,Strength,UE functional use,Sensation,Coordination,Proprioception,GMC,Dexterity,IADL,Endurance,Decreased knowledge of use of DME Cognitive Skills: Attention,Problem Solve,Memory,Sequencing     Visit Diagnosis: Muscle weakness (generalized)  Unsteadiness on feet  Other lack of coordination  Frontal lobe and executive function deficit  Attention and concentration deficit  Other abnormalities of gait and mobility    Problem List Patient Active Problem List    Diagnosis Date Noted  . Acute ischemic VBA thalamic stroke, right ([place]) 11/24/2020  . Mild intermittent asthma 01/28/2020  . Dyspnea 05/11/2019  . Thrombocytosis 05/11/2019  . Alcohol dependence ([place]) 05/11/2019  . Abnormal findings on diagnostic imaging of gall bladder 05/10/2019  . Essential hypertension 04/22/2019  . Post-operative complication 48/18/5631  . Acute respiratory failure with hypoxia ([place]) 04/21/2019  . SIRS (systemic inflammatory response syndrome) ([place]) 04/21/2019  . Status post laparoscopic cholecystectomy 04/21/2019  . HCAP (healthcare-associated pneumonia) 04/21/2019  . CLL (chronic lymphocytic leukemia) ([place]) 04/21/2019  . Calculous cholecystitis 02/28/2019  . Campylobacter diarrhea   . Diarrhea of infectious origin   . STEC (Shiga toxin-producing Escherichia coli)   . Elevated LFTs   . Hypokalemia   . Gastroenteritis 01/19/2017  . Sepsis ([place]) 01/19/2017  . GERD (gastroesophageal reflux disease) 01/19/2017  . Depression 01/19/2017    Zachery Conch MOT, OTR/L  01/09/2021, 2:09 PM  [facility] [address] [place], [place], [address] Phone: [phone]   Fax:  [phone]  Name: Jonathon [facility] MRN: [id number] Date of Birth: [birth year]

## 2021-01-12 ENCOUNTER — Ambulatory Visit: Payer: Medicare PPO

## 2021-01-12 ENCOUNTER — Ambulatory Visit: Payer: Medicare PPO | Admitting: Occupational Therapy

## 2021-01-16 ENCOUNTER — Ambulatory Visit: Payer: Medicare PPO | Admitting: Occupational Therapy

## 2021-01-16 ENCOUNTER — Ambulatory Visit: Payer: Medicare PPO

## 2021-01-16 ENCOUNTER — Encounter: Payer: Self-pay | Admitting: Occupational Therapy

## 2021-01-16 ENCOUNTER — Other Ambulatory Visit: Payer: Self-pay

## 2021-01-16 DIAGNOSIS — R2681 Unsteadiness on feet: Secondary | ICD-10-CM | POA: Diagnosis not present

## 2021-01-16 DIAGNOSIS — R41840 Attention and concentration deficit: Secondary | ICD-10-CM | POA: Diagnosis not present

## 2021-01-16 DIAGNOSIS — R278 Other lack of coordination: Secondary | ICD-10-CM

## 2021-01-16 DIAGNOSIS — R2689 Other abnormalities of gait and mobility: Secondary | ICD-10-CM

## 2021-01-16 DIAGNOSIS — R208 Other disturbances of skin sensation: Secondary | ICD-10-CM | POA: Diagnosis not present

## 2021-01-16 DIAGNOSIS — R41844 Frontal lobe and executive function deficit: Secondary | ICD-10-CM | POA: Diagnosis not present

## 2021-01-16 DIAGNOSIS — M6281 Muscle weakness (generalized): Secondary | ICD-10-CM

## 2021-01-16 NOTE — Patient Instructions (Addendum)
RETURN TO DRIVING PLAN:   Communicate with physician first and get clearance before beginning any driving.   Once cleared:  WITH THE SUPERVISION OF A LICENSED DRIVER, PLEASE DRIVE IN AN EMPTY PARKING LOT FOR AT LEAST 2-3 TRIALS TO TEST REACTION TIME, VISION, USE OF EQUIPMENT IN CAR, ETC.   IF SUCCESSFUL WITH THE PARKING LOT DRIVING, PROCEED TO SUPERVISED DRIVING TRIALS IN YOUR NEIGHBORHOOD STREETS AT LOW TRAFFIC TIMES TO TEST OBSERVATION TO TRAFFIC SIGNALS, REACTION TIME, ETC. PLEASE ATTEMPT AT LEAST 2-3 TRIALS IN YOUR NEIGHBORHOOD.   IF NEIGHBORHOOD DRIVING IS SUCCESSFUL, YOU MAY PROCEED TO DRIVING IN BUSIER AREAS IN YOUR COMMUNITY WITH SUPERVISION OF A LICENSED DRIVER. PLEASE ATTEMPT AT LEAST 4-5 TRIALS.   IF COMMUNITY DRIVING IS SUCCESSFUL, YOU MAY PROCEED TO DRIVING ALONE, DURING THE DAY TIME, IN NON-PEAK TRAFFIC TIMES. YOU SHOULD DRIVE NO FURTHER THAN 15 MINUTES IN ONE DIRECTION. PLEASE DO NOT DRIVE IF YOU FEEL FATIGUED OR UNDER THE INFLUENCE OF MEDICATION.    Local Driver Evaluation Programs:  Comprehensive Evaluation: includes clinical and in vehicle behind the wheel testing by OCCUPATIONAL THERAPIST. Programs have varying levels of adaptive controls available for trial.   [employer], [place] [address] [place], [place]  [address] [phone] or [phone] [email or web address].[email or web address]-[email or web address] Evaluator:  Richelle Ito, OT/CDRS/CDI/SCDCM/[facility] [address] [place], [place] [address] [phone] [email or web address] Evaluators:  Bertram Savin, OT and Mertie Clause, OT  [employer] [facility]) [place] ([place]) Physical [place] [address] [place], [place]  [address] [phone] Evaluators:  Bernadene Bell, KT; Heron Sabins, KT;  Shirlee Latch, KT (KT=kiniesotherapist)   Clinical evaluations only:  Includes clinical testing, refers to other programs or local certified driving instructor for behind the wheel testing.  [facility] (outpatient Rehab) [place] [address] [place], [place] [address] [phone] for scheduling [email or web address] Evaluators:  Valentino Hue, OT; Haynes Hoehn, OT  Other area clinical evaluators available upon request including [facility], [facility] and [facility].       Resource List What is a Warden/ranger: Your Road Ahead - A Guide to [employer] Evaluations http://www.thehartford.com/resources/mature-market-excellence/publications-on-aging  Association for Musician - Disability and Driving Fact Sheets http://www.aded.net/?page=510  Driving after a Brain Injury: Brain Injury Association of [employer] [place].be?A=SearchResult&SearchID=9495675&ObjectID=2758842&ObjectType=35  Driving with Adaptive Equipment: Chiropractor Association [email or web address]

## 2021-01-16 NOTE — Therapy (Signed)
[facility] [address] [place], [place], [address] Phone: [phone]   Fax:  [phone]  Occupational Therapy Treatment/Discharge  Patient Details  Name: Jonathon [facility] MRN: [id number] Date of Birth: 03-17-1951 Referring Provider (OT): Ellouise Newer, MD   Encounter Date: 01/16/2021   OT End of Session - 01/16/21 1319     Visit Number 6    Number of Visits 9    Date for OT Re-Evaluation 02/01/21    Authorization Type [employer] Medicare    Authorization Time Period $20 copay each - PT/OT Auth Req'd    OT Start Time 1319    OT Stop Time 1357    OT Time Calculation (min) 38 min    Activity Tolerance Patient tolerated treatment well    Behavior During Therapy WFL for tasks assessed/performed             Past Medical History:  Diagnosis Date   Allergy    Arthritis    not dx'd   Asthma    mild    Cholecystitis    CLL (chronic lymphocytic leukemia) ([place])    stage 0, oncologist Dr. Karle Starch in [place] [facility]     Depression    GERD (gastroesophageal reflux disease)    controlled with nexium    Hypertension     Past Surgical History:  Procedure Laterality Date   APPENDECTOMY     CARPAL TUNNEL RELEASE     left    CHOLECYSTECTOMY N/A 04/20/2019   Procedure: LAPAROSCOPIC CHOLECYSTECTOMY;  Surgeon: Greer Pickerel, MD;  [facility];  Service: General;  Laterality: N/A;   COLON SURGERY  2002   10 inches of colon taken out    COLONOSCOPY     ENDOSCOPIC RETROGRADE CHOLANGIOPANCREATOGRAPHY (ERCP) WITH PROPOFOL N/A 03/01/2019   Procedure: ENDOSCOPIC RETROGRADE CHOLANGIOPANCREATOGRAPHY (ERCP) WITH PROPOFOL;  Surgeon: [name], MD;  Location: [facility];  Service: Gastroenterology;  Laterality: N/A;   ERCP  03/01/2019   HERNIA REPAIR     bilateral inguinal    IR CHOLANGIOGRAM EXISTING TUBE  03/25/2019   IR PERC CHOLECYSTOSTOMY  03/02/2019   left heel reconstruction  01/2002   17 screws and  2 plates    POLYPECTOMY     REMOVAL OF STONES  03/01/2019   Procedure: REMOVAL OF STONES;  Surgeon: [name], MD;  Location: [facility];  Service: Gastroenterology;;   [name]  03/01/2019   Procedure: [name];  Surgeon: Mansouraty, Telford Nab., MD;  Location: [facility];  Service: Gastroenterology;;   tooth extracted     with laughing gas    UPPER GASTROINTESTINAL ENDOSCOPY      There were no vitals filed for this visit.   Physical/Cognitive with 90% accuracy for word generation of foods in alphabetical order and min cues for continuing to toss ball.   Constant Therapy Alphabetizing Alternating Words (uppercase/lowercase) level 1 with 85% accuracy and 60.83s response time. Pt required max assistance for understanding the concept and directions of the task. Once understood the directions, patient completed with min difficulty. Read a Map level 2 with 80% and 27.45s response time.      OCCUPATIONAL THERAPY DISCHARGE SUMMARY  Visits from Start of Care: 6  Current functional level related to goals / functional outcomes: Pt is completing all ADLs and IADLs with mod I.     Remaining deficits: Currently not driving. Continues to have cognitive deficits that were present at baseline. Pt also has some decreased coordination in LUE however much improved.  Education / Equipment: HEP for theraputty and theraband and coordination HEP.   Patient agrees to discharge. Patient goals were met. Patient is being discharged due to meeting the stated rehab goals..     Pt is interested in return to driving. Instructed on graduated driving recommendations and will send a note to the referring physician, however, told patient that the decision for return to driving cannot be cleared by OT.                OT Education - 01/16/21 1342     Education Details return to driving recommendations and driver evaluation information - see pt instructions.     Person(s) Educated Patient;Spouse    Methods Explanation;Handout    Comprehension Verbalized understanding              OT Short Term Goals - 01/09/21 1324       OT SHORT TERM GOAL #1   Title Pt will be independent with HEP for grip strength and coordination    Time 3    Period Weeks    Status Achieved    Target Date 01/11/21      OT SHORT TERM GOAL #2   Title Pt will complete environmental scanning with 90% accuracy or greater for increase in overall attention.    Time 3    Period Weeks    Status Achieved   88% accuracy on 01/03/21     OT SHORT TERM GOAL #3   Title Pt will increase grip strength in LUE by 5 lbs in order to increase overall functional use of LUE.    Baseline L 38.1, R 63.4    Time 3    Period Weeks    Status Achieved   68.6 LUE 01/09/21     OT SHORT TERM GOAL #4   Title Pt will improve 9 hole peg test score by 3 seconds in LUE for increase in fine motor coordination    Baseline L 40.16s, R 26.32s    Time 3    Period Weeks    Status Achieved   LUE 32 s 01/09/21     OT SHORT TERM GOAL #5   Title Pt will verbalize understanding of sensory strategies for safety with ADLs and IADLs.    Time 3    Period Weeks    Status Achieved               OT Long Term Goals - 01/16/21 1320       OT LONG TERM GOAL #1   Title Pt will be independent with proximal strength HEP for BUE    Time 6    Period Weeks    Status Achieved   theraband exercises     OT LONG TERM GOAL #2   Title Pt will improve fine motor coordination in LUE by completing 9 hole peg test in 35 seconds or less.    Baseline R 40.16s, L 26.32s    Time 6    Period Weeks    Status Achieved   LUE 32s 01/09/21     OT LONG TERM GOAL #3   Title Pt will improve grip strength in LUE by 10 lbs in order to increase functional use of LUE.    Baseline R 63.4, L 38.1    Time 6    Period Weeks    Status Achieved   68.6 lb with LUE 01/09/21     OT LONG TERM GOAL #4   Title Pt will complete  physical and  cognitive task simultaneously with 90% accuracy or greater for increasing dual task ability.    Time 6    Period Weeks    Status Achieved   needed min cues for continuing to throw the ball. 01/16/21     OT LONG TERM GOAL #5   Title Pt and spouse will verbalize understanding of return to driving recommendations    Time 6    Period Weeks    Status Achieved                   Plan - 01/16/21 1337     Clinical Impression Statement Pt has met all goals and is ready for discharge from OT.    OT Occupational Profile and History Detailed Assessment- Review of Records and additional review of physical, cognitive, psychosocial history related to current functional performance    Occupational performance deficits (Please refer to evaluation for details): IADL's;ADL's    Body Structure / Function / Physical Skills ADL;FMC;Strength;UE functional use;Sensation;Coordination;Proprioception;GMC;Dexterity;IADL;Endurance;Decreased knowledge of use of DME    Cognitive Skills Attention;Problem Solve;Memory;Sequencing    Rehab Potential Good    Clinical Decision Making Limited treatment options, no task modification necessary    Comorbidities Affecting Occupational Performance: None    Modification or Assistance to Complete Evaluation  No modification of tasks or assist necessary to complete eval    OT Frequency 2x / week   2x/week for 2 weeks and 1x/week for 4 weeks   OT Duration 6 weeks   6 weeks total   OT Treatment/Interventions Self-care/ADL training;Fluidtherapy;DME and/or AE instruction;Therapeutic exercise;Cognitive remediation/compensation;Therapeutic activities;Passive range of motion;Patient/family education;Manual Therapy;Energy conservation;Neuromuscular education;Functional Mobility Training;Scar mobilization    Plan OT discharge    Consulted and Agree with Plan of Care Patient;Family member/caregiver    Family Member Consulted spouse, [name]             Patient will benefit from  skilled therapeutic intervention in order to improve the following deficits and impairments:   Body Structure / Function / Physical Skills: ADL, [place], Strength, UE functional use, Sensation, Coordination, Proprioception, GMC, Dexterity, IADL, Endurance, Decreased knowledge of use of DME Cognitive Skills: Attention, Problem Solve, Memory, Sequencing     Visit Diagnosis: Muscle weakness (generalized)  Other lack of coordination  Unsteadiness on feet  Attention and concentration deficit  Frontal lobe and executive function deficit  Other abnormalities of gait and mobility  Other disturbances of skin sensation    Problem List Patient Active Problem List   Diagnosis Date Noted   Acute ischemic VBA thalamic stroke, right ([place]) 11/24/2020   Mild intermittent asthma 01/28/2020   Dyspnea 05/11/2019   Thrombocytosis 05/11/2019   Alcohol dependence ([place]) 05/11/2019   Abnormal findings on diagnostic imaging of gall bladder 05/10/2019   Essential hypertension 04/22/2019   Post-operative complication 54/00/8676   Acute respiratory failure with hypoxia ([place]) 04/21/2019   SIRS (systemic inflammatory response syndrome) ([place]) 04/21/2019   Status post laparoscopic cholecystectomy 04/21/2019   HCAP (healthcare-associated pneumonia) 04/21/2019   CLL (chronic lymphocytic leukemia) ([place]) 04/21/2019   Calculous cholecystitis 02/28/2019   Campylobacter diarrhea    Diarrhea of infectious origin    STEC (Shiga toxin-producing Escherichia coli)    Elevated LFTs    Hypokalemia    Gastroenteritis 01/19/2017   Sepsis ([place]) 01/19/2017   GERD (gastroesophageal reflux disease) 01/19/2017   Depression 01/19/2017    Zachery Conch  MOT, OTR/L  01/16/2021, 1:59 PM  [facility]  [place], [place], [address] Phone: [phone]   Fax:  [phone]  Name: Jonathon [facility] MRN: [id number] Date of Birth: [birth year]

## 2021-01-18 DIAGNOSIS — Z0000 Encounter for general adult medical examination without abnormal findings: Secondary | ICD-10-CM | POA: Diagnosis not present

## 2021-01-18 DIAGNOSIS — Z1331 Encounter for screening for depression: Secondary | ICD-10-CM | POA: Diagnosis not present

## 2021-01-18 DIAGNOSIS — E785 Hyperlipidemia, unspecified: Secondary | ICD-10-CM | POA: Diagnosis not present

## 2021-01-18 DIAGNOSIS — Z9181 History of falling: Secondary | ICD-10-CM | POA: Diagnosis not present

## 2021-01-25 ENCOUNTER — Ambulatory Visit: Payer: Medicare PPO

## 2021-01-25 ENCOUNTER — Encounter: Payer: Medicare PPO | Admitting: Occupational Therapy

## 2021-01-30 ENCOUNTER — Ambulatory Visit: Payer: Medicare PPO

## 2021-01-30 ENCOUNTER — Encounter: Payer: Medicare PPO | Admitting: Occupational Therapy

## 2021-02-01 ENCOUNTER — Encounter: Payer: Medicare PPO | Admitting: Occupational Therapy

## 2021-02-01 ENCOUNTER — Other Ambulatory Visit: Payer: Self-pay

## 2021-02-01 ENCOUNTER — Ambulatory Visit: Payer: Medicare PPO | Admitting: Physical Therapy

## 2021-02-01 VITALS — BP 125/73 | HR 55

## 2021-02-01 DIAGNOSIS — R208 Other disturbances of skin sensation: Secondary | ICD-10-CM | POA: Diagnosis not present

## 2021-02-01 DIAGNOSIS — R2689 Other abnormalities of gait and mobility: Secondary | ICD-10-CM

## 2021-02-01 DIAGNOSIS — R41840 Attention and concentration deficit: Secondary | ICD-10-CM | POA: Diagnosis not present

## 2021-02-01 DIAGNOSIS — R2681 Unsteadiness on feet: Secondary | ICD-10-CM | POA: Diagnosis not present

## 2021-02-01 DIAGNOSIS — M6281 Muscle weakness (generalized): Secondary | ICD-10-CM | POA: Diagnosis not present

## 2021-02-01 DIAGNOSIS — R278 Other lack of coordination: Secondary | ICD-10-CM | POA: Diagnosis not present

## 2021-02-01 DIAGNOSIS — R41844 Frontal lobe and executive function deficit: Secondary | ICD-10-CM | POA: Diagnosis not present

## 2021-02-01 NOTE — Therapy (Signed)
[facility] [address] [place], [place], [address] Phone: [phone]   Fax:  [phone]  Physical Therapy Treatment & D/C summary  Patient Details  Name: Jonathon Parker [facility] MRN: [id number] Date of Birth: 1951/07/26 Referring Provider (PT): Ellouise Newer   Encounter Date: 02/01/2021   PT End of Session - 02/01/21 1348     Visit Number 5    Number of Visits 9    Date for PT Re-Evaluation 02/15/21    Authorization Type humana medicare    PT Start Time 1315    PT Stop Time 1345    PT Time Calculation (min) 30 min    Equipment Utilized During Treatment Gait belt    Activity Tolerance Patient tolerated treatment well    Behavior During Therapy WFL for tasks assessed/performed             Past Medical History:  Diagnosis Date   Allergy    Arthritis    not dx'd   Asthma    mild    Cholecystitis    CLL (chronic lymphocytic leukemia) ([place])    stage 0, oncologist Dr. Karle Starch in [facility]     Depression    GERD (gastroesophageal reflux disease)    controlled with nexium    Hypertension     Past Surgical History:  Procedure Laterality Date   APPENDECTOMY     CARPAL TUNNEL RELEASE     left    CHOLECYSTECTOMY N/A 04/20/2019   Procedure: LAPAROSCOPIC CHOLECYSTECTOMY;  Surgeon: Greer Pickerel, MD;  Location: [facility];  Service: General;  Laterality: N/A;   COLON SURGERY  2002   10 inches of colon taken out    COLONOSCOPY     ENDOSCOPIC RETROGRADE CHOLANGIOPANCREATOGRAPHY (ERCP) WITH PROPOFOL N/A 03/01/2019   Procedure: ENDOSCOPIC RETROGRADE CHOLANGIOPANCREATOGRAPHY (ERCP) WITH PROPOFOL;  Surgeon: [name], MD;  Location: [place];  Service: Gastroenterology;  Laterality: N/A;   ERCP  03/01/2019   HERNIA REPAIR     bilateral inguinal    IR CHOLANGIOGRAM EXISTING TUBE  03/25/2019   IR PERC CHOLECYSTOSTOMY  03/02/2019   left heel reconstruction  01/2002   17 screws and 2 plates     POLYPECTOMY     REMOVAL OF STONES  03/01/2019   Procedure: REMOVAL OF STONES;  Surgeon: [name], MD;  Location: [facility];  Service: Gastroenterology;;   [name]  03/01/2019   Procedure: [name];  Surgeon: Mansouraty, Telford Nab., MD;  Location: [place] [facility];  Service: Gastroenterology;;   tooth extracted     with laughing gas    UPPER GASTROINTESTINAL ENDOSCOPY      Vitals:   02/01/21 1319  BP: 125/73  Pulse: (!) 55     Subjective Assessment - 02/01/21 1315     Subjective Pt reports he is doing well - is ready to start driving - feels ready for discharge today    Patient is accompained by: Family member   wife   Pertinent History CLL,  HTN, GERD, Depression, mild cognitive impairment.    Patient Stated Goals Pt would like to return to prior level of function with blood pressure behaving as well.    Currently in Pain? No/denies                [facility] PT Assessment - 02/01/21 0001       Functional Gait  Assessment   Gait assessed  Yes    Gait Level Surface Walks 20 ft in less than 5.5 sec,  no assistive devices, good speed, no evidence for imbalance, normal gait pattern, deviates no more than 6 in outside of the 12 in walkway width.    Change in Gait Speed Able to smoothly change walking speed without loss of balance or gait deviation. Deviate no more than 6 in outside of the 12 in walkway width.    Gait with Horizontal Head Turns Performs head turns smoothly with no change in gait. Deviates no more than 6 in outside 12 in walkway width    Gait with Vertical Head Turns Performs head turns with no change in gait. Deviates no more than 6 in outside 12 in walkway width.    Gait and Pivot Turn Pivot turns safely within 3 sec and stops quickly with no loss of balance.    Step Over Obstacle Is able to step over 2 stacked shoe boxes taped together (9 in total height) without changing gait speed. No evidence of imbalance.    Gait with Narrow Base of  Support Is able to ambulate for 10 steps heel to toe with no staggering.    Gait with Eyes Closed Walks 20 ft, uses assistive device, slower speed, mild gait deviations, deviates 6-10 in outside 12 in walkway width. Ambulates 20 ft in less than 9 sec but greater than 7 sec.    Ambulating Backwards Walks 20 ft, no assistive devices, good speed, no evidence for imbalance, normal gait    Steps Alternating feet, no rail.    Total Score 29                           [facility] Adult PT Treatment/Exercise - 02/01/21 0001       Ambulation/Gait   Gait velocity 7.47 secs = 1.14 m/s    Stairs Yes    Stairs Assistance 6: Modified independent (Device/Increase time)    Stair Management Technique No rails    Number of Stairs 4    Height of Stairs 6                      PT Short Term Goals - 02/01/21 1349       PT SHORT TERM GOAL #1   Title Pt will decrease 5 x sit to stand from 17.83 sec to <15 sec for improved balance and functional strength.    Baseline 12/21/20 17.83 sec from chair without hands. 01/09/21 14.16 sec from chair without hands.    Time 3    Period Weeks    Status Achieved    Target Date 01/11/21      PT SHORT TERM GOAL #2   Title Pt will ambulate >500' on varied surfaces supervision for improved community mobility.    Baseline 01/09/21 Pt ambulated 850' independently on varied surfaces.    Time 3    Period Weeks    Status Achieved    Target Date 01/11/21      PT SHORT TERM GOAL #3   Title Pt will ambulate up/down 8 steps independently in reciprocal pattern with rails for improved community access.    Baseline 01/09/21 12 steps with reciprocal pattern with 1 rail mod I    Time 3    Period Weeks    Status Achieved    Target Date 01/11/21               PT Long Term Goals - 02/01/21 1320       PT LONG TERM GOAL #1  Title Pt will be independent with HEP for balance and strengthening to continue gains on own.    Baseline met 02-01-21    Time 6     Period Weeks    Status Achieved      PT LONG TERM GOAL #2   Title Pt will increase gait speed to >1.36ms for improved gait safety in community.    Baseline 12/21/20 1.051m; 1.34 m/sec on 02-01-21    Time 6    Period Weeks    Status Achieved      PT LONG TERM GOAL #3   Title Pt will increas FGA to >24/30 for improved gait safety.    Baseline 12/21/20 21/30; score 29/30 (02-02-19)    Time 6    Period Weeks    Status Achieved      PT LONG TERM GOAL #4   Title Pt will ambulate >1000' on varied surfaces independently for improved community mobility.    Baseline met per pt report - NT outside terrain due to heat - 02-01-21    Time 6    Period Weeks    Status Achieved      PT LONG TERM GOAL #5   Title Pt will incresae FOTO to 77% for decreased self perceived disability.    Baseline 12/21/20 72%;  02-01-21 90%    Time 6    Period Weeks    Status Achieved                   Plan - 02/01/21 1351     Clinical Impression Statement Pt has met 5/5 LTG's - pt states he wants to return to driving but doesn't have an appt with neurologist until August - pt was advised to discuss this with his PCP as pt has met all LTG's in PT and has been discharged from OT.  Balance and gait are WNL's.  D/C due to goals met and no further needs identified.    Personal Factors and Comorbidities Comorbidity 3+    Comorbidities HTN, GERD, Depression, mild cognitive impairment, CLL    Examination-Activity Limitations Stairs;Locomotion Level    Examination-Participation Restrictions Community Activity;Yard Work;Driving    Stability/Clinical Decision Making Evolving/Moderate complexity    Rehab Potential Good    PT Frequency 2x / week   followed by 1x/week for 4 weeks. Plus initial eval.   PT Duration 2 weeks    PT Treatment/Interventions Neuromuscular re-education;Balance training;Therapeutic exercise;Therapeutic activities;Functional mobility training;Gait training;ADLs/Self Care Home  Management;Patient/family education;Manual techniques;Passive range of motion;Vestibular    PT Next Visit Plan D/C on 02-01-21    PT Home Exercise Plan [facility] Agree with Plan of Care Patient;Family member/caregiver    Family Member Consulted wife             Patient will benefit from skilled therapeutic intervention in order to improve the following deficits and impairments:  Abnormal gait, Decreased balance, Decreased activity tolerance, Decreased mobility, Decreased strength, Impaired sensation  Visit Diagnosis: Unsteadiness on feet  Other abnormalities of gait and mobility     Problem List Patient Active Problem List   Diagnosis Date Noted   Acute ischemic VBA thalamic stroke, right ([place]/22/2022   Mild intermittent asthma 01/28/2020   Dyspnea 05/11/2019   Thrombocytosis 05/11/2019   Alcohol dependence ([facility]10/01/2019   Abnormal findings on diagnostic imaging of gall bladder 05/10/2019   Essential hypertension 04/22/2019   Post-operative complication 0981/[id number] Acute respiratory failure with hypoxia ([facility]09/16/2020   SIRS (  systemic inflammatory response syndrome) ([place]) 04/21/2019   Status post laparoscopic cholecystectomy 04/21/2019   HCAP (healthcare-associated pneumonia) 04/21/2019   CLL (chronic lymphocytic leukemia) ([place]) 04/21/2019   Calculous cholecystitis 02/28/2019   Campylobacter diarrhea    Diarrhea of infectious origin    STEC (Shiga toxin-producing Escherichia coli)    Elevated LFTs    Hypokalemia    Gastroenteritis 01/19/2017   Sepsis ([place]) 01/19/2017   GERD (gastroesophageal reflux disease) 01/19/2017   Depression 01/19/2017      PHYSICAL THERAPY DISCHARGE SUMMARY  Visits from Start of Care:  5  Current functional level related to goals / functional outcomes: Pt has met 5/5 LTG's; balance and gait are WNL's   Remaining deficits: None regarding mobility Pt does report decreased sensation in LUE   Education /  Equipment: Pt has been instructed in HEP for strengthening and balance exercises.   Patient agrees to discharge. Patient goals were met. Patient is being discharged due to meeting the stated rehab goals.   NIOEVO, JJKKX FGHWEXH, PT 02/01/2021, 7:31 PM  [facility] [address] [place], [place], [address] Phone: [phone]   Fax:  [phone]  Name: Jonathon Parker [facility] MRN: [id number] Date of Birth: 1950-12-11

## 2021-02-02 ENCOUNTER — Encounter: Payer: Medicare PPO | Admitting: Occupational Therapy

## 2021-02-02 ENCOUNTER — Ambulatory Visit: Payer: Medicare PPO | Admitting: Physical Therapy

## 2021-02-09 DIAGNOSIS — B9689 Other specified bacterial agents as the cause of diseases classified elsewhere: Secondary | ICD-10-CM | POA: Diagnosis not present

## 2021-02-09 DIAGNOSIS — J019 Acute sinusitis, unspecified: Secondary | ICD-10-CM | POA: Diagnosis not present

## 2021-02-22 IMAGING — XA CHOLANGIOGRAM VIA EXISTING CATHETER
2 series · 2 of 2 positions shown · non-contrast
Comparison: none

INDICATION: 68 year-old with acute cholecystitis and cholecystostomy tube placed
on 03/02/2019. Patient reports minimal output from the drain.

[Series 1: single · 1 of 1 slices shown]
[im 1/1]
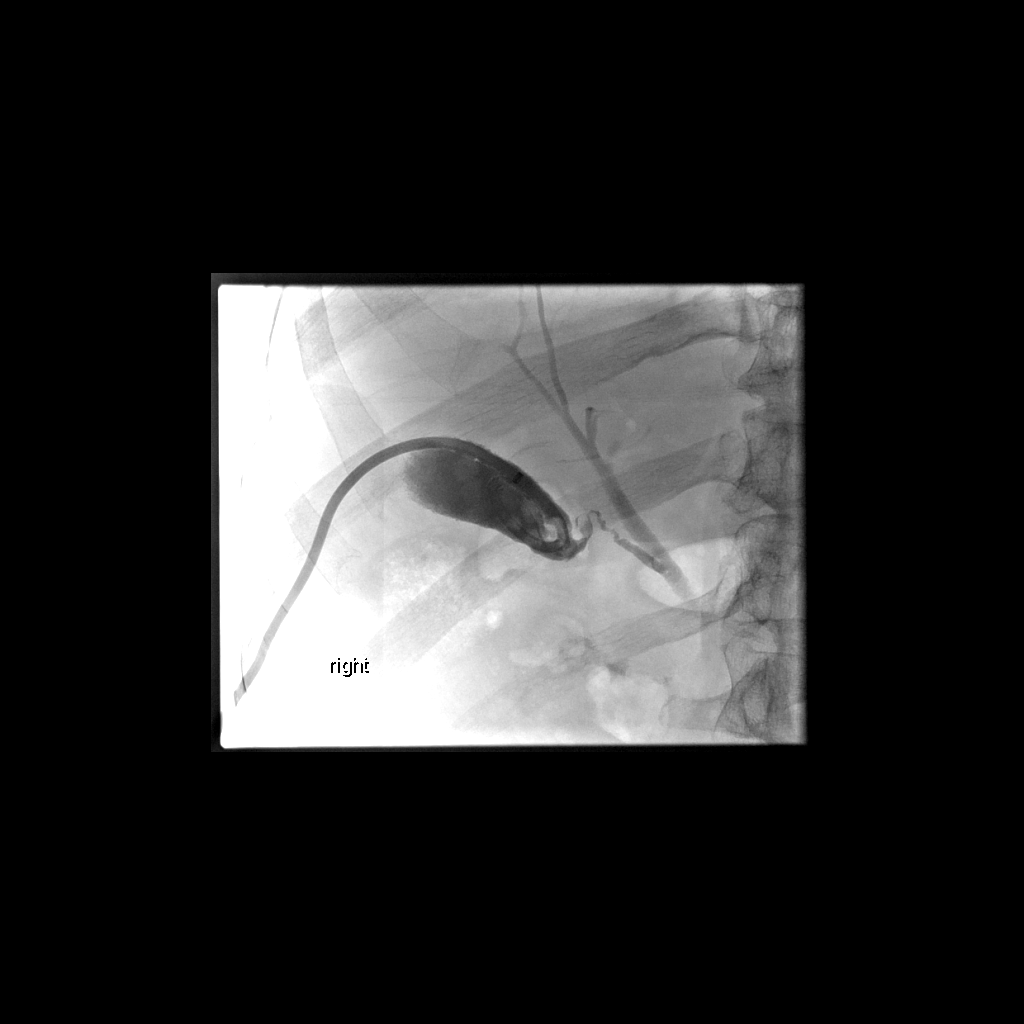

[Series 2: fl (-) angio · 1 of 1 slices shown]
[im 1/1]
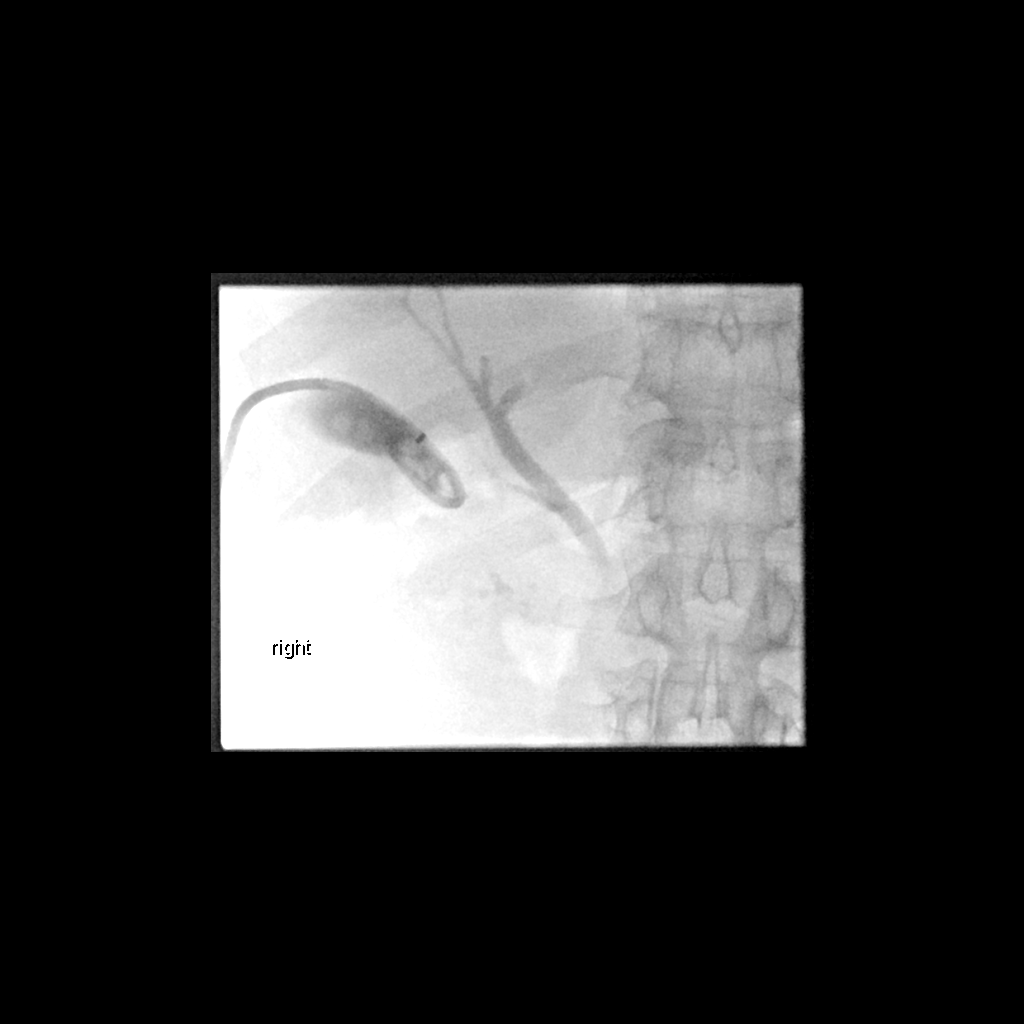

[2 of 2 positions shown; findings below may reference images not displayed]

EXAM:
CHOLECYSTOSTOMY TUBE INJECTION

MEDICATIONS:
None

ANESTHESIA/SEDATION:
None

FLUOROSCOPY TIME:  Fluoroscopy Time: 30 seconds, 3 mGy

CONTRAST:  5 mL Omnipaque 300

COMPLICATIONS:
None immediate.

PROCEDURE:
Patient was placed supine on the interventional table. The
cholecystostomy tube was injected with contrast under fluoroscopic
guidance. Catheter was flushed with saline and attached to the
gravity bag. Patient and wife were instructed on how to flush the
catheter.
FINDINGS: Drain is well positioned in the gallbladder. Filling defects in the
gallbladder compatible with stones. The cystic duct is patent.
Contrast fills the central intrahepatic bile ducts. Common bile duct
is not completely opacified on this examination.
IMPRESSION: Cholecystostomy tube is well positioned in the gallbladder.

Instructed the patient and wife to flush the tube at least once
every other day.

## 2021-02-27 DIAGNOSIS — F5104 Psychophysiologic insomnia: Secondary | ICD-10-CM | POA: Diagnosis not present

## 2021-02-27 DIAGNOSIS — Z125 Encounter for screening for malignant neoplasm of prostate: Secondary | ICD-10-CM | POA: Diagnosis not present

## 2021-02-27 DIAGNOSIS — E559 Vitamin D deficiency, unspecified: Secondary | ICD-10-CM | POA: Diagnosis not present

## 2021-02-27 DIAGNOSIS — I10 Essential (primary) hypertension: Secondary | ICD-10-CM | POA: Diagnosis not present

## 2021-02-27 DIAGNOSIS — J4520 Mild intermittent asthma, uncomplicated: Secondary | ICD-10-CM | POA: Diagnosis not present

## 2021-02-27 DIAGNOSIS — F331 Major depressive disorder, recurrent, moderate: Secondary | ICD-10-CM | POA: Diagnosis not present

## 2021-02-27 DIAGNOSIS — K219 Gastro-esophageal reflux disease without esophagitis: Secondary | ICD-10-CM | POA: Diagnosis not present

## 2021-02-27 DIAGNOSIS — Z139 Encounter for screening, unspecified: Secondary | ICD-10-CM | POA: Diagnosis not present

## 2021-02-27 DIAGNOSIS — R413 Other amnesia: Secondary | ICD-10-CM | POA: Diagnosis not present

## 2021-03-02 ENCOUNTER — Ambulatory Visit: Payer: Medicare PPO | Admitting: Neurology

## 2021-03-02 ENCOUNTER — Encounter: Payer: Self-pay | Admitting: Neurology

## 2021-03-02 ENCOUNTER — Other Ambulatory Visit: Payer: Self-pay

## 2021-03-02 VITALS — BP 120/75 | HR 69 | Resp 18 | Ht 72.0 in | Wt 202.0 lb

## 2021-03-02 DIAGNOSIS — I639 Cerebral infarction, unspecified: Secondary | ICD-10-CM | POA: Diagnosis not present

## 2021-03-02 DIAGNOSIS — G3184 Mild cognitive impairment, so stated: Secondary | ICD-10-CM | POA: Diagnosis not present

## 2021-03-02 DIAGNOSIS — I6381 Other cerebral infarction due to occlusion or stenosis of small artery: Secondary | ICD-10-CM

## 2021-03-02 MED ORDER — DONEPEZIL HCL 5 MG PO TABS: 5.0000 mg | ORAL_TABLET | Freq: Every day | ORAL | 3 refills | Status: DC

## 2021-03-02 NOTE — Patient Instructions (Signed)
Always good to see you. Continue all your medications. Follow-up in 6 months, call for any changes    RECOMMENDATIONS FOR ALL PATIENTS WITH MEMORY PROBLEMS: 1. Continue to exercise (Recommend 30 minutes of walking everyday, or 3 hours every week) 2. Increase social interactions - continue going to [place] and enjoy social gatherings with friends and family 3. Eat healthy, avoid fried foods and eat more fruits and vegetables 4. Maintain adequate blood pressure, blood sugar, and blood cholesterol level. Reducing the risk of stroke and cardiovascular disease also helps promoting better memory. 5. Avoid stressful situations. Live a simple life and avoid aggravations. Organize your time and prepare for the next day in anticipation. 6. Sleep well, avoid any interruptions of sleep and avoid any distractions in the bedroom that may interfere with adequate sleep quality 7. Avoid sugar, avoid sweets as there is a strong link between excessive sugar intake, diabetes, and cognitive impairment The Mediterranean diet has been shown to help patients reduce the risk of progressive memory disorders and reduces cardiovascular risk. This includes eating fish, eat fruits and green leafy vegetables, nuts like almonds and hazelnuts, walnuts, and also use olive oil. Avoid fast foods and fried foods as much as possible. Avoid sweets and sugar as sugar use has been linked to worsening of memory function.

## 2021-03-02 NOTE — Progress Notes (Signed)
NEUROLOGY FOLLOW UP OFFICE NOTE  Jonathon Parker ZO:[id number] Oct 16, 1950  HISTORY OF PRESENT ILLNESS: I had the pleasure of seeing Jonathon Parker in follow-up in the neurology clinic on 03/02/2021. He is again accompanied by his wife who helps supplement the history today. The patient was last seen 2 months ago after he had a right thalamic stroke in April 2022. Stroke workup done, etiology likely small vessel disease. He was started on atorvastatin and aspirin '81mg'$  daily for secondary stroke prevention. He has been discharged from physical/occupational therapy with improvement in symptoms. He denies any weakness, he mostly has numbness on the left arm. Face and leg are unaffected. He denies any headaches, dizziness, vision changes, no falls. Sleep is good. Memory overall stable on Donepezil '5mg'$  daily no side effects. He denies missing medications. His wife manages finances. He has not been driving since the stroke but is eager to resume.    History on Initial Assessment 07/18/2020: This is a 70 year old right-handed man with a history of CLL, hypertension, depression, asthma, presenting for evaluation of memory loss.  He feels his memory is not as good as it used to be. He started noticing changes 2 years ago, however his wife has noticed changes for at least 5 years. She noticed he would get confused about what she is trying to tell him, saying she is not clear. He occasionally repeats himself. He could not recall if he took his medications so they got a pillbox last year which has helped. He manages his own medications. His wife manages finances. He denies getting lost driving. He would forget what he went to get in a room. He misplaces things. He has occasional word-finding difficulties. His wife has noticed some personality changes so Zoloft was added to Wellbutrin. He takes 1/2 tab Zoloft due to dizziness on full tablet. No hallucinations. Sleep is good with Trazodone. He feels his mood is  "sometimes good." His mother had Alzheimer's disease. No history of significant head injuries. He drinks 2 glasses of alcohol at night.   He has pain in his neck and shoulders. He has had some bowel issues since colon surgery. He has had bilateral hand tremors for several years, affecting writing and when drinking from a glass. No family history of tremors. He denies any headaches, dizziness, diplopia, dysarthria, dysphagia, back pain, focal numbness/tingling/weakness, bladder dysfunction, anosmia, no falls. 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   Update 12/12/20: He presents after hospitalization for right thalamic stroke. He started having left arm numbness on 11/24/20. This progressed to left leg and face numbness, then when he got up he had left arm and leg weakness. He was dragging his left arm and leg but drove himself to his PCP office where EMS was called. He had been having issues controlling his BP and was going to his PCP every 2 weeks with different medication changes, SBP running between 160 and 200. In the ER, his NIHSS was 3, he was given TPA with some improvement in weakness. I personally reviewed MRI brain which showed an acute stroke in the lateral aspect of the right thalamus. There was moderate diffuse atrophy and mild chronic microvascular disease. CTA did not show any significant stenosis. TTE showed an EF of 0000000, grade I diastolic dysfunction, left atrium mildly dilated. LDL was 87, he was started on atorvastatin '20mg'$  daily. HbA1c was 5.6. He was discharged home on aspirin '81mg'$  daily for secondary stroke prevention.  Diagnostic Data: MRI brain without contrast done 03/2020 reported advanced atrophy for age. No acute changes. Images unavailable for review. He was started on Donepezil '5mg'$  daily which he is tolerating without side effects.   11/2020: MRI brain which showed an acute stroke in the lateral aspect of the right thalamus. There was  moderate diffuse atrophy and mild chronic microvascular disease. CTA did not show any significant stenosis.  TTE showed an EF of 0000000, grade I diastolic dysfunction, left atrium mildly dilated. 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 He is on Donepezil '5mg'$  daily without side effects.    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 Karle Starch in [facility] hospital     Depression    GERD (gastroesophageal reflux disease)    controlled with nexium    Hypertension     MEDICATIONS: Current Outpatient Medications on File Prior to Visit  Medication Sig Dispense Refill   albuterol (VENTOLIN HFA) 108 (90 Base) MCG/ACT inhaler Inhale 2 puffs into the lungs every 6 (six) hours as needed for wheezing or shortness of breath. 18 g 0   amLODipine (NORVASC) 10 MG tablet Take 1 tablet (10 mg total) by mouth daily. 30 tablet 1   aspirin EC 81 MG EC tablet Take 1 tablet (81 mg total) by mouth daily. Swallow whole. 30 tablet 0   atorvastatin (LIPITOR) 10 MG tablet Take 2 tablets (20 mg total) by mouth daily at 6 PM. 30 tablet 1   budesonide-formoterol (SYMBICORT) 80-4.5 MCG/ACT inhaler Inhale 2 puffs into the lungs in the morning and at bedtime. 1 each 0   buPROPion (WELLBUTRIN XL) 300 MG 24 hr tablet Take 1 tablet (300 mg total) by mouth daily. 30 tablet 0   cholecalciferol (VITAMIN D3) 25 MCG (1000 UNIT) tablet Take 2 tablets (2,000 Units total) by mouth daily. Takes 2 tablets (1,000 units each) for a total of 2,000 units daily     docusate sodium (COLACE) 100 MG capsule Take 1 capsule (100 mg total) by mouth 2 (two) times daily. 60 capsule 0   donepezil (ARICEPT) 5  MG tablet Take 1 tablet (5 mg total) by mouth daily. 30 tablet 0   esomeprazole (NEXIUM) 40 MG capsule Take 1 capsule (40 mg total) by mouth daily. 30 capsule 0   metoprolol succinate (TOPROL-XL) 50 MG 24 hr tablet Take 50 mg by mouth daily. Take with or immediately following a meal.     polyethylene glycol powder (GLYCOLAX/MIRALAX) 17 GM/SCOOP powder Take 1 capful of powder mixed in drink by mouth 1 to 3 times daily Until daily soft stools  OTC 225 g 0   sertraline (ZOLOFT) 50 MG tablet Take 0.5 tablets (25 mg total) by mouth daily. 30 tablet 0   traZODone (DESYREL) 100 MG tablet Take 1 tablet (100 mg total) by mouth at bedtime. 10 tablet 0   valsartan (DIOVAN) 320 MG tablet Take 1 tablet (320 mg total) by mouth daily. 30 tablet 0   ciprofloxacin (CIPRO) 250 MG tablet Take 250 mg by mouth 2 (two) times daily. Thinks is '250mg'$  but will check (Patient not taking: Reported on 03/02/2021)  No current facility-administered medications on file prior to visit.    ALLERGIES: Allergies  Allergen Reactions   Lisinopril Cough    FAMILY HISTORY: Family History  Problem Relation Age of Onset   Prostate cancer Brother    Colon cancer Neg Hx    Esophageal cancer Neg Hx    Colon polyps Neg Hx    Rectal cancer Neg Hx    Stomach cancer Neg Hx     SOCIAL HISTORY: Social History   Socioeconomic History   Marital status: Married    Spouse name: Not on file   Number of children: Not on file   Years of education: Not on file   Highest education level: Not on file  Occupational History   Not on file  Tobacco Use   Smoking status: Never   Smokeless tobacco: Never  Vaping Use   Vaping Use: Never used  Substance and Sexual Activity   Alcohol use: Yes    Comment: 1 drink before supper   Drug use: No   Sexual activity: Yes  Other Topics Concern   Not on file  Social History Narrative   Right handed    Lives with wife   Drinks caffeine    Two story home   Social Determinants of Health    Financial Resource Strain: Not on file  Food Insecurity: Not on file  Transportation Needs: Not on file  Physical Activity: Not on file  Stress: Not on file  Social Connections: Not on file  Intimate Partner Violence: Not on file     PHYSICAL EXAM: Vitals:   03/02/21 1533  BP: 120/75  Pulse: 69  Resp: 18  SpO2: 96%   General: No acute distress Head:  Normocephalic/atraumatic Skin/Extremities: No rash, no edema Neurological Exam: alert and awake. No aphasia or dysarthria. Fund of knowledge is appropriate.  Recent and remote memory are intact.  Attention and concentration are normal.   Cranial nerves: Pupils equal, round. Extraocular movements intact with no nystagmus. Visual fields full.  No facial asymmetry.  Motor: Bulk and tone normal, muscle strength 5/5 throughout with no pronator drift.   Finger to nose testing intact.  Gait narrow-based and steady, able to tandem walk adequately.  Romberg negative.   IMPRESSION: This is a pleasant 80 RH man with a history of CLL, hypertension, depression, asthma, Mild Neurocognitive disorder, with right thalamic stroke last 11/24/2020 likely due to small vessel disease with residual left arm numbness, otherwise doing very well after therapy. Memory stable on Donepezil '5mg'$  daily. Continue aspirin '81mg'$  daily, statin, control of vascular risk factors, for secondary stroke prevention. They know to go to the ER for any sudden change in symptoms. We discussed that he may return to driving. Follow-up in 6 months, call for any changes.   Thank you for allowing me to participate in his care.  Please do not hesitate to call for any questions or concerns.    Ellouise Newer, M.D.   CC: Dr. Delena Bali

## 2021-03-04 ENCOUNTER — Encounter: Payer: Self-pay | Admitting: Neurology

## 2021-03-07 ENCOUNTER — Telehealth: Payer: Self-pay | Admitting: Neurology

## 2021-03-07 NOTE — Telephone Encounter (Signed)
Pt called informed cholesterol medication needs to be Rx by PCP because he will be monitoring his cholesterol. Pt verbalized understanding

## 2021-03-07 NOTE — Telephone Encounter (Signed)
Pls let her know cholesterol medication needs to be Rx by PCP because he will be monitoring his cholesterol, thanks

## 2021-03-07 NOTE — Telephone Encounter (Signed)
Pt wife called in regarding mikes lipitor '20mg'$ . Michela Pitcher he needs a new RX b/c the one he has was from the doctor in hospital. It needs to be sent to [facility] in [place].

## 2021-03-21 IMAGING — CR DG CHEST 2V
2 series · 2 of 2 positions shown · non-contrast
Comparison: Radiograph 02/27/2011

CLINICAL DATA: Postop fever, cholecystectomy 1 day prior

EXAM:
CHEST - 2 VIEW

[w chest pa]
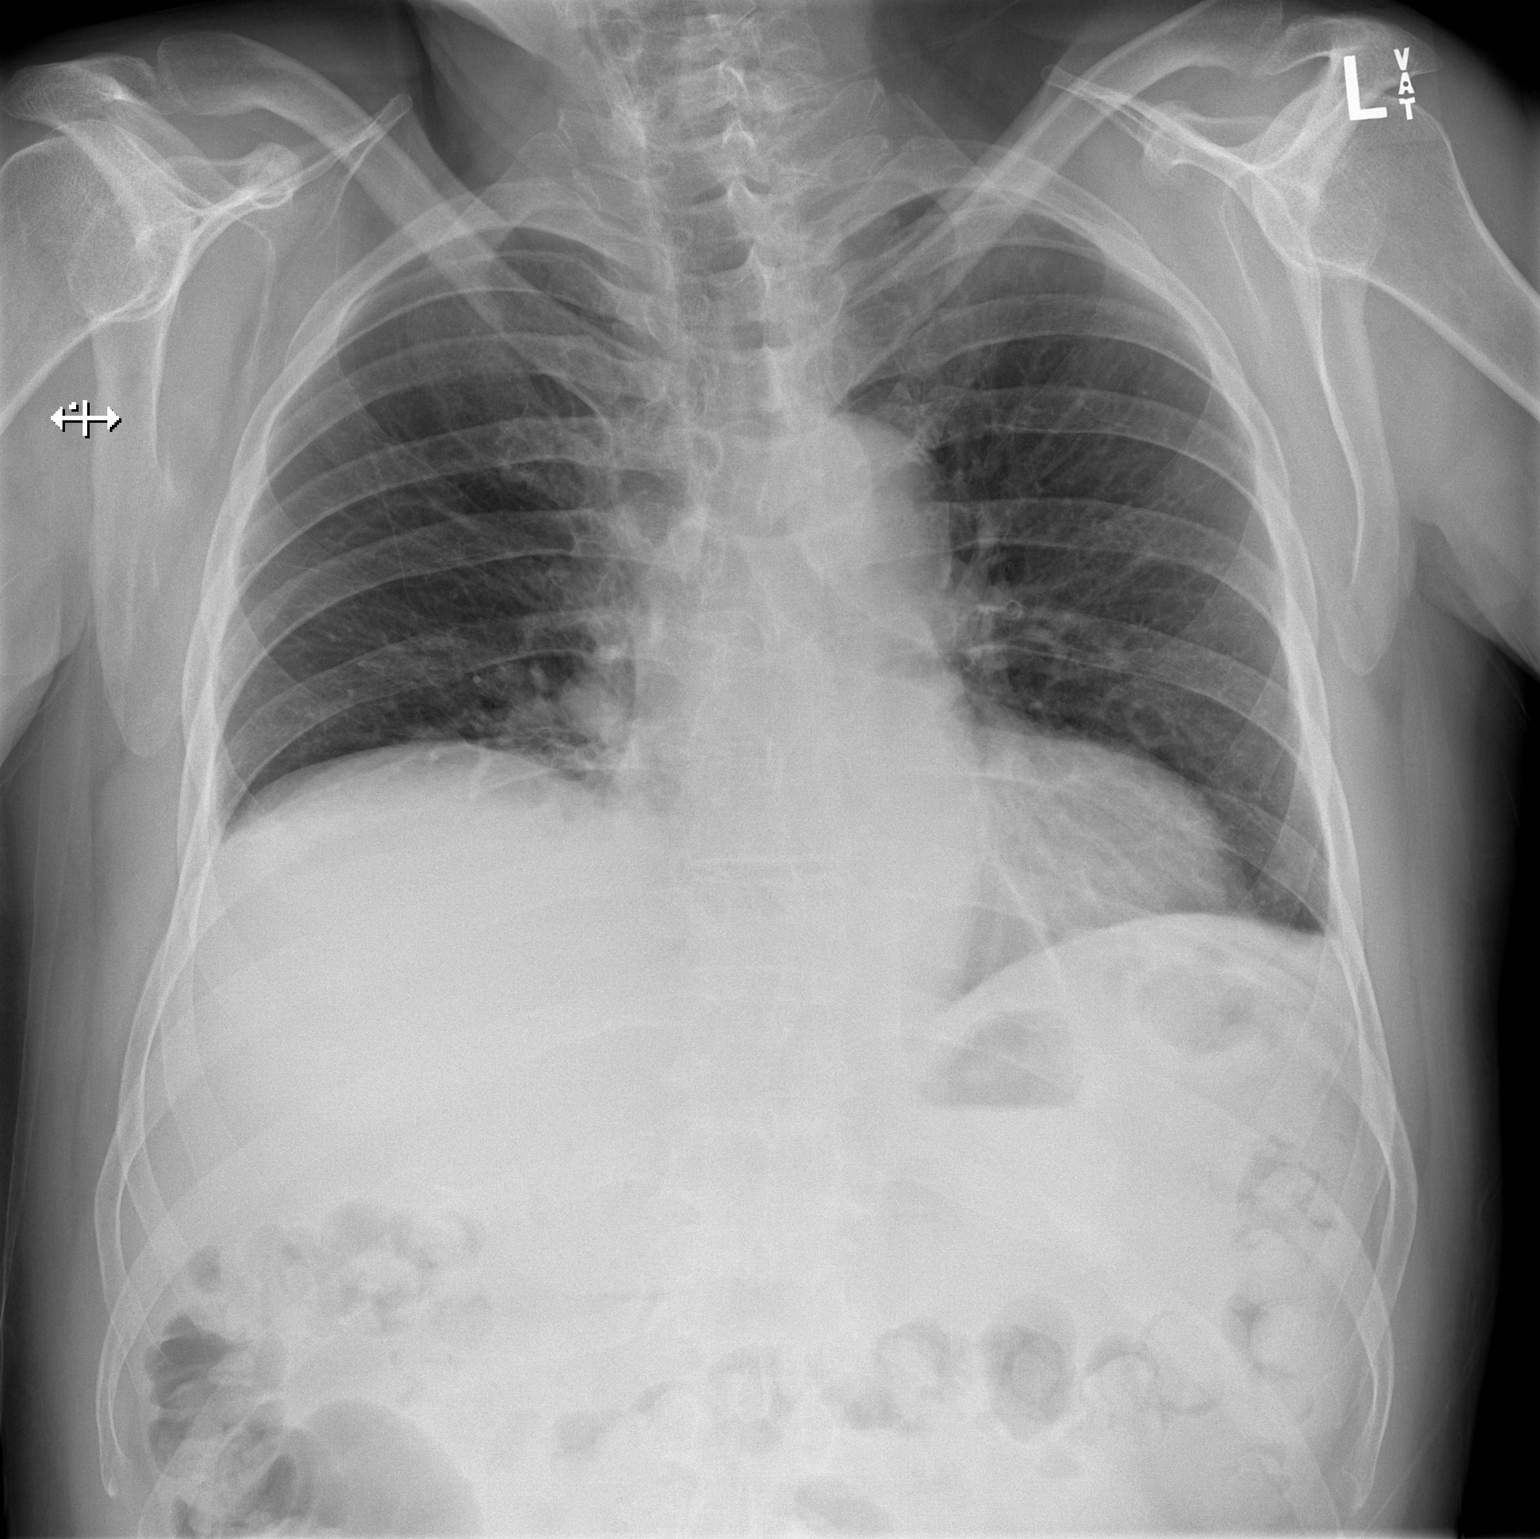

[w chest lat]
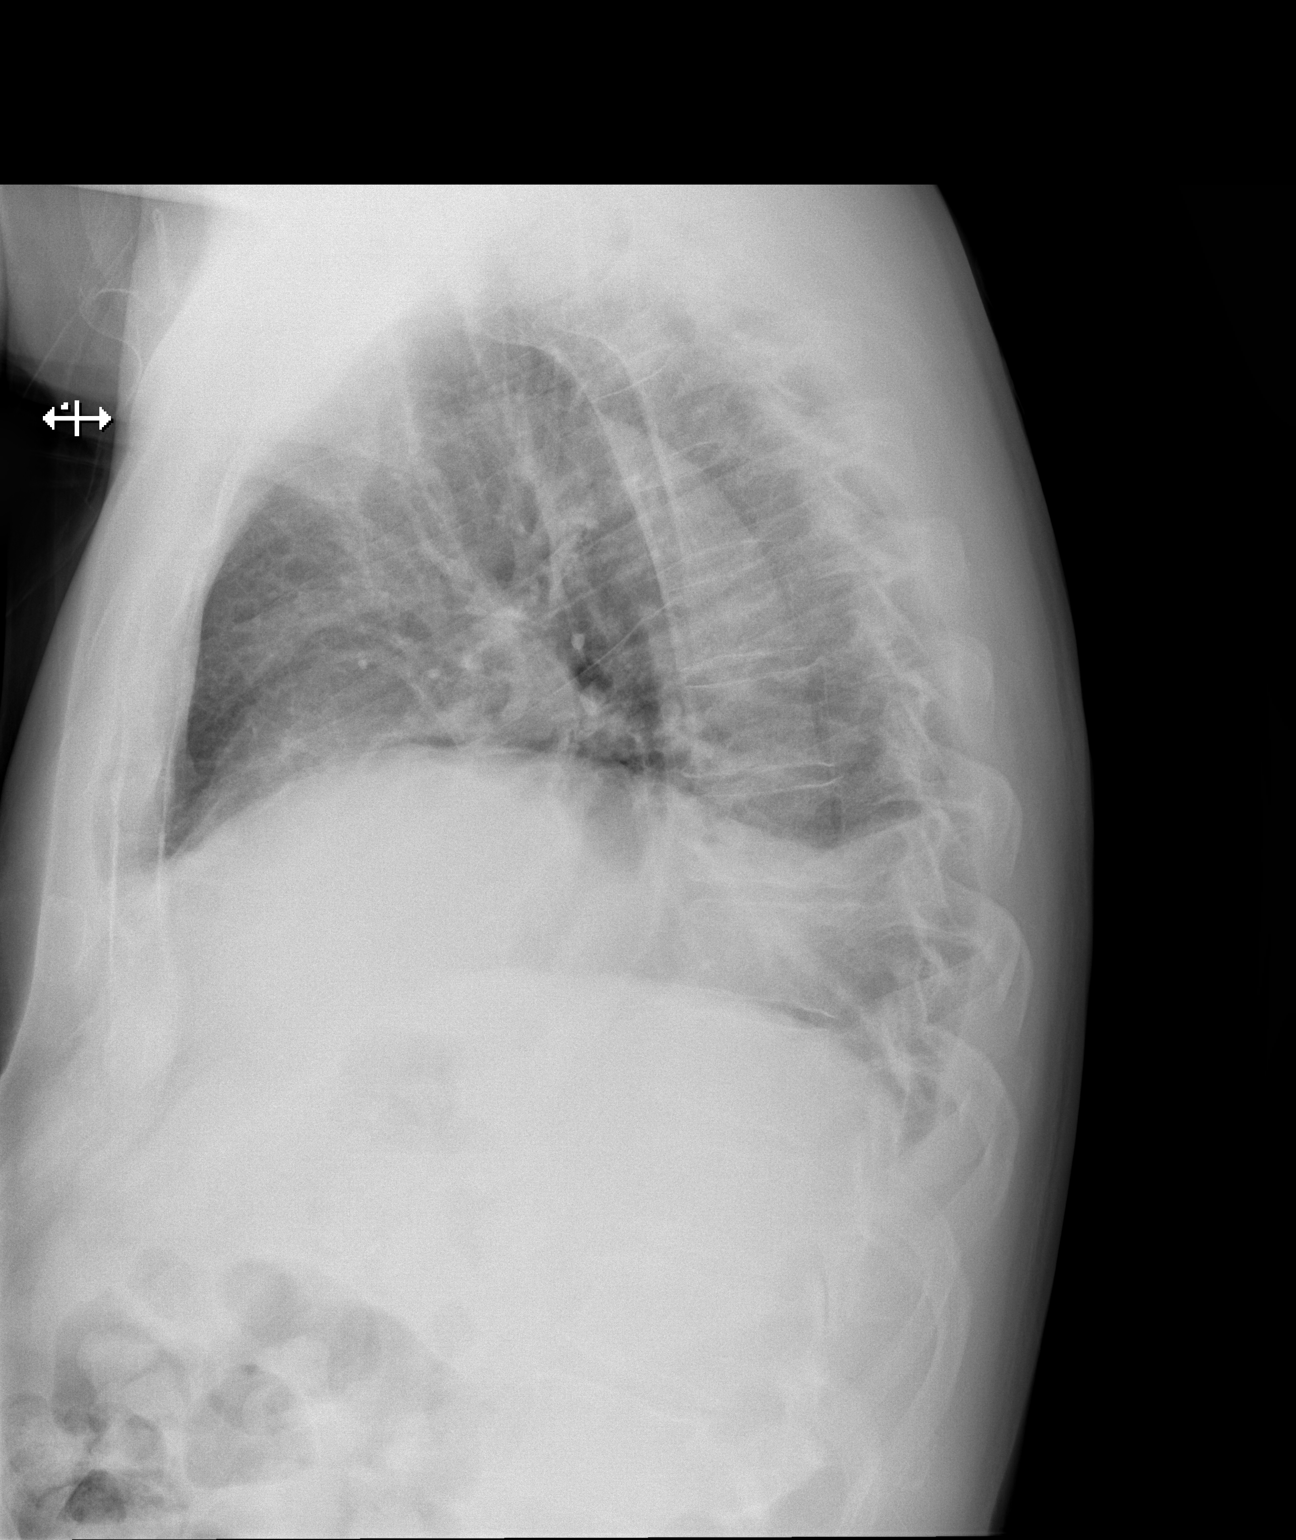

[2 of 2 positions shown; findings below may reference images not displayed]

FINDINGS: Chronic elevation of the right hemidiaphragm. Streaky opacities in
the right lung base most suggestive of postoperative atelectasis.

No consolidation, features of edema, pneumothorax, or effusion.
Pulmonary vascularity is normally distributed. The cardiomediastinal
contours are unremarkable. No acute osseous or soft tissue
abnormality. No convincing evidence of free subdiaphragmatic air.
IMPRESSION: Streaky opacities in the right lung base most suggestive of
postoperative atelectasis. No other acute cardiopulmonary disease.

## 2021-03-27 ENCOUNTER — Ambulatory Visit: Payer: Medicare PPO | Admitting: Neurology

## 2021-04-16 NOTE — Progress Notes (Signed)
[place]  [address] [place],  [place]  [address] [phone]  Clinic Day:  04/23/2021  Referring physician: Nicoletta Dress, MD  This document serves as a record of services personally performed by Hosie Poisson, MD. It was created on their behalf by [facility] E, a trained medical scribe. The creation of this record is based on the scribe's personal observations and the provider's statements to them.  CHIEF COMPLAINT:  CC: Chronic lymphocytic leukemia  Current Treatment:  Observation   HISTORY OF PRESENT ILLNESS:  Jonathon Parker is a 70 y.o. male with chronic lymphocytic leukemia diagnosed in October 2019. He was referred for leukocytosis/lymphocytosis.  His only complaint was fatigue, as well as intermittent adenopathy of his neck.  There was no anemia, thrombocytopenia or splenomegaly.  The white count was 14,000 with 22% neutrophils and 74% lymphocytes.  Hemoglobin was normal at 14.1 and platelet count was 158,000.  Flow cytometry, which confirmed the diagnosis of chronic lymphocytic leukemia.    In January 2020, the white count had jumped from 17 to 45.6 with 90% lymphocytes, but the hemoglobin and platelet count remained normal.  His absolute lymphocyte count had increased from 14,000 to 38,000. This was felt to be due to acute bronchitis he had at the time and he had no evidence of adenopathy or splenomegaly.  He was seen again in March and the white count had improved to 26.3, but still 90% lymphocytes.  The hemoglobin and platelets remained normal. By June of 2020, his white count was down to 15.6 with 56% lymphocytes.  In October, his white count improved to 10.5 with 65.5% lymphocytes.  He underwent a cholecystectomy on September 15th.  Post op he ended up with an abscess and required drains.    In March 2021, white count was 14.2 with 81% lymphocytes. By August, his white count had increased to 22.8 with 81% lymphocytes.  Then in October, white count was 25.7 with 87% lymphocytes. Head MRI from August 2021 revealed no acute intracranial abnormality, but did show dvanced atrophy for age. No specific lobar predilection.  INTERVAL HISTORY:  Jonathon Parker is here for routine follow up and states that he had a stroke in April. This presented with numbness of the left side, including extremities and face. He was transferred to [facility] and given TPA with improvement. He underwent OT and PT. He continues atorvastatin 20 mg and aspirin 81 mg daily.  He still has persistent numbness of the left upper extremity, but otherwise, does not have residual deficit. He just got back from a trip in [place] where he did strenuous walking. He does report increased ankle and leg swelling, even prior to his [place] trip. He denies dysuria, hematuria or frequency. He does have bouts of diarrhea. White count has increased from 22.5 to 67.0 in 6 months time, with 94% lymphocytes, previously 82%. Hemoglobin has decreased from 16.1 to 13.5, and platelets are normal. His  appetite is good but he notes early satiety, and he has lost 3 pounds since his last visit.  He denies fever, chills or other signs of infection.  He denies nausea, vomiting, bowel issues, or abdominal pain.  He denies sore throat, cough, dyspnea, or chest pain.  REVIEW OF SYSTEMS:  Review of Systems  Constitutional:  Positive for appetite change (early satiety). Negative for chills, fatigue, fever and unexpected weight change.  HENT:  Negative.    Eyes: Negative.   Respiratory: Negative.  Negative for chest tightness,  cough, hemoptysis, shortness of breath and wheezing.   Cardiovascular:  Positive for leg swelling. Negative for chest pain and palpitations.  Gastrointestinal:  Positive for diarrhea. Negative for abdominal distention, abdominal pain, blood in stool, constipation, nausea and vomiting.  Endocrine: Negative.   Genitourinary: Negative.  Negative for difficulty urinating,  dysuria, frequency and hematuria.   Musculoskeletal: Negative.  Negative for arthralgias, back pain, flank pain, gait problem and myalgias.  Skin: Negative.   Neurological:  Positive for numbness (of the left upper extremity). Negative for dizziness, extremity weakness, gait problem, headaches, light-headedness, seizures and speech difficulty.  Hematological: Negative.   Psychiatric/Behavioral: Negative.  Negative for depression and sleep disturbance. The patient is not nervous/anxious.   All other systems reviewed and are negative.   VITALS:  Blood pressure (!) 147/79, pulse (!) 55, temperature 98.3 F (36.8 C), temperature source Oral, resp. rate 18, height 6' (1.829 m), weight 202 lb 9.6 oz (91.9 kg), SpO2 96 %.  Wt Readings from Last 3 Encounters:  04/23/21 202 lb 9.6 oz (91.9 kg)  03/02/21 202 lb (91.6 kg)  12/12/20 210 lb (95.3 kg)    Body mass index is 27.48 kg/m.  Performance status (ECOG): 1 - Symptomatic but completely ambulatory  PHYSICAL EXAM:  Physical Exam Constitutional:      General: He is not in acute distress.    Appearance: Normal appearance. He is normal weight.  HENT:     Head: Normocephalic and atraumatic.  Eyes:     General: No scleral icterus.    Extraocular Movements: Extraocular movements intact.     Conjunctiva/sclera: Conjunctivae normal.     Pupils: Pupils are equal, round, and reactive to light.  Cardiovascular:     Rate and Rhythm: Regular rhythm. Bradycardia present.     Pulses: Normal pulses.     Heart sounds: Normal heart sounds. No murmur heard.   No friction rub. No gallop.  Pulmonary:     Effort: Pulmonary effort is normal. No respiratory distress.     Breath sounds: Normal breath sounds.  Abdominal:     General: Bowel sounds are normal. There is no distension.     Palpations: Abdomen is soft. There is splenomegaly (1-2 finger breadths below the left costal margin). There is no hepatomegaly or mass.     Tenderness: There is no abdominal  tenderness.  Musculoskeletal:        General: Normal range of motion.     Cervical back: Normal range of motion and neck supple.     Right lower leg: Edema (trace) present.     Left lower leg: Edema (trace) present.  Lymphadenopathy:     Cervical: No cervical adenopathy.  Skin:    General: Skin is warm and dry.     Comments: Skin turgor is mildly decreased  Neurological:     General: No focal deficit present.     Mental Status: He is alert and oriented to person, place, and time. Mental status is at baseline.  Psychiatric:        Mood and Affect: Mood normal.        Behavior: Behavior normal.        Thought Content: Thought content normal.        Judgment: Judgment normal.    LABS:   CBC Latest Ref Rng & Units 04/23/2021 11/24/2020 11/24/2020  WBC - 67.0 - 33.3(H)  Hemoglobin 13.5 - 17.5 13.5 17.3(H) 16.9  Hematocrit 41 - 53 42 51.0 51.3  Platelets 150 - 399 164 -  193   CMP Latest Ref Rng & Units 11/24/2020 11/24/2020 06/22/2020  Glucose 70 - 99 mg/dL 176(H) 183(H) -  BUN 8 - 23 mg/dL '16 13 16  '$ Creatinine 0.61 - 1.24 mg/dL 1.20 1.30(H) 1.2  Sodium 135 - 145 mmol/L 137 137 140  Potassium 3.5 - 5.1 mmol/L 4.6 5.9(H) 4.4  Chloride 98 - 111 mmol/L 104 104 102  CO2 22 - 32 mmol/L - 25 26(A)  Calcium 8.9 - 10.3 mg/dL - 8.9 9.3  Total Protein 6.5 - 8.1 g/dL - 6.5 -  Total Bilirubin 0.3 - 1.2 mg/dL - 1.5(H) -  Alkaline Phos 38 - 126 U/L - 92 78  AST 15 - 41 U/L - 33 28  ALT 0 - 44 U/L - 19 17     No results found for: LDH   STUDIES:   Allergies:  Allergies  Allergen Reactions   Lisinopril Cough    Current Medications: Current Outpatient Medications  Medication Sig Dispense Refill   albuterol (VENTOLIN HFA) 108 (90 Base) MCG/ACT inhaler Inhale 2 puffs into the lungs every 6 (six) hours as needed for wheezing or shortness of breath. 18 g 0   amLODipine (NORVASC) 10 MG tablet Take 1 tablet (10 mg total) by mouth daily. 30 tablet 1   aspirin EC 81 MG EC tablet Take 1  tablet (81 mg total) by mouth daily. Swallow whole. 30 tablet 0   atorvastatin (LIPITOR) 20 MG tablet Take 20 mg by mouth daily.     budesonide-formoterol (SYMBICORT) 80-4.5 MCG/ACT inhaler Inhale 2 puffs into the lungs in the morning and at bedtime. 1 each 0   buPROPion (WELLBUTRIN XL) 300 MG 24 hr tablet Take 1 tablet (300 mg total) by mouth daily. 30 tablet 0   cholecalciferol (VITAMIN D3) 25 MCG (1000 UNIT) tablet Take 2 tablets (2,000 Units total) by mouth daily. Takes 2 tablets (1,000 units each) for a total of 2,000 units daily     docusate sodium (COLACE) 100 MG capsule Take 1 capsule (100 mg total) by mouth 2 (two) times daily. 60 capsule 0   donepezil (ARICEPT) 5 MG tablet Take 1 tablet (5 mg total) by mouth daily. 90 tablet 3   esomeprazole (NEXIUM) 40 MG capsule Take 1 capsule (40 mg total) by mouth daily. 30 capsule 0   metoprolol succinate (TOPROL-XL) 50 MG 24 hr tablet Take 50 mg by mouth daily. Take with or immediately following a meal.     polyethylene glycol powder (GLYCOLAX/MIRALAX) 17 GM/SCOOP powder Take 1 capful of powder mixed in drink by mouth 1 to 3 times daily Until daily soft stools  OTC 225 g 0   sertraline (ZOLOFT) 25 MG tablet Take 25 mg by mouth daily.     traZODone (DESYREL) 100 MG tablet Take 1 tablet (100 mg total) by mouth at bedtime. 10 tablet 0   valsartan (DIOVAN) 320 MG tablet Take 1 tablet (320 mg total) by mouth daily. 30 tablet 0   No current facility-administered medications for this visit.     ASSESSMENT & PLAN:   Assessment:   1. Chronic lymphocytic leukemia, diagnosed in October 2019. His leukocytosis/lymphocytosis has fluctuated up and down over the years, but has increased significantly within the last 6 months and is associated with mild splenomegaly. He does not have significant lymphadenopathy or cytopenias, so we will continue observation. He has had wide fluctuations in his white blood count before.  We will have him return in a month for  close follow up.  2.  Worsening memory.  MRI head from August 2021 revealed no acute intracranial abnormality.  However, there was advanced atrophy for age, but no specific lobar predilection.  3.  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 He remains on aspirin.  4.  Mild splenomegaly, but he does have symptoms of early satiety.  Plan: As he remains asymptomatic, we will plan to see him back in 1 month with a CBC and comprehensive metabolic panel for close follow up. If his blood counts stabilize, we can go back to 3 month follow up. The patient and his wife understand the plans discussed today and are in agreement with them. They know to contact our office if he develops concerns prior to his next appointment.   I provided 15 minutes of face-to-face time during this this encounter and > 50% was spent counseling as documented under my assessment and plan.    Derwood Kaplan, MD [facility] [address] [place] [address] Dept: [phone] Dept Fax: [phone]   I, Rita Ohara, am acting as scribe for Derwood Kaplan, MD  I have reviewed this report as typed by the medical scribe, and it is complete and accurate.  Hermina Barters

## 2021-04-23 ENCOUNTER — Encounter: Payer: Self-pay | Admitting: Oncology

## 2021-04-23 ENCOUNTER — Telehealth: Payer: Self-pay | Admitting: Oncology

## 2021-04-23 ENCOUNTER — Other Ambulatory Visit: Payer: Self-pay | Admitting: Oncology

## 2021-04-23 ENCOUNTER — Inpatient Hospital Stay (INDEPENDENT_AMBULATORY_CARE_PROVIDER_SITE_OTHER): Payer: Medicare PPO | Admitting: Oncology

## 2021-04-23 ENCOUNTER — Inpatient Hospital Stay: Payer: Medicare PPO | Attending: Oncology

## 2021-04-23 VITALS — BP 147/79 | HR 55 | Temp 98.3°F | Resp 18 | Ht 72.0 in | Wt 202.6 lb

## 2021-04-23 DIAGNOSIS — C9110 Chronic lymphocytic leukemia of B-cell type not having achieved remission: Secondary | ICD-10-CM

## 2021-04-23 LAB — CBC AND DIFFERENTIAL
HCT: 42 (ref 41–53)
Hemoglobin: 13.5 (ref 13.5–17.5)
Neutrophils Absolute: 5.36
Platelets: 164 (ref 150–399)
WBC: 67

## 2021-04-23 LAB — CBC: RBC: 4.18 (ref 3.87–5.11)

## 2021-04-23 NOTE — Telephone Encounter (Signed)
Per 9/19 LOS, patient scheduled for Oct Appt's.  Gave patient Appt Calendar

## 2021-05-09 DIAGNOSIS — R197 Diarrhea, unspecified: Secondary | ICD-10-CM | POA: Diagnosis not present

## 2021-05-17 NOTE — Progress Notes (Signed)
[place]  [address] [place],    [address] [phone]  Clinic Day:  05/23/2021  Referring physician: Nicoletta Dress, MD  This document serves as a record of services personally performed by Hosie Poisson, MD. It was created on their behalf by [facility] E, a trained medical scribe. The creation of this record is based on the scribe's personal observations and the provider's statements to them.  CHIEF COMPLAINT:  CC: Chronic lymphocytic leukemia  Current Treatment:  Observation   HISTORY OF PRESENT ILLNESS:  Jonathon Parker is a 70 y.o. male with chronic lymphocytic leukemia diagnosed in October 2019. He was referred for leukocytosis/lymphocytosis.  His only complaint was fatigue, as well as intermittent adenopathy of his neck.  There was no anemia, thrombocytopenia or splenomegaly.  The white count was 14,000 with 22% neutrophils and 74% lymphocytes.  Hemoglobin was normal at 14.1 and platelet count was 158,000.  Flow cytometry confirmed the diagnosis of chronic lymphocytic leukemia.  In January 2020, the white count had jumped from 17 to 45.6 with 90% lymphocytes, but the hemoglobin and platelet count remained normal.  His absolute lymphocyte count had increased from 14,000 to 38,000. This was felt to be due to acute bronchitis he had at the time.  He was seen again in March and the white count had improved to 26.3, but still 90% lymphocytes.  By June of 2020, his white count was down to 15.6 with 56% lymphocytes.  In October, his white count improved to 10.5 with 65.5% lymphocytes.  He underwent a cholecystectomy on September 15th.  Post op he ended up with an abscess and required drains.    In March 2021, white count was 14.2 with 81% lymphocytes. By August, his white count had increased to 22.8 with 81% lymphocytes. Then in October, white count was 25.7 with 87% lymphocytes. Head MRI from August 2021 revealed no acute  intracranial abnormality, but did show dvanced atrophy for age. No specific lobar predilection. He is on medications for dementia. He had a stroke in April 2022. This presented with numbness of the left side, including extremities and face. He was transferred to [facility] and given TPA with improvement. He underwent OT and PT. He continues atorvastatin 20 mg and aspirin 81 mg daily. His white count increased from 22.5 to 67.0 by September of 2022 with 94% lymphocytes. We decided to continue to monitor but to check it more frequently.  INTERVAL HISTORY:  Jonathon Parker is here for routine follow up and states that he has been severely fatigued. He also notes abdominal distention and early satiety. White count has increased from 67.0 to 71.5, hemoglobin is stable at 13.4, and platelet count is normal. Differential revealed 90.5% lymphocytes, 7.5% neutrophils, and 1.6% monocytes, with an [place] of 5720. Chemistries are unremarkable. His  appetite is good, and he has gained 3 pounds since his last visit.  He denies fever, chills or other signs of infection.  He denies nausea, vomiting, bowel issues, or abdominal pain.  He denies sore throat, cough, dyspnea, or chest pain.  REVIEW OF SYSTEMS:  Review of Systems  Constitutional:  Positive for appetite change (early satiety) and fatigue (worsening). Negative for chills, fever and unexpected weight change.  HENT:  Negative.    Eyes: Negative.   Respiratory: Negative.  Negative for chest tightness, cough, hemoptysis, shortness of breath and wheezing.   Cardiovascular:  Negative for chest pain, leg swelling and palpitations.  Gastrointestinal:  Positive for abdominal distention. Negative  for abdominal pain, blood in stool, constipation, diarrhea, nausea and vomiting.  Endocrine: Negative.   Genitourinary: Negative.  Negative for difficulty urinating, dysuria, frequency and hematuria.   Musculoskeletal: Negative.  Negative for arthralgias, back pain, flank pain, gait problem and  myalgias.  Skin: Negative.   Neurological:  Negative for dizziness, extremity weakness, gait problem, headaches, light-headedness, numbness, seizures and speech difficulty.  Hematological: Negative.   Psychiatric/Behavioral: Negative.  Negative for depression and sleep disturbance. The patient is not nervous/anxious.   All other systems reviewed and are negative.   VITALS:  Blood pressure (!) 174/82, pulse (!) 51, temperature 97.8 F (36.6 C), temperature source Oral, resp. rate 18, height 6' (1.829 m), weight 206 lb (93.4 kg), SpO2 98 %.  Wt Readings from Last 3 Encounters:  05/23/21 206 lb (93.4 kg)  04/23/21 202 lb 9.6 oz (91.9 kg)  03/02/21 202 lb (91.6 kg)    Body mass index is 27.94 kg/m.  Performance status (ECOG): 1 - Symptomatic but completely ambulatory  PHYSICAL EXAM:  Physical Exam Constitutional:      General: He is not in acute distress.    Appearance: Normal appearance. He is normal weight.  HENT:     Head: Normocephalic and atraumatic.  Eyes:     General: No scleral icterus.    Extraocular Movements: Extraocular movements intact.     Conjunctiva/sclera: Conjunctivae normal.     Pupils: Pupils are equal, round, and reactive to light.  Cardiovascular:     Rate and Rhythm: Regular rhythm. Bradycardia present.     Pulses: Normal pulses.     Heart sounds: Normal heart sounds. No murmur heard.   No friction rub. No gallop.  Pulmonary:     Effort: Pulmonary effort is normal. No respiratory distress.     Breath sounds: Normal breath sounds.  Abdominal:     General: Bowel sounds are normal. There is no distension.     Palpations: Abdomen is soft. There is splenomegaly (measuring about 14 cm below the left costal margin). There is no hepatomegaly or mass.     Tenderness: There is no abdominal tenderness.  Musculoskeletal:        General: Normal range of motion.     Cervical back: Normal range of motion and neck supple.     Right lower leg: No edema.     Left  lower leg: No edema.  Lymphadenopathy:     Cervical: No cervical adenopathy.  Skin:    General: Skin is warm and dry.  Neurological:     General: No focal deficit present.     Mental Status: He is alert and oriented to person, place, and time. Mental status is at baseline.  Psychiatric:        Mood and Affect: Mood normal.        Behavior: Behavior normal.        Thought Content: Thought content normal.        Judgment: Judgment normal.    LABS:   CBC Latest Ref Rng & Units 05/23/2021 04/23/2021 11/24/2020  WBC - 71.5 67.0 -  Hemoglobin 13.5 - 17.5 13.4(A) 13.5 17.3(H)  Hematocrit 41 - 53 42 42 51.0  Platelets 150 - 399 163 164 -   CMP Latest Ref Rng & Units 05/23/2021 11/24/2020 11/24/2020  Glucose 70 - 99 mg/dL - 176(H) 183(H)  BUN 4 - 21 16 16 13   Creatinine 0.6 - 1.3 1.1 1.20 1.30(H)  Sodium 137 - 147 138 137 137  Potassium 3.4 -  5.3 4.7 4.6 5.9(H)  Chloride 99 - 108 104 104 104  CO2 13 - 22 31(A) - 25  Calcium 8.7 - 10.7 8.5(A) - 8.9  Total Protein 6.5 - 8.1 g/dL - - 6.5  Total Bilirubin 0.3 - 1.2 mg/dL - - 1.5(H)  Alkaline Phos 25 - 125 88 - 92  AST 14 - 40 32 - 33  ALT 10 - 40 19 - 19     No results found for: LDH   STUDIES:   Allergies:  Allergies  Allergen Reactions   Lisinopril Cough    Current Medications: Current Outpatient Medications  Medication Sig Dispense Refill   albuterol (VENTOLIN HFA) 108 (90 Base) MCG/ACT inhaler Inhale 2 puffs into the lungs every 6 (six) hours as needed for wheezing or shortness of breath. 18 g 0   amLODipine (NORVASC) 10 MG tablet Take 1 tablet (10 mg total) by mouth daily. 30 tablet 1   aspirin EC 81 MG EC tablet Take 1 tablet (81 mg total) by mouth daily. Swallow whole. 30 tablet 0   atorvastatin (LIPITOR) 20 MG tablet Take 20 mg by mouth daily.     budesonide-formoterol (SYMBICORT) 80-4.5 MCG/ACT inhaler Inhale 2 puffs into the lungs in the morning and at bedtime. 1 each 0   buPROPion (WELLBUTRIN XL) 300 MG 24 hr  tablet Take 1 tablet (300 mg total) by mouth daily. 30 tablet 0   cholecalciferol (VITAMIN D3) 25 MCG (1000 UNIT) tablet Take 2 tablets (2,000 Units total) by mouth daily. Takes 2 tablets (1,000 units each) for a total of 2,000 units daily     cholestyramine (QUESTRAN) 4 g packet Take 1 packet by mouth 2 (two) times daily.     docusate sodium (COLACE) 100 MG capsule Take 1 capsule (100 mg total) by mouth 2 (two) times daily. 60 capsule 0   donepezil (ARICEPT) 5 MG tablet Take 1 tablet (5 mg total) by mouth daily. 90 tablet 3   esomeprazole (NEXIUM) 40 MG capsule Take 1 capsule (40 mg total) by mouth daily. 30 capsule 0   metoprolol succinate (TOPROL-XL) 50 MG 24 hr tablet Take 50 mg by mouth daily. Take with or immediately following a meal.     polyethylene glycol powder (GLYCOLAX/MIRALAX) 17 GM/SCOOP powder Take 1 capful of powder mixed in drink by mouth 1 to 3 times daily Until daily soft stools  OTC 225 g 0   sertraline (ZOLOFT) 25 MG tablet Take 25 mg by mouth daily.     traZODone (DESYREL) 100 MG tablet Take 1 tablet (100 mg total) by mouth at bedtime. 10 tablet 0   valsartan (DIOVAN) 320 MG tablet Take 1 tablet (320 mg total) by mouth daily. 30 tablet 0   No current facility-administered medications for this visit.     ASSESSMENT & PLAN:   Assessment:   1. Chronic lymphocytic leukemia, diagnosed in October 2019. His leukocytosis/lymphocytosis has fluctuated up and down over the years, but has increased significantly within the last 6 months and is associated with splenomegaly. He does not have significant lymphadenopathy or cytopenias, but he is becoming symptomatic with worsening fatigue, and abdominal pain with early satiety. We reviewed possible treatment options today, but we will not rush into aggressive treatment, however, he will likely require this soon.  2.  Worsening memory.  MRI head from August 2021 revealed no acute intracranial abnormality.  However, there was advanced  atrophy for age.  3.  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 He remains on aspirin.  4.  Splenomegaly, worsening; he does have symptoms of early satiety.  5. Worsening fatigue, secondary to his CLL.  Plan: His white count has increased again now measuring 71.5 with 90.5% lymphocytes and he is becoming symptomatic with increasing fatigue, and early satiety. Potential treatment options were reviewed today including IV bendamustine/rituximab every 4 weeks for 3-6 cycles or oral chemotherapy with ibrutinib, indefinitely versus 2 years. The schedules and potential toxicities were reviewed, but we will not rush into aggressive treatment at this time. However, he will likely require treatment soon. We will plan to see him back in 3-4 weeks with a CBC and comprehensive metabolic panel for close follow up. The patient and his wife understand the plans discussed today and are in agreement with them. They know to contact our office if he develops concerns prior to his next appointment.   I provided 25 minutes of face-to-face time during this this encounter and > 50% was spent counseling as documented under my assessment and plan.    Derwood Kaplan, MD [facility] [address] [place] [address] Dept: [phone] Dept Fax: [phone]   I, Rita Ohara, am acting as scribe for Derwood Kaplan, MD  I have reviewed this report as typed by the medical scribe, and it is complete and accurate.  Hermina Barters

## 2021-05-23 ENCOUNTER — Other Ambulatory Visit: Payer: Self-pay | Admitting: Hematology and Oncology

## 2021-05-23 ENCOUNTER — Telehealth: Payer: Self-pay | Admitting: Oncology

## 2021-05-23 ENCOUNTER — Inpatient Hospital Stay: Payer: Medicare PPO | Attending: Oncology

## 2021-05-23 ENCOUNTER — Inpatient Hospital Stay: Payer: Medicare PPO | Admitting: Oncology

## 2021-05-23 ENCOUNTER — Encounter: Payer: Self-pay | Admitting: Oncology

## 2021-05-23 ENCOUNTER — Other Ambulatory Visit: Payer: Self-pay | Admitting: Oncology

## 2021-05-23 VITALS — BP 174/82 | HR 51 | Temp 97.8°F | Resp 18 | Ht 72.0 in | Wt 206.0 lb

## 2021-05-23 DIAGNOSIS — C9110 Chronic lymphocytic leukemia of B-cell type not having achieved remission: Secondary | ICD-10-CM | POA: Diagnosis not present

## 2021-05-23 LAB — CBC AND DIFFERENTIAL
HCT: 42 (ref 41–53)
Hemoglobin: 13.4 — AB (ref 13.5–17.5)
Neutrophils Absolute: 64.35
Platelets: 163 (ref 150–399)
WBC: 71.5

## 2021-05-23 LAB — BASIC METABOLIC PANEL
BUN: 16 (ref 4–21)
CO2: 31 — AB (ref 13–22)
Chloride: 104 (ref 99–108)
Creatinine: 1.1 (ref 0.6–1.3)
Glucose: 94
Potassium: 4.7 (ref 3.4–5.3)
Sodium: 138 (ref 137–147)

## 2021-05-23 LAB — HEPATIC FUNCTION PANEL
ALT: 19 (ref 10–40)
AST: 32 (ref 14–40)
Alkaline Phosphatase: 88 (ref 25–125)
Bilirubin, Total: 0.9

## 2021-05-23 LAB — COMPREHENSIVE METABOLIC PANEL
Albumin: 3.9 (ref 3.5–5.0)
Calcium: 8.5 — AB (ref 8.7–10.7)

## 2021-05-23 LAB — CBC: RBC: 4.24 (ref 3.87–5.11)

## 2021-05-23 NOTE — Telephone Encounter (Signed)
Per 10/19 los next appt scheduled and given to patient 

## 2021-05-30 DIAGNOSIS — I10 Essential (primary) hypertension: Secondary | ICD-10-CM | POA: Diagnosis not present

## 2021-05-30 DIAGNOSIS — C9110 Chronic lymphocytic leukemia of B-cell type not having achieved remission: Secondary | ICD-10-CM | POA: Diagnosis not present

## 2021-05-30 DIAGNOSIS — Z23 Encounter for immunization: Secondary | ICD-10-CM | POA: Diagnosis not present

## 2021-06-06 NOTE — Progress Notes (Incomplete)
[facility]  [address] [place],  [place]  [address] [phone]  Clinic Day:  06/06/2021  Referring physician: Nicoletta Dress, MD  This document serves as a record of services personally performed by Hosie Poisson, MD. It was created on their behalf by [facility] E, a trained medical scribe. The creation of this record is based on the scribe's personal observations and the provider's statements to them.  CHIEF COMPLAINT:  CC: Chronic lymphocytic leukemia  Current Treatment:  Observation   HISTORY OF PRESENT ILLNESS:  Mukund Weinreb is a 70 y.o. male with chronic lymphocytic leukemia diagnosed in October 2019. He was referred for leukocytosis/lymphocytosis.  His only complaint was fatigue, as well as intermittent adenopathy of his neck.  There was no anemia, thrombocytopenia or splenomegaly.  The white count was 14,000 with 22% neutrophils and 74% lymphocytes.  Hemoglobin was normal at 14.1 and platelet count was 158,000.  Flow cytometry confirmed the diagnosis of chronic lymphocytic leukemia.  In January 2020, the white count had jumped from 17 to 45.6 with 90% lymphocytes, but the hemoglobin and platelet count remained normal.  His absolute lymphocyte count had increased from 14,000 to 38,000. This was felt to be due to acute bronchitis he had at the time.  He was seen again in March and the white count had improved to 26.3, but still 90% lymphocytes.  By June of 2020, his white count was down to 15.6 with 56% lymphocytes.  In October, his white count improved to 10.5 with 65.5% lymphocytes.  He underwent a cholecystectomy on September 15th.  Post op he ended up with an abscess and required drains.    In March 2021, white count was 14.2 with 81% lymphocytes. By August, his white count had increased to 22.8 with 81% lymphocytes. Then in October, white count was 25.7 with 87% lymphocytes. Head MRI from August 2021 revealed no acute  intracranial abnormality, but did show dvanced atrophy for age. No specific lobar predilection. He is on medications for dementia. He had a stroke in April 2022. This presented with numbness of the left side, including extremities and face. He was transferred to [facility] and given TPA with improvement. He underwent OT and PT. He continues atorvastatin 20 mg and aspirin 81 mg daily. His white count increased from 22.5 to 67.0 by September of 2022 with 94% lymphocytes. We decided to continue to monitor but to check it more frequently.  INTERVAL HISTORY:  Ronalee Belts is here for routine follow up and states that he has been severely fatigued. He also notes abdominal distention and early satiety. White count has increased from 67.0 to 71.5, hemoglobin is stable at 13.4, and platelet count is normal. Differential revealed 90.5% lymphocytes, 7.5% neutrophils, and 1.6% monocytes, with an Maribel of 5720. Chemistries are unremarkable. His  appetite is good, and he has gained 3 pounds since his last visit.  He denies fever, chills or other signs of infection.  He denies nausea, vomiting, bowel issues, or abdominal pain.  He denies sore throat, cough, dyspnea, or chest pain.  Ronalee Belts is here for routine follow up ***.   His  appetite is good, and he has gained/lost _ pounds since his last visit.  He denies fever, chills or other signs of infection.  He denies nausea, vomiting, bowel issues, or abdominal pain.  He denies sore throat, cough, dyspnea, or chest pain.  REVIEW OF SYSTEMS:  Review of Systems  Constitutional: Negative.  Negative for appetite change, chills,  fatigue, fever and unexpected weight change.  HENT:  Negative.    Eyes: Negative.   Respiratory: Negative.  Negative for chest tightness, cough, hemoptysis, shortness of breath and wheezing.   Cardiovascular: Negative.  Negative for chest pain, leg swelling and palpitations.  Gastrointestinal: Negative.  Negative for abdominal distention, abdominal pain, blood in  stool, constipation, diarrhea, nausea and vomiting.  Endocrine: Negative.   Genitourinary: Negative.  Negative for difficulty urinating, dysuria, frequency and hematuria.   Musculoskeletal: Negative.  Negative for arthralgias, back pain, flank pain, gait problem and myalgias.  Skin: Negative.   Neurological: Negative.  Negative for dizziness, extremity weakness, gait problem, headaches, light-headedness, numbness, seizures and speech difficulty.  Hematological: Negative.   Psychiatric/Behavioral: Negative.  Negative for depression and sleep disturbance. The patient is not nervous/anxious.   All other systems reviewed and are negative.   VITALS:  There were no vitals taken for this visit.  Wt Readings from Last 3 Encounters:  05/23/21 206 lb (93.4 kg)  04/23/21 202 lb 9.6 oz (91.9 kg)  03/02/21 202 lb (91.6 kg)    There is no height or weight on file to calculate BMI.  Performance status (ECOG): 1 - Symptomatic but completely ambulatory  PHYSICAL EXAM:  Physical Exam Constitutional:      General: He is not in acute distress.    Appearance: Normal appearance. He is normal weight.  HENT:     Head: Normocephalic and atraumatic.  Eyes:     General: No scleral icterus.    Extraocular Movements: Extraocular movements intact.     Conjunctiva/sclera: Conjunctivae normal.     Pupils: Pupils are equal, round, and reactive to light.  Cardiovascular:     Rate and Rhythm: Normal rate and regular rhythm.     Pulses: Normal pulses.     Heart sounds: Normal heart sounds. No murmur heard.   No friction rub. No gallop.  Pulmonary:     Effort: Pulmonary effort is normal. No respiratory distress.     Breath sounds: Normal breath sounds.  Abdominal:     General: Bowel sounds are normal. There is no distension.     Palpations: Abdomen is soft. There is no hepatomegaly, splenomegaly or mass.     Tenderness: There is no abdominal tenderness.  Musculoskeletal:        General: Normal range of  motion.     Cervical back: Normal range of motion and neck supple.     Right lower leg: No edema.     Left lower leg: No edema.  Lymphadenopathy:     Cervical: No cervical adenopathy.  Skin:    General: Skin is warm and dry.  Neurological:     General: No focal deficit present.     Mental Status: He is alert and oriented to person, place, and time. Mental status is at baseline.  Psychiatric:        Mood and Affect: Mood normal.        Behavior: Behavior normal.        Thought Content: Thought content normal.        Judgment: Judgment normal.    LABS:   CBC Latest Ref Rng & Units 05/23/2021 04/23/2021 11/24/2020  WBC - 71.5 67.0 -  Hemoglobin 13.5 - 17.5 13.4(A) 13.5 17.3(H)  Hematocrit 41 - 53 42 42 51.0  Platelets 150 - 399 163 164 -   CMP Latest Ref Rng & Units 05/23/2021 11/24/2020 11/24/2020  Glucose 70 - 99 mg/dL - 176(H) 183(H)  BUN  4 - 21 16 16 13   Creatinine 0.6 - 1.3 1.1 1.20 1.30(H)  Sodium 137 - 147 138 137 137  Potassium 3.4 - 5.3 4.7 4.6 5.9(H)  Chloride 99 - 108 104 104 104  CO2 13 - 22 31(A) - 25  Calcium 8.7 - 10.7 8.5(A) - 8.9  Total Protein 6.5 - 8.1 g/dL - - 6.5  Total Bilirubin 0.3 - 1.2 mg/dL - - 1.5(H)  Alkaline Phos 25 - 125 88 - 92  AST 14 - 40 32 - 33  ALT 10 - 40 19 - 19     No results found for: LDH   STUDIES:   Allergies:  Allergies  Allergen Reactions   Lisinopril Cough    Current Medications: Current Outpatient Medications  Medication Sig Dispense Refill   albuterol (VENTOLIN HFA) 108 (90 Base) MCG/ACT inhaler Inhale 2 puffs into the lungs every 6 (six) hours as needed for wheezing or shortness of breath. 18 g 0   amLODipine (NORVASC) 10 MG tablet Take 1 tablet (10 mg total) by mouth daily. 30 tablet 1   aspirin EC 81 MG EC tablet Take 1 tablet (81 mg total) by mouth daily. Swallow whole. 30 tablet 0   atorvastatin (LIPITOR) 20 MG tablet Take 20 mg by mouth daily.     budesonide-formoterol (SYMBICORT) 80-4.5 MCG/ACT inhaler Inhale  2 puffs into the lungs in the morning and at bedtime. 1 each 0   buPROPion (WELLBUTRIN XL) 300 MG 24 hr tablet Take 1 tablet (300 mg total) by mouth daily. 30 tablet 0   cholecalciferol (VITAMIN D3) 25 MCG (1000 UNIT) tablet Take 2 tablets (2,000 Units total) by mouth daily. Takes 2 tablets (1,000 units each) for a total of 2,000 units daily     cholestyramine (QUESTRAN) 4 g packet Take 1 packet by mouth 2 (two) times daily.     docusate sodium (COLACE) 100 MG capsule Take 1 capsule (100 mg total) by mouth 2 (two) times daily. 60 capsule 0   donepezil (ARICEPT) 5 MG tablet Take 1 tablet (5 mg total) by mouth daily. 90 tablet 3   esomeprazole (NEXIUM) 40 MG capsule Take 1 capsule (40 mg total) by mouth daily. 30 capsule 0   metoprolol succinate (TOPROL-XL) 50 MG 24 hr tablet Take 50 mg by mouth daily. Take with or immediately following a meal.     polyethylene glycol powder (GLYCOLAX/MIRALAX) 17 GM/SCOOP powder Take 1 capful of powder mixed in drink by mouth 1 to 3 times daily Until daily soft stools  OTC 225 g 0   sertraline (ZOLOFT) 25 MG tablet Take 25 mg by mouth daily.     traZODone (DESYREL) 100 MG tablet Take 1 tablet (100 mg total) by mouth at bedtime. 10 tablet 0   valsartan (DIOVAN) 320 MG tablet Take 1 tablet (320 mg total) by mouth daily. 30 tablet 0   No current facility-administered medications for this visit.     ASSESSMENT & PLAN:   Assessment:   1. Chronic lymphocytic leukemia, diagnosed in October 2019. His leukocytosis/lymphocytosis has fluctuated up and down over the years, but has increased significantly within the last 6 months and is associated with splenomegaly. He does not have significant lymphadenopathy or cytopenias, but he is becoming symptomatic with worsening fatigue, and abdominal pain with early satiety. We reviewed possible treatment options today, but we will not rush into aggressive treatment, however, he will likely require this soon.  2.  Worsening memory.   MRI head from August  2021 revealed no acute intracranial abnormality.  However, there was advanced atrophy for age.  3.  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 He remains on aspirin.  4.  Splenomegaly, worsening; he does have symptoms of early satiety.  5. Worsening fatigue, secondary to his CLL.  Plan: His white count has increased again now measuring 71.5 with 90.5% lymphocytes and he is becoming symptomatic with increasing fatigue, and early satiety. Potential treatment options were reviewed today including IV bendamustine/rituximab every 4 weeks for 3-6 cycles or oral chemotherapy with ibrutinib, indefinitely versus 2 years. The schedules and potential toxicities were reviewed, but we will not rush into aggressive treatment at this time. However, he will likely require treatment soon. We will plan to see him back in 3-4 weeks with a CBC and comprehensive metabolic panel for close follow up. The patient and his wife understand the plans discussed today and are in agreement with them. They know to contact our office if he develops concerns prior to his next appointment.   I provided 25 minutes of face-to-face time during this this encounter and > 50% was spent counseling as documented under my assessment and plan.    Derwood Kaplan, MD [facility] AT [facility] [address] [place] [address] Dept: [phone] Dept Fax: [phone]   I, Rita Ohara, am acting as scribe for Derwood Kaplan, MD  I have reviewed this report as typed by the medical scribe, and it is complete and accurate.  Hermina Barters

## 2021-06-07 DIAGNOSIS — C9110 Chronic lymphocytic leukemia of B-cell type not having achieved remission: Secondary | ICD-10-CM | POA: Diagnosis not present

## 2021-06-13 ENCOUNTER — Other Ambulatory Visit: Payer: Medicare PPO

## 2021-06-13 ENCOUNTER — Ambulatory Visit: Payer: Medicare PPO | Admitting: Oncology

## 2021-06-13 DIAGNOSIS — C9110 Chronic lymphocytic leukemia of B-cell type not having achieved remission: Secondary | ICD-10-CM | POA: Diagnosis not present

## 2021-06-19 DIAGNOSIS — C9110 Chronic lymphocytic leukemia of B-cell type not having achieved remission: Secondary | ICD-10-CM | POA: Diagnosis not present

## 2021-06-26 ENCOUNTER — Encounter: Payer: Self-pay | Admitting: Internal Medicine

## 2021-06-27 ENCOUNTER — Ambulatory Visit: Payer: Medicare PPO | Admitting: Gastroenterology

## 2021-07-02 DIAGNOSIS — H25813 Combined forms of age-related cataract, bilateral: Secondary | ICD-10-CM | POA: Diagnosis not present

## 2021-07-25 DIAGNOSIS — C9110 Chronic lymphocytic leukemia of B-cell type not having achieved remission: Secondary | ICD-10-CM | POA: Diagnosis not present

## 2021-08-13 DIAGNOSIS — C9110 Chronic lymphocytic leukemia of B-cell type not having achieved remission: Secondary | ICD-10-CM | POA: Diagnosis not present

## 2021-08-15 DIAGNOSIS — C9110 Chronic lymphocytic leukemia of B-cell type not having achieved remission: Secondary | ICD-10-CM | POA: Diagnosis not present

## 2021-08-16 DIAGNOSIS — C9110 Chronic lymphocytic leukemia of B-cell type not having achieved remission: Secondary | ICD-10-CM | POA: Diagnosis not present

## 2021-08-30 DIAGNOSIS — J4520 Mild intermittent asthma, uncomplicated: Secondary | ICD-10-CM | POA: Diagnosis not present

## 2021-08-30 DIAGNOSIS — R413 Other amnesia: Secondary | ICD-10-CM | POA: Diagnosis not present

## 2021-08-30 DIAGNOSIS — E785 Hyperlipidemia, unspecified: Secondary | ICD-10-CM | POA: Diagnosis not present

## 2021-08-30 DIAGNOSIS — E559 Vitamin D deficiency, unspecified: Secondary | ICD-10-CM | POA: Diagnosis not present

## 2021-08-30 DIAGNOSIS — I10 Essential (primary) hypertension: Secondary | ICD-10-CM | POA: Diagnosis not present

## 2021-08-30 DIAGNOSIS — K219 Gastro-esophageal reflux disease without esophagitis: Secondary | ICD-10-CM | POA: Diagnosis not present

## 2021-08-30 DIAGNOSIS — F5104 Psychophysiologic insomnia: Secondary | ICD-10-CM | POA: Diagnosis not present

## 2021-08-30 DIAGNOSIS — F331 Major depressive disorder, recurrent, moderate: Secondary | ICD-10-CM | POA: Diagnosis not present

## 2021-09-06 DIAGNOSIS — C9110 Chronic lymphocytic leukemia of B-cell type not having achieved remission: Secondary | ICD-10-CM | POA: Diagnosis not present

## 2021-09-17 DIAGNOSIS — J208 Acute bronchitis due to other specified organisms: Secondary | ICD-10-CM | POA: Diagnosis not present

## 2021-09-17 DIAGNOSIS — J019 Acute sinusitis, unspecified: Secondary | ICD-10-CM | POA: Diagnosis not present

## 2021-09-17 DIAGNOSIS — B9689 Other specified bacterial agents as the cause of diseases classified elsewhere: Secondary | ICD-10-CM | POA: Diagnosis not present

## 2021-09-21 ENCOUNTER — Ambulatory Visit: Payer: Medicare PPO | Admitting: Neurology

## 2021-10-31 DIAGNOSIS — C9110 Chronic lymphocytic leukemia of B-cell type not having achieved remission: Secondary | ICD-10-CM | POA: Diagnosis not present

## 2021-10-31 DIAGNOSIS — R109 Unspecified abdominal pain: Secondary | ICD-10-CM | POA: Diagnosis not present

## 2021-10-31 DIAGNOSIS — R110 Nausea: Secondary | ICD-10-CM | POA: Diagnosis not present

## 2021-10-31 DIAGNOSIS — E860 Dehydration: Secondary | ICD-10-CM | POA: Diagnosis not present

## 2021-10-31 DIAGNOSIS — R197 Diarrhea, unspecified: Secondary | ICD-10-CM | POA: Diagnosis not present

## 2021-10-31 DIAGNOSIS — I499 Cardiac arrhythmia, unspecified: Secondary | ICD-10-CM | POA: Diagnosis not present

## 2021-10-31 DIAGNOSIS — R1084 Generalized abdominal pain: Secondary | ICD-10-CM | POA: Diagnosis not present

## 2021-11-01 DIAGNOSIS — R1084 Generalized abdominal pain: Secondary | ICD-10-CM | POA: Diagnosis not present

## 2021-11-01 DIAGNOSIS — C9110 Chronic lymphocytic leukemia of B-cell type not having achieved remission: Secondary | ICD-10-CM | POA: Diagnosis not present

## 2021-11-01 DIAGNOSIS — R197 Diarrhea, unspecified: Secondary | ICD-10-CM | POA: Diagnosis not present

## 2021-11-01 DIAGNOSIS — I499 Cardiac arrhythmia, unspecified: Secondary | ICD-10-CM | POA: Diagnosis not present

## 2021-11-01 DIAGNOSIS — R110 Nausea: Secondary | ICD-10-CM | POA: Diagnosis not present

## 2021-11-01 DIAGNOSIS — E860 Dehydration: Secondary | ICD-10-CM | POA: Diagnosis not present

## 2021-11-04 ENCOUNTER — Other Ambulatory Visit: Payer: Self-pay

## 2021-11-04 ENCOUNTER — Encounter (HOSPITAL_COMMUNITY): Payer: Self-pay | Admitting: Emergency Medicine

## 2021-11-04 ENCOUNTER — Emergency Department (HOSPITAL_COMMUNITY)
Admission: EM | Admit: 2021-11-04 | Discharge: 2021-11-04 | Disposition: A | Discharge status: Home / Self Care | Payer: Medicare PPO | Attending: Emergency Medicine | Admitting: Emergency Medicine

## 2021-11-04 DIAGNOSIS — R112 Nausea with vomiting, unspecified: Secondary | ICD-10-CM | POA: Diagnosis not present

## 2021-11-04 DIAGNOSIS — R531 Weakness: Secondary | ICD-10-CM | POA: Diagnosis not present

## 2021-11-04 DIAGNOSIS — Z7982 Long term (current) use of aspirin: Secondary | ICD-10-CM | POA: Insufficient documentation

## 2021-11-04 DIAGNOSIS — R197 Diarrhea, unspecified: Secondary | ICD-10-CM | POA: Diagnosis not present

## 2021-11-04 DIAGNOSIS — E860 Dehydration: Secondary | ICD-10-CM | POA: Diagnosis not present

## 2021-11-04 DIAGNOSIS — C9110 Chronic lymphocytic leukemia of B-cell type not having achieved remission: Secondary | ICD-10-CM | POA: Insufficient documentation

## 2021-11-04 DIAGNOSIS — R110 Nausea: Secondary | ICD-10-CM | POA: Diagnosis not present

## 2021-11-04 DIAGNOSIS — R109 Unspecified abdominal pain: Secondary | ICD-10-CM | POA: Diagnosis not present

## 2021-11-04 DIAGNOSIS — R1111 Vomiting without nausea: Secondary | ICD-10-CM | POA: Diagnosis not present

## 2021-11-04 LAB — COMPREHENSIVE METABOLIC PANEL
ALT: 43 U/L (ref 0–44)
AST: 22 U/L (ref 15–41)
Albumin: 3.9 g/dL (ref 3.5–5.0)
Alkaline Phosphatase: 134 U/L — ABNORMAL HIGH (ref 38–126)
Anion gap: 10 (ref 5–15)
BUN: 10 mg/dL (ref 8–23)
CO2: 23 mmol/L (ref 22–32)
Calcium: 9.1 mg/dL (ref 8.9–10.3)
Chloride: 106 mmol/L (ref 98–111)
Creatinine, Ser: 1.29 mg/dL — ABNORMAL HIGH (ref 0.61–1.24)
GFR, Estimated: 60 mL/min — ABNORMAL LOW (ref >60)
Glucose, Bld: 124 mg/dL — ABNORMAL HIGH (ref 70–99)
Potassium: 3.2 mmol/L — ABNORMAL LOW (ref 3.5–5.1)
Sodium: 139 mmol/L (ref 135–145)
Total Bilirubin: 1 mg/dL (ref 0.3–1.2)
Total Protein: 6.8 g/dL (ref 6.5–8.1)

## 2021-11-04 LAB — CBC
HCT: 42.8 % (ref 39.0–52.0)
Hemoglobin: 15.2 g/dL (ref 13.0–17.0)
MCH: 33.9 pg (ref 26.0–34.0)
MCHC: 35.5 g/dL (ref 30.0–36.0)
MCV: 95.3 fL (ref 80.0–100.0)
Platelets: 149 10*3/uL — ABNORMAL LOW (ref 150–400)
RBC: 4.49 MIL/uL (ref 4.22–5.81)
RDW: 13.3 % (ref 11.5–15.5)
WBC: 4.8 10*3/uL (ref 4.0–10.5)
nRBC: 0 % (ref 0.0–0.2)

## 2021-11-04 LAB — LIPASE, BLOOD: Lipase: 31 U/L (ref 11–51)

## 2021-11-04 MED ORDER — LACTATED RINGERS IV BOLUS
1000.0000 mL | Freq: Once | INTRAVENOUS | Status: AC
  Administered 2021-11-04: 1000 mL via INTRAVENOUS

## 2021-11-04 MED ORDER — POTASSIUM CHLORIDE CRYS ER 20 MEQ PO TBCR
20.0000 meq | EXTENDED_RELEASE_TABLET | Freq: Once | ORAL | Status: AC
Start: 2021-11-04 — End: 2021-11-04
  Administered 2021-11-04: 20 meq via ORAL
  Filled 2021-11-04: qty 1

## 2021-11-04 NOTE — ED Triage Notes (Signed)
Pt to triage via [facility] from home.  C/o nausea, vomiting, and diarrhea since Wednesday.  Pt is a CLL patient that is in a study in [place] and is receiving chemo.  Last Chemo on 3/5.  States he had IV fluids this past week.  Initially had similar problems when he was first put on chemo.  Generalized abd pain.   ? ? ?BP 116/72 ?CBG 136 ?HR 80s ? ?20g IV LAC ?Zofran '4mg'$  IV ? ? ?

## 2021-11-04 NOTE — ED Provider Notes (Signed)
?[facility] ?Provider Note ? ? ?CSN: [id number] ?Arrival date & time: 11/04/21  1039 ? ?  ? ?History ? ?Chief Complaint  ?Patient presents with  ? Abdominal Pain  ? Vomiting  ? CHEMO PT  ? ? ?Finnlee Silvernail is a 71 y.o. male.  He has a history of CLL and is on oral and IV chemotherapy experimental from [place].  He has been having ongoing nausea vomiting and diarrhea for a few weeks.  They stopped his chemotherapy about 4 days ago due to his symptoms.  He continues to have nausea vomiting and diarrhea multiple episodes a day.  Abdomen is sore from vomiting.  No blood from above or below.  No known fever.  Feels generally weak.  Has not fallen but is having difficulty getting to the bathroom ? ?The history is provided by the patient.  ?Emesis ?Severity:  Moderate ?Duration:  2 weeks ?Timing:  Intermittent ?Quality:  Stomach contents ?Progression:  Unchanged ?Chronicity:  New ?Relieved by:  Nothing ?Worsened by:  Nothing ?Ineffective treatments:  Antiemetics ?Associated symptoms: abdominal pain and diarrhea   ?Associated symptoms: no cough, no fever, no headaches and no sore throat   ? ?  ? ?Home Medications ?Prior to Admission medications   ?Medication Sig Start Date End Date Taking? Authorizing Provider  ?albuterol (VENTOLIN HFA) 108 (90 Base) MCG/ACT inhaler Inhale 2 puffs into the lungs every 6 (six) hours as needed for wheezing or shortness of breath. 11/26/20   August Albino, NP  ?amLODipine (NORVASC) 10 MG tablet Take 1 tablet (10 mg total) by mouth daily. 11/26/20   August Albino, NP  ?aspirin EC 81 MG EC tablet Take 1 tablet (81 mg total) by mouth daily. Swallow whole. 11/27/20   August Albino, NP  ?atorvastatin (LIPITOR) 20 MG tablet Take 20 mg by mouth daily. 03/22/21   [provider]  ?budesonide-formoterol (SYMBICORT) 80-4.5 MCG/ACT inhaler Inhale 2 puffs into the lungs in the morning and at bedtime. 11/26/20   August Albino, NP  ?buPROPion  (WELLBUTRIN XL) 300 MG 24 hr tablet Take 1 tablet (300 mg total) by mouth daily. 11/26/20   August Albino, NP  ?cholecalciferol (VITAMIN D3) 25 MCG (1000 UNIT) tablet Take 2 tablets (2,000 Units total) by mouth daily. Takes 2 tablets (1,000 units each) for a total of 2,000 units daily 11/26/20   August Albino, NP  ?cholestyramine (QUESTRAN) 4 g packet Take 1 packet by mouth 2 (two) times daily. 05/09/21   [provider]  ?docusate sodium (COLACE) 100 MG capsule Take 1 capsule (100 mg total) by mouth 2 (two) times daily. 11/26/20   August Albino, NP  ?donepezil (ARICEPT) 5 MG tablet Take 1 tablet (5 mg total) by mouth daily. 03/02/21   Cameron Sprang, MD  ?esomeprazole (NEXIUM) 40 MG capsule Take 1 capsule (40 mg total) by mouth daily. 11/26/20   August Albino, NP  ?metoprolol succinate (TOPROL-XL) 50 MG 24 hr tablet Take 50 mg by mouth daily. Take with or immediately following a meal.    [provider]  ?polyethylene glycol powder (GLYCOLAX/MIRALAX) 17 GM/SCOOP powder Take 1 capful of powder mixed in drink by mouth 1 to 3 times daily Until daily soft stools ? ?OTC 11/26/20   August Albino, NP  ?sertraline (ZOLOFT) 25 MG tablet Take 25 mg by mouth daily. 02/27/21   [provider]  ?traZODone (DESYREL) 100 MG tablet Take 1 tablet (100 mg  total) by mouth at bedtime. 11/26/20   August Albino, NP  ?valsartan (DIOVAN) 320 MG tablet Take 1 tablet (320 mg total) by mouth daily. 11/26/20   August Albino, NP  ?   ? ?Allergies    ?Lisinopril   ? ?Review of Systems   ?Review of Systems  ?Constitutional:  Negative for fever.  ?HENT:  Negative for sore throat.   ?Eyes:  Negative for visual disturbance.  ?Respiratory:  Negative for cough.   ?Gastrointestinal:  Positive for abdominal pain, diarrhea and vomiting.  ?Genitourinary:  Negative for dysuria.  ?Musculoskeletal:  Negative for neck pain.  ?Skin:  Negative for rash.  ?Neurological:  Negative for headaches.  ? ?Physical Exam ?Updated Vital  Signs ?BP 105/89   Pulse 99   Temp 98.6 ?F (37 ?C) (Oral)   Resp 18   SpO2 96%  ?Physical Exam ?Vitals and nursing note reviewed.  ?Constitutional:   ?   General: He is not in acute distress. ?   Appearance: He is well-developed.  ?HENT:  ?   Head: Normocephalic and atraumatic.  ?Eyes:  ?   Conjunctiva/sclera: Conjunctivae normal.  ?Cardiovascular:  ?   Rate and Rhythm: Normal rate and regular rhythm.  ?   Heart sounds: No murmur heard. ?Pulmonary:  ?   Effort: Pulmonary effort is normal. No respiratory distress.  ?   Breath sounds: Normal breath sounds.  ?Abdominal:  ?   Palpations: Abdomen is soft.  ?   Tenderness: There is no abdominal tenderness. There is no guarding or rebound.  ?Musculoskeletal:     ?   General: No swelling.  ?   Cervical back: Neck supple.  ?Skin: ?   General: Skin is warm and dry.  ?   Capillary Refill: Capillary refill takes less than 2 seconds.  ?Neurological:  ?   General: No focal deficit present.  ?   Mental Status: He is alert.  ? ? ?ED Results / Procedures / Treatments   ?Labs ?(all labs ordered are listed, but only abnormal results are displayed) ?Labs Reviewed  ?COMPREHENSIVE METABOLIC PANEL - Abnormal; Notable for the following components:  ?    Result Value  ? Potassium 3.2 (*)   ? Glucose, Bld 124 (*)   ? Creatinine, Ser 1.29 (*)   ? Alkaline Phosphatase 134 (*)   ? GFR, Estimated 60 (*)   ? All other components within normal limits  ?CBC - Abnormal; Notable for the following components:  ? Platelets 149 (*)   ? All other components within normal limits  ?GASTROINTESTINAL PANEL BY PCR, STOOL (REPLACES STOOL CULTURE)  ?C DIFFICILE QUICK SCREEN W PCR REFLEX    ?LIPASE, BLOOD  ?URINALYSIS, ROUTINE W REFLEX MICROSCOPIC  ?MAGNESIUM  ? ? ?EKG ?None ? ?Radiology ?No results found. ? ?Procedures ?Procedures  ? ? ?Medications Ordered in ED ?Medications  ?lactated ringers bolus 1,000 mL (0 mLs Intravenous Stopped 11/04/21 1515)  ?potassium chloride SA (KLOR-CON M) CR tablet 20 mEq (20  mEq Oral Given 11/04/21 1502)  ? ? ?ED Course/ Medical Decision Making/ A&P ?Clinical Course as of 11/04/21 1800  ?Sun Nov 04, 2021  ?1239 Dates that on stool studies they have done a CT abdomen and they just have not figured out why he is having the vomiting and diarrhea. [MB]  ?  ?Clinical Course User Index ?[MB] Hayden Rasmussen, MD  ? ?                        ?  Medical Decision Making ?Amount and/or Complexity of Data Reviewed ?Labs: ordered. ? ?Risk ?Prescription drug management. ? ?This patient complains of nausea vomiting diarrhea in the setting of recent chemotherapy; this involves an extensive number of treatment ?Options and is a complaint that carries with it a high risk of complications and ?morbidity. The differential includes medication reaction, dehydration, metabolic derangement, renal failure, infectious diarrhea ? ?I ordered, reviewed and interpreted labs, which included CBC with normal white count normal hemoglobin, platelets slightly low, chemistries with mildly low potassium elevated creatinine, LFTs fairly normal ?I ordered medication IV fluids oral potassium and reviewed PMP when indicated. ?Additional history obtained from patient's wife ?Previous records obtained and reviewed in epic including [facility] notes.  He had extensive work-up with stool studies C. difficile and CAT scan within the last week or so ?Cardiac monitoring reviewed, normal sinus rhythm ?Social determinants considered, no significant barriers ?Critical Interventions: None ? ?After the interventions stated above, I reevaluated the patient and found patient to be symptomatically improved. ?Admission and further testing considered, no indications for admission at this time.  Recommended continuing with his nausea and diarrhea medication and attempted to stay well-hydrated.  Referral given for GI as he has seen them in his past.  Return instructions discussed ? ? ? ? ? ? ? ? ? ?Final Clinical Impression(s) / ED  Diagnoses ?Final diagnoses:  ?Nausea vomiting and diarrhea  ?Dehydration  ?CLL (chronic lymphocytic leukemia) ([place])  ? ? ?Rx / DC Orders ?ED Discharge Orders   ? ? None  ? ?  ? ? ?  ?Hayden Rasmussen, MD ?11/04/21 180

## 2021-11-04 NOTE — Discharge Instructions (Signed)
You are seen in the emergency department for evaluation of nausea vomiting diarrhea and generalized weakness.  Your lab work showed your potassium was mildly low and you were dehydrated.  You were given IV fluids.  Please closely follow-up with your oncologist and also follow-up with gastroenterology.  Try to keep well-hydrated.  Return if any worsening or concerning symptoms. ?

## 2021-11-07 DIAGNOSIS — R197 Diarrhea, unspecified: Secondary | ICD-10-CM | POA: Diagnosis not present

## 2021-11-07 DIAGNOSIS — C9110 Chronic lymphocytic leukemia of B-cell type not having achieved remission: Secondary | ICD-10-CM | POA: Diagnosis not present

## 2021-11-20 DIAGNOSIS — R197 Diarrhea, unspecified: Secondary | ICD-10-CM | POA: Diagnosis not present

## 2021-11-21 ENCOUNTER — Encounter: Payer: Self-pay | Admitting: Emergency Medicine

## 2021-12-07 ENCOUNTER — Ambulatory Visit: Payer: Medicare PPO | Admitting: Neurology

## 2021-12-07 ENCOUNTER — Encounter: Payer: Self-pay | Admitting: Neurology

## 2021-12-07 VITALS — BP 123/83 | HR 74 | Ht 72.0 in | Wt 200.2 lb

## 2021-12-07 DIAGNOSIS — G3184 Mild cognitive impairment, so stated: Secondary | ICD-10-CM

## 2021-12-07 DIAGNOSIS — Z8673 Personal history of transient ischemic attack (TIA), and cerebral infarction without residual deficits: Secondary | ICD-10-CM

## 2021-12-07 MED ORDER — DONEPEZIL HCL 5 MG PO TABS: 5.0000 mg | ORAL_TABLET | Freq: Every day | ORAL | 3 refills | Status: DC

## 2021-12-07 NOTE — Patient Instructions (Signed)
Good to see you. ? ?Continue Donepezil '5mg'$  daily ? ?2. Continue aspirin, control of blood pressure, cholesterol, sugar levels ? ?3. Discuss shortness of breath with Dr. Delena Bali, hopefully we can get back to regular exercise soon ? ?4. Follow-up in 8-9 months, call for any changes ? ? ?FALL PRECAUTIONS: Be cautious when walking. Scan the area for obstacles that may increase the risk of trips and falls. When getting up in the mornings, sit up at the edge of the bed for a few minutes before getting out of bed. Consider elevating the bed at the head end to avoid drop of blood pressure when getting up. Walk always in a well-lit room (use night lights in the walls). Avoid area rugs or power cords from appliances in the middle of the walkways. Use a walker or a cane if necessary and consider physical therapy for balance exercise. Get your eyesight checked regularly. ? ?HOME SAFETY: Consider the safety of the kitchen when operating appliances like stoves, microwave oven, and blender. Consider having supervision and share cooking responsibilities until no longer able to participate in those. Accidents with firearms and other hazards in the house should be identified and addressed as well. ? ?DRIVING: Regarding driving, in patients with progressive memory problems, driving will be impaired. We advise to have someone else do the driving if trouble finding directions or if minor accidents are reported. Independent driving assessment is available to determine safety of driving. ? ?ABILITY TO BE LEFT ALONE: If patient is unable to contact 911 operator, consider using LifeLine, or when the need is there, arrange for someone to stay with patients. Smoking is a fire hazard, consider supervision or cessation. Risk of wandering should be assessed by caregiver and if detected at any point, supervision and safe proof recommendations should be instituted. ? ?MEDICATION SUPERVISION: Inability to self-administer medication needs to be  constantly addressed. Implement a mechanism to ensure safe administration of the medications. ? ?RECOMMENDATIONS FOR ALL PATIENTS WITH MEMORY PROBLEMS: ?1. Continue to exercise (Recommend 30 minutes of walking everyday, or 3 hours every week) ?2. Increase social interactions - continue going to [place] and enjoy social gatherings with friends and family ?3. Eat healthy, avoid fried foods and eat more fruits and vegetables ?4. Maintain adequate blood pressure, blood sugar, and blood cholesterol level. Reducing the risk of stroke and cardiovascular disease also helps promoting better memory. ?5. Avoid stressful situations. Live a simple life and avoid aggravations. Organize your time and prepare for the next day in anticipation. ?6. Sleep well, avoid any interruptions of sleep and avoid any distractions in the bedroom that may interfere with adequate sleep quality ?7. Avoid sugar, avoid sweets as there is a strong link between excessive sugar intake, diabetes, and cognitive impairment ?The Mediterranean diet has been shown to help patients reduce the risk of progressive memory disorders and reduces cardiovascular risk. This includes eating fish, eat fruits and green leafy vegetables, nuts like almonds and hazelnuts, walnuts, and also use olive oil. Avoid fast foods and fried foods as much as possible. Avoid sweets and sugar as sugar use has been linked to worsening of memory function. ? ? ?

## 2021-12-07 NOTE — Progress Notes (Signed)
? ?NEUROLOGY FOLLOW UP OFFICE NOTE ? ?[facility] ?[id number] ?1951-05-20 ? ?HISTORY OF PRESENT ILLNESS: ?I had the pleasure of seeing Jonathon Parker in follow-up in the neurology clinic on 12/07/2021.  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  He is again accompanied by his wife who helps supplement the history today. Since his last visit, he has started chemotherapy for CLL, causing GI side effects, shortness of breath. The constant numbness in his left arm and decreased dexterity are unchanged. His wife feels memory is the same, but she has noticed more difficulties with comprehension, asking her to explain more, or asking her about conversations with other people that he did not quite get. He is trying a little harder to remember his medications, it is a team effort. He drives and denies getting lost. His wife manages finances. He usually gets 4-5 hours of sleep, but his wife notes he is sleeping better than before. They are concerned about his shortness of breath with minimal activity, he gets short of breath after taking a shower and getting dressed. Because of this, he does not want to do anything, he is much less active and has lost muscle mass and reduced stamina. He denies any significant headaches, dizziness, vision changes, no falls. He still has balance issues mostly when standing up quickly. He is on Donepezil '5mg'$  daily without side effects.  ? ?History on Initial Assessment 07/18/2020: This is a 71 year old right-handed man with a history of CLL, hypertension, depression, asthma, presenting for evaluation of memory loss.  He feels his memory is not as good as it used to be. He started noticing changes 2 years ago, however his wife has noticed changes for at least 5 years. She noticed he would get confused about what she is trying to tell him, saying she is not clear. He occasionally repeats himself. He could not  recall if he took his medications so they got a pillbox last year which has helped. He manages his own medications. His wife manages finances. He denies getting lost driving. He would forget what he went to get in a room. He misplaces things. He has occasional word-finding difficulties. His wife has noticed some personality changes so Zoloft was added to Wellbutrin. He takes 1/2 tab Zoloft due to dizziness on full tablet. No hallucinations. Sleep is good with Trazodone. He feels his mood is "sometimes good." His mother had Alzheimer's disease. No history of significant head injuries. He drinks 2 glasses of alcohol at night.  ? ?He has pain in his neck and shoulders. He has had some bowel issues since colon surgery. He has had bilateral hand tremors for several years, affecting writing and when drinking from a glass. No family history of tremors. He denies any headaches, dizziness, diplopia, dysarthria, dysphagia, back pain, focal numbness/tingling/weakness, bladder dysfunction, anosmia, no falls. 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  ? ?Update 12/12/20: He presents after hospitalization for right thalamic stroke. He started having left arm numbness on 11/24/20. This progressed to left leg and face numbness, then when he got up he had left arm and leg weakness. He was dragging his left arm and leg but drove himself to his PCP office where EMS was called. He had been having issues controlling his BP and was going to his PCP every 2 weeks with different medication changes, SBP running between 160 and  200. In the ER, his NIHSS was 3, he was given TPA with some improvement in weakness. I personally reviewed MRI brain which showed an acute stroke in the lateral aspect of the right thalamus. There was moderate diffuse atrophy and mild chronic microvascular disease. CTA did not show any significant stenosis. TTE showed an EF of 19-50%, grade I diastolic dysfunction, left atrium mildly  dilated. LDL was 87, he was started on atorvastatin '20mg'$  daily. HbA1c was 5.6. He was discharged home on aspirin '81mg'$  daily for secondary stroke prevention.  ? ?Diagnostic Data: ?MRI brain without contrast done 03/2020 reported advanced atrophy for age. No acute changes. Images unavailable for review. He was started on Donepezil '5mg'$  daily which he is tolerating without side effects.  ? ?11/2020: MRI brain which showed an acute stroke in the lateral aspect of the right thalamus. There was moderate diffuse atrophy and mild chronic microvascular disease. CTA did not show any significant stenosis.  ?TTE showed an EF of 93-26%, grade I diastolic dysfunction, left atrium mildly dilated. LDL was 87  ? ?Laboratory Data: ?05/2020: TSH 1.820, B12 384 ? 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 He is on Donepezil '5mg'$  daily without side effects.  ? ? ?PAST MEDICAL HISTORY: ?Past Medical History:  ?Diagnosis Date  ? Allergy   ? Arthritis   ? not dx'd  ? Asthma   ? mild   ? Cholecystitis   ? CLL (chronic lymphocytic leukemia) ([place])   ? stage 0, oncologist Dr. Karle Starch in [place] [facility]    ? Depression   ? GERD (gastroesophageal reflux disease)   ? controlled with nexium   ? Hypertension   ? ? ?MEDICATIONS: ?Current Outpatient Medications on File Prior to Visit  ?Medication Sig Dispense Refill  ? albuterol (VENTOLIN HFA) 108 (90 Base) MCG/ACT inhaler Inhale 2 puffs into the lungs every 6 (six) hours as needed for wheezing or shortness of breath. 18 g 0  ? amLODipine (NORVASC) 10 MG tablet Take 1 tablet (10 mg total) by mouth daily. 30 tablet 1  ? aspirin EC 81 MG EC tablet Take 1 tablet (81 mg total) by mouth daily. Swallow whole. 30 tablet 0  ? atorvastatin (LIPITOR) 20 MG tablet Take 20 mg by mouth daily.    ? budesonide-formoterol (SYMBICORT) 80-4.5 MCG/ACT inhaler Inhale 2 puffs into the lungs in the  morning and at bedtime. 1 each 0  ? buPROPion (WELLBUTRIN XL) 300 MG 24 hr tablet Take 1 tablet (300 mg total) by mouth daily. 30 tablet 0  ? cholecalciferol (VITAMIN D3) 25 MCG (1000 UNIT) tablet Take 2 tablets (2,000 Units total) by mouth daily. Takes 2 tablets (1,000 units each) for a total of 2,000 units daily    ? colestipol (COLESTID) 1 g tablet Start 1 gram at night by mouth for 3 nights then increase to 2 grams at night up to 2 grams twice daily as needed to control diarrhea    ? diphenoxylate-atropine (LOMOTIL) 2.5-0.025 MG tablet Take by mouth.    ? docusate sodium (COLACE) 100 MG capsule Take 1 capsule (100 mg total) by mouth 2 (two) times daily. 60 capsule 0  ? donepezil (ARICEPT) 5 MG tablet Take 1 tablet (5 mg total) by mouth daily. 90 tablet 3  ? ondansetron (ZOFRAN) 8 MG tablet Take 8 mg by mouth every 8 (eight) hours as needed.    ? pantoprazole (PROTONIX) 40 MG tablet Take by mouth.    ?  potassium chloride (KLOR-CON) 10 MEQ tablet Take by mouth.    ? sertraline (ZOLOFT) 25 MG tablet Take 25 mg by mouth daily.    ? traZODone (DESYREL) 100 MG tablet Take 1 tablet (100 mg total) by mouth at bedtime. 10 tablet 0  ? cholestyramine (QUESTRAN) 4 g packet Take 1 packet by mouth 2 (two) times daily.    ? esomeprazole (NEXIUM) 40 MG capsule Take 1 capsule (40 mg total) by mouth daily. 30 capsule 0  ? metoprolol succinate (TOPROL-XL) 50 MG 24 hr tablet Take 50 mg by mouth daily. Take with or immediately following a meal.    ? polyethylene glycol powder (GLYCOLAX/MIRALAX) 17 GM/SCOOP powder Take 1 capful of powder mixed in drink by mouth 1 to 3 times daily Until daily soft stools ? ?OTC 225 g 0  ? valsartan (DIOVAN) 320 MG tablet Take 1 tablet (320 mg total) by mouth daily. 30 tablet 0  ? ?No current facility-administered medications on file prior to visit.  ? ? ?ALLERGIES: ?Allergies  ?Allergen Reactions  ? Lisinopril Cough  ? ? ?FAMILY HISTORY: ?Family History  ?Problem Relation Age of Onset  ? Prostate  cancer Brother   ? Colon cancer Neg Hx   ? Esophageal cancer Neg Hx   ? Colon polyps Neg Hx   ? Rectal cancer Neg Hx   ? Stomach cancer Neg Hx   ? ? ?SOCIAL HISTORY: ?Social History  ? ?Socioeconomic History  ? Ma

## 2021-12-17 DIAGNOSIS — R0602 Shortness of breath: Secondary | ICD-10-CM | POA: Diagnosis not present

## 2021-12-17 DIAGNOSIS — I427 Cardiomyopathy due to drug and external agent: Secondary | ICD-10-CM | POA: Diagnosis not present

## 2022-01-09 DIAGNOSIS — R0609 Other forms of dyspnea: Secondary | ICD-10-CM | POA: Diagnosis not present

## 2022-01-09 DIAGNOSIS — C9110 Chronic lymphocytic leukemia of B-cell type not having achieved remission: Secondary | ICD-10-CM | POA: Diagnosis not present

## 2022-01-16 DIAGNOSIS — E785 Hyperlipidemia, unspecified: Secondary | ICD-10-CM | POA: Diagnosis not present

## 2022-01-16 DIAGNOSIS — R9431 Abnormal electrocardiogram [ECG] [EKG]: Secondary | ICD-10-CM | POA: Diagnosis not present

## 2022-01-16 DIAGNOSIS — Z5181 Encounter for therapeutic drug level monitoring: Secondary | ICD-10-CM | POA: Diagnosis not present

## 2022-01-16 DIAGNOSIS — I10 Essential (primary) hypertension: Secondary | ICD-10-CM | POA: Diagnosis not present

## 2022-01-16 DIAGNOSIS — Z8673 Personal history of transient ischemic attack (TIA), and cerebral infarction without residual deficits: Secondary | ICD-10-CM | POA: Diagnosis not present

## 2022-01-16 DIAGNOSIS — R0609 Other forms of dyspnea: Secondary | ICD-10-CM | POA: Diagnosis not present

## 2022-01-16 DIAGNOSIS — Z79899 Other long term (current) drug therapy: Secondary | ICD-10-CM | POA: Diagnosis not present

## 2022-01-16 DIAGNOSIS — M7989 Other specified soft tissue disorders: Secondary | ICD-10-CM | POA: Diagnosis not present

## 2022-02-01 DIAGNOSIS — L814 Other melanin hyperpigmentation: Secondary | ICD-10-CM | POA: Diagnosis not present

## 2022-02-01 DIAGNOSIS — Z7189 Other specified counseling: Secondary | ICD-10-CM | POA: Diagnosis not present

## 2022-02-01 DIAGNOSIS — L923 Foreign body granuloma of the skin and subcutaneous tissue: Secondary | ICD-10-CM | POA: Diagnosis not present

## 2022-02-01 DIAGNOSIS — D485 Neoplasm of uncertain behavior of skin: Secondary | ICD-10-CM | POA: Diagnosis not present

## 2022-02-01 DIAGNOSIS — L820 Inflamed seborrheic keratosis: Secondary | ICD-10-CM | POA: Diagnosis not present

## 2022-02-01 DIAGNOSIS — L821 Other seborrheic keratosis: Secondary | ICD-10-CM | POA: Diagnosis not present

## 2022-02-01 DIAGNOSIS — C44319 Basal cell carcinoma of skin of other parts of face: Secondary | ICD-10-CM | POA: Diagnosis not present

## 2022-02-01 DIAGNOSIS — Z08 Encounter for follow-up examination after completed treatment for malignant neoplasm: Secondary | ICD-10-CM | POA: Diagnosis not present

## 2022-02-01 DIAGNOSIS — D225 Melanocytic nevi of trunk: Secondary | ICD-10-CM | POA: Diagnosis not present

## 2022-02-01 DIAGNOSIS — Z85828 Personal history of other malignant neoplasm of skin: Secondary | ICD-10-CM | POA: Diagnosis not present

## 2022-02-08 DIAGNOSIS — I351 Nonrheumatic aortic (valve) insufficiency: Secondary | ICD-10-CM | POA: Diagnosis not present

## 2022-02-08 DIAGNOSIS — R0609 Other forms of dyspnea: Secondary | ICD-10-CM | POA: Diagnosis not present

## 2022-02-08 DIAGNOSIS — R9431 Abnormal electrocardiogram [ECG] [EKG]: Secondary | ICD-10-CM | POA: Diagnosis not present

## 2022-02-13 DIAGNOSIS — C9110 Chronic lymphocytic leukemia of B-cell type not having achieved remission: Secondary | ICD-10-CM | POA: Diagnosis not present

## 2022-02-13 DIAGNOSIS — R600 Localized edema: Secondary | ICD-10-CM | POA: Diagnosis not present

## 2022-02-13 DIAGNOSIS — I10 Essential (primary) hypertension: Secondary | ICD-10-CM | POA: Diagnosis not present

## 2022-02-13 DIAGNOSIS — R0609 Other forms of dyspnea: Secondary | ICD-10-CM | POA: Diagnosis not present

## 2022-02-26 DIAGNOSIS — C44319 Basal cell carcinoma of skin of other parts of face: Secondary | ICD-10-CM | POA: Diagnosis not present

## 2022-02-27 DIAGNOSIS — E785 Hyperlipidemia, unspecified: Secondary | ICD-10-CM | POA: Diagnosis not present

## 2022-02-27 DIAGNOSIS — F5104 Psychophysiologic insomnia: Secondary | ICD-10-CM | POA: Diagnosis not present

## 2022-02-27 DIAGNOSIS — C9110 Chronic lymphocytic leukemia of B-cell type not having achieved remission: Secondary | ICD-10-CM | POA: Diagnosis not present

## 2022-02-27 DIAGNOSIS — Z125 Encounter for screening for malignant neoplasm of prostate: Secondary | ICD-10-CM | POA: Diagnosis not present

## 2022-02-27 DIAGNOSIS — E559 Vitamin D deficiency, unspecified: Secondary | ICD-10-CM | POA: Diagnosis not present

## 2022-02-27 DIAGNOSIS — I10 Essential (primary) hypertension: Secondary | ICD-10-CM | POA: Diagnosis not present

## 2022-02-27 DIAGNOSIS — F331 Major depressive disorder, recurrent, moderate: Secondary | ICD-10-CM | POA: Diagnosis not present

## 2022-02-27 DIAGNOSIS — K219 Gastro-esophageal reflux disease without esophagitis: Secondary | ICD-10-CM | POA: Diagnosis not present

## 2022-02-27 DIAGNOSIS — R413 Other amnesia: Secondary | ICD-10-CM | POA: Diagnosis not present

## 2022-03-01 DIAGNOSIS — M7989 Other specified soft tissue disorders: Secondary | ICD-10-CM | POA: Diagnosis not present

## 2022-03-08 ENCOUNTER — Other Ambulatory Visit: Payer: Self-pay | Admitting: Emergency Medicine

## 2022-03-19 DIAGNOSIS — K449 Diaphragmatic hernia without obstruction or gangrene: Secondary | ICD-10-CM | POA: Diagnosis not present

## 2022-03-19 DIAGNOSIS — C9110 Chronic lymphocytic leukemia of B-cell type not having achieved remission: Secondary | ICD-10-CM | POA: Diagnosis not present

## 2022-03-20 DIAGNOSIS — C9110 Chronic lymphocytic leukemia of B-cell type not having achieved remission: Secondary | ICD-10-CM | POA: Diagnosis not present

## 2022-03-20 DIAGNOSIS — R0600 Dyspnea, unspecified: Secondary | ICD-10-CM | POA: Diagnosis not present

## 2022-03-25 DIAGNOSIS — R0609 Other forms of dyspnea: Secondary | ICD-10-CM | POA: Diagnosis not present

## 2022-03-25 DIAGNOSIS — J45909 Unspecified asthma, uncomplicated: Secondary | ICD-10-CM | POA: Diagnosis not present

## 2022-03-25 DIAGNOSIS — G4733 Obstructive sleep apnea (adult) (pediatric): Secondary | ICD-10-CM | POA: Diagnosis not present

## 2022-03-25 DIAGNOSIS — I10 Essential (primary) hypertension: Secondary | ICD-10-CM | POA: Diagnosis not present

## 2022-03-25 DIAGNOSIS — J984 Other disorders of lung: Secondary | ICD-10-CM | POA: Diagnosis not present

## 2022-04-01 DIAGNOSIS — J45909 Unspecified asthma, uncomplicated: Secondary | ICD-10-CM | POA: Diagnosis not present

## 2022-04-01 DIAGNOSIS — E785 Hyperlipidemia, unspecified: Secondary | ICD-10-CM | POA: Diagnosis not present

## 2022-04-01 DIAGNOSIS — I10 Essential (primary) hypertension: Secondary | ICD-10-CM | POA: Diagnosis not present

## 2022-04-09 DIAGNOSIS — J45909 Unspecified asthma, uncomplicated: Secondary | ICD-10-CM | POA: Diagnosis not present

## 2022-04-10 DIAGNOSIS — C9110 Chronic lymphocytic leukemia of B-cell type not having achieved remission: Secondary | ICD-10-CM | POA: Diagnosis not present

## 2022-04-10 DIAGNOSIS — I10 Essential (primary) hypertension: Secondary | ICD-10-CM | POA: Diagnosis not present

## 2022-04-10 DIAGNOSIS — D702 Other drug-induced agranulocytosis: Secondary | ICD-10-CM | POA: Diagnosis not present

## 2022-04-11 DIAGNOSIS — J45909 Unspecified asthma, uncomplicated: Secondary | ICD-10-CM | POA: Diagnosis not present

## 2022-04-18 DIAGNOSIS — J45909 Unspecified asthma, uncomplicated: Secondary | ICD-10-CM | POA: Diagnosis not present

## 2022-04-23 DIAGNOSIS — J45909 Unspecified asthma, uncomplicated: Secondary | ICD-10-CM | POA: Diagnosis not present

## 2022-04-25 DIAGNOSIS — J45909 Unspecified asthma, uncomplicated: Secondary | ICD-10-CM | POA: Diagnosis not present

## 2022-04-26 DIAGNOSIS — Z0000 Encounter for general adult medical examination without abnormal findings: Secondary | ICD-10-CM | POA: Diagnosis not present

## 2022-04-26 DIAGNOSIS — Z139 Encounter for screening, unspecified: Secondary | ICD-10-CM | POA: Diagnosis not present

## 2022-04-26 DIAGNOSIS — E785 Hyperlipidemia, unspecified: Secondary | ICD-10-CM | POA: Diagnosis not present

## 2022-04-26 DIAGNOSIS — Z1331 Encounter for screening for depression: Secondary | ICD-10-CM | POA: Diagnosis not present

## 2022-04-26 DIAGNOSIS — Z9181 History of falling: Secondary | ICD-10-CM | POA: Diagnosis not present

## 2022-04-30 DIAGNOSIS — J45909 Unspecified asthma, uncomplicated: Secondary | ICD-10-CM | POA: Diagnosis not present

## 2022-05-02 ENCOUNTER — Ambulatory Visit: Payer: Medicare PPO | Admitting: Podiatry

## 2022-05-02 DIAGNOSIS — J45909 Unspecified asthma, uncomplicated: Secondary | ICD-10-CM | POA: Diagnosis not present

## 2022-05-07 DIAGNOSIS — I10 Essential (primary) hypertension: Secondary | ICD-10-CM | POA: Diagnosis not present

## 2022-05-07 DIAGNOSIS — J45909 Unspecified asthma, uncomplicated: Secondary | ICD-10-CM | POA: Diagnosis not present

## 2022-05-13 ENCOUNTER — Ambulatory Visit: Payer: Medicare PPO | Admitting: Podiatry

## 2022-05-13 DIAGNOSIS — S90229A Contusion of unspecified lesser toe(s) with damage to nail, initial encounter: Secondary | ICD-10-CM

## 2022-05-13 DIAGNOSIS — L601 Onycholysis: Secondary | ICD-10-CM

## 2022-05-13 NOTE — Progress Notes (Signed)
  Subjective:  Patient ID: Jonathon Parker, male    DOB: January 23, 1951,  MRN: [id number]  Chief Complaint  Patient presents with   Nail Problem    Nail pain 5th toe     71 y.o. male presents for painful left 5th toenail.  Injured the toe couple weeks ago possibly stubbed against something.  Has been on antibiotics including Keflex for 7 days related to redness in the toe prescribed by his primary care doctor.  He says that the redness has improved but he is still having pain in the left fifth toe.  The nail does feel loose to him.  Pain with any pressure on it.  Does report some level of neuropathy in the feet.  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 Karle Starch in [facility]     Depression    GERD (gastroesophageal reflux disease)    controlled with nexium    Hypertension     Allergies  Allergen Reactions   Lisinopril Cough    ROS: Negative except as per HPI above  Objective:  General: AAO x3, NAD  Dermatological: L 5th toenail with subungual hematoma presnet, nail is partially lysed and painful with palpation. Mild inflammation of left 5th toe.   Vascular:  Dorsalis Pedis artery and Posterior Tibial artery pedal pulses are 2/4 bilateral.  Capillary fill time < 3 sec to all digits.   Neruologic: Grossly intact via light touch bilateral. Protective threshold intact to all sites bilateral.   Musculoskeletal: No gross boney pedal deformities bilateral. No pain, crepitus, or limitation noted with foot and ankle range of motion bilateral. Muscular strength 5/5 in all groups tested bilateral.  Gait: Unassisted, Nonantalgic.   No images are attached to the encounter.  Assessment:   1. Nail plate separation   2. Subungual hematoma of fifth toe      Plan:  Patient was evaluated and treated and all questions answered.  #Left  5th toenail partial nail plate separation, nail trauma with mild subungual hematoma   -Patient elects to proceed with minor surgery to remove painful partially lysed nail today. Consent reviewed and signed by patient. -Ingrown nail excised. See procedure note. -Educated on post-procedure care including soaking. Written instructions provided and reviewed. -Patient to follow up in 2 weeks for nail check.  Procedure: total Left 5th toenail avulsion Location: Left 5th toe total nail avulsion Anesthesia: Lidocaine 1% plain; 1.5 mL and Marcaine 0.5% plain; 1.5 mL, digital block. Skin Prep: Betadine. Dressing: Silvadene; telfa; dry, sterile, compression dressing. Technique: Following skin prep, the toe was exsanguinated and a tourniquet was secured at the base of the toe. The affected nail border was freed, grabbed with a hemostat, and excised in total. Nail bed was irrigated out with alcohol. The tourniquet was then removed and sterile dressing applied. Disposition: Patient tolerated procedure well. Patient to return in 2 weeks for follow-up.    Return in about 2 weeks (around 05/27/2022) for Follow up left 5th toe nail avulsion.          Everitt Amber, DPM [facility]

## 2022-05-13 NOTE — Patient Instructions (Signed)

## 2022-05-14 DIAGNOSIS — R0609 Other forms of dyspnea: Secondary | ICD-10-CM | POA: Diagnosis not present

## 2022-05-14 DIAGNOSIS — J45909 Unspecified asthma, uncomplicated: Secondary | ICD-10-CM | POA: Diagnosis not present

## 2022-05-14 DIAGNOSIS — I10 Essential (primary) hypertension: Secondary | ICD-10-CM | POA: Diagnosis not present

## 2022-05-16 DIAGNOSIS — R0609 Other forms of dyspnea: Secondary | ICD-10-CM | POA: Diagnosis not present

## 2022-05-16 DIAGNOSIS — J984 Other disorders of lung: Secondary | ICD-10-CM | POA: Diagnosis not present

## 2022-05-16 DIAGNOSIS — J45909 Unspecified asthma, uncomplicated: Secondary | ICD-10-CM | POA: Diagnosis not present

## 2022-05-16 DIAGNOSIS — G4733 Obstructive sleep apnea (adult) (pediatric): Secondary | ICD-10-CM | POA: Diagnosis not present

## 2022-05-16 DIAGNOSIS — Z2839 Other underimmunization status: Secondary | ICD-10-CM | POA: Diagnosis not present

## 2022-05-16 DIAGNOSIS — I10 Essential (primary) hypertension: Secondary | ICD-10-CM | POA: Diagnosis not present

## 2022-05-21 DIAGNOSIS — I10 Essential (primary) hypertension: Secondary | ICD-10-CM | POA: Diagnosis not present

## 2022-05-21 DIAGNOSIS — J45909 Unspecified asthma, uncomplicated: Secondary | ICD-10-CM | POA: Diagnosis not present

## 2022-05-27 ENCOUNTER — Ambulatory Visit: Payer: Medicare PPO | Admitting: Podiatry

## 2022-05-27 DIAGNOSIS — B351 Tinea unguium: Secondary | ICD-10-CM

## 2022-05-27 DIAGNOSIS — L601 Onycholysis: Secondary | ICD-10-CM

## 2022-05-27 DIAGNOSIS — M79675 Pain in left toe(s): Secondary | ICD-10-CM | POA: Diagnosis not present

## 2022-05-27 DIAGNOSIS — M79674 Pain in right toe(s): Secondary | ICD-10-CM | POA: Diagnosis not present

## 2022-05-27 DIAGNOSIS — J208 Acute bronchitis due to other specified organisms: Secondary | ICD-10-CM | POA: Diagnosis not present

## 2022-05-27 DIAGNOSIS — G5792 Unspecified mononeuropathy of left lower limb: Secondary | ICD-10-CM | POA: Diagnosis not present

## 2022-05-27 NOTE — Progress Notes (Signed)
Subjective: Jonathon Parker is a 71 y.o.  male returns to office today for follow up evaluation after having left 5th toenail avulsion for partial nail plate lysis after injury approximately 2 weeks ago. Patient has been soaking using epsom salts and applying topical antibiotic covered with bandaid daily. Patient denies fevers, chills, nausea, vomiting. Does report having abnormal growth and pain of toenails of both feet. Having trouble getting down to his feet to trim them.   Objective:  Vitals: Reviewed  General: Well developed, nourished, in no acute distress, alert and oriented x3   Dermatology: Skin is warm, dry and supple bilateral. Left 5th toenail bed appears to be clean, dry, with mild granular tissue and surrounding scab. There is no surrounding erythema, edema, drainage/purulence. There are no other lesions or other signs of infection present.  Nails thickened elongated discolored and dystrophic x 5 both feet.   Neurovascular status: Intact. No lower extremity swelling; No pain with calf compression bilateral.  Musculoskeletal: Decreased tenderness to palpation of the left 5th nailbed. Muscular strength within normal limits bilateral. Pain with palpation of the elongated and thickened toenails.   Assesement and Plan: S/p total nail avulsion to the left 5th toe, doing well.   -Continue soaking in epsom salts twice a day followed by antibiotic ointment and a band-aid. Can leave uncovered at night. Continue this until completely healed.  -If the area has not healed in 2 weeks, call the office for follow-up appointment, or sooner if any problems arise.  -Monitor for any signs/symptoms of infection. Call the office immediately if any occur or go directly to the emergency room. Call with any questions/concerns.  #Onychomycosis with pain  -Nails palliatively debrided as below. -Educated on self-care  Procedure: Nail Debridement Rationale: Pain Type of Debridement: manual, sharp  debridement. Instrumentation: Nail nipper, rotary burr. Number of Nails: [place], [place] / [facility]                   05/27/2022

## 2022-06-11 DIAGNOSIS — I10 Essential (primary) hypertension: Secondary | ICD-10-CM | POA: Diagnosis not present

## 2022-06-11 DIAGNOSIS — J45909 Unspecified asthma, uncomplicated: Secondary | ICD-10-CM | POA: Diagnosis not present

## 2022-06-18 DIAGNOSIS — N390 Urinary tract infection, site not specified: Secondary | ICD-10-CM | POA: Diagnosis not present

## 2022-06-20 DIAGNOSIS — C9110 Chronic lymphocytic leukemia of B-cell type not having achieved remission: Secondary | ICD-10-CM | POA: Diagnosis not present

## 2022-06-20 DIAGNOSIS — E871 Hypo-osmolality and hyponatremia: Secondary | ICD-10-CM | POA: Diagnosis not present

## 2022-06-20 DIAGNOSIS — R0902 Hypoxemia: Secondary | ICD-10-CM | POA: Diagnosis not present

## 2022-06-20 DIAGNOSIS — I959 Hypotension, unspecified: Secondary | ICD-10-CM | POA: Diagnosis not present

## 2022-06-20 DIAGNOSIS — D61818 Other pancytopenia: Secondary | ICD-10-CM | POA: Diagnosis not present

## 2022-06-20 DIAGNOSIS — Z8572 Personal history of non-Hodgkin lymphomas: Secondary | ICD-10-CM | POA: Diagnosis not present

## 2022-06-20 DIAGNOSIS — D709 Neutropenia, unspecified: Secondary | ICD-10-CM | POA: Diagnosis not present

## 2022-06-20 DIAGNOSIS — K5710 Diverticulosis of small intestine without perforation or abscess without bleeding: Secondary | ICD-10-CM | POA: Diagnosis not present

## 2022-06-20 DIAGNOSIS — Z20822 Contact with and (suspected) exposure to covid-19: Secondary | ICD-10-CM | POA: Diagnosis not present

## 2022-06-20 DIAGNOSIS — K8689 Other specified diseases of pancreas: Secondary | ICD-10-CM | POA: Diagnosis not present

## 2022-06-20 DIAGNOSIS — I6381 Other cerebral infarction due to occlusion or stenosis of small artery: Secondary | ICD-10-CM | POA: Diagnosis not present

## 2022-06-20 DIAGNOSIS — R509 Fever, unspecified: Secondary | ICD-10-CM | POA: Diagnosis not present

## 2022-06-20 DIAGNOSIS — D696 Thrombocytopenia, unspecified: Secondary | ICD-10-CM | POA: Diagnosis not present

## 2022-06-20 DIAGNOSIS — R590 Localized enlarged lymph nodes: Secondary | ICD-10-CM | POA: Diagnosis not present

## 2022-06-20 DIAGNOSIS — D702 Other drug-induced agranulocytosis: Secondary | ICD-10-CM | POA: Diagnosis not present

## 2022-06-20 DIAGNOSIS — A419 Sepsis, unspecified organism: Secondary | ICD-10-CM | POA: Diagnosis not present

## 2022-06-20 DIAGNOSIS — R4182 Altered mental status, unspecified: Secondary | ICD-10-CM | POA: Diagnosis not present

## 2022-06-20 DIAGNOSIS — D84821 Immunodeficiency due to drugs: Secondary | ICD-10-CM | POA: Diagnosis not present

## 2022-06-20 DIAGNOSIS — D61810 Antineoplastic chemotherapy induced pancytopenia: Secondary | ICD-10-CM | POA: Diagnosis not present

## 2022-06-20 DIAGNOSIS — R531 Weakness: Secondary | ICD-10-CM | POA: Diagnosis not present

## 2022-06-20 DIAGNOSIS — D8481 Immunodeficiency due to conditions classified elsewhere: Secondary | ICD-10-CM | POA: Diagnosis not present

## 2022-06-20 DIAGNOSIS — R5081 Fever presenting with conditions classified elsewhere: Secondary | ICD-10-CM | POA: Diagnosis not present

## 2022-06-20 DIAGNOSIS — R0689 Other abnormalities of breathing: Secondary | ICD-10-CM | POA: Diagnosis not present

## 2022-06-25 DIAGNOSIS — C9110 Chronic lymphocytic leukemia of B-cell type not having achieved remission: Secondary | ICD-10-CM | POA: Diagnosis not present

## 2022-07-30 ENCOUNTER — Emergency Department (HOSPITAL_BASED_OUTPATIENT_CLINIC_OR_DEPARTMENT_OTHER)
Admission: EM | Admit: 2022-07-30 | Discharge: 2022-07-30 | Disposition: A | Discharge status: Home / Self Care | Payer: Medicare PPO | Attending: Emergency Medicine | Admitting: Emergency Medicine

## 2022-07-30 ENCOUNTER — Encounter (HOSPITAL_BASED_OUTPATIENT_CLINIC_OR_DEPARTMENT_OTHER): Payer: Self-pay | Admitting: Emergency Medicine

## 2022-07-30 ENCOUNTER — Other Ambulatory Visit: Payer: Self-pay

## 2022-07-30 ENCOUNTER — Emergency Department (HOSPITAL_BASED_OUTPATIENT_CLINIC_OR_DEPARTMENT_OTHER): Payer: Medicare PPO | Admitting: Radiology

## 2022-07-30 DIAGNOSIS — R9431 Abnormal electrocardiogram [ECG] [EKG]: Secondary | ICD-10-CM | POA: Diagnosis not present

## 2022-07-30 DIAGNOSIS — U071 COVID-19: Secondary | ICD-10-CM | POA: Diagnosis not present

## 2022-07-30 DIAGNOSIS — J45909 Unspecified asthma, uncomplicated: Secondary | ICD-10-CM | POA: Insufficient documentation

## 2022-07-30 DIAGNOSIS — Z7951 Long term (current) use of inhaled steroids: Secondary | ICD-10-CM | POA: Diagnosis not present

## 2022-07-30 DIAGNOSIS — Z7982 Long term (current) use of aspirin: Secondary | ICD-10-CM | POA: Diagnosis not present

## 2022-07-30 DIAGNOSIS — Z79899 Other long term (current) drug therapy: Secondary | ICD-10-CM | POA: Diagnosis not present

## 2022-07-30 DIAGNOSIS — Z856 Personal history of leukemia: Secondary | ICD-10-CM | POA: Diagnosis not present

## 2022-07-30 DIAGNOSIS — R059 Cough, unspecified: Secondary | ICD-10-CM | POA: Diagnosis not present

## 2022-07-30 DIAGNOSIS — R509 Fever, unspecified: Secondary | ICD-10-CM | POA: Diagnosis not present

## 2022-07-30 LAB — URINALYSIS, ROUTINE W REFLEX MICROSCOPIC
Bacteria, UA: NONE SEEN
Bilirubin Urine: NEGATIVE
Glucose, UA: NEGATIVE mg/dL
Hgb urine dipstick: NEGATIVE
Ketones, ur: NEGATIVE mg/dL
Leukocytes,Ua: NEGATIVE
Nitrite: NEGATIVE
Protein, ur: 30 mg/dL — AB
Specific Gravity, Urine: 1.039 — ABNORMAL HIGH (ref 1.005–1.030)
pH: 6 (ref 5.0–8.0)

## 2022-07-30 LAB — COMPREHENSIVE METABOLIC PANEL
ALT: 90 U/L — ABNORMAL HIGH (ref 0–44)
AST: 66 U/L — ABNORMAL HIGH (ref 15–41)
Albumin: 4.2 g/dL (ref 3.5–5.0)
Alkaline Phosphatase: 194 U/L — ABNORMAL HIGH (ref 38–126)
Anion gap: 6 (ref 5–15)
BUN: 12 mg/dL (ref 8–23)
CO2: 29 mmol/L (ref 22–32)
Calcium: 8.8 mg/dL — ABNORMAL LOW (ref 8.9–10.3)
Chloride: 101 mmol/L (ref 98–111)
Creatinine, Ser: 1.1 mg/dL (ref 0.61–1.24)
GFR, Estimated: >60 mL/min (ref >60)
Glucose, Bld: 102 mg/dL — ABNORMAL HIGH (ref 70–99)
Potassium: 3.6 mmol/L (ref 3.5–5.1)
Sodium: 136 mmol/L (ref 135–145)
Total Bilirubin: 1 mg/dL (ref 0.3–1.2)
Total Protein: 6.5 g/dL (ref 6.5–8.1)

## 2022-07-30 LAB — CBC WITH DIFFERENTIAL/PLATELET
Abs Immature Granulocytes: 0.02 10*3/uL (ref 0.00–0.07)
Basophils Absolute: 0 10*3/uL (ref 0.0–0.1)
Basophils Relative: 0 %
Eosinophils Absolute: 0 10*3/uL (ref 0.0–0.5)
Eosinophils Relative: 0 %
HCT: 35.1 % — ABNORMAL LOW (ref 39.0–52.0)
Hemoglobin: 11.9 g/dL — ABNORMAL LOW (ref 13.0–17.0)
Immature Granulocytes: 0 %
Lymphocytes Relative: 15 %
Lymphs Abs: 0.9 10*3/uL (ref 0.7–4.0)
MCH: 33.8 pg (ref 26.0–34.0)
MCHC: 33.9 g/dL (ref 30.0–36.0)
MCV: 99.7 fL (ref 80.0–100.0)
Monocytes Absolute: 0.6 10*3/uL (ref 0.1–1.0)
Monocytes Relative: 11 %
Neutro Abs: 4.2 10*3/uL (ref 1.7–7.7)
Neutrophils Relative %: 74 %
Platelets: 100 10*3/uL — ABNORMAL LOW (ref 150–400)
RBC: 3.52 MIL/uL — ABNORMAL LOW (ref 4.22–5.81)
RDW: 14.3 % (ref 11.5–15.5)
WBC: 5.7 10*3/uL (ref 4.0–10.5)
nRBC: 0 % (ref 0.0–0.2)

## 2022-07-30 LAB — RESP PANEL BY RT-PCR (RSV, FLU A&B, COVID)  RVPGX2
Influenza A by PCR: NEGATIVE
Influenza B by PCR: NEGATIVE
Resp Syncytial Virus by PCR: NEGATIVE
SARS Coronavirus 2 by RT PCR: POSITIVE — AB

## 2022-07-30 LAB — LACTIC ACID, PLASMA: Lactic Acid, Venous: 1.3 mmol/L (ref 0.5–1.9)

## 2022-07-30 MED ORDER — DOXYCYCLINE HYCLATE 100 MG PO CAPS: 100.0000 mg | ORAL_CAPSULE | Freq: Two times a day (BID) | ORAL | 0 refills | Status: AC

## 2022-07-30 NOTE — ED Triage Notes (Signed)
Pt arrived POV. Pt caox4 and ambulatory. Per pt's wife he began having a fever yesterday with productive cough and has had intermittent disorientation.   Pt's wife advised pt has hx CLL and was tx with chemo stopped 11/29, currently in remission but the last time he acted this way he was admitted for low WBC.   Temp 101.8 F this morning at home, PO tylenol taken at home at approx 0730 this morning.

## 2022-07-30 NOTE — ED Notes (Signed)
RN provided AVS using Teachback Method. Patient verbalizes understanding of Discharge Instructions. Opportunity for Questioning and Answers were provided by RN. Patient Discharged from ED ambulatory to Home. ° °

## 2022-07-30 NOTE — ED Provider Notes (Signed)
[facility] EMERGENCY DEPT Provider Note   CSN: [id number] Arrival date & time: 07/30/22  2094     History  Chief Complaint  Patient presents with   Fever    Jonathon Parker is a 71 y.o. male presenting with fevers, chills, myalgias and delirious behavior (wife reports that he has been talking to himself.  She says that it makes sense but he usually does not talk to himself.)  She reports it she and her husband have had congestion and that they have been around her grandchildren who are sick.  Patient denies any chest pain, shortness of breath, nausea, vomiting, diarrhea and just endorses the chills and difficulty sleeping.    Fever      Home Medications Prior to Admission medications   Medication Sig Start Date End Date Taking? Authorizing Provider  albuterol (VENTOLIN HFA) 108 (90 Base) MCG/ACT inhaler Inhale 2 puffs into the lungs every 6 (six) hours as needed for wheezing or shortness of breath. 11/26/20   August Albino, NP  amLODipine (NORVASC) 10 MG tablet Take 1 tablet (10 mg total) by mouth daily. 11/26/20   August Albino, NP  aspirin EC 81 MG EC tablet Take 1 tablet (81 mg total) by mouth daily. Swallow whole. 11/27/20   August Albino, NP  atorvastatin (LIPITOR) 20 MG tablet Take 20 mg by mouth daily. 03/22/21   [provider]  budesonide-formoterol (SYMBICORT) 80-4.5 MCG/ACT inhaler Inhale 2 puffs into the lungs in the morning and at bedtime. 11/26/20   August Albino, NP  buPROPion (WELLBUTRIN XL) 300 MG 24 hr tablet Take 1 tablet (300 mg total) by mouth daily. 11/26/20   August Albino, NP  cholecalciferol (VITAMIN D3) 25 MCG (1000 UNIT) tablet Take 2 tablets (2,000 Units total) by mouth daily. Takes 2 tablets (1,000 units each) for a total of 2,000 units daily 11/26/20   August Albino, NP  colestipol (COLESTID) 1 g tablet Start 1 gram at night by mouth for 3 nights then increase to 2 grams at night up to 2 grams twice daily as needed  to control diarrhea 11/20/21   [provider]  diphenoxylate-atropine (LOMOTIL) 2.5-0.025 MG tablet Take by mouth. 11/02/21   [provider]  docusate sodium (COLACE) 100 MG capsule Take 1 capsule (100 mg total) by mouth 2 (two) times daily. 11/26/20   August Albino, NP  donepezil (ARICEPT) 5 MG tablet Take 1 tablet (5 mg total) by mouth daily. 12/07/21   Cameron Sprang, MD  obinutuzumab 1,000 mg in sodium chloride 0.9 % 250 mL Inject 1,000 mg into the vein.    [provider]  ondansetron (ZOFRAN) 8 MG tablet Take 8 mg by mouth every 8 (eight) hours as needed. 10/31/21   [provider]  pantoprazole (PROTONIX) 40 MG tablet Take by mouth. 10/31/21   [provider]  potassium chloride (KLOR-CON) 10 MEQ tablet Take by mouth. 11/15/21   [provider]  sertraline (ZOLOFT) 25 MG tablet Take 25 mg by mouth daily. 02/27/21   [provider]  traZODone (DESYREL) 100 MG tablet Take 1 tablet (100 mg total) by mouth at bedtime. 11/26/20   August Albino, NP  VENETOCLAX PO Take 300 mg by mouth daily.    [provider]      Allergies    Lisinopril    Review of Systems   Review of Systems  Constitutional:  Positive for fever.    Physical Exam Updated  Vital Signs BP 102/63 (BP Location: Right Arm)   Pulse 76   Temp 98.9 F (37.2 C)   Resp 20   Ht 6' (1.829 m)   Wt 94.8 kg   SpO2 95%   BMI 28.35 kg/m  Physical Exam Vitals and nursing note reviewed.  Constitutional:      General: He is not in acute distress.    Appearance: Normal appearance. He is not ill-appearing.  HENT:     Head: Normocephalic and atraumatic.  Eyes:     General: No scleral icterus.    Conjunctiva/sclera: Conjunctivae normal.  Cardiovascular:     Rate and Rhythm: Normal rate and regular rhythm.  Pulmonary:     Effort: Pulmonary effort is normal. No respiratory distress.     Breath sounds: No wheezing.  Abdominal:     General: Abdomen is flat.      Palpations: Abdomen is soft.  Skin:    Findings: No rash.  Neurological:     Mental Status: He is alert.  Psychiatric:        Mood and Affect: Mood normal.     ED Results / Procedures / Treatments   Labs (all labs ordered are listed, but only abnormal results are displayed) Labs Reviewed  RESP PANEL BY RT-PCR (RSV, FLU A&B, COVID)  RVPGX2 - Abnormal; Notable for the following components:      Result Value   SARS Coronavirus 2 by RT PCR POSITIVE (*)    All other components within normal limits  COMPREHENSIVE METABOLIC PANEL - Abnormal; Notable for the following components:   Glucose, Bld 102 (*)    Calcium 8.8 (*)    AST 66 (*)    ALT 90 (*)    Alkaline Phosphatase 194 (*)    All other components within normal limits  CBC WITH DIFFERENTIAL/PLATELET - Abnormal; Notable for the following components:   RBC 3.52 (*)    Hemoglobin 11.9 (*)    HCT 35.1 (*)    Platelets 100 (*)    All other components within normal limits  URINALYSIS, ROUTINE W REFLEX MICROSCOPIC - Abnormal; Notable for the following components:   Specific Gravity, Urine 1.039 (*)    Protein, ur 30 (*)    All other components within normal limits  LACTIC ACID, PLASMA  LACTIC ACID, PLASMA    EKG None  Radiology DG Chest 2 View  Result Date: 07/30/2022 CLINICAL DATA:  Fever and productive cough EXAM: CHEST - 2 VIEW COMPARISON:  06/20/2022 FINDINGS: Heart size is normal. Mild aortic tortuosity. Mild atelectasis or atelectatic pneumonia in both lower lobes. No lobar consolidation or collapse. No effusion. No acute bone finding. IMPRESSION: Mild atelectasis or atelectatic pneumonia in both lower lobes. Electronically Signed   By: Nelson Chimes M.D.   On: 07/30/2022 10:57    Procedures Procedures   Medications Ordered in ED Medications - No data to display  ED Course/ Medical Decision Making/ A&P                           Medical Decision Making Amount and/or Complexity of Data Reviewed Labs:  ordered. Radiology: ordered.  Risk Prescription drug management.   71 year old male presenting today with fever, other symptoms and perceived abnormal behavior.  Differential includes but is not limited to urinary tract infection, electrolyte abnormality, brain mass, viral illness this is not an exhaustive differential.    Past Medical History / Co-morbidities / Social History: CLL in remission and  asthma     Physical Exam: Pertinent physical exam findings include Well-appearing, not tachycardic, not hypoxic and alert and oriented  Lab Tests: I ordered, and personally interpreted labs.  The pertinent results include: Normal white count   Imaging Studies: I ordered and independently visualized and interpreted chest x-ray and I agree with the radiologist that there appears to be some atelectasis.  Radiology also questions developing pneumonia   Cardiac Monitoring:  The patient was maintained on a cardiac monitor.  I viewed and interpreted the cardiac monitored which showed an underlying rhythm of: Sinus   Medications: Asymptomatic at this time   MDM/Disposition: This is a 71 year old male with a past medical history of CLL in remission, no active treatment presenting today due to a fever.  His wife reports that he has been having chills and myalgias since late last night.  She endorses similar symptoms in herself and the rest of her family.  Due to his history of cancer she was more concerned as last year when this happened he required admission to the hospital due to leukopenia.  Today patient is well-appearing.  Normal oxygen saturations and normal vital signs.  He was afebrile however did have some antipyretics this morning.  His blood counts were stable, no concerns there.  Due to his fever and wife's perceived abnormal behavior, I did consider a urinary tract infection on top of any other viral illness.  But his urinalysis was negative.  He did test positive for COVID which may be  contributing to his personality change and fever.  His x-ray showed some concern for a potential pneumonia.  Patient likely is developing a viral pneumonia due to his COVID-12 however due to his risk factors, fevers and other concerns, he will be started on antibiotics for a bacterial pneumonia.  I discussed this with my attending physician Dr. Billy Fischer.  I also weighed risks and benefits at the bedside with the patient and his wife.  We discussed that his liver enzymes were also slightly elevated however this may be secondary to increased alcohol use over the holidays versus dehydration versus other etiology.  I did request that he have his labs repeated with his PCP in about a week.  He can be reevaluated for this illness at that time as well.  He and his wife are agreeable.  They will be discharged at this time   Final Clinical Impression(s) / ED Diagnoses Final diagnoses:  COVID-19    Rx / DC Orders ED Discharge Orders          Ordered    doxycycline (VIBRAMYCIN) 100 MG capsule  2 times daily        07/30/22 1245           Results and diagnoses were explained to the patient. Return precautions discussed in full. Patient had no additional questions and expressed complete understanding.   This chart was dictated using voice recognition software.  Despite best efforts to proofread,  errors can occur which can change the documentation meaning.    Darliss Ridgel 07/30/22 1349    Gareth Morgan, MD 07/30/22 2356

## 2022-07-30 NOTE — Discharge Instructions (Addendum)
As we discussed, you tested positive for COVID today.  This is a virus however due to your increased risk for bacterial infection, you will be started on antibiotics.  Please take them as prescribed.  They may upset your stomach so take them with food.  Additionally, I would like you to be reevaluated by your PCP at the end of this week or early next week.  COVID can be treated with over-the-counter medications like Tylenol and ibuprofen.  Other options include DayQuil/NyQuil, Mucinex for congestion, Robitussin/Delsym for cough and any other over-the-counter medications.  It is very important that you stay hydrated during this time as well.  You may use things like Powerade, Gatorade, electrolyte powders and water.  We hope that you feel better and do not hesitate to return to the emergency department with any worsening symptoms, especially chest pain, shortness of breath, dizziness and loss of consciousness.

## 2022-08-13 ENCOUNTER — Ambulatory Visit: Payer: Medicare PPO | Admitting: Neurology

## 2022-08-16 DIAGNOSIS — J208 Acute bronchitis due to other specified organisms: Secondary | ICD-10-CM | POA: Diagnosis not present

## 2022-08-16 DIAGNOSIS — J019 Acute sinusitis, unspecified: Secondary | ICD-10-CM | POA: Diagnosis not present

## 2022-08-16 DIAGNOSIS — H6121 Impacted cerumen, right ear: Secondary | ICD-10-CM | POA: Diagnosis not present

## 2022-08-18 DIAGNOSIS — J45909 Unspecified asthma, uncomplicated: Secondary | ICD-10-CM | POA: Diagnosis not present

## 2022-08-19 DIAGNOSIS — H9201 Otalgia, right ear: Secondary | ICD-10-CM | POA: Diagnosis not present

## 2022-08-19 DIAGNOSIS — H6121 Impacted cerumen, right ear: Secondary | ICD-10-CM | POA: Diagnosis not present

## 2022-08-19 DIAGNOSIS — J019 Acute sinusitis, unspecified: Secondary | ICD-10-CM | POA: Diagnosis not present

## 2022-08-27 ENCOUNTER — Ambulatory Visit: Payer: Medicare PPO | Admitting: Podiatry

## 2022-08-27 DIAGNOSIS — M79675 Pain in left toe(s): Secondary | ICD-10-CM

## 2022-08-27 DIAGNOSIS — M79674 Pain in right toe(s): Secondary | ICD-10-CM | POA: Diagnosis not present

## 2022-08-27 DIAGNOSIS — B351 Tinea unguium: Secondary | ICD-10-CM

## 2022-08-27 DIAGNOSIS — G5792 Unspecified mononeuropathy of left lower limb: Secondary | ICD-10-CM

## 2022-08-27 NOTE — Progress Notes (Signed)
  Subjective:  Patient ID: Jonathon Parker, male    DOB: July 31, 1951,  MRN: [id number]  Chief Complaint  Patient presents with   Nail Problem    Routine Foot Care     72 y.o. male presents with the above complaint. History confirmed with patient. Patient presenting with pain related to dystrophic thickened elongated nails. Patient is unable to trim own nails related to nail dystrophy and/or mobility issues. Patient does not have a history of T2DM. No calluses.   Objective:  Physical Exam: warm, good capillary refill nail exam onychomycosis of the toenails, onycholysis, and dystrophic nails DP pulses palpable, PT pulses palpable, and protective sensation intact Left Foot:  Pain with palpation of nails due to elongation and dystrophic growth.  Right Foot: Pain with palpation of nails due to elongation and dystrophic growth.   Assessment:   1. Pain due to onychomycosis of toenails of both feet   2. Neuropathy of left foot      Plan:  Patient was evaluated and treated and all questions answered.   #Onychomycosis with pain  -Nails palliatively debrided as below. -Educated on self-care  Procedure: Nail Debridement Rationale: Pain Type of Debridement: manual, sharp debridement. Instrumentation: Nail nipper, rotary burr. Number of Nails: 10  No follow-ups on file.         Everitt Amber, DPM [facility]

## 2022-08-28 DIAGNOSIS — I351 Nonrheumatic aortic (valve) insufficiency: Secondary | ICD-10-CM | POA: Diagnosis not present

## 2022-08-28 DIAGNOSIS — I10 Essential (primary) hypertension: Secondary | ICD-10-CM | POA: Diagnosis not present

## 2022-08-28 DIAGNOSIS — Z9189 Other specified personal risk factors, not elsewhere classified: Secondary | ICD-10-CM | POA: Diagnosis not present

## 2022-08-28 DIAGNOSIS — Z5181 Encounter for therapeutic drug level monitoring: Secondary | ICD-10-CM | POA: Diagnosis not present

## 2022-08-28 DIAGNOSIS — Z79899 Other long term (current) drug therapy: Secondary | ICD-10-CM | POA: Diagnosis not present

## 2022-08-30 DIAGNOSIS — Z8673 Personal history of transient ischemic attack (TIA), and cerebral infarction without residual deficits: Secondary | ICD-10-CM | POA: Diagnosis not present

## 2022-08-30 DIAGNOSIS — R0681 Apnea, not elsewhere classified: Secondary | ICD-10-CM | POA: Diagnosis not present

## 2022-08-30 DIAGNOSIS — R0683 Snoring: Secondary | ICD-10-CM | POA: Diagnosis not present

## 2022-08-30 DIAGNOSIS — I10 Essential (primary) hypertension: Secondary | ICD-10-CM | POA: Diagnosis not present

## 2022-08-30 DIAGNOSIS — G4719 Other hypersomnia: Secondary | ICD-10-CM | POA: Diagnosis not present

## 2022-09-02 DIAGNOSIS — J208 Acute bronchitis due to other specified organisms: Secondary | ICD-10-CM | POA: Diagnosis not present

## 2022-09-02 DIAGNOSIS — R059 Cough, unspecified: Secondary | ICD-10-CM | POA: Diagnosis not present

## 2022-09-02 DIAGNOSIS — B9689 Other specified bacterial agents as the cause of diseases classified elsewhere: Secondary | ICD-10-CM | POA: Diagnosis not present

## 2022-09-07 DIAGNOSIS — J205 Acute bronchitis due to respiratory syncytial virus: Secondary | ICD-10-CM | POA: Diagnosis not present

## 2022-09-07 DIAGNOSIS — Z7982 Long term (current) use of aspirin: Secondary | ICD-10-CM | POA: Diagnosis not present

## 2022-09-07 DIAGNOSIS — R9431 Abnormal electrocardiogram [ECG] [EKG]: Secondary | ICD-10-CM | POA: Diagnosis not present

## 2022-09-07 DIAGNOSIS — Z79899 Other long term (current) drug therapy: Secondary | ICD-10-CM | POA: Diagnosis not present

## 2022-09-07 DIAGNOSIS — U071 COVID-19: Secondary | ICD-10-CM | POA: Diagnosis not present

## 2022-09-07 DIAGNOSIS — R509 Fever, unspecified: Secondary | ICD-10-CM | POA: Diagnosis not present

## 2022-09-07 DIAGNOSIS — R059 Cough, unspecified: Secondary | ICD-10-CM | POA: Diagnosis not present

## 2022-09-16 DIAGNOSIS — F331 Major depressive disorder, recurrent, moderate: Secondary | ICD-10-CM | POA: Diagnosis not present

## 2022-09-16 DIAGNOSIS — I10 Essential (primary) hypertension: Secondary | ICD-10-CM | POA: Diagnosis not present

## 2022-09-16 DIAGNOSIS — R413 Other amnesia: Secondary | ICD-10-CM | POA: Diagnosis not present

## 2022-09-16 DIAGNOSIS — F5104 Psychophysiologic insomnia: Secondary | ICD-10-CM | POA: Diagnosis not present

## 2022-09-16 DIAGNOSIS — K219 Gastro-esophageal reflux disease without esophagitis: Secondary | ICD-10-CM | POA: Diagnosis not present

## 2022-09-16 DIAGNOSIS — E559 Vitamin D deficiency, unspecified: Secondary | ICD-10-CM | POA: Diagnosis not present

## 2022-09-16 DIAGNOSIS — E785 Hyperlipidemia, unspecified: Secondary | ICD-10-CM | POA: Diagnosis not present

## 2022-09-16 DIAGNOSIS — C9110 Chronic lymphocytic leukemia of B-cell type not having achieved remission: Secondary | ICD-10-CM | POA: Diagnosis not present

## 2022-09-18 DIAGNOSIS — J45909 Unspecified asthma, uncomplicated: Secondary | ICD-10-CM | POA: Diagnosis not present

## 2022-09-30 DIAGNOSIS — D225 Melanocytic nevi of trunk: Secondary | ICD-10-CM | POA: Diagnosis not present

## 2022-09-30 DIAGNOSIS — L821 Other seborrheic keratosis: Secondary | ICD-10-CM | POA: Diagnosis not present

## 2022-09-30 DIAGNOSIS — R238 Other skin changes: Secondary | ICD-10-CM | POA: Diagnosis not present

## 2022-09-30 DIAGNOSIS — R208 Other disturbances of skin sensation: Secondary | ICD-10-CM | POA: Diagnosis not present

## 2022-09-30 DIAGNOSIS — L538 Other specified erythematous conditions: Secondary | ICD-10-CM | POA: Diagnosis not present

## 2022-09-30 DIAGNOSIS — Z08 Encounter for follow-up examination after completed treatment for malignant neoplasm: Secondary | ICD-10-CM | POA: Diagnosis not present

## 2022-09-30 DIAGNOSIS — Z7189 Other specified counseling: Secondary | ICD-10-CM | POA: Diagnosis not present

## 2022-09-30 DIAGNOSIS — B078 Other viral warts: Secondary | ICD-10-CM | POA: Diagnosis not present

## 2022-09-30 DIAGNOSIS — Z85828 Personal history of other malignant neoplasm of skin: Secondary | ICD-10-CM | POA: Diagnosis not present

## 2022-10-07 DIAGNOSIS — G4719 Other hypersomnia: Secondary | ICD-10-CM | POA: Diagnosis not present

## 2022-10-07 DIAGNOSIS — R0683 Snoring: Secondary | ICD-10-CM | POA: Diagnosis not present

## 2022-10-07 DIAGNOSIS — Z8673 Personal history of transient ischemic attack (TIA), and cerebral infarction without residual deficits: Secondary | ICD-10-CM | POA: Diagnosis not present

## 2022-10-07 DIAGNOSIS — R0681 Apnea, not elsewhere classified: Secondary | ICD-10-CM | POA: Diagnosis not present

## 2022-10-10 DIAGNOSIS — I10 Essential (primary) hypertension: Secondary | ICD-10-CM | POA: Diagnosis not present

## 2022-10-10 DIAGNOSIS — Z5181 Encounter for therapeutic drug level monitoring: Secondary | ICD-10-CM | POA: Diagnosis not present

## 2022-10-10 DIAGNOSIS — Z79899 Other long term (current) drug therapy: Secondary | ICD-10-CM | POA: Diagnosis not present

## 2022-10-10 DIAGNOSIS — Z9189 Other specified personal risk factors, not elsewhere classified: Secondary | ICD-10-CM | POA: Diagnosis not present

## 2022-10-16 DIAGNOSIS — I471 Supraventricular tachycardia, unspecified: Secondary | ICD-10-CM | POA: Diagnosis not present

## 2022-10-16 DIAGNOSIS — I491 Atrial premature depolarization: Secondary | ICD-10-CM | POA: Diagnosis not present

## 2022-10-16 DIAGNOSIS — I493 Ventricular premature depolarization: Secondary | ICD-10-CM | POA: Diagnosis not present

## 2022-10-17 DIAGNOSIS — J45909 Unspecified asthma, uncomplicated: Secondary | ICD-10-CM | POA: Diagnosis not present

## 2022-10-31 DIAGNOSIS — F32A Depression, unspecified: Secondary | ICD-10-CM | POA: Diagnosis not present

## 2022-10-31 DIAGNOSIS — J9811 Atelectasis: Secondary | ICD-10-CM | POA: Diagnosis not present

## 2022-10-31 DIAGNOSIS — C9110 Chronic lymphocytic leukemia of B-cell type not having achieved remission: Secondary | ICD-10-CM | POA: Diagnosis not present

## 2022-10-31 DIAGNOSIS — R002 Palpitations: Secondary | ICD-10-CM | POA: Diagnosis not present

## 2022-10-31 DIAGNOSIS — R5383 Other fatigue: Secondary | ICD-10-CM | POA: Diagnosis not present

## 2022-10-31 DIAGNOSIS — I4891 Unspecified atrial fibrillation: Secondary | ICD-10-CM | POA: Diagnosis not present

## 2022-10-31 DIAGNOSIS — Z8709 Personal history of other diseases of the respiratory system: Secondary | ICD-10-CM | POA: Diagnosis not present

## 2022-10-31 DIAGNOSIS — Z8673 Personal history of transient ischemic attack (TIA), and cerebral infarction without residual deficits: Secondary | ICD-10-CM | POA: Diagnosis not present

## 2022-10-31 DIAGNOSIS — R7989 Other specified abnormal findings of blood chemistry: Secondary | ICD-10-CM | POA: Diagnosis not present

## 2022-10-31 DIAGNOSIS — Z9089 Acquired absence of other organs: Secondary | ICD-10-CM | POA: Diagnosis not present

## 2022-10-31 DIAGNOSIS — E785 Hyperlipidemia, unspecified: Secondary | ICD-10-CM | POA: Diagnosis not present

## 2022-10-31 DIAGNOSIS — F329 Major depressive disorder, single episode, unspecified: Secondary | ICD-10-CM | POA: Diagnosis not present

## 2022-10-31 DIAGNOSIS — K219 Gastro-esophageal reflux disease without esophagitis: Secondary | ICD-10-CM | POA: Diagnosis not present

## 2022-10-31 DIAGNOSIS — Z9049 Acquired absence of other specified parts of digestive tract: Secondary | ICD-10-CM | POA: Diagnosis not present

## 2022-10-31 DIAGNOSIS — R55 Syncope and collapse: Secondary | ICD-10-CM | POA: Diagnosis not present

## 2022-10-31 DIAGNOSIS — J986 Disorders of diaphragm: Secondary | ICD-10-CM | POA: Diagnosis not present

## 2022-10-31 DIAGNOSIS — J45909 Unspecified asthma, uncomplicated: Secondary | ICD-10-CM | POA: Diagnosis not present

## 2022-10-31 DIAGNOSIS — I10 Essential (primary) hypertension: Secondary | ICD-10-CM | POA: Diagnosis not present

## 2022-11-01 DIAGNOSIS — I10 Essential (primary) hypertension: Secondary | ICD-10-CM | POA: Diagnosis not present

## 2022-11-01 DIAGNOSIS — I4891 Unspecified atrial fibrillation: Secondary | ICD-10-CM | POA: Diagnosis not present

## 2022-11-01 DIAGNOSIS — R7989 Other specified abnormal findings of blood chemistry: Secondary | ICD-10-CM | POA: Diagnosis not present

## 2022-11-01 DIAGNOSIS — I517 Cardiomegaly: Secondary | ICD-10-CM | POA: Diagnosis not present

## 2022-11-01 DIAGNOSIS — R55 Syncope and collapse: Secondary | ICD-10-CM | POA: Diagnosis not present

## 2022-11-01 DIAGNOSIS — R0609 Other forms of dyspnea: Secondary | ICD-10-CM | POA: Diagnosis not present

## 2022-11-01 DIAGNOSIS — C9110 Chronic lymphocytic leukemia of B-cell type not having achieved remission: Secondary | ICD-10-CM | POA: Diagnosis not present

## 2022-11-05 DIAGNOSIS — I495 Sick sinus syndrome: Secondary | ICD-10-CM | POA: Diagnosis not present

## 2022-11-05 DIAGNOSIS — I480 Paroxysmal atrial fibrillation: Secondary | ICD-10-CM | POA: Diagnosis not present

## 2022-11-08 DIAGNOSIS — I495 Sick sinus syndrome: Secondary | ICD-10-CM | POA: Diagnosis not present

## 2022-11-12 DIAGNOSIS — Z8673 Personal history of transient ischemic attack (TIA), and cerebral infarction without residual deficits: Secondary | ICD-10-CM | POA: Diagnosis not present

## 2022-11-12 DIAGNOSIS — Z7982 Long term (current) use of aspirin: Secondary | ICD-10-CM | POA: Diagnosis not present

## 2022-11-12 DIAGNOSIS — C9110 Chronic lymphocytic leukemia of B-cell type not having achieved remission: Secondary | ICD-10-CM | POA: Diagnosis not present

## 2022-11-12 DIAGNOSIS — Z7901 Long term (current) use of anticoagulants: Secondary | ICD-10-CM | POA: Diagnosis not present

## 2022-11-12 DIAGNOSIS — I480 Paroxysmal atrial fibrillation: Secondary | ICD-10-CM | POA: Diagnosis not present

## 2022-11-12 DIAGNOSIS — Z950 Presence of cardiac pacemaker: Secondary | ICD-10-CM | POA: Diagnosis not present

## 2022-11-12 DIAGNOSIS — I495 Sick sinus syndrome: Secondary | ICD-10-CM | POA: Diagnosis not present

## 2022-11-12 DIAGNOSIS — J45909 Unspecified asthma, uncomplicated: Secondary | ICD-10-CM | POA: Diagnosis not present

## 2022-11-12 DIAGNOSIS — I10 Essential (primary) hypertension: Secondary | ICD-10-CM | POA: Diagnosis not present

## 2022-11-12 DIAGNOSIS — E785 Hyperlipidemia, unspecified: Secondary | ICD-10-CM | POA: Diagnosis not present

## 2022-11-17 DIAGNOSIS — J45909 Unspecified asthma, uncomplicated: Secondary | ICD-10-CM | POA: Diagnosis not present

## 2022-11-25 DIAGNOSIS — J208 Acute bronchitis due to other specified organisms: Secondary | ICD-10-CM | POA: Diagnosis not present

## 2022-11-25 DIAGNOSIS — B9689 Other specified bacterial agents as the cause of diseases classified elsewhere: Secondary | ICD-10-CM | POA: Diagnosis not present

## 2022-11-26 ENCOUNTER — Ambulatory Visit: Payer: Medicare PPO | Admitting: Podiatry

## 2022-11-30 DIAGNOSIS — R531 Weakness: Secondary | ICD-10-CM | POA: Diagnosis not present

## 2022-11-30 DIAGNOSIS — R0602 Shortness of breath: Secondary | ICD-10-CM | POA: Diagnosis not present

## 2022-11-30 DIAGNOSIS — K529 Noninfective gastroenteritis and colitis, unspecified: Secondary | ICD-10-CM | POA: Diagnosis not present

## 2022-11-30 DIAGNOSIS — Z7901 Long term (current) use of anticoagulants: Secondary | ICD-10-CM | POA: Diagnosis not present

## 2022-11-30 DIAGNOSIS — R109 Unspecified abdominal pain: Secondary | ICD-10-CM | POA: Diagnosis not present

## 2022-11-30 DIAGNOSIS — C9111 Chronic lymphocytic leukemia of B-cell type in remission: Secondary | ICD-10-CM | POA: Diagnosis not present

## 2022-11-30 DIAGNOSIS — I4892 Unspecified atrial flutter: Secondary | ICD-10-CM | POA: Diagnosis not present

## 2022-11-30 DIAGNOSIS — A09 Infectious gastroenteritis and colitis, unspecified: Secondary | ICD-10-CM | POA: Diagnosis not present

## 2022-11-30 DIAGNOSIS — R1084 Generalized abdominal pain: Secondary | ICD-10-CM | POA: Diagnosis not present

## 2022-11-30 DIAGNOSIS — A419 Sepsis, unspecified organism: Secondary | ICD-10-CM | POA: Diagnosis not present

## 2022-11-30 DIAGNOSIS — K5900 Constipation, unspecified: Secondary | ICD-10-CM | POA: Diagnosis not present

## 2022-11-30 DIAGNOSIS — R509 Fever, unspecified: Secondary | ICD-10-CM | POA: Diagnosis not present

## 2022-11-30 DIAGNOSIS — R000 Tachycardia, unspecified: Secondary | ICD-10-CM | POA: Diagnosis not present

## 2022-11-30 DIAGNOSIS — R935 Abnormal findings on diagnostic imaging of other abdominal regions, including retroperitoneum: Secondary | ICD-10-CM | POA: Diagnosis not present

## 2022-11-30 DIAGNOSIS — Z87891 Personal history of nicotine dependence: Secondary | ICD-10-CM | POA: Diagnosis not present

## 2022-11-30 DIAGNOSIS — I4891 Unspecified atrial fibrillation: Secondary | ICD-10-CM | POA: Diagnosis not present

## 2022-11-30 DIAGNOSIS — Z743 Need for continuous supervision: Secondary | ICD-10-CM | POA: Diagnosis not present

## 2022-11-30 DIAGNOSIS — Z888 Allergy status to other drugs, medicaments and biological substances status: Secondary | ICD-10-CM | POA: Diagnosis not present

## 2022-12-10 ENCOUNTER — Ambulatory Visit: Payer: Medicare PPO | Admitting: Podiatry

## 2022-12-10 DIAGNOSIS — G5792 Unspecified mononeuropathy of left lower limb: Secondary | ICD-10-CM

## 2022-12-10 DIAGNOSIS — B351 Tinea unguium: Secondary | ICD-10-CM | POA: Diagnosis not present

## 2022-12-10 DIAGNOSIS — M79674 Pain in right toe(s): Secondary | ICD-10-CM | POA: Diagnosis not present

## 2022-12-10 DIAGNOSIS — M79675 Pain in left toe(s): Secondary | ICD-10-CM

## 2022-12-10 NOTE — Progress Notes (Signed)
  Subjective:  Patient ID: Jonathon Parker, male    DOB: 1950/12/19,  MRN: [id number]  Chief Complaint  Patient presents with   Nail Problem    Routine Foot Care     72 y.o. male presents with the above complaint. History confirmed with patient. Patient presenting with pain related to dystrophic thickened elongated nails. Patient is unable to trim own nails related to nail dystrophy and/or mobility issues. Patient does not have a history of T2DM. No calluses.   Objective:  Physical Exam: warm, good capillary refill nail exam onychomycosis of the toenails, onycholysis, and dystrophic nails DP pulses palpable, PT pulses palpable, and protective sensation intact Left Foot:  Pain with palpation of nails due to elongation and dystrophic growth.  Right Foot: Pain with palpation of nails due to elongation and dystrophic growth.   Assessment:   1. Pain due to onychomycosis of toenails of both feet   2. Neuropathy of left foot       Plan:  Patient was evaluated and treated and all questions answered.   #Onychomycosis with pain  -Nails palliatively debrided as below. -Educated on self-care  Procedure: Nail Debridement Rationale: Pain Type of Debridement: manual, sharp debridement. Instrumentation: Nail nipper, rotary burr. Number of Nails: 10  Return in about 3 months (around 03/12/2023) for RFC.         Corinna Gab, DPM [facility] / [facility]

## 2022-12-12 DIAGNOSIS — G4733 Obstructive sleep apnea (adult) (pediatric): Secondary | ICD-10-CM | POA: Diagnosis not present

## 2022-12-12 DIAGNOSIS — I4892 Unspecified atrial flutter: Secondary | ICD-10-CM | POA: Diagnosis not present

## 2022-12-12 DIAGNOSIS — I10 Essential (primary) hypertension: Secondary | ICD-10-CM | POA: Diagnosis not present

## 2022-12-12 DIAGNOSIS — A419 Sepsis, unspecified organism: Secondary | ICD-10-CM | POA: Diagnosis not present

## 2022-12-12 DIAGNOSIS — Z2839 Other underimmunization status: Secondary | ICD-10-CM | POA: Diagnosis not present

## 2022-12-12 DIAGNOSIS — J45909 Unspecified asthma, uncomplicated: Secondary | ICD-10-CM | POA: Diagnosis not present

## 2022-12-12 DIAGNOSIS — J984 Other disorders of lung: Secondary | ICD-10-CM | POA: Diagnosis not present

## 2022-12-12 DIAGNOSIS — R0609 Other forms of dyspnea: Secondary | ICD-10-CM | POA: Diagnosis not present

## 2022-12-12 DIAGNOSIS — K529 Noninfective gastroenteritis and colitis, unspecified: Secondary | ICD-10-CM | POA: Diagnosis not present

## 2022-12-14 ENCOUNTER — Other Ambulatory Visit: Payer: Self-pay

## 2022-12-14 ENCOUNTER — Emergency Department (HOSPITAL_BASED_OUTPATIENT_CLINIC_OR_DEPARTMENT_OTHER): Payer: Medicare PPO

## 2022-12-14 ENCOUNTER — Encounter (HOSPITAL_BASED_OUTPATIENT_CLINIC_OR_DEPARTMENT_OTHER): Payer: Self-pay

## 2022-12-14 ENCOUNTER — Inpatient Hospital Stay (HOSPITAL_BASED_OUTPATIENT_CLINIC_OR_DEPARTMENT_OTHER)
Admission: EM | Admit: 2022-12-14 | Discharge: 2023-01-03 | DRG: 330 | Disposition: A | Discharge status: Skilled Nursing Facility | Payer: Medicare PPO | Attending: Internal Medicine | Admitting: Internal Medicine

## 2022-12-14 DIAGNOSIS — Z950 Presence of cardiac pacemaker: Secondary | ICD-10-CM | POA: Diagnosis not present

## 2022-12-14 DIAGNOSIS — Z85038 Personal history of other malignant neoplasm of large intestine: Secondary | ICD-10-CM

## 2022-12-14 DIAGNOSIS — Z7982 Long term (current) use of aspirin: Secondary | ICD-10-CM

## 2022-12-14 DIAGNOSIS — I4892 Unspecified atrial flutter: Secondary | ICD-10-CM | POA: Diagnosis not present

## 2022-12-14 DIAGNOSIS — Z9049 Acquired absence of other specified parts of digestive tract: Secondary | ICD-10-CM

## 2022-12-14 DIAGNOSIS — K529 Noninfective gastroenteritis and colitis, unspecified: Secondary | ICD-10-CM | POA: Diagnosis not present

## 2022-12-14 DIAGNOSIS — I4819 Other persistent atrial fibrillation: Secondary | ICD-10-CM | POA: Diagnosis not present

## 2022-12-14 DIAGNOSIS — K660 Peritoneal adhesions (postprocedural) (postinfection): Secondary | ICD-10-CM | POA: Diagnosis present

## 2022-12-14 DIAGNOSIS — E785 Hyperlipidemia, unspecified: Secondary | ICD-10-CM | POA: Diagnosis present

## 2022-12-14 DIAGNOSIS — I480 Paroxysmal atrial fibrillation: Secondary | ICD-10-CM | POA: Diagnosis present

## 2022-12-14 DIAGNOSIS — A419 Sepsis, unspecified organism: Secondary | ICD-10-CM | POA: Diagnosis present

## 2022-12-14 DIAGNOSIS — R509 Fever, unspecified: Secondary | ICD-10-CM | POA: Diagnosis not present

## 2022-12-14 DIAGNOSIS — I495 Sick sinus syndrome: Secondary | ICD-10-CM | POA: Diagnosis present

## 2022-12-14 DIAGNOSIS — J4520 Mild intermittent asthma, uncomplicated: Secondary | ICD-10-CM | POA: Diagnosis present

## 2022-12-14 DIAGNOSIS — D62 Acute posthemorrhagic anemia: Secondary | ICD-10-CM | POA: Diagnosis not present

## 2022-12-14 DIAGNOSIS — E440 Moderate protein-calorie malnutrition: Secondary | ICD-10-CM | POA: Diagnosis present

## 2022-12-14 DIAGNOSIS — Z9221 Personal history of antineoplastic chemotherapy: Secondary | ICD-10-CM | POA: Diagnosis not present

## 2022-12-14 DIAGNOSIS — Z8042 Family history of malignant neoplasm of prostate: Secondary | ICD-10-CM | POA: Diagnosis not present

## 2022-12-14 DIAGNOSIS — I2510 Atherosclerotic heart disease of native coronary artery without angina pectoris: Secondary | ICD-10-CM | POA: Diagnosis present

## 2022-12-14 DIAGNOSIS — F05 Delirium due to known physiological condition: Secondary | ICD-10-CM | POA: Diagnosis not present

## 2022-12-14 DIAGNOSIS — K929 Disease of digestive system, unspecified: Secondary | ICD-10-CM | POA: Diagnosis not present

## 2022-12-14 DIAGNOSIS — Z79899 Other long term (current) drug therapy: Secondary | ICD-10-CM

## 2022-12-14 DIAGNOSIS — G4733 Obstructive sleep apnea (adult) (pediatric): Secondary | ICD-10-CM | POA: Diagnosis present

## 2022-12-14 DIAGNOSIS — I472 Ventricular tachycardia, unspecified: Secondary | ICD-10-CM | POA: Diagnosis not present

## 2022-12-14 DIAGNOSIS — R4182 Altered mental status, unspecified: Secondary | ICD-10-CM | POA: Diagnosis not present

## 2022-12-14 DIAGNOSIS — J9811 Atelectasis: Secondary | ICD-10-CM | POA: Diagnosis not present

## 2022-12-14 DIAGNOSIS — F0393 Unspecified dementia, unspecified severity, with mood disturbance: Secondary | ICD-10-CM | POA: Diagnosis present

## 2022-12-14 DIAGNOSIS — Z515 Encounter for palliative care: Secondary | ICD-10-CM

## 2022-12-14 DIAGNOSIS — Z7401 Bed confinement status: Secondary | ICD-10-CM | POA: Diagnosis not present

## 2022-12-14 DIAGNOSIS — K567 Ileus, unspecified: Secondary | ICD-10-CM | POA: Diagnosis not present

## 2022-12-14 DIAGNOSIS — D539 Nutritional anemia, unspecified: Secondary | ICD-10-CM | POA: Diagnosis present

## 2022-12-14 DIAGNOSIS — R410 Disorientation, unspecified: Secondary | ICD-10-CM | POA: Diagnosis not present

## 2022-12-14 DIAGNOSIS — J45909 Unspecified asthma, uncomplicated: Secondary | ICD-10-CM | POA: Diagnosis present

## 2022-12-14 DIAGNOSIS — R1084 Generalized abdominal pain: Secondary | ICD-10-CM | POA: Diagnosis not present

## 2022-12-14 DIAGNOSIS — A4151 Sepsis due to Escherichia coli [E. coli]: Secondary | ICD-10-CM | POA: Diagnosis not present

## 2022-12-14 DIAGNOSIS — Z7951 Long term (current) use of inhaled steroids: Secondary | ICD-10-CM

## 2022-12-14 DIAGNOSIS — I4891 Unspecified atrial fibrillation: Secondary | ICD-10-CM | POA: Diagnosis not present

## 2022-12-14 DIAGNOSIS — I69398 Other sequelae of cerebral infarction: Secondary | ICD-10-CM

## 2022-12-14 DIAGNOSIS — F32A Depression, unspecified: Secondary | ICD-10-CM | POA: Diagnosis present

## 2022-12-14 DIAGNOSIS — I10 Essential (primary) hypertension: Secondary | ICD-10-CM | POA: Diagnosis present

## 2022-12-14 DIAGNOSIS — Z4659 Encounter for fitting and adjustment of other gastrointestinal appliance and device: Secondary | ICD-10-CM | POA: Diagnosis not present

## 2022-12-14 DIAGNOSIS — I4811 Longstanding persistent atrial fibrillation: Secondary | ICD-10-CM | POA: Diagnosis not present

## 2022-12-14 DIAGNOSIS — C9110 Chronic lymphocytic leukemia of B-cell type not having achieved remission: Secondary | ICD-10-CM | POA: Diagnosis not present

## 2022-12-14 DIAGNOSIS — K631 Perforation of intestine (nontraumatic): Secondary | ICD-10-CM | POA: Diagnosis not present

## 2022-12-14 DIAGNOSIS — K5700 Diverticulitis of small intestine with perforation and abscess without bleeding: Secondary | ICD-10-CM | POA: Diagnosis present

## 2022-12-14 DIAGNOSIS — K6389 Other specified diseases of intestine: Secondary | ICD-10-CM | POA: Diagnosis not present

## 2022-12-14 DIAGNOSIS — I499 Cardiac arrhythmia, unspecified: Secondary | ICD-10-CM | POA: Diagnosis not present

## 2022-12-14 DIAGNOSIS — Z7901 Long term (current) use of anticoagulants: Secondary | ICD-10-CM

## 2022-12-14 DIAGNOSIS — K630 Abscess of intestine: Secondary | ICD-10-CM | POA: Diagnosis not present

## 2022-12-14 DIAGNOSIS — R109 Unspecified abdominal pain: Secondary | ICD-10-CM | POA: Diagnosis not present

## 2022-12-14 DIAGNOSIS — K219 Gastro-esophageal reflux disease without esophagitis: Secondary | ICD-10-CM | POA: Diagnosis present

## 2022-12-14 DIAGNOSIS — K5289 Other specified noninfective gastroenteritis and colitis: Secondary | ICD-10-CM | POA: Diagnosis not present

## 2022-12-14 DIAGNOSIS — R0602 Shortness of breath: Secondary | ICD-10-CM | POA: Diagnosis not present

## 2022-12-14 DIAGNOSIS — Z4682 Encounter for fitting and adjustment of non-vascular catheter: Secondary | ICD-10-CM | POA: Diagnosis not present

## 2022-12-14 DIAGNOSIS — J90 Pleural effusion, not elsewhere classified: Secondary | ICD-10-CM | POA: Diagnosis not present

## 2022-12-14 HISTORY — DX: Presence of cardiac pacemaker: Z95.0

## 2022-12-14 LAB — CBC
HCT: 36.7 % — ABNORMAL LOW (ref 39.0–52.0)
Hemoglobin: 12.2 g/dL — ABNORMAL LOW (ref 13.0–17.0)
MCH: 33.9 pg (ref 26.0–34.0)
MCHC: 33.2 g/dL (ref 30.0–36.0)
MCV: 101.9 fL — ABNORMAL HIGH (ref 80.0–100.0)
Platelets: 221 10*3/uL (ref 150–400)
RBC: 3.6 MIL/uL — ABNORMAL LOW (ref 4.22–5.81)
RDW: 12.7 % (ref 11.5–15.5)
WBC: 11 10*3/uL — ABNORMAL HIGH (ref 4.0–10.5)
nRBC: 0 % (ref 0.0–0.2)

## 2022-12-14 LAB — LACTIC ACID, PLASMA: Lactic Acid, Venous: 1.7 mmol/L (ref 0.5–1.9)

## 2022-12-14 LAB — COMPREHENSIVE METABOLIC PANEL
ALT: 12 U/L (ref 0–44)
AST: 25 U/L (ref 15–41)
Albumin: 3.7 g/dL (ref 3.5–5.0)
Alkaline Phosphatase: 120 U/L (ref 38–126)
Anion gap: 7 (ref 5–15)
BUN: 12 mg/dL (ref 8–23)
CO2: 29 mmol/L (ref 22–32)
Calcium: 8.8 mg/dL — ABNORMAL LOW (ref 8.9–10.3)
Chloride: 103 mmol/L (ref 98–111)
Creatinine, Ser: 1 mg/dL (ref 0.61–1.24)
GFR, Estimated: >60 mL/min (ref >60)
Glucose, Bld: 141 mg/dL — ABNORMAL HIGH (ref 70–99)
Potassium: 4.4 mmol/L (ref 3.5–5.1)
Sodium: 139 mmol/L (ref 135–145)
Total Bilirubin: 0.7 mg/dL (ref 0.3–1.2)
Total Protein: 5.9 g/dL — ABNORMAL LOW (ref 6.5–8.1)

## 2022-12-14 LAB — CBG MONITORING, ED: Glucose-Capillary: 141 mg/dL — ABNORMAL HIGH (ref 70–99)

## 2022-12-14 LAB — LIPASE, BLOOD: Lipase: 24 U/L (ref 11–51)

## 2022-12-14 MED ORDER — SODIUM CHLORIDE 0.9 % IV BOLUS
1000.0000 mL | Freq: Once | INTRAVENOUS | Status: AC
  Administered 2022-12-14: 1000 mL via INTRAVENOUS

## 2022-12-14 MED ORDER — IOHEXOL 300 MG/ML  SOLN
100.0000 mL | Freq: Once | INTRAMUSCULAR | Status: AC | PRN
  Administered 2022-12-14: 85 mL via INTRAVENOUS

## 2022-12-14 MED ORDER — ONDANSETRON HCL 4 MG/2ML IJ SOLN: 4.0000 mg | Freq: Once | INTRAMUSCULAR | Status: DC | PRN

## 2022-12-14 NOTE — ED Notes (Signed)
Unable to collect second blood culture due to poor vein selection. Blood draw was attempted x2 without success.

## 2022-12-14 NOTE — ED Triage Notes (Signed)
Shaking, cold all day, progressive weakness today. Recent hx enteritis with sepsis. Pacemaker inserted about a month ago. Confusion onset this afternoon, per wife patient has been talking to himself today which is not normal for him.

## 2022-12-14 NOTE — ED Provider Notes (Signed)
[place] EMERGENCY DEPARTMENT AT [facility] Provider Note   CSN: [id number] Arrival date & time: 12/14/22  2124     History  Chief Complaint  Patient presents with   Weakness    Jonathon Parker is a 72 y.o. male.   Weakness Patient presents with generalized weakness.  Has had colds.  [place].  Discharged 10 days ago.  Had been on Cipro and Flagyl.  CT scan showed enteritis.  Has now finished medicines.  Continued diarrhea.  Now worsening abdominal pain.  Has been talking himself and more confused.  Patient states he feels bad all over.  No cough.  Has had decreased appetite.  Had recent pacemaker placed also.  Patient is on anticoagulation for atrial fibrillation.    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 Valentino Hue in [facility] hospital     Depression    GERD (gastroesophageal reflux disease)    controlled with nexium    Hypertension    Pacemaker     Home Medications Prior to Admission medications   Medication Sig Start Date End Date Taking? Authorizing Provider  albuterol (VENTOLIN HFA) 108 (90 Base) MCG/ACT inhaler Inhale 2 puffs into the lungs every 6 (six) hours as needed for wheezing or shortness of breath. 11/26/20   Mathews Argyle, NP  amLODipine (NORVASC) 10 MG tablet Take 1 tablet (10 mg total) by mouth daily. 11/26/20   Mathews Argyle, NP  aspirin EC 81 MG EC tablet Take 1 tablet (81 mg total) by mouth daily. Swallow whole. 11/27/20   Mathews Argyle, NP  atorvastatin (LIPITOR) 20 MG tablet Take 20 mg by mouth daily. 03/22/21   [provider]  budesonide-formoterol (SYMBICORT) 80-4.5 MCG/ACT inhaler Inhale 2 puffs into the lungs in the morning and at bedtime. 11/26/20   Mathews Argyle, NP  buPROPion (WELLBUTRIN XL) 300 MG 24 hr tablet  Take 1 tablet (300 mg total) by mouth daily. 11/26/20   Mathews Argyle, NP  cholecalciferol (VITAMIN D3) 25 MCG (1000 UNIT) tablet Take 2 tablets (2,000 Units total) by mouth daily. Takes 2 tablets (1,000 units each) for a total of 2,000 units daily 11/26/20   Mathews Argyle, NP  colestipol (COLESTID) 1 g tablet Start 1 gram at night by mouth for 3 nights then increase to 2 grams at night up to 2 grams twice daily as needed to control diarrhea 11/20/21   [provider]  diphenoxylate-atropine (LOMOTIL) 2.5-0.025 MG tablet Take by mouth. 11/02/21   [provider]  docusate sodium (COLACE) 100 MG capsule Take 1 capsule (100 mg total) by mouth 2 (two) times daily. 11/26/20   Mathews Argyle, NP  donepezil (ARICEPT) 5 MG tablet Take 1 tablet (5 mg total) by mouth daily. 12/07/21   Van Clines, MD  obinutuzumab 1,000 mg in sodium chloride 0.9 % 250 mL Inject 1,000 mg into the vein.    [provider]  ondansetron (ZOFRAN) 8 MG tablet Take 8 mg by mouth every 8 (eight) hours as needed. 10/31/21   [provider]  pantoprazole (PROTONIX) 40 MG tablet Take by mouth. 10/31/21   [provider]  potassium chloride (KLOR-CON) 10 MEQ tablet Take by mouth. 11/15/21  [provider]  sertraline (ZOLOFT) 25 MG tablet Take 25 mg by mouth daily. 02/27/21   [provider]  traZODone (DESYREL) 100 MG tablet Take 1 tablet (100 mg total) by mouth at bedtime. 11/26/20   Mathews Argyle, NP  VENETOCLAX PO Take 300 mg by mouth daily.    [provider]      Allergies    Lisinopril    Review of Systems   Review of Systems  Neurological:  Positive for weakness.    Physical Exam Updated Vital Signs BP 126/79 (BP Location: Right Arm)   Pulse 85   Temp 98.7 F (37.1 C) (Oral)   Resp 20   Ht 6' (1.829 m)   Wt 95.3 kg   SpO2 93%   BMI 28.48 kg/m  Physical Exam Vitals and nursing note reviewed.  Eyes:     Pupils: Pupils are equal, round, and  reactive to light.  Cardiovascular:     Rate and Rhythm: Regular rhythm.  Pulmonary:     Breath sounds: No wheezing or rhonchi.  Abdominal:     Tenderness: There is abdominal tenderness.     Comments: Diffuse abdominal tenderness no rebound or guarding.  No hernia palpated.  Musculoskeletal:        General: No tenderness.     Right lower leg: No edema.     Left lower leg: No edema.  Skin:    General: Skin is warm.     Coloration: Skin is not jaundiced.  Neurological:     Mental Status: He is alert and oriented to person, place, and time.     ED Results / Procedures / Treatments   Labs (all labs ordered are listed, but only abnormal results are displayed) Labs Reviewed  COMPREHENSIVE METABOLIC PANEL - Abnormal; Notable for the following components:      Result Value   Glucose, Bld 141 (*)    Calcium 8.8 (*)    Total Protein 5.9 (*)    All other components within normal limits  CBC - Abnormal; Notable for the following components:   WBC 11.0 (*)    RBC 3.60 (*)    Hemoglobin 12.2 (*)    HCT 36.7 (*)    MCV 101.9 (*)    All other components within normal limits  CBG MONITORING, ED - Abnormal; Notable for the following components:   Glucose-Capillary 141 (*)    All other components within normal limits  CULTURE, BLOOD (ROUTINE X 2)  CULTURE, BLOOD (ROUTINE X 2)  LIPASE, BLOOD  LACTIC ACID, PLASMA  LACTIC ACID, PLASMA  URINALYSIS, W/ REFLEX TO CULTURE (INFECTION SUSPECTED)    EKG EKG Interpretation  Date/Time:  Saturday Dec 14 2022 21:48:43 EDT Ventricular Rate:  78 PR Interval:  151 QRS Duration: 81 QT Interval:  407 QTC Calculation: 458 R Axis:   -26 Text Interpretation: Sinus rhythm Borderline left axis deviation Minimal ST depression, lateral leads Confirmed by Benjiman Core [phone]) on 12/14/2022 10:18:36 PM  Radiology DG Chest Portable 1 View  Result Date: 12/14/2022 CLINICAL DATA:  Fevers EXAM: PORTABLE CHEST 1 VIEW COMPARISON:  09/07/2022 FINDINGS:  Cardiac shadow is stable. Pacing device is now seen in satisfactory position. Lungs are clear. No bony abnormality is noted. IMPRESSION: No acute abnormality noted. Electronically Signed   By: Alcide Clever M.D.   On: 12/14/2022 22:36    Procedures Procedures    Medications Ordered in ED Medications  ondansetron (ZOFRAN) injection 4 mg (has no administration in time range)  sodium chloride 0.9 % bolus 1,000 mL (has no administration in time range)    ED Course/ Medical Decision Making/ A&P                             Medical Decision Making Amount and/or Complexity of Data Reviewed Labs: ordered. Radiology: ordered.  Risk Prescription drug management.   Patient presents with fever and chills.  Worsening abdominal pain with recent enteritis and sepsis.  Differential diagnosis includes infection in the abdomen  Sepsis, urinary tract infection.  Site of pacemaker is healing well.  Lungs clear.  Will get urinalysis and basic blood work.  Chest x-ray and CT scan of the abdomen pelvis.  Blood work so far reassuring.  White count mildly elevated.  However CMP reassuring and first lactic acid normal.  Urinalysis still pending.  Chest x-ray reassuring.  CT scan still pending.  Care turned over to Dr. Judd Lien       Final Clinical Impression(s) / ED Diagnoses Final diagnoses:  None    Rx / DC Orders ED Discharge Orders     None         Benjiman Core, MD 12/14/22 2318

## 2022-12-15 DIAGNOSIS — I69398 Other sequelae of cerebral infarction: Secondary | ICD-10-CM | POA: Diagnosis not present

## 2022-12-15 DIAGNOSIS — D62 Acute posthemorrhagic anemia: Secondary | ICD-10-CM | POA: Diagnosis not present

## 2022-12-15 DIAGNOSIS — Z9221 Personal history of antineoplastic chemotherapy: Secondary | ICD-10-CM | POA: Diagnosis not present

## 2022-12-15 DIAGNOSIS — C9110 Chronic lymphocytic leukemia of B-cell type not having achieved remission: Secondary | ICD-10-CM | POA: Diagnosis not present

## 2022-12-15 DIAGNOSIS — I495 Sick sinus syndrome: Secondary | ICD-10-CM | POA: Diagnosis present

## 2022-12-15 DIAGNOSIS — Z85038 Personal history of other malignant neoplasm of large intestine: Secondary | ICD-10-CM | POA: Diagnosis not present

## 2022-12-15 DIAGNOSIS — G4733 Obstructive sleep apnea (adult) (pediatric): Secondary | ICD-10-CM | POA: Diagnosis present

## 2022-12-15 DIAGNOSIS — K529 Noninfective gastroenteritis and colitis, unspecified: Principal | ICD-10-CM | POA: Diagnosis present

## 2022-12-15 DIAGNOSIS — K660 Peritoneal adhesions (postprocedural) (postinfection): Secondary | ICD-10-CM | POA: Diagnosis not present

## 2022-12-15 DIAGNOSIS — E440 Moderate protein-calorie malnutrition: Secondary | ICD-10-CM | POA: Diagnosis not present

## 2022-12-15 DIAGNOSIS — R109 Unspecified abdominal pain: Secondary | ICD-10-CM | POA: Diagnosis not present

## 2022-12-15 DIAGNOSIS — I4892 Unspecified atrial flutter: Secondary | ICD-10-CM | POA: Diagnosis not present

## 2022-12-15 DIAGNOSIS — I4819 Other persistent atrial fibrillation: Secondary | ICD-10-CM | POA: Diagnosis not present

## 2022-12-15 DIAGNOSIS — Z8042 Family history of malignant neoplasm of prostate: Secondary | ICD-10-CM | POA: Diagnosis not present

## 2022-12-15 DIAGNOSIS — I480 Paroxysmal atrial fibrillation: Secondary | ICD-10-CM | POA: Diagnosis present

## 2022-12-15 DIAGNOSIS — A4151 Sepsis due to Escherichia coli [E. coli]: Secondary | ICD-10-CM

## 2022-12-15 DIAGNOSIS — I10 Essential (primary) hypertension: Secondary | ICD-10-CM | POA: Diagnosis not present

## 2022-12-15 DIAGNOSIS — F32A Depression, unspecified: Secondary | ICD-10-CM | POA: Diagnosis not present

## 2022-12-15 DIAGNOSIS — K5700 Diverticulitis of small intestine with perforation and abscess without bleeding: Secondary | ICD-10-CM | POA: Diagnosis not present

## 2022-12-15 DIAGNOSIS — J45909 Unspecified asthma, uncomplicated: Secondary | ICD-10-CM | POA: Diagnosis present

## 2022-12-15 DIAGNOSIS — R410 Disorientation, unspecified: Secondary | ICD-10-CM | POA: Diagnosis not present

## 2022-12-15 DIAGNOSIS — F0393 Unspecified dementia, unspecified severity, with mood disturbance: Secondary | ICD-10-CM | POA: Diagnosis not present

## 2022-12-15 DIAGNOSIS — F05 Delirium due to known physiological condition: Secondary | ICD-10-CM | POA: Diagnosis not present

## 2022-12-15 DIAGNOSIS — D539 Nutritional anemia, unspecified: Secondary | ICD-10-CM | POA: Diagnosis not present

## 2022-12-15 DIAGNOSIS — Z7982 Long term (current) use of aspirin: Secondary | ICD-10-CM | POA: Diagnosis not present

## 2022-12-15 DIAGNOSIS — K567 Ileus, unspecified: Secondary | ICD-10-CM | POA: Diagnosis not present

## 2022-12-15 DIAGNOSIS — K631 Perforation of intestine (nontraumatic): Secondary | ICD-10-CM | POA: Diagnosis not present

## 2022-12-15 DIAGNOSIS — I4811 Longstanding persistent atrial fibrillation: Secondary | ICD-10-CM | POA: Diagnosis not present

## 2022-12-15 DIAGNOSIS — I472 Ventricular tachycardia, unspecified: Secondary | ICD-10-CM | POA: Diagnosis not present

## 2022-12-15 DIAGNOSIS — Z515 Encounter for palliative care: Secondary | ICD-10-CM | POA: Diagnosis not present

## 2022-12-15 DIAGNOSIS — Z79899 Other long term (current) drug therapy: Secondary | ICD-10-CM | POA: Diagnosis not present

## 2022-12-15 DIAGNOSIS — E785 Hyperlipidemia, unspecified: Secondary | ICD-10-CM | POA: Diagnosis present

## 2022-12-15 DIAGNOSIS — Z950 Presence of cardiac pacemaker: Secondary | ICD-10-CM | POA: Diagnosis not present

## 2022-12-15 LAB — URINALYSIS, W/ REFLEX TO CULTURE (INFECTION SUSPECTED)
Bilirubin Urine: NEGATIVE
Glucose, UA: NEGATIVE mg/dL
Hgb urine dipstick: NEGATIVE
Ketones, ur: NEGATIVE mg/dL
Leukocytes,Ua: NEGATIVE
Nitrite: NEGATIVE
Specific Gravity, Urine: >1.046 — ABNORMAL HIGH (ref 1.005–1.030)
pH: 8.5 — ABNORMAL HIGH (ref 5.0–8.0)

## 2022-12-15 LAB — CBC
HCT: 31.6 % — ABNORMAL LOW (ref 39.0–52.0)
Hemoglobin: 10.4 g/dL — ABNORMAL LOW (ref 13.0–17.0)
MCH: 34.1 pg — ABNORMAL HIGH (ref 26.0–34.0)
MCHC: 32.9 g/dL (ref 30.0–36.0)
MCV: 103.6 fL — ABNORMAL HIGH (ref 80.0–100.0)
Platelets: 178 10*3/uL (ref 150–400)
RBC: 3.05 MIL/uL — ABNORMAL LOW (ref 4.22–5.81)
RDW: 13 % (ref 11.5–15.5)
WBC: 8.1 10*3/uL (ref 4.0–10.5)
nRBC: 0 % (ref 0.0–0.2)

## 2022-12-15 LAB — C DIFFICILE QUICK SCREEN W PCR REFLEX
C Diff antigen: NEGATIVE
C Diff interpretation: NOT DETECTED
C Diff toxin: NEGATIVE

## 2022-12-15 LAB — CREATININE, SERUM
Creatinine, Ser: 1.02 mg/dL (ref 0.61–1.24)
GFR, Estimated: >60 mL/min (ref >60)

## 2022-12-15 MED ORDER — MELATONIN 3 MG PO TABS
3.0000 mg | ORAL_TABLET | Freq: Every day | ORAL | Status: DC
  Administered 2022-12-15 – 2022-12-24 (×4): 3 mg via ORAL
  Filled 2022-12-15 (×8): qty 1

## 2022-12-15 MED ORDER — ACETAMINOPHEN 650 MG RE SUPP: 650.0000 mg | Freq: Four times a day (QID) | RECTAL | Status: DC | PRN

## 2022-12-15 MED ORDER — QUETIAPINE FUMARATE 25 MG PO TABS: 25.0000 mg | ORAL_TABLET | Freq: Every evening | ORAL | Status: DC | PRN

## 2022-12-15 MED ORDER — DONEPEZIL HCL 5 MG PO TABS
5.0000 mg | ORAL_TABLET | Freq: Every day | ORAL | Status: DC
  Administered 2022-12-15 – 2023-01-03 (×15): 5 mg via ORAL
  Filled 2022-12-15 (×18): qty 1

## 2022-12-15 MED ORDER — ENOXAPARIN SODIUM 40 MG/0.4ML IJ SOSY
40.0000 mg | PREFILLED_SYRINGE | INTRAMUSCULAR | Status: DC
  Administered 2022-12-15: 40 mg via SUBCUTANEOUS
  Filled 2022-12-15: qty 0.4

## 2022-12-15 MED ORDER — ACETAMINOPHEN 500 MG PO TABS: 1000.0000 mg | ORAL_TABLET | Freq: Once | ORAL | Status: DC

## 2022-12-15 MED ORDER — IBUPROFEN 400 MG PO TABS
600.0000 mg | ORAL_TABLET | Freq: Once | ORAL | Status: AC
  Administered 2022-12-15: 600 mg via ORAL
  Filled 2022-12-15: qty 1

## 2022-12-15 MED ORDER — ATORVASTATIN CALCIUM 10 MG PO TABS
20.0000 mg | ORAL_TABLET | Freq: Every day | ORAL | Status: DC
  Administered 2022-12-15 – 2023-01-03 (×15): 20 mg via ORAL
  Filled 2022-12-15 (×18): qty 2

## 2022-12-15 MED ORDER — TRAZODONE HCL 100 MG PO TABS
100.0000 mg | ORAL_TABLET | Freq: Every day | ORAL | Status: DC
  Administered 2022-12-15 – 2022-12-24 (×5): 100 mg via ORAL
  Filled 2022-12-15 (×2): qty 1
  Filled 2022-12-15: qty 2
  Filled 2022-12-15 (×6): qty 1

## 2022-12-15 MED ORDER — ALBUTEROL SULFATE (2.5 MG/3ML) 0.083% IN NEBU: 2.5000 mL | INHALATION_SOLUTION | Freq: Four times a day (QID) | RESPIRATORY_TRACT | Status: DC | PRN

## 2022-12-15 MED ORDER — SODIUM CHLORIDE 0.9 % IV SOLN: INTRAVENOUS | Status: AC

## 2022-12-15 MED ORDER — MOMETASONE FURO-FORMOTEROL FUM 100-5 MCG/ACT IN AERO: 2.0000 | INHALATION_SPRAY | Freq: Two times a day (BID) | RESPIRATORY_TRACT | Status: DC

## 2022-12-15 MED ORDER — SERTRALINE HCL 50 MG PO TABS
25.0000 mg | ORAL_TABLET | Freq: Every day | ORAL | Status: DC
  Administered 2022-12-15 – 2022-12-24 (×7): 25 mg via ORAL
  Filled 2022-12-15 (×8): qty 1

## 2022-12-15 MED ORDER — BUPROPION HCL ER (XL) 150 MG PO TB24
300.0000 mg | ORAL_TABLET | Freq: Every day | ORAL | Status: DC
  Administered 2022-12-15 – 2023-01-03 (×16): 300 mg via ORAL
  Filled 2022-12-15 (×19): qty 2

## 2022-12-15 MED ORDER — ONDANSETRON HCL 4 MG PO TABS
4.0000 mg | ORAL_TABLET | Freq: Four times a day (QID) | ORAL | Status: DC | PRN
  Administered 2022-12-27: 4 mg via ORAL
  Filled 2022-12-15: qty 1

## 2022-12-15 MED ORDER — TRAZODONE HCL 100 MG PO TABS: 100.0000 mg | ORAL_TABLET | Freq: Every day | ORAL | Status: DC

## 2022-12-15 MED ORDER — PIPERACILLIN-TAZOBACTAM 3.375 G IVPB
3.3750 g | Freq: Three times a day (TID) | INTRAVENOUS | Status: DC
  Administered 2022-12-15 – 2022-12-16 (×2): 3.375 g via INTRAVENOUS
  Filled 2022-12-15 (×3): qty 50

## 2022-12-15 MED ORDER — VENETOCLAX 100 MG PO TABS: 300.0000 mg | ORAL_TABLET | Freq: Every day | ORAL | Status: DC

## 2022-12-15 MED ORDER — PSYLLIUM 95 % PO PACK
1.0000 | PACK | Freq: Every day | ORAL | Status: DC
  Filled 2022-12-15: qty 1

## 2022-12-15 MED ORDER — HYDROCODONE-ACETAMINOPHEN 5-325 MG PO TABS
1.0000 | ORAL_TABLET | ORAL | Status: DC | PRN
  Filled 2022-12-15: qty 1

## 2022-12-15 MED ORDER — COLESTIPOL HCL 1 G PO TABS
1.0000 g | ORAL_TABLET | Freq: Every day | ORAL | Status: DC
  Administered 2022-12-15 – 2023-01-02 (×13): 1 g via ORAL
  Filled 2022-12-15 (×20): qty 1

## 2022-12-15 MED ORDER — ASPIRIN 81 MG PO TBEC
81.0000 mg | DELAYED_RELEASE_TABLET | Freq: Every day | ORAL | Status: DC
  Administered 2022-12-15 – 2022-12-22 (×4): 81 mg via ORAL
  Filled 2022-12-15 (×6): qty 1

## 2022-12-15 MED ORDER — ACETAMINOPHEN 500 MG PO TABS
1000.0000 mg | ORAL_TABLET | Freq: Once | ORAL | Status: AC
  Administered 2022-12-15: 1000 mg via ORAL
  Filled 2022-12-15: qty 2

## 2022-12-15 MED ORDER — AZITHROMYCIN 250 MG PO TABS
500.0000 mg | ORAL_TABLET | Freq: Every day | ORAL | Status: DC
  Administered 2022-12-15: 500 mg via ORAL
  Filled 2022-12-15: qty 2

## 2022-12-15 MED ORDER — ACETAMINOPHEN 325 MG PO TABS
650.0000 mg | ORAL_TABLET | Freq: Four times a day (QID) | ORAL | Status: DC | PRN
  Administered 2022-12-15: 650 mg via ORAL
  Filled 2022-12-15: qty 2

## 2022-12-15 MED ORDER — ONDANSETRON HCL 4 MG/2ML IJ SOLN
4.0000 mg | Freq: Four times a day (QID) | INTRAMUSCULAR | Status: DC | PRN
  Administered 2022-12-18 – 2022-12-29 (×4): 4 mg via INTRAVENOUS
  Filled 2022-12-15 (×8): qty 2

## 2022-12-15 MED ORDER — PIPERACILLIN-TAZOBACTAM 3.375 G IVPB
3.3750 g | Freq: Once | INTRAVENOUS | Status: AC
  Administered 2022-12-15: 3.375 g via INTRAVENOUS
  Filled 2022-12-15: qty 50

## 2022-12-15 MED ORDER — APIXABAN 5 MG PO TABS
5.0000 mg | ORAL_TABLET | Freq: Two times a day (BID) | ORAL | Status: DC
  Administered 2022-12-15 – 2022-12-16 (×2): 5 mg via ORAL
  Filled 2022-12-15 (×2): qty 1

## 2022-12-15 NOTE — ED Provider Notes (Signed)
  Physical Exam  BP 117/73   Pulse 67   Temp 98.2 F (36.8 C) (Oral)   Resp 20   Ht 6' (1.829 m)   Wt 95.3 kg   SpO2 91%   BMI 28.48 kg/m   Physical Exam Vitals and nursing note reviewed.  Constitutional:      Appearance: Normal appearance.  Pulmonary:     Effort: Pulmonary effort is normal.  Abdominal:     Tenderness: There is abdominal tenderness. There is no guarding or rebound.     Comments: There is tenderness to palpation in the right lower quadrant  Neurological:     Mental Status: He is alert and oriented to person, place, and time.     Procedures  Procedures  ED Course / MDM    Medical Decision Making Amount and/or Complexity of Data Reviewed Labs: ordered. Radiology: ordered.  Risk OTC drugs. Prescription drug management. Decision regarding hospitalization.   Care assumed from Dr. Rubin Payor at shift change.  Patient awaiting results of CT scan of the abdomen and pelvis.  Briefly, this patient was recently admitted with enteritis at an outside facility.  He was discharged with antibiotics which she recently finished.  He is now having increased pain, fever, and disorientation.   CT scan has resulted and shows marked inflammatory changes of several loops of right mid abdominal small bowel and mesentery with differential diagnosis of small bowel diverticulitis versus enteritis versus developing small bowel obstruction with ischemia.  I attempted to consult Dr. Bedelia Person from general surgery, however she was busy in the operating room.  I did message her and make her aware of this patient.  Patient to be admitted to the hospitalist service for further care.  I have administered Zosyn here in the ER.       Geoffery Lyons, MD 12/15/22 [phone]

## 2022-12-15 NOTE — ED Notes (Signed)
Pt has been having bladder incontinence issues so was unable to give a urine sample. Pt had an order for in and out cath to collect urine for UA. Pt's brief was soiled and removed. Placed condom cath on Pt to be able to keep urine off of his skin.

## 2022-12-15 NOTE — ED Notes (Signed)
Patient resting quietly in stretcher, respirations even, unlabored, no acute distress noted. Denies needs at this time.  

## 2022-12-15 NOTE — Consult Note (Signed)
Surgical Evaluation Requesting provider: Dr. 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  He was discharged about 10 days ago and completed his oral antibiotics this past week on Tuesday.  About 3 days later on Friday he began complaining of recurring abdominal pain.  This has been associated with nausea, vomiting.  Some diarrhea but no observed melena or hematochezia. Last colonoscopy was in 2021 by Dr. Chales Abrahams; he had several polyps and some diverticulosis. Echo in our system was done in 2022, EF 55 to 60%.  There is 1 in [facility] from March 29 of this year which documents an EF of 50 to 55%.  Imaging from his recent hospital admission at [facility] is not accessible through [facility].  His last CT in [facility] was in November 2023 which notes several small retroperitoneal lymph nodes and subtle soft tissue density in the periceliac, [place] SMA region but no bowel obstruction or fluid collections.  Several small is mesenteric lymph nodes also noted.  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 Valentino Hue in [facility] hospital     Depression    GERD (gastroesophageal reflux disease)    controlled with nexium    Hypertension    Pacemaker     Past Surgical History:  Procedure Laterality Date   APPENDECTOMY     CARPAL TUNNEL  RELEASE     left    CHOLECYSTECTOMY N/A 04/20/2019   Procedure: LAPAROSCOPIC CHOLECYSTECTOMY;  Surgeon: [name], MD;  Location: [facility];  Service: General;  Laterality: N/A;   COLON SURGERY  2002   10 inches of colon taken out    COLONOSCOPY     ENDOSCOPIC RETROGRADE CHOLANGIOPANCREATOGRAPHY (ERCP) WITH PROPOFOL N/A 03/01/2019   Procedure: ENDOSCOPIC RETROGRADE CHOLANGIOPANCREATOGRAPHY (ERCP) WITH PROPOFOL;  Surgeon: [name]., MD;  Location: [facility] ENDOSCOPY;  Service: Gastroenterology;  Laterality: N/A;   ERCP  03/01/2019   HERNIA REPAIR     bilateral inguinal    IR CHOLANGIOGRAM EXISTING TUBE  03/25/2019   IR PERC CHOLECYSTOSTOMY  03/02/2019   left heel reconstruction  01/2002   17 screws and 2 plates    PACEMAKER INSERTION     POLYPECTOMY     REMOVAL OF STONES  03/01/2019   Procedure: REMOVAL OF STONES;  Surgeon: [name]., MD;  Location: [facility] ENDOSCOPY;  Service: Gastroenterology;;   [name]  03/01/2019   Procedure: [name];  Surgeon: Mansouraty, Netty Starring., MD;  Location: [facility] ENDOSCOPY;  Service: Gastroenterology;;   tooth extracted     with laughing gas    UPPER GASTROINTESTINAL ENDOSCOPY      Family History  Problem Relation Age of Onset   Prostate cancer Brother    Colon cancer Neg Hx    Esophageal cancer Neg Hx    Colon polyps Neg Hx  Rectal cancer Neg Hx    Stomach cancer Neg Hx     Social History   Socioeconomic History   Marital status: Married    Spouse name: Not on file   Number of children: Not on file   Years of education: Not on file   Highest education level: Not on file  Occupational History   Not on file  Tobacco Use   Smoking status: Never   Smokeless tobacco: Never  Vaping Use   Vaping Use: Never used  Substance and Sexual Activity   Alcohol use: Yes    Comment: 1 drink before supper   Drug use: No   Sexual activity: Yes  Other Topics Concern   Not on file  Social History Narrative   Right  handed    Lives with wife   Drinks caffeine    Two story home   Social Determinants of Health   Financial Resource Strain: Not on file  Food Insecurity: Not on file  Transportation Needs: Not on file  Physical Activity: Not on file  Stress: Not on file  Social Connections: Not on file    No current facility-administered medications on file prior to encounter.   Current Outpatient Medications on File Prior to Encounter  Medication Sig Dispense Refill   apixaban (ELIQUIS) 5 MG TABS tablet Take by mouth.     budesonide (PULMICORT) 0.5 MG/2ML nebulizer solution Inhale into the lungs.     cefdinir (OMNICEF) 300 MG capsule Take by mouth.     metoprolol tartrate (LOPRESSOR) 50 MG tablet Take 50 mg by mouth 2 (two) times daily.     sucralfate (CARAFATE) 1 g tablet Take by mouth.     albuterol (VENTOLIN HFA) 108 (90 Base) MCG/ACT inhaler Inhale 2 puffs into the lungs every 6 (six) hours as needed for wheezing or shortness of breath. 18 g 0   amLODipine (NORVASC) 10 MG tablet Take 1 tablet (10 mg total) by mouth daily. 30 tablet 1   aspirin EC 81 MG EC tablet Take 1 tablet (81 mg total) by mouth daily. Swallow whole. 30 tablet 0   atorvastatin (LIPITOR) 20 MG tablet Take 20 mg by mouth daily.     budesonide-formoterol (SYMBICORT) 80-4.5 MCG/ACT inhaler Inhale 2 puffs into the lungs in the morning and at bedtime. 1 each 0   buPROPion (WELLBUTRIN XL) 300 MG 24 hr tablet Take 1 tablet (300 mg total) by mouth daily. 30 tablet 0   cholecalciferol (VITAMIN D3) 25 MCG (1000 UNIT) tablet Take 2 tablets (2,000 Units total) by mouth daily. Takes 2 tablets (1,000 units each) for a total of 2,000 units daily     colestipol (COLESTID) 1 g tablet Start 1 gram at night by mouth for 3 nights then increase to 2 grams at night up to 2 grams twice daily as needed to control diarrhea     diphenoxylate-atropine (LOMOTIL) 2.5-0.025 MG tablet Take by mouth.     docusate sodium (COLACE) 100 MG capsule Take 1 capsule  (100 mg total) by mouth 2 (two) times daily. 60 capsule 0   donepezil (ARICEPT) 5 MG tablet Take 1 tablet (5 mg total) by mouth daily. 90 tablet 3   obinutuzumab 1,000 mg in sodium chloride 0.9 % 250 mL Inject 1,000 mg into the vein.     ondansetron (ZOFRAN) 8 MG tablet Take 8 mg by mouth every 8 (eight) hours as needed.     pantoprazole (PROTONIX) 40 MG tablet Take by mouth.  potassium chloride (KLOR-CON) 10 MEQ tablet Take by mouth.     sertraline (ZOLOFT) 25 MG tablet Take 25 mg by mouth daily.     traZODone (DESYREL) 100 MG tablet Take 1 tablet (100 mg total) by mouth at bedtime. 10 tablet 0   VENETOCLAX PO Take 300 mg by mouth daily.      Review of Systems: a complete, 10pt review of systems was completed with pertinent positives and negatives as documented in the HPI  Physical Exam: Vitals:   12/15/22 0748 12/15/22 0848  BP:    Pulse:    Resp:    Temp: (!) 97.5 F (36.4 C)   SpO2: 93% 93%   Gen: Sleeping comfortably but awakens with voice.  Wife at bedside. Eyes: lids and conjunctivae normal, no icterus. Pupils equally round and reactive to light.  Neck: supple without mass or thyromegaly Chest: respiratory effort is normal. No crepitus or tenderness on palpation of the chest. Breath sounds equal.  Cardiovascular: RRR with palpable distal pulses, no pedal edema Gastrointestinal: soft, mildly distended, diffusely mildly tender without any guarding or mass.  No peritoneal signs. Muscoloskeletal: no clubbing or cyanosis of the fingers.  Strength is symmetrical throughout.  Range of motion of bilateral upper and lower extremities normal without pain, crepitation or contracture. Neuro: cranial nerves grossly intact.  Sensation intact to light touch diffusely. Psych: appropriate mood and affect, normal insight/judgment intact  Skin: warm and dry      Latest Ref Rng & Units 12/14/2022   10:23 PM 07/30/2022   10:50 AM 11/04/2021   10:47 AM  CBC  WBC 4.0 - 10.5 K/uL 11.0  5.7   4.8   Hemoglobin 13.0 - 17.0 g/dL 40.9  81.1  91.4   Hematocrit 39.0 - 52.0 % 36.7  35.1  42.8   Platelets 150 - 400 K/uL 221  100  149        Latest Ref Rng & Units 12/14/2022   10:23 PM 07/30/2022   10:50 AM 11/04/2021   10:47 AM  CMP  Glucose 70 - 99 mg/dL 782  956  213   BUN 8 - 23 mg/dL 12  12  10    Creatinine 0.61 - 1.24 mg/dL 0.86  5.78  4.69   Sodium 135 - 145 mmol/L 139  136  139   Potassium 3.5 - 5.1 mmol/L 4.4  3.6  3.2   Chloride 98 - 111 mmol/L 103  101  106   CO2 22 - 32 mmol/L 29  29  23    Calcium 8.9 - 10.3 mg/dL 8.8  8.8  9.1   Total Protein 6.5 - 8.1 g/dL 5.9  6.5  6.8   Total Bilirubin 0.3 - 1.2 mg/dL 0.7  1.0  1.0   Alkaline Phos 38 - 126 U/L 120  194  134   AST 15 - 41 U/L 25  66  22   ALT 0 - 44 U/L 12  90  43     Lab Results  Component Value Date   INR 1.1 11/24/2020   INR 1.0 05/10/2019   INR 1.1 03/01/2019    Imaging: CT ABDOMEN PELVIS W CONTRAST  Addendum Date: 12/15/2022   ADDENDUM REPORT: 12/15/2022 00:23 ADDENDUM: These results were called by telephone at the time of interpretation on 12/15/2022 at 12:23 am to provider Dr. Judd Lien, who verbally acknowledged these results. Electronically Signed   By: Tish Frederickson M.D.   On: 12/15/2022 00:23   Result Date: 12/15/2022 CLINICAL DATA:  Abdominal pain, acute, nonlocalized EXAM: CT ABDOMEN AND PELVIS WITH CONTRAST TECHNIQUE: Multidetector CT imaging of the abdomen and pelvis was performed using the standard protocol following bolus administration of intravenous contrast. RADIATION DOSE REDUCTION: This exam was performed according to the departmental dose-optimization program which includes automated exposure control, adjustment of the mA and/or kV according to patient size and/or use of iterative reconstruction technique. CONTRAST:  85mL OMNIPAQUE IOHEXOL 300 MG/ML  SOLN COMPARISON:  CT abdomen pelvis 05/13/2019. FINDINGS: Lower chest: Cardiac leads partially visualized. Hepatobiliary: No focal liver  abnormality. Status post cholecystectomy. No biliary dilatation. Pancreas: Diffusely atrophic. No focal lesion. Otherwise normal pancreatic contour. No surrounding inflammatory changes. No main pancreatic ductal dilatation. Spleen: Normal in size without focal abnormality. Adrenals/Urinary Tract: No adrenal nodule bilaterally. Bilateral kidneys enhance symmetrically. No hydronephrosis. No hydroureter. The urinary bladder is unremarkable. On delayed imaging, there is no urothelial wall thickening and there are no filling defects in the opacified portions of the bilateral collecting systems or ureters. Stomach/Bowel: Sigmoid resection and appendectomy. Stomach is within normal limits. No evidence of large bowel wall thickening or dilatation. Multiple loops of adjacent small bowel within the right mid abdomen demonstrating bowel wall thickening, hazy bowel wall, and marked mesenteric edema. No definite transition point identified. No pneumatosis. The small bowel it is dilated up to 4 cm. Vascular/Lymphatic: No abdominal aorta or iliac aneurysm. Mild atherosclerotic plaque of the aorta and its branches. No abdominal, pelvic, or inguinal lymphadenopathy. Reproductive: Prostate is unremarkable. Other: No intraperitoneal free fluid. No intraperitoneal free gas. No organized fluid collection. Musculoskeletal: No abdominal wall hernia or abnormality. No suspicious lytic or blastic osseous lesions. No acute displaced fracture. Intervertebral disc space narrowing at the L5-S1 level with associated grade 1 anterolisthesis of L5 on S1. IMPRESSION: 1. Marked inflammatory changes of several loops of right mid abdominal small bowel and mesentery. Differential diagnosis includes small bowel diverticulitis versus enteritis versus developing small bowel obstruction with ischemia. Contained perforation is not excluded. No definite free intraperitoneal gas. 2.  Aortic Atherosclerosis (ICD10-I70.0). Electronically Signed: By: Tish Frederickson M.D. On: 12/15/2022 00:14   CT Head Wo Contrast  Result Date: 12/15/2022 CLINICAL DATA:  Delirium EXAM: CT HEAD WITHOUT CONTRAST TECHNIQUE: Contiguous axial images were obtained from the base of the skull through the vertex without intravenous contrast. RADIATION DOSE REDUCTION: This exam was performed according to the departmental dose-optimization program which includes automated exposure control, adjustment of the mA and/or kV according to patient size and/or use of iterative reconstruction technique. COMPARISON:  CT head 06/20/2022 FINDINGS: Brain: Cerebral ventricle sizes are concordant with the degree of cerebral volume loss. Patchy and confluent areas of decreased attenuation are noted throughout the deep and periventricular white matter of the cerebral hemispheres bilaterally, compatible with chronic microvascular ischemic disease. No evidence of large-territorial acute infarction. No parenchymal hemorrhage. No mass lesion. No extra-axial collection. No mass effect or midline shift. No hydrocephalus. Basilar cisterns are patent. Vascular: No hyperdense vessel. Skull: No acute fracture or focal lesion. Sinuses/Orbits: Right maxillary sinus mucosal thickening. Paranasal sinuses and mastoid air cells are clear. The orbits are unremarkable. Other: None. IMPRESSION: No acute intracranial abnormality. Electronically Signed   By: Tish Frederickson M.D.   On: 12/15/2022 00:16   DG Chest Portable 1 View  Result Date: 12/14/2022 CLINICAL DATA:  Fevers EXAM: PORTABLE CHEST 1 VIEW COMPARISON:  09/07/2022 FINDINGS: Cardiac shadow is stable. Pacing device is now seen in satisfactory position. Lungs are clear. No bony abnormality is noted. IMPRESSION: No  acute abnormality noted. Electronically Signed   By: Alcide Clever M.D.   On: 12/14/2022 22:36     A/P: 72 year old male who has been suffering with some form of enteritis for a few weeks now, discharged from [facility] in [facility] about 10  days ago with recurrent pain began a few days after completing his antibiotic course.  Currently he is afebrile, his white count is minimally elevated at 11, no acidosis or acute kidney injury present at this time.  Lactate is normal.  Fairly benign abdominal exam.  I have reviewed his CT report and images personally and agree with findings of inflammatory changes surrounding multiple loops of small bowel and mesentery in the right hemiabdomen.  Suspicion for ischemia is low given the above but this may represent contained small bowel diverticulitis, infectious etiology or perhaps malignant etiology.  At this point would recommend resuscitation, continue broad-spectrum antibiotics and bowel rest.  Blood Cultures, GI panel and C. Diff are pending. I have made him NPO except sips and dc/d the metamucil for now. He may benefit from exploration this admission depending on progress.  Surgery team will follow closely.    Patient Active Problem List   Diagnosis Date Noted   Enteritis 12/15/2022   Acute ischemic VBA thalamic stroke, right (HCC) 11/24/2020   Mild intermittent asthma 01/28/2020   Dyspnea 05/11/2019   Thrombocytosis 05/11/2019   Alcohol dependence (HCC) 05/11/2019   Abnormal findings on diagnostic imaging of gall bladder 05/10/2019   Essential hypertension 04/22/2019   Post-operative complication 04/21/2019   Acute respiratory failure with hypoxia (HCC) 04/21/2019   SIRS (systemic inflammatory response syndrome) (HCC) 04/21/2019   Status post laparoscopic cholecystectomy 04/21/2019   HCAP (healthcare-associated pneumonia) 04/21/2019   CLL (chronic lymphocytic leukemia) (HCC) 04/21/2019   Calculous cholecystitis 02/28/2019   Campylobacter diarrhea    Diarrhea of infectious origin    STEC (Shiga toxin-producing Escherichia coli)    Elevated LFTs    Hypokalemia    Gastroenteritis 01/19/2017   Sepsis (HCC) 01/19/2017   GERD (gastroesophageal reflux disease) 01/19/2017   Depression  01/19/2017       Phylliss Blakes, MD [facility]  See AMION to contact appropriate on-call provider

## 2022-12-15 NOTE — ED Notes (Signed)
Report called to Kathlene November, RN on accepting unit. [facility] en route.

## 2022-12-15 NOTE — Plan of Care (Signed)
  Problem: Education: Goal: Knowledge of General Education information will improve Description: Including pain rating scale, medication(s)/side effects and non-pharmacologic comfort measures 12/15/2022 2251 by Orson Ape, RN Outcome: Progressing 12/15/2022 2251 by Orson Ape, RN Outcome: Progressing   Problem: Health Behavior/Discharge Planning: Goal: Ability to manage health-related needs will improve 12/15/2022 2251 by Orson Ape, RN Outcome: Progressing 12/15/2022 2251 by Orson Ape, RN Outcome: Progressing   Problem: Clinical Measurements: Goal: Ability to maintain clinical measurements within normal limits will improve 12/15/2022 2251 by Orson Ape, RN Outcome: Progressing 12/15/2022 2251 by Orson Ape, RN Outcome: Progressing Goal: Will remain free from infection 12/15/2022 2251 by Orson Ape, RN Outcome: Progressing 12/15/2022 2251 by Orson Ape, RN Outcome: Progressing Goal: Diagnostic test results will improve 12/15/2022 2251 by Orson Ape, RN Outcome: Progressing 12/15/2022 2251 by Orson Ape, RN Outcome: Progressing Goal: Respiratory complications will improve 12/15/2022 2251 by Orson Ape, RN Outcome: Progressing 12/15/2022 2251 by Orson Ape, RN Outcome: Progressing Goal: Cardiovascular complication will be avoided 12/15/2022 2251 by Orson Ape, RN Outcome: Progressing 12/15/2022 2251 by Orson Ape, RN Outcome: Progressing   Problem: Activity: Goal: Risk for activity intolerance will decrease 12/15/2022 2251 by Orson Ape, RN Outcome: Progressing 12/15/2022 2251 by Orson Ape, RN Outcome: Progressing   Problem: Nutrition: Goal: Adequate nutrition will be maintained Outcome: Progressing   Problem: Coping: Goal: Level of anxiety will decrease Outcome: Progressing   Problem: Elimination: Goal: Will not experience complications related to bowel  motility Outcome: Progressing Goal: Will not experience complications related to urinary retention Outcome: Progressing   Problem: Pain Managment: Goal: General experience of comfort will improve Outcome: Progressing   Problem: Safety: Goal: Ability to remain free from injury will improve Outcome: Progressing   Problem: Skin Integrity: Goal: Risk for impaired skin integrity will decrease Outcome: Progressing

## 2022-12-15 NOTE — H&P (Addendum)
History and Physical  Jonathon Parker [facility] HQI:[id number] DOB: 1950/08/20 DOA: 12/14/2022  PCP: Paulina Fusi, MD Patient coming from: home  I have personally briefly reviewed patient's old medical records in [facility]   Chief Complaint: Fevers abdominal pain and confusion  HPI: Con Jonathon Parker is a 72 y.o. male past medical history significant of CLL not on treatment, Paroxysmal a. Fib. on Eliquis, history of colon cancer status post left hemicolectomy little bit more than 10 years ago, GERD who was recently seen at an outside facility [facility] which I cannot see on care everywhere) about 10 days prior to admission who was diagnosed with enteritis and sepsis discharged about 10 days ago with antibiotics brought in by the family with worsening abdominal pain and confusion.  Wife relates that 3 days prior to admission he started having abdominal pain is watery stools continued after he finishes antibiotic treatment 3 days prior to admission.  Started getting confused complaining of abdominal pain she relates he had subjective fever.  Wife and patient denied any blood in stools.  In the ED: Was found to have a leukocytosis start empirically on IV Zosyn, UA was unremarkable except for a specific gravity of 1046.  CT scan of the abdomen pelvis showed inflammatory changes of several bowels right mid abdominal small bowel and mesentery  Review of Systems: All systems reviewed and apart from history of presenting illness, are negative.  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 Valentino Hue in [facility]     Depression    GERD (gastroesophageal reflux disease)    controlled with nexium    Hypertension    Pacemaker    Past Surgical History:  Procedure Laterality Date   APPENDECTOMY     CARPAL TUNNEL RELEASE      left    CHOLECYSTECTOMY N/A 04/20/2019   Procedure: LAPAROSCOPIC CHOLECYSTECTOMY;  Surgeon: [name], MD;  Location: [facility] ORS;  Service: General;  Laterality: N/A;   COLON SURGERY  2002   10 inches of colon taken out    COLONOSCOPY     ENDOSCOPIC RETROGRADE CHOLANGIOPANCREATOGRAPHY (ERCP) WITH PROPOFOL N/A 03/01/2019   Procedure: ENDOSCOPIC RETROGRADE CHOLANGIOPANCREATOGRAPHY (ERCP) WITH PROPOFOL;  Surgeon: [name]., MD;  Location: [facility] ENDOSCOPY;  Service: Gastroenterology;  Laterality: N/A;   ERCP  03/01/2019   HERNIA REPAIR     bilateral inguinal    IR CHOLANGIOGRAM EXISTING TUBE  03/25/2019   IR PERC CHOLECYSTOSTOMY  03/02/2019   left heel reconstruction  01/2002   17 screws and 2 plates    PACEMAKER INSERTION     POLYPECTOMY     REMOVAL OF STONES  03/01/2019   Procedure: REMOVAL OF STONES;  Surgeon: [name]., MD;  Location: [facility] ENDOSCOPY;  Service: Gastroenterology;;   [name]  03/01/2019   Procedure: [name];  Surgeon: Mansouraty, Netty Starring., MD;  Location: [facility] ENDOSCOPY;  Service: Gastroenterology;;   tooth extracted     with laughing gas    UPPER GASTROINTESTINAL ENDOSCOPY     Social History:  reports that he has never smoked. He has never used smokeless tobacco. He reports current alcohol use. He reports that he does not use drugs.   Allergies  Allergen Reactions   Lisinopril Cough    Family History  Problem Relation Age of Onset   Prostate cancer Brother    Colon cancer Neg Hx    Esophageal cancer Neg Hx    Colon polyps Neg Hx    Rectal cancer Neg Hx    Stomach cancer Neg Hx     Prior to Admission medications   Medication Sig Start Date End Date Taking? Authorizing Provider  albuterol (VENTOLIN HFA) 108 (90 Base) MCG/ACT inhaler Inhale 2 puffs into the lungs every 6 (six) hours as needed for wheezing or shortness of breath. 11/26/20   Mathews Argyle, NP  amLODipine (NORVASC) 10 MG tablet Take 1 tablet (10 mg total) by  mouth daily. 11/26/20   Mathews Argyle, NP  aspirin EC 81 MG EC tablet Take 1 tablet (81 mg total) by mouth daily. Swallow whole. 11/27/20   Mathews Argyle, NP  atorvastatin (LIPITOR) 20 MG tablet Take 20 mg by mouth daily. 03/22/21   [provider]  budesonide-formoterol (SYMBICORT) 80-4.5 MCG/ACT inhaler Inhale 2 puffs into the lungs in the morning and at bedtime. 11/26/20   Mathews Argyle, NP  buPROPion (WELLBUTRIN XL) 300 MG 24 hr tablet Take 1 tablet (300 mg total) by mouth daily. 11/26/20   Mathews Argyle, NP  cholecalciferol (VITAMIN D3) 25 MCG (1000 UNIT) tablet Take 2 tablets (2,000 Units total) by mouth daily. Takes 2 tablets (1,000 units each) for a total of 2,000 units daily 11/26/20   Mathews Argyle, NP  colestipol (COLESTID) 1 g tablet Start 1 gram at night by mouth for 3 nights then increase to 2 grams at night up to 2 grams twice daily as needed to control diarrhea 11/20/21   [provider]  diphenoxylate-atropine (LOMOTIL) 2.5-0.025 MG tablet Take by mouth. 11/02/21   [provider]  docusate sodium (COLACE) 100 MG capsule Take 1 capsule (100 mg total) by mouth 2 (two) times daily. 11/26/20   Mathews Argyle, NP  donepezil (ARICEPT) 5 MG tablet Take 1 tablet (5 mg total) by mouth daily. 12/07/21   Van Clines, MD  obinutuzumab 1,000 mg in sodium chloride 0.9 % 250 mL Inject 1,000 mg into the vein.    [provider]  ondansetron (ZOFRAN) 8 MG tablet Take 8 mg by mouth every 8 (eight) hours as needed. 10/31/21   [provider]  pantoprazole (PROTONIX) 40 MG tablet Take by mouth. 10/31/21   [provider]  potassium chloride (KLOR-CON) 10 MEQ tablet Take by mouth. 11/15/21   [provider]  sertraline (ZOLOFT) 25 MG tablet Take 25 mg by mouth daily. 02/27/21   [provider]  traZODone (DESYREL) 100 MG tablet Take 1 tablet (100 mg total) by mouth at bedtime. 11/26/20   Mathews Argyle, NP  VENETOCLAX PO Take 300 mg  by mouth daily.    [provider]   Physical Exam: Vitals:   12/15/22 0430 12/15/22 0555 12/15/22 0600 12/15/22 0748  BP: 97/64 93/63    Pulse: (!) 59     Resp: 18 18    Temp:  (!) 97.5 F (36.4 C)  (!) 97.5 F (36.4 C)  TempSrc:  Oral  Oral  SpO2: 93% 93%  93%  Weight:   94.6 kg   Height:        General exam: Moderately built and nourished patient, lying comfortably supine on the gurney in no obvious distress. Head, eyes and ENT: Nontraumatic and normocephalic.  Moist mucous membrane Neck: Supple. No JVD, carotid bruit or  thyromegaly. Lymphatics: No lymphadenopathy. Respiratory system: Clear to auscultation. No increased work of breathing. Cardiovascular system: S1 and S2 heard, RRR. No JVD. Gastrointestinal system: Positive bowel sounds, soft left and right lower quadrant tenderness. Central nervous system: Alert and oriented. No focal neurological deficits. Extremities: Symmetric 5 x 5 power. Peripheral pulses symmetrically felt.  Skin: No rashes or acute findings. Musculoskeletal system: Negative exam. Psychiatry: Pleasant and cooperative.   Labs on Admission:  Basic Metabolic Panel: Recent Labs  Lab 12/14/22 2223  NA 139  K 4.4  CL 103  CO2 29  GLUCOSE 141*  BUN 12  CREATININE 1.00  CALCIUM 8.8*   Liver Function Tests: Recent Labs  Lab 12/14/22 2223  AST 25  ALT 12  ALKPHOS 120  BILITOT 0.7  PROT 5.9*  ALBUMIN 3.7   Recent Labs  Lab 12/14/22 2223  LIPASE 24   No results for input(s): "AMMONIA" in the last 168 hours. CBC: Recent Labs  Lab 12/14/22 2223  WBC 11.0*  HGB 12.2*  HCT 36.7*  MCV 101.9*  PLT 221   Cardiac Enzymes: No results for input(s): "CKTOTAL", "CKMB", "CKMBINDEX", "TROPONINI" in the last 168 hours.  BNP (last 3 results) No results for input(s): "PROBNP" in the last 8760 hours. CBG: Recent Labs  Lab 12/14/22 2132  GLUCAP 141*    Radiological Exams on Admission: CT ABDOMEN PELVIS W CONTRAST  Addendum  Date: 12/15/2022   ADDENDUM REPORT: 12/15/2022 00:23 ADDENDUM: These results were called by telephone at the time of interpretation on 12/15/2022 at 12:23 am to provider Dr. Judd Lien, who verbally acknowledged these results. Electronically Signed   By: Tish Frederickson M.D.   On: 12/15/2022 00:23   Result Date: 12/15/2022 CLINICAL DATA:  Abdominal pain, acute, nonlocalized EXAM: CT ABDOMEN AND PELVIS WITH CONTRAST TECHNIQUE: Multidetector CT imaging of the abdomen and pelvis was performed using the standard protocol following bolus administration of intravenous contrast. RADIATION DOSE REDUCTION: This exam was performed according to the departmental dose-optimization program which includes automated exposure control, adjustment of the mA and/or kV according to patient size and/or use of iterative reconstruction technique. CONTRAST:  85mL OMNIPAQUE IOHEXOL 300 MG/ML  SOLN COMPARISON:  CT abdomen pelvis 05/13/2019. FINDINGS: Lower chest: Cardiac leads partially visualized. Hepatobiliary: No focal liver abnormality. Status post cholecystectomy. No biliary dilatation. Pancreas: Diffusely atrophic. No focal lesion. Otherwise normal pancreatic contour. No surrounding inflammatory changes. No main pancreatic ductal dilatation. Spleen: Normal in size without focal abnormality. Adrenals/Urinary Tract: No adrenal nodule bilaterally. Bilateral kidneys enhance symmetrically. No hydronephrosis. No hydroureter. The urinary bladder is unremarkable. On delayed imaging, there is no urothelial wall thickening and there are no filling defects in the opacified portions of the bilateral collecting systems or ureters. Stomach/Bowel: Sigmoid resection and appendectomy. Stomach is within normal limits. No evidence of large bowel wall thickening or dilatation. Multiple loops of adjacent small bowel within the right mid abdomen demonstrating bowel wall thickening, hazy bowel wall, and marked mesenteric edema. No definite transition point  identified. No pneumatosis. The small bowel it is dilated up to 4 cm. Vascular/Lymphatic: No abdominal aorta or iliac aneurysm. Mild atherosclerotic plaque of the aorta and its branches. No abdominal, pelvic, or inguinal lymphadenopathy. Reproductive: Prostate is unremarkable. Other: No intraperitoneal free fluid. No intraperitoneal free gas. No organized fluid collection. Musculoskeletal: No abdominal wall hernia or abnormality. No suspicious lytic or blastic osseous lesions. No acute displaced fracture. Intervertebral disc space narrowing at the L5-S1 level with associated grade 1 anterolisthesis of L5 on S1. IMPRESSION:  1. Marked inflammatory changes of several loops of right mid abdominal small bowel and mesentery. Differential diagnosis includes small bowel diverticulitis versus enteritis versus developing small bowel obstruction with ischemia. Contained perforation is not excluded. No definite free intraperitoneal gas. 2.  Aortic Atherosclerosis (ICD10-I70.0). Electronically Signed: By: Tish Frederickson M.D. On: 12/15/2022 00:14   CT Head Wo Contrast  Result Date: 12/15/2022 CLINICAL DATA:  Delirium EXAM: CT HEAD WITHOUT CONTRAST TECHNIQUE: Contiguous axial images were obtained from the base of the skull through the vertex without intravenous contrast. RADIATION DOSE REDUCTION: This exam was performed according to the departmental dose-optimization program which includes automated exposure control, adjustment of the mA and/or kV according to patient size and/or use of iterative reconstruction technique. COMPARISON:  CT head 06/20/2022 FINDINGS: Brain: Cerebral ventricle sizes are concordant with the degree of cerebral volume loss. Patchy and confluent areas of decreased attenuation are noted throughout the deep and periventricular white matter of the cerebral hemispheres bilaterally, compatible with chronic microvascular ischemic disease. No evidence of large-territorial acute infarction. No parenchymal  hemorrhage. No mass lesion. No extra-axial collection. No mass effect or midline shift. No hydrocephalus. Basilar cisterns are patent. Vascular: No hyperdense vessel. Skull: No acute fracture or focal lesion. Sinuses/Orbits: Right maxillary sinus mucosal thickening. Paranasal sinuses and mastoid air cells are clear. The orbits are unremarkable. Other: None. IMPRESSION: No acute intracranial abnormality. Electronically Signed   By: Tish Frederickson M.D.   On: 12/15/2022 00:16   DG Chest Portable 1 View  Result Date: 12/14/2022 CLINICAL DATA:  Fevers EXAM: PORTABLE CHEST 1 VIEW COMPARISON:  09/07/2022 FINDINGS: Cardiac shadow is stable. Pacing device is now seen in satisfactory position. Lungs are clear. No bony abnormality is noted. IMPRESSION: No acute abnormality noted. Electronically Signed   By: Alcide Clever M.D.   On: 12/14/2022 22:36    EKG: Independently reviewed.  Sinus rhythm normal axis nonspecific T wave changes  Assessment/Plan Sepsis probably due to enteritis: Started empirically on IV Zosyn in the ED.  Wife and patient denies any bloody stools. He relates fevers, and an immunocompromise patient greater than 98 years old all of these are risk factors. He has recently placed on antibiotics, Protonix and is currently on immunotherapy which puts him at high risk of C. difficile. Placed on contact precautions. GI panel, check C. difficile, discontinue Protonix. Started on Metamucil, avoid dairy products in his diet. Blood cultures have been ordered by the ED Continue fluids, monitor strict I's and O's. Check a CBC with differential and a basic metabolic panel tomorrow morning. Check orthostatics. General surgery consulted by the ED low suspicion for ischemic colitis.  Acute metabolic encephalopathy: But due to infectious etiology CT scan of the head is negative, resume home meds.  Allow a diet.  Paroxismal A. FIB:  Cont Eliquis and metoprolol.  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 He is at high risk of aspiration and acute confusional state, especially sundowning. Use melatonin at night Seroquel as needed.  CLL (chronic lymphocytic leukemia) (HCC) Continue current medications.  MAcrocytic anemia: Noted follow-up with PCP as an outpatient.   DVT Prophylaxis: lovenox Code Status: Full  Family Communication: wife  Disposition Plan: inpatient    It is my clinical opinion that admission to INPATIENT is reasonable and necessary in this 72 y.o. male past medical history of recently being treated with antibiotics for enteritis, CLL and immunotherapy comes in for recurrent abdominal pain and watery bowel movement. Given the aforementioned, the  predictability of an adverse outcome is felt to be significant. I expect that the patient will require at least 2 midnights in the hospital to treat this condition.  Marinda Elk MD [facility]   12/15/2022, 8:50 AM

## 2022-12-15 NOTE — Progress Notes (Signed)
Pt ordered NPO, received lunch tray that was supposed to be delivered to a different room. Will cont to monitor and assess

## 2022-12-15 NOTE — ED Notes (Signed)
[facility] here for patient transport. 

## 2022-12-15 NOTE — ED Notes (Signed)
Per wife, patient became confused, pulled off condom catheter to attempt to urinate in urinal. Wife was able to re-direct patient. Condom catheter replaced with Zella Ball, RN.

## 2022-12-16 ENCOUNTER — Inpatient Hospital Stay (HOSPITAL_COMMUNITY): Payer: Medicare PPO

## 2022-12-16 DIAGNOSIS — K529 Noninfective gastroenteritis and colitis, unspecified: Secondary | ICD-10-CM | POA: Diagnosis not present

## 2022-12-16 LAB — GASTROINTESTINAL PANEL BY PCR, STOOL (REPLACES STOOL CULTURE)

## 2022-12-16 LAB — COMPREHENSIVE METABOLIC PANEL
ALT: 15 U/L (ref 0–44)
AST: 12 U/L — ABNORMAL LOW (ref 15–41)
Albumin: 2.5 g/dL — ABNORMAL LOW (ref 3.5–5.0)
Alkaline Phosphatase: 80 U/L (ref 38–126)
Anion gap: 5 (ref 5–15)
BUN: 9 mg/dL (ref 8–23)
CO2: 24 mmol/L (ref 22–32)
Calcium: 8 mg/dL — ABNORMAL LOW (ref 8.9–10.3)
Chloride: 105 mmol/L (ref 98–111)
Creatinine, Ser: 0.92 mg/dL (ref 0.61–1.24)
GFR, Estimated: >60 mL/min (ref >60)
Glucose, Bld: 99 mg/dL (ref 70–99)
Potassium: 3.9 mmol/L (ref 3.5–5.1)
Sodium: 134 mmol/L — ABNORMAL LOW (ref 135–145)
Total Bilirubin: 1.3 mg/dL — ABNORMAL HIGH (ref 0.3–1.2)
Total Protein: 4.8 g/dL — ABNORMAL LOW (ref 6.5–8.1)

## 2022-12-16 LAB — CBC
HCT: 29.6 % — ABNORMAL LOW (ref 39.0–52.0)
Hemoglobin: 9.6 g/dL — ABNORMAL LOW (ref 13.0–17.0)
MCH: 33.6 pg (ref 26.0–34.0)
MCHC: 32.4 g/dL (ref 30.0–36.0)
MCV: 103.5 fL — ABNORMAL HIGH (ref 80.0–100.0)
Platelets: 143 10*3/uL — ABNORMAL LOW (ref 150–400)
RBC: 2.86 MIL/uL — ABNORMAL LOW (ref 4.22–5.81)
RDW: 12.7 % (ref 11.5–15.5)
WBC: 5.1 10*3/uL (ref 4.0–10.5)
nRBC: 0 % (ref 0.0–0.2)

## 2022-12-16 MED ORDER — IOHEXOL 350 MG/ML SOLN
100.0000 mL | Freq: Once | INTRAVENOUS | Status: AC | PRN
  Administered 2022-12-16: 100 mL via INTRAVENOUS

## 2022-12-16 MED ORDER — PIPERACILLIN-TAZOBACTAM 3.375 G IVPB
3.3750 g | Freq: Three times a day (TID) | INTRAVENOUS | Status: DC
  Administered 2022-12-16 – 2022-12-19 (×9): 3.375 g via INTRAVENOUS
  Filled 2022-12-16 (×13): qty 50

## 2022-12-16 MED ORDER — SODIUM CHLORIDE 0.9 % IV SOLN
1.5000 g | Freq: Four times a day (QID) | INTRAVENOUS | Status: DC
  Administered 2022-12-16 (×2): 1.5 g via INTRAVENOUS
  Filled 2022-12-16 (×4): qty 4

## 2022-12-16 NOTE — Progress Notes (Addendum)
PROGRESS NOTE    [facility]  OZD:[id number] DOB: [birth year] DOA: 12/14/2022 PCP: Paulina Fusi, MD     Brief Narrative:   From admission h and p Jonathon Parker is a 72 y.o. male past medical history significant of CLL not on treatment, Paroxysmal a. Fib. on Eliquis, history of colon cancer status post left hemicolectomy little bit more than 10 years ago, GERD who was recently seen at an outside facility [facility] which I cannot see on care everywhere) about 10 days prior to admission who was diagnosed with enteritis and sepsis discharged about 10 days ago with antibiotics brought in by the family with worsening abdominal pain and confusion.  Wife relates that 3 days prior to admission he started having abdominal pain is watery stools continued after he finishes antibiotic treatment 3 days prior to admission.  Started getting confused complaining of abdominal pain she relates he had subjective fever.  Wife and patient denied any blood in stools.    Assessment & Plan:   Principal Problem:   Enteritis Active Problems:   Sepsis (HCC)   Depression   CLL (chronic lymphocytic leukemia) (HCC)   Enteritis Reports years of postprandial abdominal pain, now one month worsening pain. Treated at outside hospital two weeks ago for enteritis and completed a course of abx but pain persists, also with loose stools and one episode vomiting. Here CT shows inflammatory changes of several loops of right mid abdominal small bowel and mesentery. Ddx diverticulitis, enteritis, obstruction. Hemodynamically stable. C diff and gi pathogen panel negative. No acute abdomen, labs are reassuring. Gen surg following - advancing diet - surgery still an option - will check CTA of abdomen to eval for ischemia   Acute metabolic encephalopathy: Appears resolved   Paroxismal A. FIB:  Cont metoprolol. Holding apixaban   Depression: Continue Wellbutrin   Dementia without behavioral  disturbances: - cont home aricept, seroquel   CLL (chronic lymphocytic leukemia) (HCC) Continue current medications.   MAcrocytic anemia: Noted follow-up with PCP as an outpatient.  Hx CVA - cont home asa, statin  DVT prophylaxis: SCDs Code Status: full Family Communication: wife updated @ bedside  Level of care: Med-Surg Status is: Inpatient Remains inpatient appropriate because: need for inpatient monitoring    Consultants:  Gen surg  Procedures: none  Antimicrobials:  zosyn    Subjective: Some abd pain   Objective: Vitals:   12/16/22 0030 12/16/22 0535 12/16/22 0757 12/16/22 1145  BP: 123/69 132/65 (!) 145/81 (!) 141/73  Pulse: 64 64 60 60  Resp: 20 20 18 18   Temp: 97.6 F (36.4 C) 97.9 F (36.6 C) 98.3 F (36.8 C)   TempSrc: Oral Oral Oral   SpO2:  95% 96% 94%  Weight:  94.1 kg    Height:        Intake/Output Summary (Last 24 hours) at 12/16/2022 1317 Last data filed at 12/16/2022 1145 Gross per 24 hour  Intake 624.8 ml  Output 2475 ml  Net -1850.2 ml   Filed Weights   12/14/22 2130 12/15/22 0600 12/16/22 0535  Weight: 95.3 kg 94.6 kg 94.1 kg    Examination:  General exam: Appears calm and comfortable  Respiratory system: Clear to auscultation. Respiratory effort normal. Cardiovascular system: S1 & S2 heard, RRR. No JVD, murmurs, rubs, gallops or clicks. No pedal edema. Gastrointestinal system: Abdomen is mildly distended, soft and mildlly tender throughout. No organomegaly or masses felt. increased bowel sounds heard. Central nervous system: Alert and oriented. No focal  neurological deficits. Extremities: Symmetric 5 x 5 power. Skin: No rashes, lesions or ulcers Psychiatry: Judgement and insight appear normal. Mood & affect appropriate.     Data Reviewed: I have personally reviewed following labs and imaging studies  CBC: Recent Labs  Lab 12/14/22 2223 12/15/22 0925 12/16/22 0047  WBC 11.0* 8.1 5.1  HGB 12.2* 10.4* 9.6*  HCT  36.7* 31.6* 29.6*  MCV 101.9* 103.6* 103.5*  PLT 221 178 143*   Basic Metabolic Panel: Recent Labs  Lab 12/14/22 2223 12/15/22 0925 12/16/22 0047  NA 139  --  134*  K 4.4  --  3.9  CL 103  --  105  CO2 29  --  24  GLUCOSE 141*  --  99  BUN 12  --  9  CREATININE 1.00 1.02 0.92  CALCIUM 8.8*  --  8.0*   GFR: Estimated Creatinine Clearance: 86.4 mL/min (by C-G formula based on SCr of 0.92 mg/dL). Liver Function Tests: Recent Labs  Lab 12/14/22 2223 12/16/22 0047  AST 25 12*  ALT 12 15  ALKPHOS 120 80  BILITOT 0.7 1.3*  PROT 5.9* 4.8*  ALBUMIN 3.7 2.5*   Recent Labs  Lab 12/14/22 2223  LIPASE 24   No results for input(s): "AMMONIA" in the last 168 hours. Coagulation Profile: No results for input(s): "INR", "PROTIME" in the last 168 hours. Cardiac Enzymes: No results for input(s): "CKTOTAL", "CKMB", "CKMBINDEX", "TROPONINI" in the last 168 hours. BNP (last 3 results) No results for input(s): "PROBNP" in the last 8760 hours. HbA1C: No results for input(s): "HGBA1C" in the last 72 hours. CBG: Recent Labs  Lab 12/14/22 2132  GLUCAP 141*   Lipid Profile: No results for input(s): "CHOL", "HDL", "LDLCALC", "TRIG", "CHOLHDL", "LDLDIRECT" in the last 72 hours. Thyroid Function Tests: No results for input(s): "TSH", "T4TOTAL", "FREET4", "T3FREE", "THYROIDAB" in the last 72 hours. Anemia Panel: No results for input(s): "VITAMINB12", "FOLATE", "FERRITIN", "TIBC", "IRON", "RETICCTPCT" in the last 72 hours. Urine analysis:    Component Value Date/Time   COLORURINE YELLOW 12/15/2022 0030   APPEARANCEUR CLEAR 12/15/2022 0030   LABSPEC >1.046 (H) 12/15/2022 0030   PHURINE 8.5 (H) 12/15/2022 0030   GLUCOSEU NEGATIVE 12/15/2022 0030   HGBUR NEGATIVE 12/15/2022 0030   BILIRUBINUR NEGATIVE 12/15/2022 0030   KETONESUR NEGATIVE 12/15/2022 0030   PROTEINUR TRACE (A) 12/15/2022 0030   NITRITE NEGATIVE 12/15/2022 0030   LEUKOCYTESUR NEGATIVE 12/15/2022 0030   Sepsis  Labs: @LABRCNTIP (procalcitonin:4,lacticidven:4)  ) Recent Results (from the past 240 hour(s))  Culture, blood (routine x 2)     Status: None (Preliminary result)   Collection Time: 12/14/22 10:24 PM   Specimen: BLOOD RIGHT ARM  Result Value Ref Range Status   Specimen Description   Final    BLOOD RIGHT ARM Performed at [facility], [address], [place], [place] [address]    Special Requests   Final    BOTTLES DRAWN AEROBIC AND ANAEROBIC Blood Culture adequate volume Performed at [facility], [address] [place], [place] [address]    Culture   Final    NO GROWTH < 12 HOURS Performed at [facility], [address] [place], [place] [address]    Report Status PENDING  Incomplete  Gastrointestinal Panel by PCR , Stool     Status: None   Collection Time: 12/15/22 12:26 PM   Specimen: STOOL  Result Value Ref Range Status   Campylobacter species NOT DETECTED NOT DETECTED Final   Plesimonas shigelloides NOT  DETECTED NOT DETECTED Final   Salmonella species NOT DETECTED NOT DETECTED Final   Yersinia enterocolitica NOT DETECTED NOT DETECTED Final   Vibrio species NOT DETECTED NOT DETECTED Final   Vibrio cholerae NOT DETECTED NOT DETECTED Final   Enteroaggregative E coli (EAEC) NOT DETECTED NOT DETECTED Final   Enteropathogenic E coli (EPEC) NOT DETECTED NOT DETECTED Final   Enterotoxigenic E coli (ETEC) NOT DETECTED NOT DETECTED Final   Shiga like toxin producing E coli (STEC) NOT DETECTED NOT DETECTED Final   Shigella/Enteroinvasive E coli (EIEC) NOT DETECTED NOT DETECTED Final   Cryptosporidium NOT DETECTED NOT DETECTED Final   Cyclospora cayetanensis NOT DETECTED NOT DETECTED Final   Entamoeba histolytica NOT DETECTED NOT DETECTED Final   Giardia lamblia NOT DETECTED NOT DETECTED Final   Adenovirus F40/41 NOT DETECTED NOT DETECTED Final   Astrovirus NOT DETECTED NOT DETECTED Final   Norovirus GI/GII NOT DETECTED NOT DETECTED Final    Rotavirus A NOT DETECTED NOT DETECTED Final   Sapovirus (I, II, IV, and V) NOT DETECTED NOT DETECTED Final    Comment: Performed at [facility], [address] [place], [place] [address]  C Difficile Quick Screen w PCR reflex     Status: None   Collection Time: 12/15/22 12:26 PM   Specimen: STOOL  Result Value Ref Range Status   C Diff antigen NEGATIVE NEGATIVE Final   C Diff toxin NEGATIVE NEGATIVE Final   C Diff interpretation No C. difficile detected.  Final    Comment: Performed at [facility], [address], [place], [place] [address]         Radiology Studies: CT ABDOMEN PELVIS W CONTRAST  Addendum Date: 12/15/2022   ADDENDUM REPORT: 12/15/2022 00:23 ADDENDUM: These results were called by telephone at the time of interpretation on 12/15/2022 at 12:23 am to provider Dr. Judd Lien, who verbally acknowledged these results. Electronically Signed   By: Tish Frederickson M.D.   On: 12/15/2022 00:23   Result Date: 12/15/2022 CLINICAL DATA:  Abdominal pain, acute, nonlocalized EXAM: CT ABDOMEN AND PELVIS WITH CONTRAST TECHNIQUE: Multidetector CT imaging of the abdomen and pelvis was performed using the standard protocol following bolus administration of intravenous contrast. RADIATION DOSE REDUCTION: This exam was performed according to the departmental dose-optimization program which includes automated exposure control, adjustment of the mA and/or kV according to patient size and/or use of iterative reconstruction technique. CONTRAST:  85mL OMNIPAQUE IOHEXOL 300 MG/ML  SOLN COMPARISON:  CT abdomen pelvis 05/13/2019. FINDINGS: Lower chest: Cardiac leads partially visualized. Hepatobiliary: No focal liver abnormality. Status post cholecystectomy. No biliary dilatation. Pancreas: Diffusely atrophic. No focal lesion. Otherwise normal pancreatic contour. No surrounding inflammatory changes. No main pancreatic ductal dilatation. Spleen: Normal in size without focal abnormality.  Adrenals/Urinary Tract: No adrenal nodule bilaterally. Bilateral kidneys enhance symmetrically. No hydronephrosis. No hydroureter. The urinary bladder is unremarkable. On delayed imaging, there is no urothelial wall thickening and there are no filling defects in the opacified portions of the bilateral collecting systems or ureters. Stomach/Bowel: Sigmoid resection and appendectomy. Stomach is within normal limits. No evidence of large bowel wall thickening or dilatation. Multiple loops of adjacent small bowel within the right mid abdomen demonstrating bowel wall thickening, hazy bowel wall, and marked mesenteric edema. No definite transition point identified. No pneumatosis. The small bowel it is dilated up to 4 cm. Vascular/Lymphatic: No abdominal aorta or iliac aneurysm. Mild atherosclerotic plaque of the aorta and its branches. No abdominal, pelvic, or inguinal lymphadenopathy. Reproductive: Prostate is  unremarkable. Other: No intraperitoneal free fluid. No intraperitoneal free gas. No organized fluid collection. Musculoskeletal: No abdominal wall hernia or abnormality. No suspicious lytic or blastic osseous lesions. No acute displaced fracture. Intervertebral disc space narrowing at the L5-S1 level with associated grade 1 anterolisthesis of L5 on S1. IMPRESSION: 1. Marked inflammatory changes of several loops of right mid abdominal small bowel and mesentery. Differential diagnosis includes small bowel diverticulitis versus enteritis versus developing small bowel obstruction with ischemia. Contained perforation is not excluded. No definite free intraperitoneal gas. 2.  Aortic Atherosclerosis (ICD10-I70.0). Electronically Signed: By: Tish Frederickson M.D. On: 12/15/2022 00:14   CT Head Wo Contrast  Result Date: 12/15/2022 CLINICAL DATA:  Delirium EXAM: CT HEAD WITHOUT CONTRAST TECHNIQUE: Contiguous axial images were obtained from the base of the skull through the vertex without intravenous contrast. RADIATION  DOSE REDUCTION: This exam was performed according to the departmental dose-optimization program which includes automated exposure control, adjustment of the mA and/or kV according to patient size and/or use of iterative reconstruction technique. COMPARISON:  CT head 06/20/2022 FINDINGS: Brain: Cerebral ventricle sizes are concordant with the degree of cerebral volume loss. Patchy and confluent areas of decreased attenuation are noted throughout the deep and periventricular white matter of the cerebral hemispheres bilaterally, compatible with chronic microvascular ischemic disease. No evidence of large-territorial acute infarction. No parenchymal hemorrhage. No mass lesion. No extra-axial collection. No mass effect or midline shift. No hydrocephalus. Basilar cisterns are patent. Vascular: No hyperdense vessel. Skull: No acute fracture or focal lesion. Sinuses/Orbits: Right maxillary sinus mucosal thickening. Paranasal sinuses and mastoid air cells are clear. The orbits are unremarkable. Other: None. IMPRESSION: No acute intracranial abnormality. Electronically Signed   By: Tish Frederickson M.D.   On: 12/15/2022 00:16   DG Chest Portable 1 View  Result Date: 12/14/2022 CLINICAL DATA:  Fevers EXAM: PORTABLE CHEST 1 VIEW COMPARISON:  09/07/2022 FINDINGS: Cardiac shadow is stable. Pacing device is now seen in satisfactory position. Lungs are clear. No bony abnormality is noted. IMPRESSION: No acute abnormality noted. Electronically Signed   By: Alcide Clever M.D.   On: 12/14/2022 22:36        Scheduled Meds:  aspirin EC  81 mg Oral Daily   atorvastatin  20 mg Oral Daily   buPROPion  300 mg Oral Daily   colestipol  1 g Oral Daily   donepezil  5 mg Oral Daily   melatonin  3 mg Oral QHS   sertraline  25 mg Oral Daily   traZODone  100 mg Oral QHS   Continuous Infusions:  piperacillin-tazobactam (ZOSYN)  IV 3.375 g (12/16/22 1036)     LOS: 1 day     Silvano Bilis, MD [facility]   If  7PM-7AM, please contact night-coverage [email or web address] Password [email or web address] 12/16/2022, 1:17 PM

## 2022-12-16 NOTE — Progress Notes (Signed)
Pharmacy Antibiotic Note  Jonathon Parker is a 72 y.o. male admitted on 12/14/2022 with fevers and abdominal pain.  Pharmacy has been consulted for Zosyn dosing. Zosyn was started initially on 5/12 and received 2 doses, then changed to Unasyn early am.  Now back to Zosyn.   Hx antibiotics with recent admit at OSH, completed antibiotics for enteritis on 12/10/22.   Plan: Zosyn 3.375 gm IV q8h (each over 4 hours)  Height: 6' (182.9 cm) Weight: 94.1 kg (207 lb 7.3 oz) IBW/kg (Calculated) : 77.6  Temp (24hrs), Avg:98 F (36.7 C), Min:97.4 F (36.3 C), Max:98.8 F (37.1 C)  Recent Labs  Lab 12/14/22 2223 12/15/22 0925 12/16/22 0047  WBC 11.0* 8.1 5.1  CREATININE 1.00 1.02 0.92  LATICACIDVEN 1.7  --   --     Estimated Creatinine Clearance: 86.4 mL/min (by C-G formula based on SCr of 0.92 mg/dL).    Allergies  Allergen Reactions   Lisinopril Cough   Zolpidem Other (See Comments)    "Got me in outer space"     Antimicrobials this admission: Azithromycin PO x 1 on 5/12 Zosyn 5/12>>5/13 0034> resume 5/13 11am >> Unasyn 5/13 x 2 doses   Dose adjustments this admission: n/a  Microbiology results: 5/12 blood: ng < 12 hrs to date 5/12 GI panel: negative 5/12 C diff: negative  Thank you for allowing pharmacy to be a part of this patient's care.  Dennie Fetters, RPh 12/16/2022 10:03 AM

## 2022-12-16 NOTE — Progress Notes (Signed)
Subjective: CC: Doing much better. Pain improved yesterday, only required prn tylenol for pain. Since then, pain has resolved this am. No n/v. Passing flatus. Non-bloody bm yesterday. No melena.   Patient reports hx appendectomy, cholecystectomy, b/l inguinal hernia repair (he believes with mesh) and ex lap, colectomy for pre-cancerous lesion in colon in 2002.   Objective: Vital signs in last 24 hours: Temp:  [97.4 F (36.3 C)-98.8 F (37.1 C)] 98.3 F (36.8 C) (05/13 0757) Pulse Rate:  [59-64] 60 (05/13 0757) Resp:  [18-20] 18 (05/13 0757) BP: (103-145)/(65-81) 145/81 (05/13 0757) SpO2:  [93 %-97 %] 96 % (05/13 0757) Weight:  [94.1 kg] 94.1 kg (05/13 0535) Last BM Date : 12/15/22  Intake/Output from previous day: 05/12 0701 - 05/13 0700 In: 624.8 [P.O.:20; I.V.:593.9; IV Piggyback:10.9] Out: 1526 [Urine:1525; Stool:1] Intake/Output this shift: Total I/O In: -  Out: 500 [Urine:500]  PE: Gen:  Alert, NAD, pleasant Abd: Soft, mild distension, NT +BS Psych: A&Ox3   Lab Results:  Recent Labs    12/15/22 0925 12/16/22 0047  WBC 8.1 5.1  HGB 10.4* 9.6*  HCT 31.6* 29.6*  PLT 178 143*   BMET Recent Labs    12/14/22 2223 12/15/22 0925 12/16/22 0047  NA 139  --  134*  K 4.4  --  3.9  CL 103  --  105  CO2 29  --  24  GLUCOSE 141*  --  99  BUN 12  --  9  CREATININE 1.00 1.02 0.92  CALCIUM 8.8*  --  8.0*   PT/INR No results for input(s): "LABPROT", "INR" in the last 72 hours. CMP     Component Value Date/Time   NA 134 (L) 12/16/2022 0047   NA 138 05/23/2021 0000   K 3.9 12/16/2022 0047   CL 105 12/16/2022 0047   CO2 24 12/16/2022 0047   GLUCOSE 99 12/16/2022 0047   BUN 9 12/16/2022 0047   BUN 16 05/23/2021 0000   CREATININE 0.92 12/16/2022 0047   CALCIUM 8.0 (L) 12/16/2022 0047   PROT 4.8 (L) 12/16/2022 0047   ALBUMIN 2.5 (L) 12/16/2022 0047   AST 12 (L) 12/16/2022 0047   ALT 15 12/16/2022 0047   ALKPHOS 80 12/16/2022 0047   BILITOT 1.3 (H)  12/16/2022 0047   GFRNONAA >60 12/16/2022 0047   GFRAA >60 05/13/2019 0312   Lipase     Component Value Date/Time   LIPASE 24 12/14/2022 2223    Studies/Results: CT ABDOMEN PELVIS W CONTRAST  Addendum Date: 12/15/2022   ADDENDUM REPORT: 12/15/2022 00:23 ADDENDUM: These results were called by telephone at the time of interpretation on 12/15/2022 at 12:23 am to provider Dr. Judd Lien, who verbally acknowledged these results. Electronically Signed   By: Tish Frederickson M.D.   On: 12/15/2022 00:23   Result Date: 12/15/2022 CLINICAL DATA:  Abdominal pain, acute, nonlocalized EXAM: CT ABDOMEN AND PELVIS WITH CONTRAST TECHNIQUE: Multidetector CT imaging of the abdomen and pelvis was performed using the standard protocol following bolus administration of intravenous contrast. RADIATION DOSE REDUCTION: This exam was performed according to the departmental dose-optimization program which includes automated exposure control, adjustment of the mA and/or kV according to patient size and/or use of iterative reconstruction technique. CONTRAST:  85mL OMNIPAQUE IOHEXOL 300 MG/ML  SOLN COMPARISON:  CT abdomen pelvis 05/13/2019. FINDINGS: Lower chest: Cardiac leads partially visualized. Hepatobiliary: No focal liver abnormality. Status post cholecystectomy. No biliary dilatation. Pancreas: Diffusely atrophic. No focal lesion. Otherwise normal pancreatic contour. No surrounding  inflammatory changes. No main pancreatic ductal dilatation. Spleen: Normal in size without focal abnormality. Adrenals/Urinary Tract: No adrenal nodule bilaterally. Bilateral kidneys enhance symmetrically. No hydronephrosis. No hydroureter. The urinary bladder is unremarkable. On delayed imaging, there is no urothelial wall thickening and there are no filling defects in the opacified portions of the bilateral collecting systems or ureters. Stomach/Bowel: Sigmoid resection and appendectomy. Stomach is within normal limits. No evidence of large bowel wall  thickening or dilatation. Multiple loops of adjacent small bowel within the right mid abdomen demonstrating bowel wall thickening, hazy bowel wall, and marked mesenteric edema. No definite transition point identified. No pneumatosis. The small bowel it is dilated up to 4 cm. Vascular/Lymphatic: No abdominal aorta or iliac aneurysm. Mild atherosclerotic plaque of the aorta and its branches. No abdominal, pelvic, or inguinal lymphadenopathy. Reproductive: Prostate is unremarkable. Other: No intraperitoneal free fluid. No intraperitoneal free gas. No organized fluid collection. Musculoskeletal: No abdominal wall hernia or abnormality. No suspicious lytic or blastic osseous lesions. No acute displaced fracture. Intervertebral disc space narrowing at the L5-S1 level with associated grade 1 anterolisthesis of L5 on S1. IMPRESSION: 1. Marked inflammatory changes of several loops of right mid abdominal small bowel and mesentery. Differential diagnosis includes small bowel diverticulitis versus enteritis versus developing small bowel obstruction with ischemia. Contained perforation is not excluded. No definite free intraperitoneal gas. 2.  Aortic Atherosclerosis (ICD10-I70.0). Electronically Signed: By: Tish Frederickson M.D. On: 12/15/2022 00:14   CT Head Wo Contrast  Result Date: 12/15/2022 CLINICAL DATA:  Delirium EXAM: CT HEAD WITHOUT CONTRAST TECHNIQUE: Contiguous axial images were obtained from the base of the skull through the vertex without intravenous contrast. RADIATION DOSE REDUCTION: This exam was performed according to the departmental dose-optimization program which includes automated exposure control, adjustment of the mA and/or kV according to patient size and/or use of iterative reconstruction technique. COMPARISON:  CT head 06/20/2022 FINDINGS: Brain: Cerebral ventricle sizes are concordant with the degree of cerebral volume loss. Patchy and confluent areas of decreased attenuation are noted throughout  the deep and periventricular white matter of the cerebral hemispheres bilaterally, compatible with chronic microvascular ischemic disease. No evidence of large-territorial acute infarction. No parenchymal hemorrhage. No mass lesion. No extra-axial collection. No mass effect or midline shift. No hydrocephalus. Basilar cisterns are patent. Vascular: No hyperdense vessel. Skull: No acute fracture or focal lesion. Sinuses/Orbits: Right maxillary sinus mucosal thickening. Paranasal sinuses and mastoid air cells are clear. The orbits are unremarkable. Other: None. IMPRESSION: No acute intracranial abnormality. Electronically Signed   By: Tish Frederickson M.D.   On: 12/15/2022 00:16   DG Chest Portable 1 View  Result Date: 12/14/2022 CLINICAL DATA:  Fevers EXAM: PORTABLE CHEST 1 VIEW COMPARISON:  09/07/2022 FINDINGS: Cardiac shadow is stable. Pacing device is now seen in satisfactory position. Lungs are clear. No bony abnormality is noted. IMPRESSION: No acute abnormality noted. Electronically Signed   By: Alcide Clever M.D.   On: 12/14/2022 22:36    Anti-infectives: Anti-infectives (From admission, onward)    Start     Dose/Rate Route Frequency Ordered Stop   12/16/22 0400  ampicillin-sulbactam (UNASYN) 1.5 g in sodium chloride 0.9 % 100 mL IVPB        1.5 g 200 mL/hr over 30 Minutes Intravenous Every 6 hours 12/16/22 0258     12/15/22 1630  piperacillin-tazobactam (ZOSYN) IVPB 3.375 g  Status:  Discontinued        3.375 g 12.5 mL/hr over 240 Minutes Intravenous Every 8 hours 12/15/22 1528  12/16/22 0307   12/15/22 1000  azithromycin (ZITHROMAX) tablet 500 mg  Status:  Discontinued        500 mg Oral Daily 12/15/22 0900 12/16/22 0258   12/15/22 0045  piperacillin-tazobactam (ZOSYN) IVPB 3.375 g        3.375 g 12.5 mL/hr over 240 Minutes Intravenous  Once 12/15/22 0044 12/15/22 0446        Assessment/Plan 72 year old male who has been suffering with some form of enteritis for a few weeks now,  discharged from [facility] about 11 days ago with recurrent pain began a few days after completing his antibiotic course. Currently he is afebrile without tachycardia or hypotension, WBC has normalized at 5.1, no acidosis or acute kidney injury present at this time.  Lactate is normal on last check. Abdominal exam benign and he is symptomatically improving. No bloody or melanous bm's.  CT report and images personally and agree with findings of inflammatory changes surrounding multiple loops of small bowel and mesentery in the right hemiabdomen. No pneumatosis, free air or free fluid. Suspicion for ischemia is low given the above but this may represent contained small bowel perforation/diverticulitis or other etiology. His GI panel and C. Diff were negative. Bcx thus far are neg. Given reassuring exam and improvement as above, okay for trial of cld. Will follow closely as he may require exploration this admission depending on progress.   FEN - CLD, IVF VTE - SCDs, on Eliquis (last dose 5/13 am) - please hold. Ok for heparin gtt.  ID - On Unasyn, recommend broadening to Zosyn  CLL (last chemo in November but on venetoclax)  Recent resp issues - bronchitis, pneumonia, RSV  A. Fib/Bradycardia - pacemaker placement about a month ago   I reviewed nursing notes, hospitalist notes, last 24 h vitals and pain scores, last 48 h intake and output, last 24 h labs and trends, and last 24 h imaging results.   LOS: 1 day    Jacinto Halim , [facility] 12/16/2022, 8:15 AM Please see Amion for pager number during day hours 7:00am-4:30pm

## 2022-12-17 ENCOUNTER — Other Ambulatory Visit: Payer: Self-pay

## 2022-12-17 DIAGNOSIS — K529 Noninfective gastroenteritis and colitis, unspecified: Secondary | ICD-10-CM | POA: Diagnosis not present

## 2022-12-17 LAB — BASIC METABOLIC PANEL
Anion gap: 7 (ref 5–15)
BUN: 7 mg/dL — ABNORMAL LOW (ref 8–23)
CO2: 24 mmol/L (ref 22–32)
Calcium: 8.5 mg/dL — ABNORMAL LOW (ref 8.9–10.3)
Chloride: 104 mmol/L (ref 98–111)
Creatinine, Ser: 0.99 mg/dL (ref 0.61–1.24)
GFR, Estimated: >60 mL/min (ref >60)
Glucose, Bld: 99 mg/dL (ref 70–99)
Potassium: 3.9 mmol/L (ref 3.5–5.1)
Sodium: 135 mmol/L (ref 135–145)

## 2022-12-17 LAB — CBC
HCT: 29.2 % — ABNORMAL LOW (ref 39.0–52.0)
Hemoglobin: 9.7 g/dL — ABNORMAL LOW (ref 13.0–17.0)
MCH: 33.4 pg (ref 26.0–34.0)
MCHC: 33.2 g/dL (ref 30.0–36.0)
MCV: 100.7 fL — ABNORMAL HIGH (ref 80.0–100.0)
Platelets: 154 10*3/uL (ref 150–400)
RBC: 2.9 MIL/uL — ABNORMAL LOW (ref 4.22–5.81)
RDW: 12.5 % (ref 11.5–15.5)
WBC: 4.1 10*3/uL (ref 4.0–10.5)
nRBC: 0 % (ref 0.0–0.2)

## 2022-12-17 LAB — APTT: aPTT: 43 seconds — ABNORMAL HIGH (ref 24–36)

## 2022-12-17 MED ORDER — SODIUM CHLORIDE 0.9% FLUSH
10.0000 mL | INTRAVENOUS | Status: DC | PRN
  Administered 2022-12-25: 10 mL

## 2022-12-17 MED ORDER — CHLORHEXIDINE GLUCONATE CLOTH 2 % EX PADS
6.0000 | MEDICATED_PAD | Freq: Every day | CUTANEOUS | Status: DC
  Administered 2022-12-17 – 2023-01-03 (×18): 6 via TOPICAL

## 2022-12-17 MED ORDER — HEPARIN (PORCINE) 25000 UT/250ML-% IV SOLN: 1350.0000 [IU]/h | INTRAVENOUS | Status: AC

## 2022-12-17 MED ORDER — SODIUM CHLORIDE 0.9% FLUSH
10.0000 mL | Freq: Two times a day (BID) | INTRAVENOUS | Status: DC
  Administered 2022-12-17 – 2022-12-20 (×7): 10 mL
  Administered 2022-12-21: 20 mL
  Administered 2022-12-22 – 2023-01-03 (×22): 10 mL

## 2022-12-17 MED ORDER — HEPARIN (PORCINE) 25000 UT/250ML-% IV SOLN
1200.0000 [IU]/h | INTRAVENOUS | Status: DC
  Administered 2022-12-17: 1200 [IU]/h via INTRAVENOUS
  Filled 2022-12-17: qty 250

## 2022-12-17 NOTE — H&P (View-Only) (Signed)
     Subjective: CC: Started having R sided abdominal pain after intake of cld yesterday. Pain is mild. Denies n/v. No PRN pain or nausea meds yesterday. Passing flatus. No BM yesterday.   Afebrile. No tachycardia or hypotension. WBC 4.1. CTA done yesterday w/ no evidence of visceral artery stenosis or occlusion; excellent arterial supply to the affected bowel in the right upper quadrant. There as interval progression of the inflammatory process affecting several loops of small bowel in the right upper quadrant  with small bowel diverticula in the same region on prior films, now with multiple locules of extraluminal gas consistent with contained perforation. No free fluid.   Objective: Vital signs in last 24 hours: Temp:  [98.1 F (36.7 C)-98.3 F (36.8 C)] 98.2 F (36.8 C) (05/14 0704) Pulse Rate:  [60-77] 77 (05/14 0704) Resp:  [17-18] 18 (05/14 0704) BP: (134-141)/(8-80) 140/79 (05/14 0704) SpO2:  [93 %-96 %] 95 % (05/14 0704) Weight:  [90.6 kg] 90.6 kg (05/14 0349) Last BM Date : 12/15/22  Intake/Output from previous day: 05/13 0701 - 05/14 0700 In: 50 [IV Piggyback:50] Out: 3150 [Urine:3150] Intake/Output this shift: Total I/O In: 480 [P.O.:480] Out: -   PE: Gen:  Alert, NAD, pleasant Abd: Soft, mild distension, some epigastric and diffuse R sided abdominal ttp without rigidity or guarding, +BS Psych: A&Ox3   Lab Results:  Recent Labs    12/16/22 0047 12/17/22 0116  WBC 5.1 4.1  HGB 9.6* 9.7*  HCT 29.6* 29.2*  PLT 143* 154    BMET Recent Labs    12/16/22 0047 12/17/22 0116  NA 134* 135  K 3.9 3.9  CL 105 104  CO2 24 24  GLUCOSE 99 99  BUN 9 7*  CREATININE 0.92 0.99  CALCIUM 8.0* 8.5*    PT/INR No results for input(s): "LABPROT", "INR" in the last 72 hours. CMP     Component Value Date/Time   NA 135 12/17/2022 0116   NA 138 05/23/2021 0000   K 3.9 12/17/2022 0116   CL 104 12/17/2022 0116   CO2 24 12/17/2022 0116   GLUCOSE 99 12/17/2022  0116   BUN 7 (L) 12/17/2022 0116   BUN 16 05/23/2021 0000   CREATININE 0.99 12/17/2022 0116   CALCIUM 8.5 (L) 12/17/2022 0116   PROT 4.8 (L) 12/16/2022 0047   ALBUMIN 2.5 (L) 12/16/2022 0047   AST 12 (L) 12/16/2022 0047   ALT 15 12/16/2022 0047   ALKPHOS 80 12/16/2022 0047   BILITOT 1.3 (H) 12/16/2022 0047   GFRNONAA >60 12/17/2022 0116   GFRAA >60 05/13/2019 0312   Lipase     Component Value Date/Time   LIPASE 24 12/14/2022 2223    Studies/Results: CT Angio Abd/Pel w/ and/or w/o  Result Date: 12/17/2022 CLINICAL DATA:  Chronic mesenteric ischemia EXAM: CTA ABDOMEN AND PELVIS WITHOUT AND WITH CONTRAST TECHNIQUE: Multidetector CT imaging of the abdomen and pelvis was performed using the standard protocol during bolus administration of intravenous contrast. Multiplanar reconstructed images and MIPs were obtained and reviewed to evaluate the vascular anatomy. RADIATION DOSE REDUCTION: This exam was performed according to the departmental dose-optimization program which includes automated exposure control, adjustment of the mA and/or kV according to patient size and/or use of iterative reconstruction technique. CONTRAST:  100mL OMNIPAQUE IOHEXOL 350 MG/ML SOLN COMPARISON:  CT scan of the abdomen and pelvis 12/15/2022 FINDINGS: VASCULAR Aorta: Normal caliber aorta without aneurysm, dissection, vasculitis or significant stenosis. Celiac: Patent without evidence of aneurysm, dissection, vasculitis or significant stenosis.   SMA: Patent without evidence of aneurysm, dissection, vasculitis or significant stenosis. Renals: Both renal arteries are patent without evidence of aneurysm, dissection, vasculitis, fibromuscular dysplasia or significant stenosis. IMA: Patent without evidence of aneurysm, dissection, vasculitis or significant stenosis. Inflow: Patent without evidence of aneurysm, dissection, vasculitis or significant stenosis. Proximal Outflow: Bilateral common femoral and visualized portions of  the superficial and profunda femoral arteries are patent without evidence of aneurysm, dissection, vasculitis or significant stenosis. Veins: The portal and mesenteric veins are widely patent. No focal venous abnormality. Review of the MIP images confirms the above findings. NON-VASCULAR Lower chest: Trace bilateral pleural effusions and associated atelectasis. Partially imaged cardiac rhythm maintenance device. The heart is normal in size. Hepatobiliary: The gallbladder is surgically absent. Trace air is present within the common hepatic duct and the left intrahepatic ducts suggesting patency of the biliary sphincter. No discrete hepatic lesion. Pancreas: Unremarkable. No pancreatic ductal dilatation or surrounding inflammatory changes. Spleen: No splenic injury or perisplenic hematoma. Adrenals/Urinary Tract: The adrenal glands are normal. No hydronephrosis, nephrolithiasis or enhancing renal mass. The ureters and bladder are unremarkable. Stomach/Bowel: Progressive inflammatory changes in the right upper quadrant now with multiple locules of extraperitoneal air. Multiple small bowel diverticula are also present in the region. Correlation with prior imaging from November of 2023 confirms multifocal small bowel diverticula in this region. The imaging appearance is most consistent with perforated diverticulitis of the small bowel. Lymphatic: No suspicious lymphadenopathy on today's exam. Reproductive: Prostate is unremarkable. Other: Surgical changes of prior bilateral laparoscopic ventral hernia repair. No discrete abscess or free fluid. Musculoskeletal: Advanced L5-S1 degenerative disc disease. IMPRESSION: VASCULAR 1. No evidence of visceral artery stenosis or occlusion. There is excellent arterial supply to the affected bowel in the right upper quadrant. By imaging, ischemia is not a consideration. 2. No evidence of venous obstruction or thrombosis. 3. Mild scattered atherosclerotic plaque. Aortic Atherosclerosis  (ICD10-I70.0). NON-VASCULAR 1. Interval progression of the inflammatory process affecting several loops of small bowel in the right upper quadrant now with multiple locules of extraluminal gas consistent with contained perforation. Correlation with prior imaging demonstrates multiple small bowel diverticula in the same region. Overall, the imaging appearance is most consistent with perforated diverticulitis of the small bowel in the right upper quadrant. 2. No abscess formation or free fluid at this time. 3. Additional ancillary findings as above. Electronically Signed   By: Heath  McCullough M.D.   On: 12/17/2022 06:20    Anti-infectives: Anti-infectives (From admission, onward)    Start     Dose/Rate Route Frequency Ordered Stop   12/16/22 1100  piperacillin-tazobactam (ZOSYN) IVPB 3.375 g        3.375 g 12.5 mL/hr over 240 Minutes Intravenous Every 8 hours 12/16/22 0954     12/16/22 0400  ampicillin-sulbactam (UNASYN) 1.5 g in sodium chloride 0.9 % 100 mL IVPB  Status:  Discontinued        1.5 g 200 mL/hr over 30 Minutes Intravenous Every 6 hours 12/16/22 0258 12/16/22 0855   12/15/22 1630  piperacillin-tazobactam (ZOSYN) IVPB 3.375 g  Status:  Discontinued        3.375 g 12.5 mL/hr over 240 Minutes Intravenous Every 8 hours 12/15/22 1528 12/16/22 0307   12/15/22 1000  azithromycin (ZITHROMAX) tablet 500 mg  Status:  Discontinued        500 mg Oral Daily 12/15/22 0900 12/16/22 0258   12/15/22 0045  piperacillin-tazobactam (ZOSYN) IVPB 3.375 g        3.375 g 12.5 mL/hr over   240 Minutes Intravenous  Once 12/15/22 0044 12/15/22 0446        Assessment/Plan Contained perforation of small bowel diverticula? 72-year-old male who has been suffering with some form of enteritis for a few weeks now, discharged from [facility] in [place] about 12 days ago with recurrent pain began a few days after completing his antibiotic course. His GI panel and C. Diff were negative. Currently he  is afebrile without tachycardia or hypotension, WBC is wnl, no acidosis or acute kidney injury present at this time.  Lactate is normal on last check. No bloody or melanous bm's. Admission CT with inflammatory changes surrounding multiple loops of small bowel and mesentery in the right hemiabdomen. No pneumatosis, free air or free fluid. Repeat CTA last night w/ excellent arterial supply to the affected bowel in the right upper quadrant; interval progression of the inflammatory process affecting several loops of small bowel in the right upper quadrant with small bowel diverticula in the same region on prior films, now with multiple locules of extraluminal gas consistent with contained perforation. No free fluid. No abscess. He was started on trial of CLD yesterday and began having increased R sided abdominal pain. He has increased ttp on the R side of his abdomen today without rigidity or guarding. Given increased inflammatory changes on CT and R sided abdominal pain w/ trial of CLD I think he may benefit from exploratory surgery during admission. As patient is HDS as above with normal wbc, no peritonitis on exam, and things look contained on CT, I think it is reasonable to wait until his Eliquis wears off before proceeding with surgery (last dose 5/13). Will review about with MD.   FEN - Sips/chips, npo midnight, IVF VTE - SCDs, on Eliquis (last dose 5/13 am) - please hold. Ok for heparin gtt. Please hold at midnight if started.  ID - Zosyn  CLL (last chemo in November but on venetoclax)  Recent resp issues - bronchitis, pneumonia, RSV  A. Fib/Bradycardia - pacemaker placement about a month ago   I reviewed nursing notes, hospitalist notes, last 24 h vitals and pain scores, last 48 h intake and output, last 24 h labs and trends, and last 24 h imaging results.   LOS: 2 days    Ceceilia Cephus M Jimesha Rising , PA-C [facility] 12/17/2022, 8:17 AM Please see Amion for pager number during day hours  7:00am-4:30pm  

## 2022-12-17 NOTE — Progress Notes (Signed)
ANTICOAGULATION CONSULT NOTE - Initial Consult  Pharmacy Consult for Heparin (Eliquis on hold) Indication: atrial fibrillation  Allergies  Allergen Reactions   Lisinopril Cough   Zolpidem Other (See Comments)    "Got me in outer space"     Patient Measurements: Height: 6' (182.9 cm) Weight: 90.6 kg (199 lb 11.8 oz) IBW/kg (Calculated) : 77.6 Heparin Dosing Weight: 90 kg  Vital Signs: Temp: 98.2 F (36.8 C) (05/14 1058) Temp Source: Oral (05/14 1058) BP: 145/78 (05/14 1058) Pulse Rate: 60 (05/14 1058)  Labs: Recent Labs    12/15/22 0925 12/16/22 0047 12/17/22 0116  HGB 10.4* 9.6* 9.7*  HCT 31.6* 29.6* 29.2*  PLT 178 143* 154  CREATININE 1.02 0.92 0.99    Estimated Creatinine Clearance: 74 mL/min (by C-G formula based on SCr of 0.99 mg/dL).   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 Valentino Hue in [facility] hospital     Depression    GERD (gastroesophageal reflux disease)    controlled with nexium    Hypertension    Pacemaker    Assessment: 72 yr old male on Eliquis 5 mg BID prior to admission for atrial fibrillation.  Eliquis stopped after 8am dose on 5/13 for possible surgery. To begin IV heparin today.  Surgery notes need to hold IV heparin at midnight for OR. Possible exploratory surgery on 5/15, but not yet scheduled.   Will use aPTTs for heparin monitoring while heparin levels are expected to be falsely elevated due to recent Eliquis doses. CBC has trended down since admission, today's values consistent with yesterday's values. No bleeding reported.   Goal of Therapy:  Heparin level 0.3-0.7 units/ml aPTT 66-102 seconds Monitor platelets by anticoagulation protocol: Yes   Plan:  Begin heparin drip at 1200 units/hr aPTT ~8hr after heparin begins. Heparin to stop at midnight tonight, with possible  surgery on 5/15. Will follow up OR schedule and modify stop time if needed. Eliquis on hold.   Dennie Fetters, RPh 12/17/2022,11:39 AM

## 2022-12-17 NOTE — Progress Notes (Signed)
Subjective: CC: Started having R sided abdominal pain after intake of cld yesterday. Pain is mild. Denies n/v. No PRN pain or nausea meds yesterday. Passing flatus. No BM yesterday.   Afebrile. No tachycardia or hypotension. WBC 4.1. CTA done yesterday w/ no evidence of visceral artery stenosis or occlusion; excellent arterial supply to the affected bowel in the right upper quadrant. There as interval progression of the inflammatory process affecting several loops of small bowel in the right upper quadrant  with small bowel diverticula in the same region on prior films, now with multiple locules of extraluminal gas consistent with contained perforation. No free fluid.   Objective: Vital signs in last 24 hours: Temp:  [98.1 F (36.7 C)-98.3 F (36.8 C)] 98.2 F (36.8 C) (05/14 0704) Pulse Rate:  [60-77] 77 (05/14 0704) Resp:  [17-18] 18 (05/14 0704) BP: (134-141)/(8-80) 140/79 (05/14 0704) SpO2:  [93 %-96 %] 95 % (05/14 0704) Weight:  [90.6 kg] 90.6 kg (05/14 0349) Last BM Date : 12/15/22  Intake/Output from previous day: 05/13 0701 - 05/14 0700 In: 50 [IV Piggyback:50] Out: 3150 [Urine:3150] Intake/Output this shift: Total I/O In: 480 [P.O.:480] Out: -   PE: Gen:  Alert, NAD, pleasant Abd: Soft, mild distension, some epigastric and diffuse R sided abdominal ttp without rigidity or guarding, +BS Psych: A&Ox3   Lab Results:  Recent Labs    12/16/22 0047 12/17/22 0116  WBC 5.1 4.1  HGB 9.6* 9.7*  HCT 29.6* 29.2*  PLT 143* 154    BMET Recent Labs    12/16/22 0047 12/17/22 0116  NA 134* 135  K 3.9 3.9  CL 105 104  CO2 24 24  GLUCOSE 99 99  BUN 9 7*  CREATININE 0.92 0.99  CALCIUM 8.0* 8.5*    PT/INR No results for input(s): "LABPROT", "INR" in the last 72 hours. CMP     Component Value Date/Time   NA 135 12/17/2022 0116   NA 138 05/23/2021 0000   K 3.9 12/17/2022 0116   CL 104 12/17/2022 0116   CO2 24 12/17/2022 0116   GLUCOSE 99 12/17/2022  0116   BUN 7 (L) 12/17/2022 0116   BUN 16 05/23/2021 0000   CREATININE 0.99 12/17/2022 0116   CALCIUM 8.5 (L) 12/17/2022 0116   PROT 4.8 (L) 12/16/2022 0047   ALBUMIN 2.5 (L) 12/16/2022 0047   AST 12 (L) 12/16/2022 0047   ALT 15 12/16/2022 0047   ALKPHOS 80 12/16/2022 0047   BILITOT 1.3 (H) 12/16/2022 0047   GFRNONAA >60 12/17/2022 0116   GFRAA >60 05/13/2019 0312   Lipase     Component Value Date/Time   LIPASE 24 12/14/2022 2223    Studies/Results: CT Angio Abd/Pel w/ and/or w/o  Result Date: 12/17/2022 CLINICAL DATA:  Chronic mesenteric ischemia EXAM: CTA ABDOMEN AND PELVIS WITHOUT AND WITH CONTRAST TECHNIQUE: Multidetector CT imaging of the abdomen and pelvis was performed using the standard protocol during bolus administration of intravenous contrast. Multiplanar reconstructed images and MIPs were obtained and reviewed to evaluate the vascular anatomy. RADIATION DOSE REDUCTION: This exam was performed according to the departmental dose-optimization program which includes automated exposure control, adjustment of the mA and/or kV according to patient size and/or use of iterative reconstruction technique. CONTRAST:  OMNIPAQUE IOHEXOL 350 MG/ML SOLN COMPARISON:  CT scan of the abdomen and pelvis 12/15/2022 FINDINGS: VASCULAR Aorta: Normal caliber aorta without aneurysm, dissection, vasculitis or significant stenosis. Celiac: Patent without evidence of aneurysm, dissection, vasculitis or significant stenosis.  SMA: Patent without evidence of aneurysm, dissection, vasculitis or significant stenosis. Renals: Both renal arteries are patent without evidence of aneurysm, dissection, vasculitis, fibromuscular dysplasia or significant stenosis. IMA: Patent without evidence of aneurysm, dissection, vasculitis or significant stenosis. Inflow: Patent without evidence of aneurysm, dissection, vasculitis or significant stenosis. Proximal Outflow: Bilateral common femoral and visualized portions of  the superficial and profunda femoral arteries are patent without evidence of aneurysm, dissection, vasculitis or significant stenosis. Veins: The portal and mesenteric veins are widely patent. No focal venous abnormality. Review of the MIP images confirms the above findings. NON-VASCULAR Lower chest: Trace bilateral pleural effusions and associated atelectasis. Partially imaged cardiac rhythm maintenance device. The heart is normal in size. Hepatobiliary: The gallbladder is surgically absent. Trace air is present within the common hepatic duct and the left intrahepatic ducts suggesting patency of the biliary sphincter. No discrete hepatic lesion. Pancreas: Unremarkable. No pancreatic ductal dilatation or surrounding inflammatory changes. Spleen: No splenic injury or perisplenic hematoma. Adrenals/Urinary Tract: The adrenal glands are normal. No hydronephrosis, nephrolithiasis or enhancing renal mass. The ureters and bladder are unremarkable. Stomach/Bowel: Progressive inflammatory changes in the right upper quadrant now with multiple locules of extraperitoneal air. Multiple small bowel diverticula are also present in the region. Correlation with prior imaging from November of 2023 confirms multifocal small bowel diverticula in this region. The imaging appearance is most consistent with perforated diverticulitis of the small bowel. Lymphatic: No suspicious lymphadenopathy on today's exam. Reproductive: Prostate is unremarkable. Other: Surgical changes of prior bilateral laparoscopic ventral hernia repair. No discrete abscess or free fluid. Musculoskeletal: Advanced L5-S1 degenerative disc disease. IMPRESSION: VASCULAR 1. No evidence of visceral artery stenosis or occlusion. There is excellent arterial supply to the affected bowel in the right upper quadrant. By imaging, ischemia is not a consideration. 2. No evidence of venous obstruction or thrombosis. 3. Mild scattered atherosclerotic plaque. Aortic Atherosclerosis  (ICD10-I70.0). NON-VASCULAR 1. Interval progression of the inflammatory process affecting several loops of small bowel in the right upper quadrant now with multiple locules of extraluminal gas consistent with contained perforation. Correlation with prior imaging demonstrates multiple small bowel diverticula in the same region. Overall, the imaging appearance is most consistent with perforated diverticulitis of the small bowel in the right upper quadrant. 2. No abscess formation or free fluid at this time. 3. Additional ancillary findings as above. Electronically Signed   By: Malachy Moan M.D.   On: 12/17/2022 06:20    Anti-infectives: Anti-infectives (From admission, onward)    Start     Dose/Rate Route Frequency Ordered Stop   12/16/22 1100  piperacillin-tazobactam (ZOSYN) IVPB 3.375 g        3.375 g 12.5 mL/hr over 240 Minutes Intravenous Every 8 hours 12/16/22 0954     12/16/22 0400  ampicillin-sulbactam (UNASYN) 1.5 g in sodium chloride 0.9 % 100 mL IVPB  Status:  Discontinued        1.5 g 200 mL/hr over 30 Minutes Intravenous Every 6 hours 12/16/22 0258 12/16/22 0855   12/15/22 1630  piperacillin-tazobactam (ZOSYN) IVPB 3.375 g  Status:  Discontinued        3.375 g 12.5 mL/hr over 240 Minutes Intravenous Every 8 hours 12/15/22 1528 12/16/22 0307   12/15/22 1000  azithromycin (ZITHROMAX) tablet 500 mg  Status:  Discontinued        500 mg Oral Daily 12/15/22 0900 12/16/22 0258   12/15/22 0045  piperacillin-tazobactam (ZOSYN) IVPB 3.375 g        3.375 g 12.5 mL/hr over  240 Minutes Intravenous  Once 12/15/22 0044 12/15/22 0446        Assessment/Plan Contained perforation of small bowel diverticula? 72 year old male who has been suffering with some form of enteritis for a few weeks now, discharged from [facility] in [facility] about 12 days ago with recurrent pain began a few days after completing his antibiotic course. His GI panel and C. Diff were negative. Currently he  is afebrile without tachycardia or hypotension, WBC is wnl, no acidosis or acute kidney injury present at this time.  Lactate is normal on last check. No bloody or melanous bm's. Admission CT with inflammatory changes surrounding multiple loops of small bowel and mesentery in the right hemiabdomen. No pneumatosis, free air or free fluid. Repeat CTA last night w/ excellent arterial supply to the affected bowel in the right upper quadrant; interval progression of the inflammatory process affecting several loops of small bowel in the right upper quadrant with small bowel diverticula in the same region on prior films, now with multiple locules of extraluminal gas consistent with contained perforation. No free fluid. No abscess. He was started on trial of CLD yesterday and began having increased R sided abdominal pain. He has increased ttp on the R side of his abdomen today without rigidity or guarding. Given increased inflammatory changes on CT and R sided abdominal pain w/ trial of CLD I think he may benefit from exploratory surgery during admission. As patient is HDS as above with normal wbc, no peritonitis on exam, and things look contained on CT, I think it is reasonable to wait until his Eliquis wears off before proceeding with surgery (last dose 5/13). Will review about with MD.   FEN - Sips/chips, npo midnight, IVF VTE - SCDs, on Eliquis (last dose 5/13 am) - please hold. Ok for heparin gtt. Please hold at midnight if started.  ID - Zosyn  CLL (last chemo in November but on venetoclax)  Recent resp issues - bronchitis, pneumonia, RSV  A. Fib/Bradycardia - pacemaker placement about a month ago   I reviewed nursing notes, hospitalist notes, last 24 h vitals and pain scores, last 48 h intake and output, last 24 h labs and trends, and last 24 h imaging results.   LOS: 2 days    Jacinto Halim , [facility] 12/17/2022, 8:17 AM Please see Amion for pager number during day hours  7:00am-4:30pm

## 2022-12-17 NOTE — Progress Notes (Signed)
Peripherally Inserted Central Catheter Placement  The IV Nurse has discussed with the patient and/or persons authorized to consent for the patient, the purpose of this procedure and the potential benefits and risks involved with this procedure.  The benefits include less needle sticks, lab draws from the catheter, and the patient may be discharged home with the catheter. Risks include, but not limited to, infection, bleeding, blood clot (thrombus formation), and puncture of an artery; nerve damage and irregular heartbeat and possibility to perform a PICC exchange if needed/ordered by physician.  Alternatives to this procedure were also discussed.  Bard Power PICC patient education guide, fact sheet on infection prevention and patient information card has been provided to patient /or left at bedside.  PICC inserted by Lazarus Gowda, RN   PICC Placement Documentation  PICC Double Lumen 12/17/22 Right Brachial 39 cm 0 cm (Active)  Indication for Insertion or Continuance of Line Administration of hyperosmolar/irritating solutions (i.e. TPN, Vancomycin, etc.);Poor Vasculature-patient has had multiple peripheral attempts or PIVs lasting less than 24 hours 12/17/22 1820  Exposed Catheter (cm) 0 cm 12/17/22 1820  Site Assessment Clean, Dry, Intact 12/17/22 1820  Lumen #1 Status Flushed;Saline locked;Blood return noted 12/17/22 1820  Lumen #2 Status Flushed;Saline locked;Blood return noted 12/17/22 1820  Dressing Type Transparent;Securing device 12/17/22 1820  Dressing Status Antimicrobial disc in place;Clean, Dry, Intact 12/17/22 1820  Safety Lock Not Applicable 12/17/22 1820  Line Care Connections checked and tightened 12/17/22 1820  Dressing Intervention New dressing 12/17/22 1820  Dressing Change Due 12/24/22 12/17/22 1820       Shawnique Mariotti, Lajean Manes 12/17/2022, 6:21 PM

## 2022-12-17 NOTE — Progress Notes (Signed)
  Transition of Care [facility]) Screening Note   Patient Details  Name: [facility] Date of Birth: 1951-04-27   Transition of Care [facility]) CM/SW Contact:    Darrold Span, RN Phone Number: 12/17/2022, 3:20 PM    Transition of Care Department [facility]) has reviewed patient and no TOC needs have been identified at this time. Note pt is being following for possible surgical intervention on this admit. We will continue to monitor patient advancement through interdisciplinary progression rounds. If new patient transition needs arise, please place a TOC consult.

## 2022-12-17 NOTE — Progress Notes (Signed)
PROGRESS NOTE    Jonathon Parker [facility]  YQM:[id number] DOB: Apr 26, 1951 DOA: 12/14/2022 PCP: Paulina Fusi, MD     Brief Narrative:  Jonathon Parker is a 72 y.o. male past medical history significant of CLL, Paroxysmal A. Fib. on Eliquis, history of colon cancer status post left hemicolectomy more than 10 years ago, GERD who was recently seen at an outside facility [facility]) about 10 days prior to admission and diagnosed with sepsis 2/2 enteritis for which he was discharged on oral antibiotics.  The patient was brought in by family with worsening abdominal pain and confusion.  Associated with watery stools.    Workup revealed findings most consistent with perforated diverticulitis of the small bowel in the right upper quadrant, no abscess formation or free fluid at this time.  The patient is currently on Zosyn.  General surgery following.  Possible exploratory surgery during this admission.  Stool study was negative for C. difficile.  Eliquis held, heparin drip started on 12/17/2022.  12/17/2022: The patient was seen and examined at his bedside.  His wife was present in the room.  States his abdominal pain is about the same it was 4 out of 10.  Denies having any nausea.  Assessment & Plan:   Principal Problem:   Enteritis Active Problems:   Sepsis (HCC)   Depression   CLL (chronic lymphocytic leukemia) (HCC)  Perforated diverticulitis of the small bowel, POA No abscess formation or free fluid seen on CT scan. Continue Zosyn Continue pain management Appreciate general surgery's assistance Possible exploratory laparotomy during this admission.  Paroxysmal A-fib on Eliquis Continue to hold Eliquis Heparin drip started on 12/17/2022. Currently rate controlled Home Lopressor on hold due to soft BPs. Continue to monitor on telemetry.   Resolved acute metabolic encephalopathy: CT was negative for any acute findings. Reorient as needed   Depression: Continue  Wellbutrin   Dementia without behavioral disturbances: Continue home aricept, seroquel   CLL (chronic lymphocytic leukemia) (HCC) No acute issues related to CLL Follow-up with hematology oncology   MAcrocytic anemia: Noted follow-up with PCP as an outpatient.  Hx CVA Continue secondary prophylaxis aspirin daily and statin  DVT prophylaxis: Heparin drip Code Status: full Family Communication: Updated the patient's wife at bedside.  Level of care: Med-Surg Status is: Inpatient Remains inpatient appropriate because: need for inpatient monitoring    Consultants:  Gen surg  Procedures: none  Antimicrobials:  zosyn    Objective: Vitals:   12/16/22 0030 12/16/22 0535 12/16/22 0757 12/16/22 1145  BP: 123/69 132/65 (!) 145/81 (!) 141/73  Pulse: 64 64 60 60  Resp: 20 20 18 18   Temp: 97.6 F (36.4 C) 97.9 F (36.6 C) 98.3 F (36.8 C)   TempSrc: Oral Oral Oral   SpO2:  95% 96% 94%  Weight:  94.1 kg    Height:        Intake/Output Summary (Last 24 hours) at 12/16/2022 1317 Last data filed at 12/16/2022 1145 Gross per 24 hour  Intake 624.8 ml  Output 2475 ml  Net -1850.2 ml   Filed Weights   12/14/22 2130 12/15/22 0600 12/16/22 0535  Weight: 95.3 kg 94.6 kg 94.1 kg    Examination:  General exam: Well-developed well-nourished in no acute distress.  He is alert and interactive. Respiratory system: Clear to auscultation no wheezes or rales Cardiovascular system: Regular rate and rhythm no rubs or gallops. Gastrointestinal system: Abdomen is mildly distended, soft and mildlly tender throughout. No organomegaly or masses felt. increased  bowel sounds heard. Central nervous system: Alert and oriented. No focal neurological deficits. Extremities: Symmetric 5 x 5 power. Skin: No rashes, lesions or ulcers Psychiatry: Mood is appropriate for condition and setting.    Data Reviewed: I have personally reviewed following labs and imaging studies  CBC: Recent Labs   Lab 12/14/22 2223 12/15/22 0925 12/16/22 0047  WBC 11.0* 8.1 5.1  HGB 12.2* 10.4* 9.6*  HCT 36.7* 31.6* 29.6*  MCV 101.9* 103.6* 103.5*  PLT 221 178 143*   Basic Metabolic Panel: Recent Labs  Lab 12/14/22 2223 12/15/22 0925 12/16/22 0047  NA 139  --  134*  K 4.4  --  3.9  CL 103  --  105  CO2 29  --  24  GLUCOSE 141*  --  99  BUN 12  --  9  CREATININE 1.00 1.02 0.92  CALCIUM 8.8*  --  8.0*   GFR: Estimated Creatinine Clearance: 86.4 mL/min (by C-G formula based on SCr of 0.92 mg/dL). Liver Function Tests: Recent Labs  Lab 12/14/22 2223 12/16/22 0047  AST 25 12*  ALT 12 15  ALKPHOS 120 80  BILITOT 0.7 1.3*  PROT 5.9* 4.8*  ALBUMIN 3.7 2.5*   Recent Labs  Lab 12/14/22 2223  LIPASE 24   No results for input(s): "AMMONIA" in the last 168 hours. Coagulation Profile: No results for input(s): "INR", "PROTIME" in the last 168 hours. Cardiac Enzymes: No results for input(s): "CKTOTAL", "CKMB", "CKMBINDEX", "TROPONINI" in the last 168 hours. BNP (last 3 results) No results for input(s): "PROBNP" in the last 8760 hours. HbA1C: No results for input(s): "HGBA1C" in the last 72 hours. CBG: Recent Labs  Lab 12/14/22 2132  GLUCAP 141*   Lipid Profile: No results for input(s): "CHOL", "HDL", "LDLCALC", "TRIG", "CHOLHDL", "LDLDIRECT" in the last 72 hours. Thyroid Function Tests: No results for input(s): "TSH", "T4TOTAL", "FREET4", "T3FREE", "THYROIDAB" in the last 72 hours. Anemia Panel: No results for input(s): "VITAMINB12", "FOLATE", "FERRITIN", "TIBC", "IRON", "RETICCTPCT" in the last 72 hours. Urine analysis:    Component Value Date/Time   COLORURINE YELLOW 12/15/2022 0030   APPEARANCEUR CLEAR 12/15/2022 0030   LABSPEC >1.046 (H) 12/15/2022 0030   PHURINE 8.5 (H) 12/15/2022 0030   GLUCOSEU NEGATIVE 12/15/2022 0030   HGBUR NEGATIVE 12/15/2022 0030   BILIRUBINUR NEGATIVE 12/15/2022 0030   KETONESUR NEGATIVE 12/15/2022 0030   PROTEINUR TRACE (A) 12/15/2022  0030   NITRITE NEGATIVE 12/15/2022 0030   LEUKOCYTESUR NEGATIVE 12/15/2022 0030   Sepsis Labs: @LABRCNTIP (procalcitonin:4,lacticidven:4)  ) Recent Results (from the past 240 hour(s))  Culture, blood (routine x 2)     Status: None (Preliminary result)   Collection Time: 12/14/22 10:24 PM   Specimen: BLOOD RIGHT ARM  Result Value Ref Range Status   Specimen Description   Final    BLOOD RIGHT ARM Performed at [facility], [address] [place], [place] [address]    Special Requests   Final    BOTTLES DRAWN AEROBIC AND ANAEROBIC Blood Culture adequate volume Performed at [facility], [address] [place], [place] [address]    Culture   Final    NO GROWTH < 12 HOURS Performed at [facility], [address] [place], [place] [address]    Report Status PENDING  Incomplete  Gastrointestinal Panel by PCR , Stool     Status: None   Collection Time: 12/15/22 12:26 PM   Specimen: STOOL  Result Value Ref Range Status   Campylobacter species NOT DETECTED  NOT DETECTED Final   Plesimonas shigelloides NOT DETECTED NOT DETECTED Final   Salmonella species NOT DETECTED NOT DETECTED Final   Yersinia enterocolitica NOT DETECTED NOT DETECTED Final   Vibrio species NOT DETECTED NOT DETECTED Final   Vibrio cholerae NOT DETECTED NOT DETECTED Final   Enteroaggregative E coli (EAEC) NOT DETECTED NOT DETECTED Final   Enteropathogenic E coli (EPEC) NOT DETECTED NOT DETECTED Final   Enterotoxigenic E coli (ETEC) NOT DETECTED NOT DETECTED Final   Shiga like toxin producing E coli (STEC) NOT DETECTED NOT DETECTED Final   Shigella/Enteroinvasive E coli (EIEC) NOT DETECTED NOT DETECTED Final   Cryptosporidium NOT DETECTED NOT DETECTED Final   Cyclospora cayetanensis NOT DETECTED NOT DETECTED Final   Entamoeba histolytica NOT DETECTED NOT DETECTED Final   Giardia lamblia NOT DETECTED NOT DETECTED Final   Adenovirus F40/41 NOT DETECTED NOT DETECTED Final   Astrovirus  NOT DETECTED NOT DETECTED Final   Norovirus GI/GII NOT DETECTED NOT DETECTED Final   Rotavirus A NOT DETECTED NOT DETECTED Final   Sapovirus (I, II, IV, and V) NOT DETECTED NOT DETECTED Final    Comment: Performed at [facility], [address] [place], [place] [address]  C Difficile Quick Screen w PCR reflex     Status: None   Collection Time: 12/15/22 12:26 PM   Specimen: STOOL  Result Value Ref Range Status   C Diff antigen NEGATIVE NEGATIVE Final   C Diff toxin NEGATIVE NEGATIVE Final   C Diff interpretation No C. difficile detected.  Final    Comment: Performed at [facility], [address] [place], [place] [address]         Radiology Studies: CT ABDOMEN PELVIS W CONTRAST  Addendum Date: 12/15/2022   ADDENDUM REPORT: 12/15/2022 00:23 ADDENDUM: These results were called by telephone at the time of interpretation on 12/15/2022 at 12:23 am to provider Dr. Judd Lien, who verbally acknowledged these results. Electronically Signed   By: Tish Frederickson M.D.   On: 12/15/2022 00:23   Result Date: 12/15/2022 CLINICAL DATA:  Abdominal pain, acute, nonlocalized EXAM: CT ABDOMEN AND PELVIS WITH CONTRAST TECHNIQUE: Multidetector CT imaging of the abdomen and pelvis was performed using the standard protocol following bolus administration of intravenous contrast. RADIATION DOSE REDUCTION: This exam was performed according to the departmental dose-optimization program which includes automated exposure control, adjustment of the mA and/or kV according to patient size and/or use of iterative reconstruction technique. CONTRAST:  85mL OMNIPAQUE IOHEXOL 300 MG/ML  SOLN COMPARISON:  CT abdomen pelvis 05/13/2019. FINDINGS: Lower chest: Cardiac leads partially visualized. Hepatobiliary: No focal liver abnormality. Status post cholecystectomy. No biliary dilatation. Pancreas: Diffusely atrophic. No focal lesion. Otherwise normal pancreatic contour. No surrounding inflammatory changes. No main  pancreatic ductal dilatation. Spleen: Normal in size without focal abnormality. Adrenals/Urinary Tract: No adrenal nodule bilaterally. Bilateral kidneys enhance symmetrically. No hydronephrosis. No hydroureter. The urinary bladder is unremarkable. On delayed imaging, there is no urothelial wall thickening and there are no filling defects in the opacified portions of the bilateral collecting systems or ureters. Stomach/Bowel: Sigmoid resection and appendectomy. Stomach is within normal limits. No evidence of large bowel wall thickening or dilatation. Multiple loops of adjacent small bowel within the right mid abdomen demonstrating bowel wall thickening, hazy bowel wall, and marked mesenteric edema. No definite transition point identified. No pneumatosis. The small bowel it is dilated up to 4 cm. Vascular/Lymphatic: No abdominal aorta or iliac aneurysm. Mild atherosclerotic plaque of the aorta and its branches. No  abdominal, pelvic, or inguinal lymphadenopathy. Reproductive: Prostate is unremarkable. Other: No intraperitoneal free fluid. No intraperitoneal free gas. No organized fluid collection. Musculoskeletal: No abdominal wall hernia or abnormality. No suspicious lytic or blastic osseous lesions. No acute displaced fracture. Intervertebral disc space narrowing at the L5-S1 level with associated grade 1 anterolisthesis of L5 on S1. IMPRESSION: 1. Marked inflammatory changes of several loops of right mid abdominal small bowel and mesentery. Differential diagnosis includes small bowel diverticulitis versus enteritis versus developing small bowel obstruction with ischemia. Contained perforation is not excluded. No definite free intraperitoneal gas. 2.  Aortic Atherosclerosis (ICD10-I70.0). Electronically Signed: By: Tish Frederickson M.D. On: 12/15/2022 00:14   CT Head Wo Contrast  Result Date: 12/15/2022 CLINICAL DATA:  Delirium EXAM: CT HEAD WITHOUT CONTRAST TECHNIQUE: Contiguous axial images were obtained from  the base of the skull through the vertex without intravenous contrast. RADIATION DOSE REDUCTION: This exam was performed according to the departmental dose-optimization program which includes automated exposure control, adjustment of the mA and/or kV according to patient size and/or use of iterative reconstruction technique. COMPARISON:  CT head 06/20/2022 FINDINGS: Brain: Cerebral ventricle sizes are concordant with the degree of cerebral volume loss. Patchy and confluent areas of decreased attenuation are noted throughout the deep and periventricular white matter of the cerebral hemispheres bilaterally, compatible with chronic microvascular ischemic disease. No evidence of large-territorial acute infarction. No parenchymal hemorrhage. No mass lesion. No extra-axial collection. No mass effect or midline shift. No hydrocephalus. Basilar cisterns are patent. Vascular: No hyperdense vessel. Skull: No acute fracture or focal lesion. Sinuses/Orbits: Right maxillary sinus mucosal thickening. Paranasal sinuses and mastoid air cells are clear. The orbits are unremarkable. Other: None. IMPRESSION: No acute intracranial abnormality. Electronically Signed   By: Tish Frederickson M.D.   On: 12/15/2022 00:16   DG Chest Portable 1 View  Result Date: 12/14/2022 CLINICAL DATA:  Fevers EXAM: PORTABLE CHEST 1 VIEW COMPARISON:  09/07/2022 FINDINGS: Cardiac shadow is stable. Pacing device is now seen in satisfactory position. Lungs are clear. No bony abnormality is noted. IMPRESSION: No acute abnormality noted. Electronically Signed   By: Alcide Clever M.D.   On: 12/14/2022 22:36        Scheduled Meds:  aspirin EC  81 mg Oral Daily   atorvastatin  20 mg Oral Daily   buPROPion  300 mg Oral Daily   colestipol  1 g Oral Daily   donepezil  5 mg Oral Daily   melatonin  3 mg Oral QHS   sertraline  25 mg Oral Daily   traZODone  100 mg Oral QHS   Continuous Infusions:  piperacillin-tazobactam (ZOSYN)  IV 3.375 g (12/16/22  1036)     LOS: 1 day     Silvano Bilis, MD [facility]   If 7PM-7AM, please contact night-coverage [email or web address] Password [email or web address] 12/16/2022, 1:17 PM

## 2022-12-17 NOTE — Progress Notes (Signed)
ANTICOAGULATION CONSULT NOTE  Pharmacy Consult for Heparin (Eliquis on hold) Indication: atrial fibrillation  Allergies  Allergen Reactions   Lisinopril Cough   Zolpidem Other (See Comments)    "Got me in outer space"     Patient Measurements: Height: 6' (182.9 cm) Weight: 90.6 kg (199 lb 11.8 oz) IBW/kg (Calculated) : 77.6 Heparin Dosing Weight: 90 kg  Vital Signs: Temp: 98.9 F (37.2 C) (05/14 2005) Temp Source: Oral (05/14 2005) BP: 142/92 (05/14 2005) Pulse Rate: 65 (05/14 2005)  Labs: Recent Labs    12/15/22 0925 12/16/22 0047 12/17/22 0116 12/17/22 2027  HGB 10.4* 9.6* 9.7*  --   HCT 31.6* 29.6* 29.2*  --   PLT 178 143* 154  --   APTT  --   --   --  43*  CREATININE 1.02 0.92 0.99  --      Estimated Creatinine Clearance: 74 mL/min (by C-G formula based on SCr of 0.99 mg/dL).   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 Valentino Hue in [facility]     Depression    GERD (gastroesophageal reflux disease)    controlled with nexium    Hypertension    Pacemaker    Assessment: 72 yr old male on Eliquis 5 mg BID prior to admission for atrial fibrillation.  Eliquis stopped after 8am dose on 5/13 for possible surgery. To begin IV heparin today.  Surgery notes need to hold IV heparin at midnight for OR. Possible exploratory surgery on 5/15, but not yet scheduled.   Will use aPTTs for heparin monitoring while heparin levels are expected to be falsely elevated due to recent Eliquis doses.   aPTT subtherapeutic at 43 on heparin 1200 units/hr. No bleeding or line issues reported per d/w RN.   Goal of Therapy:  Heparin level 0.3-0.7 units/ml aPTT 66-102 seconds Monitor platelets by anticoagulation protocol: Yes   Plan:  Increase heparin drip to 1350 units/hr Heparin to stop at midnight tonight for  surgery on 5/15. Will follow up anticoagulation plans post op  Loralee Pacas, PharmD, BCPS Please see amion for complete clinical pharmacist phone list 12/17/2022,9:25 PM

## 2022-12-18 ENCOUNTER — Other Ambulatory Visit: Payer: Self-pay

## 2022-12-18 ENCOUNTER — Encounter (HOSPITAL_COMMUNITY)
Admission: EM | Disposition: A | Discharge status: Skilled Nursing Facility | Payer: Self-pay | Source: Home / Self Care | Attending: Internal Medicine

## 2022-12-18 ENCOUNTER — Inpatient Hospital Stay (HOSPITAL_COMMUNITY): Payer: Medicare PPO

## 2022-12-18 ENCOUNTER — Inpatient Hospital Stay (HOSPITAL_COMMUNITY): Payer: Medicare PPO | Admitting: Anesthesiology

## 2022-12-18 ENCOUNTER — Encounter (HOSPITAL_COMMUNITY): Payer: Self-pay | Admitting: Internal Medicine

## 2022-12-18 DIAGNOSIS — I480 Paroxysmal atrial fibrillation: Secondary | ICD-10-CM

## 2022-12-18 DIAGNOSIS — K660 Peritoneal adhesions (postprocedural) (postinfection): Secondary | ICD-10-CM

## 2022-12-18 DIAGNOSIS — I10 Essential (primary) hypertension: Secondary | ICD-10-CM | POA: Diagnosis not present

## 2022-12-18 DIAGNOSIS — K631 Perforation of intestine (nontraumatic): Secondary | ICD-10-CM

## 2022-12-18 DIAGNOSIS — E440 Moderate protein-calorie malnutrition: Secondary | ICD-10-CM | POA: Insufficient documentation

## 2022-12-18 HISTORY — PX: LAPAROTOMY: SHX154

## 2022-12-18 LAB — SURGICAL PCR SCREEN
MRSA, PCR: NEGATIVE
Staphylococcus aureus: NEGATIVE

## 2022-12-18 LAB — GLUCOSE, CAPILLARY
Glucose-Capillary: 127 mg/dL — ABNORMAL HIGH (ref 70–99)
Glucose-Capillary: 157 mg/dL — ABNORMAL HIGH (ref 70–99)
Glucose-Capillary: 174 mg/dL — ABNORMAL HIGH (ref 70–99)
Glucose-Capillary: 87 mg/dL (ref 70–99)

## 2022-12-18 LAB — TYPE AND SCREEN
ABO/RH(D): A POS
Antibody Screen: NEGATIVE

## 2022-12-18 LAB — PHOSPHORUS: Phosphorus: 4.3 mg/dL (ref 2.5–4.6)

## 2022-12-18 LAB — COMPREHENSIVE METABOLIC PANEL
ALT: 16 U/L (ref 0–44)
AST: 14 U/L — ABNORMAL LOW (ref 15–41)
Albumin: 2.7 g/dL — ABNORMAL LOW (ref 3.5–5.0)
Alkaline Phosphatase: 145 U/L — ABNORMAL HIGH (ref 38–126)
Anion gap: 12 (ref 5–15)
BUN: 8 mg/dL (ref 8–23)
CO2: 25 mmol/L (ref 22–32)
Calcium: 8.7 mg/dL — ABNORMAL LOW (ref 8.9–10.3)
Chloride: 99 mmol/L (ref 98–111)
Creatinine, Ser: 1.06 mg/dL (ref 0.61–1.24)
GFR, Estimated: >60 mL/min (ref >60)
Glucose, Bld: 84 mg/dL (ref 70–99)
Potassium: 3.9 mmol/L (ref 3.5–5.1)
Sodium: 136 mmol/L (ref 135–145)
Total Bilirubin: 0.9 mg/dL (ref 0.3–1.2)
Total Protein: 6 g/dL — ABNORMAL LOW (ref 6.5–8.1)

## 2022-12-18 LAB — CBC
HCT: 30.7 % — ABNORMAL LOW (ref 39.0–52.0)
Hemoglobin: 10.4 g/dL — ABNORMAL LOW (ref 13.0–17.0)
MCH: 33.4 pg (ref 26.0–34.0)
MCHC: 33.9 g/dL (ref 30.0–36.0)
MCV: 98.7 fL (ref 80.0–100.0)
Platelets: 188 10*3/uL (ref 150–400)
RBC: 3.11 MIL/uL — ABNORMAL LOW (ref 4.22–5.81)
RDW: 12.4 % (ref 11.5–15.5)
WBC: 4.4 10*3/uL (ref 4.0–10.5)
nRBC: 0 % (ref 0.0–0.2)

## 2022-12-18 LAB — MAGNESIUM: Magnesium: 2.2 mg/dL (ref 1.7–2.4)

## 2022-12-18 SURGERY — LAPAROTOMY, EXPLORATORY
Anesthesia: General | Site: Abdomen

## 2022-12-18 MED ORDER — PHENYLEPHRINE 80 MCG/ML (10ML) SYRINGE FOR IV PUSH (FOR BLOOD PRESSURE SUPPORT)
PREFILLED_SYRINGE | INTRAVENOUS | Status: AC
  Filled 2022-12-18: qty 10

## 2022-12-18 MED ORDER — TRAVASOL 10 % IV SOLN
INTRAVENOUS | Status: AC
  Filled 2022-12-18: qty 528

## 2022-12-18 MED ORDER — 0.9 % SODIUM CHLORIDE (POUR BTL) OPTIME
TOPICAL | Status: DC | PRN
  Administered 2022-12-18: 4000 mL

## 2022-12-18 MED ORDER — PROPOFOL 10 MG/ML IV BOLUS
INTRAVENOUS | Status: AC
  Filled 2022-12-18: qty 20

## 2022-12-18 MED ORDER — ACETAMINOPHEN 10 MG/ML IV SOLN
1000.0000 mg | Freq: Four times a day (QID) | INTRAVENOUS | Status: AC
  Administered 2022-12-18 – 2022-12-19 (×4): 1000 mg via INTRAVENOUS
  Filled 2022-12-18 (×4): qty 100

## 2022-12-18 MED ORDER — THIAMINE HCL 100 MG/ML IJ SOLN
100.0000 mg | Freq: Every day | INTRAMUSCULAR | Status: AC
  Administered 2022-12-19 – 2022-12-22 (×4): 100 mg via INTRAVENOUS
  Filled 2022-12-18 (×5): qty 2

## 2022-12-18 MED ORDER — PROPOFOL 10 MG/ML IV BOLUS
INTRAVENOUS | Status: DC | PRN
  Administered 2022-12-18: 30 mg via INTRAVENOUS
  Administered 2022-12-18: 40 mg via INTRAVENOUS
  Administered 2022-12-18: 130 mg via INTRAVENOUS

## 2022-12-18 MED ORDER — CHLORHEXIDINE GLUCONATE 0.12 % MT SOLN
15.0000 mL | Freq: Once | OROMUCOSAL | Status: AC
  Administered 2022-12-18: 15 mL via OROMUCOSAL
  Filled 2022-12-18: qty 15

## 2022-12-18 MED ORDER — ONDANSETRON HCL 4 MG/2ML IJ SOLN
INTRAMUSCULAR | Status: DC | PRN
  Administered 2022-12-18: 4 mg via INTRAVENOUS

## 2022-12-18 MED ORDER — ACETAMINOPHEN 10 MG/ML IV SOLN
INTRAVENOUS | Status: AC
  Filled 2022-12-18: qty 100

## 2022-12-18 MED ORDER — METHOCARBAMOL 1000 MG/10ML IJ SOLN
500.0000 mg | Freq: Four times a day (QID) | INTRAVENOUS | Status: DC
  Administered 2022-12-18 – 2022-12-19 (×4): 500 mg via INTRAVENOUS
  Filled 2022-12-18: qty 500
  Filled 2022-12-18: qty 5
  Filled 2022-12-18: qty 500
  Filled 2022-12-18 (×2): qty 5
  Filled 2022-12-18: qty 500
  Filled 2022-12-18: qty 5

## 2022-12-18 MED ORDER — ACETAMINOPHEN 10 MG/ML IV SOLN
INTRAVENOUS | Status: DC | PRN
  Administered 2022-12-18: 1000 mg via INTRAVENOUS

## 2022-12-18 MED ORDER — FENTANYL CITRATE (PF) 100 MCG/2ML IJ SOLN
25.0000 ug | INTRAMUSCULAR | Status: DC | PRN
  Administered 2022-12-18: 50 ug via INTRAVENOUS

## 2022-12-18 MED ORDER — DEXAMETHASONE SODIUM PHOSPHATE 10 MG/ML IJ SOLN
INTRAMUSCULAR | Status: AC
  Filled 2022-12-18: qty 1

## 2022-12-18 MED ORDER — HYDROMORPHONE HCL 1 MG/ML IJ SOLN
0.5000 mg | INTRAMUSCULAR | Status: DC | PRN
  Administered 2022-12-18 – 2022-12-20 (×8): 1 mg via INTRAVENOUS
  Filled 2022-12-18 (×7): qty 1
  Filled 2022-12-18: qty 0.5
  Filled 2022-12-18: qty 1

## 2022-12-18 MED ORDER — SODIUM CHLORIDE 0.9 % IV SOLN: INTRAVENOUS | Status: DC

## 2022-12-18 MED ORDER — ROCURONIUM BROMIDE 10 MG/ML (PF) SYRINGE
PREFILLED_SYRINGE | INTRAVENOUS | Status: DC | PRN
  Administered 2022-12-18: 30 mg via INTRAVENOUS
  Administered 2022-12-18: 50 mg via INTRAVENOUS

## 2022-12-18 MED ORDER — INSULIN ASPART 100 UNIT/ML IJ SOLN
0.0000 [IU] | Freq: Four times a day (QID) | INTRAMUSCULAR | Status: DC
  Administered 2022-12-18 – 2022-12-19 (×3): 2 [IU] via SUBCUTANEOUS
  Administered 2022-12-19 – 2022-12-21 (×3): 1 [IU] via SUBCUTANEOUS
  Administered 2022-12-21: 2 [IU] via SUBCUTANEOUS
  Administered 2022-12-22 (×3): 1 [IU] via SUBCUTANEOUS
  Administered 2022-12-22: 2 [IU] via SUBCUTANEOUS
  Administered 2022-12-23: 3 [IU] via SUBCUTANEOUS
  Administered 2022-12-23: 1 [IU] via SUBCUTANEOUS
  Administered 2022-12-23 – 2022-12-24 (×2): 2 [IU] via SUBCUTANEOUS
  Administered 2022-12-24: 1 [IU] via SUBCUTANEOUS
  Administered 2022-12-24: 2 [IU] via SUBCUTANEOUS
  Administered 2022-12-25 (×4): 1 [IU] via SUBCUTANEOUS

## 2022-12-18 MED ORDER — KETAMINE HCL 10 MG/ML IJ SOLN
INTRAMUSCULAR | Status: DC | PRN
  Administered 2022-12-18: 10 mg via INTRAVENOUS
  Administered 2022-12-18: 30 mg via INTRAVENOUS

## 2022-12-18 MED ORDER — LABETALOL HCL 5 MG/ML IV SOLN
INTRAVENOUS | Status: AC
  Filled 2022-12-18: qty 4

## 2022-12-18 MED ORDER — ORAL CARE MOUTH RINSE: 15.0000 mL | Freq: Once | OROMUCOSAL | Status: AC

## 2022-12-18 MED ORDER — FENTANYL CITRATE (PF) 250 MCG/5ML IJ SOLN
INTRAMUSCULAR | Status: DC | PRN
  Administered 2022-12-18 (×5): 50 ug via INTRAVENOUS

## 2022-12-18 MED ORDER — KETAMINE HCL 50 MG/5ML IJ SOSY
PREFILLED_SYRINGE | INTRAMUSCULAR | Status: AC
  Filled 2022-12-18: qty 5

## 2022-12-18 MED ORDER — FENTANYL CITRATE (PF) 100 MCG/2ML IJ SOLN
25.0000 ug | INTRAMUSCULAR | Status: DC | PRN
Start: 2022-12-18 — End: 2022-12-18
  Administered 2022-12-18: 50 ug via INTRAVENOUS

## 2022-12-18 MED ORDER — SUGAMMADEX SODIUM 200 MG/2ML IV SOLN
INTRAVENOUS | Status: DC | PRN
  Administered 2022-12-18: 200 mg via INTRAVENOUS
  Administered 2022-12-18: 100 mg via INTRAVENOUS

## 2022-12-18 MED ORDER — ALBUMIN HUMAN 5 % IV SOLN: INTRAVENOUS | Status: DC | PRN

## 2022-12-18 MED ORDER — ROCURONIUM BROMIDE 10 MG/ML (PF) SYRINGE
PREFILLED_SYRINGE | INTRAVENOUS | Status: AC
  Filled 2022-12-18: qty 10

## 2022-12-18 MED ORDER — PHENYLEPHRINE 80 MCG/ML (10ML) SYRINGE FOR IV PUSH (FOR BLOOD PRESSURE SUPPORT)
PREFILLED_SYRINGE | INTRAVENOUS | Status: DC | PRN
  Administered 2022-12-18: 160 ug via INTRAVENOUS

## 2022-12-18 MED ORDER — LIDOCAINE 2% (20 MG/ML) 5 ML SYRINGE
INTRAMUSCULAR | Status: AC
  Filled 2022-12-18: qty 5

## 2022-12-18 MED ORDER — DEXMEDETOMIDINE HCL IN NACL 80 MCG/20ML IV SOLN
INTRAVENOUS | Status: DC | PRN
  Administered 2022-12-18: 8 ug via INTRAVENOUS
  Administered 2022-12-18: 4 ug via INTRAVENOUS
  Administered 2022-12-18: 8 ug via INTRAVENOUS

## 2022-12-18 MED ORDER — ONDANSETRON HCL 4 MG/2ML IJ SOLN
INTRAMUSCULAR | Status: AC
  Filled 2022-12-18: qty 2

## 2022-12-18 MED ORDER — LIDOCAINE 2% (20 MG/ML) 5 ML SYRINGE
INTRAMUSCULAR | Status: DC | PRN
  Administered 2022-12-18: 60 mg via INTRAVENOUS

## 2022-12-18 MED ORDER — FENTANYL CITRATE (PF) 250 MCG/5ML IJ SOLN
INTRAMUSCULAR | Status: AC
  Filled 2022-12-18: qty 5

## 2022-12-18 MED ORDER — FENTANYL CITRATE (PF) 100 MCG/2ML IJ SOLN
INTRAMUSCULAR | Status: AC
  Filled 2022-12-18: qty 2

## 2022-12-18 MED ORDER — DEXAMETHASONE SODIUM PHOSPHATE 10 MG/ML IJ SOLN
INTRAMUSCULAR | Status: DC | PRN
  Administered 2022-12-18: 5 mg via INTRAVENOUS

## 2022-12-18 MED ORDER — LABETALOL HCL 5 MG/ML IV SOLN
INTRAVENOUS | Status: DC | PRN
  Administered 2022-12-18: 2.5 mg via INTRAVENOUS

## 2022-12-18 MED ORDER — LACTATED RINGERS IV SOLN: INTRAVENOUS | Status: DC

## 2022-12-18 SURGICAL SUPPLY — 44 items
APL PRP STRL LF DISP 70% ISPRP (MISCELLANEOUS) ×1
BAG COUNTER SPONGE SURGICOUNT (BAG) ×1 IMPLANT
BAG SPNG CNTER NS LX DISP (BAG) ×1
BLADE CLIPPER SURG (BLADE) IMPLANT
CANISTER SUCT 3000ML PPV (MISCELLANEOUS) ×1 IMPLANT
CHLORAPREP W/TINT 26 (MISCELLANEOUS) ×1 IMPLANT
COVER SURGICAL LIGHT HANDLE (MISCELLANEOUS) ×1 IMPLANT
DRAPE LAPAROSCOPIC ABDOMINAL (DRAPES) ×1 IMPLANT
DRAPE WARM FLUID 44X44 (DRAPES) ×1 IMPLANT
DRSG OPSITE POSTOP 4X10 (GAUZE/BANDAGES/DRESSINGS) IMPLANT
DRSG OPSITE POSTOP 4X8 (GAUZE/BANDAGES/DRESSINGS) IMPLANT
ELECT BLADE 6.5 EXT (BLADE) IMPLANT
ELECT CAUTERY BLADE 6.4 (BLADE) ×2 IMPLANT
ELECT REM PT RETURN 9FT ADLT (ELECTROSURGICAL) ×1
ELECTRODE REM PT RTRN 9FT ADLT (ELECTROSURGICAL) ×1 IMPLANT
GLOVE BIO SURGEON STRL SZ7 (GLOVE) ×1 IMPLANT
GLOVE BIOGEL PI IND STRL 7.5 (GLOVE) ×1 IMPLANT
GOWN STRL REUS W/ TWL LRG LVL3 (GOWN DISPOSABLE) ×2 IMPLANT
GOWN STRL REUS W/TWL LRG LVL3 (GOWN DISPOSABLE) ×3
HANDLE SUCTION POOLE (INSTRUMENTS) ×1 IMPLANT
KIT BASIN OR (CUSTOM PROCEDURE TRAY) ×1 IMPLANT
KIT TURNOVER KIT B (KITS) ×1 IMPLANT
LIGASURE IMPACT 36 18CM CVD LR (INSTRUMENTS) IMPLANT
NS IRRIG 1000ML POUR BTL (IV SOLUTION) ×2 IMPLANT
PACK GENERAL/GYN (CUSTOM PROCEDURE TRAY) ×1 IMPLANT
PAD ARMBOARD 7.5X6 YLW CONV (MISCELLANEOUS) ×1 IMPLANT
PENCIL SMOKE EVACUATOR (MISCELLANEOUS) ×1 IMPLANT
RELOAD PROXIMATE 75MM BLUE (ENDOMECHANICALS) ×3 IMPLANT
RELOAD STAPLE 75 3.8 BLU REG (ENDOMECHANICALS) IMPLANT
SPECIMEN JAR LARGE (MISCELLANEOUS) IMPLANT
SPONGE T-LAP 18X18 ~~LOC~~+RFID (SPONGE) IMPLANT
STAPLER GUN LINEAR PROX 60 (STAPLE) IMPLANT
STAPLER PROXIMATE 75MM BLUE (STAPLE) IMPLANT
STAPLER VISISTAT 35W (STAPLE) ×1 IMPLANT
SUCTION POOLE HANDLE (INSTRUMENTS) ×1
SUT PDS AB 1 TP1 96 (SUTURE) ×2 IMPLANT
SUT SILK 2 0 SH CR/8 (SUTURE) ×1 IMPLANT
SUT SILK 2 0 TIES 10X30 (SUTURE) ×1 IMPLANT
SUT SILK 3 0 SH CR/8 (SUTURE) ×1 IMPLANT
SUT SILK 3 0 TIES 10X30 (SUTURE) ×1 IMPLANT
SUT VIC AB 3-0 SH 18 (SUTURE) IMPLANT
TOWEL GREEN STERILE (TOWEL DISPOSABLE) ×1 IMPLANT
TRAY FOLEY MTR SLVR 16FR STAT (SET/KITS/TRAYS/PACK) IMPLANT
YANKAUER SUCT BULB TIP NO VENT (SUCTIONS) IMPLANT

## 2022-12-18 NOTE — Anesthesia Preprocedure Evaluation (Addendum)
Anesthesia Evaluation  Patient identified by MRN, date of birth, ID band Patient awake    Reviewed: Allergy & Precautions, NPO status , Patient's Chart, lab work & pertinent test results, reviewed documented beta blocker date and time   Airway Mallampati: II  TM Distance: >3 FB Neck ROM: Full    Dental  (+) Poor Dentition, Missing, Chipped, Dental Advisory Given,    Pulmonary shortness of breath, asthma    Pulmonary exam normal breath sounds clear to auscultation       Cardiovascular hypertension, Pt. on home beta blockers and Pt. on medications Normal cardiovascular exam+ dysrhythmias Atrial Fibrillation + pacemaker  Rhythm:Regular Rate:Normal     Neuro/Psych  PSYCHIATRIC DISORDERS  Depression    CVA, Residual Symptoms    GI/Hepatic Neg liver ROS,GERD  ,,  Endo/Other  negative endocrine ROS    Renal/GU negative Renal ROS  negative genitourinary   Musculoskeletal  (+) Arthritis ,    Abdominal   Peds  Hematology  (+) Blood dyscrasia (eliquis) CLL   Anesthesia Other Findings S/P [employer] DC PPM with conduction system pacing (11/12/2022, Dr. Janee Morn) Tachycardia-bradycardia syndrome Paroxysmal atrial fibrillation Chronic anticoagulation Eliquis, CHA2DS2-VASc: At least 4 (age, HTN, CVA) Preserved LVEF: 50 to 55%, moderate RV dilation (11/01/2022) Negative NM stress test for ischemia or infarction (02/08/2022) CLL s/p acalabrutinib, venetoclax and obinutuzumab Hx of CVA with residual paresthesia Hypertension Dyslipidemia    Reproductive/Obstetrics                             Anesthesia Physical Anesthesia Plan  ASA: 3  Anesthesia Plan: General   Post-op Pain Management: Ketamine IV* and Ofirmev IV (intra-op)*   Induction: Intravenous  PONV Risk Score and Plan: 2 and Dexamethasone, Ondansetron and Treatment may vary due to age or medical condition  Airway Management Planned: Oral  ETT  Additional Equipment:   Intra-op Plan:   Post-operative Plan: Extubation in OR  Informed Consent: I have reviewed the patients History and Physical, chart, labs and discussed the procedure including the risks, benefits and alternatives for the proposed anesthesia with the patient or authorized representative who has indicated his/her understanding and acceptance.     Dental advisory given  Plan Discussed with: CRNA  Anesthesia Plan Comments:        Anesthesia Quick Evaluation

## 2022-12-18 NOTE — Anesthesia Postprocedure Evaluation (Signed)
Anesthesia Post Note  Patient: Kenzi Zambo [facility]  Procedure(s) Performed: EXPLORATORY LAPAROTOMY, BOWEL RESECTION (Abdomen)     Patient location during evaluation: PACU Anesthesia Type: General Level of consciousness: awake and alert Pain management: pain level controlled Vital Signs Assessment: post-procedure vital signs reviewed and stable Respiratory status: spontaneous breathing, nonlabored ventilation, respiratory function stable and patient connected to nasal cannula oxygen Cardiovascular status: blood pressure returned to baseline and stable Postop Assessment: no apparent nausea or vomiting Anesthetic complications: no  No notable events documented.  Last Vitals:  Vitals:   12/18/22 1423 12/18/22 1440  BP: (!) 152/96 (!) 141/81  Pulse: 70 66  Resp: 11 13  Temp: 36.4 C 36.7 C  SpO2: 94% 95%    Last Pain:  Vitals:   12/18/22 1440  TempSrc: Oral  PainSc:                  Francessca Friis L Yahmir Sokolov

## 2022-12-18 NOTE — Interval H&P Note (Signed)
History and Physical Interval Note:  12/18/2022 11:14 AM  Jonathon Parker  has presented today for surgery, with the diagnosis of CONTAINED SMALL BOWEL PERFORATION.  The various methods of treatment have been discussed with the patient and family. After consideration of risks, benefits and other options for treatment, the patient has consented to  Procedure(s): EXPLORATORY LAPAROTOMY POSSIBLE BOWEL RESECTION (N/A) as a surgical intervention.  The patient's history has been reviewed, patient examined, no change in status, stable for surgery.  I have reviewed the patient's chart and labs.  Questions were answered to the patient's satisfaction.     Wynona Luna

## 2022-12-18 NOTE — Progress Notes (Signed)
Pt refusing cpap for the night. ?

## 2022-12-18 NOTE — Care Management Important Message (Signed)
Important Message  Patient Details  Name: Jonathon Parker MRN: [id number] Date of Birth: 26-Aug-1950   Medicare Important Message Given:  Yes     [name] Stefan Church 12/18/2022, 3:36 PM

## 2022-12-18 NOTE — Op Note (Signed)
Pre-op diagnosis: Contained perforation of jejunal diverticulum Postop diagnosis: Same Procedure performed: Exploratory laparotomy, lysis of adhesions, small bowel resection with primary anastomosis Surgeon:Aryella Besecker K Nello Corro Assistant: Dr. Chevis Pretty Anesthesia: General endotracheal Indications: This is a 72 year old male with a history of CLL who presented with approximately 1 month of intermittent abdominal symptoms.  He was hospitalized at another hospital for treatment for "enteritis".  He was discharged and was admitted to our hospital 3 days ago.  He had abdominal pain, nausea, vomiting.  He was having some diarrhea.  CT scan initially showed some small bowel in the right mid abdomen with some wall thickening and mesenteric edema.  Follow-up CT scan yesterday revealed multiple dots of extraluminal air consistent with perforated small bowel diverticulitis that was contained.  We recommended exploratory laparotomy and bowel resection.  Description of procedure: The patient was brought to the operating room placed in the supine position on the operating room table.  After an adequate level of general anesthesia was obtained, a Foley catheter was placed under sterile technique.  The patient's abdomen was prepped with ChloraPrep and draped sterile fashion.  A timeout was taken to ensure the proper patient and proper procedure.  We made a midline incision in the upper abdomen.  The patient has a long lower midline incision from previous colon surgery.  We dissected down to the linea alba with cautery.  We carefully divided the linea alba.  We bluntly entered the peritoneal cavity.  The omentum was densely adherent to the anterior abdominal wall.  We carefully dissected the omentum away from the anterior abdominal wall to allow for entry into the peritoneal cavity.  We could palpate a large mass of matted small bowel in the right upper quadrant consistent with the CT scan.  There is no free purulent fluid within  the abdomen.  We placed a Balfour retractor.  We were able to finger fracture some adjacent small bowel away from the inflammatory mass.  This allowed [place] to mobilize the inflammatory mass up into the field.  This seemed to involve several loops of small bowel around a large area of inflammation.  We decided not to open the area of inflammation as it likely contains purulent fluid.  The small bowel proximal and distal to this area appears relatively normal.  There are some visible jejunal diverticuli.  We decided to perform a small bowel resection.  We divided proximal and distal to this area of inflammation with a GIA 75 stapler.  We included any visible diverticula.  The mesentery was taken with the LigaSure device.  The specimen was passed off the field and sent for pathology.  We then inspected the remaining small bowel.  Our resection is approximately 2 feet distal to the ligament of Treitz.  Approximately 10 inches distal to our staple line, there is an area that had secondarily been involved with the area of inflammation.  The wall of this area small bowel appeared thinned.  I made the decision to resect this segment as well.  We divided distal to this involved area with another load of the GIA 75.  We divided the mesentery with LigaSure.  We then created a side-to-side anastomosis of the small bowel with a GIA 75 and a TX 60 stapler.  A crotch suture 3-0 silk was placed.  We closed the mesenteric defect with 2-0 silk.  The anastomosis is palpated and is patent.  We irrigated the abdomen thoroughly and inspected for hemostasis.  Of note the patient  was on Eliquis and has been on a heparin drip for the last 2 days.  There was noticeable amount of oozing throughout the case.  This seemed to improve towards the end of the case.  We inspected for any surgical bleeding and did not find any vessels that need to be ligated.  We had an accurate sponge count.  We then closed the fascia with double-stranded #1 PDS  suture.  The subcutaneous tissues were irrigated.  Staples were used to close the skin.  Sterile dressing is applied.  The patient is then extubated and brought to the recovery room in stable condition.  All sponge, instrument, and needle counts are correct.  Wilmon Arms. Corliss Skains, MD, [facility] Surgery  General Surgery   12/18/2022 2:15 PM    CASE DATA:  Type of patient?: DOW CASE (Surgical Hospitalist [facility] Inpatient)  Status of Case? URGENT Add On  Infection Present At Time Of Surgery (PATOS)?  ABSCESS in the mesentery of the small bowel

## 2022-12-18 NOTE — Anesthesia Procedure Notes (Signed)
Procedure Name: Intubation Date/Time: 12/18/2022 12:11 PM  Performed by: Katina Degree, CRNAPre-anesthesia Checklist: Patient identified, Emergency Drugs available, Suction available and Patient being monitored Patient Re-evaluated:Patient Re-evaluated prior to induction Oxygen Delivery Method: Circle system utilized Preoxygenation: Pre-oxygenation with 100% oxygen Induction Type: IV induction Ventilation: Oral airway inserted - appropriate to patient size and Two handed mask ventilation required Laryngoscope Size: 4 and Mac Grade View: Grade I Tube type: Oral Tube size: 7.5 mm Number of attempts: 1 Airway Equipment and Method: Stylet and Oral airway Placement Confirmation: ETT inserted through vocal cords under direct vision, positive ETCO2 and breath sounds checked- equal and bilateral Secured at: 22 cm Tube secured with: Tape Dental Injury: Teeth and Oropharynx as per pre-operative assessment

## 2022-12-18 NOTE — Progress Notes (Signed)
PROGRESS NOTE    Jonathon Parker [facility]  ZOX:[id number] DOB: 1950/11/19 DOA: 12/14/2022 PCP: Paulina Fusi, MD   Brief Narrative:   72 y.o. male past medical history significant of CLL, Paroxysmal A. Fib. on Eliquis, history of colon cancer status post left hemicolectomy more than 10 years ago, GERD who was recently seen at an outside facility [facility]) about 10 days prior to admission and diagnosed with sepsis 2/2 enteritis for which he was discharged on oral antibiotics.  The patient was brought in by family with worsening abdominal pain and confusion.  Associated with watery stools.     Workup revealed findings most consistent with perforated diverticulitis of the small bowel in the right upper quadrant, no abscess formation or free fluid at this time.  The patient is currently on Zosyn.  General surgery following.  Possible exploratory surgery during this admission.  Stool study was negative for C. difficile.  Eliquis held, heparin drip started on 12/17/2022.   Assessment & Plan:  Principal Problem:   Enteritis Active Problems:   Sepsis (HCC)   Depression   CLL (chronic lymphocytic leukemia) (HCC)    Perforated diverticulitis of the small bowel, POA No obvious evidence of abscess.  On IV Zosyn.  Plans for exploratory laparotomy by general surgery, hopefully today.   Paroxysmal A-fib on Eliquis Eliquis is on hold, on heparin drip.  Rate control   Resolved acute metabolic encephalopathy: CT was negative for any acute findings. 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   Hx CVA Aspirin, Lipitor  DVT prophylaxis: Heparin drip Code Status: Full code Family Communication:  Spouse updated.  Status  is: Inpatient Ongoing evaluation and management for perforated diverticulitis       Diet Orders (From admission, onward)     Start     Ordered   12/18/22 0001  Diet NPO time specified Except for: Sips with Meds  Diet effective midnight       Question:  Except for  Answer:  Clearance Coots with Meds   12/17/22 0850            Subjective:  Plans for surgery today. Wife at bedside.   Examination:  Unable to examine  Objective: Vitals:   12/17/22 2005 12/17/22 2345 12/18/22 0326 12/18/22 0707  BP: (!) 142/92 131/79 (!) 140/88 (!) 140/80  Pulse: 65 68 66 68  Resp: 19 18 16 16   Temp: 98.9 F (37.2 C) 97.6 F (36.4 C) 98.3 F (36.8 C) 97.6 F (36.4 C)  TempSrc: Oral Oral Oral Oral  SpO2: 94% 94% 95% 95%  Weight:   90.9 kg   Height:        Intake/Output Summary (Last 24 hours) at 12/18/2022 0903 Last data filed at 12/18/2022 0327 Gross per 24 hour  Intake 399.78 ml  Output 2000 ml  Net -1600.22 ml   Filed Weights   12/16/22 0535 12/17/22 0349 12/18/22 0326  Weight: 94.1 kg 90.6 kg 90.9 kg    Scheduled Meds:  aspirin EC  81 mg Oral Daily   atorvastatin  20 mg Oral Daily   buPROPion  300 mg Oral Daily   Chlorhexidine Gluconate Cloth  6 each Topical Daily   colestipol  1 g Oral Daily   donepezil  5 mg Oral  Daily   melatonin  3 mg Oral QHS   sertraline  25 mg Oral Daily   sodium chloride flush  10-40 mL Intracatheter Q12H   traZODone  100 mg Oral QHS   Continuous Infusions:  piperacillin-tazobactam (ZOSYN)  IV 3.375 g (12/18/22 0322)    Nutritional status     Body mass index is 27.18 kg/m.  Data Reviewed:   CBC: Recent Labs  Lab 12/14/22 2223 12/15/22 0925 12/16/22 0047 12/17/22 0116 12/18/22 0330  WBC 11.0* 8.1 5.1 4.1 4.4  HGB 12.2* 10.4* 9.6* 9.7* 10.4*  HCT 36.7* 31.6* 29.6* 29.2* 30.7*  MCV 101.9* 103.6* 103.5* 100.7* 98.7  PLT 221 178 143* 154 188   Basic Metabolic Panel: Recent Labs  Lab 12/14/22 2223 12/15/22 0925 12/16/22 0047  12/17/22 0116 12/18/22 0330  NA 139  --  134* 135 136  K 4.4  --  3.9 3.9 3.9  CL 103  --  105 104 99  CO2 29  --  24 24 25   GLUCOSE 141*  --  99 99 84  BUN 12  --  9 7* 8  CREATININE 1.00 1.02 0.92 0.99 1.06  CALCIUM 8.8*  --  8.0* 8.5* 8.7*  MG  --   --   --   --  2.2  PHOS  --   --   --   --  4.3   GFR: Estimated Creatinine Clearance: 69.1 mL/min (by C-G formula based on SCr of 1.06 mg/dL). Liver Function Tests: Recent Labs  Lab 12/14/22 2223 12/16/22 0047 12/18/22 0330  AST 25 12* 14*  ALT 12 15 16   ALKPHOS 120 80 145*  BILITOT 0.7 1.3* 0.9  PROT 5.9* 4.8* 6.0*  ALBUMIN 3.7 2.5* 2.7*   Recent Labs  Lab 12/14/22 2223  LIPASE 24   No results for input(s): "AMMONIA" in the last 168 hours. Coagulation Profile: No results for input(s): "INR", "PROTIME" in the last 168 hours. Cardiac Enzymes: No results for input(s): "CKTOTAL", "CKMB", "CKMBINDEX", "TROPONINI" in the last 168 hours. BNP (last 3 results) No results for input(s): "PROBNP" in the last 8760 hours. HbA1C: No results for input(s): "HGBA1C" in the last 72 hours. CBG: Recent Labs  Lab 12/14/22 2132  GLUCAP 141*   Lipid Profile: No results for input(s): "CHOL", "HDL", "LDLCALC", "TRIG", "CHOLHDL", "LDLDIRECT" in the last 72 hours. Thyroid Function Tests: No results for input(s): "TSH", "T4TOTAL", "FREET4", "T3FREE", "THYROIDAB" in the last 72 hours. Anemia Panel: No results for input(s): "VITAMINB12", "FOLATE", "FERRITIN", "TIBC", "IRON", "RETICCTPCT" in the last 72 hours. Sepsis Labs: Recent Labs  Lab 12/14/22 2223  LATICACIDVEN 1.7    Recent Results (from the past 240 hour(s))  Culture, blood (routine x 2)     Status: None (Preliminary result)   Collection Time: 12/14/22 10:24 PM   Specimen: BLOOD RIGHT ARM  Result Value Ref Range Status   Specimen Description   Final    BLOOD RIGHT ARM Performed at [facility], [address] [place], [place] [address]    Special Requests   Final     BOTTLES DRAWN AEROBIC AND ANAEROBIC Blood Culture adequate volume Performed at [facility], [address], [place], [place] [address]    Culture   Final    NO GROWTH 3 DAYS Performed at [facility], [address] [place], [place] [address]    Report Status PENDING  Incomplete  Gastrointestinal Panel by PCR , Stool     Status: None  Collection Time: 12/15/22 12:26 PM   Specimen: STOOL  Result Value Ref Range Status   Campylobacter species NOT DETECTED NOT DETECTED Final   Plesimonas shigelloides NOT DETECTED NOT DETECTED Final   Salmonella species NOT DETECTED NOT DETECTED Final   Yersinia enterocolitica NOT DETECTED NOT DETECTED Final   Vibrio species NOT DETECTED NOT DETECTED Final   Vibrio cholerae NOT DETECTED NOT DETECTED Final   Enteroaggregative E coli (EAEC) NOT DETECTED NOT DETECTED Final   Enteropathogenic E coli (EPEC) NOT DETECTED NOT DETECTED Final   Enterotoxigenic E coli (ETEC) NOT DETECTED NOT DETECTED Final   Shiga like toxin producing E coli (STEC) NOT DETECTED NOT DETECTED Final   Shigella/Enteroinvasive E coli (EIEC) NOT DETECTED NOT DETECTED Final   Cryptosporidium NOT DETECTED NOT DETECTED Final   Cyclospora cayetanensis NOT DETECTED NOT DETECTED Final   Entamoeba histolytica NOT DETECTED NOT DETECTED Final   Giardia lamblia NOT DETECTED NOT DETECTED Final   Adenovirus F40/41 NOT DETECTED NOT DETECTED Final   Astrovirus NOT DETECTED NOT DETECTED Final   Norovirus GI/GII NOT DETECTED NOT DETECTED Final   Rotavirus A NOT DETECTED NOT DETECTED Final   Sapovirus (I, II, IV, and V) NOT DETECTED NOT DETECTED Final    Comment: Performed at [facility], [address] [place], [place] [address]  C Difficile Quick Screen w PCR reflex     Status: None   Collection Time: 12/15/22 12:26 PM   Specimen: STOOL  Result Value Ref Range Status   C Diff antigen NEGATIVE NEGATIVE Final   C Diff toxin NEGATIVE NEGATIVE Final   C  Diff interpretation No C. difficile detected.  Final    Comment: Performed at [facility], [address], [place], [place] [address]         Radiology Studies: [place] EKG SITE RITE  Result Date: 12/17/2022 If Site Rite image not attached, placement could not be confirmed due to current cardiac rhythm.  CT Angio Abd/Pel w/ and/or w/o  Result Date: 12/17/2022 CLINICAL DATA:  Chronic mesenteric ischemia EXAM: CTA ABDOMEN AND PELVIS WITHOUT AND WITH CONTRAST TECHNIQUE: Multidetector CT imaging of the abdomen and pelvis was performed using the standard protocol during bolus administration of intravenous contrast. Multiplanar reconstructed images and MIPs were obtained and reviewed to evaluate the vascular anatomy. RADIATION DOSE REDUCTION: This exam was performed according to the departmental dose-optimization program which includes automated exposure control, adjustment of the mA and/or kV according to patient size and/or use of iterative reconstruction technique. CONTRAST:  OMNIPAQUE IOHEXOL 350 MG/ML SOLN COMPARISON:  CT scan of the abdomen and pelvis 12/15/2022 FINDINGS: VASCULAR Aorta: Normal caliber aorta without aneurysm, dissection, vasculitis or significant stenosis. Celiac: Patent without evidence of aneurysm, dissection, vasculitis or significant stenosis. SMA: Patent without evidence of aneurysm, dissection, vasculitis or significant stenosis. Renals: Both renal arteries are patent without evidence of aneurysm, dissection, vasculitis, fibromuscular dysplasia or significant stenosis. IMA: Patent without evidence of aneurysm, dissection, vasculitis or significant stenosis. Inflow: Patent without evidence of aneurysm, dissection, vasculitis or significant stenosis. Proximal Outflow: Bilateral common femoral and visualized portions of the superficial and profunda femoral arteries are patent without evidence of aneurysm, dissection, vasculitis or significant stenosis. Veins: The portal and  mesenteric veins are widely patent. No focal venous abnormality. Review of the MIP images confirms the above findings. NON-VASCULAR Lower chest: Trace bilateral pleural effusions and associated atelectasis. Partially imaged cardiac rhythm maintenance device. The heart is normal in size. Hepatobiliary: The gallbladder is surgically absent.  Trace air is present within the common hepatic duct and the left intrahepatic ducts suggesting patency of the biliary sphincter. No discrete hepatic lesion. Pancreas: Unremarkable. No pancreatic ductal dilatation or surrounding inflammatory changes. Spleen: No splenic injury or perisplenic hematoma. Adrenals/Urinary Tract: The adrenal glands are normal. No hydronephrosis, nephrolithiasis or enhancing renal mass. The ureters and bladder are unremarkable. Stomach/Bowel: Progressive inflammatory changes in the right upper quadrant now with multiple locules of extraperitoneal air. Multiple small bowel diverticula are also present in the region. Correlation with prior imaging from November of 2023 confirms multifocal small bowel diverticula in this region. The imaging appearance is most consistent with perforated diverticulitis of the small bowel. Lymphatic: No suspicious lymphadenopathy on today's exam. Reproductive: Prostate is unremarkable. Other: Surgical changes of prior bilateral laparoscopic ventral hernia repair. No discrete abscess or free fluid. Musculoskeletal: Advanced L5-S1 degenerative disc disease. IMPRESSION: VASCULAR 1. No evidence of visceral artery stenosis or occlusion. There is excellent arterial supply to the affected bowel in the right upper quadrant. By imaging, ischemia is not a consideration. 2. No evidence of venous obstruction or thrombosis. 3. Mild scattered atherosclerotic plaque. Aortic Atherosclerosis (ICD10-I70.0). NON-VASCULAR 1. Interval progression of the inflammatory process affecting several loops of small bowel in the right upper quadrant now with  multiple locules of extraluminal gas consistent with contained perforation. Correlation with prior imaging demonstrates multiple small bowel diverticula in the same region. Overall, the imaging appearance is most consistent with perforated diverticulitis of the small bowel in the right upper quadrant. 2. No abscess formation or free fluid at this time. 3. Additional ancillary findings as above. Electronically Signed   By: Malachy Moan M.D.   On: 12/17/2022 06:20           LOS: 3 days   Time spent= 35 mins    Darshana Curnutt Joline Maxcy, MD [facility]  If 7PM-7AM, please contact night-coverage  12/18/2022, 9:03 AM

## 2022-12-18 NOTE — Progress Notes (Signed)
Initial Nutrition Assessment  DOCUMENTATION CODES:   Non-severe (moderate) malnutrition in context of chronic illness  INTERVENTION:  TPN initiation to meet nutrition needs Pt is at risk for refeeding syndrome; monitor magnesium, potassium, and phosphorus BID for at least 3 days following initiation of TPN, MD to replete as needed Recommend Thiamine 100mg  x5 days  NUTRITION DIAGNOSIS:   Moderate Malnutrition related to chronic illness (CLL, diverticulitis) as evidenced by moderate fat depletion, moderate muscle depletion, mild muscle depletion, energy intake < or equal to 75% for > or equal to 1 month.  GOAL:   Patient will meet greater than or equal to 90% of their needs   MONITOR:   Diet advancement, Labs, Weight trends, I & O's  REASON FOR ASSESSMENT:   Consult New TPN/TNA  ASSESSMENT:   Pt admitted with enteritis. PMH significant for CLL, paroxysmal afib on eliquis, colon cancer s/p L hemicolectomy (10+ years ago), GERD.   Work up with findings of perforated diverticulitis of small bowel, no abscess. Surgery plans for exlap with small bowel resection today.   Spoke with pt and wife at bedside who assisted in providing nutrition related history.   Pt states that his portion sizes have been small for at least the last 3 years d/t history of CLL, chemo and now perforated diverticulitis. He typically gets up later in the morning and has coffee and a muffin. He will have lunch around 2p and dinner around 7p. His intake consists of soup or eggs and egg sandwiches and sometimes crackers for a snack. During admission at [facility]SH 4/26-5/1 he was on a full liquid diet and was receiving Ensure at that time but he does not like these supplements. Discussed alternative nutrition supplements he can try once his diet is advanced.   Pt states that his energy levels have been very minimal. At home he recalls periods of fogginess and fatigue d/t limited PO intake. Denies episodes of n/v but  endorses having loose stools.   Noted pt was trialed on clear liquid diet 05/13 but did not tolerate well. Discussed TPN and addressed any questions and concerns pt may have.   Pt is uncertain of any weight fluctuation. His wife states that his pants have been tight d/t distension and bloating however he feels that his skin is more loose. Unfortunately, there is limited documentation of weight history on file. Pt's weight noted to be 90.8 kg 1 year ago (12/07/21). Weight on 07/30/22 was 94.8 kg. Current weight 90.9 kg. Will continue to monitor throughout admission.   Medications: IV abx  Labs reviewed  NUTRITION - FOCUSED PHYSICAL EXAM:  Flowsheet Row Most Recent Value  Orbital Region Moderate depletion  Upper Arm Region Moderate depletion  Thoracic and Lumbar Region No depletion  Buccal Region Mild depletion  Temple Region No depletion  Clavicle Bone Region Mild depletion  Clavicle and Acromion Bone Region No depletion  Scapular Bone Region No depletion  Dorsal Hand Mild depletion  Patellar Region Moderate depletion  Anterior Thigh Region Moderate depletion  Posterior Calf Region Mild depletion  Edema (RD Assessment) None  Hair Reviewed  Eyes Reviewed  Mouth Reviewed  Skin Reviewed  Nails Reviewed       Diet Order:   Diet Order             Diet NPO time specified Except for: Sips with Meds  Diet effective midnight                   EDUCATION NEEDS:  Education needs have been addressed  Skin:  Skin Assessment: Reviewed RN Assessment  Last BM:  5/14 (type 6 small)  Height:   Ht Readings from Last 1 Encounters:  12/14/22 6' (1.829 m)    Weight:   Wt Readings from Last 1 Encounters:  12/18/22 90.9 kg   BMI:  Body mass index is 27.18 kg/m.  Estimated Nutritional Needs:   Kcal:  2400-2600  Protein:  120-135g  Fluid:  >/=2L  Drusilla Kanner, RDN, LDN Clinical Nutrition

## 2022-12-18 NOTE — Transfer of Care (Signed)
Immediate Anesthesia Transfer of Care Note  Patient: Jonathon Parker [facility]  Procedure(s) Performed: EXPLORATORY LAPAROTOMY, BOWEL RESECTION (Abdomen)  Patient Location: PACU  Anesthesia Type:General  Level of Consciousness: awake  Airway & Oxygen Therapy: Patient Spontanous Breathing and Patient connected to nasal cannula oxygen  Post-op Assessment: Report given to RN and Post -op Vital signs reviewed and stable  Post vital signs: Reviewed and stable  Last Vitals:  Vitals Value Taken Time  BP 119/79 12/18/22 1330  Temp    Pulse 62 12/18/22 1331  Resp 22 12/18/22 1331  SpO2 97 % 12/18/22 1331  Vitals shown include unvalidated device data.  Last Pain:  Vitals:   12/18/22 1111  TempSrc: Oral  PainSc:       Patients Stated Pain Goal: 0 (12/15/22 2125)  Complications: No notable events documented.

## 2022-12-18 NOTE — Progress Notes (Signed)
PHARMACY - TOTAL PARENTERAL NUTRITION CONSULT NOTE  Indication: Probable small bowel diverticula perforation   Patient Measurements: Height: 6' (182.9 cm) Weight: 90.9 kg (200 lb 6.4 oz) IBW/kg (Calculated) : 77.6 TPN AdjBW (KG): 95.3 Body mass index is 27.18 kg/m.  Assessment:  41 YOM presented to [facility] on 5/11 with fevers, abdominal pain and confusion.  PMH significant for CLL, and colon cancer post L hemicolectomy.  Recent history of pacemaker placement for Afib/brady and enteritis.  CT showed small bowel diverticulitis vs enteritis vs SBO with ischemia.  Patient was on a CLD on 5/13 and began having increased abdominal pain.  Surgery plans to perform an ex-lap with SBR.  Patient reports eating two meals a day and intake was reduced to ~50% given his abdominal pain that started months ago.  Patient feels that he lost weight but is unsure of how much.  Glucose / Insulin: no hx DM - AM glucose WNL Electrolytes: all WNL (Na low normal and Phos/Mag high normal) Renal: SCr 1.06, BUN WNL Hepatic: LFTs WNL except alk phos at 145, tbili normalized, albumin 2.7 Intake / Output; MIVF: UOP 1.1 ml/kg/hr, LBM 5/14 GI Imaging: none since TPN initiation GI Surgeries / Procedures:  5/15 ex-lap with SBR pending  Central access: PICC placed 12/17/22 TPN start date: 12/18/22  Nutritional Goals: RD Estimated Needs Total Energy Estimated Needs: 2400-2600 Total Protein Estimated Needs: 120-135g Total Fluid Estimated Needs: >/=2L  Current Nutrition:  NPO  Plan:  Start TPN at 83mL/hr at 1800 (goal rate 95 ml/hr) Electrolytes in TPN: Na 85mEq/L, K 38mEq/L, Ca 33mEq/L, Mg 76mEq/L, Phos 23mmol/L, Cl:Ac 1:1 for now Add standard MVI and trace elements to TPN Initiate sensitive SSI Q6H Standard TPN labs and nursing care orders  Jonathon Parker, PharmD, BCPS, BCCCP 12/18/2022, 9:35 AM

## 2022-12-18 NOTE — Progress Notes (Signed)
Notified by RN that patient had questions for surgery before proceeding with signing consent.  Seen at bedside. Still having R sided abdominal pain. No flatus or bm. TTP on the R side of his abdomen.  He denies any questions. Wanted me to call his wife on speaker while in the room to ensure she did not have any questions. I was able to speak with his wife and answer all questions. Patient agreeable with proceeding with surgery.  Jacinto Halim, PA-C

## 2022-12-19 ENCOUNTER — Encounter (HOSPITAL_COMMUNITY): Payer: Self-pay | Admitting: Surgery

## 2022-12-19 ENCOUNTER — Inpatient Hospital Stay (HOSPITAL_COMMUNITY): Payer: Medicare PPO

## 2022-12-19 DIAGNOSIS — K529 Noninfective gastroenteritis and colitis, unspecified: Secondary | ICD-10-CM | POA: Diagnosis not present

## 2022-12-19 LAB — COMPREHENSIVE METABOLIC PANEL
ALT: 19 U/L (ref 0–44)
AST: 21 U/L (ref 15–41)
Albumin: 2.8 g/dL — ABNORMAL LOW (ref 3.5–5.0)
Alkaline Phosphatase: 115 U/L (ref 38–126)
Anion gap: 10 (ref 5–15)
BUN: 19 mg/dL (ref 8–23)
CO2: 24 mmol/L (ref 22–32)
Calcium: 8.7 mg/dL — ABNORMAL LOW (ref 8.9–10.3)
Chloride: 101 mmol/L (ref 98–111)
Creatinine, Ser: 1.01 mg/dL (ref 0.61–1.24)
GFR, Estimated: >60 mL/min (ref >60)
Glucose, Bld: 137 mg/dL — ABNORMAL HIGH (ref 70–99)
Potassium: 4.3 mmol/L (ref 3.5–5.1)
Sodium: 135 mmol/L (ref 135–145)
Total Bilirubin: 1 mg/dL (ref 0.3–1.2)
Total Protein: 6.1 g/dL — ABNORMAL LOW (ref 6.5–8.1)

## 2022-12-19 LAB — HEPARIN LEVEL (UNFRACTIONATED): Heparin Unfractionated: 0.46 IU/mL (ref 0.30–0.70)

## 2022-12-19 LAB — CBC
HCT: 29.6 % — ABNORMAL LOW (ref 39.0–52.0)
Hemoglobin: 9.9 g/dL — ABNORMAL LOW (ref 13.0–17.0)
MCH: 33.7 pg (ref 26.0–34.0)
MCHC: 33.4 g/dL (ref 30.0–36.0)
MCV: 100.7 fL — ABNORMAL HIGH (ref 80.0–100.0)
Platelets: 223 10*3/uL (ref 150–400)
RBC: 2.94 MIL/uL — ABNORMAL LOW (ref 4.22–5.81)
RDW: 12.5 % (ref 11.5–15.5)
WBC: 8.5 10*3/uL (ref 4.0–10.5)
nRBC: 0 % (ref 0.0–0.2)

## 2022-12-19 LAB — MAGNESIUM: Magnesium: 2.2 mg/dL (ref 1.7–2.4)

## 2022-12-19 LAB — PHOSPHORUS: Phosphorus: 2.5 mg/dL (ref 2.5–4.6)

## 2022-12-19 LAB — TRIGLYCERIDES: Triglycerides: 121 mg/dL (ref <150)

## 2022-12-19 LAB — GLUCOSE, CAPILLARY
Glucose-Capillary: 158 mg/dL — ABNORMAL HIGH (ref 70–99)
Glucose-Capillary: 123 mg/dL — ABNORMAL HIGH (ref 70–99)
Glucose-Capillary: 129 mg/dL — ABNORMAL HIGH (ref 70–99)

## 2022-12-19 LAB — APTT: aPTT: 48 seconds — ABNORMAL HIGH (ref 24–36)

## 2022-12-19 MED ORDER — HEPARIN (PORCINE) 25000 UT/250ML-% IV SOLN: 1300.0000 [IU]/h | INTRAVENOUS | Status: DC

## 2022-12-19 MED ORDER — METOPROLOL TARTRATE 5 MG/5ML IV SOLN
5.0000 mg | INTRAVENOUS | Status: DC | PRN
  Administered 2022-12-19 – 2022-12-29 (×9): 5 mg via INTRAVENOUS
  Filled 2022-12-19 (×12): qty 5

## 2022-12-19 MED ORDER — ACETAMINOPHEN 10 MG/ML IV SOLN
1000.0000 mg | Freq: Four times a day (QID) | INTRAVENOUS | Status: AC
  Administered 2022-12-19 – 2022-12-20 (×3): 1000 mg via INTRAVENOUS
  Filled 2022-12-19 (×4): qty 100

## 2022-12-19 MED ORDER — SODIUM PHOSPHATES 45 MMOLE/15ML IV SOLN
20.0000 mmol | Freq: Once | INTRAVENOUS | Status: AC
  Administered 2022-12-19: 20 mmol via INTRAVENOUS
  Filled 2022-12-19: qty 6.67

## 2022-12-19 MED ORDER — PIPERACILLIN-TAZOBACTAM 3.375 G IVPB
3.3750 g | Freq: Three times a day (TID) | INTRAVENOUS | Status: AC
  Administered 2022-12-19 – 2022-12-23 (×13): 3.375 g via INTRAVENOUS
  Filled 2022-12-19 (×14): qty 50

## 2022-12-19 MED ORDER — METHOCARBAMOL 1000 MG/10ML IJ SOLN
1000.0000 mg | Freq: Three times a day (TID) | INTRAVENOUS | Status: DC
  Administered 2022-12-19 – 2023-01-01 (×38): 1000 mg via INTRAVENOUS
  Filled 2022-12-19 (×3): qty 10
  Filled 2022-12-19 (×3): qty 1000
  Filled 2022-12-19: qty 10
  Filled 2022-12-19 (×11): qty 1000
  Filled 2022-12-19 (×3): qty 10
  Filled 2022-12-19 (×6): qty 1000
  Filled 2022-12-19: qty 10
  Filled 2022-12-19 (×6): qty 1000
  Filled 2022-12-19: qty 10
  Filled 2022-12-19: qty 1000
  Filled 2022-12-19: qty 10
  Filled 2022-12-19 (×2): qty 1000
  Filled 2022-12-19: qty 10
  Filled 2022-12-19 (×2): qty 1000

## 2022-12-19 MED ORDER — SODIUM CHLORIDE 0.9 % IV SOLN
INTRAVENOUS | Status: DC | PRN
  Administered 2022-12-19: 10 mL via INTRAVENOUS

## 2022-12-19 MED ORDER — TRAVASOL 10 % IV SOLN
INTRAVENOUS | Status: AC
  Filled 2022-12-19: qty 792

## 2022-12-19 MED ORDER — HEPARIN (PORCINE) 25000 UT/250ML-% IV SOLN
1450.0000 [IU]/h | INTRAVENOUS | Status: DC
  Administered 2022-12-19: 1300 [IU]/h via INTRAVENOUS
  Administered 2022-12-21 (×2): 1550 [IU]/h via INTRAVENOUS
  Administered 2022-12-23 – 2022-12-25 (×4): 1450 [IU]/h via INTRAVENOUS
  Filled 2022-12-19 (×11): qty 250

## 2022-12-19 NOTE — Progress Notes (Signed)
PROGRESS NOTE    Jonathon Parker [facility]  UJW:[id number] DOB: 1951/02/25 DOA: 12/14/2022 PCP: Paulina Fusi, MD   Brief Narrative:   72 y.o. male past medical history significant of CLL, Paroxysmal A. Fib. on Eliquis, history of colon cancer status post left hemicolectomy more than 10 years ago, GERD who was recently seen at an outside facility [facility]) about 10 days prior to admission and diagnosed with sepsis 2/2 enteritis for which he was discharged on oral antibiotics.  The patient was brought in by family with worsening abdominal pain and confusion.  Associated with watery stools.     Workup revealed findings most consistent with perforated diverticulitis of the small bowel in the right upper quadrant, no abscess formation or free fluid at this time.  The patient is currently on Zosyn.  General surgery following.  Possible exploratory surgery during this admission.  Stool study was negative for C. difficile.  Eliquis held, heparin drip started on 12/17/2022.  Patient underwent exploratory laparotomy, lysis of adhesion and small bowel resection with primary anastomosis by general surgery on 5/15.   Assessment & Plan:  Principal Problem:   Enteritis Active Problems:   Sepsis (HCC)   Depression   CLL (chronic lymphocytic leukemia) (HCC)   Malnutrition of moderate degree    Perforated diverticulitis of the small bowel, POA Status post exploratory laparotomy, lysis of adhesion and small bowel resection with primary anastomosis on 5/15 Routine postop management by general surgery.  On IV Zosyn. Advance diet as tolerated per general surgery. Currently getting TPN   Paroxysmal A-fib on Eliquis Anticoagulation to resume when cleared by general surgery   Resolved acute metabolic encephalopathy: CT was negative for any acute findings. Reorient as needed   Depression: Continue Wellbutrin   Dementia without behavioral disturbances: Continue home aricept, seroquel    CLL (chronic lymphocytic leukemia) (HCC) No acute issues related to CLL Follow-up with hematology oncology   Macrocytic anemia: Noted follow-up with PCP as an outpatient.   Hx CVA Aspirin, Lipitor  Outpatient recently diagnosed OSA - CPAP ordered but patient refusing  Intermittently p.o. medications will be held until his medical condition allows  DVT prophylaxis: Heparin drip Code Status: Full code Family Communication:  Spouse updated.  Status is: Inpatient Ongoing postop management      Diet Orders (From admission, onward)     Start     Ordered   12/18/22 1441  Diet NPO time specified Except for: Ice Chips  Diet effective midnight       Comments: 1 cup of ice chips per day  Question:  Except for  Answer:  Ice Chips   12/18/22 1440            Subjective: Still having significant abdominal pain which is managed by general surgery.  This is a surgical site.  Not passing gas yet.  Examination: Constitutional: Not in acute distress.  NG tube in place Respiratory: Clear to auscultation bilaterally Cardiovascular: Normal sinus rhythm, no rubs Abdomen: Nontender nondistended good bowel sounds Musculoskeletal: No edema noted Skin: Surgical site noted. Neurologic: CN 2-12 grossly intact.  And nonfocal Psychiatric: Normal judgment and insight. Alert and oriented x 3. Normal mood. Objective: Vitals:   12/18/22 1750 12/18/22 2022 12/18/22 2343 12/19/22 0431  BP:  128/78 126/84 (!) 176/90  Pulse:  86 82 72  Resp:  14 10 14   Temp: 97.9 F (36.6 C) 97.7 F (36.5 C) 97.8 F (36.6 C) 98.2 F (36.8 C)  TempSrc: Oral Oral Oral  Oral  SpO2:  97% 97% 98%  Weight:    90.5 kg  Height:        Intake/Output Summary (Last 24 hours) at 12/19/2022 0816 Last data filed at 12/19/2022 0434 Gross per 24 hour  Intake 1998.48 ml  Output 1415 ml  Net 583.48 ml   Filed Weights   12/18/22 0326 12/18/22 1111 12/19/22 0431  Weight: 90.9 kg 95.3 kg 90.5 kg    Scheduled Meds:   aspirin EC  81 mg Oral Daily   atorvastatin  20 mg Oral Daily   buPROPion  300 mg Oral Daily   Chlorhexidine Gluconate Cloth  6 each Topical Daily   colestipol  1 g Oral Daily   donepezil  5 mg Oral Daily   insulin aspart  0-9 Units Subcutaneous Q6H   melatonin  3 mg Oral QHS   sertraline  25 mg Oral Daily   sodium chloride flush  10-40 mL Intracatheter Q12H   thiamine (VITAMIN B1) injection  100 mg Intravenous Daily   traZODone  100 mg Oral QHS   Continuous Infusions:  sodium chloride 75 mL/hr at 12/19/22 0810   acetaminophen 1,000 mg (12/19/22 0118)   methocarbamol (ROBAXIN) IV 500 mg (12/19/22 0434)   piperacillin-tazobactam (ZOSYN)  IV 3.375 g (12/19/22 0029)   TPN ADULT (ION) 40 mL/hr at 12/19/22 0431    Nutritional status Signs/Symptoms: moderate fat depletion, moderate muscle depletion, mild muscle depletion, energy intake < or equal to 75% for > or equal to 1 month Interventions: TPN, Refer to RD note for recommendations Body mass index is 26.69 kg/m.  Data Reviewed:   CBC: Recent Labs  Lab 12/14/22 2223 12/15/22 0925 12/16/22 0047 12/17/22 0116 12/18/22 0330  WBC 11.0* 8.1 5.1 4.1 4.4  HGB 12.2* 10.4* 9.6* 9.7* 10.4*  HCT 36.7* 31.6* 29.6* 29.2* 30.7*  MCV 101.9* 103.6* 103.5* 100.7* 98.7  PLT 221 178 143* 154 188   Basic Metabolic Panel: Recent Labs  Lab 12/14/22 2223 12/15/22 0925 12/16/22 0047 12/17/22 0116 12/18/22 0330  NA 139  --  134* 135 136  K 4.4  --  3.9 3.9 3.9  CL 103  --  105 104 99  CO2 29  --  24 24 25   GLUCOSE 141*  --  99 99 84  BUN 12  --  9 7* 8  CREATININE 1.00 1.02 0.92 0.99 1.06  CALCIUM 8.8*  --  8.0* 8.5* 8.7*  MG  --   --   --   --  2.2  PHOS  --   --   --   --  4.3   GFR: Estimated Creatinine Clearance: 70.2 mL/min (by C-G formula based on SCr of 1.06 mg/dL). Liver Function Tests: Recent Labs  Lab 12/14/22 2223 12/16/22 0047 12/18/22 0330  AST 25 12* 14*  ALT 12 15 16   ALKPHOS 120 80 145*  BILITOT 0.7 1.3*  0.9  PROT 5.9* 4.8* 6.0*  ALBUMIN 3.7 2.5* 2.7*   Recent Labs  Lab 12/14/22 2223  LIPASE 24   No results for input(s): "AMMONIA" in the last 168 hours. Coagulation Profile: No results for input(s): "INR", "PROTIME" in the last 168 hours. Cardiac Enzymes: No results for input(s): "CKTOTAL", "CKMB", "CKMBINDEX", "TROPONINI" in the last 168 hours. BNP (last 3 results) No results for input(s): "PROBNP" in the last 8760 hours. HbA1C: No results for input(s): "HGBA1C" in the last 72 hours. CBG: Recent Labs  Lab 12/18/22 1006 12/18/22 1437 12/18/22 1733 12/18/22 2342 12/19/22 0544  GLUCAP 87 127* 157* 174* 158*   Lipid Profile: No results for input(s): "CHOL", "HDL", "LDLCALC", "TRIG", "CHOLHDL", "LDLDIRECT" in the last 72 hours. Thyroid Function Tests: No results for input(s): "TSH", "T4TOTAL", "FREET4", "T3FREE", "THYROIDAB" in the last 72 hours. Anemia Panel: No results for input(s): "VITAMINB12", "FOLATE", "FERRITIN", "TIBC", "IRON", "RETICCTPCT" in the last 72 hours. Sepsis Labs: Recent Labs  Lab 12/14/22 2223  LATICACIDVEN 1.7    Recent Results (from the past 240 hour(s))  Culture, blood (routine x 2)     Status: None (Preliminary result)   Collection Time: 12/14/22 10:24 PM   Specimen: BLOOD RIGHT ARM  Result Value Ref Range Status   Specimen Description   Final    BLOOD RIGHT ARM Performed at [facility], [address], [place], [place] [address]    Special Requests   Final    BOTTLES DRAWN AEROBIC AND ANAEROBIC Blood Culture adequate volume Performed at [facility], [address] [place], [place] [address]    Culture   Final    NO GROWTH 4 DAYS Performed at [facility], [address], [place], [place] [address]    Report Status PENDING  Incomplete  Gastrointestinal Panel by PCR , Stool     Status: None   Collection Time: 12/15/22 12:26 PM   Specimen: STOOL  Result Value Ref Range Status   Campylobacter species NOT  DETECTED NOT DETECTED Final   Plesimonas shigelloides NOT DETECTED NOT DETECTED Final   Salmonella species NOT DETECTED NOT DETECTED Final   Yersinia enterocolitica NOT DETECTED NOT DETECTED Final   Vibrio species NOT DETECTED NOT DETECTED Final   Vibrio cholerae NOT DETECTED NOT DETECTED Final   Enteroaggregative E coli (EAEC) NOT DETECTED NOT DETECTED Final   Enteropathogenic E coli (EPEC) NOT DETECTED NOT DETECTED Final   Enterotoxigenic E coli (ETEC) NOT DETECTED NOT DETECTED Final   Shiga like toxin producing E coli (STEC) NOT DETECTED NOT DETECTED Final   Shigella/Enteroinvasive E coli (EIEC) NOT DETECTED NOT DETECTED Final   Cryptosporidium NOT DETECTED NOT DETECTED Final   Cyclospora cayetanensis NOT DETECTED NOT DETECTED Final   Entamoeba histolytica NOT DETECTED NOT DETECTED Final   Giardia lamblia NOT DETECTED NOT DETECTED Final   Adenovirus F40/41 NOT DETECTED NOT DETECTED Final   Astrovirus NOT DETECTED NOT DETECTED Final   Norovirus GI/GII NOT DETECTED NOT DETECTED Final   Rotavirus A NOT DETECTED NOT DETECTED Final   Sapovirus (I, II, IV, and V) NOT DETECTED NOT DETECTED Final    Comment: Performed at [facility], [address] [place], [place] [address]  C Difficile Quick Screen w PCR reflex     Status: None   Collection Time: 12/15/22 12:26 PM   Specimen: STOOL  Result Value Ref Range Status   C Diff antigen NEGATIVE NEGATIVE Final   C Diff toxin NEGATIVE NEGATIVE Final   C Diff interpretation No C. difficile detected.  Final    Comment: Performed at [facility], [address] [place], [place] [address]  Surgical pcr screen     Status: None   Collection Time: 12/18/22  9:02 AM   Specimen: Nasal Mucosa; Nasal Swab  Result Value Ref Range Status   MRSA, PCR NEGATIVE NEGATIVE Final   Staphylococcus aureus NEGATIVE NEGATIVE Final    Comment: (NOTE) The Xpert SA Assay (FDA approved for NASAL specimens in patients 101 years of age and older), is  one component of a comprehensive surveillance program. It  is not intended to diagnose infection nor to guide or monitor treatment. Performed at [facility], [address] [place], [place] [address]          Radiology Studies: DG Abd 1 View  Result Date: 12/18/2022 CLINICAL DATA:  Encounter for imaging study to confirm nasogastric tube placement. EXAM: ABDOMEN - 1 VIEW COMPARISON:  CT of the abdomen and pelvis 5/12/4 FINDINGS: The side port of the NG tube is in the distal esophagus at least 5 cm above the GE junction. Recommend advancing at least 10 cm for more optimal positioning. Bowel gas pattern is unremarkable. The lung bases are clear. Skin staples are in place. IMPRESSION: NG tube side port is in the distal esophagus at least 5 cm above the GE junction. Recommend advancing at least 10 cm for more optimal positioning. Electronically Signed   By: Marin Roberts M.D.   On: 12/18/2022 14:05   [place] EKG SITE RITE  Result Date: 12/17/2022 If Site Rite image not attached, placement could not be confirmed due to current cardiac rhythm.          LOS: 4 days   Time spent= 35 mins    Dannon Perlow Joline Maxcy, MD [facility]  If 7PM-7AM, please contact night-coverage  12/19/2022, 8:16 AM

## 2022-12-19 NOTE — Progress Notes (Signed)
Primary RN alerted CN that patient HR is sustaining 130s-140s when patient was transferred from bed to chair. CN paged and notify Amin MD. MD ordered 5 mg IV metoprolol to be given. CN adminstered prn dose, PT and OT assisted patient back to bed. Primary RN and CN obtained EKG.

## 2022-12-19 NOTE — Progress Notes (Signed)
Patient refused cpap at this time. 

## 2022-12-19 NOTE — Progress Notes (Signed)
   12/19/22 1521  Assess: MEWS Score  Temp 97.7 F (36.5 C)  BP (!) 161/111  MAP (mmHg) 128  Pulse Rate (!) 144 (Patient moved to chair)  ECG Heart Rate (!) 145  Resp 14  Level of Consciousness Alert  SpO2 95 %  O2 Device Nasal Cannula  O2 Flow Rate (L/min) 3 L/min  Assess: MEWS Score  MEWS Temp 0  MEWS Systolic 0  MEWS Pulse 3  MEWS RR 0  MEWS LOC 0  MEWS Score 3  MEWS Score Color Yellow  Assess: if the MEWS score is Yellow or Red  Were vital signs taken at a resting state? Yes  Focused Assessment Change from prior assessment (see assessment flowsheet)  Does the patient meet 2 or more of the SIRS criteria? No  MEWS guidelines implemented  Yes, yellow  Treat  MEWS Interventions Considered administering scheduled or prn medications/treatments as ordered  Take Vital Signs  Increase Vital Sign Frequency  Yellow: Q2hr x1, continue Q4hrs until patient remains green for 12hrs  Escalate  MEWS: Escalate Yellow: Discuss with charge nurse and consider notifying provider and/or RRT  Notify: Charge Nurse/RN  Name of Charge Nurse/RN Notified Jeral Fruit, RN  Provider Notification  Provider Name/Title Dr. Nelson Chimes  Date Provider Notified 12/19/22  Time Provider Notified 1525  Method of Notification Page  Notification Reason Change in status  Provider response See new orders  Date of Provider Response 12/19/22  Time of Provider Response 1540  Notify: Rapid Response  Name of Rapid Response RN Notified Mitzi Davenport, RN  Date Rapid Response Notified 12/19/22  Time Rapid Response Notified 1540  Assess: SIRS CRITERIA  SIRS Temperature  0  SIRS Pulse 1  SIRS Respirations  0  SIRS WBC 0  SIRS Score Sum  1   Patient converted to A-Fib after moving from bed to chair. Notified Dr. Nelson Chimes and order for Metoprolol received. Lafonda Mosses, RN notified rapid response RN. EKG completed.

## 2022-12-19 NOTE — Progress Notes (Signed)
ANTICOAGULATION CONSULT NOTE  Pharmacy Consult for Heparin (Eliquis on hold) Indication: atrial fibrillation  Allergies  Allergen Reactions   Lisinopril Cough   Zolpidem Other (See Comments)    "Got me in outer space"     Patient Measurements: Height: 6' 0.5" (184.2 cm) Weight: 90.5 kg (199 lb 8.3 oz) IBW/kg (Calculated) : 78.75 Heparin Dosing Weight: 90 kg  Vital Signs: Temp: 98.3 F (36.8 C) (05/16 1114) Temp Source: Oral (05/16 1114) BP: 189/84 (05/16 1114) Pulse Rate: 74 (05/16 1114)  Labs: Recent Labs    12/17/22 0116 12/17/22 2027 12/18/22 0330 12/19/22 0900  HGB 9.7*  --  10.4* 9.9*  HCT 29.2*  --  30.7* 29.6*  PLT 154  --  188 223  APTT  --  43*  --   --   CREATININE 0.99  --  1.06 1.01     Estimated Creatinine Clearance: 73.7 mL/min (by C-G formula based on SCr of 1.01 mg/dL).   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 Valentino Hue in [facility] hospital     Depression    GERD (gastroesophageal reflux disease)    controlled with nexium    Hypertension    Pacemaker    Assessment: 72 yr old male on Eliquis 5 mg BID prior to admission for atrial fibrillation.  Eliquis stopped after 8am dose on 5/13 for possible surgery. Discussed with surgery: okay to resume heparin post-op without bolus.  Will use aPTTs for heparin monitoring while heparin levels are expected to be falsely elevated due to recent Eliquis doses.   aPTT previously subtherapeutic at 43 on heparin 1200 units/hr. CBC stable post-surgery  Goal of Therapy:  Heparin level 0.3-0.7 units/ml aPTT 66-102 seconds Monitor platelets by anticoagulation protocol: Yes   Plan:  Start heparin 1300 units/hr Check aPTT/heparin level at 2100 Daily heparin level/aPTT and CBC Monitor for signs and symptoms of bleeding  Eldridge Scot, PharmD Clinical  Pharmacist 12/19/2022,12:27 PM

## 2022-12-19 NOTE — Progress Notes (Signed)
Dr. Nelson Chimes and Crane, PA state to hold all PO meds today.

## 2022-12-19 NOTE — Progress Notes (Signed)
ANTICOAGULATION CONSULT NOTE  Pharmacy Consult for Heparin (Eliquis on hold) Indication: atrial fibrillation  Allergies  Allergen Reactions   Lisinopril Cough   Zolpidem Other (See Comments)    "Got me in outer space"     Patient Measurements: Height: 6' 0.5" (184.2 cm) Weight: 90.5 kg (199 lb 8.3 oz) IBW/kg (Calculated) : 78.75 Heparin Dosing Weight: 90 kg  Vital Signs: Temp: 98.2 F (36.8 C) (05/16 1954) Temp Source: Axillary (05/16 1954) BP: 150/88 (05/16 1954) Pulse Rate: 93 (05/16 1954)  Labs: Recent Labs    12/17/22 0116 12/17/22 2027 12/18/22 0330 12/19/22 0900 12/19/22 2130  HGB 9.7*  --  10.4* 9.9*  --   HCT 29.2*  --  30.7* 29.6*  --   PLT 154  --  188 223  --   APTT  --  43*  --   --  48*  HEPARINUNFRC  --   --   --   --  0.46  CREATININE 0.99  --  1.06 1.01  --      Estimated Creatinine Clearance: 73.7 mL/min (by C-G formula based on SCr of 1.01 mg/dL).   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 Valentino Hue in [facility] hospital     Depression    GERD (gastroesophageal reflux disease)    controlled with nexium    Hypertension    Pacemaker    Assessment: 72 yr old male on Eliquis 5 mg BID prior to admission for atrial fibrillation.  Eliquis stopped after 8am dose on 5/13 for possible surgery. Discussed with surgery: okay to resume heparin post-op without bolus.  Will use aPTTs for heparin monitoring while heparin levels are expected to be falsely elevated due to recent Eliquis doses.   aPTT 48 / HL 0.46 (not yet clinically correlated)  Goal of Therapy:  Heparin level 0.3-0.7 units/ml aPTT 66-102 seconds Monitor platelets by anticoagulation protocol: Yes   Plan:  Increase heparin to 1400 units/hr Check aPTT/heparin level with AM labs Daily heparin level/aPTT and CBC Monitor for signs  and symptoms of bleeding -f/u transition back to enteral anticoag as able  Calton Dach, PharmD Clinical Pharmacist 12/19/2022 10:39 PM

## 2022-12-19 NOTE — Progress Notes (Signed)
1 Day Post-Op  Subjective: CC:  Sore.  Has occasional tremors at baseline.  Currently with some left UE tremor.   NGT w/ 140cc/24 hours. Xray done yesterday w/ side port of NGT above GE junction. Will advance NG into stomach.  No BM or flatus. Foley w/ good uop. Afebrile. No tachycardia or hypotension. Labs pending.   Objective: Vital signs in last 24 hours: Temp:  [97.6 F (36.4 C)-98.2 F (36.8 C)] 98.2 F (36.8 C) (05/16 0431) Pulse Rate:  [60-86] 72 (05/16 0431) Resp:  [8-18] 14 (05/16 0431) BP: (117-176)/(76-96) 176/90 (05/16 0431) SpO2:  [93 %-98 %] 98 % (05/16 0431) Weight:  [90.5 kg-95.3 kg] 90.5 kg (05/16 0431) Last BM Date : 12/18/22  Intake/Output from previous day: 05/15 0701 - 05/16 0700 In: 1998.5 [P.O.:30; I.V.:1583.9; IV Piggyback:384.6] Out: 1415 [Urine:1075; Emesis/NG output:140; Blood:200] Intake/Output this shift: No intake/output data recorded.  PE: Gen:  Alert, NAD, pleasant Abd: Soft, quiet. Distended.  Incisional tenderness Dressing shows no bleeding, or signs of infection GU: Foley w/ straw colored urine.  Ext:  No LE edema or calf tenderness Psych: A&Ox3   Lab Results:  Recent Labs    12/17/22 0116 12/18/22 0330  WBC 4.1 4.4  HGB 9.7* 10.4*  HCT 29.2* 30.7*  PLT 154 188   BMET Recent Labs    12/17/22 0116 12/18/22 0330  NA 135 136  K 3.9 3.9  CL 104 99  CO2 24 25  GLUCOSE 99 84  BUN 7* 8  CREATININE 0.99 1.06  CALCIUM 8.5* 8.7*   PT/INR No results for input(s): "LABPROT", "INR" in the last 72 hours. CMP     Component Value Date/Time   NA 136 12/18/2022 0330   NA 138 05/23/2021 0000   K 3.9 12/18/2022 0330   CL 99 12/18/2022 0330   CO2 25 12/18/2022 0330   GLUCOSE 84 12/18/2022 0330   BUN 8 12/18/2022 0330   BUN 16 05/23/2021 0000   CREATININE 1.06 12/18/2022 0330   CALCIUM 8.7 (L) 12/18/2022 0330   PROT 6.0 (L) 12/18/2022 0330   ALBUMIN 2.7 (L) 12/18/2022 0330   AST 14 (L) 12/18/2022 0330   ALT 16 12/18/2022  0330   ALKPHOS 145 (H) 12/18/2022 0330   BILITOT 0.9 12/18/2022 0330   GFRNONAA >60 12/18/2022 0330   GFRAA >60 05/13/2019 0312   Lipase     Component Value Date/Time   LIPASE 24 12/14/2022 2223    Studies/Results: DG Abd 1 View  Result Date: 12/18/2022 CLINICAL DATA:  Encounter for imaging study to confirm nasogastric tube placement. EXAM: ABDOMEN - 1 VIEW COMPARISON:  CT of the abdomen and pelvis 5/12/4 FINDINGS: The side port of the NG tube is in the distal esophagus at least 5 cm above the GE junction. Recommend advancing at least 10 cm for more optimal positioning. Bowel gas pattern is unremarkable. The lung bases are clear. Skin staples are in place. IMPRESSION: NG tube side port is in the distal esophagus at least 5 cm above the GE junction. Recommend advancing at least 10 cm for more optimal positioning. Electronically Signed   By: Marin Roberts M.D.   On: 12/18/2022 14:05   [place] EKG SITE RITE  Result Date: 12/17/2022 If Site Rite image not attached, placement could not be confirmed due to current cardiac rhythm.   Anti-infectives: Anti-infectives (From admission, onward)    Start     Dose/Rate Route Frequency Ordered Stop   12/16/22 1100  piperacillin-tazobactam (ZOSYN) IVPB  3.375 g        3.375 g 12.5 mL/hr over 240 Minutes Intravenous Every 8 hours 12/16/22 0954     12/16/22 0400  ampicillin-sulbactam (UNASYN) 1.5 g in sodium chloride 0.9 % 100 mL IVPB  Status:  Discontinued        1.5 g 200 mL/hr over 30 Minutes Intravenous Every 6 hours 12/16/22 0258 12/16/22 0855   12/15/22 1630  piperacillin-tazobactam (ZOSYN) IVPB 3.375 g  Status:  Discontinued        3.375 g 12.5 mL/hr over 240 Minutes Intravenous Every 8 hours 12/15/22 1528 12/16/22 0307   12/15/22 1000  azithromycin (ZITHROMAX) tablet 500 mg  Status:  Discontinued        500 mg Oral Daily 12/15/22 0900 12/16/22 0258   12/15/22 0045  piperacillin-tazobactam (ZOSYN) IVPB 3.375 g        3.375 g 12.5 mL/hr  over 240 Minutes Intravenous  Once 12/15/22 0044 12/15/22 0446        Assessment/Plan POD 1 s/p Exploratory laparotomy, lysis of adhesions, small bowel resection with primary anastomosis for Contained perforation of jejunal diverticulum by Dr. Corliss Skains on 12/18/22 - Path pending - Cont NGT, AROBF. Advance NG into stomach - TPN - IV Tylenol and Robaxin while NGT in place. PRN dilaudid.  - Cont abx, plan 5d post op - Await AM CBC. There was a noticeable amount of oozing secondary to anticoagulation throughout the case. Would like to ensure hgb stable before resuming heparin.   - Mobilize - PT/OT - Pulm toilet, IS, wean o2  FEN - NPO, NGT to LIWS, PICC/TPN VTE - SCDs, await AM CBC - there was a noticeable amount of oozing throughout the case. Would like to ensure hgb stable before resuming heparin.   ID - Zosyn Foley - D/c today.   CLL (last chemo in November but on venetoclax)  Recent resp issues - bronchitis, pneumonia, RSV  A. Fib/Bradycardia - pacemaker placement about a month ago     LOS: 4 days    Jacinto Halim , [facility] Surgery 12/19/2022, 8:20 AM Please see Amion for pager number during day hours 7:00am-4:30pm  Wilmon Arms. Corliss Skains, MD, [facility] Surgery  General Surgery   12/19/2022 8:51 AM

## 2022-12-19 NOTE — Progress Notes (Signed)
PHARMACY - TOTAL PARENTERAL NUTRITION CONSULT NOTE  Indication: Probable small bowel diverticula perforation   Patient Measurements: Height: 6' 0.5" (184.2 cm) Weight: 90.5 kg (199 lb 8.3 oz) IBW/kg (Calculated) : 78.75 TPN AdjBW (KG): 95.3 Body mass index is 26.69 kg/m.  Assessment:  73 YOM presented to [facility] on 5/11 with fevers, abdominal pain and confusion.  PMH significant for CLL, and colon cancer post L hemicolectomy.  Recent history of pacemaker placement for Afib/brady and enteritis.  CT showed small bowel diverticulitis vs enteritis vs SBO with ischemia.  Patient was on a CLD on 5/13 and began having increased abdominal pain.  Surgery plans to perform an ex-lap with SBR.  Patient reports eating two meals a day and intake was reduced to ~50% given his abdominal pain that started months ago.  Patient feels that he lost weight but is unsure of how much.  Glucose / Insulin: no hx DM - CBGs < 180 but high normal, ?stress-related Used 6 units since TPN started Electrolytes: mild refeeding - all WNL (Phos 4.3 >> 2.5, Na low normal) Renal: SCr 1.01, BUN WNL Hepatic: LFTs WNL, tbili normalized, albumin 2.8 Intake / Output; MIVF: UOP 0.5 ml/kg/hr, LBM 5/15, NG , NS 75 ml/hr GI Imaging: none since TPN initiation GI Surgeries / Procedures:  5/15 ex-lap with LoA, SBR with primary anastomosis  Central access: PICC placed 12/17/22 TPN start date: 12/18/22  Nutritional Goals: RD Estimated Needs Total Energy Estimated Needs: 2400-2600 Total Protein Estimated Needs: 120-135g Total Fluid Estimated Needs: >/=2L  Current Nutrition:  TPN  Plan:  Increase TPN to 60 mL/hr at 1800 (goal rate 95 ml/hr) TPN provides 79g AA, 223g CHO, 46g ILE and 1536 kCal, meeting ~60% of needs Electrolytes in TPN: Na 37mEq/L, K 43mEq/L, Ca 89mEq/L, Mg 5mEq/L, Phos 59mmol/L, Cl:Ac 1:1 Add standard MVI and trace elements to TPN Continue moderate SSI Q4H Thiamine 100mg  IV daily x 5 days (first dose not  charted as given) NS at 75 ml/hr per MD NaPhos IV Standard TPN labs on Mon and Thurs - labs in AM  Easton D. Laney Potash, PharmD, BCPS, BCCCP 12/19/2022, 10:30 AM

## 2022-12-20 ENCOUNTER — Inpatient Hospital Stay (HOSPITAL_COMMUNITY): Payer: Medicare PPO

## 2022-12-20 DIAGNOSIS — K529 Noninfective gastroenteritis and colitis, unspecified: Secondary | ICD-10-CM | POA: Diagnosis not present

## 2022-12-20 LAB — BASIC METABOLIC PANEL
Anion gap: 11 (ref 5–15)
BUN: 14 mg/dL (ref 8–23)
CO2: 27 mmol/L (ref 22–32)
Calcium: 8.5 mg/dL — ABNORMAL LOW (ref 8.9–10.3)
Chloride: 100 mmol/L (ref 98–111)
Creatinine, Ser: 0.8 mg/dL (ref 0.61–1.24)
GFR, Estimated: >60 mL/min (ref >60)
Glucose, Bld: 107 mg/dL — ABNORMAL HIGH (ref 70–99)
Potassium: 3.8 mmol/L (ref 3.5–5.1)
Sodium: 138 mmol/L (ref 135–145)

## 2022-12-20 LAB — GLUCOSE, CAPILLARY
Glucose-Capillary: 106 mg/dL — ABNORMAL HIGH (ref 70–99)
Glucose-Capillary: 116 mg/dL — ABNORMAL HIGH (ref 70–99)
Glucose-Capillary: 137 mg/dL — ABNORMAL HIGH (ref 70–99)

## 2022-12-20 LAB — PROCALCITONIN: Procalcitonin: 0.2 ng/mL

## 2022-12-20 LAB — SURGICAL PATHOLOGY

## 2022-12-20 LAB — CBC
HCT: 29.6 % — ABNORMAL LOW (ref 39.0–52.0)
Hemoglobin: 9.6 g/dL — ABNORMAL LOW (ref 13.0–17.0)
MCH: 33.2 pg (ref 26.0–34.0)
MCHC: 32.4 g/dL (ref 30.0–36.0)
MCV: 102.4 fL — ABNORMAL HIGH (ref 80.0–100.0)
Platelets: 209 10*3/uL (ref 150–400)
RBC: 2.89 MIL/uL — ABNORMAL LOW (ref 4.22–5.81)
RDW: 12.6 % (ref 11.5–15.5)
WBC: 6.4 10*3/uL (ref 4.0–10.5)
nRBC: 0 % (ref 0.0–0.2)

## 2022-12-20 LAB — CULTURE, BLOOD (ROUTINE X 2)
Culture: NO GROWTH
Special Requests: ADEQUATE

## 2022-12-20 LAB — APTT
aPTT: 61 seconds — ABNORMAL HIGH (ref 24–36)
aPTT: 80 seconds — ABNORMAL HIGH (ref 24–36)

## 2022-12-20 LAB — MAGNESIUM: Magnesium: 2.1 mg/dL (ref 1.7–2.4)

## 2022-12-20 LAB — HEPARIN LEVEL (UNFRACTIONATED): Heparin Unfractionated: 0.52 IU/mL (ref 0.30–0.70)

## 2022-12-20 LAB — PHOSPHORUS: Phosphorus: 2.4 mg/dL — ABNORMAL LOW (ref 2.5–4.6)

## 2022-12-20 MED ORDER — LIDOCAINE HCL URETHRAL/MUCOSAL 2 % EX GEL
1.0000 | Freq: Once | CUTANEOUS | Status: AC
  Administered 2022-12-20: 1 via TOPICAL
  Filled 2022-12-20: qty 6

## 2022-12-20 MED ORDER — HYDROMORPHONE HCL 1 MG/ML IJ SOLN
0.5000 mg | INTRAMUSCULAR | Status: DC | PRN
  Administered 2022-12-20 – 2022-12-27 (×31): 1 mg via INTRAVENOUS
  Filled 2022-12-20 (×33): qty 1

## 2022-12-20 MED ORDER — POTASSIUM CHLORIDE 10 MEQ/50ML IV SOLN
10.0000 meq | INTRAVENOUS | Status: AC
  Administered 2022-12-20 (×4): 10 meq via INTRAVENOUS
  Filled 2022-12-20 (×4): qty 50

## 2022-12-20 MED ORDER — ACETAMINOPHEN 10 MG/ML IV SOLN
1000.0000 mg | Freq: Four times a day (QID) | INTRAVENOUS | Status: AC
  Administered 2022-12-20 – 2022-12-21 (×4): 1000 mg via INTRAVENOUS
  Filled 2022-12-20 (×4): qty 100

## 2022-12-20 MED ORDER — FUROSEMIDE 10 MG/ML IJ SOLN
40.0000 mg | Freq: Once | INTRAMUSCULAR | Status: DC
  Filled 2022-12-20: qty 4

## 2022-12-20 MED ORDER — SODIUM PHOSPHATES 45 MMOLE/15ML IV SOLN
20.0000 mmol | Freq: Once | INTRAVENOUS | Status: AC
  Administered 2022-12-20: 20 mmol via INTRAVENOUS
  Filled 2022-12-20: qty 6.67

## 2022-12-20 MED ORDER — SCOPOLAMINE 1 MG/3DAYS TD PT72
1.0000 | MEDICATED_PATCH | TRANSDERMAL | Status: DC
  Administered 2022-12-20 – 2022-12-29 (×4): 1.5 mg via TRANSDERMAL
  Filled 2022-12-20 (×4): qty 1

## 2022-12-20 MED ORDER — TRAVASOL 10 % IV SOLN
INTRAVENOUS | Status: AC
  Filled 2022-12-20: qty 990

## 2022-12-20 NOTE — Progress Notes (Signed)
Pt refusing CPAP due to tube in his nose. Will continue to ask.

## 2022-12-20 NOTE — Progress Notes (Signed)
ANTICOAGULATION CONSULT NOTE  Pharmacy Consult for Heparin (Eliquis on hold) Indication: atrial fibrillation  Allergies  Allergen Reactions   Lisinopril Cough   Zolpidem Other (See Comments)    "Got me in outer space"     Patient Measurements: Height: 6' 0.5" (184.2 cm) Weight: 91.4 kg (201 lb 8 oz) IBW/kg (Calculated) : 78.75 Heparin Dosing Weight: 90 kg  Vital Signs: Temp: 98.5 F (36.9 C) (05/17 0530) Temp Source: Axillary (05/17 0530) BP: 164/96 (05/17 0530) Pulse Rate: 110 (05/17 0530)  Labs: Recent Labs    12/17/22 2027 12/18/22 0330 12/18/22 0330 12/19/22 0900 12/19/22 2130 12/20/22 0510  HGB  --  10.4*   < > 9.9*  --  9.6*  HCT  --  30.7*  --  29.6*  --  29.6*  PLT  --  188  --  223  --  209  APTT 43*  --   --   --  48* 61*  HEPARINUNFRC  --   --   --   --  0.46 0.52  CREATININE  --  1.06  --  1.01  --  0.80   < > = values in this interval not displayed.     Estimated Creatinine Clearance: 93 mL/min (by C-G formula based on SCr of 0.8 mg/dL).   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 Valentino Hue in [facility] hospital     Depression    GERD (gastroesophageal reflux disease)    controlled with nexium    Hypertension    Pacemaker    Assessment: 72 yr old male on Eliquis 5 mg BID prior to admission for atrial fibrillation.  Eliquis stopped after 8am dose on 5/13 for possible surgery. Discussed with surgery: okay to resume heparin post-op without bolus.  Will use aPTTs for heparin monitoring while heparin levels are expected to be falsely elevated due to recent Eliquis doses.   aPTT 61 (remains subtherapeutic), heparin level 0.52 (not clinically correlating yet). No issues with line or bleeding reported per RN.  Goal of Therapy:  Heparin level 0.3-0.7 units/ml aPTT 66-102 seconds Monitor  platelets by anticoagulation protocol: Yes   Plan:  Increase heparin to 1550 units/hr F/u 8h aPTT -f/u transition back to enteral anticoag as able  Christoper Fabian, PharmD, BCPS Please see amion for complete clinical pharmacist phone list 12/20/2022 6:08 AM

## 2022-12-20 NOTE — Progress Notes (Signed)
Physical Therapy Evaluation Patient Details Name: Jonathon Parker [facility] MRN: [id number] DOB: Sep 16, 1950 Today's Date: 12/20/2022  History of Present Illness  71 y.o. male presented to the ED with with worsening abdominal pain and confusion. Past Medical History of HTN, R thalamic CVA, GERD, Depression, enteritis, sepsis, mild cognitive impairment. He is now s/p small bowel resection with primary anastomosis.  Clinical Impression  Pt presents with admitting diagnosis above. Co treat with OT. Session today very limited due to pt going into uncontrolled afib with movement and NG tube becoming dislodged upon sitting up. Pt HR up to 171 and RN was called into room. Pt required +2 Max A for all mobility today. Recommend SNF at DC to improve functional independence. PT will continue to follow.     Recommendations for follow up therapy are one component of a multi-disciplinary discharge planning process, led by the attending physician.  Recommendations may be updated based on patient status, additional functional criteria and insurance authorization.  Follow Up Recommendations Can patient physically be transported by private vehicle: No     Assistance Recommended at Discharge Frequent or constant Supervision/Assistance  Patient can return home with the following  Two people to help with walking and/or transfers;A lot of help with bathing/dressing/bathroom;Assistance with cooking/housework;Assistance with feeding;Direct supervision/assist for medications management;Direct supervision/assist for financial management;Assist for transportation;Help with stairs or ramp for entrance    Equipment Recommendations Other (comment) (Per accepting facility)  Recommendations for Other Services       Functional Status Assessment Patient has had a recent decline in their functional status and demonstrates the ability to make significant improvements in function in a reasonable and predictable amount of time.      Precautions / Restrictions Precautions Precautions: Fall Precaution Comments: abdominal binder Restrictions Weight Bearing Restrictions: No      Mobility  Bed Mobility Overal bed mobility: Needs Assistance Bed Mobility: Supine to Sit, Sit to Supine     Supine to sit: Max assist Sit to supine: Max assist, +2 for physical assistance        Transfers                   General transfer comment: deferred    Ambulation/Gait                  Stairs            Wheelchair Mobility    Modified Rankin (Stroke Patients Only)       Balance Overall balance assessment: Needs assistance Sitting-balance support: Feet supported Sitting balance-Leahy Scale: Fair Sitting balance - Comments: Pt not able to tolerate sitting balance for long due to abdominal pain       Standing balance comment: deferred                             Pertinent Vitals/Pain Pain Assessment Pain Assessment: 0-10 Pain Score: 8  Pain Location: belly Pain Descriptors / Indicators: Aching, Discomfort, Guarding Pain Intervention(s): Limited activity within patient's tolerance, Monitored during session, Premedicated before session    Home Living Family/patient expects to be discharged to:: Private residence Living Arrangements: Spouse/significant other Available Help at Discharge: Family;Available 24 hours/day Type of Home: House Home Access: Stairs to enter Entrance Stairs-Rails: Right;Can reach both;Left Entrance Stairs-Number of Steps: 7 back entrance Alternate Level Stairs-Number of Steps: 1 flight Home Layout: Two level;Able to live on main level with bedroom/bathroom Home Equipment: Rolling Walker (2 wheels);Cane - single point;Wheelchair -  manual;Cane - quad;Shower seat;Hand held shower head;Other (comment) (adjustable bed) Additional Comments: feel 5wks PTA, pt reports feeling lightheaded.    Prior Function Prior Level of Function : History of Falls (last  six months);Independent/Modified Independent             Mobility Comments: Mod I using RW in community, no AD in home ADLs Comments: ind in bathing and dressing     Hand Dominance   Dominant Hand: Right    Extremity/Trunk Assessment   Upper Extremity Assessment Upper Extremity Assessment: Generalized weakness    Lower Extremity Assessment Lower Extremity Assessment: Generalized weakness       Communication   Communication: No difficulties  Cognition Arousal/Alertness: Awake/alert Behavior During Therapy: WFL for tasks assessed/performed Overall Cognitive Status: Within Functional Limits for tasks assessed                                          General Comments General comments (skin integrity, edema, etc.): BP: 156/91 upon sitting EOB after pt reported dizziness. HR up to 140s with EOB activity and spiked to 172 upon return to supine position. Following rest and pursed lip breathing HR back down to 108-126 bpm.    Exercises     Assessment/Plan    PT Assessment Patient needs continued PT services  PT Problem List Decreased strength;Decreased range of motion;Decreased activity tolerance;Decreased balance;Decreased mobility;Decreased coordination;Decreased knowledge of use of DME;Decreased safety awareness;Cardiopulmonary status limiting activity       PT Treatment Interventions DME instruction;Gait training;Stair training;Functional mobility training;Therapeutic activities;Therapeutic exercise;Balance training;Neuromuscular re-education;Patient/family education    PT Goals (Current goals can be found in the Care Plan section)  Acute Rehab PT Goals Patient Stated Goal: to get back to baseline PT Goal Formulation: With patient/family Time For Goal Achievement: 01/03/23 Potential to Achieve Goals: Fair    Frequency Min 1X/week     Co-evaluation PT/OT/SLP Co-Evaluation/Treatment: Yes Reason for Co-Treatment: Complexity of the patient's  impairments (multi-system involvement);To address functional/ADL transfers PT goals addressed during session: Mobility/safety with mobility;Balance OT goals addressed during session: ADL's and self-care       AM-PAC PT "6 Clicks" Mobility  Outcome Measure Help needed turning from your back to your side while in a flat bed without using bedrails?: A Lot Help needed moving from lying on your back to sitting on the side of a flat bed without using bedrails?: A Lot Help needed moving to and from a bed to a chair (including a wheelchair)?: Total Help needed standing up from a chair using your arms (e.g., wheelchair or bedside chair)?: Total Help needed to walk in hospital room?: Total Help needed climbing 3-5 steps with a railing? : Total 6 Click Score: 8    End of Session Equipment Utilized During Treatment: Oxygen Activity Tolerance: Treatment limited secondary to medical complications (Comment) (Afib) Patient left: in bed;with call bell/phone within reach;with bed alarm set;with nursing/sitter in room;with family/visitor present Nurse Communication: Mobility status;Other (comment) (HR and NG tube coming out) PT Visit Diagnosis: Other abnormalities of gait and mobility (R26.89);Muscle weakness (generalized) (M62.81)    Time: 1610-9604 PT Time Calculation (min) (ACUTE ONLY): 46 min   Charges:   PT Evaluation $PT Eval High Complexity: 1 High PT Treatments $Therapeutic Activity: 8-22 mins        Shela Nevin, PT, DPT Acute Rehab Services [phone]   Gladys Damme 12/20/2022, 4:45 PM

## 2022-12-20 NOTE — Progress Notes (Signed)
PROGRESS NOTE    [facility]  BJY:[id number] DOB: June 14, 1951 DOA: 12/14/2022 PCP: Paulina Fusi, MD   Brief Narrative:   72 y.o. male past medical history significant of CLL, Paroxysmal A. Fib. on Eliquis, history of colon cancer status post left hemicolectomy more than 10 years ago, GERD who was recently seen at an outside facility [facility]) about 10 days prior to admission and diagnosed with sepsis 2/2 enteritis for which he was discharged on oral antibiotics.  The patient was brought in by family with worsening abdominal pain and confusion.  Associated with watery stools.     Workup revealed findings most consistent with perforated diverticulitis of the small bowel in the right upper quadrant, no abscess formation or free fluid at this time.  The patient is currently on Zosyn.  General surgery following.  Possible exploratory surgery during this admission.  Stool study was negative for C. difficile.  Eliquis held, heparin drip started on 12/17/2022.  Patient underwent exploratory laparotomy, lysis of adhesion and small bowel resection with primary anastomosis by general surgery on 5/15.  Postoperative slow recovery.   Assessment & Plan:  Principal Problem:   Enteritis Active Problems:   Sepsis (HCC)   Depression   CLL (chronic lymphocytic leukemia) (HCC)   Malnutrition of moderate degree    Perforated diverticulitis of the small bowel, POA Status post exploratory laparotomy, lysis of adhesion and small bowel resection with primary anastomosis on 5/15 Routine postop management by general surgery.  On IV Zosyn, advance diet as tolerated. Currently getting TPN.  Mobilize.   Paroxysmal A-fib on Eliquis Anticoagulation resumed.  Intermittent atrial fibrillation with RVR with ambulation.  Will optimize electrolytes.  Will slowly resume p.o. medications once cleared by general surgery in the meantime IV as needed Lopressor   Resolved acute metabolic  encephalopathy: CT was negative for any acute findings. 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   Hx CVA Aspirin, Lipitor  Outpatient recently diagnosed OSA - CPAP ordered but patient refusing  Intermittently p.o. medications will be held until his medical condition allows  DVT prophylaxis: Heparin drip Code Status: Full code Family Communication: Spouse at bedside Status is: Inpatient Ongoing postop management.  Awaiting bowel function to return      Diet Orders (From admission, onward)     Start     Ordered   12/18/22 1441  Diet NPO time specified Except for: Ice Chips  Diet effective midnight       Comments: 1 cup of ice chips per day  Question:  Except for  Answer:  Ice Chips   12/18/22 1440            Subjective: Abdominal pain appears to be little better today.  Denies any nausea and vomiting.  Examination: Constitutional: Not in acute distress Respiratory: Clear to auscultation bilaterally Cardiovascular: Normal sinus rhythm, no rubs Abdomen: Nontender nondistended diminished bowel sounds Musculoskeletal: No edema noted Skin: Surgical scar noted on the abdomen Neurologic: CN 2-12 grossly intact.  And nonfocal Psychiatric: Normal judgment and insight. Alert and oriented x 3. Normal mood.  NG tube in place  Objective: Vitals:   12/19/22 1954 12/20/22 0000 12/20/22 0530 12/20/22 0800  BP: (!) 150/88 (!) 121/100 (!) 164/96 118/85  Pulse: 93 93 (!)  110 94  Resp: 17 12 18 16   Temp: 98.2 F (36.8 C) 97.6 F (36.4 C) 98.5 F (36.9 C) 98.6 F (37 C)  TempSrc: Axillary Axillary Axillary Oral  SpO2: 97% 97%  96%  Weight:   91.4 kg   Height:        Intake/Output Summary (Last 24 hours)  at 12/20/2022 0832 Last data filed at 12/20/2022 0552 Gross per 24 hour  Intake 1102.59 ml  Output 2200 ml  Net -1097.41 ml   Filed Weights   12/18/22 1111 12/19/22 0431 12/20/22 0530  Weight: 95.3 kg 90.5 kg 91.4 kg    Scheduled Meds:  aspirin EC  81 mg Oral Daily   atorvastatin  20 mg Oral Daily   buPROPion  300 mg Oral Daily   Chlorhexidine Gluconate Cloth  6 each Topical Daily   colestipol  1 g Oral Daily   donepezil  5 mg Oral Daily   insulin aspart  0-9 Units Subcutaneous Q6H   melatonin  3 mg Oral QHS   sertraline  25 mg Oral Daily   sodium chloride flush  10-40 mL Intracatheter Q12H   thiamine (VITAMIN B1) injection  100 mg Intravenous Daily   traZODone  100 mg Oral QHS   Continuous Infusions:  sodium chloride 10 mL/hr at 12/19/22 2022   acetaminophen 1,000 mg (12/20/22 0837)   acetaminophen     heparin 1,550 Units/hr (12/20/22 1610)   methocarbamol (ROBAXIN) IV 1,000 mg (12/20/22 0735)   piperacillin-tazobactam (ZOSYN)  IV 3.375 g (12/20/22 0421)   TPN ADULT (ION) 60 mL/hr at 12/19/22 2022    Nutritional status Signs/Symptoms: moderate fat depletion, moderate muscle depletion, mild muscle depletion, energy intake < or equal to 75% for > or equal to 1 month Interventions: TPN, Refer to RD note for recommendations Body mass index is 26.95 kg/m.  Data Reviewed:   CBC: Recent Labs  Lab 12/16/22 0047 12/17/22 0116 12/18/22 0330 12/19/22 0900 12/20/22 0510  WBC 5.1 4.1 4.4 8.5 6.4  HGB 9.6* 9.7* 10.4* 9.9* 9.6*  HCT 29.6* 29.2* 30.7* 29.6* 29.6*  MCV 103.5* 100.7* 98.7 100.7* 102.4*  PLT 143* 154 188 223 209   Basic Metabolic Panel: Recent Labs  Lab 12/16/22 0047 12/17/22 0116 12/18/22 0330 12/19/22 0900 12/20/22 0510  NA 134* 135 136 135 138  K 3.9 3.9 3.9 4.3 3.8  CL 105 104 99 101 100  CO2 24 24 25 24 27   GLUCOSE 99 99 84 137* 107*  BUN 9 7* 8 19 14   CREATININE 0.92 0.99 1.06 1.01 0.80  CALCIUM 8.0* 8.5* 8.7* 8.7* 8.5*  MG  --   --  2.2  2.2 2.1  PHOS  --   --  4.3 2.5 2.4*   GFR: Estimated Creatinine Clearance: 93 mL/min (by C-G formula based on SCr of 0.8 mg/dL). Liver Function Tests: Recent Labs  Lab 12/14/22 2223 12/16/22 0047 12/18/22 0330 12/19/22 0900  AST 25 12* 14* 21  ALT 12 15 16 19   ALKPHOS 120 80 145* 115  BILITOT 0.7 1.3* 0.9 1.0  PROT 5.9* 4.8* 6.0* 6.1*  ALBUMIN 3.7 2.5* 2.7* 2.8*   Recent Labs  Lab 12/14/22 2223  LIPASE 24   No results for input(s): "AMMONIA" in the last 168 hours. Coagulation Profile: No results for input(s): "INR", "PROTIME" in the last 168 hours. Cardiac Enzymes: No results for input(s): "CKTOTAL", "CKMB", "CKMBINDEX", "TROPONINI" in the last 168 hours. BNP (last 3 results) No results for input(s): "PROBNP" in the  last 8760 hours. HbA1C: No results for input(s): "HGBA1C" in the last 72 hours. CBG: Recent Labs  Lab 12/19/22 0544 12/19/22 1111 12/19/22 1715 12/20/22 0012 12/20/22 0551  GLUCAP 158* 123* 129* 137* 106*   Lipid Profile: Recent Labs    12/19/22 0900  TRIG 121   Thyroid Function Tests: No results for input(s): "TSH", "T4TOTAL", "FREET4", "T3FREE", "THYROIDAB" in the last 72 hours. Anemia Panel: No results for input(s): "VITAMINB12", "FOLATE", "FERRITIN", "TIBC", "IRON", "RETICCTPCT" in the last 72 hours. Sepsis Labs: Recent Labs  Lab 12/14/22 2223  LATICACIDVEN 1.7    Recent Results (from the past 240 hour(s))  Culture, blood (routine x 2)     Status: None (Preliminary result)   Collection Time: 12/14/22 10:24 PM   Specimen: BLOOD RIGHT ARM  Result Value Ref Range Status   Specimen Description   Final    BLOOD RIGHT ARM Performed at [facility], [address] [place], [place] [address]    Special Requests   Final    BOTTLES DRAWN AEROBIC AND ANAEROBIC Blood Culture adequate volume Performed at [facility], [address], [place], [place] [address]    Culture   Final    NO GROWTH 4 DAYS Performed at  [facility], [address] [place], [place] [address]    Report Status PENDING  Incomplete  Gastrointestinal Panel by PCR , Stool     Status: None   Collection Time: 12/15/22 12:26 PM   Specimen: STOOL  Result Value Ref Range Status   Campylobacter species NOT DETECTED NOT DETECTED Final   Plesimonas shigelloides NOT DETECTED NOT DETECTED Final   Salmonella species NOT DETECTED NOT DETECTED Final   Yersinia enterocolitica NOT DETECTED NOT DETECTED Final   Vibrio species NOT DETECTED NOT DETECTED Final   Vibrio cholerae NOT DETECTED NOT DETECTED Final   Enteroaggregative E coli (EAEC) NOT DETECTED NOT DETECTED Final   Enteropathogenic E coli (EPEC) NOT DETECTED NOT DETECTED Final   Enterotoxigenic E coli (ETEC) NOT DETECTED NOT DETECTED Final   Shiga like toxin producing E coli (STEC) NOT DETECTED NOT DETECTED Final   Shigella/Enteroinvasive E coli (EIEC) NOT DETECTED NOT DETECTED Final   Cryptosporidium NOT DETECTED NOT DETECTED Final   Cyclospora cayetanensis NOT DETECTED NOT DETECTED Final   Entamoeba histolytica NOT DETECTED NOT DETECTED Final   Giardia lamblia NOT DETECTED NOT DETECTED Final   Adenovirus F40/41 NOT DETECTED NOT DETECTED Final   Astrovirus NOT DETECTED NOT DETECTED Final   Norovirus GI/GII NOT DETECTED NOT DETECTED Final   Rotavirus A NOT DETECTED NOT DETECTED Final   Sapovirus (I, II, IV, and V) NOT DETECTED NOT DETECTED Final    Comment: Performed at [facility], [address] [place], [place] [address]  C Difficile Quick Screen w PCR reflex     Status: None   Collection Time: 12/15/22 12:26 PM   Specimen: STOOL  Result Value Ref Range Status   C Diff antigen NEGATIVE NEGATIVE Final   C Diff toxin NEGATIVE NEGATIVE Final   C Diff interpretation No C. difficile detected.  Final    Comment: Performed at [facility], [address] [place], [place] [address]  Surgical pcr screen     Status: None   Collection Time: 12/18/22  9:02 AM    Specimen: Nasal Mucosa; Nasal Swab  Result Value Ref Range Status   MRSA, PCR NEGATIVE NEGATIVE Final   Staphylococcus aureus NEGATIVE NEGATIVE Final    Comment: (  NOTE) The Xpert SA Assay (FDA approved for NASAL specimens in patients 43 years of age and older), is one component of a comprehensive surveillance program. It is not intended to diagnose infection nor to guide or monitor treatment. Performed at [facility], [address] [place], [place] [address]          Radiology Studies: DG Abd Portable 1V  Result Date: 12/19/2022 CLINICAL DATA:  [id number] Encounter for nasogastric (NG) tube placement [id number] EXAM: PORTABLE ABDOMEN - 1 VIEW COMPARISON:  None Available. FINDINGS: Nasogastric tube tip and side port overlie the proximal stomach. Nonobstructive upper abdominal bowel gas pattern. IMPRESSION: Nasogastric tube tip and side port overlie the proximal stomach, advanced from prior, could advance another 4.0 cm to ensure adequate placement. Electronically Signed   By: Caprice Renshaw M.D.   On: 12/19/2022 12:21   DG Abd Portable 1V  Result Date: 12/19/2022 CLINICAL DATA:  Enteric tube placement EXAM: PORTABLE ABDOMEN - 1 VIEW COMPARISON:  Abdominal radiograph dated 12/18/2022 FINDINGS: Similar position of enteric tube with side hole above the GE junction. Midline surgical staples and surgical clip projecting over the right upper quadrant. Surgical chain sutures within the right hemiabdomen. No abnormal bowel dilation. IMPRESSION: Similar position of enteric tube with side hole above the GE junction. Recommend advancement. Electronically Signed   By: Agustin Cree M.D.   On: 12/19/2022 10:35   DG Abd 1 View  Result Date: 12/18/2022 CLINICAL DATA:  Encounter for imaging study to confirm nasogastric tube placement. EXAM: ABDOMEN - 1 VIEW COMPARISON:  CT of the abdomen and pelvis 5/12/4 FINDINGS: The side port of the NG tube is in the distal esophagus at least 5 cm above the GE junction.  Recommend advancing at least 10 cm for more optimal positioning. Bowel gas pattern is unremarkable. The lung bases are clear. Skin staples are in place. IMPRESSION: NG tube side port is in the distal esophagus at least 5 cm above the GE junction. Recommend advancing at least 10 cm for more optimal positioning. Electronically Signed   By: Marin Roberts M.D.   On: 12/18/2022 14:05           LOS: 5 days   Time spent= 35 mins    [facility] Joline Maxcy, MD [facility]  If 7PM-7AM, please contact night-coverage  12/20/2022, 8:32 AM

## 2022-12-20 NOTE — Progress Notes (Signed)
ANTICOAGULATION CONSULT NOTE  Pharmacy Consult for Heparin (Eliquis on hold) Indication: atrial fibrillation  Allergies  Allergen Reactions   Lisinopril Cough   Zolpidem Other (See Comments)    "Got me in outer space"     Patient Measurements: Height: 6' 0.5" (184.2 cm) Weight: 91.4 kg (201 lb 8 oz) IBW/kg (Calculated) : 78.75 Heparin Dosing Weight: 90 kg  Vital Signs: Temp: 98.6 F (37 C) (05/17 1100) Temp Source: Axillary (05/17 1100) BP: 127/80 (05/17 1600) Pulse Rate: 94 (05/17 1600)  Labs: Recent Labs    12/18/22 0330 12/19/22 0900 12/19/22 2130 12/20/22 0510 12/20/22 1518  HGB 10.4* 9.9*  --  9.6*  --   HCT 30.7* 29.6*  --  29.6*  --   PLT 188 223  --  209  --   APTT  --   --  48* 61* 80*  HEPARINUNFRC  --   --  0.46 0.52  --   CREATININE 1.06 1.01  --  0.80  --      Estimated Creatinine Clearance: 93 mL/min (by C-G formula based on SCr of 0.8 mg/dL).   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 Valentino Hue in [facility] hospital     Depression    GERD (gastroesophageal reflux disease)    controlled with nexium    Hypertension    Pacemaker    Assessment: 72 yr old male on Eliquis 5 mg BID prior to admission for atrial fibrillation.  Eliquis stopped after 8am dose on 5/13 for possible surgery. Discussed with surgery: okay to resume heparin post-op without bolus.  Will use aPTTs for heparin monitoring while heparin levels are expected to be falsely elevated due to recent Eliquis doses.   aPTT 80 - therapeutic  D/w RN no s/sx bleeding, no issues with line or infusion  Goal of Therapy:  Heparin level 0.3-0.7 units/ml aPTT 66-102 seconds Monitor platelets by anticoagulation protocol: Yes   Plan:  Continue heparin at 1550 units/hr Daily aPTT, HL, CBC -f/u transition back to enteral anticoag as  able  Calton Dach, PharmD Clinical Pharmacist 12/20/2022 5:07 PM

## 2022-12-20 NOTE — Progress Notes (Addendum)
2 Days Post-Op  Subjective: CC: Generalized abdominal pain greatest around his incision. Nauseated. NGT output bilious. No flatus or bm. Voiding after foley removal. Oob yesterday.   Afebrile. Tachycardia overnight to 149 (A. Fib with RVR on EKG) that resolved this am after Metoprolol. No hypotension. On 2L. WBC 6.4. Hgb stable at 9.6 from 9.9. Cr improved at 0.8 (1.01). K 3.8. Mg 2.1.   Objective: Vital signs in last 24 hours: Temp:  [97.3 F (36.3 C)-98.6 F (37 C)] 98.6 F (37 C) (05/17 0800) Pulse Rate:  [74-149] 94 (05/17 0800) Resp:  [12-18] 16 (05/17 0800) BP: (110-189)/(81-116) 118/85 (05/17 0800) SpO2:  [93 %-98 %] 96 % (05/17 0800) Weight:  [91.4 kg] 91.4 kg (05/17 0530) Last BM Date : 12/18/22  Intake/Output from previous day: 05/16 0701 - 05/17 0700 In: 1102.6 [I.V.:820.6; NG/GT:25; IV Piggyback:257] Out: 2400 [Urine:2200; Emesis/NG output:200] Intake/Output this shift: No intake/output data recorded.  PE: Gen:  Alert, NAD, pleasant Abd: Soft, mild distension, appropriately tender around incision, no rigidity or guarding and otherwise NT, +BS. Midline wound with honeycomb dressing in place with dried blood on the superior aspect but no signs of active bleeding - otherwise cdi. NGT on LIWS w/ bilious output.  Ext:  No LE edema Neuro: CN 3-12 grossly intact. MAE's. Grip strength equal b/l. Hip flexion strength equal b/l.  Psych: A&Ox3   Lab Results:  Recent Labs    12/19/22 0900 12/20/22 0510  WBC 8.5 6.4  HGB 9.9* 9.6*  HCT 29.6* 29.6*  PLT 223 209   BMET Recent Labs    12/19/22 0900 12/20/22 0510  NA 135 138  K 4.3 3.8  CL 101 100  CO2 24 27  GLUCOSE 137* 107*  BUN 19 14  CREATININE 1.01 0.80  CALCIUM 8.7* 8.5*   PT/INR No results for input(s): "LABPROT", "INR" in the last 72 hours. CMP     Component Value Date/Time   NA 138 12/20/2022 0510   NA 138 05/23/2021 0000   K 3.8 12/20/2022 0510   CL 100 12/20/2022 0510   CO2 27 12/20/2022  0510   GLUCOSE 107 (H) 12/20/2022 0510   BUN 14 12/20/2022 0510   BUN 16 05/23/2021 0000   CREATININE 0.80 12/20/2022 0510   CALCIUM 8.5 (L) 12/20/2022 0510   PROT 6.1 (L) 12/19/2022 0900   ALBUMIN 2.8 (L) 12/19/2022 0900   AST 21 12/19/2022 0900   ALT 19 12/19/2022 0900   ALKPHOS 115 12/19/2022 0900   BILITOT 1.0 12/19/2022 0900   GFRNONAA >60 12/20/2022 0510   GFRAA >60 05/13/2019 0312   Lipase     Component Value Date/Time   LIPASE 24 12/14/2022 2223    Studies/Results: DG Abd Portable 1V  Result Date: 12/19/2022 CLINICAL DATA:  [id number] Encounter for nasogastric (NG) tube placement [id number] EXAM: PORTABLE ABDOMEN - 1 VIEW COMPARISON:  None Available. FINDINGS: Nasogastric tube tip and side port overlie the proximal stomach. Nonobstructive upper abdominal bowel gas pattern. IMPRESSION: Nasogastric tube tip and side port overlie the proximal stomach, advanced from prior, could advance another 4.0 cm to ensure adequate placement. Electronically Signed   By: Caprice Renshaw M.D.   On: 12/19/2022 12:21   DG Abd Portable 1V  Result Date: 12/19/2022 CLINICAL DATA:  Enteric tube placement EXAM: PORTABLE ABDOMEN - 1 VIEW COMPARISON:  Abdominal radiograph dated 12/18/2022 FINDINGS: Similar position of enteric tube with side hole above the GE junction. Midline surgical staples and surgical clip projecting over the right  upper quadrant. Surgical chain sutures within the right hemiabdomen. No abnormal bowel dilation. IMPRESSION: Similar position of enteric tube with side hole above the GE junction. Recommend advancement. Electronically Signed   By: Agustin Cree M.D.   On: 12/19/2022 10:35   DG Abd 1 View  Result Date: 12/18/2022 CLINICAL DATA:  Encounter for imaging study to confirm nasogastric tube placement. EXAM: ABDOMEN - 1 VIEW COMPARISON:  CT of the abdomen and pelvis 5/12/4 FINDINGS: The side port of the NG tube is in the distal esophagus at least 5 cm above the GE junction. Recommend advancing at  least 10 cm for more optimal positioning. Bowel gas pattern is unremarkable. The lung bases are clear. Skin staples are in place. IMPRESSION: NG tube side port is in the distal esophagus at least 5 cm above the GE junction. Recommend advancing at least 10 cm for more optimal positioning. Electronically Signed   By: Marin Roberts M.D.   On: 12/18/2022 14:05    Anti-infectives: Anti-infectives (From admission, onward)    Start     Dose/Rate Route Frequency Ordered Stop   12/19/22 2000  piperacillin-tazobactam (ZOSYN) IVPB 3.375 g        3.375 g 12.5 mL/hr over 240 Minutes Intravenous Every 8 hours 12/19/22 1550     12/16/22 1100  piperacillin-tazobactam (ZOSYN) IVPB 3.375 g  Status:  Discontinued        3.375 g 12.5 mL/hr over 240 Minutes Intravenous Every 8 hours 12/16/22 0954 12/19/22 1550   12/16/22 0400  ampicillin-sulbactam (UNASYN) 1.5 g in sodium chloride 0.9 % 100 mL IVPB  Status:  Discontinued        1.5 g 200 mL/hr over 30 Minutes Intravenous Every 6 hours 12/16/22 0258 12/16/22 0855   12/15/22 1630  piperacillin-tazobactam (ZOSYN) IVPB 3.375 g  Status:  Discontinued        3.375 g 12.5 mL/hr over 240 Minutes Intravenous Every 8 hours 12/15/22 1528 12/16/22 0307   12/15/22 1000  azithromycin (ZITHROMAX) tablet 500 mg  Status:  Discontinued        500 mg Oral Daily 12/15/22 0900 12/16/22 0258   12/15/22 0045  piperacillin-tazobactam (ZOSYN) IVPB 3.375 g        3.375 g 12.5 mL/hr over 240 Minutes Intravenous  Once 12/15/22 0044 12/15/22 0446        Assessment/Plan POD 2 s/p Exploratory laparotomy, lysis of adhesions, small bowel resection with primary anastomosis for Contained perforation of jejunal diverticulum by Dr. Corliss Skains on 12/18/22 - Path pending - Cont NGT, AROBF - Cont TPN - IV Tylenol and Robaxin while NGT in place. PRN dilaudid.  - Cont abx, plan 5d post op - Mobilize - PT/OT - Pulm toilet, IS, wean o2 as able   FEN - NPO, NGT to LIWS, PICC/TPN VTE - SCDs,  heparin gtt.  ID - Zosyn Foley - Out POD 1, voiding.     CLL (last chemo in November but on venetoclax)  Recent resp issues - bronchitis, pneumonia, RSV  A. Fib/Bradycardia - pacemaker placement about a month ago      LOS: 5 days    Jacinto Halim , [facility] 12/20/2022, 8:28 AM Please see Amion for pager number during day hours 7:00am-4:30pm

## 2022-12-20 NOTE — Evaluation (Addendum)
Occupational Therapy Evaluation Patient Details Name: Jonathon Parker [facility] MRN: [id number] DOB: Aug 09, 1950 Today's Date: 12/20/2022   History of Present Illness 71 y.o. male presented to the ED with with worsening abdominal pain and confusion. Past Medical History of HTN, R thalamic CVA, GERD, Depression, enteritis, sepsis, mild cognitive impairment. He is now s/p small bowel resection with primary anastomosis.   Clinical Impression   Pt admitted for above dx, PTA pt lived with spouse and reports being independent in bathing/dressing and ambulating with RW in community but no AD in home. Pt currently severely limited by pain, needing Max A to get to EOB but not able to tolerate EOB sitting for long which can also be attributed to decreased core strength. Pt also limited by HR, which limited today's OT session to bedside ADLs. Educated pt to perform log rolls with assist while supine in bed to provide pressure relief. Pt would benefit from continued acute skilled OT services to address above deficits and help transition to next level of care. Patient would benefit from post acute skilled rehab facility with <3 hours of therapy and 24/7 support at this time, may be able to progress further while in acute setting pending progression with therapy and pain tolerance.     Recommendations for follow up therapy are one component of a multi-disciplinary discharge planning process, led by the attending physician.  Recommendations may be updated based on patient status, additional functional criteria and insurance authorization.   Assistance Recommended at Discharge Frequent or constant Supervision/Assistance  Patient can return home with the following Two people to help with walking and/or transfers;A lot of help with bathing/dressing/bathroom;Assistance with cooking/housework;Assist for transportation;Assistance with feeding;Help with stairs or ramp for entrance    Functional Status Assessment  Patient  has had a recent decline in their functional status and demonstrates the ability to make significant improvements in function in a reasonable and predictable amount of time.  Equipment Recommendations  Other (comment) (defer to next level of care)    Recommendations for Other Services       Precautions / Restrictions Precautions Precautions: Fall Precaution Comments: abdominal binder, NGT Restrictions Weight Bearing Restrictions: No      Mobility Bed Mobility Overal bed mobility: Needs Assistance Bed Mobility: Supine to Sit, Sit to Supine     Supine to sit: Max assist Sit to supine: Max assist, +2 for physical assistance        Transfers                   General transfer comment: deferred      Balance Overall balance assessment: Needs assistance Sitting-balance support: Feet supported Sitting balance-Leahy Scale: Fair Sitting balance - Comments: Pt not able to tolerate sitting balance for long due to abdominal pain       Standing balance comment: deferred                           ADL either performed or assessed with clinical judgement   ADL Overall ADL's : Needs assistance/impaired Eating/Feeding: NPO   Grooming: Sitting;Minimal assistance Grooming Details (indicate cue type and reason): Pt unable to tolerate sitting balance for extended periods Upper Body Bathing: Bed level;Set up   Lower Body Bathing: Maximal assistance;Bed level   Upper Body Dressing : Sitting;Moderate assistance   Lower Body Dressing: Maximal assistance;Bed level   Toilet Transfer: Maximal assistance;+2 for physical assistance   Toileting- Clothing Manipulation and Hygiene: Maximal assistance;Sitting/lateral lean  Tub/ Shower Transfer: Maximal assistance;+2 for physical assistance     General ADL Comments: Pt limited by HR and intense abdominal pain. OOB mobility deferred     Vision         Perception     Praxis      Pertinent Vitals/Pain Pain  Assessment Pain Assessment: 0-10 Pain Score: 8  Pain Location: belly Pain Descriptors / Indicators: Aching, Discomfort, Guarding Pain Intervention(s): Limited activity within patient's tolerance, Monitored during session, Premedicated before session, Repositioned     Hand Dominance Right   Extremity/Trunk Assessment Upper Extremity Assessment Upper Extremity Assessment: Generalized weakness   Lower Extremity Assessment Lower Extremity Assessment: Generalized weakness       Communication Communication Communication: No difficulties   Cognition Arousal/Alertness: Awake/alert Behavior During Therapy: WFL for tasks assessed/performed Overall Cognitive Status: Within Functional Limits for tasks assessed                                       General Comments  BP: 156/91 upon sitting EOB after pt reported dizziness. HR up to 140s with EOB activity and spiked to 172 upon return to supine position. Following rest and pursed lip breathing HR back down to 108-126 bpm.    Exercises     Shoulder Instructions      Home Living Family/patient expects to be discharged to:: Private residence Living Arrangements: Spouse/significant other Available Help at Discharge: Family;Available 24 hours/day Type of Home: House Home Access: Stairs to enter [employer] of Steps: 7 back entrance Entrance Stairs-Rails: Right;Can reach both;Left Home Layout: Two level;Able to live on main level with bedroom/bathroom Alternate Level Stairs-Number of Steps: 1 flight Alternate Level Stairs-Rails: Right;Left;Can reach both Bathroom Shower/Tub: Producer, television/film/video: Handicapped height Bathroom Accessibility: Yes How Accessible: Accessible via walker Home Equipment: Rolling Walker (2 wheels);Cane - single point;Wheelchair - manual;Cane - quad;Shower seat;Hand held shower head;Other (comment) (adjustable bed)   Additional Comments: feel 5wks PTA, pt reports feeling  lightheaded.      Prior Functioning/Environment Prior Level of Function : History of Falls (last six months);Independent/Modified Independent             Mobility Comments: Mod I using RW in community, no AD in home ADLs Comments: ind in bathing and dressing        OT Problem List: Pain;Decreased strength;Impaired balance (sitting and/or standing);Decreased activity tolerance      OT Treatment/Interventions: Therapeutic exercise;Therapeutic activities;Self-care/ADL training;Energy conservation;DME and/or AE instruction;Balance training;Patient/family education    OT Goals(Current goals can be found in the care plan section) Acute Rehab OT Goals Patient Stated Goal: To get back to baseline OT Goal Formulation: With patient Time For Goal Achievement: 01/03/23 Potential to Achieve Goals: Good ADL Goals Pt Will Perform Grooming: sitting;with supervision Pt Will Transfer to Toilet: stand pivot transfer;with min assist;bedside commode Additional ADL Goal #1: Pt will tolerate EOB sitting for 5 mins in prep for seated ADLs Additional ADL Goal #3: Pt will demonstrate compliance with use of log rolling for bed mobility and pressure relief withphysical  Min A Additional ADL Goal #4: Pt will complete lateral scooting to/from recliner with Min A  OT Frequency: Min 2X/week    Co-evaluation PT/OT/SLP Co-Evaluation/Treatment: Yes Reason for Co-Treatment: Complexity of the patient's impairments (multi-system involvement);To address functional/ADL transfers PT goals addressed during session: Mobility/safety with mobility;Balance OT goals addressed during session: ADL's and self-care  AM-PAC OT "6 Clicks" Daily Activity     Outcome Measure Help from another person eating meals?: Total Help from another person taking care of personal grooming?: A Little Help from another person toileting, which includes using toliet, bedpan, or urinal?: A Lot Help from another person bathing (including  washing, rinsing, drying)?: A Lot Help from another person to put on and taking off regular upper body clothing?: A Lot Help from another person to put on and taking off regular lower body clothing?: A Lot 6 Click Score: 12   End of Session Nurse Communication: Mobility status;Precautions (watch HR)  Activity Tolerance: Patient limited by pain Patient left: in bed;with call bell/phone within reach;with bed alarm set  OT Visit Diagnosis: Muscle weakness (generalized) (M62.81);Pain;Unsteadiness on feet (R26.81) Pain - part of body:  (abdominal)                Time: 4098-1191 OT Time Calculation (min): 46 min Charges:  OT General Charges $OT Visit: 1 Visit OT Evaluation $OT Eval Moderate Complexity: 1 Mod  12/20/2022  AB, OTR/L  Acute Rehabilitation Services  Office: [phone]   Tristan Schroeder 12/20/2022, 5:50 PM

## 2022-12-20 NOTE — Progress Notes (Signed)
PHARMACY - TOTAL PARENTERAL NUTRITION CONSULT NOTE  Indication: Probable small bowel diverticula perforation   Patient Measurements: Height: 6' 0.5" (184.2 cm) Weight: 91.4 kg (201 lb 8 oz) IBW/kg (Calculated) : 78.75 TPN AdjBW (KG): 95.3 Body mass index is 26.95 kg/m.  Assessment:  47 YOM presented to [facility] on 5/11 with fevers, abdominal pain and confusion.  PMH significant for CLL, and colon cancer post L hemicolectomy.  Recent history of pacemaker placement for Afib/brady and enteritis.  CT showed small bowel diverticulitis vs enteritis vs SBO with ischemia.  Patient was on a CLD on 5/13 and began having increased abdominal pain.  Surgery plans to perform an ex-lap with SBR.  Patient reports eating two meals a day and intake was reduced to ~50% given his abdominal pain that started months ago.  Patient feels that he lost weight but is unsure of how much.  Glucose / Insulin: no hx DM - CBGs < 180 Used 2 units insulin aspart Electrolytes: Na 138, K 3.8, Ca 8.5 [CoCa 9.46] , Mag 2.1, mild refeeding- (Phos 4.3 >> 2.5 >> 2.4) Renal: SCr <1, BUN WNL Hepatic: LFTs WNL, tbili normalized, albumin 2.8 Intake / Output; MIVF: UOP 0.5 ml/kg/hr, LBM 5/15, NG , NS 75 ml/hr GI Imaging: none since TPN initiation GI Surgeries / Procedures:  5/15 ex-lap with LoA, SBR with primary anastomosis  Central access: PICC placed 12/17/22 TPN start date: 12/18/22  Nutritional Goals: RD Estimated Needs Total Energy Estimated Needs: 2400-2600 Total Protein Estimated Needs: 120-135g Total Fluid Estimated Needs: >/=2L  Current Nutrition:  TPN  Plan:  Increase TPN to 75 mL/hr at 1800 (goal rate 95 ml/hr) TPN provides 79g AA, 223g CHO, 46g ILE and 1536 kCal, meeting ~85% of needs Electrolytes in TPN: Na 75 mEq/L, K 40 mEq/L, Ca 5 mEq/L, Mg 5 mEq/L, Phos 20 mmol/L, Cl:Ac 1:1 Add standard MVI and trace elements to TPN Continue moderate SSI Q4H Thiamine 100mg  IV daily x 5 days NaPhos  IV Potassium runs x 4 Standard TPN labs on Mon and Thurs - labs in AM  Reyan Helle BS, PharmD, BCPS Clinical Pharmacist 12/20/2022 9:04 AM  Contact: [phone] after 3 PM  "Be curious, not judgmental..." -Debbora Dus

## 2022-12-20 NOTE — Progress Notes (Signed)
TRH night cross cover note:   I was notified by RN that the patient has some "gurgling in his throat" and that he is having difficulty clearing the secretions due to inability to generate forcible cough in the setting of his recent abdominal surgery.  Oral suctioning has been attempted, although some gurgling remains at this time.  I subsequently placed order for scopolamine patch to assist with management of the secretions.  I also added humidity to the patient's supplemental oxygen.   Update: RN conveyed to me that the patient's wife is concerned for volume overload in the setting of the above.   Per my chart review, his most recent echocardiogram occurred in April 2022, which showed a normal LV EF, but also showed evidence of grade 1 diastolic dysfunction.  Per my discussions with the patient's RN, in spite of receiving TPN, he has a net negative fluid balance of 1100 cc over the course of the last day, as well as a net negative fluid balance of 6.5 L over the course of the 6-day hospitalization.  I ordered BNP, procalcitonin level.  I have also ordered a chest x-ray, which showed no evidence of acute cardiopulmonary process, including no evidence of infiltrate, edema, or effusion, while also noting persistence of right hemidiaphragm.    Chest x-ray also showed further retraction of the patient's NG tube relative to plain films performed around 1600 on 12/20/2022, with NG tube now appearing to be in the more proximal portion of the esophagus.  I requested that NG tube be advanced approximately 6 cm, and I have ordered a repeat plain film to further assess for post repositioning of the NG tube.   In the setting of a known history of paroxysmal atrial fibrillation, for which the patient's home scheduled AV nodal blockers are being held as a result of current n.p.o. status, he is noted to be in atrial fibrillation with RVR, with sustained heart rates in the 130s to 140s, with systolic blood pressures  noted to be in the 120s.  Patient denies any recent chest pain, but notes some mild shortness of breath associated, which she attributed to difficulty in managing the above secretions.  It is noted that ensuing risk for development of pneumonia is diminished given that he continues to receive IV Zosyn in the context of his presenting perforated diverticulitis.  I subsequently ordered diltiazem drip for improvement in rate control relating to his atrial fibrillation.    Newton Pigg, DO Hospitalist

## 2022-12-20 NOTE — Progress Notes (Signed)
Pharmacy Antibiotic Note  Jonathon Parker is a 72 y.o. male admitted on 12/14/2022 with fevers and abdominal pain.  Pharmacy has been consulted for Zosyn dosing. Zosyn was started initially on 5/12 and received 2 doses, then changed to Unasyn early am.  Now back to Zosyn.   Hx antibiotics with recent admit at OSH, completed antibiotics for enteritis on 12/10/22.   Plan: Continue Zosyn 3.375 gm IV q8h (each over 4 hours) F/u with Surgery service for LOT  Height: 6' 0.5" (184.2 cm) Weight: 91.4 kg (201 lb 8 oz) IBW/kg (Calculated) : 78.75  Temp (24hrs), Avg:97.9 F (36.6 C), Min:97.3 F (36.3 C), Max:98.5 F (36.9 C)  Recent Labs  Lab 12/14/22 2223 12/15/22 0925 12/16/22 0047 12/17/22 0116 12/18/22 0330 12/19/22 0900 12/20/22 0510  WBC 11.0*   < > 5.1 4.1 4.4 8.5 6.4  CREATININE 1.00   < > 0.92 0.99 1.06 1.01 0.80  LATICACIDVEN 1.7  --   --   --   --   --   --    < > = values in this interval not displayed.     Estimated Creatinine Clearance: 93 mL/min (by C-G formula based on SCr of 0.8 mg/dL).    Allergies  Allergen Reactions   Lisinopril Cough   Zolpidem Other (See Comments)    "Got me in outer space"     Antimicrobials this admission: Azithromycin PO x 1 on 5/12 Zosyn 5/12>>5/13 0034> resume 5/13 >> Unasyn 5/13 x 2 doses   Dose adjustments this admission: n/a  Microbiology results: 5/12 blood: ng < 12 hrs to date 5/12 GI panel: negative 5/12 C diff: negative  Bao Coreas A. Jeanella Craze, PharmD, BCPS, FNKF Clinical Pharmacist [place] Please utilize Amion for appropriate phone number to reach the unit pharmacist [facility])  12/20/2022 7:22 AM

## 2022-12-21 ENCOUNTER — Inpatient Hospital Stay (HOSPITAL_COMMUNITY): Payer: Medicare PPO

## 2022-12-21 DIAGNOSIS — K529 Noninfective gastroenteritis and colitis, unspecified: Secondary | ICD-10-CM | POA: Diagnosis not present

## 2022-12-21 LAB — GLUCOSE, CAPILLARY
Glucose-Capillary: 115 mg/dL — ABNORMAL HIGH (ref 70–99)
Glucose-Capillary: 117 mg/dL — ABNORMAL HIGH (ref 70–99)
Glucose-Capillary: 119 mg/dL — ABNORMAL HIGH (ref 70–99)
Glucose-Capillary: 134 mg/dL — ABNORMAL HIGH (ref 70–99)
Glucose-Capillary: 151 mg/dL — ABNORMAL HIGH (ref 70–99)

## 2022-12-21 LAB — BASIC METABOLIC PANEL
Anion gap: 10 (ref 5–15)
BUN: 18 mg/dL (ref 8–23)
CO2: 28 mmol/L (ref 22–32)
Calcium: 8.9 mg/dL (ref 8.9–10.3)
Chloride: 100 mmol/L (ref 98–111)
Creatinine, Ser: 0.88 mg/dL (ref 0.61–1.24)
GFR, Estimated: >60 mL/min (ref >60)
Glucose, Bld: 124 mg/dL — ABNORMAL HIGH (ref 70–99)
Potassium: 4 mmol/L (ref 3.5–5.1)
Sodium: 138 mmol/L (ref 135–145)

## 2022-12-21 LAB — CBC
MCHC: 32.5 g/dL (ref 30.0–36.0)
MCV: 102.1 fL — ABNORMAL HIGH (ref 80.0–100.0)
RDW: 12.8 % (ref 11.5–15.5)
nRBC: 0 % (ref 0.0–0.2)

## 2022-12-21 LAB — MAGNESIUM: Magnesium: 2 mg/dL (ref 1.7–2.4)

## 2022-12-21 LAB — PHOSPHORUS: Phosphorus: 3.6 mg/dL (ref 2.5–4.6)

## 2022-12-21 LAB — BRAIN NATRIURETIC PEPTIDE: B Natriuretic Peptide: 336.7 pg/mL — ABNORMAL HIGH (ref 0.0–100.0)

## 2022-12-21 LAB — HEPARIN LEVEL (UNFRACTIONATED): Heparin Unfractionated: 0.53 [IU]/mL (ref 0.30–0.70)

## 2022-12-21 MED ORDER — TRAVASOL 10 % IV SOLN
INTRAVENOUS | Status: AC
  Filled 2022-12-21: qty 1254

## 2022-12-21 MED ORDER — DILTIAZEM HCL-DEXTROSE 125-5 MG/125ML-% IV SOLN (PREMIX)
5.0000 mg/h | INTRAVENOUS | Status: DC
  Administered 2022-12-21 (×2): 10 mg/h via INTRAVENOUS
  Administered 2022-12-21: 5 mg/h via INTRAVENOUS
  Administered 2022-12-21 – 2022-12-25 (×5): 10 mg/h via INTRAVENOUS
  Filled 2022-12-21 (×10): qty 125

## 2022-12-21 NOTE — Progress Notes (Signed)
PHARMACY - TOTAL PARENTERAL NUTRITION CONSULT NOTE  Indication: Small bowel diverticula perforation   Patient Measurements: Height: 6' 0.5" (184.2 cm) Weight: 94.7 kg (208 lb 12.4 oz) IBW/kg (Calculated) : 78.75 TPN AdjBW (KG): 95.3 Body mass index is 27.93 kg/m.  Assessment:  89 YOM presented to [facility] on 5/11 with fevers, abdominal pain and confusion.  PMH significant for CLL, and colon cancer post L hemicolectomy.  Recent history of pacemaker placement for Afib/brady and enteritis.  CT showed small bowel diverticulitis vs enteritis vs SBO with ischemia.  Patient was on a CLD on 5/13 and began having increased abdominal pain.  Surgery plans to perform an ex-lap with SBR.  Patient reports eating two meals a day and intake was reduced to ~50% given his abdominal pain that started months ago.  Patient feels that he lost weight but is unsure of how much.  Glucose / Insulin: no hx DM - CBGs < 180 Electrolytes: Na 138, K 4, Ca 8.5 [CoCa 9.46] , Mag 2, Phos 3.6 Renal: SCr <1, BUN WNL Hepatic: LFTs WNL, tbili normalized, albumin 2.8 Intake / Output; MIVF: UOP 0.2 ml/kg/hr, LBM 5/15, NG GI Imaging: none since TPN initiation GI Surgeries / Procedures:  5/15 ex-lap with LoA, SBR with primary anastomosis  Central access: PICC placed 12/17/22 TPN start date: 12/18/22  Nutritional Goals: RD Estimated Needs Total Energy Estimated Needs: 2400-2600 Total Protein Estimated Needs: 120-135g Total Fluid Estimated Needs: >/=2L  Current Nutrition:  TPN  Plan:  Increase TPN to 95 mL/hr at 1800 (goal rate 95 ml/hr) Electrolytes in TPN: Na 75 mEq/L, K 40 mEq/L, Ca 5 mEq/L, Mg 5 mEq/L, Phos 20 mmol/L, Cl:Ac 1:1 Add standard MVI and trace elements to TPN Continue moderate SSI Q4H Thiamine 100mg  IV daily x 5 days Standard TPN labs on Mon and Thurs - labs in AM  Jeanella Cara, PharmD, [place] Clinical Pharmacist Please see AMION for all Pharmacists' Contact Phone Numbers 12/21/2022, 9:30 AM

## 2022-12-21 NOTE — Progress Notes (Signed)
PROGRESS NOTE    Jonathon Parker [facility]  ZOX:[id number] DOB: Jul 11, 1951 DOA: 12/14/2022 PCP: Paulina Fusi, MD   Brief Narrative:   72 y.o. male past medical history significant of CLL, Paroxysmal A. Fib. on Eliquis, history of colon cancer status post left hemicolectomy more than 10 years ago, GERD who was recently seen at an outside facility [facility]) about 10 days prior to admission and diagnosed with sepsis 2/2 enteritis for which he was discharged on oral antibiotics.  The patient was brought in by family with worsening abdominal pain and confusion.  Associated with watery stools.     Workup revealed findings most consistent with perforated diverticulitis of the small bowel in the right upper quadrant, no abscess formation or free fluid at this time.  The patient is currently on Zosyn.  General surgery following.  Possible exploratory surgery during this admission.  Stool study was negative for C. difficile.  Eliquis held, heparin drip started on 12/17/2022.  Patient underwent exploratory laparotomy, lysis of adhesion and small bowel resection with primary anastomosis by general surgery on 5/15.  Postoperative slow recovery.   Assessment & Plan:  Principal Problem:   Enteritis Active Problems:   Sepsis (HCC)   Depression   CLL (chronic lymphocytic leukemia) (HCC)   Malnutrition of moderate degree    Perforated diverticulitis of the small bowel, POA Status post exploratory laparotomy, lysis of adhesion and small bowel resection with primary anastomosis on 5/15 Routine postop management by general surgery.  On IV Zosyn, advance diet as tolerated. Currently getting TPN.  Mobilize. 12/21/2022: Stable.  Surgical team is directing care.   Paroxysmal A-fib on Eliquis Anticoagulation resumed.  Intermittent atrial fibrillation with RVR with ambulation.  Will optimize electrolytes.  Will slowly resume p.o. medications once cleared by general surgery in the meantime IV as  needed Lopressor   Resolved acute metabolic encephalopathy: CT was negative for any acute findings. Reorient as needed 12/21/2022: Resolved.   Depression: Continue Wellbutrin   Dementia without behavioral disturbances: Continue home aricept, seroquel   CLL (chronic lymphocytic leukemia) (HCC) No acute issues related to CLL Follow-up with hematology oncology   Macrocytic anemia: Noted follow-up with PCP as an outpatient.   Hx CVA Aspirin, Lipitor  Outpatient recently diagnosed OSA - CPAP ordered but patient refusing  Intermittently p.o. medications will be held until his medical condition allows  DVT prophylaxis: Heparin drip Code Status: Full code Family Communication: Spouse at bedside Status is: Inpatient Ongoing postop management.  Awaiting bowel function to return      Diet Orders (From admission, onward)     Start     Ordered   12/18/22 1441  Diet NPO time specified Except for: Ice Chips  Diet effective midnight       Comments: 1 cup of ice chips per day  Question:  Except for  Answer:  Ice Chips   12/18/22 1440            Subjective: Patient seen. No new complaints.    Examination: Constitutional: Not in acute distress Respiratory: Clear to auscultation Cardiovascular: S1-S2. Abdomen: Soft and nontender.   Musculoskeletal: No edema noted Neurologic:  Patient is awake and alert.     Objective: Vitals:   12/21/22 0020 12/21/22 0200 12/21/22 0548 12/21/22 0800  BP: 136/89  132/86 (!) 129/101  Pulse: 76 98 (!) 108 (!) 110  Resp: 11 19 20 15   Temp: 97.8 F (36.6 C) 97.9 F (36.6 C) (!) 97.5 F (36.4 C) 97.8 F (  36.6 C)  TempSrc: Oral  Oral Oral  SpO2: 98% 98% 98% 99%  Weight:   94.7 kg   Height:        Intake/Output Summary (Last 24 hours) at 12/21/2022 1025 Last data filed at 12/21/2022 0900 Gross per 24 hour  Intake 120 ml  Output 1100 ml  Net -980 ml    Filed Weights   12/19/22 0431 12/20/22 0530 12/21/22 0548  Weight: 90.5  kg 91.4 kg 94.7 kg    Scheduled Meds:  aspirin EC  81 mg Oral Daily   atorvastatin  20 mg Oral Daily   buPROPion  300 mg Oral Daily   Chlorhexidine Gluconate Cloth  6 each Topical Daily   colestipol  1 g Oral Daily   donepezil  5 mg Oral Daily   furosemide  40 mg Intravenous Once   insulin aspart  0-9 Units Subcutaneous Q6H   melatonin  3 mg Oral QHS   scopolamine  1 patch Transdermal Q72H   sertraline  25 mg Oral Daily   sodium chloride flush  10-40 mL Intracatheter Q12H   thiamine (VITAMIN B1) injection  100 mg Intravenous Daily   traZODone  100 mg Oral QHS   Continuous Infusions:  sodium chloride 10 mL/hr at 12/19/22 2022   diltiazem (CARDIZEM) infusion 10 mg/hr (12/21/22 0937)   heparin 1,550 Units/hr (12/21/22 0345)   methocarbamol (ROBAXIN) IV 1,000 mg (12/21/22 0656)   piperacillin-tazobactam (ZOSYN)  IV 3.375 g (12/21/22 0350)   TPN ADULT (ION) 75 mL/hr at 12/20/22 1813   TPN ADULT (ION)      Nutritional status Signs/Symptoms: moderate fat depletion, moderate muscle depletion, mild muscle depletion, energy intake < or equal to 75% for > or equal to 1 month Interventions: TPN, Refer to RD note for recommendations Body mass index is 27.93 kg/m.  Data Reviewed:   CBC: Recent Labs  Lab 12/17/22 0116 12/18/22 0330 12/19/22 0900 12/20/22 0510 12/21/22 0435  WBC 4.1 4.4 8.5 6.4 3.7*  HGB 9.7* 10.4* 9.9* 9.6* 14.5  HCT 29.2* 30.7* 29.6* 29.6* 44.6  MCV 100.7* 98.7 100.7* 102.4* 102.1*  PLT 154 188 223 209 166    Basic Metabolic Panel: Recent Labs  Lab 12/17/22 0116 12/18/22 0330 12/19/22 0900 12/20/22 0510 12/21/22 0550  NA 135 136 135 138 138  K 3.9 3.9 4.3 3.8 4.0  CL 104 99 101 100 100  CO2 24 25 24 27 28   GLUCOSE 99 84 137* 107* 124*  BUN 7* 8 19 14 18   CREATININE 0.99 1.06 1.01 0.80 0.88  CALCIUM 8.5* 8.7* 8.7* 8.5* 8.9  MG  --  2.2 2.2 2.1 2.0  PHOS  --  4.3 2.5 2.4* 3.6    GFR: Estimated Creatinine Clearance: 91.4 mL/min (by C-G formula  based on SCr of 0.88 mg/dL). Liver Function Tests: Recent Labs  Lab 12/14/22 2223 12/16/22 0047 12/18/22 0330 12/19/22 0900  AST 25 12* 14* 21  ALT 12 15 16 19   ALKPHOS 120 80 145* 115  BILITOT 0.7 1.3* 0.9 1.0  PROT 5.9* 4.8* 6.0* 6.1*  ALBUMIN 3.7 2.5* 2.7* 2.8*    Recent Labs  Lab 12/14/22 2223  LIPASE 24    No results for input(s): "AMMONIA" in the last 168 hours. Coagulation Profile: No results for input(s): "INR", "PROTIME" in the last 168 hours. Cardiac Enzymes: No results for input(s): "CKTOTAL", "CKMB", "CKMBINDEX", "TROPONINI" in the last 168 hours. BNP (last 3 results) No results for input(s): "PROBNP" in the last 8760 hours.  HbA1C: No results for input(s): "HGBA1C" in the last 72 hours. CBG: Recent Labs  Lab 12/20/22 0012 12/20/22 0551 12/20/22 1157 12/21/22 0022 12/21/22 0551  GLUCAP 137* 106* 116* 117* 115*    Lipid Profile: Recent Labs    12/19/22 0900  TRIG 121    Thyroid Function Tests: No results for input(s): "TSH", "T4TOTAL", "FREET4", "T3FREE", "THYROIDAB" in the last 72 hours. Anemia Panel: No results for input(s): "VITAMINB12", "FOLATE", "FERRITIN", "TIBC", "IRON", "RETICCTPCT" in the last 72 hours. Sepsis Labs: Recent Labs  Lab 12/14/22 2223 12/20/22 2300  PROCALCITON  --  0.20  LATICACIDVEN 1.7  --      Recent Results (from the past 240 hour(s))  Culture, blood (routine x 2)     Status: None   Collection Time: 12/14/22 10:24 PM   Specimen: BLOOD RIGHT ARM  Result Value Ref Range Status   Specimen Description   Final    BLOOD RIGHT ARM Performed at [facility], [address] [place], [place] [address]    Special Requests   Final    BOTTLES DRAWN AEROBIC AND ANAEROBIC Blood Culture adequate volume Performed at [facility], [address], [place], [place] [address]    Culture   Final    NO GROWTH 5 DAYS Performed at [facility], [address], [place], [place] [address]     Report Status 12/20/2022 FINAL  Final  Gastrointestinal Panel by PCR , Stool     Status: None   Collection Time: 12/15/22 12:26 PM   Specimen: STOOL  Result Value Ref Range Status   Campylobacter species NOT DETECTED NOT DETECTED Final   Plesimonas shigelloides NOT DETECTED NOT DETECTED Final   Salmonella species NOT DETECTED NOT DETECTED Final   Yersinia enterocolitica NOT DETECTED NOT DETECTED Final   Vibrio species NOT DETECTED NOT DETECTED Final   Vibrio cholerae NOT DETECTED NOT DETECTED Final   Enteroaggregative E coli (EAEC) NOT DETECTED NOT DETECTED Final   Enteropathogenic E coli (EPEC) NOT DETECTED NOT DETECTED Final   Enterotoxigenic E coli (ETEC) NOT DETECTED NOT DETECTED Final   Shiga like toxin producing E coli (STEC) NOT DETECTED NOT DETECTED Final   Shigella/Enteroinvasive E coli (EIEC) NOT DETECTED NOT DETECTED Final   Cryptosporidium NOT DETECTED NOT DETECTED Final   Cyclospora cayetanensis NOT DETECTED NOT DETECTED Final   Entamoeba histolytica NOT DETECTED NOT DETECTED Final   Giardia lamblia NOT DETECTED NOT DETECTED Final   Adenovirus F40/41 NOT DETECTED NOT DETECTED Final   Astrovirus NOT DETECTED NOT DETECTED Final   Norovirus GI/GII NOT DETECTED NOT DETECTED Final   Rotavirus A NOT DETECTED NOT DETECTED Final   Sapovirus (I, II, IV, and V) NOT DETECTED NOT DETECTED Final    Comment: Performed at [facility], [address] [place], [place] [address]  C Difficile Quick Screen w PCR reflex     Status: None   Collection Time: 12/15/22 12:26 PM   Specimen: STOOL  Result Value Ref Range Status   C Diff antigen NEGATIVE NEGATIVE Final   C Diff toxin NEGATIVE NEGATIVE Final   C Diff interpretation No C. difficile detected.  Final    Comment: Performed at [facility], [address], [place], [place] [address]  Surgical pcr screen     Status: None   Collection Time: 12/18/22  9:02 AM   Specimen: Nasal Mucosa; Nasal Swab  Result Value Ref Range  Status   MRSA, PCR NEGATIVE NEGATIVE Final  Staphylococcus aureus NEGATIVE NEGATIVE Final    Comment: (NOTE) The Xpert SA Assay (FDA approved for NASAL specimens in patients 49 years of age and older), is one component of a comprehensive surveillance program. It is not intended to diagnose infection nor to guide or monitor treatment. Performed at [facility], [address], [place], [place] [address]          Radiology Studies: DG CHEST PORT 1 VIEW  Result Date: 12/21/2022 CLINICAL DATA:  Nasogastric tube placement. EXAM: PORTABLE CHEST 1 VIEW COMPARISON:  12/20/2022 FINDINGS: Endotracheal tube has been removed. LEFT-sided transvenous pacemaker leads overlie the RIGHT atrium and RIGHT ventricle. There is stable elevation of the RIGHT hemidiaphragm. Lungs are clear. IMPRESSION: Interval removal of endotracheal tube.  Lungs are clear. Electronically Signed   By: Norva Pavlov M.D.   On: 12/21/2022 09:32   DG Chest Port 1 View  Result Date: 12/20/2022 CLINICAL DATA:  Shortness of breath EXAM: PORTABLE CHEST 1 VIEW COMPARISON:  12/20/2022, 12/14/2022 FINDINGS: Esophageal tube appears retracted to the level of the upper esophagus. Left-sided cardiac pacing device as before. Elevated right diaphragm. No focal airspace disease or effusion. Right upper extremity central venous catheter tip at the SVC. Stable cardiomediastinal silhouette. IMPRESSION: Esophageal tube appears retracted to the level of the upper esophagus, repositioning is recommended. No acute airspace disease. These results will be called to the ordering clinician or representative by the Radiologist Assistant, and communication documented in the PACS or [employer]. Electronically Signed   By: Jasmine Pang M.D.   On: 12/20/2022 23:36   DG Chest Port 1 View  Result Date: 12/20/2022 CLINICAL DATA:  NG tube placement EXAM: PORTABLE CHEST 1 VIEW COMPARISON:  Abdominal radiograph 12/19/2022 FINDINGS: Enteric tube tip  within the stomach with side port in the distal esophagus. Recommend advancement approximately 4 cm. Left chest wall pacemaker. Low lung volumes. Elevated right hemidiaphragm. IMPRESSION: Enteric tube tip within the stomach with side port in the distal esophagus. Recommend advancement approximately 4 cm. Electronically Signed   By: Minerva Fester M.D.   On: 12/20/2022 18:28   DG Abd Portable 1V  Result Date: 12/19/2022 CLINICAL DATA:  [id number] Encounter for nasogastric (NG) tube placement [id number] EXAM: PORTABLE ABDOMEN - 1 VIEW COMPARISON:  None Available. FINDINGS: Nasogastric tube tip and side port overlie the proximal stomach. Nonobstructive upper abdominal bowel gas pattern. IMPRESSION: Nasogastric tube tip and side port overlie the proximal stomach, advanced from prior, could advance another 4.0 cm to ensure adequate placement. Electronically Signed   By: Caprice Renshaw M.D.   On: 12/19/2022 12:21           LOS: 6 days   Time spent= 35 mins    Barnetta Chapel, MD [facility]  If 7PM-7AM, please contact night-coverage  12/21/2022, 10:25 AM

## 2022-12-21 NOTE — Progress Notes (Signed)
CRITICAL VALUE STICKER  CRITICAL VALUE: Glucose 753, Potassium 6.1  RECEIVER (on-site recipient of call): Molli Hazard RN  DATE & TIME NOTIFIED: 5/18 2536  MESSENGER (representative from lab):  MD NOTIFIED: [place]  TIME OF NOTIFICATION: 0535  RESPONSE:  Redraw

## 2022-12-21 NOTE — Progress Notes (Signed)
ANTICOAGULATION CONSULT NOTE  Pharmacy Consult for Heparin (Eliquis on hold) Indication: atrial fibrillation  Allergies  Allergen Reactions   Lisinopril Cough   Zolpidem Other (See Comments)    "Got me in outer space"     Patient Measurements: Height: 6' 0.5" (184.2 cm) Weight: 94.7 kg (208 lb 12.4 oz) IBW/kg (Calculated) : 78.75 Heparin Dosing Weight: 90 kg  Vital Signs: Temp: 97.5 F (36.4 C) (05/18 0548) Temp Source: Oral (05/18 0548) BP: 132/86 (05/18 0548) Pulse Rate: 108 (05/18 0548)  Labs: Recent Labs    12/19/22 0900 12/19/22 0900 12/19/22 2130 12/20/22 0510 12/20/22 1518 12/21/22 0435 12/21/22 0550  HGB 9.9*  --   --  9.6*  --  14.5  --   HCT 29.6*  --   --  29.6*  --  44.6  --   PLT 223  --   --  209  --  166  --   APTT  --    < > 48* 61* 80* 73*  --   HEPARINUNFRC  --   --  0.46 0.52  --  0.53  --   CREATININE 1.01  --   --  0.80  --   --  0.88   < > = values in this interval not displayed.     Estimated Creatinine Clearance: 91.4 mL/min (by C-G formula based on SCr of 0.88 mg/dL).   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 Valentino Hue in [facility]     Depression    GERD (gastroesophageal reflux disease)    controlled with nexium    Hypertension    Pacemaker    Assessment: 72 yr old male on Eliquis 5 mg BID prior to admission for atrial fibrillation.  Eliquis stopped after 8am dose on 5/13 for possible surgery. Discussed with surgery: okay to resume heparin post-op without bolus.  aPTTs and heparin levels appear to be correlating now--will only monitor with heparin level at this time. aPTT is therapeutic at 73 seconds and heparin level is therapeutic at 0.53, on 1550 units/hr. Hgb 14.5, plts 166--stable. No signs/symptoms of bleeding reported.  Goal of Therapy:  Heparin level  0.3-0.7 units/ml aPTT 66-102 seconds Monitor platelets by anticoagulation protocol: Yes   Plan:  - Continue heparin at 1550 units/hr - Daily HL, CBC - Monitor for s/sx of bleeding daily - F/u transition back to enteral anticoag as able   Cherylin Mylar, PharmD PGY1 Pharmacy Resident 5/18/20247:52 AM

## 2022-12-21 NOTE — Progress Notes (Addendum)
3 Days Post-Op   Subjective/Chief Complaint: Pulled ng out overnight, afib, no bowel function, concerned he is going to die, wife at bedside   Objective: Vital signs in last 24 hours: Temp:  [97.5 F (36.4 C)-98.6 F (37 C)] 97.5 F (36.4 C) (05/18 0548) Pulse Rate:  [74-108] 108 (05/18 0548) Resp:  [10-20] 20 (05/18 0548) BP: (118-148)/(80-96) 132/86 (05/18 0548) SpO2:  [88 %-98 %] 98 % (05/18 0548) Weight:  [94.7 kg] 94.7 kg (05/18 0548) Last BM Date : 12/18/22  Intake/Output from previous day: 05/17 0701 - 05/18 0700 In: 0  Out: 1100 [Urine:500; Emesis/NG output:600] Intake/Output this shift: No intake/output data recorded.   Abd: Soft, mild distension, appropriately tender around incision, Midline wound with honeycomb dressing in place with dried blood otherwise cdi.     Lab Results:   Lab Results:  Recent Labs    12/20/22 0510 12/21/22 0435  WBC 6.4 3.7*  HGB 9.6* 14.5  HCT 29.6* 44.6  PLT 209 166   BMET Recent Labs    12/20/22 0510 12/21/22 0550  NA 138 138  K 3.8 4.0  CL 100 100  CO2 27 28  GLUCOSE 107* 124*  BUN 14 18  CREATININE 0.80 0.88  CALCIUM 8.5* 8.9   PT/INR No results for input(s): "LABPROT", "INR" in the last 72 hours. ABG No results for input(s): "PHART", "HCO3" in the last 72 hours.  Invalid input(s): "PCO2", "PO2"  Studies/Results: DG Chest Port 1 View  Result Date: 12/20/2022 CLINICAL DATA:  Shortness of breath EXAM: PORTABLE CHEST 1 VIEW COMPARISON:  12/20/2022, 12/14/2022 FINDINGS: Esophageal tube appears retracted to the level of the upper esophagus. Left-sided cardiac pacing device as before. Elevated right diaphragm. No focal airspace disease or effusion. Right upper extremity central venous catheter tip at the SVC. Stable cardiomediastinal silhouette. IMPRESSION: Esophageal tube appears retracted to the level of the upper esophagus, repositioning is recommended. No acute airspace disease. These results will be called to the  ordering clinician or representative by the Radiologist Assistant, and communication documented in the PACS or [employer]. Electronically Signed   By: Jasmine Pang M.D.   On: 12/20/2022 23:36   DG Chest Port 1 View  Result Date: 12/20/2022 CLINICAL DATA:  NG tube placement EXAM: PORTABLE CHEST 1 VIEW COMPARISON:  Abdominal radiograph 12/19/2022 FINDINGS: Enteric tube tip within the stomach with side port in the distal esophagus. Recommend advancement approximately 4 cm. Left chest wall pacemaker. Low lung volumes. Elevated right hemidiaphragm. IMPRESSION: Enteric tube tip within the stomach with side port in the distal esophagus. Recommend advancement approximately 4 cm. Electronically Signed   By: Minerva Fester M.D.   On: 12/20/2022 18:28   DG Abd Portable 1V  Result Date: 12/19/2022 CLINICAL DATA:  [id number] Encounter for nasogastric (NG) tube placement [id number] EXAM: PORTABLE ABDOMEN - 1 VIEW COMPARISON:  None Available. FINDINGS: Nasogastric tube tip and side port overlie the proximal stomach. Nonobstructive upper abdominal bowel gas pattern. IMPRESSION: Nasogastric tube tip and side port overlie the proximal stomach, advanced from prior, could advance another 4.0 cm to ensure adequate placement. Electronically Signed   By: Caprice Renshaw M.D.   On: 12/19/2022 12:21   DG Abd Portable 1V  Result Date: 12/19/2022 CLINICAL DATA:  Enteric tube placement EXAM: PORTABLE ABDOMEN - 1 VIEW COMPARISON:  Abdominal radiograph dated 12/18/2022 FINDINGS: Similar position of enteric tube with side hole above the GE junction. Midline surgical staples and surgical clip projecting over the right upper quadrant. Surgical chain sutures  within the right hemiabdomen. No abnormal bowel dilation. IMPRESSION: Similar position of enteric tube with side hole above the GE junction. Recommend advancement. Electronically Signed   By: Agustin Cree M.D.   On: 12/19/2022 10:35    Anti-infectives: Anti-infectives (From admission,  onward)    Start     Dose/Rate Route Frequency Ordered Stop   12/19/22 2000  piperacillin-tazobactam (ZOSYN) IVPB 3.375 g        3.375 g 12.5 mL/hr over 240 Minutes Intravenous Every 8 hours 12/19/22 1550     12/16/22 1100  piperacillin-tazobactam (ZOSYN) IVPB 3.375 g  Status:  Discontinued        3.375 g 12.5 mL/hr over 240 Minutes Intravenous Every 8 hours 12/16/22 0954 12/19/22 1550   12/16/22 0400  ampicillin-sulbactam (UNASYN) 1.5 g in sodium chloride 0.9 % 100 mL IVPB  Status:  Discontinued        1.5 g 200 mL/hr over 30 Minutes Intravenous Every 6 hours 12/16/22 0258 12/16/22 0855   12/15/22 1630  piperacillin-tazobactam (ZOSYN) IVPB 3.375 g  Status:  Discontinued        3.375 g 12.5 mL/hr over 240 Minutes Intravenous Every 8 hours 12/15/22 1528 12/16/22 0307   12/15/22 1000  azithromycin (ZITHROMAX) tablet 500 mg  Status:  Discontinued        500 mg Oral Daily 12/15/22 0900 12/16/22 0258   12/15/22 0045  piperacillin-tazobactam (ZOSYN) IVPB 3.375 g        3.375 g 12.5 mL/hr over 240 Minutes Intravenous  Once 12/15/22 0044 12/15/22 0446       Assessment/Plan: POD 3 s/p Exploratory laparotomy, lysis of adhesions, small bowel resection with primary anastomosis for Contained perforation of jejunal diverticulum by Dr. Corliss Skains on 12/18/22 - Path benign - NG removed by patient, will try without it. If he has n/v needs replaced (discussed with nurse) - Cont TPN - IV Tylenol and Robaxin while NGT in place. PRN dilaudid.  - Cont abx, plan 5d post op - Mobilize - PT/OT - Pulm toilet, IS, wean o2 as able -they would like cardiology to see them and I think reasonable   FEN - NPO, PICC/TPN VTE - SCDs, heparin gtt.  ID - Zosyn Foley - Out POD 1, voiding.     CLL (last chemo in November but on venetoclax)  Recent resp issues - bronchitis, pneumonia, RSV  A. Fib/Bradycardia - pacemaker placement about a month ago   Emelia Loron 12/21/2022

## 2022-12-22 ENCOUNTER — Inpatient Hospital Stay (HOSPITAL_COMMUNITY): Payer: Medicare PPO

## 2022-12-22 DIAGNOSIS — K529 Noninfective gastroenteritis and colitis, unspecified: Secondary | ICD-10-CM | POA: Diagnosis not present

## 2022-12-22 LAB — CBC
HCT: 26.5 % — ABNORMAL LOW (ref 39.0–52.0)
Hemoglobin: 8.5 g/dL — ABNORMAL LOW (ref 13.0–17.0)
MCH: 32.8 pg (ref 26.0–34.0)
MCHC: 32.1 g/dL (ref 30.0–36.0)
MCV: 102.3 fL — ABNORMAL HIGH (ref 80.0–100.0)
Platelets: 209 10*3/uL (ref 150–400)
RBC: 2.59 MIL/uL — ABNORMAL LOW (ref 4.22–5.81)
RDW: 12.7 % (ref 11.5–15.5)
WBC: 6.1 10*3/uL (ref 4.0–10.5)
nRBC: 0 % (ref 0.0–0.2)

## 2022-12-22 LAB — GLUCOSE, CAPILLARY
Glucose-Capillary: 139 mg/dL — ABNORMAL HIGH (ref 70–99)
Glucose-Capillary: 121 mg/dL — ABNORMAL HIGH (ref 70–99)
Glucose-Capillary: 138 mg/dL — ABNORMAL HIGH (ref 70–99)
Glucose-Capillary: 159 mg/dL — ABNORMAL HIGH (ref 70–99)

## 2022-12-22 LAB — BASIC METABOLIC PANEL
Anion gap: 9 (ref 5–15)
BUN: 22 mg/dL (ref 8–23)
CO2: 28 mmol/L (ref 22–32)
Calcium: 8.7 mg/dL — ABNORMAL LOW (ref 8.9–10.3)
Chloride: 101 mmol/L (ref 98–111)
Creatinine, Ser: 1.15 mg/dL (ref 0.61–1.24)
GFR, Estimated: >60 mL/min (ref >60)
Glucose, Bld: 152 mg/dL — ABNORMAL HIGH (ref 70–99)
Potassium: 4.2 mmol/L (ref 3.5–5.1)
Sodium: 138 mmol/L (ref 135–145)

## 2022-12-22 LAB — MAGNESIUM: Magnesium: 1.8 mg/dL (ref 1.7–2.4)

## 2022-12-22 LAB — PHOSPHORUS: Phosphorus: 1.7 mg/dL — ABNORMAL LOW (ref 2.5–4.6)

## 2022-12-22 LAB — HEPARIN LEVEL (UNFRACTIONATED)
Heparin Unfractionated: 0.74 IU/mL — ABNORMAL HIGH (ref 0.30–0.70)
Heparin Unfractionated: 0.63 IU/mL (ref 0.30–0.70)

## 2022-12-22 MED ORDER — TRAVASOL 10 % IV SOLN
INTRAVENOUS | Status: AC
  Filled 2022-12-22: qty 1254

## 2022-12-22 MED ORDER — SODIUM PHOSPHATES 45 MMOLE/15ML IV SOLN
30.0000 mmol | Freq: Once | INTRAVENOUS | Status: AC
  Administered 2022-12-22: 30 mmol via INTRAVENOUS
  Filled 2022-12-22: qty 10

## 2022-12-22 NOTE — TOC Initial Note (Signed)
Transition of Care [facility]) - Initial/Assessment Note    Patient Details  Name: Jonathon Parker MRN: [id number] Date of Birth: May 07, 1951  Transition of Care [facility]) CM/SW Contact:    Verna Czech [place], [place] Phone Number: 12/22/2022, 3:15 PM  Clinical Narrative:                 This Child psychotherapist met with patient and son at bedside. Patient's spouse available by phone. Patient resides with spouse, has a walker at home. Patient followed by PCP Dalbert Garnet, MD in [place]. Patient uses [facility]. SNF recommendations discussed, patient and spouse agreeable to SNF stay in the [place] area. First choice, [facility] in [place].   TOC to continue to follow  Naiomy Watters, LCSW Transition of Care    Expected Discharge Plan: Skilled Nursing Facility Barriers to Discharge: Continued Medical Work up   Patient Goals and CMS Choice Patient states their goals for this hospitalization and ongoing recovery are:: "To survive""          Expected Discharge Plan and Services In-house Referral: Clinical Social Work     Living arrangements for the past 2 months: Single Family Home                                      Prior Living Arrangements/Services Living arrangements for the past 2 months: Single Family Home Lives with:: Spouse   Do you feel safe going back to the place where you live?: Yes      Need for Family Participation in Patient Care: Yes (Comment) Care giver support system in place?: Yes (comment)   Criminal Activity/Legal Involvement Pertinent to Current Situation/Hospitalization: No - Comment as needed  Activities of Daily Living Home Assistive Devices/Equipment: Dan Humphreys (specify type) ADL Screening (condition at time of admission) Patient's cognitive ability adequate to safely complete daily activities?: Yes Is the patient deaf or have difficulty hearing?: No Does the patient have difficulty seeing, even when wearing glasses/contacts?: No Does  the patient have difficulty concentrating, remembering, or making decisions?: Yes Patient able to express need for assistance with ADLs?: Yes Does the patient have difficulty dressing or bathing?: Yes Independently performs ADLs?: No Dressing (OT): Needs assistance Bathing: Needs assistance Toileting: Needs assistance Walks in Home: Needs assistance Does the patient have difficulty walking or climbing stairs?: Yes Weakness of Legs: Both Weakness of Arms/Hands: Both  Permission Sought/Granted                  Emotional Assessment Appearance:: Appears stated age Attitude/Demeanor/Rapport: Engaged Affect (typically observed): Accepting, Adaptable Orientation: : Oriented to Self, Oriented to Place, Oriented to  Time, Oriented to Situation Alcohol / Substance Use: Not Applicable Psych Involvement: No (comment)  Admission diagnosis:  Enteritis [K52.9] Patient Active Problem List   Diagnosis Date Noted   Malnutrition of moderate degree 12/18/2022   Enteritis 12/15/2022   Acute ischemic VBA thalamic stroke, right (HCC) 11/24/2020   Mild intermittent asthma 01/28/2020   Dyspnea 05/11/2019   Thrombocytosis 05/11/2019   Alcohol dependence (HCC) 05/11/2019   Abnormal findings on diagnostic imaging of gall bladder 05/10/2019   Essential hypertension 04/22/2019   Post-operative complication 04/21/2019   Acute respiratory failure with hypoxia (HCC) 04/21/2019   SIRS (systemic inflammatory response syndrome) (HCC) 04/21/2019   Status post laparoscopic cholecystectomy 04/21/2019   HCAP (healthcare-associated pneumonia) 04/21/2019   CLL (chronic lymphocytic leukemia) (HCC) 04/21/2019   Calculous cholecystitis 02/28/2019  Campylobacter diarrhea    Diarrhea of infectious origin    STEC (Shiga toxin-producing Escherichia coli)    Elevated LFTs    Hypokalemia    Gastroenteritis 01/19/2017   Sepsis (HCC) 01/19/2017   GERD (gastroesophageal reflux disease) 01/19/2017   Depression  01/19/2017   PCP:  Paulina Fusi, MD Pharmacy:   [facility] [place], [place] - [address] [place] [address] Phone: [phone] Fax: [phone]  [id number] [place], [place] - [address] AT [facility] [address] [place] [address] Phone: [phone] Fax: [phone]     Social Determinants of Health (SDOH) Social History: SDOH Screenings   Food Insecurity: No Food Insecurity (12/15/2022)  Housing: Low Risk  (12/15/2022)  Transportation Needs: No Transportation Needs (12/15/2022)  Utilities: Not At Risk (12/15/2022)  Tobacco Use: Low Risk  (12/19/2022)   SDOH Interventions:     Readmission Risk Interventions     No data to display

## 2022-12-22 NOTE — Progress Notes (Signed)
PHARMACY - TOTAL PARENTERAL NUTRITION CONSULT NOTE  Indication: Small bowel diverticula perforation   Patient Measurements: Height: 6' 0.5" (184.2 cm) Weight: 93 kg (205 lb 0.4 oz) IBW/kg (Calculated) : 78.75 TPN AdjBW (KG): 95.3 Body mass index is 27.42 kg/m.  Assessment:  77 YOM presented to [facility] on 5/11 with fevers, abdominal pain and confusion.  PMH significant for CLL, and colon cancer post L hemicolectomy.  Recent history of pacemaker placement for Afib/brady and enteritis.  CT showed small bowel diverticulitis vs enteritis vs SBO with ischemia.  Patient was on a CLD on 5/13 and began having increased abdominal pain.  Surgery plans to perform an ex-lap with SBR.  Patient reports eating two meals a day and intake was reduced to ~50% given his abdominal pain that started months ago.  Patient feels that he lost weight but is unsure of how much.  Glucose / Insulin: no hx DM - CBGs < 180 - 3 units SSI Electrolytes: Na 138, K 4.2, Ca 8.7 [CoCa 9.46] , Mag 1.8, Phos 1.7 Renal: SCr 1.15 (increase - monitor), BUN WNL Hepatic: LFTs WNL, tbili normalized, albumin 2.8 Intake / Output; MIVF: UOP 0.4 ml/kg/hr, LBM 5/15, NG 0mL GI Imaging: none since TPN initiation GI Surgeries / Procedures:  5/15 ex-lap with LoA, SBR with primary anastomosis  Central access: PICC placed 12/17/22 TPN start date: 12/18/22  Nutritional Goals: RD Estimated Needs Total Energy Estimated Needs: 2400-2600 Total Protein Estimated Needs: 120-135g Total Fluid Estimated Needs: >/=2L  Current Nutrition:  TPN  Plan:  Continue TPN at 95 mL/hr at 1800 (goal rate 95 ml/hr) Electrolytes in TPN: Na 75 mEq/L, decrease K 35 mEq/L, Ca 5 mEq/L, Mg 5 mEq/L, Increase Phos 22 mmol/L, Cl:Ac 1:1 Add standard MVI and trace elements to TPN Continue moderate SSI Q6H Thiamine 100mg  IV daily x 5 days (ends 5/19) NaPhos 30 mmol x 1  Standard TPN labs on Mon and Thurs  Jeanella Cara, PharmD, [facility] Clinical Pharmacist Please  see AMION for all Pharmacists' Contact Phone Numbers 12/22/2022, 8:40 AM

## 2022-12-22 NOTE — Progress Notes (Signed)
Pt already sleeping w/stable VS. Sleep study remains recommended for home unit if necessary. Our CPAP unit available @bedside .

## 2022-12-22 NOTE — Progress Notes (Signed)
ANTICOAGULATION CONSULT NOTE  Pharmacy Consult for Heparin (Eliquis on hold) Indication: atrial fibrillation  Allergies  Allergen Reactions   Lisinopril Cough   Zolpidem Other (See Comments)    "Got me in outer space"     Patient Measurements: Height: 6' 0.5" (184.2 cm) Weight: 93 kg (205 lb 0.4 oz) IBW/kg (Calculated) : 78.75 Heparin Dosing Weight: 90 kg  Vital Signs: Temp: 99.1 F (37.3 C) (05/19 0342) Temp Source: Oral (05/19 0342) BP: 143/79 (05/19 0342) Pulse Rate: 107 (05/19 0342)  Labs: Recent Labs    12/20/22 0510 12/20/22 1518 12/21/22 0435 12/21/22 0550 12/22/22 0540  HGB 9.6*  --  14.5  --  8.5*  HCT 29.6*  --  44.6  --  26.5*  PLT 209  --  166  --  209  APTT 61* 80* 73*  --   --   HEPARINUNFRC 0.52  --  0.53  --  0.74*  CREATININE 0.80  --   --  0.88 1.15     Estimated Creatinine Clearance: 64.7 mL/min (by C-G formula based on SCr of 1.15 mg/dL).  Assessment: 72 yr old male on Eliquis 5 mg BID prior to admission for atrial fibrillation.  Eliquis stopped after 8am dose on 5/13 for possible surgery. Discussed with surgery: okay to resume heparin post-op without bolus.  Heparin level is above goal at 0.74 on 1550 units/hr. No bleeding noted, Hgb down to 8.5 (previous 14.5 error?), platelets are normal. No infusion issues or bleeding per RN.  Goal of Therapy:  Heparin level 0.3-0.7 units/ml Monitor platelets by anticoagulation protocol: Yes   Plan:  - Decrease heparin drip to 1450 units/hr - 8h heparin level - Daily heparin level, CBC - Monitor for s/sx of bleeding daily - F/u transition back to enteral anticoag as able  Thank you for involving pharmacy in this patient's care.  Loura Back, PharmD, BCPS Clinical Pharmacist 12/22/2022 6:39 AM

## 2022-12-22 NOTE — Progress Notes (Signed)
4 Days Post-Op   Subjective/Chief Complaint: No flatus or bm, no n/v, doesn't feel well   Objective: Vital signs in last 24 hours: Temp:  [97.7 F (36.5 C)-99.1 F (37.3 C)] 99.1 F (37.3 C) (05/19 0342) Pulse Rate:  [91-107] 107 (05/19 0342) Resp:  [18-20] 19 (05/19 0342) BP: (123-143)/(79-91) 143/79 (05/19 0342) SpO2:  [95 %-99 %] 95 % (05/19 0342) Weight:  [93 kg] 93 kg (05/19 0342) Last BM Date : 12/18/22  Intake/Output from previous day: 05/18 0701 - 05/19 0700 In: 120 [P.O.:120] Out: 1250 [Urine:1250] Intake/Output this shift: No intake/output data recorded.  Ab soft less distended today, tender around incision, I removed honeycomb due to old blood and incision is without infection  Lab Results:  Recent Labs    12/21/22 0435 12/22/22 0540  WBC 3.7* 6.1  HGB 14.5 8.5*  HCT 44.6 26.5*  PLT 166 209   BMET Recent Labs    12/21/22 0550 12/22/22 0540  NA 138 138  K 4.0 4.2  CL 100 101  CO2 28 28  GLUCOSE 124* 152*  BUN 18 22  CREATININE 0.88 1.15  CALCIUM 8.9 8.7*   PT/INR No results for input(s): "LABPROT", "INR" in the last 72 hours. ABG No results for input(s): "PHART", "HCO3" in the last 72 hours.  Invalid input(s): "PCO2", "PO2"  Studies/Results: DG CHEST PORT 1 VIEW  Result Date: 12/21/2022 CLINICAL DATA:  Nasogastric tube placement. EXAM: PORTABLE CHEST 1 VIEW COMPARISON:  12/20/2022 FINDINGS: Endotracheal tube has been removed. LEFT-sided transvenous pacemaker leads overlie the RIGHT atrium and RIGHT ventricle. There is stable elevation of the RIGHT hemidiaphragm. Lungs are clear. IMPRESSION: Interval removal of endotracheal tube.  Lungs are clear. Electronically Signed   By: Norva Pavlov M.D.   On: 12/21/2022 09:32   DG Chest Port 1 View  Result Date: 12/20/2022 CLINICAL DATA:  Shortness of breath EXAM: PORTABLE CHEST 1 VIEW COMPARISON:  12/20/2022, 12/14/2022 FINDINGS: Esophageal tube appears retracted to the level of the upper  esophagus. Left-sided cardiac pacing device as before. Elevated right diaphragm. No focal airspace disease or effusion. Right upper extremity central venous catheter tip at the SVC. Stable cardiomediastinal silhouette. IMPRESSION: Esophageal tube appears retracted to the level of the upper esophagus, repositioning is recommended. No acute airspace disease. These results will be called to the ordering clinician or representative by the Radiologist Assistant, and communication documented in the PACS or [employer]. Electronically Signed   By: Jasmine Pang M.D.   On: 12/20/2022 23:36   DG Chest Port 1 View  Result Date: 12/20/2022 CLINICAL DATA:  NG tube placement EXAM: PORTABLE CHEST 1 VIEW COMPARISON:  Abdominal radiograph 12/19/2022 FINDINGS: Enteric tube tip within the stomach with side port in the distal esophagus. Recommend advancement approximately 4 cm. Left chest wall pacemaker. Low lung volumes. Elevated right hemidiaphragm. IMPRESSION: Enteric tube tip within the stomach with side port in the distal esophagus. Recommend advancement approximately 4 cm. Electronically Signed   By: Minerva Fester M.D.   On: 12/20/2022 18:28    Anti-infectives: Anti-infectives (From admission, onward)    Start     Dose/Rate Route Frequency Ordered Stop   12/19/22 2000  piperacillin-tazobactam (ZOSYN) IVPB 3.375 g        3.375 g 12.5 mL/hr over 240 Minutes Intravenous Every 8 hours 12/19/22 1550     12/16/22 1100  piperacillin-tazobactam (ZOSYN) IVPB 3.375 g  Status:  Discontinued        3.375 g 12.5 mL/hr over 240 Minutes Intravenous  Every 8 hours 12/16/22 0954 12/19/22 1550   12/16/22 0400  ampicillin-sulbactam (UNASYN) 1.5 g in sodium chloride 0.9 % 100 mL IVPB  Status:  Discontinued        1.5 g 200 mL/hr over 30 Minutes Intravenous Every 6 hours 12/16/22 0258 12/16/22 0855   12/15/22 1630  piperacillin-tazobactam (ZOSYN) IVPB 3.375 g  Status:  Discontinued        3.375 g 12.5 mL/hr over 240  Minutes Intravenous Every 8 hours 12/15/22 1528 12/16/22 0307   12/15/22 1000  azithromycin (ZITHROMAX) tablet 500 mg  Status:  Discontinued        500 mg Oral Daily 12/15/22 0900 12/16/22 0258   12/15/22 0045  piperacillin-tazobactam (ZOSYN) IVPB 3.375 g        3.375 g 12.5 mL/hr over 240 Minutes Intravenous  Once 12/15/22 0044 12/15/22 0446       Assessment/Plan: POD 4 s/p Exploratory laparotomy, lysis of adhesions, small bowel resection with primary anastomosis for Contained perforation of jejunal diverticulum by Dr. Corliss Skains on 12/18/22 - Path benign - NG removed by patient, will try without it. If he has n/v needs replaced (discussed with nurse) - Cont TPN - IV Tylenol and Robaxin while NGT in place. PRN dilaudid.  - Cont abx, plan 5d post op - Mobilize - PT/OT - Pulm toilet, IS, wean o2 as able -they would like cardiology to see them and I think reasonable   FEN - NPO, PICC/TPN VTE - SCDs, heparin gtt.  ID - Zosyn Foley - Out POD 1, voiding.     CLL (last chemo in November but on venetoclax)  Recent resp issues - bronchitis, pneumonia, RSV  A. Fib/Bradycardia - pacemaker placement about a month ago   Jonathon Parker 12/22/2022

## 2022-12-22 NOTE — Progress Notes (Signed)
ANTICOAGULATION CONSULT NOTE  Pharmacy Consult for Heparin (Eliquis on hold) Indication: atrial fibrillation  Allergies  Allergen Reactions   Lisinopril Cough   Zolpidem Other (See Comments)    "Got me in outer space"     Patient Measurements: Height: 6' 0.5" (184.2 cm) Weight: 93 kg (205 lb 0.4 oz) IBW/kg (Calculated) : 78.75 Heparin Dosing Weight: 90 kg  Vital Signs: Temp: 99.1 F (37.3 C) (05/19 1132) Temp Source: Oral (05/19 1132) BP: 126/82 (05/19 1132) Pulse Rate: 92 (05/19 1132)  Labs: Recent Labs    12/20/22 0510 12/20/22 1518 12/21/22 0435 12/21/22 0550 12/22/22 0540 12/22/22 1454  HGB 9.6*  --  14.5  --  8.5*  --   HCT 29.6*  --  44.6  --  26.5*  --   PLT 209  --  166  --  209  --   APTT 61* 80* 73*  --   --   --   HEPARINUNFRC 0.52  --  0.53  --  0.74* 0.63  CREATININE 0.80  --   --  0.88 1.15  --     Estimated Creatinine Clearance: 64.7 mL/min (by C-G formula based on SCr of 1.15 mg/dL).  Assessment: 72 yr old male on Eliquis 5 mg BID prior to admission for atrial fibrillation.  Eliquis stopped after 8am dose on 5/13 for possible surgery. Discussed with surgery: okay to resume heparin post-op without bolus.  Heparin level is at goal at 0.63 on 1450 units/hr. No bleeding noted, Hgb at 8.5 (previous 14.5 error?), platelets are normal. No infusion issues or bleeding per RN.  Goal of Therapy:  Heparin level 0.3-0.7 units/ml Monitor platelets by anticoagulation protocol: Yes   Plan:  Continue heparin infusion at 1450 units/hr Check heparin level in 8 hours and daily while on heparin Continue to monitor H&H and platelets   Thank you for allowing pharmacy to be a part of this patient's care.  Thelma Barge, PharmD Clinical Pharmacist

## 2022-12-22 NOTE — Progress Notes (Signed)
PROGRESS NOTE    Sevan Finseth [facility]  ZOX:[id number] DOB: 29-Sep-1950 DOA: 12/14/2022 PCP: Paulina Fusi, MD   Brief Narrative:   72 y.o. male past medical history significant of CLL, Paroxysmal A. Fib. on Eliquis, history of colon cancer status post left hemicolectomy more than 10 years ago, GERD who was recently seen at an outside facility [facility] about 10 days prior to admission and diagnosed with sepsis 2/2 enteritis for which he was discharged on oral antibiotics.  The patient was brought in by family with worsening abdominal pain and confusion.  Associated with watery stools.     Workup revealed findings most consistent with perforated diverticulitis of the small bowel in the right upper quadrant, no abscess formation or free fluid at this time.  The patient is currently on Zosyn.  General surgery following.  Possible exploratory surgery during this admission.  Stool study was negative for C. difficile.  Eliquis held, heparin drip started on 12/17/2022.  Patient underwent exploratory laparotomy, lysis of adhesion and small bowel resection with primary anastomosis by general surgery on 5/15.  Postoperative slow recovery.  12/22/2022: Patient seen alongside patient's wife.  Surgery team is directing postop course.  No new complaints today.  Patient seems to have tolerated oral tablets today.  No bowel movements reported.   Assessment & Plan:  Principal Problem:   Enteritis Active Problems:   Sepsis (HCC)   Depression   CLL (chronic lymphocytic leukemia) (HCC)   Malnutrition of moderate degree    Perforated diverticulitis of the small bowel, POA Status post exploratory laparotomy, lysis of adhesion and small bowel resection with primary anastomosis on 5/15 Routine postop management by general surgery.  On IV Zosyn, advance diet as tolerated. Currently getting TPN.  Mobilize. 12/22/2022: Stable.  Surgical team is directing care.   Paroxysmal A-fib on  Eliquis Anticoagulation resumed.  Intermittent atrial fibrillation with RVR with ambulation.  Will optimize electrolytes.  Will slowly resume p.o. medications once cleared by general surgery in the meantime IV as needed Lopressor   Resolved acute metabolic encephalopathy: CT was negative for any acute findings. Reorient as needed 12/22/2022: Resolved.   Depression: Continue Wellbutrin   Dementia without behavioral disturbances: Continue home aricept, seroquel   CLL (chronic lymphocytic leukemia) (HCC) No acute issues related to CLL Follow-up with hematology oncology   Macrocytic anemia: Noted follow-up with PCP as an outpatient.   Hx CVA Aspirin, Lipitor  Outpatient recently diagnosed OSA - CPAP ordered but patient refusing  Intermittently p.o. medications will be held until his medical condition allows  DVT prophylaxis: Heparin drip Code Status: Full code Family Communication: Spouse at bedside Status is: Inpatient Ongoing postop management.  Awaiting bowel function to return      Diet Orders (From admission, onward)     Start     Ordered   12/18/22 1441  Diet NPO time specified Except for: Ice Chips  Diet effective midnight       Comments: 1 cup of ice chips per day  Question:  Except for  Answer:  Ice Chips   12/18/22 1440            Subjective: Patient seen. No new complaints.    Examination: Constitutional: Not in acute distress Respiratory: Clear to auscultation Cardiovascular: S1-S2. Abdomen: Soft and nontender.   Musculoskeletal: No edema noted Neurologic:  Patient is awake and alert.     Objective: Vitals:   12/21/22 2308 12/22/22 0342 12/22/22 0902 12/22/22 1132  BP: (!) 141/82 Marland Kitchen)  143/79 (!) 141/81 126/82  Pulse: 100 (!) 107 (!) 104 92  Resp: 18 19 14 20   Temp: 98.6 F (37 C) 99.1 F (37.3 C) 98.5 F (36.9 C) 99.1 F (37.3 C)  TempSrc: Oral Oral Oral Oral  SpO2: 95% 95% 97% 98%  Weight:  93 kg    Height:        Intake/Output  Summary (Last 24 hours) at 12/22/2022 1334 Last data filed at 12/22/2022 0700 Gross per 24 hour  Intake --  Output 450 ml  Net -450 ml    Filed Weights   12/20/22 0530 12/21/22 0548 12/22/22 0342  Weight: 91.4 kg 94.7 kg 93 kg    Scheduled Meds:  aspirin EC  81 mg Oral Daily   atorvastatin  20 mg Oral Daily   buPROPion  300 mg Oral Daily   Chlorhexidine Gluconate Cloth  6 each Topical Daily   colestipol  1 g Oral Daily   donepezil  5 mg Oral Daily   insulin aspart  0-9 Units Subcutaneous Q6H   melatonin  3 mg Oral QHS   scopolamine  1 patch Transdermal Q72H   sertraline  25 mg Oral Daily   sodium chloride flush  10-40 mL Intracatheter Q12H   thiamine (VITAMIN B1) injection  100 mg Intravenous Daily   traZODone  100 mg Oral QHS   Continuous Infusions:  sodium chloride 10 mL/hr at 12/19/22 2022   diltiazem (CARDIZEM) infusion 10 mg/hr (12/22/22 0506)   heparin 1,450 Units/hr (12/22/22 0645)   methocarbamol (ROBAXIN) IV 1,000 mg (12/22/22 1234)   piperacillin-tazobactam (ZOSYN)  IV 3.375 g (12/22/22 1306)   sodium phosphate 30 mmol in dextrose 5 % 250 mL infusion 30 mmol (12/22/22 1113)   TPN ADULT (ION) 95 mL/hr at 12/21/22 1731   TPN ADULT (ION)      Nutritional status Signs/Symptoms: moderate fat depletion, moderate muscle depletion, mild muscle depletion, energy intake < or equal to 75% for > or equal to 1 month Interventions: TPN, Refer to RD note for recommendations Body mass index is 27.42 kg/m.  Data Reviewed:   CBC: Recent Labs  Lab 12/18/22 0330 12/19/22 0900 12/20/22 0510 12/21/22 0435 12/22/22 0540  WBC 4.4 8.5 6.4 3.7* 6.1  HGB 10.4* 9.9* 9.6* 14.5 8.5*  HCT 30.7* 29.6* 29.6* 44.6 26.5*  MCV 98.7 100.7* 102.4* 102.1* 102.3*  PLT 188 223 209 166 209    Basic Metabolic Panel: Recent Labs  Lab 12/18/22 0330 12/19/22 0900 12/20/22 0510 12/21/22 0550 12/22/22 0540  NA 136 135 138 138 138  K 3.9 4.3 3.8 4.0 4.2  CL 99 101 100 100 101  CO2 25  24 27 28 28   GLUCOSE 84 137* 107* 124* 152*  BUN 8 19 14 18 22   CREATININE 1.06 1.01 0.80 0.88 1.15  CALCIUM 8.7* 8.7* 8.5* 8.9 8.7*  MG 2.2 2.2 2.1 2.0 1.8  PHOS 4.3 2.5 2.4* 3.6 1.7*    GFR: Estimated Creatinine Clearance: 64.7 mL/min (by C-G formula based on SCr of 1.15 mg/dL). Liver Function Tests: Recent Labs  Lab 12/16/22 0047 12/18/22 0330 12/19/22 0900  AST 12* 14* 21  ALT 15 16 19   ALKPHOS 80 145* 115  BILITOT 1.3* 0.9 1.0  PROT 4.8* 6.0* 6.1*  ALBUMIN 2.5* 2.7* 2.8*    No results for input(s): "LIPASE", "AMYLASE" in the last 168 hours.  No results for input(s): "AMMONIA" in the last 168 hours. Coagulation Profile: No results for input(s): "INR", "PROTIME" in the last 168  hours. Cardiac Enzymes: No results for input(s): "CKTOTAL", "CKMB", "CKMBINDEX", "TROPONINI" in the last 168 hours. BNP (last 3 results) No results for input(s): "PROBNP" in the last 8760 hours. HbA1C: No results for input(s): "HGBA1C" in the last 72 hours. CBG: Recent Labs  Lab 12/21/22 1150 12/21/22 1817 12/21/22 2300 12/22/22 0553 12/22/22 1132  GLUCAP 134* 119* 151* 139* 121*    Lipid Profile: No results for input(s): "CHOL", "HDL", "LDLCALC", "TRIG", "CHOLHDL", "LDLDIRECT" in the last 72 hours.  Thyroid Function Tests: No results for input(s): "TSH", "T4TOTAL", "FREET4", "T3FREE", "THYROIDAB" in the last 72 hours. Anemia Panel: No results for input(s): "VITAMINB12", "FOLATE", "FERRITIN", "TIBC", "IRON", "RETICCTPCT" in the last 72 hours. Sepsis Labs: Recent Labs  Lab 12/20/22 2300  PROCALCITON 0.20     Recent Results (from the past 240 hour(s))  Culture, blood (routine x 2)     Status: None   Collection Time: 12/14/22 10:24 PM   Specimen: BLOOD RIGHT ARM  Result Value Ref Range Status   Specimen Description   Final    BLOOD RIGHT ARM Performed at [facility], [address], [place], [place] [address]    Special Requests   Final    BOTTLES DRAWN AEROBIC AND  ANAEROBIC Blood Culture adequate volume Performed at [facility], [address] [place], [place] [address]    Culture   Final    NO GROWTH 5 DAYS Performed at [facility], [address], [place], [place] [address]    Report Status 12/20/2022 FINAL  Final  Gastrointestinal Panel by PCR , Stool     Status: None   Collection Time: 12/15/22 12:26 PM   Specimen: STOOL  Result Value Ref Range Status   Campylobacter species NOT DETECTED NOT DETECTED Final   Plesimonas shigelloides NOT DETECTED NOT DETECTED Final   Salmonella species NOT DETECTED NOT DETECTED Final   Yersinia enterocolitica NOT DETECTED NOT DETECTED Final   Vibrio species NOT DETECTED NOT DETECTED Final   Vibrio cholerae NOT DETECTED NOT DETECTED Final   Enteroaggregative E coli (EAEC) NOT DETECTED NOT DETECTED Final   Enteropathogenic E coli (EPEC) NOT DETECTED NOT DETECTED Final   Enterotoxigenic E coli (ETEC) NOT DETECTED NOT DETECTED Final   Shiga like toxin producing E coli (STEC) NOT DETECTED NOT DETECTED Final   Shigella/Enteroinvasive E coli (EIEC) NOT DETECTED NOT DETECTED Final   Cryptosporidium NOT DETECTED NOT DETECTED Final   Cyclospora cayetanensis NOT DETECTED NOT DETECTED Final   Entamoeba histolytica NOT DETECTED NOT DETECTED Final   Giardia lamblia NOT DETECTED NOT DETECTED Final   Adenovirus F40/41 NOT DETECTED NOT DETECTED Final   Astrovirus NOT DETECTED NOT DETECTED Final   Norovirus GI/GII NOT DETECTED NOT DETECTED Final   Rotavirus A NOT DETECTED NOT DETECTED Final   Sapovirus (I, II, IV, and V) NOT DETECTED NOT DETECTED Final    Comment: Performed at [facility], [address] [place], [place] [address]  C Difficile Quick Screen w PCR reflex     Status: None   Collection Time: 12/15/22 12:26 PM   Specimen: STOOL  Result Value Ref Range Status   C Diff antigen NEGATIVE NEGATIVE Final   C Diff toxin NEGATIVE NEGATIVE Final   C Diff interpretation No C.  difficile detected.  Final    Comment: Performed at [facility], [address], [place], [place] [address]  Surgical pcr screen     Status: None   Collection Time: 12/18/22  9:02  AM   Specimen: Nasal Mucosa; Nasal Swab  Result Value Ref Range Status   MRSA, PCR NEGATIVE NEGATIVE Final   Staphylococcus aureus NEGATIVE NEGATIVE Final    Comment: (NOTE) The Xpert SA Assay (FDA approved for NASAL specimens in patients 69 years of age and older), is one component of a comprehensive surveillance program. It is not intended to diagnose infection nor to guide or monitor treatment. Performed at [facility], [address] [place], [place] [address]          Radiology Studies: DG Abd Portable 1V  Result Date: 12/22/2022 CLINICAL DATA:  [id number] Ileus following gastrointestinal surgery [facility]) [id number] EXAM: PORTABLE ABDOMEN - 1 VIEW COMPARISON:  12/19/2022 FINDINGS: The nasogastric tube is been removed. Stomach is incompletely distended. Midline skin staples. Small bowel relatively decompressed. Normal gastric distribution throughout the colon without dilatation. Surgical clips in the pelvis. No abnormal abdominal calcifications. Regional bones unremarkable. IMPRESSION: Nonobstructive bowel gas pattern. Electronically Signed   By: Corlis Leak M.D.   On: 12/22/2022 11:17   DG CHEST PORT 1 VIEW  Result Date: 12/21/2022 CLINICAL DATA:  Nasogastric tube placement. EXAM: PORTABLE CHEST 1 VIEW COMPARISON:  12/20/2022 FINDINGS: Endotracheal tube has been removed. LEFT-sided transvenous pacemaker leads overlie the RIGHT atrium and RIGHT ventricle. There is stable elevation of the RIGHT hemidiaphragm. Lungs are clear. IMPRESSION: Interval removal of endotracheal tube.  Lungs are clear. Electronically Signed   By: Norva Pavlov M.D.   On: 12/21/2022 09:32   DG Chest Port 1 View  Result Date: 12/20/2022 CLINICAL DATA:  Shortness of breath EXAM: PORTABLE CHEST 1 VIEW COMPARISON:  12/20/2022,  12/14/2022 FINDINGS: Esophageal tube appears retracted to the level of the upper esophagus. Left-sided cardiac pacing device as before. Elevated right diaphragm. No focal airspace disease or effusion. Right upper extremity central venous catheter tip at the SVC. Stable cardiomediastinal silhouette. IMPRESSION: Esophageal tube appears retracted to the level of the upper esophagus, repositioning is recommended. No acute airspace disease. These results will be called to the ordering clinician or representative by the Radiologist Assistant, and communication documented in the PACS or [employer]. Electronically Signed   By: Jasmine Pang M.D.   On: 12/20/2022 23:36   DG Chest Port 1 View  Result Date: 12/20/2022 CLINICAL DATA:  NG tube placement EXAM: PORTABLE CHEST 1 VIEW COMPARISON:  Abdominal radiograph 12/19/2022 FINDINGS: Enteric tube tip within the stomach with side port in the distal esophagus. Recommend advancement approximately 4 cm. Left chest wall pacemaker. Low lung volumes. Elevated right hemidiaphragm. IMPRESSION: Enteric tube tip within the stomach with side port in the distal esophagus. Recommend advancement approximately 4 cm. Electronically Signed   By: Minerva Fester M.D.   On: 12/20/2022 18:28           LOS: 7 days   Time spent= 35 mins    Barnetta Chapel, MD [facility]  If 7PM-7AM, please contact night-coverage  12/22/2022, 1:34 PM

## 2022-12-22 NOTE — NC FL2 (Signed)
[place] MEDICAID FL2 LEVEL OF CARE FORM     IDENTIFICATION  Patient Name: Jonathon Parker Birthdate: [birth year] Sex: male Admission Date (Current Location): 12/14/2022  [facility] and [place] Number:  Producer, television/film/video and Address:  [facility], [address] [place], [place] [address]      Provider Number:    Attending Physician Name and Address:  Barnetta Chapel, MD  Relative Name and Phone Number:  Thrun,Victoria (Spouse) [phone] (Mobile)    Current Level of Care: SNF Recommended Level of Care: Skilled Nursing Facility Prior Approval Number:    Date Approved/Denied:   PASRR Number: pending  Discharge Plan: SNF    Current Diagnoses: Patient Active Problem List   Diagnosis Date Noted   Malnutrition of moderate degree 12/18/2022   Enteritis 12/15/2022   Acute ischemic VBA thalamic stroke, right (HCC) 11/24/2020   Mild intermittent asthma 01/28/2020   Dyspnea 05/11/2019   Thrombocytosis 05/11/2019   Alcohol dependence (HCC) 05/11/2019   Abnormal findings on diagnostic imaging of gall bladder 05/10/2019   Essential hypertension 04/22/2019   Post-operative complication 04/21/2019   Acute respiratory failure with hypoxia (HCC) 04/21/2019   SIRS (systemic inflammatory response syndrome) (HCC) 04/21/2019   Status post laparoscopic cholecystectomy 04/21/2019   HCAP (healthcare-associated pneumonia) 04/21/2019   CLL (chronic lymphocytic leukemia) (HCC) 04/21/2019   Calculous cholecystitis 02/28/2019   Campylobacter diarrhea    Diarrhea of infectious origin    STEC (Shiga toxin-producing Escherichia coli)    Elevated LFTs    Hypokalemia    Gastroenteritis 01/19/2017   Sepsis (HCC) 01/19/2017   GERD (gastroesophageal reflux disease) 01/19/2017   Depression 01/19/2017    Orientation RESPIRATION BLADDER Height & Weight     Self, Time, Situation, Place  Normal Continent Weight: 205 lb 0.4 oz (93 kg) Height:  6' 0.5"  (184.2 cm)  BEHAVIORAL SYMPTOMS/MOOD NEUROLOGICAL BOWEL NUTRITION STATUS      Continent Diet  AMBULATORY STATUS COMMUNICATION OF NEEDS Skin   Extensive Assist Verbally Normal                       Personal Care Assistance Level of Assistance  Bathing, Feeding, Dressing Bathing Assistance: Maximum assistance Feeding assistance: Limited assistance Dressing Assistance: Maximum assistance     Functional Limitations Info  Sight, Hearing, Speech Sight Info: Adequate Hearing Info: Adequate Speech Info: Adequate    SPECIAL CARE FACTORS FREQUENCY  Blood pressure, PT (By licensed PT), OT (By licensed OT)     PT Frequency: 5x per week OT Frequency: 5x per week            Contractures Contractures Info: Not present    Additional Factors Info  Code Status, Allergies Code Status Info: full code Allergies Info: Lisinopril- Cough   Zolpidem- Not Specified           Current Medications (12/22/2022):  This is the current hospital active medication list Current Facility-Administered Medications  Medication Dose Route Frequency Provider Last Rate Last Admin   0.9 %  sodium chloride infusion   Intravenous PRN Dimple Nanas, MD 10 mL/hr at 12/19/22 2022 Infusion Verify at 12/19/22 2022   albuterol (PROVENTIL) (2.5 MG/3ML) 0.083% nebulizer solution 2.5 mL  2.5 mL Inhalation Q6H PRN Eric Form, PA-C       aspirin EC tablet 81 mg  81 mg Oral Daily Eric Form, PA-C   81 mg at 12/22/22 0906   atorvastatin (LIPITOR) tablet 20 mg  20 mg Oral Daily Eric Form, PA-C   20 mg at 12/22/22 4696   buPROPion (WELLBUTRIN XL) 24 hr tablet 300 mg  300 mg Oral Daily Eric Form, PA-C   300 mg at 12/22/22 2952   Chlorhexidine Gluconate Cloth 2 % PADS 6 each  6 each Topical Daily Eric Form, PA-C   6 each at 12/22/22 1000   colestipol (COLESTID) tablet 1 g  1 g Oral Daily Eric Form, PA-C   1 g at 12/18/22 0848   diltiazem (CARDIZEM) 125 mg in dextrose 5%  125 mL (1 mg/mL) infusion  5-15 mg/hr Intravenous Continuous Howerter, Justin B, DO 10 mL/hr at 12/22/22 0506 10 mg/hr at 12/22/22 0506   donepezil (ARICEPT) tablet 5 mg  5 mg Oral Daily Eric Form, PA-C   5 mg at 12/22/22 8413   heparin ADULT infusion 100 units/mL (25000 units/250mL)  1,450 Units/hr Intravenous Continuous Arihan, Vandecar, RPH 14.5 mL/hr at 12/22/22 0645 1,450 Units/hr at 12/22/22 0645   HYDROmorphone (DILAUDID) injection 0.5-1 mg  0.5-1 mg Intravenous Q2H PRN Jacinto Halim, PA-C   1 mg at 12/22/22 1453   insulin aspart (novoLOG) injection 0-9 Units  0-9 Units Subcutaneous Q6H Eric Form, PA-C   1 Units at 12/22/22 1215   melatonin tablet 3 mg  3 mg Oral QHS Eric Form, PA-C   3 mg at 12/17/22 2022   methocarbamol (ROBAXIN) 1,000 mg in dextrose 5 % 100 mL IVPB  1,000 mg Intravenous Q8H Jacinto Halim, PA-C 220 mL/hr at 12/22/22 1234 1,000 mg at 12/22/22 1234   metoprolol tartrate (LOPRESSOR) injection 5 mg  5 mg Intravenous Q2H PRN Dimple Nanas, MD   5 mg at 12/20/22 1548   ondansetron (ZOFRAN) tablet 4 mg  4 mg Oral Q6H PRN Eric Form, PA-C       Or   ondansetron [facility]) injection 4 mg  4 mg Intravenous Q6H PRN Eric Form, PA-C   4 mg at 12/19/22 1137   piperacillin-tazobactam (ZOSYN) IVPB 3.375 g  3.375 g Intravenous Q8H Calton Dach I, RPH 12.5 mL/hr at 12/22/22 1306 3.375 g at 12/22/22 1306   QUEtiapine (SEROQUEL) tablet 25 mg  25 mg Oral QHS PRN Eric Form, PA-C       scopolamine (TRANSDERM-SCOP) 1 MG/3DAYS 1.5 mg  1 patch Transdermal Q72H Howerter, Justin B, DO   1.5 mg at 12/20/22 2227   sertraline (ZOLOFT) tablet 25 mg  25 mg Oral Daily Eric Form, PA-C   25 mg at 12/22/22 2440   sodium chloride flush (NS) 0.9 % injection 10-40 mL  10-40 mL Intracatheter Q12H Eric Form, PA-C   10 mL at 12/22/22 1115   sodium chloride flush (NS) 0.9 % injection 10-40 mL  10-40 mL Intracatheter PRN Carl Best  H, PA-C       sodium phosphate 30 mmol in dextrose 5 % 250 mL infusion  30 mmol Intravenous Once Berton Mount I, MD 43 mL/hr at 12/22/22 1113 30 mmol at 12/22/22 1113   thiamine (VITAMIN B1) injection 100 mg  100 mg Intravenous Daily Eric Form, PA-C   100 mg at 12/22/22 1027   TPN ADULT (ION)   Intravenous Continuous TPN Berton Mount I, MD 95 mL/hr at 12/21/22 1731 New Bag at 12/21/22 1731   TPN ADULT (ION)   Intravenous Continuous TPN Barnetta Chapel, MD       traZODone (DESYREL) tablet 100  mg  100 mg Oral QHS Eric Form, PA-C   100 mg at 12/17/22 2022     Discharge Medications: Please see discharge summary for a list of discharge medications.  Relevant Imaging Results:  Relevant Lab Results:   Additional Information SS# [id number]  Verna Czech [place], [place]

## 2022-12-23 DIAGNOSIS — K529 Noninfective gastroenteritis and colitis, unspecified: Secondary | ICD-10-CM | POA: Diagnosis not present

## 2022-12-23 DIAGNOSIS — I4819 Other persistent atrial fibrillation: Secondary | ICD-10-CM | POA: Diagnosis not present

## 2022-12-23 LAB — CBC
HCT: 23.9 % — ABNORMAL LOW (ref 39.0–52.0)
Hemoglobin: 7.6 g/dL — ABNORMAL LOW (ref 13.0–17.0)
MCH: 32.9 pg (ref 26.0–34.0)
MCHC: 31.8 g/dL (ref 30.0–36.0)
MCV: 103.5 fL — ABNORMAL HIGH (ref 80.0–100.0)
Platelets: 191 10*3/uL (ref 150–400)
RBC: 2.31 MIL/uL — ABNORMAL LOW (ref 4.22–5.81)
RDW: 12.7 % (ref 11.5–15.5)
WBC: 4.9 10*3/uL (ref 4.0–10.5)
nRBC: 0 % (ref 0.0–0.2)

## 2022-12-23 LAB — COMPREHENSIVE METABOLIC PANEL
ALT: 42 U/L (ref 0–44)
AST: 35 U/L (ref 15–41)
Albumin: 2.4 g/dL — ABNORMAL LOW (ref 3.5–5.0)
Alkaline Phosphatase: 163 U/L — ABNORMAL HIGH (ref 38–126)
Anion gap: 7 (ref 5–15)
BUN: 23 mg/dL (ref 8–23)
CO2: 27 mmol/L (ref 22–32)
Calcium: 8.6 mg/dL — ABNORMAL LOW (ref 8.9–10.3)
Chloride: 101 mmol/L (ref 98–111)
Creatinine, Ser: 0.73 mg/dL (ref 0.61–1.24)
GFR, Estimated: >60 mL/min (ref >60)
Glucose, Bld: 176 mg/dL — ABNORMAL HIGH (ref 70–99)
Potassium: 3.9 mmol/L (ref 3.5–5.1)
Sodium: 135 mmol/L (ref 135–145)
Total Bilirubin: 0.8 mg/dL (ref 0.3–1.2)
Total Protein: 5.7 g/dL — ABNORMAL LOW (ref 6.5–8.1)

## 2022-12-23 LAB — MAGNESIUM: Magnesium: 2 mg/dL (ref 1.7–2.4)

## 2022-12-23 LAB — GLUCOSE, CAPILLARY
Glucose-Capillary: 179 mg/dL — ABNORMAL HIGH (ref 70–99)
Glucose-Capillary: 147 mg/dL — ABNORMAL HIGH (ref 70–99)

## 2022-12-23 LAB — HEPARIN LEVEL (UNFRACTIONATED): Heparin Unfractionated: 0.58 IU/mL (ref 0.30–0.70)

## 2022-12-23 LAB — APTT

## 2022-12-23 LAB — TRIGLYCERIDES: Triglycerides: 116 mg/dL (ref <150)

## 2022-12-23 LAB — HEMOGLOBIN: Hemoglobin: 8.2 g/dL — ABNORMAL LOW (ref 13.0–17.0)

## 2022-12-23 LAB — PHOSPHORUS: Phosphorus: 2.8 mg/dL (ref 2.5–4.6)

## 2022-12-23 MED ORDER — TRAVASOL 10 % IV SOLN
INTRAVENOUS | Status: AC
  Filled 2022-12-23: qty 1254

## 2022-12-23 MED ORDER — POTASSIUM CHLORIDE 10 MEQ/50ML IV SOLN
10.0000 meq | INTRAVENOUS | Status: AC
  Administered 2022-12-23 (×2): 10 meq via INTRAVENOUS
  Filled 2022-12-23 (×2): qty 50

## 2022-12-23 NOTE — Progress Notes (Signed)
ANTICOAGULATION CONSULT NOTE  Pharmacy Consult for Heparin (Eliquis on hold) Indication: atrial fibrillation  Allergies  Allergen Reactions   Lisinopril Cough   Zolpidem Other (See Comments)    "Got me in outer space"     Patient Measurements: Height: 6' 0.5" (184.2 cm) Weight: 93.8 kg (206 lb 12.7 oz) IBW/kg (Calculated) : 78.75 Heparin Dosing Weight: 90 kg  Vital Signs: Temp: 98.1 F (36.7 C) (05/20 0745) Temp Source: Oral (05/20 0745) BP: 126/72 (05/20 0745) Pulse Rate: 76 (05/20 0745)  Labs: Recent Labs    12/20/22 1518 12/21/22 0435 12/21/22 0435 12/21/22 0550 12/22/22 0540 12/22/22 1454 12/23/22 0507  HGB  --  14.5   < >  --  8.5*  --  7.6*  HCT  --  44.6  --   --  26.5*  --  23.9*  PLT  --  166  --   --  209  --  191  APTT 80* 73*  --   --   --   --   --   HEPARINUNFRC  --  0.53   < >  --  0.74* 0.63 0.58  CREATININE  --   --   --  0.88 1.15  --  0.73   < > = values in this interval not displayed.     Estimated Creatinine Clearance: 93 mL/min (by C-G formula based on SCr of 0.73 mg/dL).  Assessment: 72 yr old male on Eliquis 5 mg BID prior to admission for atrial fibrillation.  Eliquis stopped after 8am dose on 5/13 for possible surgery. Discussed with surgery: okay to resume heparin post-op without bolus.  Heparin continues to be therapeutic. Pt is POD5 for bowel surgery. Plan for 5d zosyn post op. D9 of total abx.  Hgb down to 7.6, plt wnl Wbc wnl, AF  Goal of Therapy:  Heparin level 0.3-0.7 units/ml Monitor platelets by anticoagulation protocol: Yes   Plan:  Continue heparin infusion at 1450 units/hr Check heparin level daily while on heparin Continue to monitor H&H and platelets Zosyn to end today  Ulyses Southward, PharmD, BCIDP, AAHIVP, CPP Infectious Disease Pharmacist 12/23/2022 7:57 AM

## 2022-12-23 NOTE — Care Plan (Signed)
Patient had irregular heart beats. RN alerted Dr. Wilfred Curtis [facility]. No new orders at this time

## 2022-12-23 NOTE — Progress Notes (Signed)
PHARMACY - TOTAL PARENTERAL NUTRITION CONSULT NOTE  Indication: Small bowel diverticula perforation   Patient Measurements: Height: 6' 0.5" (184.2 cm) Weight: 93.8 kg (206 lb 12.7 oz) IBW/kg (Calculated) : 78.75 TPN AdjBW (KG): 95.3 Body mass index is 27.66 kg/m.  Assessment:  [name] presented to [facility] on 5/11 with fevers, abdominal pain and confusion.  PMH significant for CLL, and colon cancer post L hemicolectomy.  Recent history of pacemaker placement for Afib/brady and enteritis.  CT showed small bowel diverticulitis vs enteritis vs SBO with ischemia.  Patient was on a CLD on 5/13 and began having increased abdominal pain.  Surgery plans to perform an ex-lap with SBR.  Patient reports eating two meals a day and intake was reduced to ~50% given his abdominal pain that started months ago.  Patient feels that he lost weight but is unsure of how much.  Glucose / Insulin: no hx DM - CBGs < 180 - 4 units SSI Electrolytes: Na 135, K 3.9, Ca 8.6 [CoCa 9.88] , Mag 2.0, Phos 2.8 Renal: SCr 0.73, BUN WNL Hepatic: AST/ALT WNL, Alk Phos 163, tbili normalized, albumin 2.4 Intake / Output; MIVF: UOP 0.56 ml/kg/hr, LBM 5/15, NG 0mL GI Imaging: 5/19 portable X-ray shows nonobstructive bowel gas pattern GI Surgeries / Procedures:  5/15 ex-lap with LoA, SBR with primary anastomosis  Central access: PICC placed 12/17/22 TPN start date: 12/18/22  Nutritional Goals: RD Estimated Needs Total Energy Estimated Needs: 2400-2600 Total Protein Estimated Needs: 120-135g Total Fluid Estimated Needs: >/=2L  Current Nutrition:  TPN  Plan:  Continue TPN at 95 mL/hr at 1800 (goal rate 95 ml/hr) Electrolytes in TPN: Na 85 mEq/L, K 38 mEq/L, Ca 5 mEq/L, Mg 5 mEq/L, Phos 22 mmol/L, Cl:Ac 1:1 Add standard MVI and trace elements to TPN Continue moderate SSI Q6H K run (10 meq) iv x2 Standard TPN labs on Mon and Thurs  Jonathon Parker BS, PharmD, BCPS Clinical Pharmacist 12/23/2022 9:12 AM  Contact:  [phone] after 3 PM  "Be curious, not judgmental..." -Debbora Dus

## 2022-12-23 NOTE — Consult Note (Addendum)
Cardiology Consultation   Patient ID: Jonathon Parker [facility] MRN: [id number]; DOB: 06/25/1951  Admit date: 12/14/2022 Date of Consult: 12/23/2022  PCP:  Paulina Fusi, MD   [facility] Providers Cardiologist:  None        Patient Profile:   Madix Parker is a 72 y.o. 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 Ashok Pall.  History of Present Illness:   Mr. Kolber presented to the [facility] emergency department on 5/12 with symptoms of any abdominal pain and confusion.  Earlier this month, patient was seen at a hospital in [facility] and diagnosed with enteritis, started on antibiotics.  It appears that blood cultures were drawn during this outside hospital visit, the results are not available.  This hospitalization was complicated by atrial fibrillation with RVR and patient was placed on IV diltiazem with conversion to sinus rhythm.  Patient was returned to home metoprolol dosing at time of discharge.  About 3 days prior to this current admission, patient developed confusion and began reporting worsening abdominal pain with watery stools.  In the emergency department, patient with evidence of acute infection, leukocytosis.  He was placed on empiric Zosyn.  CT abdomen pelvis showed "marked inflammatory changes of several loops of right mid abdominal small bowel and mesentery."  Given recent enteritis diagnosis and CT findings, there was suspicion of perforated diverticulitis of the small bowel.  Stool study negative for C. difficile.  Patient had home Eliquis held and underwent exploratory laparotomy with lysis of adhesion and small bowel resection with primary  anastomosis by general surgery on 5/15.  Following surgery, patient with intermittent atrial fibrillation and rapid ventricular response.  He is still unable to tolerate oral medications and is currently being managed with IV diltiazem.  Regarding prior cardiac care, patient is followed by [facility].  His last office visit was on 5/8, this was a 1 month follow-up after he had a DC PPM placed for tachycardia-bradycardia syndrome.  He was at this visit the patient's metoprolol dose was uptitrated to 37.5 twice daily for better rate control. Patient says Metoprolol was then increased again to 50mg  BID just prior to admission.  Patient seen with spouse at bedside. He is moderately confused on exam thought still able to contribute to HPI. Both patient and spouse report elevation of HR with minimal movement since surgery. Patient has baseline dyspnea but does have awareness of this episodic tachycardia. HR does slow down once patient is no longer moving about.   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 Valentino Hue in [facility]     Depression    GERD (gastroesophageal reflux disease)    controlled with nexium    Hypertension    Pacemaker     Past Surgical History:  Procedure Laterality Date   APPENDECTOMY     CARPAL TUNNEL RELEASE     left    CHOLECYSTECTOMY N/A 04/20/2019   Procedure: LAPAROSCOPIC CHOLECYSTECTOMY;  Surgeon: [name], MD;  Location: [facility] ORS;  Service: General;  Laterality:  N/A;   COLON SURGERY  2002   10 inches of colon taken out    COLONOSCOPY     ENDOSCOPIC RETROGRADE CHOLANGIOPANCREATOGRAPHY (ERCP) WITH PROPOFOL N/A 03/01/2019   Procedure: ENDOSCOPIC RETROGRADE CHOLANGIOPANCREATOGRAPHY (ERCP) WITH PROPOFOL;  Surgeon: Meridee Score Netty Starring., MD;  Location: [facility] ENDOSCOPY;  Service: Gastroenterology;   Laterality: N/A;   ERCP  03/01/2019   HERNIA REPAIR     bilateral inguinal    IR CHOLANGIOGRAM EXISTING TUBE  03/25/2019   IR PERC CHOLECYSTOSTOMY  03/02/2019   LAPAROTOMY N/A 12/18/2022   Procedure: EXPLORATORY LAPAROTOMY, BOWEL RESECTION;  Surgeon: [name], MD;  Location: [facility] OR;  Service: General;  Laterality: N/A;   left heel reconstruction  01/2002   17 screws and 2 plates    PACEMAKER INSERTION     POLYPECTOMY     REMOVAL OF STONES  03/01/2019   Procedure: REMOVAL OF STONES;  Surgeon: [name]., MD;  Location[facility] ENDOSCOPY;  Service: Gastroenterology;;   [name]  03/01/2019   Procedure: [name];  Surgeon: [name]., MD;  Location: [facility] ENDOSCOPY;  Service: Gastroenterology;;   tooth extracted     with laughing gas    UPPER GASTROINTESTINAL ENDOSCOPY       Home Medications:  Prior to Admission medications   Medication Sig Start Date End Date Taking? Authorizing Provider  acetaminophen (TYLENOL) 500 MG tablet Take 1,000 mg by mouth every 6 (six) hours as needed for moderate pain or mild pain.   Yes [provider]  albuterol (VENTOLIN HFA) 108 (90 Base) MCG/ACT inhaler Inhale 2 puffs into the lungs every 6 (six) hours as needed for wheezing or shortness of breath. 11/26/20  Yes Mathews Argyle, NP  amLODipine (NORVASC) 10 MG tablet Take 1 tablet (10 mg total) by mouth daily. Patient taking differently: Take 5 mg by mouth as needed (BP 130 or >). 11/26/20  Yes Mathews Argyle, NP  apixaban (ELIQUIS) 5 MG TABS tablet Take 5 mg by mouth 2 (two) times daily. 11/01/22  Yes [provider]  aspirin EC 81 MG EC tablet Take 1 tablet (81 mg total) by mouth daily. Swallow whole. 11/27/20  Yes Mathews Argyle, NP  atorvastatin (LIPITOR) 20 MG tablet Take 20 mg by mouth daily. 03/22/21  Yes [provider]  buPROPion (WELLBUTRIN XL) 300 MG 24 hr tablet Take 1 tablet (300 mg total) by mouth daily. 11/26/20  Yes Mathews Argyle,  NP  cholecalciferol (VITAMIN D3) 25 MCG (1000 UNIT) tablet Take 2 tablets (2,000 Units total) by mouth daily. Takes 2 tablets (1,000 units each) for a total of 2,000 units daily Patient taking differently: Take 2,000 Units by mouth daily. 11/26/20  Yes Mathews Argyle, NP  colestipol (COLESTID) 1 g tablet Take 1 g by mouth every evening. 11/20/21  Yes [provider]  diphenoxylate-atropine (LOMOTIL) 2.5-0.025 MG tablet Take 1 tablet by mouth in the morning and at bedtime. 11/02/21  Yes [provider]  docusate sodium (COLACE) 100 MG capsule Take 1 capsule (100 mg total) by mouth 2 (two) times daily. Patient taking differently: Take 100 mg by mouth 2 (two) times daily as needed for mild constipation or moderate constipation. 11/26/20  Yes Mathews Argyle, NP  donepezil (ARICEPT) 5 MG tablet Take 1 tablet (5 mg total) by mouth daily. Patient taking differently: Take 5 mg by mouth at bedtime. 12/07/21  Yes Van Clines, MD  metoprolol tartrate (LOPRESSOR) 50 MG tablet Take 50 mg by mouth 2 (  two) times daily. 12/12/22  Yes [provider]  ondansetron (ZOFRAN) 8 MG tablet Take 8 mg by mouth every 8 (eight) hours as needed for nausea or vomiting. 10/31/21  Yes [provider]  pantoprazole (PROTONIX) 40 MG tablet Take 40 mg by mouth daily. 10/31/21  Yes [provider]  potassium chloride (KLOR-CON) 10 MEQ tablet Take 20 mEq by mouth daily. 11/15/21  Yes [provider]  sertraline (ZOLOFT) 25 MG tablet Take 25 mg by mouth at bedtime. 02/27/21  Yes [provider]  sucralfate (CARAFATE) 1 g tablet Take 1 g by mouth daily as needed (stomach discomfort). 12/02/22  Yes [provider]  traZODone (DESYREL) 100 MG tablet Take 1 tablet (100 mg total) by mouth at bedtime. 11/26/20  Yes Mathews Argyle, NP  budesonide (PULMICORT) 0.5 MG/2ML nebulizer solution Inhale into the lungs. Patient not taking: Reported on 12/15/2022 03/25/22   [provider]  budesonide-formoterol (SYMBICORT) 80-4.5 MCG/ACT inhaler Inhale 2 puffs into the lungs in the morning and at bedtime. Patient not taking: Reported on 12/15/2022 11/26/20   Mathews Argyle, NP  cefdinir (OMNICEF) 300 MG capsule Take by mouth. Patient not taking: Reported on 12/15/2022 12/01/22   [provider]  VENETOCLAX PO Take 300 mg by mouth daily. Patient not taking: Reported on 12/15/2022    [provider]    Inpatient Medications: Scheduled Meds:  atorvastatin  20 mg Oral Daily   buPROPion  300 mg Oral Daily   Chlorhexidine Gluconate Cloth  6 each Topical Daily   colestipol  1 g Oral Daily   donepezil  5 mg Oral Daily   insulin aspart  0-9 Units Subcutaneous Q6H   melatonin  3 mg Oral QHS   scopolamine  1 patch Transdermal Q72H   sertraline  25 mg Oral Daily   sodium chloride flush  10-40 mL Intracatheter Q12H   traZODone  100 mg Oral QHS   Continuous Infusions:  sodium chloride 10 mL/hr at 12/19/22 2022   diltiazem (CARDIZEM) infusion 10 mg/hr (12/23/22 0658)   heparin 1,450 Units/hr (12/23/22 0525)   methocarbamol (ROBAXIN) IV 1,000 mg (12/23/22 1428)   piperacillin-tazobactam (ZOSYN)  IV 3.375 g (12/23/22 1200)   TPN ADULT (ION) 95 mL/hr at 12/22/22 1719   TPN ADULT (ION)     PRN Meds: sodium chloride, albuterol, HYDROmorphone (DILAUDID) injection, metoprolol tartrate, ondansetron **OR** ondansetron (ZOFRAN) IV, QUEtiapine, sodium chloride flush  Allergies:    Allergies  Allergen Reactions   Lisinopril Cough   Zolpidem Other (See Comments)    "Got me in outer space"     Social History:   Social History   Socioeconomic History   Marital status: Married    Spouse name: Not on file   Number of children: Not on file   Years of education: Not on file   Highest education level: Not on file  Occupational History   Not on file  Tobacco Use   Smoking status: Never   Smokeless tobacco: Never  Vaping Use   Vaping Use: Never used   Substance and Sexual Activity   Alcohol use: Yes    Comment: 1 drink before supper   Drug use: No   Sexual activity: Yes  Other Topics Concern   Not on file  Social History Narrative   Right handed    Lives with wife   Drinks caffeine    Two story home   Social Determinants of Health   Financial Resource Strain: Not  on file  Food Insecurity: No Food Insecurity (12/15/2022)   Hunger Vital Sign    Worried About Running Out of Food in the Last Year: Never true    Ran Out of Food in the Last Year: Never true  Transportation Needs: No Transportation Needs (12/15/2022)   PRAPARE - Administrator, Civil Service (Medical): No    Lack of Transportation (Non-Medical): No  Physical Activity: Not on file  Stress: Not on file  Social Connections: Not on file  Intimate Partner Violence: Not At Risk (12/15/2022)   Humiliation, Afraid, Rape, and Kick questionnaire    Fear of Current or Ex-Partner: No    Emotionally Abused: No    Physically Abused: No    Sexually Abused: No    Family History:    Family History  Problem Relation Age of Onset   Prostate cancer Brother    Colon cancer Neg Hx    Esophageal cancer Neg Hx    Colon polyps Neg Hx    Rectal cancer Neg Hx    Stomach cancer Neg Hx      ROS:  Please see the history of present illness.   All other ROS reviewed and negative.     Physical Exam/Data:   Vitals:   12/23/22 0745 12/23/22 1051 12/23/22 1200 12/23/22 1600  BP: 126/72 123/87 123/80 132/71  Pulse: 76 79  75  Resp: 16 15 (!) 21 17  Temp: 98.1 F (36.7 C) 98 F (36.7 C) 98.3 F (36.8 C) 98.3 F (36.8 C)  TempSrc: Oral Oral Oral Oral  SpO2: 100% 98%  99%  Weight:      Height:        Intake/Output Summary (Last 24 hours) at 12/23/2022 1658 Last data filed at 12/23/2022 0800 Gross per 24 hour  Intake 3046.38 ml  Output 1250 ml  Net 1796.38 ml      12/23/2022    4:00 AM 12/22/2022    3:42 AM 12/21/2022    5:48 AM  Last 3 Weights  Weight (lbs)  206 lb 12.7 oz 205 lb 0.4 oz 208 lb 12.4 oz  Weight (kg) 93.8 kg 93 kg 94.7 kg     Body mass index is 27.66 kg/m.  General:  Well nourished/developed but pale and chronically ill appearing. HEENT: normal Neck: no JVD Vascular: No carotid bruits; Distal pulses 2+ bilaterally Cardiac:  normal S1, S2; irregularly irregular; no murmur  Lungs:  clear to auscultation bilaterally, no wheezing, rhonchi or rales  Abd: soft, nontender, no hepatomegaly  Ext: no edema Musculoskeletal:  No deformities, BUE and BLE strength normal and equal Skin: warm and dry  Neuro:  CNs 2-12 intact, no focal abnormalities noted Psych: Mild confusion/disorientation  EKG:  The EKG was personally reviewed and demonstrates:  atrial flutter with ventricular rate ~90s. Telemetry:  Telemetry was personally reviewed and demonstrates:  atrial flutter with variable conduction. Most rapid rates appear to be 2:1 flutter. Occasional paced beats seen.   Relevant CV Studies: Preserved LVEF: 50 to 55%, moderate RV dilation (11/01/2022)  Negative NM stress test for ischemia or infarction (02/08/2022)   Laboratory Data:  High Sensitivity Troponin:  No results for input(s): "TROPONINIHS" in the last 720 hours.   Chemistry Recent Labs  Lab 12/21/22 0550 12/22/22 0540 12/23/22 0507  NA 138 138 135  K 4.0 4.2 3.9  CL 100 101 101  CO2 28 28 27   GLUCOSE 124* 152* 176*  BUN 18 22 23   CREATININE 0.88 1.15 0.73  CALCIUM 8.9 8.7* 8.6*  MG 2.0 1.8 2.0  GFRNONAA >60 >60 >60  ANIONGAP 10 9 7     Recent Labs  Lab 12/18/22 0330 12/19/22 0900 12/23/22 0507  PROT 6.0* 6.1* 5.7*  ALBUMIN 2.7* 2.8* 2.4*  AST 14* 21 35  ALT 16 19 42  ALKPHOS 145* 115 163*  BILITOT 0.9 1.0 0.8   Lipids  Recent Labs  Lab 12/23/22 0507  TRIG 116    Hematology Recent Labs  Lab 12/21/22 0435 12/22/22 0540 12/23/22 0507 12/23/22 1229  WBC QUESTIONABLE RESULTS, RECOMMEND RECOLLECT TO VERIFY 6.1 4.9  --   RBC QUESTIONABLE RESULTS,  RECOMMEND RECOLLECT TO VERIFY 2.59* 2.31*  --   HGB QUESTIONABLE RESULTS, RECOMMEND RECOLLECT TO VERIFY 8.5* 7.6* 8.2*  HCT QUESTIONABLE RESULTS, RECOMMEND RECOLLECT TO VERIFY 26.5* 23.9*  --   MCV QUESTIONABLE RESULTS, RECOMMEND RECOLLECT TO VERIFY 102.3* 103.5*  --   MCH QUESTIONABLE RESULTS, RECOMMEND RECOLLECT TO VERIFY 32.8 32.9  --   MCHC QUESTIONABLE RESULTS, RECOMMEND RECOLLECT TO VERIFY 32.1 31.8  --   RDW QUESTIONABLE RESULTS, RECOMMEND RECOLLECT TO VERIFY 12.7 12.7  --   PLT QUESTIONABLE RESULTS, RECOMMEND RECOLLECT TO VERIFY 209 191  --    Thyroid No results for input(s): "TSH", "FREET4" in the last 168 hours.  BNP Recent Labs  Lab 12/20/22 2300  BNP 336.7*    DDimer No results for input(s): "DDIMER" in the last 168 hours.   Radiology/Studies:  DG Abd Portable 1V  Result Date: 12/22/2022 CLINICAL DATA:  [id number] Ileus following gastrointestinal surgery (HCC) [id number] EXAM: PORTABLE ABDOMEN - 1 VIEW COMPARISON:  12/19/2022 FINDINGS: The nasogastric tube is been removed. Stomach is incompletely distended. Midline skin staples. Small bowel relatively decompressed. Normal gastric distribution throughout the colon without dilatation. Surgical clips in the pelvis. No abnormal abdominal calcifications. Regional bones unremarkable. IMPRESSION: Nonobstructive bowel gas pattern. Electronically Signed   By: Corlis Leak M.D.   On: 12/22/2022 11:17   DG CHEST PORT 1 VIEW  Result Date: 12/21/2022 CLINICAL DATA:  Nasogastric tube placement. EXAM: PORTABLE CHEST 1 VIEW COMPARISON:  12/20/2022 FINDINGS: Endotracheal tube has been removed. LEFT-sided transvenous pacemaker leads overlie the RIGHT atrium and RIGHT ventricle. There is stable elevation of the RIGHT hemidiaphragm. Lungs are clear. IMPRESSION: Interval removal of endotracheal tube.  Lungs are clear. Electronically Signed   By: Norva Pavlov M.D.   On: 12/21/2022 09:32   DG Chest Port 1 View  Result Date: 12/20/2022 CLINICAL DATA:   Shortness of breath EXAM: PORTABLE CHEST 1 VIEW COMPARISON:  12/20/2022, 12/14/2022 FINDINGS: Esophageal tube appears retracted to the level of the upper esophagus. Left-sided cardiac pacing device as before. Elevated right diaphragm. No focal airspace disease or effusion. Right upper extremity central venous catheter tip at the SVC. Stable cardiomediastinal silhouette. IMPRESSION: Esophageal tube appears retracted to the level of the upper esophagus, repositioning is recommended. No acute airspace disease. These results will be called to the ordering clinician or representative by the Radiologist Assistant, and communication documented in the PACS or [employer]. Electronically Signed   By: Jasmine Pang M.D.   On: 12/20/2022 23:36   DG Chest Port 1 View  Result Date: 12/20/2022 CLINICAL DATA:  NG tube placement EXAM: PORTABLE CHEST 1 VIEW COMPARISON:  Abdominal radiograph 12/19/2022 FINDINGS: Enteric tube tip within the stomach with side port in the distal esophagus. Recommend advancement approximately 4 cm. Left chest wall pacemaker. Low lung volumes. Elevated right hemidiaphragm. IMPRESSION: Enteric tube tip within the stomach with side  port in the distal esophagus. Recommend advancement approximately 4 cm. Electronically Signed   By: Minerva Fester M.D.   On: 12/20/2022 18:28     Assessment and Plan:   Paroxysmal atrial fibrillation with RVR (initial diagnosis March 2024) Tachycardia-bradycardia syndrome s/p Medtronic DC PPM with conduction system pacing (11/12/22)  Cardiology asked to see patient today due to intermittent RVR in post-operative setting. Review of telemetry/interview of patient seems to indicate that increased rates correlate strongly with physical exertion/attempts to move. Telemetry shows atrial flutter with variable conduction, most rapid rate 2:1. PVCs appear to primarily be paced beats.   Suspect patient's RVR with exertion is largely physiologic 2/2 surgical stress and  acute anemia. Would consider transfusion to address anemia.  If patient's PO status allows oral medications, would resume Metoprolol Tartrate 50mg  BID with up-titration as able. If unable to take PO, would continue current plan of IV diltiazem and PRN lopressor pushes (though less ideal). Continue IV heparin with transition back to Eliquis when deemed safe by surgical team.  Long-term rhythm control strategy may need to be considered when patient is no longer acutely ill. He is a poor candidate for Amiodarone given concern for restrictive lung disease (followed by outpatient pulmonology).   Hypertension  Patient's BP mildly fluctuant this admission. Would focus on rate control as above and modify HTN regimen as needed to support increased doses of Metoprolol.   Per primary team:  Perforated diverticulitis of small bowel POD 5 s/p ex lap with lysis of adhesion and small bowel resection with primary anastomosis Acute anemia Delirium/dementia Depression CLL   Risk Assessment/Risk Scores:          CHA2DS2-VASc Score = 4   This indicates a 4.8% annual risk of stroke. The patient's score is based upon: CHF History: 0 HTN History: 1 Diabetes History: 0 Stroke History: 2 Vascular Disease History: 0 Age Score: 1 Gender Score: 0         For questions or updates, please contact [facility] Please consult [email or web address] for contact info under    Signed, Perlie Gold, PA-C  12/23/2022 4:58 PM   Agree with note by Perlie Gold, PA-C.  We are asked to see this chronically ill-appearing 72 year old Caucasian male status post recent exploratory lap and bowel resection for A-fib with variable ventricular response status post permanent transvenous pacemaker insertion in [place] on April 9.  He is currently on IV heparin and diltiazem.  It is unclear whether he is taking oral medications yet but when he is reliably able to he should be on p.o. beta-blocker.  His hemoglobin is  in the low 8 range and ultimately should be greater than 10.  His exam is benign.  He does have bowel sounds and his abdomen is soft.  He has intermittently paced rhythm.  His EF is normal by echo performed 11/01/2022.  Once he is able to take oral medications we will titrate his beta-blocker for rate control.  Likely he has a pacemaker that will limit his slow heart rate.  Will continue to follow with you.  Runell Gess, M.D., FACP, [facility], [facility] [address] [place], [place]  [address]  [phone] 12/23/2022 5:39 PM

## 2022-12-23 NOTE — Progress Notes (Signed)
PROGRESS NOTE    Jonathon Parker  UUV:[id number] DOB: [birth year] DOA: 12/14/2022 PCP: Paulina Fusi, MD   Brief Narrative:   72 y.o. male past medical history significant of CLL, Paroxysmal A. Fib. on Eliquis, history of colon cancer status post left hemicolectomy more than 10 years ago, GERD who was recently seen at an outside facility [facility]) about 10 days prior to admission and diagnosed with sepsis 2/2 enteritis for which he was discharged on oral antibiotics.  The patient was brought in by family with worsening abdominal pain and confusion.  Associated with watery stools.     Workup revealed findings most consistent with perforated diverticulitis of the small bowel in the right upper quadrant, no abscess formation or free fluid at this time.  The patient is currently on Zosyn.  General surgery following.  Possible exploratory surgery during this admission.  Stool study was negative for C. difficile.  Eliquis held, heparin drip started on 12/17/2022.  Patient underwent exploratory laparotomy, lysis of adhesion and small bowel resection with primary anastomosis by general surgery on 5/15.  Postoperative slow recovery.  12/22/2022: Patient seen alongside patient's wife.  Surgery team is directing postop course.  No new complaints today.  Patient seems to have tolerated oral tablets today.  No bowel movements reported.   Assessment & Plan:  Principal Problem:   Enteritis Active Problems:   Sepsis (HCC)   Depression   CLL (chronic lymphocytic leukemia) (HCC)   Malnutrition of moderate degree    Perforated diverticulitis of the small bowel, POA Status post exploratory laparotomy, lysis of adhesion and small bowel resection with primary anastomosis on 5/15 Routine postop management by general surgery.  On IV Zosyn, advance diet as tolerated. Had flatus this morning. Ng tube discontinued by patient Currently getting TPN.  Mobilize. Gen surg advising 5 days post-op  abx so should be able to stop after today   Paroxysmal A-fib on Eliquis Tachy-brady syndrome s/p PPM placement 11/2022 Anticoagulation resumed.  Intermittent atrial fibrillation with RVR with ambulation.  Will optimize electrolytes.  Will slowly resume p.o. medications once cleared by general surgery in the meantime IV diltiazem. Hr has been difficult to control with frequent rvr, runs of v tach. - will consult cardiology today for assistance in mgmt  Acute blood loss anemia on chronic macrocytic anemia Baseline hgb ~12 has trended to 7.6 post-op. No stool yet. Hgb has trended down the last couple of days. - will repeat hgb, may need repeat imaging if hgb continues its down-trend  Delirium CT was negative for any acute findings. With underlying dementia. Reorient as needed   Depression: Continue Wellbutrin   Dementia without behavioral disturbances: Continue home aricept, seroquel   CLL (chronic lymphocytic leukemia) (HCC) No acute issues related to CLL Follow-up with hematology oncology   Hx CVA Cont lipitor - will d/c aspirin now that patient is anticoagulated   Outpatient recently diagnosed OSA - CPAP ordered but patient refusing  Intermittently p.o. medications will be held until his medical condition allows  DVT prophylaxis: Heparin drip Code Status: Full code Family Communication: Spouse updated at bedside 5/20 Status is: Inpatient Ongoing postop management.  Awaiting bowel function to return      Diet Orders (From admission, onward)     Start     Ordered   12/18/22 1441  Diet NPO time specified Except for: Ice Chips  Diet effective midnight       Comments: 1 cup of ice chips per day  Question:  Except for  Answer:  Ice Chips   12/18/22 1440            Subjective: Patient seen. confused  Examination: Constitutional: ill-appearing Respiratory: Clear to auscultation Cardiovascular: S1-S2. Abdomen: distended, hyperactive bowel sounds, incision  c/d/i Musculoskeletal: No edema noted Neurologic:  confused, moving all 4, tremor   Objective: Vitals:   12/21/22 2308 12/22/22 0342 12/22/22 0902 12/22/22 1132  BP: (!) 141/82 (!) 143/79 (!) 141/81 126/82  Pulse: 100 (!) 107 (!) 104 92  Resp: 18 19 14 20   Temp: 98.6 F (37 C) 99.1 F (37.3 C) 98.5 F (36.9 C) 99.1 F (37.3 C)  TempSrc: Oral Oral Oral Oral  SpO2: 95% 95% 97% 98%  Weight:  93 kg    Height:        Intake/Output Summary (Last 24 hours) at 12/22/2022 1334 Last data filed at 12/22/2022 0700 Gross per 24 hour  Intake --  Output 450 ml  Net -450 ml    Filed Weights   12/20/22 0530 12/21/22 0548 12/22/22 0342  Weight: 91.4 kg 94.7 kg 93 kg    Scheduled Meds:  aspirin EC  81 mg Oral Daily   atorvastatin  20 mg Oral Daily   buPROPion  300 mg Oral Daily   Chlorhexidine Gluconate Cloth  6 each Topical Daily   colestipol  1 g Oral Daily   donepezil  5 mg Oral Daily   insulin aspart  0-9 Units Subcutaneous Q6H   melatonin  3 mg Oral QHS   scopolamine  1 patch Transdermal Q72H   sertraline  25 mg Oral Daily   sodium chloride flush  10-40 mL Intracatheter Q12H   thiamine (VITAMIN B1) injection  100 mg Intravenous Daily   traZODone  100 mg Oral QHS   Continuous Infusions:  sodium chloride 10 mL/hr at 12/19/22 2022   diltiazem (CARDIZEM) infusion 10 mg/hr (12/22/22 0506)   heparin 1,450 Units/hr (12/22/22 0645)   methocarbamol (ROBAXIN) IV 1,000 mg (12/22/22 1234)   piperacillin-tazobactam (ZOSYN)  IV 3.375 g (12/22/22 1306)   sodium phosphate 30 mmol in dextrose 5 % 250 mL infusion 30 mmol (12/22/22 1113)   TPN ADULT (ION) 95 mL/hr at 12/21/22 1731   TPN ADULT (ION)      Nutritional status Signs/Symptoms: moderate fat depletion, moderate muscle depletion, mild muscle depletion, energy intake < or equal to 75% for > or equal to 1 month Interventions: TPN, Refer to RD note for recommendations Body mass index is 27.42 kg/m.  Data Reviewed:    CBC: Recent Labs  Lab 12/18/22 0330 12/19/22 0900 12/20/22 0510 12/21/22 0435 12/22/22 0540  WBC 4.4 8.5 6.4 3.7* 6.1  HGB 10.4* 9.9* 9.6* 14.5 8.5*  HCT 30.7* 29.6* 29.6* 44.6 26.5*  MCV 98.7 100.7* 102.4* 102.1* 102.3*  PLT 188 223 209 166 209    Basic Metabolic Panel: Recent Labs  Lab 12/18/22 0330 12/19/22 0900 12/20/22 0510 12/21/22 0550 12/22/22 0540  NA 136 135 138 138 138  K 3.9 4.3 3.8 4.0 4.2  CL 99 101 100 100 101  CO2 25 24 27 28 28   GLUCOSE 84 137* 107* 124* 152*  BUN 8 19 14 18 22   CREATININE 1.06 1.01 0.80 0.88 1.15  CALCIUM 8.7* 8.7* 8.5* 8.9 8.7*  MG 2.2 2.2 2.1 2.0 1.8  PHOS 4.3 2.5 2.4* 3.6 1.7*    GFR: Estimated Creatinine Clearance: 64.7 mL/min (by C-G formula based on SCr of 1.15 mg/dL). Liver Function Tests: Recent Labs  Lab  12/16/22 0047 12/18/22 0330 12/19/22 0900  AST 12* 14* 21  ALT 15 16 19   ALKPHOS 80 145* 115  BILITOT 1.3* 0.9 1.0  PROT 4.8* 6.0* 6.1*  ALBUMIN 2.5* 2.7* 2.8*    No results for input(s): "LIPASE", "AMYLASE" in the last 168 hours.  No results for input(s): "AMMONIA" in the last 168 hours. Coagulation Profile: No results for input(s): "INR", "PROTIME" in the last 168 hours. Cardiac Enzymes: No results for input(s): "CKTOTAL", "CKMB", "CKMBINDEX", "TROPONINI" in the last 168 hours. BNP (last 3 results) No results for input(s): "PROBNP" in the last 8760 hours. HbA1C: No results for input(s): "HGBA1C" in the last 72 hours. CBG: Recent Labs  Lab 12/21/22 1150 12/21/22 1817 12/21/22 2300 12/22/22 0553 12/22/22 1132  GLUCAP 134* 119* 151* 139* 121*    Lipid Profile: No results for input(s): "CHOL", "HDL", "LDLCALC", "TRIG", "CHOLHDL", "LDLDIRECT" in the last 72 hours.  Thyroid Function Tests: No results for input(s): "TSH", "T4TOTAL", "FREET4", "T3FREE", "THYROIDAB" in the last 72 hours. Anemia Panel: No results for input(s): "VITAMINB12", "FOLATE", "FERRITIN", "TIBC", "IRON", "RETICCTPCT" in the  last 72 hours. Sepsis Labs: Recent Labs  Lab 12/20/22 2300  PROCALCITON 0.20     Recent Results (from the past 240 hour(s))  Culture, blood (routine x 2)     Status: None   Collection Time: 12/14/22 10:24 PM   Specimen: BLOOD RIGHT ARM  Result Value Ref Range Status   Specimen Description   Final    BLOOD RIGHT ARM Performed at [facility], [address], [place], [place] [address]    Special Requests   Final    BOTTLES DRAWN AEROBIC AND ANAEROBIC Blood Culture adequate volume Performed at [facility], [address] [place], [place] [address]    Culture   Final    NO GROWTH 5 DAYS Performed at [facility], [address] [place], [place] [address]    Report Status 12/20/2022 FINAL  Final  Gastrointestinal Panel by PCR , Stool     Status: None   Collection Time: 12/15/22 12:26 PM   Specimen: STOOL  Result Value Ref Range Status   Campylobacter species NOT DETECTED NOT DETECTED Final   Plesimonas shigelloides NOT DETECTED NOT DETECTED Final   Salmonella species NOT DETECTED NOT DETECTED Final   Yersinia enterocolitica NOT DETECTED NOT DETECTED Final   Vibrio species NOT DETECTED NOT DETECTED Final   Vibrio cholerae NOT DETECTED NOT DETECTED Final   Enteroaggregative E coli (EAEC) NOT DETECTED NOT DETECTED Final   Enteropathogenic E coli (EPEC) NOT DETECTED NOT DETECTED Final   Enterotoxigenic E coli (ETEC) NOT DETECTED NOT DETECTED Final   Shiga like toxin producing E coli (STEC) NOT DETECTED NOT DETECTED Final   Shigella/Enteroinvasive E coli (EIEC) NOT DETECTED NOT DETECTED Final   Cryptosporidium NOT DETECTED NOT DETECTED Final   Cyclospora cayetanensis NOT DETECTED NOT DETECTED Final   Entamoeba histolytica NOT DETECTED NOT DETECTED Final   Giardia lamblia NOT DETECTED NOT DETECTED Final   Adenovirus F40/41 NOT DETECTED NOT DETECTED Final   Astrovirus NOT DETECTED NOT DETECTED Final   Norovirus GI/GII NOT DETECTED NOT DETECTED  Final   Rotavirus A NOT DETECTED NOT DETECTED Final   Sapovirus (I, II, IV, and V) NOT DETECTED NOT DETECTED Final    Comment: Performed at [facility], [address] [place], [place] [address]  C Difficile Quick Screen w PCR reflex     Status: None   Collection Time: 12/15/22  12:26 PM   Specimen: STOOL  Result Value Ref Range Status   C Diff antigen NEGATIVE NEGATIVE Final   C Diff toxin NEGATIVE NEGATIVE Final   C Diff interpretation No C. difficile detected.  Final    Comment: Performed at [facility], [address], [place], [place] [address]  Surgical pcr screen     Status: None   Collection Time: 12/18/22  9:02 AM   Specimen: Nasal Mucosa; Nasal Swab  Result Value Ref Range Status   MRSA, PCR NEGATIVE NEGATIVE Final   Staphylococcus aureus NEGATIVE NEGATIVE Final    Comment: (NOTE) The Xpert SA Assay (FDA approved for NASAL specimens in patients 72 years of age and older), is one component of a comprehensive surveillance program. It is not intended to diagnose infection nor to guide or monitor treatment. Performed at [facility], [address], [place], [place] [address]          Radiology Studies: DG Abd Portable 1V  Result Date: 12/22/2022 CLINICAL DATA:  [id number] Ileus following gastrointestinal surgery [facility]) [id number] EXAM: PORTABLE ABDOMEN - 1 VIEW COMPARISON:  12/19/2022 FINDINGS: The nasogastric tube is been removed. Stomach is incompletely distended. Midline skin staples. Small bowel relatively decompressed. Normal gastric distribution throughout the colon without dilatation. Surgical clips in the pelvis. No abnormal abdominal calcifications. Regional bones unremarkable. IMPRESSION: Nonobstructive bowel gas pattern. Electronically Signed   By: Corlis Leak M.D.   On: 12/22/2022 11:17   DG CHEST PORT 1 VIEW  Result Date: 12/21/2022 CLINICAL DATA:  Nasogastric tube placement. EXAM: PORTABLE CHEST 1 VIEW COMPARISON:  12/20/2022 FINDINGS:  Endotracheal tube has been removed. LEFT-sided transvenous pacemaker leads overlie the RIGHT atrium and RIGHT ventricle. There is stable elevation of the RIGHT hemidiaphragm. Lungs are clear. IMPRESSION: Interval removal of endotracheal tube.  Lungs are clear. Electronically Signed   By: Norva Pavlov M.D.   On: 12/21/2022 09:32   DG Chest Port 1 View  Result Date: 12/20/2022 CLINICAL DATA:  Shortness of breath EXAM: PORTABLE CHEST 1 VIEW COMPARISON:  12/20/2022, 12/14/2022 FINDINGS: Esophageal tube appears retracted to the level of the upper esophagus. Left-sided cardiac pacing device as before. Elevated right diaphragm. No focal airspace disease or effusion. Right upper extremity central venous catheter tip at the SVC. Stable cardiomediastinal silhouette. IMPRESSION: Esophageal tube appears retracted to the level of the upper esophagus, repositioning is recommended. No acute airspace disease. These results will be called to the ordering clinician or representative by the Radiologist Assistant, and communication documented in the PACS or [employer]. Electronically Signed   By: Jasmine Pang M.D.   On: 12/20/2022 23:36   DG Chest Port 1 View  Result Date: 12/20/2022 CLINICAL DATA:  NG tube placement EXAM: PORTABLE CHEST 1 VIEW COMPARISON:  Abdominal radiograph 12/19/2022 FINDINGS: Enteric tube tip within the stomach with side port in the distal esophagus. Recommend advancement approximately 4 cm. Left chest wall pacemaker. Low lung volumes. Elevated right hemidiaphragm. IMPRESSION: Enteric tube tip within the stomach with side port in the distal esophagus. Recommend advancement approximately 4 cm. Electronically Signed   By: Minerva Fester M.D.   On: 12/20/2022 18:28           LOS: 7 days   Time spent= 35 mins   Shonna Chock, MD [facility]  If 7PM-7AM, please contact night-coverage  12/22/2022, 1:34 PM

## 2022-12-23 NOTE — Consult Note (Signed)
   [facility] CM Inpatient Consult   [facility]) Accountable Care Organization (ACO) [facility] Liaison Note  12/23/2022  Ashe [facility] Dec 08, 1950 [id number]   Insurance: [facility] Medicare PPO   Makaveli Perezgonzalez is a 72 y.o. male who is a Primary Care Patient of Paulina Fusi, MD. The patient was screened for [facility] Liaison 7 and 30 day Readmission:[id number]} day readmission hospitalization with noted [facility] Liaison Readmission Risk Score:[id number]} risk score for unplanned readmission risk with **IP/**ED in 6 months.  The patient was assessed for potential [facility]) Care Management service needs for post hospital transition for care coordination. Review of patient's electronic medical record reveals patient is ***. Patient was given an appointment reminder card and 24 hour Nurse Advice Line magnet.   Plan: [facility] Liaison will continue to follow progress and disposition to asess for post hospital community care coordination/management needs.  Referral request for community care coordination: {CHL AMB [facility] Pending Disposition or [facility] TOC M/W:[id number]}   [facility] Care Management/Population Health does not replace or interfere with any arrangements made by the Inpatient Transition of Care team.   For questions contact:   ***

## 2022-12-23 NOTE — Progress Notes (Signed)
5 Days Post-Op   Subjective/Chief Complaint: Complains of soreness. Passing flatus   Objective: Vital signs in last 24 hours: Temp:  [98.1 F (36.7 C)-99.1 F (37.3 C)] 98.1 F (36.7 C) (05/20 0745) Pulse Rate:  [76-104] 76 (05/20 0745) Resp:  [14-20] 16 (05/20 0745) BP: (108-150)/(72-104) 126/72 (05/20 0745) SpO2:  [95 %-100 %] 100 % (05/20 0745) Weight:  [93.8 kg] 93.8 kg (05/20 0400) Last BM Date : 12/18/22  Intake/Output from previous day: 05/19 0701 - 05/20 0700 In: 3046.4 [I.V.:2746.4; IV Piggyback:300] Out: 1250 [Urine:1250] Intake/Output this shift: No intake/output data recorded.  General appearance: alert and cooperative Resp: clear to auscultation bilaterally Cardio: irregularly irregular rhythm GI: soft, mild tenderness. Good bs. Incision looks good  Lab Results:  Recent Labs    12/22/22 0540 12/23/22 0507  WBC 6.1 4.9  HGB 8.5* 7.6*  HCT 26.5* 23.9*  PLT 209 191   BMET Recent Labs    12/22/22 0540 12/23/22 0507  NA 138 135  K 4.2 3.9  CL 101 101  CO2 28 27  GLUCOSE 152* 176*  BUN 22 23  CREATININE 1.15 0.73  CALCIUM 8.7* 8.6*   PT/INR No results for input(s): "LABPROT", "INR" in the last 72 hours. ABG No results for input(s): "PHART", "HCO3" in the last 72 hours.  Invalid input(s): "PCO2", "PO2"  Studies/Results: DG Abd Portable 1V  Result Date: 12/22/2022 CLINICAL DATA:  [id number] Ileus following gastrointestinal surgery (HCC) [id number] EXAM: PORTABLE ABDOMEN - 1 VIEW COMPARISON:  12/19/2022 FINDINGS: The nasogastric tube is been removed. Stomach is incompletely distended. Midline skin staples. Small bowel relatively decompressed. Normal gastric distribution throughout the colon without dilatation. Surgical clips in the pelvis. No abnormal abdominal calcifications. Regional bones unremarkable. IMPRESSION: Nonobstructive bowel gas pattern. Electronically Signed   By: Corlis Leak M.D.   On: 12/22/2022 11:17    Anti-infectives: Anti-infectives  (From admission, onward)    Start     Dose/Rate Route Frequency Ordered Stop   12/19/22 2000  piperacillin-tazobactam (ZOSYN) IVPB 3.375 g        3.375 g 12.5 mL/hr over 240 Minutes Intravenous Every 8 hours 12/19/22 1550 12/23/22 2359   12/16/22 1100  piperacillin-tazobactam (ZOSYN) IVPB 3.375 g  Status:  Discontinued        3.375 g 12.5 mL/hr over 240 Minutes Intravenous Every 8 hours 12/16/22 0954 12/19/22 1550   12/16/22 0400  ampicillin-sulbactam (UNASYN) 1.5 g in sodium chloride 0.9 % 100 mL IVPB  Status:  Discontinued        1.5 g 200 mL/hr over 30 Minutes Intravenous Every 6 hours 12/16/22 0258 12/16/22 0855   12/15/22 1630  piperacillin-tazobactam (ZOSYN) IVPB 3.375 g  Status:  Discontinued        3.375 g 12.5 mL/hr over 240 Minutes Intravenous Every 8 hours 12/15/22 1528 12/16/22 0307   12/15/22 1000  azithromycin (ZITHROMAX) tablet 500 mg  Status:  Discontinued        500 mg Oral Daily 12/15/22 0900 12/16/22 0258   12/15/22 0045  piperacillin-tazobactam (ZOSYN) IVPB 3.375 g        3.375 g 12.5 mL/hr over 240 Minutes Intravenous  Once 12/15/22 0044 12/15/22 0446       Assessment/Plan: s/p Procedure(s): EXPLORATORY LAPAROTOMY, BOWEL RESECTION (N/A) Advance diet. Allow sips of clears today Continue tpn until reliable nutrition is tolerated POD 5 s/p Exploratory laparotomy, lysis of adhesions, small bowel resection with primary anastomosis for Contained perforation of jejunal diverticulum by Dr. Corliss Skains on 12/18/22 - Path benign -  NG removed by patient, will try without it. If he has n/v needs replaced (discussed with nurse) - Cont TPN - IV Tylenol and Robaxin while NGT in place. PRN dilaudid.  - Cont abx, plan 5d post op - Mobilize - PT/OT - Pulm toilet, IS, wean o2 as able -afib. Cardizem drip per Cardiology. Rate needs to be controlled before he will participate in therapy   FEN - NPO, PICC/TPN VTE - SCDs, heparin gtt.  ID - Zosyn Foley - Out POD 1, voiding.     CLL  (last chemo in November but on venetoclax)  Recent resp issues - bronchitis, pneumonia, RSV  A. Fib/Bradycardia - pacemaker placement about a month ago   LOS: 8 days    Jonathon Parker 12/23/2022

## 2022-12-23 NOTE — Progress Notes (Signed)
PT Cancellation Note  Patient Details Name: Jonathon Parker [facility] MRN: [id number] DOB: April 05, 1951   Cancelled Treatment:    Reason Eval/Treat Not Completed: Medical issues which prohibited therapy (Per RN, pt has been going in and out of [facility] today. Requests to hold therapy today. PT will follow up tomorrow.)   Gladys Damme 12/23/2022, 10:39 AM

## 2022-12-24 DIAGNOSIS — K529 Noninfective gastroenteritis and colitis, unspecified: Secondary | ICD-10-CM | POA: Diagnosis not present

## 2022-12-24 DIAGNOSIS — I4811 Longstanding persistent atrial fibrillation: Secondary | ICD-10-CM

## 2022-12-24 LAB — GLUCOSE, CAPILLARY
Glucose-Capillary: 116 mg/dL — ABNORMAL HIGH (ref 70–99)
Glucose-Capillary: 131 mg/dL — ABNORMAL HIGH (ref 70–99)
Glucose-Capillary: 133 mg/dL — ABNORMAL HIGH (ref 70–99)
Glucose-Capillary: 140 mg/dL — ABNORMAL HIGH (ref 70–99)

## 2022-12-24 LAB — TYPE AND SCREEN

## 2022-12-24 LAB — BPAM RBC: Blood Product Expiration Date: 202406142359

## 2022-12-24 LAB — HEPARIN LEVEL (UNFRACTIONATED): Heparin Unfractionated: 0.57 IU/mL (ref 0.30–0.70)

## 2022-12-24 LAB — CBC
HCT: 22.4 % — ABNORMAL LOW (ref 39.0–52.0)
Hemoglobin: 7.2 g/dL — ABNORMAL LOW (ref 13.0–17.0)
MCH: 33.2 pg (ref 26.0–34.0)
MCHC: 32.1 g/dL (ref 30.0–36.0)
MCV: 103.2 fL — ABNORMAL HIGH (ref 80.0–100.0)
Platelets: 217 10*3/uL (ref 150–400)
RBC: 2.17 MIL/uL — ABNORMAL LOW (ref 4.22–5.81)
RDW: 13.3 % (ref 11.5–15.5)
WBC: 6.9 10*3/uL (ref 4.0–10.5)
nRBC: 0.3 % — ABNORMAL HIGH (ref 0.0–0.2)

## 2022-12-24 LAB — PREPARE RBC (CROSSMATCH)

## 2022-12-24 MED ORDER — METOPROLOL TARTRATE 50 MG PO TABS
50.0000 mg | ORAL_TABLET | Freq: Two times a day (BID) | ORAL | Status: DC
  Administered 2022-12-24 – 2022-12-25 (×3): 50 mg via ORAL
  Filled 2022-12-24 (×3): qty 1

## 2022-12-24 MED ORDER — QUETIAPINE FUMARATE 25 MG PO TABS
25.0000 mg | ORAL_TABLET | Freq: Every day | ORAL | Status: DC
  Administered 2022-12-24: 25 mg via ORAL
  Filled 2022-12-24: qty 1

## 2022-12-24 MED ORDER — QUETIAPINE FUMARATE 25 MG PO TABS
12.5000 mg | ORAL_TABLET | Freq: Every morning | ORAL | Status: DC
  Administered 2022-12-24: 12.5 mg via ORAL
  Filled 2022-12-24: qty 1

## 2022-12-24 MED ORDER — TRAVASOL 10 % IV SOLN
INTRAVENOUS | Status: AC
  Filled 2022-12-24: qty 1254

## 2022-12-24 MED ORDER — SODIUM CHLORIDE 0.9% IV SOLUTION: Freq: Once | INTRAVENOUS | Status: AC

## 2022-12-24 MED ORDER — BOOST / RESOURCE BREEZE PO LIQD CUSTOM
1.0000 | Freq: Three times a day (TID) | ORAL | Status: DC
  Administered 2022-12-24 – 2022-12-27 (×5): 1 via ORAL

## 2022-12-24 NOTE — Progress Notes (Signed)
6 Days Post-Op   Subjective/Chief Complaint: No complaints other than soreness   Objective: Vital signs in last 24 hours: Temp:  [97.6 F (36.4 C)-98.4 F (36.9 C)] 97.6 F (36.4 C) (05/21 0730) Pulse Rate:  [51-132] 107 (05/21 0730) Resp:  [15-21] 16 (05/21 0730) BP: (115-144)/(63-90) 144/76 (05/21 0730) SpO2:  [96 %-99 %] 98 % (05/21 0730) Weight:  [92.2 kg] 92.2 kg (05/21 0517) Last BM Date : 12/18/22  Intake/Output from previous day: 05/20 0701 - 05/21 0700 In: 0  Out: 1250 [Urine:1250] Intake/Output this shift: No intake/output data recorded.  General appearance: alert and cooperative Resp: clear to auscultation bilaterally Cardio: regular rate and rhythm GI: soft, mild tenderness. Good bs. Incision ok  Lab Results:  Recent Labs    12/23/22 0507 12/23/22 1229 12/24/22 0525  WBC 4.9  --  6.9  HGB 7.6* 8.2* 7.2*  HCT 23.9*  --  22.4*  PLT 191  --  217   BMET Recent Labs    12/22/22 0540 12/23/22 0507  NA 138 135  K 4.2 3.9  CL 101 101  CO2 28 27  GLUCOSE 152* 176*  BUN 22 23  CREATININE 1.15 0.73  CALCIUM 8.7* 8.6*   PT/INR No results for input(s): "LABPROT", "INR" in the last 72 hours. ABG No results for input(s): "PHART", "HCO3" in the last 72 hours.  Invalid input(s): "PCO2", "PO2"  Studies/Results: DG Abd Portable 1V  Result Date: 12/22/2022 CLINICAL DATA:  [id number] Ileus following gastrointestinal surgery (HCC) [id number] EXAM: PORTABLE ABDOMEN - 1 VIEW COMPARISON:  12/19/2022 FINDINGS: The nasogastric tube is been removed. Stomach is incompletely distended. Midline skin staples. Small bowel relatively decompressed. Normal gastric distribution throughout the colon without dilatation. Surgical clips in the pelvis. No abnormal abdominal calcifications. Regional bones unremarkable. IMPRESSION: Nonobstructive bowel gas pattern. Electronically Signed   By: Corlis Leak M.D.   On: 12/22/2022 11:17    Anti-infectives: Anti-infectives (From admission,  onward)    Start     Dose/Rate Route Frequency Ordered Stop   12/19/22 2000  piperacillin-tazobactam (ZOSYN) IVPB 3.375 g        3.375 g 12.5 mL/hr over 240 Minutes Intravenous Every 8 hours 12/19/22 1550 12/23/22 2359   12/16/22 1100  piperacillin-tazobactam (ZOSYN) IVPB 3.375 g  Status:  Discontinued        3.375 g 12.5 mL/hr over 240 Minutes Intravenous Every 8 hours 12/16/22 0954 12/19/22 1550   12/16/22 0400  ampicillin-sulbactam (UNASYN) 1.5 g in sodium chloride 0.9 % 100 mL IVPB  Status:  Discontinued        1.5 g 200 mL/hr over 30 Minutes Intravenous Every 6 hours 12/16/22 0258 12/16/22 0855   12/15/22 1630  piperacillin-tazobactam (ZOSYN) IVPB 3.375 g  Status:  Discontinued        3.375 g 12.5 mL/hr over 240 Minutes Intravenous Every 8 hours 12/15/22 1528 12/16/22 0307   12/15/22 1000  azithromycin (ZITHROMAX) tablet 500 mg  Status:  Discontinued        500 mg Oral Daily 12/15/22 0900 12/16/22 0258   12/15/22 0045  piperacillin-tazobactam (ZOSYN) IVPB 3.375 g        3.375 g 12.5 mL/hr over 240 Minutes Intravenous  Once 12/15/22 0044 12/15/22 0446       Assessment/Plan: s/p Procedure(s): EXPLORATORY LAPAROTOMY, BOWEL RESECTION (N/A) Advance diet. Allow clears today May start back on oral meds Ambulate Afib per cards POD 6 s/p Exploratory laparotomy, lysis of adhesions, small bowel resection with primary anastomosis for Contained perforation  of jejunal diverticulum by Dr. Corliss Skains on 12/18/22 - Path benign - NG removed by patient, will try without it. If he has n/v needs replaced (discussed with nurse) - Cont TPN - IV Tylenol and Robaxin while NGT in place. PRN dilaudid.  - Cont abx, plan 5d post op - Mobilize - PT/OT - Pulm toilet, IS, wean o2 as able -afib. Cardizem drip per Cardiology. Rate needs to be controlled before he will participate in therapy   FEN - NPO, PICC/TPN VTE - SCDs, heparin gtt.  ID - Zosyn Foley - Out POD 1, voiding.     CLL (last chemo in  November but on venetoclax)  Recent resp issues - bronchitis, pneumonia, RSV  A. Fib/Bradycardia - pacemaker placement about a month ago   LOS: 9 days    Jonathon Parker 12/24/2022

## 2022-12-24 NOTE — Progress Notes (Signed)
ANTICOAGULATION CONSULT NOTE  Pharmacy Consult for Heparin (Eliquis on hold) Indication: atrial fibrillation  Allergies  Allergen Reactions   Lisinopril Cough   Zolpidem Other (See Comments)    "Got me in outer space"     Patient Measurements: Height: 6' 0.5" (184.2 cm) Weight: 92.2 kg (203 lb 4.2 oz) IBW/kg (Calculated) : 78.75 Heparin Dosing Weight: 90 kg  Vital Signs: Temp: 97.8 F (36.6 C) (05/21 0517) Temp Source: Oral (05/21 0517) BP: 137/90 (05/21 0517) Pulse Rate: 94 (05/21 0517)  Labs: Recent Labs    12/22/22 0540 12/22/22 1454 12/23/22 0507 12/23/22 1229 12/24/22 0525  HGB 8.5*  --  7.6* 8.2* 7.2*  HCT 26.5*  --  23.9*  --  22.4*  PLT 209  --  191  --  217  HEPARINUNFRC 0.74* 0.63 0.58  --  0.57  CREATININE 1.15  --  0.73  --   --      Estimated Creatinine Clearance: 93 mL/min (by C-G formula based on SCr of 0.73 mg/dL).  Assessment: 72 yr old male on Eliquis 5 mg BID prior to admission for atrial fibrillation.  Eliquis stopped after 8am dose on 5/13 for possible surgery. Pharmacy dosing heparin while he is NPO.  -Heparin continues to be therapeutic. Pt is POD6 for bowel surgery.  -hg= 7.2  Goal of Therapy:  Heparin level 0.3-0.7 units/ml Monitor platelets by anticoagulation protocol: Yes   Plan:  Continue heparin infusion at 1450 units/hr Check heparin level daily while on heparin Continue to monitor H&H and platelets  Harland German, PharmD Clinical Pharmacist **Pharmacist phone directory can now be found on [email or web address] ([email or web address]).  Listed under [facility].

## 2022-12-24 NOTE — Progress Notes (Signed)
PROGRESS NOTE    Jonathon Parker [facility]  ZOX:[id number] DOB: 1950-12-07 DOA: 12/14/2022 PCP: Paulina Fusi, MD    Brief Narrative:  72 year old gentleman with history of CLL, paroxysmal A-fib on Eliquis, history of colon cancer and left hemicolectomy more than 10 years ago, GERD who was suffering from enteritis and recently on oral antibiotics came to the emergency room with worsening abdominal pain and confusion associated with watery stools.  Workup in the emergency room revealed perforated diverticulitis of the small bowel in the right upper quadrant.  Started on IV antibiotics and resuscitation.  Patient underwent surgical exploration on 5/15.  Postoperative recovery complicated with delirium, A-fib and prolonged ileus.   Assessment & Plan:   Perforated diverticulitis of the small bowel: Exploratory laparotomy lysis of adhesion and small bowel resection with primary anastomosis 5/15 Prolonged ileus. Now on clears, started to have BM today. Remains on TPN due to poor oral intake, pharmacy managing. Continue mobility. Completed antibiotics after the procedure. Hopefully he can tolerate diet and can come off TPN next few days.  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 Patient currently remains on heparin perioperative, will change to Eliquis once there are no more surgical procedures needed and patient is eating regular diet. On Eliquis at home, currently remains on heparin perioperative. Seen by cardiology, started on metoprolol 50 mg twice daily, will slowly escalate as needed.  Control heart rate with injectable metoprolol. Echocardiogram with ejection fraction 55 to 60%.  Acute blood loss anemia on chronic macrocytic anemia: Baseline hemoglobin about 12. Postop hemoglobin 7.6-7.2.  Patient with active cardiovascular disease, will need hemoglobin more than 8.  Transfuse and monitor.  1 unit transfusion return  today.  Hospital-acquired delirium: Patient with underlying history of dementia.  He has developed significant tremulousness and confusion. Will start patient on Seroquel 12.5 mg in the morning, 25 mg the evening.  Depression, dementia without behavioral disturbances: On Wellbutrin, Aricept and Seroquel.  Obstructive sleep apnea: Use CPAP as tolerated.  Patient is usually declining.  DVT prophylaxis: Place and maintain sequential compression device Start: 12/16/22 1321   Code Status: Full code Family Communication: Wife at bedside Disposition Plan: Status is: Inpatient Remains inpatient appropriate because: Significant delirium, TPN, A-fib with RVR, heparin drip.     Consultants:  Cardiology Surgery  Procedures:  Ex lap 5/50  Antimicrobials:  Completed Zosyn   Subjective: Patient seen and examined.  Wife at the bedside.  Patient tells me that he had a restless night, could not sleep well and family noted to be more agitated however re-orientable. Patient denies any nausea vomiting or abdominal pain. He was passing flatus, later today he had a bowel movement.  Starting liquid diet. Telemetry monitor shows A-fib with occasional RVR and heart rate more than 140.  Objective: Vitals:   12/24/22 0300 12/24/22 0517 12/24/22 0730 12/24/22 1147  BP:  (!) 137/90 (!) 144/76 (!) 120/54  Pulse:  94 (!) 107 81  Resp: 16 15 16  (!) 22  Temp:  97.8 F (36.6 C) 97.6 F (36.4 C) (!) 97.5 F (36.4 C)  TempSrc:  Oral Oral Oral  SpO2: 98% 99% 98% 99%  Weight:  92.2 kg    Height:        Intake/Output Summary (Last 24 hours) at 12/24/2022 1317 Last data filed at 12/24/2022 0549 Gross per 24 hour  Intake --  Output 1250 ml  Net -1250 ml   Filed Weights   12/22/22 0342 12/23/22 0400 12/24/22 0517  Weight: 93 kg 93.8 kg 92.2 kg    Examination:  General: Sick looking.  Tremulous. Patient is alert and oriented x 3-4.  He is mostly oriented.  Forgetful. Does not have any focal  neurological deficits. Cardiovascular: S1-S2 normal.  Irregularly irregular.  Tachycardic. Respiratory: Bilateral clear.  No added sounds. Gastrointestinal: Soft.  Midline incision with intact staples.  Anticipated tenderness along the incision line.  No rigidity or guarding. Ext: No edema.     Data Reviewed: I have personally reviewed following labs and imaging studies  CBC: Recent Labs  Lab 12/20/22 0510 12/21/22 0435 12/22/22 0540 12/23/22 0507 12/23/22 1229 12/24/22 0525  WBC 6.4 QUESTIONABLE RESULTS, RECOMMEND RECOLLECT TO VERIFY 6.1 4.9  --  6.9  HGB 9.6* QUESTIONABLE RESULTS, RECOMMEND RECOLLECT TO VERIFY 8.5* 7.6* 8.2* 7.2*  HCT 29.6* QUESTIONABLE RESULTS, RECOMMEND RECOLLECT TO VERIFY 26.5* 23.9*  --  22.4*  MCV 102.4* QUESTIONABLE RESULTS, RECOMMEND RECOLLECT TO VERIFY 102.3* 103.5*  --  103.2*  PLT 209 QUESTIONABLE RESULTS, RECOMMEND RECOLLECT TO VERIFY 209 191  --  217   Basic Metabolic Panel: Recent Labs  Lab 12/19/22 0900 12/20/22 0510 12/21/22 0550 12/22/22 0540 12/23/22 0507  NA 135 138 138 138 135  K 4.3 3.8 4.0 4.2 3.9  CL 101 100 100 101 101  CO2 24 27 28 28 27   GLUCOSE 137* 107* 124* 152* 176*  BUN 19 14 18 22 23   CREATININE 1.01 0.80 0.88 1.15 0.73  CALCIUM 8.7* 8.5* 8.9 8.7* 8.6*  MG 2.2 2.1 2.0 1.8 2.0  PHOS 2.5 2.4* 3.6 1.7* 2.8   GFR: Estimated Creatinine Clearance: 93 mL/min (by C-G formula based on SCr of 0.73 mg/dL). Liver Function Tests: Recent Labs  Lab 12/18/22 0330 12/19/22 0900 12/23/22 0507  AST 14* 21 35  ALT 16 19 42  ALKPHOS 145* 115 163*  BILITOT 0.9 1.0 0.8  PROT 6.0* 6.1* 5.7*  ALBUMIN 2.7* 2.8* 2.4*   No results for input(s): "LIPASE", "AMYLASE" in the last 168 hours. No results for input(s): "AMMONIA" in the last 168 hours. Coagulation Profile: No results for input(s): "INR", "PROTIME" in the last 168 hours. Cardiac Enzymes: No results for input(s): "CKTOTAL", "CKMB", "CKMBINDEX", "TROPONINI" in the last 168  hours. BNP (last 3 results) No results for input(s): "PROBNP" in the last 8760 hours. HbA1C: No results for input(s): "HGBA1C" in the last 72 hours. CBG: Recent Labs  Lab 12/23/22 0527 12/23/22 1816 12/24/22 0026 12/24/22 0545 12/24/22 1144  GLUCAP 179* 147* 133* 131* 140*   Lipid Profile: Recent Labs    12/23/22 0507  TRIG 116   Thyroid Function Tests: No results for input(s): "TSH", "T4TOTAL", "FREET4", "T3FREE", "THYROIDAB" in the last 72 hours. Anemia Panel: No results for input(s): "VITAMINB12", "FOLATE", "FERRITIN", "TIBC", "IRON", "RETICCTPCT" in the last 72 hours. Sepsis Labs: Recent Labs  Lab 12/20/22 2300  PROCALCITON 0.20    Recent Results (from the past 240 hour(s))  Culture, blood (routine x 2)     Status: None   Collection Time: 12/14/22 10:24 PM   Specimen: BLOOD RIGHT ARM  Result Value Ref Range Status   Specimen Description   Final    BLOOD RIGHT ARM Performed at [facility], [address], [place], [place] [address]    Special Requests   Final    BOTTLES DRAWN AEROBIC AND ANAEROBIC Blood Culture adequate volume Performed at [facility], [address], [place], [place] [address]    Culture   Final  NO GROWTH 5 DAYS Performed at [facility], [address] [place], [place] [address]    Report Status 12/20/2022 FINAL  Final  Gastrointestinal Panel by PCR , Stool     Status: None   Collection Time: 12/15/22 12:26 PM   Specimen: STOOL  Result Value Ref Range Status   Campylobacter species NOT DETECTED NOT DETECTED Final   Plesimonas shigelloides NOT DETECTED NOT DETECTED Final   Salmonella species NOT DETECTED NOT DETECTED Final   Yersinia enterocolitica NOT DETECTED NOT DETECTED Final   Vibrio species NOT DETECTED NOT DETECTED Final   Vibrio cholerae NOT DETECTED NOT DETECTED Final   Enteroaggregative E coli (EAEC) NOT DETECTED NOT DETECTED Final   Enteropathogenic E coli (EPEC) NOT DETECTED NOT  DETECTED Final   Enterotoxigenic E coli (ETEC) NOT DETECTED NOT DETECTED Final   Shiga like toxin producing E coli (STEC) NOT DETECTED NOT DETECTED Final   Shigella/Enteroinvasive E coli (EIEC) NOT DETECTED NOT DETECTED Final   Cryptosporidium NOT DETECTED NOT DETECTED Final   Cyclospora cayetanensis NOT DETECTED NOT DETECTED Final   Entamoeba histolytica NOT DETECTED NOT DETECTED Final   Giardia lamblia NOT DETECTED NOT DETECTED Final   Adenovirus F40/41 NOT DETECTED NOT DETECTED Final   Astrovirus NOT DETECTED NOT DETECTED Final   Norovirus GI/GII NOT DETECTED NOT DETECTED Final   Rotavirus A NOT DETECTED NOT DETECTED Final   Sapovirus (I, II, IV, and V) NOT DETECTED NOT DETECTED Final    Comment: Performed at [facility], [address] [place], [place] [address]  C Difficile Quick Screen w PCR reflex     Status: None   Collection Time: 12/15/22 12:26 PM   Specimen: STOOL  Result Value Ref Range Status   C Diff antigen NEGATIVE NEGATIVE Final   C Diff toxin NEGATIVE NEGATIVE Final   C Diff interpretation No C. difficile detected.  Final    Comment: Performed at [facility], [address], [place], [place] [address]  Surgical pcr screen     Status: None   Collection Time: 12/18/22  9:02 AM   Specimen: Nasal Mucosa; Nasal Swab  Result Value Ref Range Status   MRSA, PCR NEGATIVE NEGATIVE Final   Staphylococcus aureus NEGATIVE NEGATIVE Final    Comment: (NOTE) The Xpert SA Assay (FDA approved for NASAL specimens in patients 54 years of age and older), is one component of a comprehensive surveillance program. It is not intended to diagnose infection nor to guide or monitor treatment. Performed at [facility], [address] [place], [place] [address]          Radiology Studies: No results found.      Scheduled Meds:  sodium chloride   Intravenous Once   atorvastatin  20 mg Oral Daily   buPROPion  300 mg Oral Daily   Chlorhexidine  Gluconate Cloth  6 each Topical Daily   colestipol  1 g Oral Daily   donepezil  5 mg Oral Daily   insulin aspart  0-9 Units Subcutaneous Q6H   melatonin  3 mg Oral QHS   metoprolol tartrate  50 mg Oral BID   QUEtiapine  12.5 mg Oral q morning   QUEtiapine  25 mg Oral QHS   scopolamine  1 patch Transdermal Q72H   sertraline  25 mg Oral Daily   sodium chloride flush  10-40 mL Intracatheter Q12H   traZODone  100 mg Oral QHS   Continuous Infusions:  sodium chloride 10  mL/hr at 12/19/22 2022   diltiazem (CARDIZEM) infusion 10 mg/hr (12/23/22 0658)   heparin 1,450 Units/hr (12/23/22 1953)   methocarbamol (ROBAXIN) IV 1,000 mg (12/24/22 0549)   TPN ADULT (ION) 95 mL/hr at 12/23/22 1819   TPN ADULT (ION)       LOS: 9 days    Time spent: 40 minutes     Dorcas Carrow, MD [facility] Pager [phone]

## 2022-12-24 NOTE — Progress Notes (Signed)
PHARMACY - TOTAL PARENTERAL NUTRITION CONSULT NOTE  Indication: Small bowel diverticula perforation   Patient Measurements: Height: 6' 0.5" (184.2 cm) Weight: 92.2 kg (203 lb 4.2 oz) IBW/kg (Calculated) : 78.75 TPN AdjBW (KG): 95.3 Body mass index is 27.19 kg/m.  Assessment:  3 YOM presented to [facility] on 5/11 with fevers, abdominal pain and confusion.  PMH significant for CLL, and colon cancer post L hemicolectomy.  Recent history of pacemaker placement for Afib/brady and enteritis.  CT showed small bowel diverticulitis vs enteritis vs SBO with ischemia.  Patient was on a CLD on 5/13 and began having increased abdominal pain.  Surgery plans to perform an ex-lap with SBR.  Patient reports eating two meals a day and intake was reduced to ~50% given his abdominal pain that started months ago.  Patient feels that he lost weight but is unsure of how much.  Glucose / Insulin: no hx DM - CBGs < 180 - 10 units SSI Electrolytes: Na 135, K 3.9, Ca 8.6 [CoCa 9.88] , Mag 2.0, Phos 2.8 Renal: SCr 0.73, BUN WNL Hepatic: AST/ALT WNL, Alk Phos 163, tbili normalized, albumin 2.4 Intake / Output; MIVF: UOP 0.56 ml/kg/hr, LBM 5/15, NG 0mL GI Imaging: 5/19 portable X-ray shows nonobstructive bowel gas pattern GI Surgeries / Procedures:  5/15 ex-lap with LoA, SBR with primary anastomosis  Central access: PICC placed 12/17/22 TPN start date: 12/18/22  Nutritional Goals: RD Estimated Needs Total Energy Estimated Needs: 2400-2600 Total Protein Estimated Needs: 120-135g Total Fluid Estimated Needs: >/=2L  Current Nutrition:  TPN  Plan:  Continue TPN at 95 mL/hr at 1800 (goal rate 95 ml/hr) Electrolytes in TPN: Na 85 mEq/L, K 38 mEq/L, Ca 5 mEq/L, Mg 5 mEq/L, Phos 22 mmol/L, Cl:Ac 1:1 Add standard MVI and trace elements to TPN Continue moderate SSI Q6H Standard TPN labs on Mon and Thurs  Jasmine Mcbeth BS, PharmD, BCPS Clinical Pharmacist 12/24/2022 7:51 AM  Contact: [phone] after 3  PM  "Be curious, not judgmental..." -Debbora Dus

## 2022-12-24 NOTE — Progress Notes (Addendum)
Rounding Note    Patient Name: Jonathon Parker [facility] Date of Encounter: 12/24/2022  [facility] Cardiologist: Nanetta Batty, MD   Subjective   Sitting up in bed. Wife at the bedside.   Inpatient Medications    Scheduled Meds:  atorvastatin  20 mg Oral Daily   buPROPion  300 mg Oral Daily   Chlorhexidine Gluconate Cloth  6 each Topical Daily   colestipol  1 g Oral Daily   donepezil  5 mg Oral Daily   insulin aspart  0-9 Units Subcutaneous Q6H   melatonin  3 mg Oral QHS   QUEtiapine  12.5 mg Oral q morning   QUEtiapine  25 mg Oral QHS   scopolamine  1 patch Transdermal Q72H   sertraline  25 mg Oral Daily   sodium chloride flush  10-40 mL Intracatheter Q12H   traZODone  100 mg Oral QHS   Continuous Infusions:  sodium chloride 10 mL/hr at 12/19/22 2022   diltiazem (CARDIZEM) infusion 10 mg/hr (12/23/22 0658)   heparin 1,450 Units/hr (12/23/22 1953)   methocarbamol (ROBAXIN) IV 1,000 mg (12/24/22 0549)   TPN ADULT (ION) 95 mL/hr at 12/23/22 1819   TPN ADULT (ION)     PRN Meds: sodium chloride, albuterol, HYDROmorphone (DILAUDID) injection, metoprolol tartrate, ondansetron **OR** ondansetron (ZOFRAN) IV, sodium chloride flush   Vital Signs    Vitals:   12/24/22 0200 12/24/22 0300 12/24/22 0517 12/24/22 0730  BP:   (!) 137/90 (!) 144/76  Pulse:   94 (!) 107  Resp: 17 16 15 16   Temp:   97.8 F (36.6 C) 97.6 F (36.4 C)  TempSrc:   Oral Oral  SpO2: 96% 98% 99% 98%  Weight:   92.2 kg   Height:        Intake/Output Summary (Last 24 hours) at 12/24/2022 0913 Last data filed at 12/24/2022 0549 Gross per 24 hour  Intake --  Output 1250 ml  Net -1250 ml      12/24/2022    5:17 AM 12/23/2022    4:00 AM 12/22/2022    3:42 AM  Last 3 Weights  Weight (lbs) 203 lb 4.2 oz 206 lb 12.7 oz 205 lb 0.4 oz  Weight (kg) 92.2 kg 93.8 kg 93 kg      Telemetry    Atrial fibrillation/flutter, rates appear falsely elevated, baseline closer to 90-110 - Personally  Reviewed  ECG    No new tracing   Physical Exam   GEN: No acute distress. Pale, ill appearing older male Neck: No JVD Cardiac: Irreg Irreg, no murmurs, rubs, or gallops.  Respiratory: Clear to auscultation bilaterally. GI: tender MS: No edema; No deformity. Neuro:  Nonfocal  Psych: Normal affect   Labs    High Sensitivity Troponin:  No results for input(s): "TROPONINIHS" in the last 720 hours.   Chemistry Recent Labs  Lab 12/18/22 0330 12/19/22 0900 12/20/22 0510 12/21/22 0550 12/22/22 0540 12/23/22 0507  NA 136 135   < > 138 138 135  K 3.9 4.3   < > 4.0 4.2 3.9  CL 99 101   < > 100 101 101  CO2 25 24   < > 28 28 27   GLUCOSE 84 137*   < > 124* 152* 176*  BUN 8 19   < > 18 22 23   CREATININE 1.06 1.01   < > 0.88 1.15 0.73  CALCIUM 8.7* 8.7*   < > 8.9 8.7* 8.6*  MG 2.2 2.2   < > 2.0  1.8 2.0  PROT 6.0* 6.1*  --   --   --  5.7*  ALBUMIN 2.7* 2.8*  --   --   --  2.4*  AST 14* 21  --   --   --  35  ALT 16 19  --   --   --  42  ALKPHOS 145* 115  --   --   --  163*  BILITOT 0.9 1.0  --   --   --  0.8  GFRNONAA >60 >60   < > >60 >60 >60  ANIONGAP 12 10   < > 10 9 7    < > = values in this interval not displayed.    Lipids  Recent Labs  Lab 12/23/22 0507  TRIG 116    Hematology Recent Labs  Lab 12/22/22 0540 12/23/22 0507 12/23/22 1229 12/24/22 0525  WBC 6.1 4.9  --  6.9  RBC 2.59* 2.31*  --  2.17*  HGB 8.5* 7.6* 8.2* 7.2*  HCT 26.5* 23.9*  --  22.4*  MCV 102.3* 103.5*  --  103.2*  MCH 32.8 32.9  --  33.2  MCHC 32.1 31.8  --  32.1  RDW 12.7 12.7  --  13.3  PLT 209 191  --  217   Thyroid No results for input(s): "TSH", "FREET4" in the last 168 hours.  BNP Recent Labs  Lab 12/20/22 2300  BNP 336.7*    DDimer No results for input(s): "DDIMER" in the last 168 hours.   Radiology    No results found.  Cardiac Studies   N/a   Patient Profile     72 y.o. male with a hx of tachycardia-bradycardia syndrome s/p [employer] DC PPM with conduction system  pacing (11/12/22), paroxysmal atrial fibrillation on Eliquis (diagnosed March 2024), CLL s/p acalabrutinib, venetoclax, and obinutuzumab, CVA with residual paresthesia, hypertension, hyperlipidemia who is being seen 12/23/2022 for management of afib at the request of Dr. Ashok Pall.   Assessment & Plan    Atrial fibrillation (dx in 10/2022) Tachy-brady syndrome s/p PPM -- has been following with cardiology through [facility] with recent PPM placement back in March -- has had elevated HRs in the setting of acute illness, but rates seem to be improving.  -- taking PO meds this morning, received metoprolol 50mg  this morning. In review of telemetry, HR is falsely elevated with double counting. Rates ranging in the 90-110 range -- continue metoprolol 50mg  BID -- resume OAC per surgery  HTN -- stable -- as above, continue metoprolol 50mg  BID  Per Primary -- Perforated diverticulitis of small bowel POD 5 s/p ex lap with lysis of adhesion and small bowel resection with primary anastomosis -- Acute anemia ( would consider transfusion given his ongoing anemia, suspect this would help with elevated HRs) -- Delirium/dementia -- Depression -- CLL   For questions or updates, please contact [facility] Please consult [email or web address] for contact info under        Signed, Laverda Page, NP  12/24/2022, 9:13 AM     Agree with note by Laverda Page NP-C  Patient currently taking p.o. meds.  Beta-blocker ordered.  Heart rate around 100.  On IV heparin.  Can titrate beta-blocker depending on heart rate response.  Would benefit from transfusion with hemoglobin of 7.2 this morning.  Suggest hemoglobin greater than 10.  Can transition to Eliquis once okay with surgery.  Can follow-up with his primary cardiologist after discharge.  Will sign off.   Runell Gess, M.D., FACP,  [facility] [address] [place], [place]   [address]  [phone] 12/24/2022 10:28 AM

## 2022-12-24 NOTE — Progress Notes (Signed)
PT Cancellation Note  Patient Details Name: Delma Mccarville [facility] MRN: [id number] DOB: 18-Dec-1950   Cancelled Treatment:    Reason Eval/Treat Not Completed: Medical issues which prohibited therapy (Reached out to RN who stated that pt is not appropriate for PT again today. Will try again tomorrow if able.)   Gladys Damme 12/24/2022, 8:05 AM

## 2022-12-24 NOTE — Progress Notes (Signed)
Nutrition Follow-up  DOCUMENTATION CODES:   Non-severe (moderate) malnutrition in context of chronic illness  INTERVENTION:  Recommend continuing TPN until pt is tolerating at least 60% of minimum estimated needs  Boost Breeze po TID, each supplement provides 250 kcal and 9 grams of protein  Monitor for further diet advancement and ability to adjust nutrition supplements  NUTRITION DIAGNOSIS:   Moderate Malnutrition related to chronic illness (CLL, diverticulitis) as evidenced by moderate fat depletion, moderate muscle depletion, mild muscle depletion, energy intake < or equal to 75% for > or equal to 1 month. - ongoing  GOAL:   Patient will meet greater than or equal to 90% of their needs - goal met via TPN  MONITOR:   Diet advancement, Labs, Weight trends, I & O's  REASON FOR ASSESSMENT:   Consult New TPN/TNA  ASSESSMENT:   Pt admitted with enteritis. PMH significant for CLL, paroxysmal afib on eliquis, colon cancer s/p L hemicolectomy (10+ years ago), GERD.  5/15 - s/p exlap, lysis of adhesion, small bowel resection with primary anastomosis; TPN initiated 5/20 - diet advanced to clear liquids  Per flowsheet documentation, pt is oriented x1.   Wife present at bedside. Pt had only had a couple bites of jello and sips of juice this morning. She reports that he does not like the broth or orange juice. Preferences placed in meal ordering system. Agreeable to trying Boost breeze while on clear liquid diet.   Wife reports he has had intermittent nausea but no vomiting since admission.   Admission weight history: 5/12: 94.6 kg  5/21: 92.2 kg  Edema: mild pitting BLE  Medications: SSI 0-9 units q6h, melatonin  Labs reviewed  CBG's 131-179 x24 hours  Diet Order:   Diet Order             Diet clear liquid Room service appropriate? Yes; Fluid consistency: Thin  Diet effective now                   EDUCATION NEEDS:   Education needs have been  addressed  Skin:  Skin Assessment: Reviewed RN Assessment  Last BM:  5/14 (type 6 small)  Height:   Ht Readings from Last 1 Encounters:  12/18/22 6' 0.5" (1.842 m)    Weight:   Wt Readings from Last 1 Encounters:  12/24/22 92.2 kg   BMI:  Body mass index is 27.19 kg/m.  Estimated Nutritional Needs:   Kcal:  2400-2600  Protein:  120-135g  Fluid:  >/=2L  Drusilla Kanner, RDN, LDN Clinical Nutrition

## 2022-12-25 ENCOUNTER — Inpatient Hospital Stay (HOSPITAL_COMMUNITY): Payer: Medicare PPO

## 2022-12-25 DIAGNOSIS — I4811 Longstanding persistent atrial fibrillation: Secondary | ICD-10-CM | POA: Diagnosis not present

## 2022-12-25 DIAGNOSIS — K529 Noninfective gastroenteritis and colitis, unspecified: Secondary | ICD-10-CM | POA: Diagnosis not present

## 2022-12-25 LAB — HEMOGLOBIN AND HEMATOCRIT, BLOOD
HCT: 25.2 % — ABNORMAL LOW (ref 39.0–52.0)
Hemoglobin: 8 g/dL — ABNORMAL LOW (ref 13.0–17.0)

## 2022-12-25 LAB — GLUCOSE, CAPILLARY
Glucose-Capillary: 132 mg/dL — ABNORMAL HIGH (ref 70–99)
Glucose-Capillary: 144 mg/dL — ABNORMAL HIGH (ref 70–99)
Glucose-Capillary: 129 mg/dL — ABNORMAL HIGH (ref 70–99)
Glucose-Capillary: 128 mg/dL — ABNORMAL HIGH (ref 70–99)
Glucose-Capillary: 150 mg/dL — ABNORMAL HIGH (ref 70–99)

## 2022-12-25 LAB — TYPE AND SCREEN
Antibody Screen: NEGATIVE
Unit division: 0

## 2022-12-25 LAB — BASIC METABOLIC PANEL
Anion gap: 10 (ref 5–15)
BUN: 25 mg/dL — ABNORMAL HIGH (ref 8–23)
CO2: 23 mmol/L (ref 22–32)
Calcium: 8.7 mg/dL — ABNORMAL LOW (ref 8.9–10.3)
Chloride: 106 mmol/L (ref 98–111)
Creatinine, Ser: 0.74 mg/dL (ref 0.61–1.24)
GFR, Estimated: >60 mL/min (ref >60)
Glucose, Bld: 124 mg/dL — ABNORMAL HIGH (ref 70–99)
Potassium: 3.9 mmol/L (ref 3.5–5.1)
Sodium: 139 mmol/L (ref 135–145)

## 2022-12-25 LAB — AMMONIA: Ammonia: 25 umol/L (ref 9–35)

## 2022-12-25 LAB — CBC
HCT: 25.8 % — ABNORMAL LOW (ref 39.0–52.0)
Hemoglobin: 8.5 g/dL — ABNORMAL LOW (ref 13.0–17.0)
MCH: 32.4 pg (ref 26.0–34.0)
MCHC: 32.9 g/dL (ref 30.0–36.0)
MCV: 98.5 fL (ref 80.0–100.0)
Platelets: 186 10*3/uL (ref 150–400)
RBC: 2.62 MIL/uL — ABNORMAL LOW (ref 4.22–5.81)
RDW: 17.1 % — ABNORMAL HIGH (ref 11.5–15.5)
WBC: 6.4 10*3/uL (ref 4.0–10.5)
nRBC: 0.8 % — ABNORMAL HIGH (ref 0.0–0.2)

## 2022-12-25 LAB — HEPARIN LEVEL (UNFRACTIONATED): Heparin Unfractionated: 0.4 IU/mL (ref 0.30–0.70)

## 2022-12-25 MED ORDER — HALOPERIDOL LACTATE 5 MG/ML IJ SOLN
2.0000 mg | INTRAMUSCULAR | Status: DC | PRN
  Administered 2022-12-25 – 2022-12-29 (×10): 2 mg via INTRAVENOUS
  Filled 2022-12-25 (×11): qty 1

## 2022-12-25 MED ORDER — GERHARDT'S BUTT CREAM
TOPICAL_CREAM | Freq: Four times a day (QID) | CUTANEOUS | Status: DC
  Administered 2022-12-25 – 2022-12-31 (×9): 1 via TOPICAL
  Filled 2022-12-25 (×3): qty 1

## 2022-12-25 MED ORDER — METOPROLOL TARTRATE 50 MG PO TABS
75.0000 mg | ORAL_TABLET | Freq: Two times a day (BID) | ORAL | Status: DC
  Administered 2022-12-25 – 2023-01-03 (×17): 75 mg via ORAL
  Filled 2022-12-25 (×18): qty 1

## 2022-12-25 MED ORDER — METOPROLOL TARTRATE 25 MG PO TABS
25.0000 mg | ORAL_TABLET | Freq: Once | ORAL | Status: AC
  Administered 2022-12-25: 25 mg via ORAL
  Filled 2022-12-25: qty 1

## 2022-12-25 MED ORDER — TRAVASOL 10 % IV SOLN
INTRAVENOUS | Status: AC
  Filled 2022-12-25: qty 1254

## 2022-12-25 MED ORDER — HALOPERIDOL LACTATE 5 MG/ML IJ SOLN
2.0000 mg | Freq: Four times a day (QID) | INTRAMUSCULAR | Status: DC | PRN
  Administered 2022-12-25: 2 mg via INTRAVENOUS
  Filled 2022-12-25: qty 1

## 2022-12-25 NOTE — Progress Notes (Signed)
PROGRESS NOTE    Jonathon Parker [facility]  ZOX:[id number] DOB: 04/22/1951 DOA: 12/14/2022 PCP: Paulina Fusi, MD    Brief Narrative:  72 year old gentleman with history of CLL, paroxysmal A-fib on Eliquis, history of colon cancer and left hemicolectomy more than 10 years ago, GERD who was suffering from enteritis and recently on oral antibiotics came to the emergency room with worsening abdominal pain and confusion associated with watery stools.  Workup in the emergency room revealed perforated diverticulitis of the small bowel in the right upper quadrant.  Started on IV antibiotics and resuscitation.  Patient underwent surgical exploration on 5/15.  Postoperative recovery complicated with delirium, A-fib and prolonged ileus. 5/22, with florid delirium features.   Assessment & Plan:   Perforated diverticulitis of the small bowel: Exploratory laparotomy lysis of adhesion and small bowel resection with primary anastomosis 5/15 Prolonged ileus.  Now with return of bowel function. Remains on TPN due to poor oral intake, pharmacy managing. Continue mobility. Completed antibiotics after the procedure. Hopefully he can tolerate diet and can come off TPN next few days.  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 Patient currently remains on heparin perioperative, will change to Eliquis once there are no more surgical procedures needed and patient is eating regular diet. On Eliquis at home, currently remains on heparin perioperative. Seen by cardiology, started on metoprolol 50 mg twice daily, will slowly escalate as needed.  Control heart rate with injectable metoprolol. Echocardiogram with ejection fraction 55 to 60%.  Acute blood loss anemia on chronic macrocytic anemia: Baseline hemoglobin about 12. Postop hemoglobin 7.6-7.2.  Patient with active cardiovascular disease, will need hemoglobin more than 8.  Transfuse and monitor.   1 unit of  PRBC 5/21, hemoglobin 8.5 today.  Will continue to monitor.  Hospital-acquired delirium: Patient with underlying history of dementia.  He has developed significant tremulousness and confusion. Now with worsening delirium today, of slurred speech. Check ammonia levels. Head CT scan Discontinue all Seroquel, trazodone, Zoloft, melatonin. Will keep patient on haloperidol 2 mg IV every 6 hours as needed. Ongoing delirium of poor prognosis, will consult palliative care team to help management with the symptoms and also to define goal of care.  Depression, dementia without behavioral disturbances: On Wellbutrin, Aricept .  As above.  Obstructive sleep apnea: Use CPAP as tolerated.  Patient is usually declining.  DVT prophylaxis: Place and maintain sequential compression device Start: 12/16/22 1321   Code Status: Full code Family Communication: Wife at bedside Disposition Plan: Status is: Inpatient Remains inpatient appropriate because: Significant delirium, TPN, A-fib with RVR, heparin drip.     Consultants:  Cardiology Surgery Palliative care, consulted  Procedures:  Ex lap 5/50  Antimicrobials:  Completed Zosyn   Subjective:  Patient seen and examined.  Overnight events noted.  He remained confused and impulsive all day yesterday.  Later in the night, patient will be more impulsive, agitated.  His speech is slurred today and difficult to understand.  Moves all extremities.  Unable to keep up with any interaction.  Wife at the bedside. CT scan reviewed, shows old infarction.  No new changes. Patient had 2 melanotic bowel movement since yesterday.  Difficult to assess pain, however he looks more restless.    Objective: Vitals:   12/24/22 2028 12/25/22 0034 12/25/22 0404 12/25/22 0731  BP: 124/87 122/86 113/60 128/86  Pulse: 96 87 74 77  Resp: 19 14 17 15   Temp: 98.2 F (36.8 C) 98 F (36.7 C) 98.2 F (  36.8 C) 97.9 F (36.6 C)  TempSrc: Oral Oral Oral Axillary  SpO2:  96% 98% 100% 99%  Weight:   90.1 kg   Height:        Intake/Output Summary (Last 24 hours) at 12/25/2022 1018 Last data filed at 12/25/2022 0853 Gross per 24 hour  Intake 683 ml  Output 2550 ml  Net -1867 ml    Filed Weights   12/23/22 0400 12/24/22 0517 12/25/22 0404  Weight: 93.8 kg 92.2 kg 90.1 kg    Examination:  General: Sick looking.  Tremulous. Alert, startled, not oriented.  Speech is garbled.  Does not follow any commands. Does not have any focal neurological deficits.  Moves all extremities equally. Cardiovascular: S1-S2 normal.  Irregularly irregular.  Tachycardic. Respiratory: Bilateral clear.  No added sounds. Gastrointestinal: Soft.  Midline incision with intact staples.  Mild tenderness along the incision line.  No rigidity or guarding. Ext: No edema.     Data Reviewed: I have personally reviewed following labs and imaging studies  CBC: Recent Labs  Lab 12/21/22 0435 12/22/22 0540 12/23/22 0507 12/23/22 1229 12/24/22 0525 12/25/22 0418  WBC QUESTIONABLE RESULTS, RECOMMEND RECOLLECT TO VERIFY 6.1 4.9  --  6.9 6.4  HGB QUESTIONABLE RESULTS, RECOMMEND RECOLLECT TO VERIFY 8.5* 7.6* 8.2* 7.2* 8.5*  HCT QUESTIONABLE RESULTS, RECOMMEND RECOLLECT TO VERIFY 26.5* 23.9*  --  22.4* 25.8*  MCV QUESTIONABLE RESULTS, RECOMMEND RECOLLECT TO VERIFY 102.3* 103.5*  --  103.2* 98.5  PLT QUESTIONABLE RESULTS, RECOMMEND RECOLLECT TO VERIFY 209 191  --  217 186    Basic Metabolic Panel: Recent Labs  Lab 12/19/22 0900 12/20/22 0510 12/21/22 0550 12/22/22 0540 12/23/22 0507  NA 135 138 138 138 135  K 4.3 3.8 4.0 4.2 3.9  CL 101 100 100 101 101  CO2 24 27 28 28 27   GLUCOSE 137* 107* 124* 152* 176*  BUN 19 14 18 22 23   CREATININE 1.01 0.80 0.88 1.15 0.73  CALCIUM 8.7* 8.5* 8.9 8.7* 8.6*  MG 2.2 2.1 2.0 1.8 2.0  PHOS 2.5 2.4* 3.6 1.7* 2.8    GFR: Estimated Creatinine Clearance: 93 mL/min (by C-G formula based on SCr of 0.73 mg/dL). Liver Function  Tests: Recent Labs  Lab 12/19/22 0900 12/23/22 0507  AST 21 35  ALT 19 42  ALKPHOS 115 163*  BILITOT 1.0 0.8  PROT 6.1* 5.7*  ALBUMIN 2.8* 2.4*    No results for input(s): "LIPASE", "AMYLASE" in the last 168 hours. No results for input(s): "AMMONIA" in the last 168 hours. Coagulation Profile: No results for input(s): "INR", "PROTIME" in the last 168 hours. Cardiac Enzymes: No results for input(s): "CKTOTAL", "CKMB", "CKMBINDEX", "TROPONINI" in the last 168 hours. BNP (last 3 results) No results for input(s): "PROBNP" in the last 8760 hours. HbA1C: No results for input(s): "HGBA1C" in the last 72 hours. CBG: Recent Labs  Lab 12/24/22 0545 12/24/22 1144 12/24/22 2026 12/25/22 0031 12/25/22 0533  GLUCAP 131* 140* 116* 144* 132*    Lipid Profile: Recent Labs    12/23/22 0507  TRIG 116    Thyroid Function Tests: No results for input(s): "TSH", "T4TOTAL", "FREET4", "T3FREE", "THYROIDAB" in the last 72 hours. Anemia Panel: No results for input(s): "VITAMINB12", "FOLATE", "FERRITIN", "TIBC", "IRON", "RETICCTPCT" in the last 72 hours. Sepsis Labs: Recent Labs  Lab 12/20/22 2300  PROCALCITON 0.20     Recent Results (from the past 240 hour(s))  Gastrointestinal Panel by PCR , Stool     Status: None   Collection Time:  12/15/22 12:26 PM   Specimen: STOOL  Result Value Ref Range Status   Campylobacter species NOT DETECTED NOT DETECTED Final   Plesimonas shigelloides NOT DETECTED NOT DETECTED Final   Salmonella species NOT DETECTED NOT DETECTED Final   Yersinia enterocolitica NOT DETECTED NOT DETECTED Final   Vibrio species NOT DETECTED NOT DETECTED Final   Vibrio cholerae NOT DETECTED NOT DETECTED Final   Enteroaggregative E coli (EAEC) NOT DETECTED NOT DETECTED Final   Enteropathogenic E coli (EPEC) NOT DETECTED NOT DETECTED Final   Enterotoxigenic E coli (ETEC) NOT DETECTED NOT DETECTED Final   Shiga like toxin producing E coli (STEC) NOT DETECTED NOT DETECTED  Final   Shigella/Enteroinvasive E coli (EIEC) NOT DETECTED NOT DETECTED Final   Cryptosporidium NOT DETECTED NOT DETECTED Final   Cyclospora cayetanensis NOT DETECTED NOT DETECTED Final   Entamoeba histolytica NOT DETECTED NOT DETECTED Final   Giardia lamblia NOT DETECTED NOT DETECTED Final   Adenovirus F40/41 NOT DETECTED NOT DETECTED Final   Astrovirus NOT DETECTED NOT DETECTED Final   Norovirus GI/GII NOT DETECTED NOT DETECTED Final   Rotavirus A NOT DETECTED NOT DETECTED Final   Sapovirus (I, II, IV, and V) NOT DETECTED NOT DETECTED Final    Comment: Performed at [facility], [address] [place], [place] [address]  C Difficile Quick Screen w PCR reflex     Status: None   Collection Time: 12/15/22 12:26 PM   Specimen: STOOL  Result Value Ref Range Status   C Diff antigen NEGATIVE NEGATIVE Final   C Diff toxin NEGATIVE NEGATIVE Final   C Diff interpretation No C. difficile detected.  Final    Comment: Performed at [facility], [address] [place], [place] [address]  Surgical pcr screen     Status: None   Collection Time: 12/18/22  9:02 AM   Specimen: Nasal Mucosa; Nasal Swab  Result Value Ref Range Status   MRSA, PCR NEGATIVE NEGATIVE Final   Staphylococcus aureus NEGATIVE NEGATIVE Final    Comment: (NOTE) The Xpert SA Assay (FDA approved for NASAL specimens in patients 27 years of age and older), is one component of a comprehensive surveillance program. It is not intended to diagnose infection nor to guide or monitor treatment. Performed at [facility], [address] [place], [place] [address]          Radiology Studies: CT HEAD WO CONTRAST ( )  Result Date: 12/25/2022 CLINICAL DATA:  Provided history: Mental status change, unknown cause. EXAM: CT HEAD WITHOUT CONTRAST TECHNIQUE: Contiguous axial images were obtained from the base of the skull through the vertex without intravenous contrast. RADIATION DOSE REDUCTION: This exam was  performed according to the departmental dose-optimization program which includes automated exposure control, adjustment of the mA and/or kV according to patient size and/or use of iterative reconstruction technique. COMPARISON:  Head CT 12/15/2022.  Brain MRI 11/25/2020. FINDINGS: Mildly motion degraded exam. Brain: Generalized cerebral atrophy. 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 Mild chronic small vessel ischemic changes within the cerebral white matter, also better appreciated on the prior MRI. There is no acute intracranial hemorrhage. No demarcated cortical infarct. No extra-axial fluid collection. No evidence of an intracranial mass. No midline shift. Vascular: No hyperdense vessel.  Atherosclerotic calcifications. Skull: No fracture or aggressive osseous lesion. Sinuses/Orbits: No mass or acute finding within the imaged orbits. Mild mucosal thickening within the right maxillary  sinus. Minimal mucosal thickening within the left maxillary, bilateral sphenoid and bilateral ethmoid sinuses. IMPRESSION: 1. Mildly motion degraded exam. 2. No evidence of an acute intracranial abnormality. 3. 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 4. Cerebral atrophy and mild cerebral white matter chronic small vessel disease. 5. Mild paranasal sinus mucosal thickening. Electronically Signed   By: Jackey Loge D.O.   On: 12/25/2022 09:53        Scheduled Meds:  atorvastatin  20 mg Oral Daily   buPROPion  300 mg Oral Daily   Chlorhexidine Gluconate Cloth  6 each Topical Daily   colestipol  1 g Oral Daily   donepezil  5 mg Oral Daily   feeding supplement  1 Container Oral TID BM   insulin aspart  0-9 Units Subcutaneous Q6H   metoprolol tartrate  50 mg Oral BID   scopolamine  1 patch Transdermal Q72H   sodium chloride flush  10-40 mL Intracatheter Q12H   Continuous Infusions:  sodium chloride 10 mL/hr at  12/19/22 2022   heparin 1,450 Units/hr (12/25/22 0405)   methocarbamol (ROBAXIN) IV 1,000 mg (12/25/22 0535)   TPN ADULT (ION) 95 mL/hr at 12/24/22 1748   TPN ADULT (ION)       LOS: 10 days    Time spent: 40 minutes     Dorcas Carrow, MD [facility] Pager [phone]

## 2022-12-25 NOTE — TOC Progression Note (Signed)
Transition of Care [facility]) - Progression Note    Patient Details  Name: Cauy Bussell MRN: [id number] Date of Birth: [birth year]  Transition of Care [facility]) CM/SW Contact  Leander Rams, LCSW Phone Number: 12/25/2022, 4:08 PM  Clinical Narrative:    CSW uploaded additional documents to [place] MUST for PASRR.   TOC will continue to follow.    Expected Discharge Plan: Skilled Nursing Facility Barriers to Discharge: Continued Medical Work up  Expected Discharge Plan and Services In-house Referral: Clinical Social Work     Living arrangements for the past 2 months: Single Family Home                                       Social Determinants of Health (SDOH) Interventions SDOH Screenings   Food Insecurity: No Food Insecurity (12/15/2022)  Housing: Low Risk  (12/15/2022)  Transportation Needs: No Transportation Needs (12/15/2022)  Utilities: Not At Risk (12/15/2022)  Tobacco Use: Low Risk  (12/19/2022)    Readmission Risk Interventions     No data to display         Oletta Lamas, MSW, LCSWA, LCASA Transitions of Care  Clinical Social Worker I

## 2022-12-25 NOTE — Progress Notes (Signed)
OT Cancellation Note  Patient Details Name: Jonathon Parker [facility] MRN: [id number] DOB: [birth year]   Cancelled Treatment:    Reason Eval/Treat Not Completed: Medical issues which prohibited therapy (Pt with increased confusion, unable to participate in therapy per RN.)  Evern Bio 12/25/2022, 9:14 AM Berna Spare, OTR/L Acute Rehabilitation Services Office: [phone]

## 2022-12-25 NOTE — Progress Notes (Addendum)
PHARMACY - TOTAL PARENTERAL NUTRITION CONSULT NOTE  Indication: Small bowel diverticula perforation   Patient Measurements: Height: 6' 0.5" (184.2 cm) Weight: 90.1 kg (198 lb 10.2 oz) IBW/kg (Calculated) : 78.75 TPN AdjBW (KG): 95.3 Body mass index is 26.57 kg/m.  Assessment:  47 YOM presented to [facility] on 5/11 with fevers, abdominal pain and confusion.  PMH significant for CLL, and colon cancer post L hemicolectomy.  Recent history of pacemaker placement for Afib/brady and enteritis.  CT showed small bowel diverticulitis vs enteritis vs SBO with ischemia.  Patient was on a CLD on 5/13 and began having increased abdominal pain.  Surgery plans to perform an ex-lap with SBR.  Patient reports eating two meals a day and intake was reduced to ~50% given his abdominal pain that started months ago.  Patient feels that he lost weight but is unsure of how much.  Glucose / Insulin: no hx DM - CBGs < 180 - 2 units SSI Electrolytes: Na 139, K 3.9, Ca 8.7 [CoCa 9.98] , Mag 2.0, Phos 2.8 Renal: SCr 0.74, BUN WNL Hepatic: AST/ALT WNL, Alk Phos 163, tbili normalized, albumin 2.4 Intake / Output; MIVF: UOP 0.79 ml/kg/hr + 1 unmeasured urine, +1 unmeasured stool, NG 0 mL GI Imaging: 5/19 portable X-ray shows nonobstructive bowel gas pattern GI Surgeries / Procedures:  5/15 ex-lap with LoA, SBR with primary anastomosis  Central access: PICC placed 12/17/22 TPN start date: 12/18/22  Nutritional Goals: RD Estimated Needs Total Energy Estimated Needs: 2400-2600 Total Protein Estimated Needs: 120-135g Total Fluid Estimated Needs: >/=2L  Current Nutrition:  TPN  Plan:  Continue TPN at 95 mL/hr at 1800 (goal rate 95 ml/hr) Electrolytes in TPN: Na 85 mEq/L, K 38 mEq/L, Ca 5 mEq/L, Mg 5 mEq/L, Phos 22 mmol/L, Cl:Ac 1:1 Add standard MVI and trace elements to TPN Continue moderate SSI Q6H Standard TPN labs on Mon and Thurs  Cortavious Nix BS, PharmD, BCPS Clinical Pharmacist 12/25/2022 10:32  AM  Contact: [phone] after 3 PM  "Be curious, not judgmental..." -Debbora Dus

## 2022-12-25 NOTE — Progress Notes (Signed)
7 Days Post-Op   Subjective/Chief Complaint: Confused today, acute change. Speaking some words that are difficult to understand   Objective: Vital signs in last 24 hours: Temp:  [97.5 F (36.4 C)-98.7 F (37.1 C)] 97.9 F (36.6 C) (05/22 0731) Pulse Rate:  [74-97] 77 (05/22 0731) Resp:  [14-22] 15 (05/22 0731) BP: (111-128)/(54-91) 128/86 (05/22 0731) SpO2:  [96 %-100 %] 99 % (05/22 0731) Weight:  [90.1 kg] 90.1 kg (05/22 0404) Last BM Date : 12/18/22  Intake/Output from previous day: 05/21 0701 - 05/22 0700 In: 683 [Blood:683] Out: 1700 [Urine:1700] Intake/Output this shift: No intake/output data recorded.  General appearance: delirious and confused with unintelligible words Resp: clear to auscultation bilaterally Cardio: irregularly irregular rhythm GI: soft, minimal tenderness. Good bs  Lab Results:  Recent Labs    12/24/22 0525 12/25/22 0418  WBC 6.9 6.4  HGB 7.2* 8.5*  HCT 22.4* 25.8*  PLT 217 186   BMET Recent Labs    12/23/22 0507  NA 135  K 3.9  CL 101  CO2 27  GLUCOSE 176*  BUN 23  CREATININE 0.73  CALCIUM 8.6*   PT/INR No results for input(s): "LABPROT", "INR" in the last 72 hours. ABG No results for input(s): "PHART", "HCO3" in the last 72 hours.  Invalid input(s): "PCO2", "PO2"  Studies/Results: No results found.  Anti-infectives: Anti-infectives (From admission, onward)    Start     Dose/Rate Route Frequency Ordered Stop   12/19/22 2000  piperacillin-tazobactam (ZOSYN) IVPB 3.375 g        3.375 g 12.5 mL/hr over 240 Minutes Intravenous Every 8 hours 12/19/22 1550 12/23/22 2359   12/16/22 1100  piperacillin-tazobactam (ZOSYN) IVPB 3.375 g  Status:  Discontinued        3.375 g 12.5 mL/hr over 240 Minutes Intravenous Every 8 hours 12/16/22 0954 12/19/22 1550   12/16/22 0400  ampicillin-sulbactam (UNASYN) 1.5 g in sodium chloride 0.9 % 100 mL IVPB  Status:  Discontinued        1.5 g 200 mL/hr over 30 Minutes Intravenous Every 6  hours 12/16/22 0258 12/16/22 0855   12/15/22 1630  piperacillin-tazobactam (ZOSYN) IVPB 3.375 g  Status:  Discontinued        3.375 g 12.5 mL/hr over 240 Minutes Intravenous Every 8 hours 12/15/22 1528 12/16/22 0307   12/15/22 1000  azithromycin (ZITHROMAX) tablet 500 mg  Status:  Discontinued        500 mg Oral Daily 12/15/22 0900 12/16/22 0258   12/15/22 0045  piperacillin-tazobactam (ZOSYN) IVPB 3.375 g        3.375 g 12.5 mL/hr over 240 Minutes Intravenous  Once 12/15/22 0044 12/15/22 0446       Assessment/Plan: s/p Procedure(s): EXPLORATORY LAPAROTOMY, BOWEL RESECTION (N/A) Acute mental status change. Will check head ct this am Correct lytes Hold on advancing diet until mental status clears Continue tpn Afib per cards POD 7 s/p Exploratory laparotomy, lysis of adhesions, small bowel resection with primary anastomosis for Contained perforation of jejunal diverticulum by Dr. Corliss Skains on 12/18/22 - Path benign - NG removed by patient, will try without it. If he has n/v needs replaced (discussed with nurse) - Cont TPN - IV Tylenol and Robaxin while NGT in place. PRN dilaudid.  - Cont abx, plan 5d post op - Mobilize - PT/OT - Pulm toilet, IS, wean o2 as able -afib. Cardizem drip per Cardiology. Rate needs to be controlled before he will participate in therapy   FEN - NPO, PICC/TPN VTE - SCDs,  heparin gtt.  ID - Zosyn Foley - Out POD 1, voiding.     CLL (last chemo in November but on venetoclax)  Recent resp issues - bronchitis, pneumonia, RSV  A. Fib/Bradycardia - pacemaker placement about a month ago   LOS: 10 days    Jonathon Parker 12/25/2022

## 2022-12-25 NOTE — Progress Notes (Signed)
Physical Therapy Discharge Patient Details Name: Thomas Buitrago [facility] MRN: [id number] DOB: Dec 25, 1950 Today's Date: 12/25/2022 Time:  -     Patient discharged from PT services secondary to medical decline - will need to re-order PT to resume therapy services.  Please see latest therapy progress note for current level of functioning and progress toward goals.    Progress and discharge plan discussed with patient and/or caregiver: Patient/Caregiver agrees with plan  GP     Gladys Damme 12/25/2022, 8:01 AM

## 2022-12-25 NOTE — Progress Notes (Signed)
ANTICOAGULATION CONSULT NOTE  Pharmacy Consult for Heparin (Eliquis on hold) Indication: atrial fibrillation  Allergies  Allergen Reactions   Lisinopril Cough   Zolpidem Other (See Comments)    "Got me in outer space"     Patient Measurements: Height: 6' 0.5" (184.2 cm) Weight: 90.1 kg (198 lb 10.2 oz) IBW/kg (Calculated) : 78.75 Heparin Dosing Weight: 90 kg  Vital Signs: Temp: 97.9 F (36.6 C) (05/22 0731) Temp Source: Axillary (05/22 0731) BP: 128/86 (05/22 0731) Pulse Rate: 77 (05/22 0731)  Labs: Recent Labs    12/23/22 0507 12/23/22 1229 12/24/22 0525 12/25/22 0418 12/25/22 0946  HGB 7.6*   < > 7.2* 8.5*  --   HCT 23.9*  --  22.4* 25.8*  --   PLT 191  --  217 186  --   HEPARINUNFRC 0.58  --  0.57  --  0.40  CREATININE 0.73  --   --   --   --    < > = values in this interval not displayed.     Estimated Creatinine Clearance: 93 mL/min (by C-G formula based on SCr of 0.73 mg/dL).  Assessment: 72 yr old male on Eliquis 5 mg BID prior to admission for atrial fibrillation.  Eliquis stopped after 8am dose on 5/13 for possible surgery. Pharmacy dosing heparin while he is NPO.  -Heparin continues to be therapeutic. Pt s/p bowel surgery on TPN -hg= 8.5  Goal of Therapy:  Heparin level 0.3-0.7 units/ml Monitor platelets by anticoagulation protocol: Yes   Plan:  Continue heparin infusion at 1450 units/hr Check heparin level daily while on heparin Continue to monitor H&H and platelets  Harland German, PharmD Clinical Pharmacist **Pharmacist phone directory can now be found on [email or web address] ([email or web address]).  Listed under [facility].

## 2022-12-25 NOTE — Progress Notes (Signed)
PT Cancellation Note  Patient Details Name: Jonathon Parker MRN: [id number] DOB: April 26, 1951   Cancelled Treatment:    Reason Eval/Treat Not Completed: Medical issues which prohibited therapy (Reached out to RN who stated that pt is delirious and unable to participate in therapy at this time. PT will sign off at this time. Reconsult PT when medical status improves.)   Gladys Damme 12/25/2022, 8:01 AM

## 2022-12-26 DIAGNOSIS — I4811 Longstanding persistent atrial fibrillation: Secondary | ICD-10-CM | POA: Diagnosis not present

## 2022-12-26 DIAGNOSIS — K529 Noninfective gastroenteritis and colitis, unspecified: Secondary | ICD-10-CM | POA: Diagnosis not present

## 2022-12-26 LAB — GLUCOSE, CAPILLARY: Glucose-Capillary: 108 mg/dL — ABNORMAL HIGH (ref 70–99)

## 2022-12-26 LAB — CBC
HCT: 25.6 % — ABNORMAL LOW (ref 39.0–52.0)
Hemoglobin: 8.6 g/dL — ABNORMAL LOW (ref 13.0–17.0)
MCH: 33.1 pg (ref 26.0–34.0)
MCHC: 33.6 g/dL (ref 30.0–36.0)
MCV: 98.5 fL (ref 80.0–100.0)
Platelets: 176 10*3/uL (ref 150–400)
RBC: 2.6 MIL/uL — ABNORMAL LOW (ref 4.22–5.81)
RDW: 16.2 % — ABNORMAL HIGH (ref 11.5–15.5)
WBC: 7.5 10*3/uL (ref 4.0–10.5)
nRBC: 0.4 % — ABNORMAL HIGH (ref 0.0–0.2)

## 2022-12-26 LAB — HEPARIN LEVEL (UNFRACTIONATED): Heparin Unfractionated: 0.36 IU/mL (ref 0.30–0.70)

## 2022-12-26 LAB — COMPREHENSIVE METABOLIC PANEL
ALT: 45 U/L — ABNORMAL HIGH (ref 0–44)
AST: 27 U/L (ref 15–41)
Albumin: 3 g/dL — ABNORMAL LOW (ref 3.5–5.0)
Alkaline Phosphatase: 192 U/L — ABNORMAL HIGH (ref 38–126)
Anion gap: 7 (ref 5–15)
BUN: 22 mg/dL (ref 8–23)
CO2: 22 mmol/L (ref 22–32)
Calcium: 8.8 mg/dL — ABNORMAL LOW (ref 8.9–10.3)
Chloride: 106 mmol/L (ref 98–111)
Creatinine, Ser: 0.78 mg/dL (ref 0.61–1.24)
GFR, Estimated: >60 mL/min (ref >60)
Glucose, Bld: 110 mg/dL — ABNORMAL HIGH (ref 70–99)
Potassium: 4.2 mmol/L (ref 3.5–5.1)
Sodium: 135 mmol/L (ref 135–145)
Total Bilirubin: 0.8 mg/dL (ref 0.3–1.2)
Total Protein: 5.8 g/dL — ABNORMAL LOW (ref 6.5–8.1)

## 2022-12-26 LAB — PREPARE RBC (CROSSMATCH)

## 2022-12-26 LAB — BPAM RBC: Unit Type and Rh: 6200

## 2022-12-26 LAB — TYPE AND SCREEN: Unit division: 0

## 2022-12-26 LAB — MAGNESIUM: Magnesium: 2.1 mg/dL (ref 1.7–2.4)

## 2022-12-26 LAB — PHOSPHORUS: Phosphorus: 3.9 mg/dL (ref 2.5–4.6)

## 2022-12-26 MED ORDER — ENOXAPARIN SODIUM 100 MG/ML IJ SOSY
90.0000 mg | PREFILLED_SYRINGE | Freq: Two times a day (BID) | INTRAMUSCULAR | Status: DC
  Administered 2022-12-26 (×2): 90 mg via SUBCUTANEOUS
  Filled 2022-12-26 (×2): qty 1

## 2022-12-26 MED ORDER — ALUM & MAG HYDROXIDE-SIMETH 200-200-20 MG/5ML PO SUSP
30.0000 mL | Freq: Once | ORAL | Status: DC
  Filled 2022-12-26: qty 30

## 2022-12-26 MED ORDER — TRAVASOL 10 % IV SOLN
INTRAVENOUS | Status: AC
  Filled 2022-12-26: qty 1254

## 2022-12-26 MED ORDER — HALOPERIDOL 1 MG PO TABS
2.0000 mg | ORAL_TABLET | Freq: Every morning | ORAL | Status: DC
  Administered 2022-12-26 – 2022-12-28 (×3): 2 mg via ORAL
  Filled 2022-12-26 (×3): qty 2

## 2022-12-26 MED ORDER — ACETAMINOPHEN 500 MG PO TABS
1000.0000 mg | ORAL_TABLET | Freq: Four times a day (QID) | ORAL | Status: AC
  Administered 2022-12-26 (×3): 1000 mg via ORAL
  Filled 2022-12-26 (×3): qty 2

## 2022-12-26 MED ORDER — SODIUM CHLORIDE 0.9% IV SOLUTION: Freq: Once | INTRAVENOUS | Status: AC

## 2022-12-26 MED ORDER — TRAZODONE HCL 50 MG PO TABS
50.0000 mg | ORAL_TABLET | Freq: Every day | ORAL | Status: DC
  Administered 2022-12-26: 50 mg via ORAL
  Filled 2022-12-26: qty 1

## 2022-12-26 MED ORDER — HALOPERIDOL 5 MG PO TABS
5.0000 mg | ORAL_TABLET | Freq: Every day | ORAL | Status: DC
  Administered 2022-12-26 – 2022-12-27 (×2): 5 mg via ORAL
  Filled 2022-12-26 (×2): qty 1

## 2022-12-26 MED ORDER — PANTOPRAZOLE SODIUM 40 MG IV SOLR
40.0000 mg | Freq: Every day | INTRAVENOUS | Status: DC
  Administered 2022-12-27 – 2023-01-02 (×7): 40 mg via INTRAVENOUS
  Filled 2022-12-26 (×7): qty 10

## 2022-12-26 NOTE — Progress Notes (Signed)
ANTICOAGULATION CONSULT NOTE  Pharmacy Consult for Lovenox (Eliquis on hold) Indication: atrial fibrillation  Allergies  Allergen Reactions   Lisinopril Cough   Zolpidem Other (See Comments)    "Got me in outer space"     Patient Measurements: Height: 6' 0.5" (184.2 cm) Weight: 87.5 kg (192 lb 14.4 oz) IBW/kg (Calculated) : 78.75  Vital Signs: Temp: 97.6 F (36.4 C) (05/23 0551) Temp Source: Oral (05/23 0551) BP: 150/90 (05/23 0551) Pulse Rate: 95 (05/23 0551)  Labs: Recent Labs    12/24/22 0525 12/25/22 0418 12/25/22 0946 12/25/22 0947 12/25/22 2050 12/26/22 0550  HGB 7.2* 8.5*  --   --  8.0* 8.6*  HCT 22.4* 25.8*  --   --  25.2* 25.6*  PLT 217 186  --   --   --  176  HEPARINUNFRC 0.57  --  0.40  --   --  0.36  CREATININE  --   --   --  0.74  --  0.78     Estimated Creatinine Clearance: 93 mL/min (by C-G formula based on SCr of 0.78 mg/dL).  Assessment: 72 yr old male on Eliquis 5 mg BID prior to admission for atrial fibrillation.  Last dose of Eliquis 5/13 at 0800.  Patient was transitioned to heparin for ex-lap / bowel resection surgery 5/15.  Clinically improving, slowly advancing diet.  Pharmacy has now been consulted to switch heparin to treatment dose Lovenox for afib.    Hgb 8.6 s/p 1 unit PRBC 5/21  Goal of Therapy:  Anti-Xa level 0.6-1 units/ml 4hrs after LMWH dose given Monitor platelets by anticoagulation protocol: Yes   Plan:  Discontinue heparin Start Lovenox 90mg  SQ q12h Continue to monitor H&H and platelets  [employer], Pharm.D., BCPS Clinical Pharmacist Clinical phone for 12/26/2022 from 7:30-3:00 is [phone].  **Pharmacist phone directory can be found on [email or web address] listed under [facility].  12/26/2022 8:53 AM

## 2022-12-26 NOTE — Progress Notes (Signed)
PROGRESS NOTE    Jonathon Parker [facility]  WRU:[id number] DOB: 1950-08-29 DOA: 12/14/2022 PCP: Paulina Fusi, MD    Brief Narrative:  72 year old gentleman with history of CLL, paroxysmal A-fib on Eliquis, history of colon cancer and left hemicolectomy more than 10 years ago, GERD who was suffering from enteritis and recently on oral antibiotics came to the emergency room with worsening abdominal pain and confusion associated with watery stools.  Workup in the emergency room revealed perforated diverticulitis of the small bowel in the right upper quadrant.  Started on IV antibiotics and resuscitation.  Patient underwent surgical exploration on 5/15.  Postoperative recovery complicated with delirium, A-fib and prolonged ileus. 5/22, with florid delirium features. 5/23, some improvement of symptoms.  Using haloperidol.   Assessment & Plan:   Perforated diverticulitis of the small bowel: Exploratory laparotomy lysis of adhesion and small bowel resection with primary anastomosis 5/15 Prolonged ileus.  Now with return of bowel function. Remains on TPN due to poor oral intake, pharmacy managing. Continue mobility. Completed antibiotics after the procedure. Hopefully he can tolerate diet and can come off TPN next few days. Full liquid diet today.  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 Patient currently remains on heparin perioperative,  Lovenox today.  Ultimately Eliquis if no further drop in hemoglobin tomorrow.   Cardiology following.  On metoprolol. control heart rate with injectable metoprolol. Echocardiogram with ejection fraction 55 to 60%.  Acute blood loss anemia on chronic macrocytic anemia: Baseline hemoglobin about 12. Postop hemoglobin 7.6-7.2-1 unit PRBC 8-will transfuse 1 unit further today to keep Hb >9.  Hospital-acquired delirium: Patient with underlying history of dementia.  He has developed significant tremulousness  and confusion. Florid delirium 5/22 responded to haloperidol. CT head was essentially normal.  Ammonia was normal. Since he responded well to haloperidol will discharge patient on albuterol 2.5 mg in the morning, 5 mg in the night.  Continue as needed.  Start low-dose trazodone at night that he takes chronically.   Ongoing delirium of poor prognosis, will consult palliative care team to help management with the symptoms and also to define goal of care.  Depression, dementia without behavioral disturbances: On Wellbutrin, Aricept .  As above.  Obstructive sleep apnea: Use CPAP as tolerated.  Patient is usually declining.  Start mobilizing with PT OT.  DVT prophylaxis: Place and maintain sequential compression device Start: 12/16/22 1321   Code Status: Full code Family Communication: Wife and daughter-in-law at the bedside. Disposition Plan: Status is: Inpatient Remains inpatient appropriate because: Significant delirium, TPN, A-fib with RVR, heparin drip.     Consultants:  Cardiology Surgery Palliative care, consulted  Procedures:  Ex lap 5/50  Antimicrobials:  Completed Zosyn   Subjective:  Patient seen and examined.  He used 3 doses of haloperidol since yesterday.  He responded very well and became comfortable after the injections.  He had episodes of delirium, picking up clothes and confusion.  On my interview today, patient looks fairly comfortable and was able to reasonably keep up with conversation. Noted 4 dark stools last 24 hours.   Objective: Vitals:   12/26/22 0551 12/26/22 0805 12/26/22 1043 12/26/22 1058  BP: (!) 150/90 138/65 (!) 149/88 (!) 156/94  Pulse: 95 (!) 106 85 89  Resp: 20 20 18 16   Temp: 97.6 F (36.4 C) 97.6 F (36.4 C) 98.6 F (37 C) 98.2 F (36.8 C)  TempSrc: Oral Oral Oral Oral  SpO2: 99% 100% 97% 98%  Weight:  Height:        Intake/Output Summary (Last 24 hours) at 12/26/2022 1108 Last data filed at 12/26/2022 0900 Gross per 24  hour  Intake 6827.12 ml  Output 2202 ml  Net 4625.12 ml    Filed Weights   12/24/22 0517 12/25/22 0404 12/26/22 0500  Weight: 92.2 kg 90.1 kg 87.5 kg    Examination:  General: Sick looking.  Tremulous.  Today he is alert awake.  He is oriented to himself and family.  He is oriented to time.  Easily distractible.  Speech is clear.  Follows commands.  Moves all extremities equally. Does not have any focal neurological deficits.  Moves all extremities equally. Cardiovascular: S1-S2 normal.  Irregularly irregular.  Tachycardic. Respiratory: Bilateral clear.  No added sounds. Gastrointestinal: Soft.  Midline incision with intact staples.  Mild tenderness along the incision line.  No rigidity or guarding. Ext: No edema.     Data Reviewed: I have personally reviewed following labs and imaging studies  CBC: Recent Labs  Lab 12/22/22 0540 12/23/22 0507 12/23/22 1229 12/24/22 0525 12/25/22 0418 12/25/22 2050 12/26/22 0550  WBC 6.1 4.9  --  6.9 6.4  --  7.5  HGB 8.5* 7.6* 8.2* 7.2* 8.5* 8.0* 8.6*  HCT 26.5* 23.9*  --  22.4* 25.8* 25.2* 25.6*  MCV 102.3* 103.5*  --  103.2* 98.5  --  98.5  PLT 209 191  --  217 186  --  176    Basic Metabolic Panel: Recent Labs  Lab 12/20/22 0510 12/21/22 0550 12/22/22 0540 12/23/22 0507 12/25/22 0947 12/26/22 0550  NA 138 138 138 135 139 135  K 3.8 4.0 4.2 3.9 3.9 4.2  CL 100 100 101 101 106 106  CO2 27 28 28 27 23 22   GLUCOSE 107* 124* 152* 176* 124* 110*  BUN 14 18 22 23  25* 22  CREATININE 0.80 0.88 1.15 0.73 0.74 0.78  CALCIUM 8.5* 8.9 8.7* 8.6* 8.7* 8.8*  MG 2.1 2.0 1.8 2.0  --  2.1  PHOS 2.4* 3.6 1.7* 2.8  --  3.9    GFR: Estimated Creatinine Clearance: 93 mL/min (by C-G formula based on SCr of 0.78 mg/dL). Liver Function Tests: Recent Labs  Lab 12/23/22 0507 12/26/22 0550  AST 35 27  ALT 42 45*  ALKPHOS 163* 192*  BILITOT 0.8 0.8  PROT 5.7* 5.8*  ALBUMIN 2.4* 3.0*    No results for input(s): "LIPASE", "AMYLASE"  in the last 168 hours. Recent Labs  Lab 12/25/22 0946  AMMONIA 25   Coagulation Profile: No results for input(s): "INR", "PROTIME" in the last 168 hours. Cardiac Enzymes: No results for input(s): "CKTOTAL", "CKMB", "CKMBINDEX", "TROPONINI" in the last 168 hours. BNP (last 3 results) No results for input(s): "PROBNP" in the last 8760 hours. HbA1C: No results for input(s): "HGBA1C" in the last 72 hours. CBG: Recent Labs  Lab 12/25/22 0533 12/25/22 1122 12/25/22 1542 12/25/22 2309 12/26/22 0600  GLUCAP 132* 129* 128* 150* 108*    Lipid Profile: No results for input(s): "CHOL", "HDL", "LDLCALC", "TRIG", "CHOLHDL", "LDLDIRECT" in the last 72 hours.  Thyroid Function Tests: No results for input(s): "TSH", "T4TOTAL", "FREET4", "T3FREE", "THYROIDAB" in the last 72 hours. Anemia Panel: No results for input(s): "VITAMINB12", "FOLATE", "FERRITIN", "TIBC", "IRON", "RETICCTPCT" in the last 72 hours. Sepsis Labs: Recent Labs  Lab 12/20/22 2300  PROCALCITON 0.20     Recent Results (from the past 240 hour(s))  Surgical pcr screen     Status: None   Collection Time:  12/18/22  9:02 AM   Specimen: Nasal Mucosa; Nasal Swab  Result Value Ref Range Status   MRSA, PCR NEGATIVE NEGATIVE Final   Staphylococcus aureus NEGATIVE NEGATIVE Final    Comment: (NOTE) The Xpert SA Assay (FDA approved for NASAL specimens in patients 65 years of age and older), is one component of a comprehensive surveillance program. It is not intended to diagnose infection nor to guide or monitor treatment. Performed at [facility], [address] [place], [place] [address]          Radiology Studies: CT HEAD WO CONTRAST ( )  Result Date: 12/25/2022 CLINICAL DATA:  Provided history: Mental status change, unknown cause. EXAM: CT HEAD WITHOUT CONTRAST TECHNIQUE: Contiguous axial images were obtained from the base of the skull through the vertex without intravenous contrast. RADIATION DOSE  REDUCTION: This exam was performed according to the departmental dose-optimization program which includes automated exposure control, adjustment of the mA and/or kV according to patient size and/or use of iterative reconstruction technique. COMPARISON:  Head CT 12/15/2022.  Brain MRI 11/25/2020. FINDINGS: Mildly motion degraded exam. Brain: Generalized cerebral atrophy. 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 Mild chronic small vessel ischemic changes within the cerebral white matter, also better appreciated on the prior MRI. There is no acute intracranial hemorrhage. No demarcated cortical infarct. No extra-axial fluid collection. No evidence of an intracranial mass. No midline shift. Vascular: No hyperdense vessel.  Atherosclerotic calcifications. Skull: No fracture or aggressive osseous lesion. Sinuses/Orbits: No mass or acute finding within the imaged orbits. Mild mucosal thickening within the right maxillary sinus. Minimal mucosal thickening within the left maxillary, bilateral sphenoid and bilateral ethmoid sinuses. IMPRESSION: 1. Mildly motion degraded exam. 2. No evidence of an acute intracranial abnormality. 3. 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 4. Cerebral atrophy and mild cerebral white matter chronic small vessel disease. 5. Mild paranasal sinus mucosal thickening. Electronically Signed   By: Jackey Loge D.O.   On: 12/25/2022 09:53        Scheduled Meds:  sodium chloride   Intravenous Once   acetaminophen  1,000 mg Oral Q6H   atorvastatin  20 mg Oral Daily   buPROPion  300 mg Oral Daily   Chlorhexidine Gluconate Cloth  6 each Topical Daily   colestipol  1 g Oral Daily   donepezil  5 mg Oral Daily   enoxaparin (LOVENOX) injection  90 mg Subcutaneous Q12H   feeding supplement  1 Container Oral TID BM   Gerhardt's butt cream   Topical QID   haloperidol  2 mg Oral q morning    haloperidol  5 mg Oral q1800   metoprolol tartrate  75 mg Oral BID   scopolamine  1 patch Transdermal Q72H   sodium chloride flush  10-40 mL Intracatheter Q12H   traZODone  50 mg Oral QHS   Continuous Infusions:  sodium chloride 10 mL/hr at 12/26/22 0307   methocarbamol (ROBAXIN) IV 1,000 mg (12/26/22 0610)   TPN ADULT (ION) Stopped (12/26/22 0303)   TPN ADULT (ION)       LOS: 11 days    Time spent: 40 minutes     Dorcas Carrow, MD [facility] Pager [phone]

## 2022-12-26 NOTE — Progress Notes (Signed)
8 Days Post-Op   Subjective/Chief Complaint: More alert today.  Denies abdominal pain.   Objective: Vital signs in last 24 hours: Temp:  [97.6 F (36.4 C)-98.8 F (37.1 C)] 97.6 F (36.4 C) (05/23 0551) Pulse Rate:  [70-102] 95 (05/23 0551) Resp:  [18-24] 20 (05/23 0551) BP: (133-150)/(72-100) 150/90 (05/23 0551) SpO2:  [97 %-100 %] 99 % (05/23 0551) Weight:  [87.5 kg] 87.5 kg (05/23 0500) Last BM Date : 12/25/22  Intake/Output from previous day: 05/22 0701 - 05/23 0700 In: 6587.1 [P.O.:120; I.V.:4467.2; IV Piggyback:1999.9] Out: 3052 [Urine:3050; Stool:2] Intake/Output this shift: No intake/output data recorded.  General appearance: alert and cooperative Resp: clear to auscultation bilaterally Cardio: irregularly irregular rhythm GI: soft, nontender. Having bm's  Lab Results:  Recent Labs    12/25/22 0418 12/25/22 2050 12/26/22 0550  WBC 6.4  --  7.5  HGB 8.5* 8.0* 8.6*  HCT 25.8* 25.2* 25.6*  PLT 186  --  176   BMET Recent Labs    12/25/22 0947 12/26/22 0550  NA 139 135  K 3.9 4.2  CL 106 106  CO2 23 22  GLUCOSE 124* 110*  BUN 25* 22  CREATININE 0.74 0.78  CALCIUM 8.7* 8.8*   PT/INR No results for input(s): "LABPROT", "INR" in the last 72 hours. ABG No results for input(s): "PHART", "HCO3" in the last 72 hours.  Invalid input(s): "PCO2", "PO2"  Studies/Results: CT HEAD WO CONTRAST ( )  Result Date: 12/25/2022 CLINICAL DATA:  Provided history: Mental status change, unknown cause. EXAM: CT HEAD WITHOUT CONTRAST TECHNIQUE: Contiguous axial images were obtained from the base of the skull through the vertex without intravenous contrast. RADIATION DOSE REDUCTION: This exam was performed according to the departmental dose-optimization program which includes automated exposure control, adjustment of the mA and/or kV according to patient size and/or use of iterative reconstruction technique. COMPARISON:  Head CT 12/15/2022.  Brain MRI 11/25/2020. FINDINGS:  Mildly motion degraded exam. Brain: Generalized cerebral atrophy. 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 Mild chronic small vessel ischemic changes within the cerebral white matter, also better appreciated on the prior MRI. There is no acute intracranial hemorrhage. No demarcated cortical infarct. No extra-axial fluid collection. No evidence of an intracranial mass. No midline shift. Vascular: No hyperdense vessel.  Atherosclerotic calcifications. Skull: No fracture or aggressive osseous lesion. Sinuses/Orbits: No mass or acute finding within the imaged orbits. Mild mucosal thickening within the right maxillary sinus. Minimal mucosal thickening within the left maxillary, bilateral sphenoid and bilateral ethmoid sinuses. IMPRESSION: 1. Mildly motion degraded exam. 2. No evidence of an acute intracranial abnormality. 3. 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 4. Cerebral atrophy and mild cerebral white matter chronic small vessel disease. 5. Mild paranasal sinus mucosal thickening. Electronically Signed   By: Jackey Loge D.O.   On: 12/25/2022 09:53    Anti-infectives: Anti-infectives (From admission, onward)    Start     Dose/Rate Route Frequency Ordered Stop   12/19/22 2000  piperacillin-tazobactam (ZOSYN) IVPB 3.375 g        3.375 g 12.5 mL/hr over 240 Minutes Intravenous Every 8 hours 12/19/22 1550 12/23/22 2359   12/16/22 1100  piperacillin-tazobactam (ZOSYN) IVPB 3.375 g  Status:  Discontinued        3.375 g 12.5 mL/hr over 240 Minutes Intravenous Every 8 hours 12/16/22 0954 12/19/22 1550   12/16/22 0400  ampicillin-sulbactam (UNASYN) 1.5 g in sodium chloride 0.9 %  100 mL IVPB  Status:  Discontinued        1.5 g 200 mL/hr over 30 Minutes Intravenous Every 6 hours 12/16/22 0258 12/16/22 0855   12/15/22 1630  piperacillin-tazobactam (ZOSYN) IVPB 3.375 g  Status:  Discontinued        3.375  g 12.5 mL/hr over 240 Minutes Intravenous Every 8 hours 12/15/22 1528 12/16/22 0307   12/15/22 1000  azithromycin (ZITHROMAX) tablet 500 mg  Status:  Discontinued        500 mg Oral Daily 12/15/22 0900 12/16/22 0258   12/15/22 0045  piperacillin-tazobactam (ZOSYN) IVPB 3.375 g        3.375 g 12.5 mL/hr over 240 Minutes Intravenous  Once 12/15/22 0044 12/15/22 0446       Assessment/Plan: s/p Procedure(s): EXPLORATORY LAPAROTOMY, BOWEL RESECTION (N/A) Advance diet as tolerated Hg stable Continue tpn Afib per cards POD 8 s/p Exploratory laparotomy, lysis of adhesions, small bowel resection with primary anastomosis for Contained perforation of jejunal diverticulum by Dr. Corliss Skains on 12/18/22 - Path benign - NG removed by patient, will try without it. If he has n/v needs replaced (discussed with nurse) - Cont TPN - IV Tylenol and Robaxin while NGT in place. PRN dilaudid.  - Cont abx, plan 5d post op - Mobilize - PT/OT - Pulm toilet, IS, wean o2 as able -afib. Cardizem drip per Cardiology. Rate needs to be controlled before he will participate in therapy   FEN - NPO, PICC/TPN VTE - SCDs, heparin gtt.  ID - Zosyn Foley - Out POD 1, voiding.     CLL (last chemo in November but on venetoclax)  Recent resp issues - bronchitis, pneumonia, RSV  A. Fib/Bradycardia - pacemaker placement about a month ago   LOS: 11 days    Jonathon Parker 12/26/2022

## 2022-12-26 NOTE — Progress Notes (Signed)
PHARMACY - TOTAL PARENTERAL NUTRITION CONSULT NOTE  Indication: Small bowel diverticula perforation   Patient Measurements: Height: 6' 0.5" (184.2 cm) Weight: 87.5 kg (192 lb 14.4 oz) IBW/kg (Calculated) : 78.75 TPN AdjBW (KG): 95.3 Body mass index is 25.8 kg/m.  Assessment:  65 YOM presented to [facility] on 5/11 with fevers, abdominal pain and confusion.  PMH significant for CLL, and colon cancer post L hemicolectomy.  Recent history of pacemaker placement for Afib/brady and enteritis.  CT showed small bowel diverticulitis vs enteritis vs SBO with ischemia.  Patient was on a CLD on 5/13 and began having increased abdominal pain.  Surgery plans to perform an ex-lap with SBR.  Patient reports eating two meals a day and intake was reduced to ~50% given his abdominal pain that started months ago.  Patient feels that he lost weight but is unsure of how much.  Glucose / Insulin: no hx DM - CBGs < 180  Used 4 units SSI Electrolytes: all WNL (Na low normal, Phos trending up) Renal: SCr < 1, BUN WNL Hepatic: AST WNL, AST mildly elevated at 45, alk phos up to 192, tbili normalized, albumin 3 Intake / Output; MIVF: UOP 1.5 ml/kg/hr, BM x4 GI Imaging:  5/19 portable X-ray shows nonobstructive bowel gas pattern GI Surgeries / Procedures:  5/15 ex-lap with LoA, SBR with primary anastomosis  Central access: PICC placed 12/17/22 TPN start date: 12/18/22  Nutritional Goals: RD Estimated Needs Total Energy Estimated Needs: 2400-2600 Total Protein Estimated Needs: 120-135g Total Fluid Estimated Needs: >/=2L  Current Nutrition:  TPN CLD Resource Breeze TID - 1 charted given yesterday  Plan:  Continue TPN at goal rate of 95 mL/hr to provide 125g AA and 2433 kCal, meeting 100% of needs Electrolytes in TPN: increase Na to 118mEq/L, K 38 mEq/L, Ca 5 mEq/L, Mg 5 mEq/L, reduce Phos to 17 mmol/L, Cl:Ac 1:1 Add standard MVI and trace elements to TPN D/C SSI/CBG checks Standard TPN labs on Mon and  Thurs - repeat labs on 5/25 F/U with mentation/diet advancement to begin weaning TPN  Greysen Swanton D. Laney Potash, PharmD, BCPS, BCCCP 12/26/2022, 7:23 AM

## 2022-12-26 NOTE — Social Work (Signed)
Name: Jonathon Parker DOB: [birth year]  Please be advised that the above-named patient will require a short-term nursing home stay -- anticipated 30 days or less for rehabilitation and strengthening. The plan is for return home.

## 2022-12-26 NOTE — Progress Notes (Signed)
OT Cancellation Note  Patient Details Name: Jonathon Parker [facility] MRN: [id number] DOB: 1951-03-29   Cancelled Treatment:    Reason Eval/Treat Not Completed: Fatigue/lethargy limiting ability to participate. Upon arrival to pt's room, pt sleeping heavily and family requested that OT return another time and so that pt can be able to sleep/rest due to not sleeping well last few nights per family. Pt's RN notified. OT will follow up next available time   Galen Manila 12/26/2022, 3:27 PM

## 2022-12-26 NOTE — Plan of Care (Signed)

## 2022-12-26 NOTE — Progress Notes (Signed)
OT Cancellation Note  Patient Details Name: Jonathon Parker [facility] MRN: [id number] DOB: 07/10/1951   Cancelled Treatment:    Reason Eval/Treat Not Completed: Medical issues which prohibited therapy. Pt's RN requested OT try back later due to preparing to receive blood transfusion. OT will check back this afternoon  Galen Manila 12/26/2022, 10:17 AM

## 2022-12-26 NOTE — Consult Note (Signed)
Palliative Care Consult Note                                  Date: 12/26/2022   Patient Name: Jonathon Parker [facility]  DOB: 10-26-1950  MRN: [id number]  Age / Sex: 72 y.o., male  PCP: Paulina Fusi, MD Referring Physician: Dorcas Carrow, MD  Reason for Consultation: Establishing goals of care  HPI/Patient Profile: 72 y.o. male  with past medical history of CLL, paroxysmal A-fib on Eliquis, history of colon cancer and left hemicolectomy more than 10 years ago, GERD who was suffering from enteritis and recently on oral antibiotics came to the emergency room with worsening abdominal pain and confusion associated with watery stools.  Workup in the emergency room revealed perforated diverticulitis of the small bowel in the right upper quadrant.  He was admitted on 12/14/2022.  Started on IV antibiotics and resuscitation.  Patient underwent surgical exploration on 5/15.  Postoperative recovery complicated with delirium, A-fib and prolonged ileus. 5/22, with florid delirium features.    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 Valentino Hue in [facility]     Depression    GERD (gastroesophageal reflux disease)    controlled with nexium    Hypertension    Pacemaker     Subjective:   I have reviewed medical records including EPIC notes, labs and imaging, assessed the patient and then met with the patient and his family to discuss diagnosis prognosis, GOC, EOL wishes, disposition and options.  I introduced Palliative Medicine as specialized medical care for people living with serious illness. It focuses on providing relief from symptoms and stress of a serious illness. The goal is to improve quality of life for both the patient and the family.  Created space and opportunity for patient  and family  to explore thoughts and feelings regarding current medical situation. Values and goals of care important to patient and family were attempted to be elicited.    Patient/Family Understanding of Illness: The patient and his family have a very good understanding of his chronic and acute illnesses.  Social History: Patient is married to wife Turkey. They have two adult sons one in [place] and the other in [place]. 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  Functional and Nutritional State: Prior to this admission the patient had declining functional status. He often felt weak due to his chronic conditions and would need some assistance at home. His wife Turkey became tearful when we discussed the at home situation as the patient struggled with weakness.  Today's Discussion: We discussed GOC. We reviewed his Living Will which his wife brought. We then discussed Code Status. During these conversations the patient and family became tearful. We told them a decision did not need to be made today but we encouraged them to continue the discussion as a  family. The family and patient are encouraged with the improvement in the patients delirium. They are hopeful he will discharge to rehab then home.  Discussed the importance of continued conversation with family and the medical providers regarding overall plan of care and treatment options, ensuring decisions are within the context of the patient's values and GOCs.  Questions and concerns were addressed. Hard Choices booklet left for review. The family was encouraged to call with questions or concerns. PMT will continue to support holistically.  Review of Systems  Constitutional:  Positive for fatigue.  Neurological:  Positive for weakness.    Objective:   Primary Diagnoses: Present on Admission:  Enteritis  Depression  CLL (chronic lymphocytic leukemia)  (HCC)  Sepsis (HCC)   Physical Exam Vitals reviewed.  Constitutional:      Appearance: He is ill-appearing.  Pulmonary:     Effort: Pulmonary effort is normal.  Neurological:     Mental Status: He is alert.  Psychiatric:        Mood and Affect: Mood normal.        Behavior: Behavior normal.        Thought Content: Thought content normal.     Vital Signs:  BP (!) 150/90 (BP Location: Left Arm)   Pulse 95   Temp 97.6 F (36.4 C) (Oral)   Resp 20   Ht 6' 0.5" (1.842 m)   Wt 87.5 kg   SpO2 99%   BMI 25.80 kg/m   Palliative Assessment/Data:     Advanced Care Planning:   Existing Vynca/ACP Documentation: None  Primary Decision Maker: The patient is currently the primary decision maker. If he were unable to make a decision his HCPOAs are listed as his wife Turkey first and then his sons.   Code Status/Advance Care Planning: Full code  A discussion was had today regarding advanced directives. Concepts specific to code status were had. Recommended consideration of DNR status, understanding evidenced-based poor outcomes in similar hospitalized patients, as the cause of the arrest is likely associated with chronic/terminal disease rather than a reversible acute cardio-pulmonary event. The patient and his family would like time to consider this.  The difference between a aggressive medical intervention path and a palliative comfort care path for this patient at this time was had.   The patient has advanced directives which have been placed in physical chart and will be scanned into Vynca.  Decisions/Changes to ACP: Advanced Directives added to chart  Assessment & Plan:   SUMMARY OF RECOMMENDATIONS   Full Code Full scope Continued discussion around Code Status and GOC Continued PMT support   Prognosis:  Unable to determine  Discharge Planning:  To Be Determined   Discussed with: bedside RN and Dr. Jerral Ralph    Thank you for allowing [place] to participate in  the care of [name] PMT will continue to support holistically.  Time Total: 60 minutes    Signed by: Sarina Ser, NP Palliative Medicine Team  Team Phone # [phone] (Nights/Weekends)  12/26/2022, 7:25 AM

## 2022-12-27 ENCOUNTER — Inpatient Hospital Stay (HOSPITAL_COMMUNITY): Payer: Medicare PPO

## 2022-12-27 DIAGNOSIS — K529 Noninfective gastroenteritis and colitis, unspecified: Secondary | ICD-10-CM | POA: Diagnosis not present

## 2022-12-27 DIAGNOSIS — I4811 Longstanding persistent atrial fibrillation: Secondary | ICD-10-CM | POA: Diagnosis not present

## 2022-12-27 LAB — TYPE AND SCREEN: ABO/RH(D): A POS

## 2022-12-27 LAB — CBC
HCT: 32.6 % — ABNORMAL LOW (ref 39.0–52.0)
Hemoglobin: 10.7 g/dL — ABNORMAL LOW (ref 13.0–17.0)
MCH: 31.8 pg (ref 26.0–34.0)
MCHC: 32.8 g/dL (ref 30.0–36.0)
MCV: 96.7 fL (ref 80.0–100.0)
Platelets: 169 10*3/uL (ref 150–400)
RBC: 3.37 MIL/uL — ABNORMAL LOW (ref 4.22–5.81)
RDW: 17.4 % — ABNORMAL HIGH (ref 11.5–15.5)
WBC: 7.8 10*3/uL (ref 4.0–10.5)
nRBC: 0.3 % — ABNORMAL HIGH (ref 0.0–0.2)

## 2022-12-27 LAB — BPAM RBC
Blood Product Expiration Date: 202406182359
ISSUE DATE / TIME: 202405211610
ISSUE DATE / TIME: 202405231036
Unit Type and Rh: 6200
Unit Type and Rh: 6200

## 2022-12-27 MED ORDER — SODIUM CHLORIDE 0.9 % IV SOLN
12.5000 mg | Freq: Four times a day (QID) | INTRAVENOUS | Status: DC | PRN
  Administered 2022-12-27 (×2): 12.5 mg via INTRAVENOUS
  Filled 2022-12-27 (×2): qty 12.5
  Filled 2022-12-27: qty 0.5

## 2022-12-27 MED ORDER — TRAVASOL 10 % IV SOLN
INTRAVENOUS | Status: AC
  Filled 2022-12-27: qty 1254

## 2022-12-27 MED ORDER — APIXABAN 5 MG PO TABS
5.0000 mg | ORAL_TABLET | Freq: Two times a day (BID) | ORAL | Status: DC
  Administered 2022-12-27 – 2023-01-03 (×14): 5 mg via ORAL
  Filled 2022-12-27 (×15): qty 1

## 2022-12-27 MED ORDER — ACETAMINOPHEN 500 MG PO TABS
1000.0000 mg | ORAL_TABLET | Freq: Four times a day (QID) | ORAL | Status: AC
  Administered 2022-12-27 – 2022-12-28 (×4): 1000 mg via ORAL
  Filled 2022-12-27 (×4): qty 2

## 2022-12-27 MED ORDER — TRAZODONE HCL 100 MG PO TABS
100.0000 mg | ORAL_TABLET | Freq: Every day | ORAL | Status: DC
  Administered 2022-12-27 – 2023-01-02 (×7): 100 mg via ORAL
  Filled 2022-12-27 (×7): qty 1

## 2022-12-27 NOTE — Progress Notes (Signed)
9 Days Post-Op   Subjective/Chief Complaint: Had a restless night but more calm and comfortable today. Vomited overnight   Objective: Vital signs in last 24 hours: Temp:  [97.7 F (36.5 C)-98.7 F (37.1 C)] 97.7 F (36.5 C) (05/24 0335) Pulse Rate:  [68-95] 95 (05/24 0335) Resp:  [16-20] 20 (05/24 0335) BP: (139-156)/(83-111) 141/88 (05/24 0335) SpO2:  [95 %-98 %] 98 % (05/24 0335) Weight:  [88.5 kg] 88.5 kg (05/24 0335) Last BM Date : 12/26/22  Intake/Output from previous day: 05/23 0701 - 05/24 0700 In: 589 [P.O.:240; I.V.:27; Blood:322] Out: 2000 [Urine:2000] Intake/Output this shift: No intake/output data recorded.  General appearance: delirious Resp: clear to auscultation bilaterally Cardio: irregularly irregular rhythm GI: soft, mild tenderness. Good bs. Incision ok  Lab Results:  Recent Labs    12/25/22 0418 12/25/22 2050 12/26/22 0550  WBC 6.4  --  7.5  HGB 8.5* 8.0* 8.6*  HCT 25.8* 25.2* 25.6*  PLT 186  --  176   BMET Recent Labs    12/25/22 0947 12/26/22 0550  NA 139 135  K 3.9 4.2  CL 106 106  CO2 23 22  GLUCOSE 124* 110*  BUN 25* 22  CREATININE 0.74 0.78  CALCIUM 8.7* 8.8*   PT/INR No results for input(s): "LABPROT", "INR" in the last 72 hours. ABG No results for input(s): "PHART", "HCO3" in the last 72 hours.  Invalid input(s): "PCO2", "PO2"  Studies/Results: CT HEAD WO CONTRAST ( )  Result Date: 12/25/2022 CLINICAL DATA:  Provided history: Mental status change, unknown cause. EXAM: CT HEAD WITHOUT CONTRAST TECHNIQUE: Contiguous axial images were obtained from the base of the skull through the vertex without intravenous contrast. RADIATION DOSE REDUCTION: This exam was performed according to the departmental dose-optimization program which includes automated exposure control, adjustment of the mA and/or kV according to patient size and/or use of iterative reconstruction technique. COMPARISON:  Head CT 12/15/2022.  Brain MRI 11/25/2020.  FINDINGS: Mildly motion degraded exam. Brain: Generalized cerebral atrophy. 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 Mild chronic small vessel ischemic changes within the cerebral white matter, also better appreciated on the prior MRI. There is no acute intracranial hemorrhage. No demarcated cortical infarct. No extra-axial fluid collection. No evidence of an intracranial mass. No midline shift. Vascular: No hyperdense vessel.  Atherosclerotic calcifications. Skull: No fracture or aggressive osseous lesion. Sinuses/Orbits: No mass or acute finding within the imaged orbits. Mild mucosal thickening within the right maxillary sinus. Minimal mucosal thickening within the left maxillary, bilateral sphenoid and bilateral ethmoid sinuses. IMPRESSION: 1. Mildly motion degraded exam. 2. No evidence of an acute intracranial abnormality. 3. 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 4. Cerebral atrophy and mild cerebral white matter chronic small vessel disease. 5. Mild paranasal sinus mucosal thickening. Electronically Signed   By: Jackey Loge D.O.   On: 12/25/2022 09:53    Anti-infectives: Anti-infectives (From admission, onward)    Start     Dose/Rate Route Frequency Ordered Stop   12/19/22 2000  piperacillin-tazobactam (ZOSYN) IVPB 3.375 g        3.375 g 12.5 mL/hr over 240 Minutes Intravenous Every 8 hours 12/19/22 1550 12/23/22 2359   12/16/22 1100  piperacillin-tazobactam (ZOSYN) IVPB 3.375 g  Status:  Discontinued        3.375 g 12.5 mL/hr over 240 Minutes Intravenous Every 8 hours 12/16/22 0954 12/19/22 1550   12/16/22 0400  ampicillin-sulbactam (UNASYN) 1.5 g  in sodium chloride 0.9 % 100 mL IVPB  Status:  Discontinued        1.5 g 200 mL/hr over 30 Minutes Intravenous Every 6 hours 12/16/22 0258 12/16/22 0855   12/15/22 1630  piperacillin-tazobactam (ZOSYN) IVPB 3.375 g  Status:  Discontinued         3.375 g 12.5 mL/hr over 240 Minutes Intravenous Every 8 hours 12/15/22 1528 12/16/22 0307   12/15/22 1000  azithromycin (ZITHROMAX) tablet 500 mg  Status:  Discontinued        500 mg Oral Daily 12/15/22 0900 12/16/22 0258   12/15/22 0045  piperacillin-tazobactam (ZOSYN) IVPB 3.375 g        3.375 g 12.5 mL/hr over 240 Minutes Intravenous  Once 12/15/22 0044 12/15/22 0446       Assessment/Plan: s/p Procedure(s): EXPLORATORY LAPAROTOMY, BOWEL RESECTION (N/A) Will check abd xrays today Hold on advancing diet for now Hg stable Continue tpn Afib per cards POD 9 s/p Exploratory laparotomy, lysis of adhesions, small bowel resection with primary anastomosis for Contained perforation of jejunal diverticulum by Dr. Corliss Skains on 12/18/22 - Path benign - NG removed by patient, will try without it. If he has n/v needs replaced (discussed with nurse) - Cont TPN - IV Tylenol and Robaxin while NGT in place. PRN dilaudid.  - Cont abx, plan 5d post op - Mobilize - PT/OT - Pulm toilet, IS, wean o2 as able -afib. Cardizem drip per Cardiology. Rate needs to be controlled before he will participate in therapy   FEN - NPO, PICC/TPN VTE - SCDs, heparin gtt.  ID - Zosyn Foley - Out POD 1, voiding.     CLL (last chemo in November but on venetoclax)  Recent resp issues - bronchitis, pneumonia, RSV  A. Fib/Bradycardia - pacemaker placement about a month ago   LOS: 12 days    Chevis Pretty III 12/27/2022

## 2022-12-27 NOTE — Progress Notes (Signed)
Nutrition Follow-up  DOCUMENTATION CODES:   Non-severe (moderate) malnutrition in context of chronic illness  INTERVENTION:   Given inability to tolerate adequate and consistent PO intake, recommend continuing TPN at full rate until pt is meeting at least 60% of minimum estimated needs   Discontinue Boost Breeze   Consider adding [employer] Breakfast to try once tolerating PO intake  NUTRITION DIAGNOSIS:   Moderate Malnutrition related to chronic illness (CLL, diverticulitis) as evidenced by moderate fat depletion, moderate muscle depletion, mild muscle depletion, energy intake < or equal to 75% for > or equal to 1 month. - remains applicable  GOAL:   Patient will meet greater than or equal to 90% of their needs - goal met via TPN  MONITOR:   Diet advancement, Labs, Weight trends, I & O's  REASON FOR ASSESSMENT:   Consult New TPN/TNA  ASSESSMENT:   Pt admitted with enteritis. PMH significant for CLL, paroxysmal afib on eliquis, colon cancer s/p L hemicolectomy (10+ years ago), GERD.  5/15 - s/p exlap, lysis of adhesion, small bowel resection with primary anastomosis; TPN initiated 5/20 - diet advanced to clear liquids  5/23 - diet advanced to full liquids  Pt with 1 episode of emesis over night. Surgery holding further diet advancement for now. S/p abd xray today.   Briefly spoke with pt and wife at bedside. She reports that he only had a couple bites of grits and ~75% of pudding yesterday. He is not consuming boost breeze as he does not like the taste.   Pt sitting up at time of visit attempting to eat. Pt's wife recalls pt having  couple bites of grits this morning. He had consumed about 10% of cream soup. 10% of ice pop and had taken 1-2 bites of vanilla pudding but began to experience lots of green emesis. Made RN aware.   Admit weight: 95.3 kg Current weight: 88.5 kg  Medications: protonix  Labs reviewed  CBG's 108 x24 hours  UOP: 2L x24  hours I/O's: - since admit  Diet Order:   Diet Order             Diet full liquid Room service appropriate? Yes; Fluid consistency: Thin  Diet effective now                   EDUCATION NEEDS:   Education needs have been addressed  Skin:  Skin Assessment: Reviewed RN Assessment (closed abominal incision)  Last BM:  5/24 type 7 small  Height:   Ht Readings from Last 1 Encounters:  12/18/22 6' 0.5" (1.842 m)    Weight:   Wt Readings from Last 1 Encounters:  12/27/22 88.5 kg   BMI:  Body mass index is 26.1 kg/m.  Estimated Nutritional Needs:   Kcal:  2400-2600  Protein:  120-135g  Fluid:  >/=2L  Drusilla Kanner, RDN, LDN Clinical Nutrition

## 2022-12-27 NOTE — TOC Progression Note (Signed)
Transition of Care [facility]) - Progression Note    Patient Details  Name: Kymon Logalbo MRN: [id number] Date of Birth: 04-02-1951  Transition of Care [facility]) CM/SW Contact  Leander Rams, LCSW Phone Number: 12/27/2022, 11:09 AM  Clinical Narrative:    CSW has received PASRR for pt. [id number] E. CSW informed [facility] that pt will likely be medically ready Monday or Tuesday of next week. There will need to be an updated PT note, in order to start insurance auth.   TOC will continue to follow.    Expected Discharge Plan: Skilled Nursing Facility Barriers to Discharge: Continued Medical Work up  Expected Discharge Plan and Services In-house Referral: Clinical Social Work     Living arrangements for the past 2 months: Single Family Home                                       Social Determinants of Health (SDOH) Interventions SDOH Screenings   Food Insecurity: No Food Insecurity (12/15/2022)  Housing: Low Risk  (12/15/2022)  Transportation Needs: No Transportation Needs (12/15/2022)  Utilities: Not At Risk (12/15/2022)  Tobacco Use: Low Risk  (12/19/2022)    Readmission Risk Interventions     No data to display         Oletta Lamas, MSW, LCSWA, LCASA Transitions of Care  Clinical Social Worker I

## 2022-12-27 NOTE — Progress Notes (Signed)
PHARMACY - TOTAL PARENTERAL NUTRITION CONSULT NOTE  Indication: Small bowel diverticula perforation   Patient Measurements: Height: 6' 0.5" (184.2 cm) Weight: 88.5 kg (195 lb 1.7 oz) IBW/kg (Calculated) : 78.75 TPN AdjBW (KG): 95.3 Body mass index is 26.1 kg/m.  Assessment:  20 YOM presented to [facility] on 5/11 with fevers, abdominal pain and confusion.  PMH significant for CLL, and colon cancer post L hemicolectomy.  Recent history of pacemaker placement for Afib/brady and enteritis.  CT showed small bowel diverticulitis vs enteritis vs SBO with ischemia.  Patient was on a CLD on 5/13 and began having increased abdominal pain.  Surgery plans to perform an ex-lap with SBR.  Patient reports eating two meals a day and intake was reduced to ~50% given his abdominal pain that started months ago.  Patient feels that he lost weight but is unsure of how much.  Glucose / Insulin: no hx DM - CBGs < 180.  SSI D/C'ed Electrolytes: all WNL on 5/23 (Na low normal, Phos trending up) Renal: SCr < 1, BUN WNL Hepatic: AST WNL, AST mildly elevated at 45, alk phos up to 192, tbili normalized, albumin 3 Intake / Output; MIVF: UOP 0.9 ml/kg/hr, vomited 5/23, BM x1 on 5/23 GI Imaging:  5/19 portable X-ray shows nonobstructive bowel gas pattern GI Surgeries / Procedures:  5/15 ex-lap with LoA, SBR with primary anastomosis  Central access: PICC placed 12/17/22 TPN start date: 12/18/22  Nutritional Goals: RD Estimated Needs Total Energy Estimated Needs: 2400-2600 Total Protein Estimated Needs: 120-135g Total Fluid Estimated Needs: >/=2L  Current Nutrition:  TPN FLD - consume 10% of meals Resource Breeze TID - 2 charted given yesterday  Plan:  Continue TPN at goal rate of 95 mL/hr to provide 125g AA and 2433 kCal, meeting 100% of needs Electrolytes in TPN: increase Na to 143mEq/L on 5/23, K 38 mEq/L, Ca 5 mEq/L, Mg 5 mEq/L, reduce Phos to 17 mmol/L on 5/23, Cl:Ac 1:1 Add standard MVI and trace  elements to TPN Standard TPN labs on Mon and Thurs - BMET/Phos in AM F/U with mentation/diet advancement to begin weaning TPN  Manuela Halbur D. Laney Potash, PharmD, BCPS, BCCCP 12/27/2022, 8:39 AM

## 2022-12-27 NOTE — Progress Notes (Signed)
PT Cancellation Note  Patient Details Name: Jonathon Parker [facility] MRN: [id number] DOB: [birth year]   Cancelled Treatment:    Reason Eval/Treat Not Completed: Other (comment).  Pt initially was eating and now has just vomited, asking to sit still.  Revisit as time and pt allow.   Ivar Drape 12/27/2022, 1:18 PM  Samul Dada, PT PhD Acute Rehab Dept. Number: [facility] [id number] and [facility] [phone]

## 2022-12-27 NOTE — Progress Notes (Signed)
PROGRESS NOTE    Jonathon Parker [facility]  ZOX:[id number] DOB: [birth year] DOA: 12/14/2022 PCP: Paulina Fusi, MD    Brief Narrative:  72 year old gentleman with history of CLL, paroxysmal A-fib on Eliquis, history of colon cancer and left hemicolectomy more than 10 years ago, GERD who was suffering from enteritis and recently on oral antibiotics came to the emergency room with worsening abdominal pain and confusion associated with watery stools.  Workup in the emergency room revealed perforated diverticulitis of the small bowel in the right upper quadrant.  Started on IV antibiotics and resuscitation.  Patient underwent surgical exploration on 5/15.  Postoperative recovery complicated with delirium, A-fib and prolonged ileus. 5/22, with florid delirium features. 5/23, some improvement of symptoms.  Using haloperidol.  Recurrent nausea.   Assessment & Plan:   Perforated diverticulitis of the small bowel: Exploratory laparotomy lysis of adhesion and small bowel resection with primary anastomosis 5/15 Prolonged ileus.  Now with return of bowel function, however poor tolerance of diet. Remains on TPN due to poor oral intake, pharmacy managing. Continue mobility. Completed antibiotics after the procedure. Hopefully he can tolerate diet and can come off TPN next few days. Full liquid diet today.  Surgery following.  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 Was on heparin perioperative.  No evidence of ongoing bleeding.  Will start Eliquis today. On metoprolol. control heart rate with as needed injectable metoprolol. Echocardiogram with ejection fraction 55 to 60%.  Acute blood loss anemia on chronic macrocytic anemia: Baseline hemoglobin about 12. Postop hemoglobin 7.6-7.2-1 unit PRBC 8-1 additional unit of PRBC-hemoglobin more than 10.  Monitor closely.  Hospital-acquired delirium: Patient with underlying history of dementia.  He has  developed significant tremulousness and confusion. Florid delirium 5/22 responded to haloperidol. CT head was essentially normal.  Ammonia was normal. Haloperidol 2.5 mg in the morning, 5 mg in the evening.  As needed injectable haloperidol. He did not do well with Seroquel. Tylenol 1 g every 6 hours scheduled. Trazodone 50 mg at night, increase to 100 mg at night today.  Depression, dementia without behavioral disturbances: On Wellbutrin, Aricept .  As above.  Obstructive sleep apnea: Use CPAP as tolerated.  Patient is usually declining.  Start mobilizing with PT OT. Still not tolerating diet.  DVT prophylaxis: Place and maintain sequential compression device Start: 12/16/22 1321 apixaban (ELIQUIS) tablet 5 mg   Code Status: Full code Family Communication: Wife at the bedside. Disposition Plan: Status is: Inpatient Remains inpatient appropriate because: Significant delirium, TPN, intolerance to diet.     Consultants:  Cardiology Surgery Palliative care,   Procedures:  Ex lap 5/50  Antimicrobials:  Completed Zosyn   Subjective:  Patient seen and examined.  He is more calm and interactive.  Family reported that he slept well last night with some interruption in between.  Patient himself is more awake alert and interactive.  He had frequent nausea today and 1 episode of vomiting last night.  Denies any abdominal pain.  Passing flatus.  Heart rate is well-controlled.   Objective: Vitals:   12/27/22 0014 12/27/22 0335 12/27/22 0813 12/27/22 1137  BP: (!) 149/88 (!) 141/88 (!) 157/82 (!) 152/94  Pulse: 70 95 73 75  Resp: 20 20 20 20   Temp: 98.7 F (37.1 C) 97.7 F (36.5 C) 97.7 F (36.5 C) 97.7 F (36.5 C)  TempSrc: Oral Oral Oral Oral  SpO2: 97% 98% 98% 95%  Weight:  88.5 kg    Height:  Intake/Output Summary (Last 24 hours) at 12/27/2022 1301 Last data filed at 12/27/2022 1230 Gross per 24 hour  Intake 349 ml  Output 3000 ml  Net -2651 ml    Filed  Weights   12/25/22 0404 12/26/22 0500 12/27/22 0335  Weight: 90.1 kg 87.5 kg 88.5 kg    Examination:  General: Frail and debilitated.  Not in any distress.  Looks comfortable on my interview.  Alert awake and mostly oriented. Does not have any focal neurological deficits.  Moves all extremities equally. Cardiovascular: S1-S2 normal.  Irregularly irregular.  Tachycardic. Respiratory: Bilateral clear.  No added sounds. Gastrointestinal: Soft.  Midline incision with intact staples.  Nontender.  No rigidity or guarding. Ext: No edema.     Data Reviewed: I have personally reviewed following labs and imaging studies  CBC: Recent Labs  Lab 12/23/22 0507 12/23/22 1229 12/24/22 0525 12/25/22 0418 12/25/22 2050 12/26/22 0550 12/27/22 1103  WBC 4.9  --  6.9 6.4  --  7.5 7.8  HGB 7.6*   < > 7.2* 8.5* 8.0* 8.6* 10.7*  HCT 23.9*  --  22.4* 25.8* 25.2* 25.6* 32.6*  MCV 103.5*  --  103.2* 98.5  --  98.5 96.7  PLT 191  --  217 186  --  176 169   < > = values in this interval not displayed.    Basic Metabolic Panel: Recent Labs  Lab 12/21/22 0550 12/22/22 0540 12/23/22 0507 12/25/22 0947 12/26/22 0550  NA 138 138 135 139 135  K 4.0 4.2 3.9 3.9 4.2  CL 100 101 101 106 106  CO2 28 28 27 23 22   GLUCOSE 124* 152* 176* 124* 110*  BUN 18 22 23  25* 22  CREATININE 0.88 1.15 0.73 0.74 0.78  CALCIUM 8.9 8.7* 8.6* 8.7* 8.8*  MG 2.0 1.8 2.0  --  2.1  PHOS 3.6 1.7* 2.8  --  3.9    GFR: Estimated Creatinine Clearance: 93 mL/min (by C-G formula based on SCr of 0.78 mg/dL). Liver Function Tests: Recent Labs  Lab 12/23/22 0507 12/26/22 0550  AST 35 27  ALT 42 45*  ALKPHOS 163* 192*  BILITOT 0.8 0.8  PROT 5.7* 5.8*  ALBUMIN 2.4* 3.0*    No results for input(s): "LIPASE", "AMYLASE" in the last 168 hours. Recent Labs  Lab 12/25/22 0946  AMMONIA 25    Coagulation Profile: No results for input(s): "INR", "PROTIME" in the last 168 hours. Cardiac Enzymes: No results for  input(s): "CKTOTAL", "CKMB", "CKMBINDEX", "TROPONINI" in the last 168 hours. BNP (last 3 results) No results for input(s): "PROBNP" in the last 8760 hours. HbA1C: No results for input(s): "HGBA1C" in the last 72 hours. CBG: Recent Labs  Lab 12/25/22 0533 12/25/22 1122 12/25/22 1542 12/25/22 2309 12/26/22 0600  GLUCAP 132* 129* 128* 150* 108*    Lipid Profile: No results for input(s): "CHOL", "HDL", "LDLCALC", "TRIG", "CHOLHDL", "LDLDIRECT" in the last 72 hours.  Thyroid Function Tests: No results for input(s): "TSH", "T4TOTAL", "FREET4", "T3FREE", "THYROIDAB" in the last 72 hours. Anemia Panel: No results for input(s): "VITAMINB12", "FOLATE", "FERRITIN", "TIBC", "IRON", "RETICCTPCT" in the last 72 hours. Sepsis Labs: Recent Labs  Lab 12/20/22 2300  PROCALCITON 0.20     Recent Results (from the past 240 hour(s))  Surgical pcr screen     Status: None   Collection Time: 12/18/22  9:02 AM   Specimen: Nasal Mucosa; Nasal Swab  Result Value Ref Range Status   MRSA, PCR NEGATIVE NEGATIVE Final   Staphylococcus aureus NEGATIVE  NEGATIVE Final    Comment: (NOTE) The Xpert SA Assay (FDA approved for NASAL specimens in patients 41 years of age and older), is one component of a comprehensive surveillance program. It is not intended to diagnose infection nor to guide or monitor treatment. Performed at [facility], [address] [place], [place] [address]          Radiology Studies: No results found.      Scheduled Meds:  acetaminophen  1,000 mg Oral Q6H   alum & mag hydroxide-simeth  30 mL Oral Once   apixaban  5 mg Oral BID   atorvastatin  20 mg Oral Daily   buPROPion  300 mg Oral Daily   Chlorhexidine Gluconate Cloth  6 each Topical Daily   colestipol  1 g Oral Daily   donepezil  5 mg Oral Daily   Gerhardt's butt cream   Topical QID   haloperidol  2 mg Oral q morning   haloperidol  5 mg Oral q1800   metoprolol tartrate  75 mg Oral BID    pantoprazole (PROTONIX) IV  40 mg Intravenous Q2200   scopolamine  1 patch Transdermal Q72H   sodium chloride flush  10-40 mL Intracatheter Q12H   traZODone  100 mg Oral QHS   Continuous Infusions:  sodium chloride Stopped (12/26/22 1410)   methocarbamol (ROBAXIN) IV 1,000 mg (12/27/22 0558)   promethazine (PHENERGAN) injection (IM or IVPB)     TPN ADULT (ION) 95 mL/hr at 12/26/22 1747   TPN ADULT (ION)       LOS: 12 days    Time spent: 40 minutes     Dorcas Carrow, MD [facility] Pager [phone]

## 2022-12-27 NOTE — NC FL2 (Signed)
[place] MEDICAID [id number] LEVEL OF CARE FORM     IDENTIFICATION  Patient Name: Jonathon Parker [facility] Birthdate: [birth year] Sex: male Admission Date (Current Location): 12/14/2022  [facility] and [place] Number:  Producer, television/film/video and Address:  [facility], [address], [place], [place] [address]      Provider Number: [id number]  Attending Physician Name and Address:  Dorcas Carrow, MD  Relative Name and Phone Number:  Meidinger,Victoria (Spouse) [phone] (Mobile)    Current Level of Care: Hospital Recommended Level of Care: Skilled Nursing Facility Prior Approval Number:    Date Approved/Denied:   PASRR Number: pending  Discharge Plan: SNF    Current Diagnoses: Patient Active Problem List   Diagnosis Date Noted   Malnutrition of moderate degree 12/18/2022   Enteritis 12/15/2022   Acute ischemic VBA thalamic stroke, right (HCC) 11/24/2020   Mild intermittent asthma 01/28/2020   Dyspnea 05/11/2019   Thrombocytosis 05/11/2019   Alcohol dependence (HCC) 05/11/2019   Abnormal findings on diagnostic imaging of gall bladder 05/10/2019   Essential hypertension 04/22/2019   Post-operative complication 04/21/2019   Acute respiratory failure with hypoxia (HCC) 04/21/2019   SIRS (systemic inflammatory response syndrome) (HCC) 04/21/2019   Status post laparoscopic cholecystectomy 04/21/2019   HCAP (healthcare-associated pneumonia) 04/21/2019   CLL (chronic lymphocytic leukemia) (HCC) 04/21/2019   Calculous cholecystitis 02/28/2019   Campylobacter diarrhea    Diarrhea of infectious origin    STEC (Shiga toxin-producing Escherichia coli)    Elevated LFTs    Hypokalemia    Gastroenteritis 01/19/2017   Sepsis (HCC) 01/19/2017   GERD (gastroesophageal reflux disease) 01/19/2017   Depression 01/19/2017    Orientation RESPIRATION BLADDER Height & Weight     Self, Time, Situation, Place  Normal Incontinent, External catheter Weight: 195 lb 1.7 oz  (88.5 kg) Height:  6' 0.5" (184.2 cm)  BEHAVIORAL SYMPTOMS/MOOD NEUROLOGICAL BOWEL NUTRITION STATUS      Incontinent Diet (See dc summary)  AMBULATORY STATUS COMMUNICATION OF NEEDS Skin   Extensive Assist Verbally Normal                       Personal Care Assistance Level of Assistance  Bathing, Feeding, Dressing Bathing Assistance: Maximum assistance Feeding assistance: Limited assistance Dressing Assistance: Maximum assistance     Functional Limitations Info  Sight, Speech, Hearing Sight Info: Adequate Hearing Info: Adequate Speech Info: Adequate    SPECIAL CARE FACTORS FREQUENCY  PT (By licensed PT), OT (By licensed OT), Blood pressure     PT Frequency: 5xweek OT Frequency: 5xweek            Contractures Contractures Info: Not present    Additional Factors Info  Code Status Code Status Info: Full Allergies Info: Lisinopril  Zolpidem           Current Medications (12/27/2022):  This is the current hospital active medication list Current Facility-Administered Medications  Medication Dose Route Frequency Provider Last Rate Last Admin   0.9 %  sodium chloride infusion   Intravenous PRN Dimple Nanas, MD   Stopped at 12/26/22 1410   acetaminophen (TYLENOL) tablet 1,000 mg  1,000 mg Oral Q6H Ghimire, Lyndel Safe, MD   1,000 mg at 12/26/22 2116   albuterol (PROVENTIL) (2.5 MG/3ML) 0.083% nebulizer solution 2.5 mL  2.5 mL Inhalation Q6H PRN Eric Form, PA-C       alum & mag hydroxide-simeth (MAALOX/MYLANTA) 200-200-20 MG/5ML suspension 30 mL  30 mL Oral Once Carollee Herter, DO  atorvastatin (LIPITOR) tablet 20 mg  20 mg Oral Daily Eric Form, PA-C   20 mg at 12/26/22 0850   buPROPion (WELLBUTRIN XL) 24 hr tablet 300 mg  300 mg Oral Daily Eric Form, PA-C   300 mg at 12/26/22 1610   Chlorhexidine Gluconate Cloth 2 % PADS 6 each  6 each Topical Daily Eric Form, PA-C   6 each at 12/26/22 9604   colestipol (COLESTID) tablet 1 g  1 g Oral  Daily Eric Form, PA-C   1 g at 12/26/22 0850   donepezil (ARICEPT) tablet 5 mg  5 mg Oral Daily Eric Form, PA-C   5 mg at 12/26/22 0850   enoxaparin (LOVENOX) injection 90 mg  90 mg Subcutaneous Q12H Hammons, Gerhard Munch, RPH   90 mg at 12/26/22 2121   feeding supplement (BOOST / RESOURCE BREEZE) liquid 1 Container  1 Container Oral TID BM Dorcas Carrow, MD   1 Container at 12/26/22 2057   Gerhardt's butt cream   Topical QID Dorcas Carrow, MD   Given at 12/26/22 1716   haloperidol (HALDOL) tablet 2 mg  2 mg Oral q morning Dorcas Carrow, MD   2 mg at 12/26/22 5409   haloperidol (HALDOL) tablet 5 mg  5 mg Oral q1800 Dorcas Carrow, MD   5 mg at 12/26/22 1715   haloperidol lactate (HALDOL) injection 2 mg  2 mg Intravenous Q4H PRN Dorcas Carrow, MD   2 mg at 12/27/22 0406   HYDROmorphone (DILAUDID) injection 0.5-1 mg  0.5-1 mg Intravenous Q2H PRN Jacinto Halim, PA-C   1 mg at 12/24/22 1941   methocarbamol (ROBAXIN) 1,000 mg in dextrose 5 % 100 mL IVPB  1,000 mg Intravenous Q8H Jacinto Halim, PA-C 220 mL/hr at 12/27/22 0558 1,000 mg at 12/27/22 0558   metoprolol tartrate (LOPRESSOR) injection 5 mg  5 mg Intravenous Q2H PRN Amin, Loura Halt, MD   5 mg at 12/24/22 0900   metoprolol tartrate (LOPRESSOR) tablet 75 mg  75 mg Oral BID Laverda Page B, NP   75 mg at 12/26/22 2116   ondansetron (ZOFRAN) tablet 4 mg  4 mg Oral Q6H PRN Eric Form, PA-C       Or   ondansetron [facility]) injection 4 mg  4 mg Intravenous Q6H PRN Eric Form, PA-C   4 mg at 12/26/22 2250   pantoprazole (PROTONIX) injection 40 mg  40 mg Intravenous Q2200 Carollee Herter, DO       scopolamine (TRANSDERM-SCOP) 1 MG/3DAYS 1.5 mg  1 patch Transdermal Q72H Howerter, Justin B, DO   1.5 mg at 12/26/22 2116   sodium chloride flush (NS) 0.9 % injection 10-40 mL  10-40 mL Intracatheter Q12H Eric Form, PA-C   10 mL at 12/26/22 2117   sodium chloride flush (NS) 0.9 % injection 10-40 mL  10-40 mL  Intracatheter PRN Eric Form, PA-C   10 mL at 12/25/22 1742   TPN ADULT (ION)   Intravenous Continuous TPN Gerilyn Nestle, RPH 95 mL/hr at 12/26/22 1747 New Bag at 12/26/22 1747   traZODone (DESYREL) tablet 50 mg  50 mg Oral QHS Dorcas Carrow, MD   50 mg at 12/26/22 2116     Discharge Medications: Please see discharge summary for a list of discharge medications.  Relevant Imaging Results:  Relevant Lab Results:   Additional Information [facility] [id number]  Oletta Lamas, MSW, LCSWA, LCASA Transitions of Care  Clinical Social Worker I

## 2022-12-28 DIAGNOSIS — K529 Noninfective gastroenteritis and colitis, unspecified: Secondary | ICD-10-CM | POA: Diagnosis not present

## 2022-12-28 DIAGNOSIS — I4811 Longstanding persistent atrial fibrillation: Secondary | ICD-10-CM | POA: Diagnosis not present

## 2022-12-28 LAB — BASIC METABOLIC PANEL
Anion gap: 7 (ref 5–15)
BUN: 22 mg/dL (ref 8–23)
CO2: 21 mmol/L — ABNORMAL LOW (ref 22–32)
Calcium: 8.6 mg/dL — ABNORMAL LOW (ref 8.9–10.3)
Chloride: 106 mmol/L (ref 98–111)
Creatinine, Ser: 0.83 mg/dL (ref 0.61–1.24)
GFR, Estimated: >60 mL/min (ref >60)
Glucose, Bld: 140 mg/dL — ABNORMAL HIGH (ref 70–99)
Potassium: 4.4 mmol/L (ref 3.5–5.1)
Sodium: 134 mmol/L — ABNORMAL LOW (ref 135–145)

## 2022-12-28 LAB — PHOSPHORUS: Phosphorus: 4 mg/dL (ref 2.5–4.6)

## 2022-12-28 MED ORDER — TRAVASOL 10 % IV SOLN
INTRAVENOUS | Status: AC
  Filled 2022-12-28: qty 1248

## 2022-12-28 MED ORDER — ACETAMINOPHEN 500 MG PO TABS
1000.0000 mg | ORAL_TABLET | Freq: Four times a day (QID) | ORAL | Status: DC | PRN
  Filled 2022-12-28 (×2): qty 2

## 2022-12-28 MED ORDER — ACETAMINOPHEN 500 MG PO TABS
1000.0000 mg | ORAL_TABLET | Freq: Four times a day (QID) | ORAL | Status: DC
  Administered 2022-12-28: 1000 mg via ORAL
  Filled 2022-12-28: qty 2

## 2022-12-28 MED ORDER — QUETIAPINE FUMARATE 25 MG PO TABS
25.0000 mg | ORAL_TABLET | Freq: Every day | ORAL | Status: DC
  Administered 2022-12-28: 25 mg via ORAL
  Filled 2022-12-28: qty 1

## 2022-12-28 MED ORDER — TRAMADOL HCL 50 MG PO TABS
50.0000 mg | ORAL_TABLET | Freq: Four times a day (QID) | ORAL | Status: DC | PRN
  Administered 2022-12-29: 50 mg via ORAL
  Filled 2022-12-28: qty 1

## 2022-12-28 NOTE — Evaluation (Signed)
Physical Therapy Evaluation Patient Details Name: Jonathon Parker Jonathon Parker MRN: [id number] DOB: June 29, 1951 Today's Date: 12/28/2022  History of Present Illness  72 y.o. male presented to the ED 12/14/22 with with worsening abdominal pain and confusion. Pt found to have perforated bowel and underwent bowel resection on 5/15. Post op complications include delirium, afib, and prolonged ileus. PMH - HTN, R thalamic CVA, colon CA, CLL, GERD, Depression, enteritis, sepsis, mild cognitive impairment, pacer.  Clinical Impression  Pt admitted with above diagnosis and presents to PT with functional limitations due to deficits listed below (See PT problem list). Pt needs skilled PT to maximize independence and safety. Pt with improved mobility since last seen by PT prior to decline. Today able to fully participate and assisted OOB and able to amb short distance with 2 person assist. Patient will benefit from continued post acute therapy, <3 hours/day          Recommendations for follow up therapy are one component of a multi-disciplinary discharge planning process, led by the attending physician.  Recommendations may be updated based on patient status, additional functional criteria and insurance authorization.  Follow Up Recommendations Can patient physically be transported by private vehicle: No     Assistance Recommended at Discharge Frequent or constant Supervision/Assistance  Patient can return home with the following  Two people to help with walking and/or transfers;Two people to help with bathing/dressing/bathroom;Direct supervision/assist for financial management;Direct supervision/assist for medications management;Assistance with cooking/housework;Help with stairs or ramp for entrance    Equipment Recommendations Other (comment) (To be determined at next venue)  Recommendations for Other Services       Functional Status Assessment Patient has had a recent decline in their functional status and  demonstrates the ability to make significant improvements in function in a reasonable and predictable amount of time.     Precautions / Restrictions Precautions Precautions: Fall      Mobility  Bed Mobility Overal bed mobility: Needs Assistance Bed Mobility: Supine to Sit     Supine to sit: +2 for physical assistance, Min assist     General bed mobility comments: Assist to bring legs off of bed, elevate trunk into sitting and bring hips to EOB.    Transfers Overall transfer level: Needs assistance Equipment used: Rolling walker (2 wheels) Transfers: Sit to/from Stand, Bed to chair/wheelchair/BSC Sit to Stand: +2 physical assistance, Min assist   Step pivot transfers: +2 physical assistance, Mod assist       General transfer comment: Assist to bring hips up and for balance. On stand from bed had pt place hands on walker to encourage anterior weight shift. From chair pt able to push up with hands on armrests. Bed to chair with small pivotal steps with assist for balance and support.    Ambulation/Gait Ambulation/Gait assistance: Min assist, +2 safety/equipment Gait Distance (Feet): 5 Feet Assistive device: Rolling walker (2 wheels) Gait Pattern/deviations: Step-to pattern, Decreased step length - right, Decreased step length - left, Shuffle, Trunk flexed Gait velocity: decr Gait velocity interpretation: <1.31 ft/sec, indicative of household ambulator   General Gait Details: Assist for balance and support  Information systems manager [place] (Stroke Patients Only)       Balance Overall balance assessment: Needs assistance Sitting-balance support: Bilateral upper extremity supported, Feet supported Sitting balance-Leahy Scale: Poor Sitting balance - Comments: Min assist to sit EOB due to posterior lean Postural control: Posterior lean Standing balance support:  Bilateral upper extremity supported Standing balance-Leahy Scale:  Poor Standing balance comment: walker and min assist for static standing                             Pertinent Vitals/Pain Pain Assessment Pain Assessment: Faces Faces Pain Scale: Hurts little more Pain Location: abdomen Pain Descriptors / Indicators: Sore Pain Intervention(s): Limited activity within patient's tolerance, Monitored during session    Home Living Family/patient expects to be discharged to:: Private residence Living Arrangements: Spouse/significant other Available Help at Discharge: Family;Available 24 hours/day Type of Home: House Home Access: Stairs to enter Entrance Stairs-Rails: Right;Can reach both;Left Entrance Stairs-Number of Steps: 7 back entrance Alternate Level Stairs-Number of Steps: 1 flight Home Layout: Two level;Able to live on main level with bedroom/bathroom Home Equipment: Rolling Walker (2 wheels);Cane - single point;Wheelchair - manual;Cane - quad;Shower seat;Hand held shower head;Other (comment)      Prior Function Prior Level of Function : Independent/Modified Independent;History of Falls (last six months)             Mobility Comments: Mod I using RW in community, no AD in home ADLs Comments: independent  in bathing and dressing     Hand Dominance   Dominant Hand: Right    Extremity/Trunk Assessment   Upper Extremity Assessment Upper Extremity Assessment: Generalized weakness    Lower Extremity Assessment Lower Extremity Assessment: Generalized weakness       Communication   Communication: No difficulties  Cognition Arousal/Alertness: Awake/alert Behavior During Therapy: WFL for tasks assessed/performed Overall Cognitive Status: Impaired/Different from baseline Area of Impairment: Orientation, Attention, Memory, Following commands, Safety/judgement, Problem solving, Awareness                 Orientation Level: Disoriented to, Time, Situation, Place Current Attention Level: Sustained Memory: Decreased  recall of precautions, Decreased short-term memory Following Commands: Follows one step commands consistently, Follows one step commands with increased time Safety/Judgement: Decreased awareness of safety, Decreased awareness of deficits Awareness: Intellectual Problem Solving: Slow processing, Decreased initiation, Difficulty sequencing, Requires tactile cues, Requires verbal cues          General Comments General comments (skin integrity, edema, etc.): VSS on RA    Exercises     Assessment/Plan    PT Assessment Patient needs continued PT services  PT Problem List Decreased strength;Decreased activity tolerance;Decreased balance;Decreased mobility;Decreased cognition;Decreased safety awareness       PT Treatment Interventions DME instruction;Gait training;Functional mobility training;Therapeutic activities;Therapeutic exercise;Balance training;Cognitive remediation;Patient/family education    PT Goals (Current goals can be found in the Care Plan section)  Acute Rehab PT Goals Patient Stated Goal: to get back to baseline PT Goal Formulation: With patient/family Time For Goal Achievement: 01/11/23 Potential to Achieve Goals: Good    Frequency Min 1X/week     Co-evaluation               AM-PAC PT "6 Clicks" Mobility  Outcome Measure Help needed turning from your back to your side while in a flat bed without using bedrails?: A Lot Help needed moving from lying on your back to sitting on the side of a flat bed without using bedrails?: A Lot Help needed moving to and from a bed to a chair (including a wheelchair)?: Total Help needed standing up from a chair using your arms (e.g., wheelchair or bedside chair)?: Total Help needed to walk in Parker room?: Total Help needed climbing 3-5 steps with a railing? : Total  6 Click Score: 8    End of Session Equipment Utilized During Treatment: Gait belt Activity Tolerance: Patient tolerated treatment well Patient left: in  chair;with call bell/phone within reach;with chair alarm set;with family/visitor present Nurse Communication: Mobility status;Need for lift equipment (could use Stedy if needed) PT Visit Diagnosis: Other abnormalities of gait and mobility (R26.89);Muscle weakness (generalized) (M62.81)    Time: 1610-9604 PT Time Calculation (min) (ACUTE ONLY): 19 min   Charges:   PT Evaluation $PT Eval Moderate Complexity: 1 Mod          [place] [facility] PT Acute Rehabilitation Services Office [phone]   Angelina Ok [facility] 12/28/2022, 11:25 AM

## 2022-12-28 NOTE — Progress Notes (Signed)
PROGRESS NOTE    Jonathon Parker [facility]  ZOX:[id number] DOB: 1951/01/03 DOA: 12/14/2022 PCP: Paulina Fusi, MD    Brief Narrative:  72 year old gentleman with history of CLL, paroxysmal A-fib on Eliquis, history of colon cancer and left hemicolectomy more than 10 years ago, GERD who was suffering from enteritis and recently on oral antibiotics came to the emergency room with worsening abdominal pain and confusion associated with watery stools.  Workup in the emergency room revealed perforated diverticulitis of the small bowel in the right upper quadrant.  Started on IV antibiotics and resuscitation.  Patient underwent surgical exploration on 5/15.  Postoperative recovery complicated with delirium, A-fib and prolonged ileus. 5/22, with florid delirium features. 5/23, some improvement of symptoms.  Using haloperidol.  Recurrent nausea.   Assessment & Plan:   Perforated diverticulitis of the small bowel: Exploratory laparotomy lysis of adhesion and small bowel resection with primary anastomosis 5/15 Prolonged ileus.  Now with return of bowel function, however poor tolerance of diet. Remains on TPN due to poor oral intake, pharmacy managing. Continue mobility. Completed antibiotics after the procedure. Hopefully he can tolerate diet and can come off TPN next few days. Will advance to soft diet so patient can choose what he wants to eat.  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 Was on heparin perioperative.  No evidence of ongoing bleeding.   Now on Eliquis.  Rate controlled on metoprolol. Echocardiogram with ejection fraction 55 to 60%.  Acute blood loss anemia on chronic macrocytic anemia: Baseline hemoglobin about 12. Postop hemoglobin 7.6-7.2-1 unit PRBC 8-1 additional unit of PRBC-hemoglobin more than 10.  Monitor closely.  Hospital-acquired delirium: Patient with underlying history of dementia.  He has developed significant  tremulousness and confusion. Florid delirium 5/22 responded to haloperidol. CT head was essentially normal.  Ammonia was normal. Using haloperidol by mouth and occasionally by injection. Avoid benzodiazepines or narcotics. High-dose Tylenol for pain. In order to help transition for outpatient management, will start patient on Seroquel tonight instead of Haldol and monitor the response. Continue trazodone 100 mg at night.  Depression, dementia without behavioral disturbances: On Wellbutrin, Aricept .  As above.  Obstructive sleep apnea: Use CPAP as tolerated.  Patient is usually declining.   DVT prophylaxis: Place and maintain sequential compression device Start: 12/16/22 1321 apixaban (ELIQUIS) tablet 5 mg   Code Status: Full code Family Communication: Wife at the bedside. Disposition Plan: Status is: Inpatient Remains inpatient appropriate because: Significant delirium, TPN, intolerance to diet.     Consultants:  Cardiology Surgery Palliative care,   Procedures:  Ex lap 5/50  Antimicrobials:  Completed Zosyn   Subjective:  Patient seen and examined.  He thinks he should be going home today.  Wife at the bedside.  He slept well half of the night and had some confusion early morning and wanted to go to his granddaughter's game.  Patient denies any nausea.  He had episode of vomiting yesterday at lunch however no vomiting since then.  Working with physical therapy. We discussed about transitioning him to atypical antipsychotic like Seroquel that can be used at the skilled nursing facility.   Objective: Vitals:   12/28/22 0122 12/28/22 0350 12/28/22 0818 12/28/22 0928  BP: 127/72 125/69 102/86 111/73  Pulse: 63 60 69 72  Resp: 19 20 18    Temp: 98.2 F (36.8 C) 98.1 F (36.7 C) 98.2 F (36.8 C)   TempSrc: Oral Oral Oral   SpO2: 97% 94% 94%   Weight:  88.2 kg    Height:        Intake/Output Summary (Last 24 hours) at 12/28/2022 1114 Last data filed at 12/28/2022  0819 Gross per 24 hour  Intake 430.27 ml  Output 3750 ml  Net -3319.73 ml    Filed Weights   12/26/22 0500 12/27/22 0335 12/28/22 0350  Weight: 87.5 kg 88.5 kg 88.2 kg    Examination:  General: Frail and debilitated.  Looks comfortable.  Alert awake and mostly oriented, occasionally impulsive.   Very debilitated. Does not have any focal neurological deficits.  Moves all extremities equally. Cardiovascular: S1-S2 normal.  Irregularly irregular.  Tachycardic. Respiratory: Bilateral clear.  No added sounds. Gastrointestinal: Soft.  Midline incision with intact staples.  Nontender.  No rigidity or guarding. Ext: No edema.     Data Reviewed: I have personally reviewed following labs and imaging studies  CBC: Recent Labs  Lab 12/23/22 0507 12/23/22 1229 12/24/22 0525 12/25/22 0418 12/25/22 2050 12/26/22 0550 12/27/22 1103  WBC 4.9  --  6.9 6.4  --  7.5 7.8  HGB 7.6*   < > 7.2* 8.5* 8.0* 8.6* 10.7*  HCT 23.9*  --  22.4* 25.8* 25.2* 25.6* 32.6*  MCV 103.5*  --  103.2* 98.5  --  98.5 96.7  PLT 191  --  217 186  --  176 169   < > = values in this interval not displayed.    Basic Metabolic Panel: Recent Labs  Lab 12/22/22 0540 12/23/22 0507 12/25/22 0947 12/26/22 0550 12/28/22 0041  NA 138 135 139 135 134*  K 4.2 3.9 3.9 4.2 4.4  CL 101 101 106 106 106  CO2 28 27 23 22  21*  GLUCOSE 152* 176* 124* 110* 140*  BUN 22 23 25* 22 22  CREATININE 1.15 0.73 0.74 0.78 0.83  CALCIUM 8.7* 8.6* 8.7* 8.8* 8.6*  MG 1.8 2.0  --  2.1  --   PHOS 1.7* 2.8  --  3.9 4.0    GFR: Estimated Creatinine Clearance: 89.7 mL/min (by C-G formula based on SCr of 0.83 mg/dL). Liver Function Tests: Recent Labs  Lab 12/23/22 0507 12/26/22 0550  AST 35 27  ALT 42 45*  ALKPHOS 163* 192*  BILITOT 0.8 0.8  PROT 5.7* 5.8*  ALBUMIN 2.4* 3.0*    No results for input(s): "LIPASE", "AMYLASE" in the last 168 hours. Recent Labs  Lab 12/25/22 0946  AMMONIA 25    Coagulation Profile: No  results for input(s): "INR", "PROTIME" in the last 168 hours. Cardiac Enzymes: No results for input(s): "CKTOTAL", "CKMB", "CKMBINDEX", "TROPONINI" in the last 168 hours. BNP (last 3 results) No results for input(s): "PROBNP" in the last 8760 hours. HbA1C: No results for input(s): "HGBA1C" in the last 72 hours. CBG: Recent Labs  Lab 12/25/22 0533 12/25/22 1122 12/25/22 1542 12/25/22 2309 12/26/22 0600  GLUCAP 132* 129* 128* 150* 108*    Lipid Profile: No results for input(s): "CHOL", "HDL", "LDLCALC", "TRIG", "CHOLHDL", "LDLDIRECT" in the last 72 hours.  Thyroid Function Tests: No results for input(s): "TSH", "T4TOTAL", "FREET4", "T3FREE", "THYROIDAB" in the last 72 hours. Anemia Panel: No results for input(s): "VITAMINB12", "FOLATE", "FERRITIN", "TIBC", "IRON", "RETICCTPCT" in the last 72 hours. Sepsis Labs: No results for input(s): "PROCALCITON", "LATICACIDVEN" in the last 168 hours.   No results found for this or any previous visit (from the past 240 hour(s)).        Radiology Studies: DG Abd 2 Views  Result Date: 12/27/2022 CLINICAL DATA:  Ileus. EXAM: ABDOMEN -  2 VIEW COMPARISON:  12/22/2022 FINDINGS: Bowel gas throughout the abdomen and pelvis with gas in both the small and large bowel. The colonic gaseous distension is decreased from the previous examination. Small bowel gaseous distension is upper limits of normal. Overall, this is a nonobstructive pattern. Postoperative changes in the abdomen and pelvis. No evidence for free air on the left lateral decubitus images. IMPRESSION: Bowel gas in the abdomen and pelvis with a nonobstructive pattern. Cannot exclude an ileus pattern although the colonic distension has decreased since 12/22/2022. Electronically Signed   By: Richarda Overlie M.D.   On: 12/27/2022 14:50        Scheduled Meds:  alum & mag hydroxide-simeth  30 mL Oral Once   apixaban  5 mg Oral BID   atorvastatin  20 mg Oral Daily   buPROPion  300 mg Oral Daily    Chlorhexidine Gluconate Cloth  6 each Topical Daily   colestipol  1 g Oral Daily   donepezil  5 mg Oral Daily   Gerhardt's butt cream   Topical QID   metoprolol tartrate  75 mg Oral BID   pantoprazole (PROTONIX) IV  40 mg Intravenous Q2200   QUEtiapine  25 mg Oral QHS   scopolamine  1 patch Transdermal Q72H   sodium chloride flush  10-40 mL Intracatheter Q12H   traZODone  100 mg Oral QHS   Continuous Infusions:  sodium chloride Stopped (12/26/22 1410)   methocarbamol (ROBAXIN) IV 1,000 mg (12/28/22 0559)   promethazine (PHENERGAN) injection (IM or IVPB) 12.5 mg (12/27/22 2215)   TPN ADULT (ION) 95 mL/hr at 12/27/22 1754   TPN ADULT (ION)       LOS: 13 days    Time spent: 40 minutes     Dorcas Carrow, MD [facility] Pager [phone]

## 2022-12-28 NOTE — Progress Notes (Signed)
10 Days Post-Op   Subjective/Chief Complaint: No complaints. Confused at times. Family thinks the delirium and tremors are worse after narcotics   Objective: Vital signs in last 24 hours: Temp:  [97.7 F (36.5 C)-98.2 F (36.8 C)] 98.2 F (36.8 C) (05/25 0818) Pulse Rate:  [60-82] 69 (05/25 0818) Resp:  [18-20] 18 (05/25 0818) BP: (102-152)/(69-94) 102/86 (05/25 0818) SpO2:  [94 %-99 %] 94 % (05/25 0818) Weight:  [88.2 kg] 88.2 kg (05/25 0350) Last BM Date : 12/27/22  Intake/Output from previous day: 05/24 0701 - 05/25 0700 In: 430.3 [P.O.:120; I.V.:160.3; IV Piggyback:150] Out: 2950 [Urine:2950] Intake/Output this shift: Total I/O In: -  Out: 800 [Urine:800]  General appearance: alert and cooperative Resp: clear to auscultation bilaterally Cardio: irregularly irregular rhythm GI: soft, mild tenderness. Good bs. Incision ok  Lab Results:  Recent Labs    12/26/22 0550 12/27/22 1103  WBC 7.5 7.8  HGB 8.6* 10.7*  HCT 25.6* 32.6*  PLT 176 169   BMET Recent Labs    12/26/22 0550 12/28/22 0041  NA 135 134*  K 4.2 4.4  CL 106 106  CO2 22 21*  GLUCOSE 110* 140*  BUN 22 22  CREATININE 0.78 0.83  CALCIUM 8.8* 8.6*   PT/INR No results for input(s): "LABPROT", "INR" in the last 72 hours. ABG No results for input(s): "PHART", "HCO3" in the last 72 hours.  Invalid input(s): "PCO2", "PO2"  Studies/Results: DG Abd 2 Views  Result Date: 12/27/2022 CLINICAL DATA:  Ileus. EXAM: ABDOMEN - 2 VIEW COMPARISON:  12/22/2022 FINDINGS: Bowel gas throughout the abdomen and pelvis with gas in both the small and large bowel. The colonic gaseous distension is decreased from the previous examination. Small bowel gaseous distension is upper limits of normal. Overall, this is a nonobstructive pattern. Postoperative changes in the abdomen and pelvis. No evidence for free air on the left lateral decubitus images. IMPRESSION: Bowel gas in the abdomen and pelvis with a nonobstructive  pattern. Cannot exclude an ileus pattern although the colonic distension has decreased since 12/22/2022. Electronically Signed   By: Richarda Overlie M.D.   On: 12/27/2022 14:50    Anti-infectives: Anti-infectives (From admission, onward)    Start     Dose/Rate Route Frequency Ordered Stop   12/19/22 2000  piperacillin-tazobactam (ZOSYN) IVPB 3.375 g        3.375 g 12.5 mL/hr over 240 Minutes Intravenous Every 8 hours 12/19/22 1550 12/23/22 2359   12/16/22 1100  piperacillin-tazobactam (ZOSYN) IVPB 3.375 g  Status:  Discontinued        3.375 g 12.5 mL/hr over 240 Minutes Intravenous Every 8 hours 12/16/22 0954 12/19/22 1550   12/16/22 0400  ampicillin-sulbactam (UNASYN) 1.5 g in sodium chloride 0.9 % 100 mL IVPB  Status:  Discontinued        1.5 g 200 mL/hr over 30 Minutes Intravenous Every 6 hours 12/16/22 0258 12/16/22 0855   12/15/22 1630  piperacillin-tazobactam (ZOSYN) IVPB 3.375 g  Status:  Discontinued        3.375 g 12.5 mL/hr over 240 Minutes Intravenous Every 8 hours 12/15/22 1528 12/16/22 0307   12/15/22 1000  azithromycin (ZITHROMAX) tablet 500 mg  Status:  Discontinued        500 mg Oral Daily 12/15/22 0900 12/16/22 0258   12/15/22 0045  piperacillin-tazobactam (ZOSYN) IVPB 3.375 g        3.375 g 12.5 mL/hr over 240 Minutes Intravenous  Once 12/15/22 0044 12/15/22 0446       Assessment/Plan:  s/p Procedure(s): EXPLORATORY LAPAROTOMY, BOWEL RESECTION (N/A) Advance diet as tolerated Will try to minimize narcotics  Hg stable Continue tpn Afib per cards POD 10 s/p Exploratory laparotomy, lysis of adhesions, small bowel resection with primary anastomosis for Contained perforation of jejunal diverticulum by Dr. Corliss Skains on 12/18/22 - Path benign - NG removed by patient, will try without it. If he has n/v needs replaced (discussed with nurse) - Cont TPN - IV Tylenol and Robaxin while NGT in place.  - Cont abx, plan 5d post op - Mobilize - PT/OT - Pulm toilet, IS, wean o2 as  able -afib. Cardizem drip per Cardiology. Rate needs to be controlled before he will participate in therapy   FEN - NPO, PICC/TPN VTE - SCDs, heparin gtt.  ID - Zosyn Foley - Out POD 1, voiding.     CLL (last chemo in November but on venetoclax)  Recent resp issues - bronchitis, pneumonia, RSV  A. Fib/Bradycardia - pacemaker placement about a month ago   LOS: 13 days    Jonathon Parker 12/28/2022

## 2022-12-28 NOTE — Progress Notes (Signed)
PHARMACY - TOTAL PARENTERAL NUTRITION CONSULT NOTE  Indication: Small bowel diverticula perforation   Patient Measurements: Height: 6' 0.5" (184.2 cm) Weight: 88.2 kg (194 lb 7.1 oz) IBW/kg (Calculated) : 78.75 TPN AdjBW (KG): 95.3 Body mass index is 26.01 kg/m.  Assessment:  67 YOM presented to [facility] on 5/11 with fevers, abdominal pain and confusion.  PMH significant for CLL, and colon cancer post L hemicolectomy.  Recent history of pacemaker placement for Afib/brady and enteritis.  CT showed small bowel diverticulitis vs enteritis vs SBO with ischemia.  Patient was on a CLD on 5/13 and began having increased abdominal pain.  Surgery plans to perform an ex-lap with SBR.  Patient reports eating two meals a day and intake was reduced to ~50% given his abdominal pain that started months ago.  Patient feels that he lost weight but is unsure of how much.  Glucose / Insulin: no hx DM - CBGs < 180.  SSI D/C'ed Electrolytes: low Na/CO2, others WNL Renal: SCr < 1, BUN WNL Hepatic: AST WNL, AST mildly elevated at 45, alk phos up to 192, tbili normalized, albumin 3 Intake / Output; MIVF: UOP 1.4 ml/kg/hr, vomited x1 5/24, LBM 5/24 GI Imaging:  5/19 portable X-ray shows nonobstructive bowel gas pattern 5/24 abx XR: cannot exclude ileus, colonic distension has decreased GI Surgeries / Procedures:  5/15 ex-lap with LoA, SBR with primary anastomosis  Central access: PICC placed 12/17/22 TPN start date: 12/18/22  Nutritional Goals: RD Estimated Needs Total Energy Estimated Needs: 2400-2600 Total Protein Estimated Needs: 120-135g Total Fluid Estimated Needs: >/=2L  Current Nutrition:  TPN FLD - consume up to 50% of meals (nausea received PRN Phenergan)  Plan:  Increase TPN rate in order to adjust electrolytes TPN at goal rate of 100 mL/hr will provide 125g AA and 2426 kCal, meeting 100% of needs Electrolytes in TPN: increase Na to 125mEq/L, reduce K to 66mEq/L, Ca 5 mEq/L, Mg 57mEq/L,  reduce Phos further to 57mmol/L, change Cl:Ac to 1:2 Add standard MVI and trace elements to TPN Standard TPN labs on Mon and Thurs  F/U with mentation/diet advancement to begin weaning TPN  Jonathon Parker Jonathon Parker, PharmD, BCPS, BCCCP 12/28/2022, 9:44 AM

## 2022-12-29 DIAGNOSIS — I4811 Longstanding persistent atrial fibrillation: Secondary | ICD-10-CM | POA: Diagnosis not present

## 2022-12-29 DIAGNOSIS — Z515 Encounter for palliative care: Secondary | ICD-10-CM

## 2022-12-29 DIAGNOSIS — K529 Noninfective gastroenteritis and colitis, unspecified: Secondary | ICD-10-CM | POA: Diagnosis not present

## 2022-12-29 LAB — VITAMIN B12: Vitamin B-12: 566 pg/mL (ref 180–914)

## 2022-12-29 LAB — TSH: TSH: 1.159 u[IU]/mL (ref 0.350–4.500)

## 2022-12-29 LAB — AMMONIA: Ammonia: 35 umol/L (ref 9–35)

## 2022-12-29 MED ORDER — TRAVASOL 10 % IV SOLN
INTRAVENOUS | Status: AC
  Filled 2022-12-29: qty 1248

## 2022-12-29 MED ORDER — HALOPERIDOL 5 MG PO TABS
5.0000 mg | ORAL_TABLET | Freq: Once | ORAL | Status: AC
  Administered 2022-12-29: 5 mg via ORAL
  Filled 2022-12-29: qty 1

## 2022-12-29 MED ORDER — HALOPERIDOL 5 MG PO TABS
5.0000 mg | ORAL_TABLET | Freq: Two times a day (BID) | ORAL | Status: DC
  Administered 2022-12-29: 5 mg via ORAL
  Filled 2022-12-29 (×3): qty 1

## 2022-12-29 MED ORDER — ACETAMINOPHEN 10 MG/ML IV SOLN
1000.0000 mg | Freq: Four times a day (QID) | INTRAVENOUS | Status: DC
  Administered 2022-12-29 – 2022-12-30 (×3): 1000 mg via INTRAVENOUS
  Filled 2022-12-29 (×4): qty 100

## 2022-12-29 MED ORDER — HALOPERIDOL LACTATE 5 MG/ML IJ SOLN: 5.0000 mg | INTRAMUSCULAR | Status: DC | PRN

## 2022-12-29 MED ORDER — HALOPERIDOL LACTATE 5 MG/ML IJ SOLN
2.5000 mg | INTRAMUSCULAR | Status: DC | PRN
  Administered 2022-12-30 – 2022-12-31 (×3): 2.5 mg via INTRAVENOUS
  Filled 2022-12-29 (×6): qty 1

## 2022-12-29 MED ORDER — PHENOL 1.4 % MT LIQD
1.0000 | OROMUCOSAL | Status: DC | PRN
  Administered 2022-12-29 – 2023-01-02 (×3): 1 via OROMUCOSAL
  Filled 2022-12-29: qty 177

## 2022-12-29 MED ORDER — ENSURE ENLIVE PO LIQD
237.0000 mL | Freq: Three times a day (TID) | ORAL | Status: DC
  Administered 2022-12-29 – 2022-12-30 (×2): 237 mL via ORAL

## 2022-12-29 NOTE — Progress Notes (Addendum)
11 Days Post-Op   Subjective/Chief Complaint: Delirium is worse this am. Passing flatus   Objective: Vital signs in last 24 hours: Temp:  [98.2 F (36.8 C)-98.5 F (36.9 C)] 98.3 F (36.8 C) (05/26 0754) Pulse Rate:  [68-100] 100 (05/26 0801) Resp:  [18-23] 23 (05/26 0754) BP: (114-130)/(65-94) 118/65 (05/26 0754) SpO2:  [95 %-97 %] 97 % (05/26 0754) Weight:  [89.5 kg] 89.5 kg (05/26 0541) Last BM Date : 12/27/22  Intake/Output from previous day: 05/25 0701 - 05/26 0700 In: -  Out: 2550 [Urine:2550] Intake/Output this shift: Total I/O In: 60 [P.O.:60] Out: -   General appearance: delirious Resp: clear to auscultation bilaterally Cardio: irregularly irregular rhythm GI: soft, mild tenderness. Incision ok  Lab Results:  Recent Labs    12/27/22 1103  WBC 7.8  HGB 10.7*  HCT 32.6*  PLT 169   BMET Recent Labs    12/28/22 0041  NA 134*  K 4.4  CL 106  CO2 21*  GLUCOSE 140*  BUN 22  CREATININE 0.83  CALCIUM 8.6*   PT/INR No results for input(s): "LABPROT", "INR" in the last 72 hours. ABG No results for input(s): "PHART", "HCO3" in the last 72 hours.  Invalid input(s): "PCO2", "PO2"  Studies/Results: No results found.  Anti-infectives: Anti-infectives (From admission, onward)    Start     Dose/Rate Route Frequency Ordered Stop   12/19/22 2000  piperacillin-tazobactam (ZOSYN) IVPB 3.375 g        3.375 g 12.5 mL/hr over 240 Minutes Intravenous Every 8 hours 12/19/22 1550 12/23/22 2359   12/16/22 1100  piperacillin-tazobactam (ZOSYN) IVPB 3.375 g  Status:  Discontinued        3.375 g 12.5 mL/hr over 240 Minutes Intravenous Every 8 hours 12/16/22 0954 12/19/22 1550   12/16/22 0400  ampicillin-sulbactam (UNASYN) 1.5 g in sodium chloride 0.9 % 100 mL IVPB  Status:  Discontinued        1.5 g 200 mL/hr over 30 Minutes Intravenous Every 6 hours 12/16/22 0258 12/16/22 0855   12/15/22 1630  piperacillin-tazobactam (ZOSYN) IVPB 3.375 g  Status:  Discontinued         3.375 g 12.5 mL/hr over 240 Minutes Intravenous Every 8 hours 12/15/22 1528 12/16/22 0307   12/15/22 1000  azithromycin (ZITHROMAX) tablet 500 mg  Status:  Discontinued        500 mg Oral Daily 12/15/22 0900 12/16/22 0258   12/15/22 0045  piperacillin-tazobactam (ZOSYN) IVPB 3.375 g        3.375 g 12.5 mL/hr over 240 Minutes Intravenous  Once 12/15/22 0044 12/15/22 0446       Assessment/Plan: s/p Procedure(s): EXPLORATORY LAPAROTOMY, BOWEL RESECTION (N/A) Advance diet. Tolerating small amounts of food when delirium is less Seems to be stable from surgical standpoint Hg stable Continue tpn Afib per cards POD 11 s/p Exploratory laparotomy, lysis of adhesions, small bowel resection with primary anastomosis for Contained perforation of jejunal diverticulum by Dr. Corliss Skains on 12/18/22 - Path benign - wean TPN as he tolerates more food - IV Tylenol and Robaxin while NGT in place.  - Cont abx, plan 5d post op - Mobilize - PT/OT - Pulm toilet, IS, wean o2 as able -afib per Cardiology. Rate needs to be controlled before he will participate in therapy   FEN - NPO, PICC/TPN VTE - SCDs, heparin gtt.  ID - Zosyn Foley - Out POD 1, voiding.     CLL (last chemo in November but on venetoclax)  Recent resp issues -  bronchitis, pneumonia, RSV  A. Fib/Bradycardia - pacemaker placement about a month ago   LOS: 14 days    Jonathon Parker 12/29/2022

## 2022-12-29 NOTE — Evaluation (Addendum)
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 Valentino Hue in [place] [facility]     Depression    GERD (gastroesophageal reflux disease)    controlled with nexium    Hypertension    Pacemaker    Past Surgical History:  Past Surgical History:  Procedure Laterality Date   APPENDECTOMY     CARPAL TUNNEL RELEASE     left    CHOLECYSTECTOMY N/A 04/20/2019   Procedure: LAPAROSCOPIC CHOLECYSTECTOMY;  Surgeon: [name], MD;  Location: [facility] ORS;  Service: General;  Laterality: N/A;   COLON SURGERY  2002   10 inches of colon taken out    COLONOSCOPY     ENDOSCOPIC RETROGRADE CHOLANGIOPANCREATOGRAPHY (ERCP) WITH PROPOFOL N/A 03/01/2019   Procedure: ENDOSCOPIC RETROGRADE CHOLANGIOPANCREATOGRAPHY (ERCP) WITH PROPOFOL;  Surgeon: [name]., MD;  Location: [facility] ENDOSCOPY;  Service: Gastroenterology;  Laterality: N/A;   ERCP  03/01/2019   HERNIA REPAIR     bilateral inguinal    IR CHOLANGIOGRAM EXISTING TUBE  03/25/2019   IR PERC CHOLECYSTOSTOMY  03/02/2019   LAPAROTOMY N/A 12/18/2022   Procedure: EXPLORATORY LAPAROTOMY, BOWEL RESECTION;  Surgeon: [name], MD;  Location: [facility] OR;  Service: General;  Laterality: N/A;   left heel reconstruction  01/2002   17 screws and 2 plates    PACEMAKER INSERTION     POLYPECTOMY     REMOVAL OF STONES  03/01/2019   Procedure: REMOVAL OF STONES;  Surgeon: [name]., MD;  Location: [facility] ENDOSCOPY;  Service: Gastroenterology;;   [name]  03/01/2019   Procedure: SPHINCTEROTOMY;  Surgeon: [name]., MD;  Location: [facility] ENDOSCOPY;  Service: Gastroenterology;;   tooth extracted     with laughing gas    UPPER GASTROINTESTINAL ENDOSCOPY     HPI:  72 year old gentleman with history of CLL, paroxysmal A-fib on Eliquis, history of colon cancer and left hemicolectomy more than 10 years ago, GERD who was suffering from enteritis and recently on oral antibiotics came to the emergency room with worsening abdominal pain and confusion associated with watery stools.  Workup revealed perforated diverticulitis of the small bowel in the right upper quadrant. Patient underwent surgical exploration on 5/15.  Postoperative recovery complicated with delirium, A-fib and prolonged ileus. Pt on soft diet and ST ordered possibly due to delirium.    Assessment / Plan / Recommendation  Clinical Impression  Pt seen with son at bedside. He exhibits features of deliruim including difficulty following commands, decreased attention, irrelevant utterances. His dentition is intact and no oromotor weakness. Initially difficulty processing use of straw but after spoon sips thin he was able to initiate timely swallows with thin liquids without concern for aspiration. Applesauce manipulated and transited without difficulty and pt was able to masticate several trials of graham cracker timely without residue. RN present and assisted with pills, whole in applesauce (unable to crush) which he was able to transit x 1. Subsequent trial pill remained on tongue but was able to transit with sips water. Pt's performance may wax and wane depending  on level of delirium at that time. Diet texture does not need to be modified at this time as he was able to demonstrate mastication and swallow initiation. Recommend continue soft texture (suspect due to bowel perforation), thin liquids, crush pills if able and if not use applesauce and/or water to transit. If pt has periods where he is holding food or delirium prevents intake then defer po's at  that time and try again when improved. No further ST needed at present. MD can upgrade to regular when deemed appropriate.  SLP Visit Diagnosis: Dysphagia, unspecified (R13.10)    Aspiration Risk  Mild aspiration risk    Diet Recommendation Thin liquid;Other (Comment) (soft)   Liquid Administration via: Straw Medication Administration:  (crush or whole in puree if cannot crush) Supervision: Full supervision/cueing for compensatory strategies;Staff to assist with self feeding Compensations: Slow rate;Small sips/bites Postural Changes: Seated upright at 90 degrees    Other  Recommendations Oral Care Recommendations: Oral care BID    Recommendations for follow up therapy are one component of a multi-disciplinary discharge planning process, led by the attending physician.  Recommendations may be updated based on patient status, additional functional criteria and insurance authorization.  Follow up Recommendations No SLP follow up      Assistance Recommended at Discharge    Functional Status Assessment Patient has not had a recent decline in their functional status  Frequency and Duration            Prognosis        Swallow Study   General Date of Onset: 12/14/22 HPI: 72 year old gentleman with history of CLL, paroxysmal A-fib on Eliquis, history of colon cancer and left hemicolectomy more than 10 years ago, GERD who was suffering from enteritis and recently on oral antibiotics came to the emergency room with worsening abdominal pain and confusion associated with watery stools.  Workup revealed perforated diverticulitis of the small bowel in the right upper quadrant. Patient underwent surgical exploration on 5/15.  Postoperative recovery complicated with delirium, A-fib and prolonged ileus. Pt on soft diet and ST ordered possibly due to delirium. Type of Study: Bedside Swallow Evaluation Previous Swallow Assessment:  (none) Diet Prior to this Study: Thin liquids (Level 0);Other  (Comment) (soft) Temperature Spikes Noted: No Respiratory Status: Room air History of Recent Intubation: No Behavior/Cognition: Confused;Other (Comment);Alert;Cooperative;Pleasant mood (deatures of delirium) Oral Cavity Assessment: Within Functional Limits Oral Care Completed by SLP: No Oral Cavity - Dentition: Adequate natural dentition Vision: Functional for self-feeding Self-Feeding Abilities: Needs assist Patient Positioning: Upright in bed Baseline Vocal Quality: Normal Volitional Cough: Cognitively unable to elicit Volitional Swallow: Unable to elicit    Oral/Motor/Sensory Function Overall Oral Motor/Sensory Function:  (difficulty following commands. No focal weakness)   Ice Chips Ice chips: Not tested   Thin Liquid Thin Liquid: Within functional limits Presentation: Straw    Nectar Thick Nectar Thick Liquid: Not tested   Honey Thick Honey Thick Liquid: Not tested   Puree Puree: Within functional limits   Solid     Solid: Within functional limits      Roque Cash, Breck Coons 12/29/2022,2:32 PM

## 2022-12-29 NOTE — Progress Notes (Signed)
PROGRESS NOTE    Jonathon Parker [facility]  AVW:[id number] DOB: [birth year] DOA: 12/14/2022 PCP: Paulina Fusi, MD    Brief Narrative:  72 year old gentleman with history of CLL, paroxysmal A-fib on Eliquis, history of colon cancer and left hemicolectomy more than 10 years ago, GERD who was suffering from enteritis and recently on oral antibiotics came to the emergency room with worsening abdominal pain and confusion associated with watery stools.  Workup in the emergency room revealed perforated diverticulitis of the small bowel in the right upper quadrant.  Started on IV antibiotics and resuscitation.  Patient underwent surgical exploration on 5/15.  Postoperative recovery complicated with delirium, A-fib and prolonged ileus. 5/22, with florid delirium features. 5/23, some improvement of symptoms.  Using haloperidol.  Recurrent nausea. 5/26, back to being delirious, occasional nausea.  Restless night.   Assessment & Plan:   Perforated diverticulitis of the small bowel: Exploratory laparotomy lysis of adhesion and small bowel resection with primary anastomosis 5/15 Prolonged ileus.  Now with return of bowel function, however poor tolerance of diet. Remains on TPN due to poor oral intake, pharmacy managing. Continue mobility. Completed antibiotics after the procedure. Hopefully he can tolerate diet and can come off TPN.  Diet as tolerated, however due to his delirium difficult to tolerate diet.  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 Was on heparin perioperative.  No evidence of ongoing bleeding.   Now on Eliquis.  Rate controlled on metoprolol. Echocardiogram with ejection fraction 55 to 60%.  Acute blood loss anemia on chronic macrocytic anemia: Baseline hemoglobin about 12. Postop hemoglobin 7.6-7.2-1 unit PRBC 8-1 additional unit of PRBC-hemoglobin more than 10.  Monitor closely. Hemoglobin has been stable since  then.  Hospital-acquired delirium: Patient with underlying history of dementia.  He has developed significant tremulousness and confusion. Florid delirium 5/22 responded to haloperidol. CT head was essentially normal.  Ammonia was normal. Using haloperidol by mouth and occasionally by injection. Avoid benzodiazepines or narcotics. High-dose Tylenol for pain. 5/26, overnight difficult to control delirium and Seroquel not helpful. Haloperidol 5 mg p.o. twice daily, haloperidol 2 mg IV as needed.  If responds well to haloperidol in the daytime, will discharge patient on haloperidol 10 mg at night. Continue trazodone. Continue trazodone 100 mg at night. Will check TSH, B12, repeat ammonia levels today.  Depression, dementia without behavioral disturbances: On Wellbutrin, Aricept .  As above.  Obstructive sleep apnea: Use CPAP as tolerated.  Unable to use.  Goal of care: Prolonged delirium is poor prognostic factor.  Palliative care following.  Currently remains full code and hopeful to have rehab.   DVT prophylaxis: Place and maintain sequential compression device Start: 12/16/22 1321 apixaban (ELIQUIS) tablet 5 mg   Code Status: Full code Family Communication: Wife at the bedside. Disposition Plan: Status is: Inpatient Remains inpatient appropriate because: Significant delirium, TPN, intolerance to diet.     Consultants:  Cardiology Surgery Palliative care,   Procedures:  Ex lap 5/50  Antimicrobials:  Completed Zosyn   Subjective:  Seen and examined.  He is back to being delirious, restless, reaching out objects.  He did eat some soft food yesterday when he was alert but he is throwing out most of the things and medications when he is confused.  Wife at the bedside.   Objective: Vitals:   12/29/22 0430 12/29/22 0541 12/29/22 0754 12/29/22 0801  BP: 130/73  118/65   Pulse: 88  100 100  Resp: 20  (!) 23  Temp:  98.5 F (36.9 C) 98.3 F (36.8 C)   TempSrc:  Oral  Oral   SpO2: 97%  97%   Weight:  89.5 kg    Height:        Intake/Output Summary (Last 24 hours) at 12/29/2022 1123 Last data filed at 12/29/2022 0825 Gross per 24 hour  Intake 60 ml  Output 1750 ml  Net -1690 ml    Filed Weights   12/27/22 0335 12/28/22 0350 12/29/22 0541  Weight: 88.5 kg 88.2 kg 89.5 kg    Examination:  General: Frail and debilitated.  Patient is alert to stimulation, eyes rolled, blinking frequently, not following commands, incomprehensible words, unintentional movements. Does not have any focal neurological deficits.  Moves all extremities equally. Cardiovascular: S1-S2 normal.  Irregularly irregular.  Respiratory: Bilateral clear.  No added sounds. Gastrointestinal: Soft.  Midline incision with intact staples.  Nontender.  No rigidity or guarding. Ext: No edema.     Data Reviewed: I have personally reviewed following labs and imaging studies  CBC: Recent Labs  Lab 12/23/22 0507 12/23/22 1229 12/24/22 0525 12/25/22 0418 12/25/22 2050 12/26/22 0550 12/27/22 1103  WBC 4.9  --  6.9 6.4  --  7.5 7.8  HGB 7.6*   < > 7.2* 8.5* 8.0* 8.6* 10.7*  HCT 23.9*  --  22.4* 25.8* 25.2* 25.6* 32.6*  MCV 103.5*  --  103.2* 98.5  --  98.5 96.7  PLT 191  --  217 186  --  176 169   < > = values in this interval not displayed.    Basic Metabolic Panel: Recent Labs  Lab 12/23/22 0507 12/25/22 0947 12/26/22 0550 12/28/22 0041  NA 135 139 135 134*  K 3.9 3.9 4.2 4.4  CL 101 106 106 106  CO2 27 23 22  21*  GLUCOSE 176* 124* 110* 140*  BUN 23 25* 22 22  CREATININE 0.73 0.74 0.78 0.83  CALCIUM 8.6* 8.7* 8.8* 8.6*  MG 2.0  --  2.1  --   PHOS 2.8  --  3.9 4.0    GFR: Estimated Creatinine Clearance: 89.7 mL/min (by C-G formula based on SCr of 0.83 mg/dL). Liver Function Tests: Recent Labs  Lab 12/23/22 0507 12/26/22 0550  AST 35 27  ALT 42 45*  ALKPHOS 163* 192*  BILITOT 0.8 0.8  PROT 5.7* 5.8*  ALBUMIN 2.4* 3.0*    No results for input(s):  "LIPASE", "AMYLASE" in the last 168 hours. Recent Labs  Lab 12/25/22 0946  AMMONIA 25    Coagulation Profile: No results for input(s): "INR", "PROTIME" in the last 168 hours. Cardiac Enzymes: No results for input(s): "CKTOTAL", "CKMB", "CKMBINDEX", "TROPONINI" in the last 168 hours. BNP (last 3 results) No results for input(s): "PROBNP" in the last 8760 hours. HbA1C: No results for input(s): "HGBA1C" in the last 72 hours. CBG: Recent Labs  Lab 12/25/22 0533 12/25/22 1122 12/25/22 1542 12/25/22 2309 12/26/22 0600  GLUCAP 132* 129* 128* 150* 108*    Lipid Profile: No results for input(s): "CHOL", "HDL", "LDLCALC", "TRIG", "CHOLHDL", "LDLDIRECT" in the last 72 hours.  Thyroid Function Tests: No results for input(s): "TSH", "T4TOTAL", "FREET4", "T3FREE", "THYROIDAB" in the last 72 hours. Anemia Panel: No results for input(s): "VITAMINB12", "FOLATE", "FERRITIN", "TIBC", "IRON", "RETICCTPCT" in the last 72 hours. Sepsis Labs: No results for input(s): "PROCALCITON", "LATICACIDVEN" in the last 168 hours.   No results found for this or any previous visit (from the past 240 hour(s)).        Radiology  Studies: No results found.      Scheduled Meds:  alum & mag hydroxide-simeth  30 mL Oral Once   apixaban  5 mg Oral BID   atorvastatin  20 mg Oral Daily   buPROPion  300 mg Oral Daily   Chlorhexidine Gluconate Cloth  6 each Topical Daily   colestipol  1 g Oral Daily   donepezil  5 mg Oral Daily   feeding supplement  237 mL Oral TID BM   Gerhardt's butt cream   Topical QID   haloperidol  5 mg Oral BID   metoprolol tartrate  75 mg Oral BID   pantoprazole (PROTONIX) IV  40 mg Intravenous Q2200   scopolamine  1 patch Transdermal Q72H   sodium chloride flush  10-40 mL Intracatheter Q12H   traZODone  100 mg Oral QHS   Continuous Infusions:  sodium chloride Stopped (12/26/22 1410)   methocarbamol (ROBAXIN) IV 1,000 mg (12/29/22 0626)   promethazine (PHENERGAN)  injection (IM or IVPB) 12.5 mg (12/27/22 2215)   TPN ADULT (ION) 100 mL/hr at 12/28/22 1804   TPN ADULT (ION)       LOS: 14 days    Time spent: 40 minutes     Dorcas Carrow, MD [facility] Pager [phone]

## 2022-12-29 NOTE — Progress Notes (Signed)
PHARMACY - TOTAL PARENTERAL NUTRITION CONSULT NOTE  Indication: Small bowel diverticula perforation   Patient Measurements: Height: 6' 0.5" (184.2 cm) Weight: 89.5 kg (197 lb 5 oz) IBW/kg (Calculated) : 78.75 TPN AdjBW (KG): 95.3 Body mass index is 26.39 kg/m.  Assessment:  84 YOM presented to [facility] on 5/11 with fevers, abdominal pain and confusion.  PMH significant for CLL, and colon cancer post L hemicolectomy.  Recent history of pacemaker placement for Afib/brady and enteritis.  CT showed small bowel diverticulitis vs enteritis vs SBO with ischemia.  Patient was on a CLD on 5/13 and began having increased abdominal pain.  Surgery plans to perform an ex-lap with SBR.  Patient reports eating two meals a day and intake was reduced to ~50% given his abdominal pain that started months ago.  Patient feels that he lost weight but is unsure of how much.  Glucose / Insulin: no hx DM - CBGs < 180.  SSI D/C'ed Electrolytes: low Na/CO2, others WNL on 5/25 Renal: SCr < 1, BUN WNL Hepatic: AST WNL, AST mildly elevated at 45, alk phos up to 192, tbili normalized, albumin 3 Intake / Output; MIVF: UOP 1.2 ml/kg/hr, vomited x1 5/24, LBM 5/24 GI Imaging:  5/19 portable X-ray shows nonobstructive bowel gas pattern 5/24 abx XR: cannot exclude ileus, colonic distension has decreased GI Surgeries / Procedures:  5/15 ex-lap with LoA, SBR with primary anastomosis  Central access: PICC placed 12/17/22 TPN start date: 12/18/22  Nutritional Goals: RD Estimated Needs Total Energy Estimated Needs: 2400-2600 Total Protein Estimated Needs: 120-135g Total Fluid Estimated Needs: >/=2L  Current Nutrition:  TPN Soft diet - consume 10% of meals (last Phenergan use 5/24).  Wife reports barriers to eating more are fear of vomiting and his throat is sore from vomiting previously.  Patient doesn't eat much at baseline.  Plan:  Continue TPN at goal rate of 100 mL/hr to provide 125g AA and 2426 kCal, meeting  100% of needs Electrolytes in TPN: Na 126mEq/L, reduce K to 20mEq/L on 5/25, Ca 5 mEq/L, Mg 38mEq/L, reduce Phos further to 38mmol/L and change Cl:Ac to 1:2 on 5/25 Add standard MVI and trace elements to TPN Standard TPN labs on Mon and Thurs  F/U PO intake to begin weaning TPN  Fredy Gladu D. Laney Potash, PharmD, BCPS, BCCCP 12/29/2022, 8:22 AM

## 2022-12-29 NOTE — Progress Notes (Signed)
Daily Progress Note   Patient Name: Jonathon Parker [facility]       Date: 12/29/2022 DOB: Jan 01, 1951  Age: 72 y.o. MRN#: [id number] Attending Physician: Dorcas Carrow, MD Primary Care Physician: Paulina Fusi, MD Admit Date: 12/14/2022  Reason for Consultation/Follow-up: Establishing goals of care  Subjective: Patient was sitting upright in bed. He appears agitated reaching for things that are not there. His wife is at bedside trying to reorient him.  Length of Stay: 14  Current Medications: Scheduled Meds:   alum & mag hydroxide-simeth  30 mL Oral Once   apixaban  5 mg Oral BID   atorvastatin  20 mg Oral Daily   buPROPion  300 mg Oral Daily   Chlorhexidine Gluconate Cloth  6 each Topical Daily   colestipol  1 g Oral Daily   donepezil  5 mg Oral Daily   feeding supplement  237 mL Oral TID BM   Gerhardt's butt cream   Topical QID   haloperidol  5 mg Oral BID   metoprolol tartrate  75 mg Oral BID   pantoprazole (PROTONIX) IV  40 mg Intravenous Q2200   scopolamine  1 patch Transdermal Q72H   sodium chloride flush  10-40 mL Intracatheter Q12H   traZODone  100 mg Oral QHS    Continuous Infusions:  sodium chloride Stopped (12/26/22 1410)   acetaminophen 1,000 mg (12/29/22 1328)   methocarbamol (ROBAXIN) IV 1,000 mg (12/29/22 1414)   promethazine (PHENERGAN) injection (IM or IVPB) 12.5 mg (12/27/22 2215)   TPN ADULT (ION) 100 mL/hr at 12/28/22 1804   TPN ADULT (ION)      PRN Meds: sodium chloride, albuterol, haloperidol lactate, metoprolol tartrate, ondansetron **OR** ondansetron (ZOFRAN) IV, promethazine (PHENERGAN) injection (IM or IVPB), sodium chloride flush, traMADol  Physical Exam Vitals reviewed.  Constitutional:      Appearance: He is ill-appearing.  Pulmonary:      Effort: Pulmonary effort is normal.  Neurological:     Mental Status: He is disoriented.             Vital Signs: BP 110/70 (BP Location: Left Arm)   Pulse (!) 112   Temp 98.3 F (36.8 C) (Oral)   Resp 20   Ht 6' 0.5" (1.842 m)   Wt 89.5 kg   SpO2 94%   BMI 26.39 kg/m  SpO2: SpO2: 94 % O2 Device: O2 Device: Room Air O2 Flow Rate: O2 Flow Rate (L/min): 2 L/min  Intake/output summary:  Intake/Output Summary (Last 24 hours) at 12/29/2022 1454 Last data filed at 12/29/2022 1256 Gross per 24 hour  Intake 60 ml  Output 2300 ml  Net -2240 ml   LBM: Last BM Date : 12/27/22 Baseline Weight: Weight: 95.3 kg Most recent weight: Weight: 89.5 kg       Palliative Assessment/Data: 30%      Patient Active Problem List   Diagnosis Date Noted   Malnutrition of moderate degree 12/18/2022   Enteritis 12/15/2022   Acute ischemic VBA thalamic stroke, right (HCC) 11/24/2020   Mild intermittent asthma 01/28/2020   Dyspnea 05/11/2019   Thrombocytosis 05/11/2019   Alcohol dependence (HCC) 05/11/2019   Abnormal findings on diagnostic imaging of gall bladder 05/10/2019   Essential hypertension 04/22/2019   Post-operative complication 04/21/2019   Acute respiratory failure with hypoxia (HCC) 04/21/2019   SIRS (systemic inflammatory response syndrome) (HCC) 04/21/2019   Status post laparoscopic cholecystectomy 04/21/2019   HCAP (healthcare-associated pneumonia) 04/21/2019   CLL (chronic lymphocytic leukemia) (HCC) 04/21/2019   Calculous cholecystitis 02/28/2019   Campylobacter diarrhea    Diarrhea of infectious origin    STEC (Shiga toxin-producing Escherichia coli)    Elevated LFTs    Hypokalemia    Gastroenteritis 01/19/2017   Sepsis (HCC) 01/19/2017   GERD (gastroesophageal reflux disease) 01/19/2017   Depression 01/19/2017    Palliative Care Assessment & Plan   Patient Profile: 72 y.o. male  with past medical history of CLL, paroxysmal A-fib on Eliquis, history of colon  cancer and left hemicolectomy more than 10 years ago, GERD who was suffering from enteritis and recently on oral antibiotics came to the emergency room with worsening abdominal pain and confusion associated with watery stools.  Workup in the emergency room revealed perforated diverticulitis of the small bowel in the right upper quadrant.  He was admitted on 12/14/2022.   Started on IV antibiotics and resuscitation.  Patient underwent surgical exploration on 5/15.  Postoperative recovery complicated with delirium, A-fib and prolonged ileus. 5/22, with florid delirium features.   Assessment: Patient's wife Turkey states the weekend did not go well. The patients agitation and delirium returned. Benetta Spar is concerned with his increased nursing needs around the delirium. She is concerned about needs at discharge.   He has not been able to eat or swallow his pills this morning. She understands he cannot eat safely when he is delirius.I reassure her that he is getting nutritional needs through his TPN.   We discuss taking things one day at a time right now. We discussed potential best and worst case scenarios with Mike's discharge. I encouraged her to continue discussions around GOC with their children.  Recommendations/Plan: Full Code Full scope Continued discussion around Code Status and GOC Continued PMT support  Goals of Care and Additional Recommendations: Limitations on Scope of Treatment: Full Scope Treatment  Code Status:    Code Status Orders  (From admission, onward)           Start     Ordered   12/15/22 0849  Full code  Continuous       Question:  By:  Answer:  Consent: discussion documented in EHR   12/15/22 0850           Code Status History     Date Active Date Inactive Code Status Order ID Comments User Context   11/24/2020 1326 11/26/2020  2131 Full Code [id number]  Kara Mead, NP ED   05/11/2019 0212 05/13/2019 2109 Full Code [id number]  John Giovanni, MD  Inpatient   04/21/2019 2307 04/23/2019 1903 Full Code [id number]  Hillary Bow, DO ED   02/28/2019 0727 03/07/2019 2004 Full Code [id number]  [name], MD ED   01/19/2017 1839 01/23/2017 1711 Full Code [id number]  Ghimire, Werner Lean, MD Inpatient       Prognosis:  Unable to determine  Discharge Planning: To Be Determined   Thank you for allowing the Palliative Medicine Team to assist in the care of this patient.  Time spent 40 minutes   Detailed review of medical records ( labs, imaging, vital signs), medically appropriate exam, counseling and education to patient's wife, documenting clinical information, medication management, coordination of care.    Sherryll Burger, NP  Please contact Palliative Medicine Team phone at [phone] for questions and concerns.

## 2022-12-29 NOTE — Care Plan (Signed)
PT is unable to currently tolerate PO meds. Pt ended up throwing up the major of 9AM meds this morning due to intolerance. MD Ghimire made aware.

## 2022-12-30 DIAGNOSIS — R410 Disorientation, unspecified: Secondary | ICD-10-CM | POA: Diagnosis not present

## 2022-12-30 DIAGNOSIS — Z515 Encounter for palliative care: Secondary | ICD-10-CM | POA: Diagnosis not present

## 2022-12-30 DIAGNOSIS — K529 Noninfective gastroenteritis and colitis, unspecified: Secondary | ICD-10-CM | POA: Diagnosis not present

## 2022-12-30 DIAGNOSIS — E440 Moderate protein-calorie malnutrition: Secondary | ICD-10-CM | POA: Diagnosis not present

## 2022-12-30 LAB — COMPREHENSIVE METABOLIC PANEL
ALT: 48 U/L — ABNORMAL HIGH (ref 0–44)
AST: 28 U/L (ref 15–41)
Albumin: 2.7 g/dL — ABNORMAL LOW (ref 3.5–5.0)
Alkaline Phosphatase: 276 U/L — ABNORMAL HIGH (ref 38–126)
Anion gap: 6 (ref 5–15)
BUN: 23 mg/dL (ref 8–23)
CO2: 26 mmol/L (ref 22–32)
Calcium: 8.2 mg/dL — ABNORMAL LOW (ref 8.9–10.3)
Chloride: 101 mmol/L (ref 98–111)
Creatinine, Ser: 0.85 mg/dL (ref 0.61–1.24)
GFR, Estimated: >60 mL/min (ref >60)
Glucose, Bld: 128 mg/dL — ABNORMAL HIGH (ref 70–99)
Potassium: 4.2 mmol/L (ref 3.5–5.1)
Sodium: 133 mmol/L — ABNORMAL LOW (ref 135–145)
Total Bilirubin: 0.7 mg/dL (ref 0.3–1.2)
Total Protein: 5.4 g/dL — ABNORMAL LOW (ref 6.5–8.1)

## 2022-12-30 LAB — CBC
HCT: 29.8 % — ABNORMAL LOW (ref 39.0–52.0)
Hemoglobin: 9.7 g/dL — ABNORMAL LOW (ref 13.0–17.0)
MCH: 32.7 pg (ref 26.0–34.0)
MCHC: 32.6 g/dL (ref 30.0–36.0)
MCV: 100.3 fL — ABNORMAL HIGH (ref 80.0–100.0)
Platelets: 151 K/uL (ref 150–400)
RBC: 2.97 MIL/uL — ABNORMAL LOW (ref 4.22–5.81)
RDW: 15.9 % — ABNORMAL HIGH (ref 11.5–15.5)
WBC: 5.8 K/uL (ref 4.0–10.5)
nRBC: 0 % (ref 0.0–0.2)

## 2022-12-30 LAB — PHOSPHORUS: Phosphorus: 2.8 mg/dL (ref 2.5–4.6)

## 2022-12-30 LAB — MAGNESIUM: Magnesium: 2.2 mg/dL (ref 1.7–2.4)

## 2022-12-30 LAB — TRIGLYCERIDES: Triglycerides: 123 mg/dL (ref <150)

## 2022-12-30 MED ORDER — ACETAMINOPHEN 500 MG PO TABS
1000.0000 mg | ORAL_TABLET | Freq: Four times a day (QID) | ORAL | Status: AC
  Administered 2022-12-30 – 2022-12-31 (×2): 1000 mg via ORAL
  Filled 2022-12-30 (×2): qty 2

## 2022-12-30 MED ORDER — HALOPERIDOL 5 MG PO TABS
5.0000 mg | ORAL_TABLET | Freq: Every day | ORAL | Status: DC
  Administered 2022-12-31 – 2023-01-02 (×3): 5 mg via ORAL
  Filled 2022-12-30 (×5): qty 1

## 2022-12-30 MED ORDER — HALOPERIDOL 5 MG PO TABS
7.5000 mg | ORAL_TABLET | Freq: Every day | ORAL | Status: DC
  Administered 2022-12-30 – 2023-01-02 (×4): 7.5 mg via ORAL
  Filled 2022-12-30 (×4): qty 1

## 2022-12-30 MED ORDER — TRAVASOL 10 % IV SOLN
INTRAVENOUS | Status: AC
  Filled 2022-12-30: qty 1248

## 2022-12-30 NOTE — Progress Notes (Signed)
Physical Therapy Treatment Patient Details Name: Jonathon Parker [facility] MRN: [id number] DOB: 04-11-1951 Today's Date: 12/30/2022   History of Present Illness 72 y.o. male presented to the ED 12/14/22 with with worsening abdominal pain and confusion. Pt found to have perforated bowel and underwent bowel resection on 5/15. Post op complications include delirium, afib, and prolonged ileus. PMH - HTN, R thalamic CVA, colon CA, CLL, GERD, Depression, enteritis, sepsis, mild cognitive impairment, pacer.    PT Comments    Pt tolerated treatment well today. Pt was able to transfer from bed to chair with +2 Min A. Further ambulation deferred due to HR spiking to 154 during transfer. RN made aware. No change in DC/DME recs at this time. PT will continue to follow.   Recommendations for follow up therapy are one component of a multi-disciplinary discharge planning process, led by the attending physician.  Recommendations may be updated based on patient status, additional functional criteria and insurance authorization.  Follow Up Recommendations  Can patient physically be transported by private vehicle: No    Assistance Recommended at Discharge Frequent or constant Supervision/Assistance  Patient can return home with the following Two people to help with walking and/or transfers;Two people to help with bathing/dressing/bathroom;Direct supervision/assist for financial management;Direct supervision/assist for medications management;Assistance with cooking/housework;Help with stairs or ramp for entrance   Equipment Recommendations  Other (comment) (Per accepting facility)    Recommendations for Other Services       Precautions / Restrictions Precautions Precautions: Fall Restrictions Weight Bearing Restrictions: No     Mobility  Bed Mobility Overal bed mobility: Needs Assistance Bed Mobility: Supine to Sit     Supine to sit: Supervision     General bed mobility comments: Pt able to come to  EOB on his own however required increased time.    Transfers Overall transfer level: Needs assistance Equipment used: Rolling walker (2 wheels) Transfers: Sit to/from Stand, Bed to chair/wheelchair/BSC Sit to Stand: +2 physical assistance, Min assist   Step pivot transfers: +2 physical assistance, Min assist       General transfer comment: Attempted second STS however HR went to 154 and pt was returned to chair. Cues for hand placement.    Ambulation/Gait               General Gait Details: HR up to 154 during transfer so ambulation deferred   Stairs             Wheelchair Mobility    Modified Rankin (Stroke Patients Only)       Balance Overall balance assessment: Needs assistance Sitting-balance support: Bilateral upper extremity supported, Feet supported Sitting balance-Leahy Scale: Fair Sitting balance - Comments: Able to sit EOB without trunk lean   Standing balance support: Bilateral upper extremity supported Standing balance-Leahy Scale: Poor Standing balance comment: walker and min assist for static standing                            Cognition Arousal/Alertness: Awake/alert Behavior During Therapy: WFL for tasks assessed/performed Overall Cognitive Status: Within Functional Limits for tasks assessed                                 General Comments: Pt was A&Ox4 today. Pt was able to crack jokes and follow comands consistently.        Exercises      General Comments General comments (skin  integrity, edema, etc.): BP: 109/72 supine, BP: 101/80 seated, BP: 113/93 standing, HR up to 154.      Pertinent Vitals/Pain Pain Assessment Pain Assessment: No/denies pain    Home Living                          Prior Function            PT Goals (current goals can now be found in the care plan section) Progress towards PT goals: Progressing toward goals    Frequency    Min 1X/week      PT Plan  Current plan remains appropriate    Co-evaluation              AM-PAC PT "6 Clicks" Mobility   Outcome Measure  Help needed turning from your back to your side while in a flat bed without using bedrails?: A Lot Help needed moving from lying on your back to sitting on the side of a flat bed without using bedrails?: A Lot Help needed moving to and from a bed to a chair (including a wheelchair)?: Total Help needed standing up from a chair using your arms (e.g., wheelchair or bedside chair)?: Total Help needed to walk in hospital room?: Total Help needed climbing 3-5 steps with a railing? : Total 6 Click Score: 8    End of Session Equipment Utilized During Treatment: Gait belt Activity Tolerance: Patient tolerated treatment well Patient left: in chair;with call bell/phone within reach;with chair alarm set;with family/visitor present Nurse Communication: Mobility status;Need for lift equipment PT Visit Diagnosis: Other abnormalities of gait and mobility (R26.89);Muscle weakness (generalized) (M62.81)     Time: 8657-8469 PT Time Calculation (min) (ACUTE ONLY): 20 min  Charges:  $Therapeutic Activity: 8-22 mins                     Shela Nevin, PT, DPT Acute Rehab Services [phone]    Jonathon Parker 12/30/2022, 4:40 PM

## 2022-12-30 NOTE — Progress Notes (Addendum)
12 Days Post-Op   Subjective/Chief Complaint: Resting comfortably right now   Objective: Vital signs in last 24 hours: Temp:  [97.3 F (36.3 C)-98.4 F (36.9 C)] 98.3 F (36.8 C) (05/27 0814) Pulse Rate:  [87-112] 87 (05/27 0325) Resp:  [20-26] 20 (05/27 0804) BP: (109-130)/(70-82) 125/81 (05/27 0800) SpO2:  [94 %-98 %] 98 % (05/27 0804) Weight:  [88.9 kg] 88.9 kg (05/27 0325) Last BM Date : 12/27/22  Intake/Output from previous day: 05/26 0701 - 05/27 0700 In: 60 [P.O.:60] Out: 1750 [Urine:1750] Intake/Output this shift: No intake/output data recorded.  General appearance: alert and cooperative Resp: clear to auscultation bilaterally Cardio: irregularly irregular rhythm GI: soft, nontender  Lab Results:  Recent Labs    12/27/22 1103 12/30/22 0810  WBC 7.8 5.8  HGB 10.7* 9.7*  HCT 32.6* 29.8*  PLT 169 151   BMET Recent Labs    12/28/22 0041 12/30/22 0810  NA 134* 133*  K 4.4 4.2  CL 106 101  CO2 21* 26  GLUCOSE 140* 128*  BUN 22 23  CREATININE 0.83 0.85  CALCIUM 8.6* 8.2*   PT/INR No results for input(s): "LABPROT", "INR" in the last 72 hours. ABG No results for input(s): "PHART", "HCO3" in the last 72 hours.  Invalid input(s): "PCO2", "PO2"  Studies/Results: No results found.  Anti-infectives: Anti-infectives (From admission, onward)    Start     Dose/Rate Route Frequency Ordered Stop   12/19/22 2000  piperacillin-tazobactam (ZOSYN) IVPB 3.375 g        3.375 g 12.5 mL/hr over 240 Minutes Intravenous Every 8 hours 12/19/22 1550 12/23/22 2359   12/16/22 1100  piperacillin-tazobactam (ZOSYN) IVPB 3.375 g  Status:  Discontinued        3.375 g 12.5 mL/hr over 240 Minutes Intravenous Every 8 hours 12/16/22 0954 12/19/22 1550   12/16/22 0400  ampicillin-sulbactam (UNASYN) 1.5 g in sodium chloride 0.9 % 100 mL IVPB  Status:  Discontinued        1.5 g 200 mL/hr over 30 Minutes Intravenous Every 6 hours 12/16/22 0258 12/16/22 0855   12/15/22 1630   piperacillin-tazobactam (ZOSYN) IVPB 3.375 g  Status:  Discontinued        3.375 g 12.5 mL/hr over 240 Minutes Intravenous Every 8 hours 12/15/22 1528 12/16/22 0307   12/15/22 1000  azithromycin (ZITHROMAX) tablet 500 mg  Status:  Discontinued        500 mg Oral Daily 12/15/22 0900 12/16/22 0258   12/15/22 0045  piperacillin-tazobactam (ZOSYN) IVPB 3.375 g        3.375 g 12.5 mL/hr over 240 Minutes Intravenous  Once 12/15/22 0044 12/15/22 0446       Assessment/Plan: s/p Procedure(s): EXPLORATORY LAPAROTOMY, BOWEL RESECTION (N/A) Advance diet. Allow soft food Seems to be stable from surgical standpoint Hg stable Continue tpn Afib per cards POD 12 s/p Exploratory laparotomy, lysis of adhesions, small bowel resection with primary anastomosis for Contained perforation of jejunal diverticulum by Dr. Corliss Skains on 12/18/22 - Path benign - wean TPN as he tolerates more food. Aspiration risk but cleared by speech - IV Tylenol and Robaxin while NGT in place.  - Cont abx, plan 5d post op - Mobilize - PT/OT - Pulm toilet, IS, wean o2 as able -afib per Cardiology. Rate needs to be controlled before he will participate in therapy   FEN - NPO, PICC/TPN VTE - SCDs, heparin gtt.  ID - Zosyn Foley - Out POD 1, voiding.     CLL (last chemo in November  but on venetoclax)  Recent resp issues - bronchitis, pneumonia, RSV  A. Fib/Bradycardia - pacemaker placement about a month ago   LOS: 15 days    Jonathon Parker 12/30/2022

## 2022-12-30 NOTE — Plan of Care (Signed)
  Problem: Clinical Measurements: Goal: Will remain free from infection Outcome: Progressing Goal: Respiratory complications will improve Outcome: Progressing   Problem: Activity: Goal: Risk for activity intolerance will decrease Outcome: Progressing   Problem: Nutrition: Goal: Adequate nutrition will be maintained Outcome: Progressing   Problem: Elimination: Goal: Will not experience complications related to urinary retention Outcome: Progressing   Problem: Pain Managment: Goal: General experience of comfort will improve Outcome: Progressing   Problem: Safety: Goal: Ability to remain free from injury will improve Outcome: Progressing   Problem: Skin Integrity: Goal: Risk for impaired skin integrity will decrease Outcome: Progressing   Problem: Coping: Goal: Level of anxiety will decrease Outcome: Not Progressing Note: Frequent, intermittent agitation and anxiety requiring medication.

## 2022-12-30 NOTE — Progress Notes (Signed)
Daily Progress Note   Patient Name: Jonathon Parker [facility]       Date: 12/30/2022 DOB: 1950-12-28  Age: 72 y.o. MRN#: [id number] Attending Physician: Dorcas Carrow, MD Primary Care Physician: Paulina Fusi, MD Admit Date: 12/14/2022  Reason for Consultation/Follow-up: Establishing goals of care  Subjective: Patient laying down in bed. He appears less agitated than yesterday. His wife is at bedside.  Length of Stay: 15  Current Medications: Scheduled Meds:   alum & mag hydroxide-simeth  30 mL Oral Once   apixaban  5 mg Oral BID   atorvastatin  20 mg Oral Daily   buPROPion  300 mg Oral Daily   Chlorhexidine Gluconate Cloth  6 each Topical Daily   colestipol  1 g Oral Daily   donepezil  5 mg Oral Daily   feeding supplement  237 mL Oral TID BM   Gerhardt's butt cream   Topical QID   haloperidol  5 mg Oral Daily   haloperidol  7.5 mg Oral QHS   metoprolol tartrate  75 mg Oral BID   pantoprazole (PROTONIX) IV  40 mg Intravenous Q2200   scopolamine  1 patch Transdermal Q72H   sodium chloride flush  10-40 mL Intracatheter Q12H   traZODone  100 mg Oral QHS    Continuous Infusions:  sodium chloride Stopped (12/26/22 1410)   acetaminophen 1,000 mg (12/30/22 0835)   methocarbamol (ROBAXIN) IV 1,000 mg (12/30/22 0548)   promethazine (PHENERGAN) injection (IM or IVPB) 12.5 mg (12/27/22 2215)   TPN ADULT (ION) 100 mL/hr at 12/29/22 1747    PRN Meds: sodium chloride, albuterol, haloperidol lactate, metoprolol tartrate, ondansetron **OR** ondansetron (ZOFRAN) IV, phenol, promethazine (PHENERGAN) injection (IM or IVPB), sodium chloride flush, traMADol  Physical Exam Vitals reviewed.  Constitutional:      Appearance: He is ill-appearing.  Pulmonary:     Effort: Pulmonary effort is  normal.  Skin:    General: Skin is warm and dry.  Neurological:     Comments: delirium             Vital Signs: BP 125/81 (BP Location: Left Arm)   Pulse 87   Temp 98.3 F (36.8 C) (Axillary)   Resp 20   Ht 6' 0.5" (1.842 m)   Wt 88.9 kg   SpO2 98%   BMI 26.22 kg/m  SpO2: SpO2: 98 % O2 Device: O2 Device: Room Air O2 Flow Rate: O2 Flow Rate (L/min): 2 L/min  Intake/output summary:  Intake/Output Summary (Last 24 hours) at 12/30/2022 0934 Last data filed at 12/30/2022 0500 Gross per 24 hour  Intake --  Output 1750 ml  Net -1750 ml   LBM: Last BM Date : 12/27/22 Baseline Weight: Weight: 95.3 kg Most recent weight: Weight: 88.9 kg       Palliative Assessment/Data: 30%      Patient Active Problem List   Diagnosis Date Noted   Palliative care by specialist 12/29/2022   Malnutrition of moderate degree 12/18/2022   Enteritis 12/15/2022   Acute ischemic VBA thalamic stroke, right (HCC) 11/24/2020   Mild intermittent asthma 01/28/2020   Dyspnea 05/11/2019   Thrombocytosis 05/11/2019   Alcohol dependence (HCC) 05/11/2019   Abnormal findings on diagnostic imaging of gall bladder 05/10/2019   Essential hypertension 04/22/2019   Post-operative complication 04/21/2019   Acute respiratory failure with hypoxia (HCC) 04/21/2019   SIRS (systemic inflammatory response syndrome) (HCC) 04/21/2019   Status post laparoscopic cholecystectomy 04/21/2019   HCAP (healthcare-associated pneumonia) 04/21/2019   CLL (chronic lymphocytic leukemia) (HCC) 04/21/2019   Calculous cholecystitis 02/28/2019   Campylobacter diarrhea    Diarrhea of infectious origin    STEC (Shiga toxin-producing Escherichia coli)    Elevated LFTs    Hypokalemia    Gastroenteritis 01/19/2017   Sepsis (HCC) 01/19/2017   GERD (gastroesophageal reflux disease) 01/19/2017   Depression 01/19/2017    Palliative Care Assessment & Plan   Patient Profile: 72 y.o. male  with past medical history of CLL,  paroxysmal A-fib on Eliquis, history of colon cancer and left hemicolectomy more than 10 years ago, GERD who was suffering from enteritis and recently on oral antibiotics came to the emergency room with worsening abdominal pain and confusion associated with watery stools.  Workup in the emergency room revealed perforated diverticulitis of the small bowel in the right upper quadrant.  He was admitted on 12/14/2022.   Started on IV antibiotics and resuscitation.  Patient underwent surgical exploration on 5/15.  Postoperative recovery complicated with delirium, A-fib and prolonged ileus. 5/22, with florid delirium features.   Assessment: Patient's wife Turkey states last night went better. They both got some sleep. We discussed the patient's continued delirium. We discussed that patient's with extended delirium can have worse prognoses.  Benetta Spar has been thinking about our conversation last week. She notes the patient has had a gradual decline over time and especially the last 6 months or so. With regards to code status, she is thinking that attempted CPR would be worse for him than letting him go. She is still exploring this and would ideally like to talk with Kathlene November about it when he is oriented.   Recommendations/Plan: Full Code Full scope Continued discussion around Code Status and GOC Continued PMT support  Goals of Care and Additional Recommendations: Limitations on Scope of Treatment: Full Scope Treatment  Code Status:    Code Status Orders  (From admission, onward)           Start     Ordered   12/15/22 0849  Full code  Continuous       Question:  By:  Answer:  Consent: discussion documented in EHR   12/15/22 0850           Code Status History     Date Active Date Inactive Code Status Order ID Comments User Context   11/24/2020 1326 11/26/2020  2131 Full Code [id number]  Kara Mead, NP ED   05/11/2019 0212 05/13/2019 2109 Full Code [id number]  John Giovanni, MD  Inpatient   04/21/2019 2307 04/23/2019 1903 Full Code [id number]  Hillary Bow, DO ED   02/28/2019 0727 03/07/2019 2004 Full Code [id number]  [name], MD ED   01/19/2017 1839 01/23/2017 1711 Full Code [id number]  Ghimire, Werner Lean, MD Inpatient       Prognosis:  Unable to determine  Discharge Planning: To Be Determined  Care plan was discussed with Dr. Jerral Ralph    Thank you for allowing the Palliative Medicine Team to assist in the care of this patient.  Time spent 35 minutes   Detailed review of medical records ( labs, imaging, vital signs), medically appropriate exam, counseling and education to patient's wife, documenting clinical information, medication management, coordination of care.    Sherryll Burger, NP  Please contact Palliative Medicine Team phone at [phone] for questions and concerns.

## 2022-12-30 NOTE — TOC Progression Note (Signed)
Transition of Care [facility]) - Progression Note    Patient Details  Name: Jonathon Parker MRN: [id number] Date of Birth: 1951/07/07  Transition of [facility]) CM/SW Contact  Delilah Shan, LCSWA Phone Number: 12/30/2022, 4:38 PM  Clinical Narrative:     Patient has SNF bed at [facility] when medically ready. CSW will start insurance authorization closer to patient being medically ready. CSW will continue to follow and assist with patients dc planning needs.   Expected Discharge Plan: Skilled Nursing Facility Barriers to Discharge: Continued Medical Work up  Expected Discharge Plan and Services In-house Referral: Clinical Social Work     Living arrangements for the past 2 months: Single Family Home                                       Social Determinants of Health (SDOH) Interventions SDOH Screenings   Food Insecurity: No Food Insecurity (12/15/2022)  Housing: Low Risk  (12/15/2022)  Transportation Needs: No Transportation Needs (12/15/2022)  Utilities: Not At Risk (12/15/2022)  Tobacco Use: Low Risk  (12/19/2022)    Readmission Risk Interventions     No data to display

## 2022-12-30 NOTE — Progress Notes (Signed)
PROGRESS NOTE    Tarez Cardello [facility]  WUJ:[id number] DOB: [birth year] DOA: 12/14/2022 PCP: Paulina Fusi, MD    Brief Narrative:  72 year old gentleman with history of CLL, paroxysmal A-fib on Eliquis, history of colon cancer and left hemicolectomy more than 10 years ago, GERD who was suffering from enteritis and recently on oral antibiotics came to the emergency room with worsening abdominal pain and confusion associated with watery stools.  Workup in the emergency room revealed perforated diverticulitis of the small bowel in the right upper quadrant.  Started on IV antibiotics and resuscitation.  Patient underwent surgical exploration on 5/15.  Postoperative recovery complicated with delirium, A-fib and prolonged ileus. 5/22, with florid delirium features. 5/23, some improvement of symptoms.  Using haloperidol.  Recurrent nausea. 5/26, back to being delirious, occasional nausea.  Restless night.  Did not do well on the Seroquel. 5/27, multiple doses of haloperidol and responded well.   Assessment & Plan:   Perforated diverticulitis of the small bowel: Exploratory laparotomy, lysis of adhesion and small bowel resection with primary anastomosis 5/15 Prolonged ileus.  Now with return of bowel function, however poor tolerance of diet due to delirium. Remains on TPN due to poor oral intake, pharmacy managing. Continue mobility. Completed antibiotics after the procedure. Hopefully he can tolerate diet and can come off TPN.  Diet as tolerated, however due to his delirium difficult to tolerate diet.  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 Was on heparin perioperative.  No evidence of ongoing bleeding.   Now on Eliquis.  Rate controlled on metoprolol. Echocardiogram with ejection fraction 55 to 60%.  Acute blood loss anemia on chronic macrocytic anemia: Baseline hemoglobin about 12. Postop hemoglobin 7.6-7.2-1 unit PRBC 8-1 additional  unit of PRBC-hemoglobin more than 10.  Monitor closely. Hemoglobin has been stable since then.  Hospital-acquired delirium: Patient with underlying history of dementia.  He has developed significant tremulousness and confusion. Multiple episodes of intermittent delirium, difficult to control. Tried different medications and he responds well on Haldol only. 5/27 Haloperidol 5 mg in the morning Haloperidol 7.5 mg at night with aim to decrease use of IV haloperidol.  Continue IV as needed. Trazodone 100 mg at night that he takes at home. TSH, B12, ammonia levels were normal. Continue high-dose Tylenol for pain and avoid narcotics. With persistent symptoms, I had asked for psychiatry help to manage symptoms.  Depression, dementia without behavioral disturbances: On Wellbutrin, Aricept .  As above.  Obstructive sleep apnea: Use CPAP as tolerated.  Unable to use.  Goal of care: Prolonged delirium is poor prognostic factor.  Palliative care following.  Currently remains full code and hopeful to have rehab.   DVT prophylaxis: Place and maintain sequential compression device Start: 12/16/22 1321 apixaban (ELIQUIS) tablet 5 mg   Code Status: Full code Family Communication: Wife at the bedside. Disposition Plan: Status is: Inpatient Remains inpatient appropriate because: Significant delirium, TPN, intolerance to diet.     Consultants:  Cardiology Surgery Palliative care,   Procedures:  Ex lap 5/50  Antimicrobials:  Completed Zosyn   Subjective:  Patient seen and examined.  He did relatively better last night and slept most of the night.  Patient has some sore throat and difficult to understand but he is appropriately responding today and looks more comfortable.  Really not eating much.  Denies any pain.   Objective: Vitals:   12/30/22 0700 12/30/22 0800 12/30/22 0804 12/30/22 0814  BP:  125/81    Pulse:  Resp: (!) 21 20 20    Temp:    98.3 F (36.8 C)  TempSrc:     Axillary  SpO2:   98%   Weight:      Height:        Intake/Output Summary (Last 24 hours) at 12/30/2022 1005 Last data filed at 12/30/2022 0953 Gross per 24 hour  Intake --  Output 2525 ml  Net -2525 ml    Filed Weights   12/28/22 0350 12/29/22 0541 12/30/22 0325  Weight: 88.2 kg 89.5 kg 88.9 kg    Examination:  General: Frail and debilitated.  Today patient is alert and awake.  He is oriented to himself and answers basic questions.  He is still tremulous but looks more comfortable today.  Incomprehensible words because of sore throat. Does not have any focal neurological deficits.  Moves all extremities equally. Cardiovascular: S1-S2 normal.  Irregularly irregular.  Respiratory: Bilateral clear.  No added sounds. Gastrointestinal: Soft.  Midline incision with intact staples.  Nontender.  No rigidity or guarding. Ext: No edema.     Data Reviewed: I have personally reviewed following labs and imaging studies  CBC: Recent Labs  Lab 12/24/22 0525 12/25/22 0418 12/25/22 2050 12/26/22 0550 12/27/22 1103 12/30/22 0810  WBC 6.9 6.4  --  7.5 7.8 5.8  HGB 7.2* 8.5* 8.0* 8.6* 10.7* 9.7*  HCT 22.4* 25.8* 25.2* 25.6* 32.6* 29.8*  MCV 103.2* 98.5  --  98.5 96.7 100.3*  PLT 217 186  --  176 169 151    Basic Metabolic Panel: Recent Labs  Lab 12/25/22 0947 12/26/22 0550 12/28/22 0041 12/30/22 0810  NA 139 135 134* 133*  K 3.9 4.2 4.4 4.2  CL 106 106 106 101  CO2 23 22 21* 26  GLUCOSE 124* 110* 140* 128*  BUN 25* 22 22 23   CREATININE 0.74 0.78 0.83 0.85  CALCIUM 8.7* 8.8* 8.6* 8.2*  MG  --  2.1  --  2.2  PHOS  --  3.9 4.0 2.8    GFR: Estimated Creatinine Clearance: 87.6 mL/min (by C-G formula based on SCr of 0.85 mg/dL). Liver Function Tests: Recent Labs  Lab 12/26/22 0550 12/30/22 0810  AST 27 28  ALT 45* 48*  ALKPHOS 192* 276*  BILITOT 0.8 0.7  PROT 5.8* 5.4*  ALBUMIN 3.0* 2.7*    No results for input(s): "LIPASE", "AMYLASE" in the last 168  hours. Recent Labs  Lab 12/25/22 0946 12/29/22 2119  AMMONIA 25 35    Coagulation Profile: No results for input(s): "INR", "PROTIME" in the last 168 hours. Cardiac Enzymes: No results for input(s): "CKTOTAL", "CKMB", "CKMBINDEX", "TROPONINI" in the last 168 hours. BNP (last 3 results) No results for input(s): "PROBNP" in the last 8760 hours. HbA1C: No results for input(s): "HGBA1C" in the last 72 hours. CBG: Recent Labs  Lab 12/25/22 0533 12/25/22 1122 12/25/22 1542 12/25/22 2309 12/26/22 0600  GLUCAP 132* 129* 128* 150* 108*    Lipid Profile: Recent Labs    12/30/22 0810  TRIG 123    Thyroid Function Tests: Recent Labs    12/29/22 1300  TSH 1.159   Anemia Panel: Recent Labs    12/29/22 1300  VITAMINB12 566   Sepsis Labs: No results for input(s): "PROCALCITON", "LATICACIDVEN" in the last 168 hours.   No results found for this or any previous visit (from the past 240 hour(s)).        Radiology Studies: No results found.      Scheduled Meds:  alum & mag  hydroxide-simeth  30 mL Oral Once   apixaban  5 mg Oral BID   atorvastatin  20 mg Oral Daily   buPROPion  300 mg Oral Daily   Chlorhexidine Gluconate Cloth  6 each Topical Daily   colestipol  1 g Oral Daily   donepezil  5 mg Oral Daily   feeding supplement  237 mL Oral TID BM   Gerhardt's butt cream   Topical QID   haloperidol  5 mg Oral Daily   haloperidol  7.5 mg Oral QHS   metoprolol tartrate  75 mg Oral BID   pantoprazole (PROTONIX) IV  40 mg Intravenous Q2200   scopolamine  1 patch Transdermal Q72H   sodium chloride flush  10-40 mL Intracatheter Q12H   traZODone  100 mg Oral QHS   Continuous Infusions:  sodium chloride Stopped (12/26/22 1410)   acetaminophen 1,000 mg (12/30/22 0835)   methocarbamol (ROBAXIN) IV 1,000 mg (12/30/22 0548)   promethazine (PHENERGAN) injection (IM or IVPB) 12.5 mg (12/27/22 2215)   TPN ADULT (ION) 100 mL/hr at 12/29/22 1747   TPN ADULT (ION)        LOS: 15 days    Time spent: 40 minutes     Dorcas Carrow, MD [facility] Pager [phone]

## 2022-12-30 NOTE — Progress Notes (Signed)
PHARMACY - TOTAL PARENTERAL NUTRITION CONSULT NOTE  Indication: Small bowel diverticula perforation   Patient Measurements: Height: 6' 0.5" (184.2 cm) Weight: 88.9 kg (195 lb 15.8 oz) IBW/kg (Calculated) : 78.75 TPN AdjBW (KG): 95.3 Body mass index is 26.22 kg/m.  Assessment:  62 YOM presented to [facility] on 5/11 with fevers, abdominal pain and confusion.  PMH significant for CLL, and colon cancer post L hemicolectomy.  Recent history of pacemaker placement for Afib/brady and enteritis.  CT showed small bowel diverticulitis vs enteritis vs SBO with ischemia.  Patient was on a CLD on 5/13 and began having increased abdominal pain.  Surgery plans to perform an ex-lap with SBR.  Patient reports eating two meals a day and intake was reduced to ~50% given his abdominal pain that started months ago.  Patient feels that he lost weight but is unsure of how much.  Glucose / Insulin: no hx DM - CBGs < 180.  SSI D/C'ed Electrolytes: low Na/CO2 (max in TPN), others WNL Renal: SCr < 1, BUN WNL Hepatic: AST / TG WNL, ALT mildly elevated at 48, alk phos up to 276, tbili normalized, albumin 2.7, NH4 WNL Intake / Output; MIVF: UOP 0.8 ml/kg/hr, vomited x1 5/24, LBM 5/26 GI Imaging:  5/19 portable X-ray shows nonobstructive bowel gas pattern 5/24 abx XR: cannot exclude ileus, colonic distension has decreased GI Surgeries / Procedures:  5/15 ex-lap with LoA, SBR with primary anastomosis  Central access: PICC placed 12/17/22 TPN start date: 12/18/22  Nutritional Goals: RD Estimated Needs Total Energy Estimated Needs: 2400-2600 Total Protein Estimated Needs: 120-135g Total Fluid Estimated Needs: >/=2L  Current Nutrition:  TPN Ensure Enlive BID - 1 charted given yesterday Soft diet - consume 10% of meals (last Phenergan use 5/24).  Wife reports barriers to eating more are fear of vomiting and his throat is sore from vomiting previously.  Patient doesn't eat much at baseline.  Delirium also impacting  PO intake.  Plan:  Continue TPN at goal rate of 100 mL/hr to provide 125g AA and 2426 kCal, meeting 100% of needs Electrolytes in TPN: Na 153mEq/L, K 59mEq/L, Ca 5 mEq/L, Mg 54mEq/L, increase Phos back up to 9mmol/L, Cl:Ac to 1:2 for now Add standard MVI and trace elements to TPN Standard TPN labs on Mon and Thurs  F/U PO intake to begin weaning TPN  Maecy Podgurski D. Laney Potash, PharmD, BCPS, BCCCP 12/30/2022, 9:41 AM

## 2022-12-30 NOTE — Consult Note (Signed)
[facility] Psychiatry New Face-to-Face Psychiatric Evaluation   Service Date: Dec 30, 2022 LOS:  LOS: 15 days    Assessment  Jonathon Parker is a 72 y.o. male admitted medically for 12/14/2022  9:35 PM for perfed diverticulitis. He carries the psychiatric diagnoses of depression and has a past medical history listed below. Psychiatry was consulted for prolonged delirium refractory to initial measures by Dr. Jerral Parker.    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 Valentino Parker in [facility] hospital     Depression    GERD (gastroesophageal reflux disease)    controlled with nexium    Hypertension    Pacemaker     At this time, the patient's presentation is most consistent with  delirium, most likely due to multiple etiologies including but not limited to infection, medications, pain, altered sleep/wake cycle, and limited mobility. During this time period, minimization of delirogenic insults will be of utmost importance; this includes promoting the normal circadian cycle, minimizing lines/tubes, avoiding deliriogenic medications such as benzodiazepines and anticholinergic medications, and frequently reorienting the patient. Symptomatic treatment for agitation can be provided by antipsychotic medications, though it is important to remember that these do not treat the underlying etiology of delirium. Notably, there can be a time lag effect between treatment of a medical problem and resolution of delirium. This time lag effect may be of longer duration in the elderly, and those with underlying cognitive impairment or brain injury. On my assessment 5/27, pt was alert, oriented, with poor memory of most of hospitalization. I provided psycho education for the time course of delirium for pt and wife.    Diagnoses:  Active Hospital problems: Principal  Problem:   Enteritis Active Problems:   Sepsis (HCC)   Depression   CLL (chronic lymphocytic leukemia) (HCC)   Malnutrition of moderate degree   Palliative care by specialist     Plan  ## Safety and Observation Level:  - Based on my clinical evaluation, I estimate the patient to be at low risk of self harm in the current setting - At this time, we recommend a routine level of observation. This decision is based on my review of the chart including patient's history and current presentation, interview of the patient, mental status examination, and consideration of suicide risk including evaluating suicidal ideation, plan, intent, suicidal or self-harm behaviors, risk factors, and protective factors. This judgment is based on our ability to directly address suicide risk, implement suicide prevention strategies and develop a safety plan while the patient is in the clinical setting. Please contact our team if there is a concern that risk level has changed.   ## Medications:  -- c bupropion 300 mg PO qAM and trazodone 100 mg PO qPM -- would NOT start home zoloft at this point   -- c current haloperidol (5 mg qAM, 7.5 mg qPM) until pt has 2 consecutive good days, then can taper.   ## Medical Decision Making Capacity:  Not formally assessed  ## Further Work-up:  -- none currently   Appreciate thorough w/u by primary team   -- most recent EKG on 5/27 had QtC of 490 w/ HR 94, would correct to lower using fredericia  -- Pertinent labwork reviewed earlier this admission includes: TSH WNL, B12 wnl, low albumin (3.0  4d ago). Ammonia also normal. No elevated Cr.   CT head 5/22 NAICA but with atrophy  ## Disposition:  -- per primary   Thank you for this consult request. Recommendations have been communicated to the primary team.  We will continue to follow at this time.   Jonathon Parker A Jonathon Parker   New history  Relevant Aspects of Hospital Course:  Admitted on 12/14/2022 for what was  found to be perfed diverticulitis.Has been persistently delirious since surgery with partial response to haloperidol. Has failed to respond to quetiapine (seemed to make things much worse).   Patient Report:  Pt seen with wife. He is alert and oriented but uses cues in the room for month and date. Knows it is memorial day. Able do do DOWB with no issue, makes it about halfway through MOYB. Grossly attentive to interview, makes jokes, is quite pleasant.   No real memory of earlier part of hospitalization.   ROS:  No SI, HI, AH/VH currently   Collateral information:  Spoke to wife at bedside  Patient started experiencing memory difficulties about a year ago, has had slow steady decline since then.  She does not remember exactly when he was diagnosed with depression, but does recall that it was primarily due to irritability.  Psychiatric History:  Information collected from pt, wife On bupropion 300 at home Sertraline 25 started 3/4 (seems like new script, confirmed w/ wife) Trazodone 100 is home med  No hx suicide attempts or hospitalizations.   Social History:  Lives with wife Kids + 3 granddaughters Spiritual not religious Retired Runner, broadcasting/film/video  Family History:  The patient's family history includes Prostate cancer in his brother.  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 Valentino Parker in [facility] hospital     Depression    GERD (gastroesophageal reflux disease)    controlled with nexium    Hypertension    Pacemaker     Surgical History: Past Surgical History:  Procedure Laterality Date   APPENDECTOMY     CARPAL TUNNEL RELEASE     left    CHOLECYSTECTOMY N/A 04/20/2019   Procedure: LAPAROSCOPIC CHOLECYSTECTOMY;  Surgeon: Jonathon Adu, MD;  Location: [facility] ORS;  Service: General;  Laterality: N/A;   COLON SURGERY  2002   10  inches of colon taken out    COLONOSCOPY     ENDOSCOPIC RETROGRADE CHOLANGIOPANCREATOGRAPHY (ERCP) WITH PROPOFOL N/A 03/01/2019   Procedure: ENDOSCOPIC RETROGRADE CHOLANGIOPANCREATOGRAPHY (ERCP) WITH PROPOFOL;  Surgeon: [name]., MD;  Location: [facility] ENDOSCOPY;  Service: Gastroenterology;  Laterality: N/A;   ERCP  03/01/2019   HERNIA REPAIR     bilateral inguinal    IR CHOLANGIOGRAM EXISTING TUBE  03/25/2019   IR PERC CHOLECYSTOSTOMY  03/02/2019   LAPAROTOMY N/A 12/18/2022   Procedure: EXPLORATORY LAPAROTOMY, BOWEL RESECTION;  Surgeon: [name], MD;  Location: [facility] OR;  Service: General;  Laterality: N/A;   left heel reconstruction  01/2002   17 screws and 2 plates    PACEMAKER INSERTION     POLYPECTOMY     REMOVAL OF STONES  03/01/2019   Procedure: REMOVAL OF STONES;  Surgeon: [name]., MD;  Location: [facility] ENDOSCOPY;  Service: Gastroenterology;;   [name]  03/01/2019   Procedure: [name];  Surgeon: [name]., MD;  Location:  [facility] ENDOSCOPY;  Service: Gastroenterology;;   tooth extracted     with laughing gas    UPPER GASTROINTESTINAL ENDOSCOPY      Medications:   Current Facility-Administered Medications:    0.9 %  sodium chloride infusion, , Intravenous, PRN, Nelson Chimes, Loura Halt, MD, Stopped at 12/26/22 1410   acetaminophen (TYLENOL) tablet 1,000 mg, 1,000 mg, Oral, Q6H, Ghimire, Kuber, MD   albuterol (PROVENTIL) (2.5 MG/3ML) 0.083% nebulizer solution 2.5 mL, 2.5 mL, Inhalation, Q6H PRN, Kabrich, Martha H, PA-C   alum & mag hydroxide-simeth (MAALOX/MYLANTA) 200-200-20 MG/5ML suspension 30 mL, 30 mL, Oral, Once, Carollee Herter, DO   apixaban (ELIQUIS) tablet 5 mg, 5 mg, Oral, BID, Dorcas Carrow, MD, 5 mg at 12/30/22 0940   atorvastatin (LIPITOR) tablet 20 mg, 20 mg, Oral, Daily, Eric Form, PA-C, 20 mg at 12/30/22 0941   buPROPion (WELLBUTRIN XL) 24 hr tablet 300 mg, 300 mg, Oral, Daily, Eric Form, PA-C, 300 mg at  12/30/22 0940   Chlorhexidine Gluconate Cloth 2 % PADS 6 each, 6 each, Topical, Daily, Eric Form, PA-C, 6 each at 12/30/22 1251   colestipol (COLESTID) tablet 1 g, 1 g, Oral, Daily, Kabrich, Martha H, PA-C, 1 g at 12/30/22 1228   donepezil (ARICEPT) tablet 5 mg, 5 mg, Oral, Daily, Eric Form, PA-C, 5 mg at 12/30/22 0941   feeding supplement (ENSURE ENLIVE / ENSURE PLUS) liquid 237 mL, 237 mL, Oral, TID BM, Barnetta Chapel, PA-C, 237 mL at 12/30/22 0941   Gerhardt's butt cream, , Topical, QID, Dorcas Carrow, MD, 1 Application at 12/30/22 0941   haloperidol (HALDOL) tablet 5 mg, 5 mg, Oral, Daily, Ghimire, Lyndel Safe, MD   haloperidol (HALDOL) tablet 7.5 mg, 7.5 mg, Oral, QHS, Ghimire, Kuber, MD   haloperidol lactate (HALDOL) injection 2.5 mg, 2.5 mg, Intravenous, Q4H PRN, Dorcas Carrow, MD, 2.5 mg at 12/30/22 0554   methocarbamol (ROBAXIN) 1,000 mg in dextrose 5 % 100 mL IVPB, 1,000 mg, Intravenous, Q8H, Maczis, Elmer Sow, PA-C, Last Rate: 220 mL/hr at 12/30/22 0548, 1,000 mg at 12/30/22 0548   metoprolol tartrate (LOPRESSOR) injection 5 mg, 5 mg, Intravenous, Q2H PRN, Amin, Ankit Chirag, MD, 5 mg at 12/29/22 1343   metoprolol tartrate (LOPRESSOR) tablet 75 mg, 75 mg, Oral, BID, Laverda Page B, NP, 75 mg at 12/30/22 0940   ondansetron (ZOFRAN) tablet 4 mg, 4 mg, Oral, Q6H PRN, 4 mg at 12/27/22 1126 **OR** ondansetron (ZOFRAN) injection 4 mg, 4 mg, Intravenous, Q6H PRN, Eric Form, PA-C, 4 mg at 12/29/22 1143   pantoprazole (PROTONIX) injection 40 mg, 40 mg, Intravenous, Q2200, Carollee Herter, DO, 40 mg at 12/29/22 2131   phenol (CHLORASEPTIC) mouth spray 1 spray, 1 spray, Mouth/Throat, PRN, Dorcas Carrow, MD, 1 spray at 12/30/22 0550   promethazine (PHENERGAN) 12.5 mg in sodium chloride 0.9 % 50 mL IVPB, 12.5 mg, Intravenous, Q6H PRN, Dorcas Carrow, MD, Last Rate: 200 mL/hr at 12/27/22 2215, 12.5 mg at 12/27/22 2215   scopolamine (TRANSDERM-SCOP) 1 MG/3DAYS 1.5 mg, 1 patch,  Transdermal, Q72H, Howerter, Justin B, DO, 1.5 mg at 12/29/22 2132   sodium chloride flush (NS) 0.9 % injection 10-40 mL, 10-40 mL, Intracatheter, Q12H, Kabrich, Martha H, PA-C, 10 mL at 12/30/22 0943   sodium chloride flush (NS) 0.9 % injection 10-40 mL, 10-40 mL, Intracatheter, PRN, Eric Form, PA-C, 10 mL at 12/25/22 1742   TPN ADULT (ION), , Intravenous, Continuous TPN, Dang, Thuy D, RPH, Last Rate: 100 mL/hr at 12/29/22 1747, New Bag  at 12/29/22 1747   TPN ADULT (ION), , Intravenous, Continuous TPN, Dang, Thuy D, RPH   traMADol (ULTRAM) tablet 50 mg, 50 mg, Oral, Q6H PRN, Chevis Pretty III, MD, 50 mg at 12/29/22 1411   traZODone (DESYREL) tablet 100 mg, 100 mg, Oral, QHS, Ghimire, Lyndel Safe, MD, 100 mg at 12/29/22 2124  Allergies: Allergies  Allergen Reactions   Lisinopril Cough   Seroquel [Quetiapine] Other (See Comments)   Zolpidem Other (See Comments)    "Got me in outer space"        Objective  Vital signs:  Temp:  [97.3 F (36.3 C)-98.4 F (36.9 C)] 98.2 F (36.8 C) (05/27 1135) Pulse Rate:  [87-106] 106 (05/27 1135) Resp:  [18-26] 18 (05/27 1135) BP: (109-130)/(76-94) 112/94 (05/27 1135) SpO2:  [94 %-98 %] 98 % (05/27 1135) Weight:  [88.9 kg] 88.9 kg (05/27 0325)  Psychiatric Specialty Exam:  Presentation  General Appearance: Appropriate for Environment; Casual  Eye Contact:Good  Speech:-- (mildly slowed)  Speech Volume:Normal  Handedness:No data recorded  Mood and Affect  Mood:-- (I'm having a good day)  Affect:Appropriate; Full Range   Thought Process  Thought Processes:Coherent; Goal Directed  Descriptions of Associations:Intact  Orientation:Full (Time, Place and Person) (uses cues)  Thought Content:-- (devoid of paranoia/delusions)  History of Schizophrenia/Schizoaffective disorder:No data recorded Duration of Psychotic Symptoms:No data recorded Hallucinations:Hallucinations: None  Ideas of Reference:None  Suicidal Thoughts:Suicidal  Thoughts: No  Homicidal Thoughts:Homicidal Thoughts: No   Sensorium  Memory:Immediate Poor; Recent Poor; Remote Fair  Judgment:No data recorded Insight:Fair   Executive Functions  Concentration:Fair  Attention Span:Fair  Recall:Poor  Fund of Knowledge:Good  Language:Good   Psychomotor Activity  Psychomotor Activity:Psychomotor Activity: -- (Tremor x 4 limbs)   Assets  Assets:Communication Skills; Social Support   Sleep  Sleep:Sleep: Fair    Physical Exam: Physical Exam HENT:     Head: Normocephalic.  Pulmonary:     Effort: Pulmonary effort is normal.  Neurological:     General: No focal deficit present.     Mental Status: He is alert and oriented to person, place, and time.     Comments: Tremor x 4/4 limbs, muscle twitching at baseline No areas of dystonia on exam     Blood pressure (!) 112/94, pulse (!) 106, temperature 98.2 F (36.8 C), temperature source Axillary, resp. rate 18, height 6' 0.5" (1.842 m), weight 88.9 kg, SpO2 98 %. Body mass index is 26.22 kg/m.

## 2022-12-31 DIAGNOSIS — E440 Moderate protein-calorie malnutrition: Secondary | ICD-10-CM | POA: Diagnosis not present

## 2022-12-31 DIAGNOSIS — K529 Noninfective gastroenteritis and colitis, unspecified: Secondary | ICD-10-CM | POA: Diagnosis not present

## 2022-12-31 DIAGNOSIS — Z515 Encounter for palliative care: Secondary | ICD-10-CM | POA: Diagnosis not present

## 2022-12-31 DIAGNOSIS — R410 Disorientation, unspecified: Secondary | ICD-10-CM | POA: Diagnosis not present

## 2022-12-31 MED ORDER — MELATONIN 3 MG PO TABS
3.0000 mg | ORAL_TABLET | Freq: Every day | ORAL | Status: DC
  Administered 2022-12-31: 3 mg via ORAL
  Filled 2022-12-31 (×2): qty 1

## 2022-12-31 MED ORDER — TRAVASOL 10 % IV SOLN
INTRAVENOUS | Status: AC
  Filled 2022-12-31: qty 1248

## 2022-12-31 NOTE — Progress Notes (Signed)
PHARMACY - TOTAL PARENTERAL NUTRITION CONSULT NOTE  Indication: Small bowel diverticula perforation   Patient Measurements: Height: 6' 0.5" (184.2 cm) Weight: 89.1 kg (196 lb 6.9 oz) IBW/kg (Calculated) : 78.75 TPN AdjBW (KG): 95.3 Body mass index is 26.27 kg/m.  Assessment:  45 YOM presented to [facility] on 5/11 with fevers, abdominal pain and confusion.  PMH significant for CLL, and colon cancer post L hemicolectomy.  Recent history of pacemaker placement for Afib/brady and enteritis.  CT showed small bowel diverticulitis vs enteritis vs SBO with ischemia.  Patient was on a CLD on 5/13 and began having increased abdominal pain. S/p ex-lap with SBR 5/15.  Patient reports eating two meals a day and intake was reduced to ~50% given his abdominal pain that started months ago.  Patient feels that he lost weight but is unsure of how much.  Glucose / Insulin: no hx DM - CBGs < 180.  SSI D/C'ed Electrolytes: low Na/CO2 (max in TPN), others WNL Renal: SCr < 1, BUN WNL Hepatic: AST / TG WNL, ALT mildly elevated at 48, alk phos up to 276, tbili normalized, albumin 2.7, NH4 WNL Intake / Output; MIVF: UOP 1.2 ml/kg/hr, LBM 5/27 GI Imaging:  5/19 portable XR: nonobstructive bowel gas pattern 5/24 abd XR: cannot exclude ileus, colonic distension has decreased GI Surgeries / Procedures:  5/15 ex-lap with LoA, SBR with primary anastomosis  Central access: PICC placed 12/17/22 TPN start date: 12/18/22  Nutritional Goals: RD Estimated Needs Total Energy Estimated Needs: 2400-2600 Total Protein Estimated Needs: 120-135g Total Fluid Estimated Needs: >/=2L  Current Nutrition:  TPN Ensure Enlive BID - 1 charted given yesterday Soft diet - consuming ~10% of meals (last Phenergan use 5/24).  Wife reports barriers to eating more are fear of vomiting and his throat is sore from vomiting previously.  Patient doesn't eat much at baseline.  Delirium also impacting PO intake.  Plan:  Continue TPN at goal  rate of 100 mL/hr to provide 125g AA and 2426 kCal, meeting 100% of needs Electrolytes in TPN: Na 141mEq/L, K 65mEq/L, Ca 5 mEq/L, Mg 5mEq/L, Phos 63mmol/L, Cl:Ac 1:2. Add standard MVI and trace elements to TPN Standard TPN labs on Mon and Thurs  F/U PO intake to begin weaning TPN  Christoper Fabian, PharmD, BCPS Please see amion for complete clinical pharmacist phone list 12/31/2022, 7:56 AM

## 2022-12-31 NOTE — Progress Notes (Signed)
Nutrition Follow-up  DOCUMENTATION CODES:   Non-severe (moderate) malnutrition in context of chronic illness  INTERVENTION:  24-48 hour calorie count ordered Recommend weaning TPN given improvement in bowel function; consider Cortrak versus PEG tube given malnutrition and ongoing inability to adequately meet nutrition needs via PO intake; reached out to MD and Palliative Discontinue Ensure Snacks TID between meals Carnation Breakfast Essentials TID, each packet mixed with 8 ounces of 2% milk provides 13 grams of protein and 260 calories (vanilla) Magic cup TID with meals, each supplement provides 290 kcal and 9 grams of protein  NUTRITION DIAGNOSIS:   Moderate Malnutrition related to chronic illness (CLL, diverticulitis) as evidenced by moderate fat depletion, moderate muscle depletion, mild muscle depletion, energy intake < or equal to 75% for > or equal to 1 month. - remains applicable  GOAL:   Patient will meet greater than or equal to 90% of their needs - goal met via TPN  MONITOR:   Diet advancement, Labs, Weight trends, I & O's  REASON FOR ASSESSMENT:   Consult New TPN/TNA  ASSESSMENT:   Pt admitted with enteritis. PMH significant for CLL, paroxysmal afib on eliquis, colon cancer s/p L hemicolectomy (10+ years ago), GERD.  5/15 - s/p exlap, lysis of adhesion, small bowel resection with primary anastomosis; TPN initiated 5/20 - diet advanced to clear liquids  5/23 - diet advanced to full liquids 5/24 - abd xray-  cannot exclude ileus, colonic distension has decreased  5/25 - diet advanced to soft per surgery  5/27 - diet changed to dysphagia 3  Pt remains with delirium. Spoke with his wife present at bedside. She reports that his last episode of emesis was on Saturday morning. He has remained with minimal PO intake, typically bites of each meal. This morning d/t his level of alertness and a bad night, he did not eat breakfast. She later called and ordered frosted  flakes of which he consumed about half. He is not consuming Ensure as he does not like these supplements.   Discussed different nutrition supplements and ordering snacks to encourage smaller, more frequent PO intake as able. Pt's wife in agreement. Calorie count ordered to determine adequacy of nutritional intake.    Pt no longer meets criteria for TPN given improvement in bowel function and n/v however he would likely benefit from ongoing nutrition support (Cortrak vs PEG tube) d/t limited PO intake since diet advancement secondary to delirium. Discussed with MD. Continue to monitor PO intake on calorie count. Palliative care also following.   Weight stable between 88.2-89.5 kg within the last 4 days.    Medications: protonix, scopolamine patch q72h  Labs: sodium 133, ALT 48  Diet Order:   Diet Order             DIET DYS 3 Room service appropriate? Yes; Fluid consistency: Thin  Diet effective now                   EDUCATION NEEDS:   Education needs have been addressed  Skin:  Skin Assessment: Reviewed RN Assessment (closed abominal incision)  Last BM:  5/27 (type 4 large)  Height:   Ht Readings from Last 1 Encounters:  12/18/22 6' 0.5" (1.842 m)    Weight:   Wt Readings from Last 1 Encounters:  12/31/22 89.1 kg   BMI:  Body mass index is 26.27 kg/m.  Estimated Nutritional Needs:   Kcal:  2400-2600  Protein:  120-135g  Fluid:  >/=2L  Drusilla Kanner, RDN, LDN  Clinical Nutrition

## 2022-12-31 NOTE — Progress Notes (Signed)
PROGRESS NOTE    Jonathon Parker [facility]  WUJ:[id number] DOB: 1950/11/03 DOA: 12/14/2022 PCP: Paulina Fusi, MD    Brief Narrative:  72 year old gentleman with history of CLL, paroxysmal A-fib on Eliquis, history of colon cancer and left hemicolectomy more than 10 years ago, GERD who was suffering from enteritis and recently on oral antibiotics came to the emergency room with worsening abdominal pain and confusion associated with watery stools.  Workup in the emergency room revealed perforated diverticulitis of the small bowel in the right upper quadrant.  Started on IV antibiotics and resuscitation.  Patient underwent surgical exploration on 5/15.  Postoperative recovery complicated with delirium, A-fib and prolonged ileus. 5/22, with florid delirium features. 5/23, some improvement of symptoms.  Using haloperidol.  Recurrent nausea. 5/26, back to being delirious, occasional nausea.  Restless night.  Did not do well on the Seroquel. 5/27, multiple doses of haloperidol and responded well.  Still remains with intermittent delirium.   Assessment & Plan:   Perforated diverticulitis of the small bowel: Exploratory laparotomy, lysis of adhesion and small bowel resection with primary anastomosis 5/15 Prolonged ileus.  Now with return of bowel function, however poor tolerance of diet due to delirium. Remains on TPN due to poor oral intake, pharmacy managing. Continue mobility. Completed antibiotics after the procedure. Hopefully he can tolerate diet and can come off TPN.  Diet as tolerated, however due to his delirium difficult to tolerate diet.  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 Was on heparin perioperative.  No evidence of ongoing bleeding.   Now on Eliquis.  Rate controlled on metoprolol. Echocardiogram with ejection fraction 55 to 60%.  Acute blood loss anemia on chronic macrocytic anemia: Baseline hemoglobin about 12. Postop  hemoglobin 7.6-7.2-1 unit PRBC 8-1 additional unit of PRBC-hemoglobin more than 10.  Monitor closely. Hemoglobin has been stable since then.  Hospital-acquired delirium: Patient with underlying history of dementia.  He has developed significant tremulousness and confusion. Multiple episodes of intermittent delirium, difficult to control. Tried different medications and he responds well to Haldol only. 5/27,  Haloperidol 5 mg in the morning Haloperidol 7.5 mg at night with aim to decrease use of IV haloperidol.  Continue IV as needed. Trazodone 100 mg at night that he takes at home. TSH, B12, ammonia levels were normal. Continue high-dose Tylenol for pain and avoid narcotics. With persistent symptoms, I had asked for psychiatry help to manage symptoms. As per psychiatry, keeping current regimen.  They will follow-up next 24 to 48 hours.  Depression, dementia without behavioral disturbances: On Wellbutrin, Aricept .  As above.  Obstructive sleep apnea: Use CPAP as tolerated.  Unable to use.  Goal of care: Prolonged delirium is poor prognostic factor.  Palliative care following.  Currently remains full code and hopeful to have rehab. I have discussed every day with patient's wife at the bedside.   DVT prophylaxis: Place and maintain sequential compression device Start: 12/16/22 1321 apixaban (ELIQUIS) tablet 5 mg   Code Status: Full code Family Communication: Wife at the bedside. Disposition Plan: Status is: Inpatient Remains inpatient appropriate because: Significant delirium, TPN,      Consultants:  Cardiology Surgery Palliative care,  Psychiatry  Procedures:  Ex lap 5/50  Antimicrobials:  Completed Zosyn   Subjective:  Patient seen and examined.  Restless night.  He is answering of the questions appropriately then again reaches out in the air.  Wife reported he had a good day but all night he had been restless  and none of them slept. He ate well according to the wife  last night but not calm enough to eat today.   Objective: Vitals:   12/30/22 1916 12/31/22 0021 12/31/22 0447 12/31/22 0733  BP: 115/78 118/72 125/60 122/78  Pulse: 72 64 75 95  Resp:  20 20 (!) 21  Temp: 98.3 F (36.8 C) 98.2 F (36.8 C) 98.3 F (36.8 C) 97.7 F (36.5 C)  TempSrc: Oral Oral Oral Oral  SpO2:    94%  Weight:   89.1 kg   Height:        Intake/Output Summary (Last 24 hours) at 12/31/2022 0924 Last data filed at 12/31/2022 0817 Gross per 24 hour  Intake 0 ml  Output 2525 ml  Net -2525 ml    Filed Weights   12/29/22 0541 12/30/22 0325 12/31/22 0447  Weight: 89.5 kg 88.9 kg 89.1 kg    Examination:  General: Frail and debilitated.  Alert and awake.  Responds to simple questions.  Some spontaneous answers. Tremulous.  Reaching out to objects.  Pulling on clots.  Difficult to reorient. Does not have any focal neurological deficits.  Moves all extremities equally. Cardiovascular: S1-S2 normal.  Irregularly irregular.  Respiratory: Bilateral clear.  No added sounds. Gastrointestinal: Soft.  Midline incision with intact staples.  Nontender.  No rigidity or guarding. Ext: No edema.     Data Reviewed: I have personally reviewed following labs and imaging studies  CBC: Recent Labs  Lab 12/25/22 0418 12/25/22 2050 12/26/22 0550 12/27/22 1103 12/30/22 0810  WBC 6.4  --  7.5 7.8 5.8  HGB 8.5* 8.0* 8.6* 10.7* 9.7*  HCT 25.8* 25.2* 25.6* 32.6* 29.8*  MCV 98.5  --  98.5 96.7 100.3*  PLT 186  --  176 169 151    Basic Metabolic Panel: Recent Labs  Lab 12/25/22 0947 12/26/22 0550 12/28/22 0041 12/30/22 0810  NA 139 135 134* 133*  K 3.9 4.2 4.4 4.2  CL 106 106 106 101  CO2 23 22 21* 26  GLUCOSE 124* 110* 140* 128*  BUN 25* 22 22 23   CREATININE 0.74 0.78 0.83 0.85  CALCIUM 8.7* 8.8* 8.6* 8.2*  MG  --  2.1  --  2.2  PHOS  --  3.9 4.0 2.8    GFR: Estimated Creatinine Clearance: 87.6 mL/min (by C-G formula based on SCr of 0.85 mg/dL). Liver  Function Tests: Recent Labs  Lab 12/26/22 0550 12/30/22 0810  AST 27 28  ALT 45* 48*  ALKPHOS 192* 276*  BILITOT 0.8 0.7  PROT 5.8* 5.4*  ALBUMIN 3.0* 2.7*    No results for input(s): "LIPASE", "AMYLASE" in the last 168 hours. Recent Labs  Lab 12/25/22 0946 12/29/22 2119  AMMONIA 25 35    Coagulation Profile: No results for input(s): "INR", "PROTIME" in the last 168 hours. Cardiac Enzymes: No results for input(s): "CKTOTAL", "CKMB", "CKMBINDEX", "TROPONINI" in the last 168 hours. BNP (last 3 results) No results for input(s): "PROBNP" in the last 8760 hours. HbA1C: No results for input(s): "HGBA1C" in the last 72 hours. CBG: Recent Labs  Lab 12/25/22 0533 12/25/22 1122 12/25/22 1542 12/25/22 2309 12/26/22 0600  GLUCAP 132* 129* 128* 150* 108*    Lipid Profile: Recent Labs    12/30/22 0810  TRIG 123    Thyroid Function Tests: Recent Labs    12/29/22 1300  TSH 1.159    Anemia Panel: Recent Labs    12/29/22 1300  VITAMINB12 566    Sepsis Labs: No results for  input(s): "PROCALCITON", "LATICACIDVEN" in the last 168 hours.   No results found for this or any previous visit (from the past 240 hour(s)).        Radiology Studies: No results found.      Scheduled Meds:  acetaminophen  1,000 mg Oral Q6H   alum & mag hydroxide-simeth  30 mL Oral Once   apixaban  5 mg Oral BID   atorvastatin  20 mg Oral Daily   buPROPion  300 mg Oral Daily   Chlorhexidine Gluconate Cloth  6 each Topical Daily   colestipol  1 g Oral Daily   donepezil  5 mg Oral Daily   feeding supplement  237 mL Oral TID BM   Gerhardt's butt cream   Topical QID   haloperidol  5 mg Oral Daily   haloperidol  7.5 mg Oral QHS   metoprolol tartrate  75 mg Oral BID   pantoprazole (PROTONIX) IV  40 mg Intravenous Q2200   scopolamine  1 patch Transdermal Q72H   sodium chloride flush  10-40 mL Intracatheter Q12H   traZODone  100 mg Oral QHS   Continuous Infusions:  sodium  chloride Stopped (12/26/22 1410)   methocarbamol (ROBAXIN) IV 1,000 mg (12/31/22 0549)   promethazine (PHENERGAN) injection (IM or IVPB) 12.5 mg (12/27/22 2215)   TPN ADULT (ION) 100 mL/hr at 12/30/22 1731   TPN ADULT (ION)       LOS: 16 days    Time spent: 40 minutes     Dorcas Carrow, MD [facility] Pager [phone]

## 2022-12-31 NOTE — Consult Note (Signed)
[facility] Psychiatry New Face-to-Face Psychiatric Evaluation   Service Date: Dec 31, 2022 LOS:  LOS: 16 days    Assessment  Jonathon Parker is Jonathon 72 y.o. male admitted medically for 12/14/2022  9:35 PM for perfed diverticulitis. He carries the psychiatric diagnoses of depression and has Jonathon past medical history listed below. Psychiatry was consulted for prolonged delirium refractory to initial measures by Dr. Jerral Ralph.    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 Valentino Hue in [facility] hospital     Depression    GERD (gastroesophageal reflux disease)    controlled with nexium    Hypertension    Pacemaker     At this time, the patient's presentation is most consistent with  delirium, most likely due to multiple etiologies including but not limited to infection, medications, pain, altered sleep/wake cycle, and limited mobility. During this time period, minimization of delirogenic insults will be of utmost importance; this includes promoting the normal circadian cycle, minimizing lines/tubes, avoiding deliriogenic medications such as benzodiazepines and anticholinergic medications, and frequently reorienting the patient. Symptomatic treatment for agitation can be provided by antipsychotic medications, though it is important to remember that these do not treat the underlying etiology of delirium. Notably, there can be Jonathon time lag effect between treatment of Jonathon medical problem and resolution of delirium. This time lag effect may be of longer duration in the elderly, and those with underlying cognitive impairment or brain injury. On initial assessment 5/27, pt was alert, oriented, with poor memory of most of hospitalization. I provided psycho education for the time course of delirium for pt and wife. After this he had Jonathon "rough night" and was clearly  confused in AM; when I saw him in PM he was sitting in chair, and remembered most of yesterday although did Jonathon little worse on bedside neurocognitive testing. Will start melatonin.    Diagnoses:  Active Hospital problems: Principal Problem:   Enteritis Active Problems:   Sepsis (HCC)   Depression   CLL (chronic lymphocytic leukemia) (HCC)   Malnutrition of moderate degree   Palliative care by specialist     Plan  ## Safety and Observation Level:  - Based on my clinical evaluation, I estimate the patient to be at low risk of self harm in the current setting - At this time, we recommend Jonathon routine level of observation. This decision is based on my review of the chart including patient's history and current presentation, interview of the patient, mental status examination, and consideration of suicide risk including evaluating suicidal ideation, plan, intent, suicidal or self-harm behaviors, risk factors, and protective factors. This judgment is based on our ability to directly address suicide risk, implement suicide prevention strategies and develop Jonathon safety plan while the patient is in the clinical setting. Please contact our team if there is Jonathon concern that risk level has changed.   ## Medications:  -- c bupropion 300 mg PO qAM and trazodone 100 mg PO qPM -- would NOT start home zoloft at this point -- s melatonin 3 mg q1800  -- c current haloperidol (5 mg qAM, 7.5 mg qPM) until pt has 2 consecutive good days, then can taper.   ## Medical Decision Making Capacity:  Not formally assessed  ## Further Work-up:  -- none  currently   Appreciate thorough w/u by primary team   -- most recent EKG on 5/27 had QtC of 490 w/ HR 94, would correct to lower using fredericia  -- Pertinent labwork reviewed earlier this admission includes: TSH WNL, B12 wnl, low albumin (3.0 4d ago). Ammonia also normal. No elevated Cr.   CT head 5/22 NAICA but with atrophy  ## Disposition:  -- per primary    Thank you for this consult request. Recommendations have been communicated to the primary team.  We will continue to follow at this time.   Jonathon Parker Jonathon Parker   New history  Relevant Aspects of Hospital Course:  Admitted on 12/14/2022 for what was found to be perfed diverticulitis.Has been persistently delirious since surgery with partial response to haloperidol. Has failed to respond to quetiapine (seemed to make things much worse).   Patient Report:  Pt seen with wife in AM. He was clearly confused and she was struggling to feed him. In PM, he was sitting up in chair and remembered some of eval yesterday (made some jokes about it). He was less oriented (only to season and situation) and less confident with DOWB and some other general knowledge questions. Abstraction is pretty poor. No SI, HI, AH/VH or irritabilty at time of my evaluation. Discussed depakote v melatonin v rozerem with pt's son and daughter in law (both pharmacists) and settled on melatonin; will escalate if needed.  No real memory of earlier part of hospitalization.   ROS:  No SI, HI, AH/VH currently   Collateral information:  Spoke to wife at bedside  Patient started experiencing memory difficulties about Jonathon year ago, has had slow steady decline since then.  She does not remember exactly when he was diagnosed with depression, but does recall that it was primarily due to irritability.  Psychiatric History:  Information collected from pt, wife On bupropion 300 at home Sertraline 25 started 3/4 (seems like new script, confirmed w/ wife) Trazodone 100 is home med  No hx suicide attempts or hospitalizations.   Social History:  Lives with wife Kids + 3 granddaughters Spiritual not religious Retired Runner, broadcasting/film/video  Family History:  The patient's family history includes Prostate cancer in his brother.  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 Valentino Hue in [facility] hospital     Depression    GERD (gastroesophageal reflux disease)    controlled with nexium    Hypertension    Pacemaker     Surgical History: Past Surgical History:  Procedure Laterality Date   APPENDECTOMY     CARPAL TUNNEL RELEASE     left    CHOLECYSTECTOMY N/Jonathon 04/20/2019   Procedure: LAPAROSCOPIC CHOLECYSTECTOMY;  Surgeon: [name], MD;  Location: [facility] ORS;  Service: General;  Laterality: N/Jonathon;   COLON SURGERY  2002   10 inches of colon taken out    COLONOSCOPY     ENDOSCOPIC RETROGRADE CHOLANGIOPANCREATOGRAPHY (ERCP) WITH PROPOFOL N/Jonathon 03/01/2019   Procedure: ENDOSCOPIC RETROGRADE CHOLANGIOPANCREATOGRAPHY (ERCP) WITH PROPOFOL;  Surgeon: [name]., MD;  Location: [facility] ENDOSCOPY;  Service: Gastroenterology;  Laterality: N/Jonathon;   ERCP  03/01/2019   HERNIA REPAIR     bilateral inguinal    IR CHOLANGIOGRAM EXISTING TUBE  03/25/2019   IR PERC CHOLECYSTOSTOMY  03/02/2019   LAPAROTOMY  N/Jonathon 12/18/2022   Procedure: EXPLORATORY LAPAROTOMY, BOWEL RESECTION;  Surgeon: [name], MD;  Location: [facility] OR;  Service: General;  Laterality: N/Jonathon;   left heel reconstruction  01/2002   17 screws and 2 plates    PACEMAKER INSERTION     POLYPECTOMY     REMOVAL OF STONES  03/01/2019   Procedure: REMOVAL OF STONES;  Surgeon: [name]., MD;  Location: [facility] ENDOSCOPY;  Service: Gastroenterology;;   [name]  03/01/2019   Procedure: SPHINCTEROTOMY;  Surgeon: [name]., MD;  Location: [facility] ENDOSCOPY;  Service: Gastroenterology;;   tooth extracted     with laughing gas    UPPER GASTROINTESTINAL ENDOSCOPY      Medications:   Current Facility-Administered Medications:    0.9 %  sodium chloride infusion, , Intravenous, PRN, Amin, Loura Halt, MD, Stopped at 12/26/22 1410   albuterol (PROVENTIL) (2.5 MG/3ML) 0.083% nebulizer solution 2.5 mL, 2.5 mL,  Inhalation, Q6H PRN, Kabrich, Francena Hanly, PA-C   alum & mag hydroxide-simeth (MAALOX/MYLANTA) 200-200-20 MG/5ML suspension 30 mL, 30 mL, Oral, Once, Carollee Herter, DO   apixaban Everlene Balls) tablet 5 mg, 5 mg, Oral, BID, Dorcas Carrow, MD, 5 mg at 12/31/22 0908   atorvastatin (LIPITOR) tablet 20 mg, 20 mg, Oral, Daily, Eric Form, PA-C, 20 mg at 12/31/22 0908   buPROPion (WELLBUTRIN XL) 24 hr tablet 300 mg, 300 mg, Oral, Daily, Eric Form, PA-C, 300 mg at 12/31/22 0908   Chlorhexidine Gluconate Cloth 2 % PADS 6 each, 6 each, Topical, Daily, Eric Form, PA-C, 6 each at 12/31/22 9604   colestipol (COLESTID) tablet 1 g, 1 g, Oral, Daily, Kabrich, Martha H, PA-C, 1 g at 12/31/22 1241   donepezil (ARICEPT) tablet 5 mg, 5 mg, Oral, Daily, Eric Form, PA-C, 5 mg at 12/31/22 5409   Gerhardt's butt cream, , Topical, QID, Dorcas Carrow, MD, 1 Application at 12/31/22 1351   haloperidol (HALDOL) tablet 5 mg, 5 mg, Oral, Daily, Dorcas Carrow, MD, 5 mg at 12/31/22 8119   haloperidol (HALDOL) tablet 7.5 mg, 7.5 mg, Oral, QHS, Ghimire, Lyndel Safe, MD, 7.5 mg at 12/30/22 2114   haloperidol lactate (HALDOL) injection 2.5 mg, 2.5 mg, Intravenous, Q4H PRN, Dorcas Carrow, MD, 2.5 mg at 12/31/22 0454   melatonin tablet 3 mg, 3 mg, Oral, q1800, Whittney Steenson Jonathon   methocarbamol (ROBAXIN) 1,000 mg in dextrose 5 % 100 mL IVPB, 1,000 mg, Intravenous, Q8H, Maczis, Elmer Sow, PA-C, Stopped at 12/31/22 1418   metoprolol tartrate (LOPRESSOR) injection 5 mg, 5 mg, Intravenous, Q2H PRN, Amin, Ankit Chirag, MD, 5 mg at 12/29/22 1343   metoprolol tartrate (LOPRESSOR) tablet 75 mg, 75 mg, Oral, BID, Laverda Page B, NP, 75 mg at 12/31/22 0907   ondansetron (ZOFRAN) tablet 4 mg, 4 mg, Oral, Q6H PRN, 4 mg at 12/27/22 1126 **OR** ondansetron (ZOFRAN) injection 4 mg, 4 mg, Intravenous, Q6H PRN, Eric Form, PA-C, 4 mg at 12/29/22 1143   pantoprazole (PROTONIX) injection 40 mg, 40 mg, Intravenous, Q2200,  Carollee Herter, DO, 40 mg at 12/30/22 2106   phenol (CHLORASEPTIC) mouth spray 1 spray, 1 spray, Mouth/Throat, PRN, Dorcas Carrow, MD, 1 spray at 12/30/22 0550   promethazine (PHENERGAN) 12.5 mg in sodium chloride 0.9 % 50 mL IVPB, 12.5 mg, Intravenous, Q6H PRN, Dorcas Carrow, MD, Last Rate: 200 mL/hr at 12/31/22 1534, Infusion Verify at 12/31/22 1534   scopolamine (TRANSDERM-SCOP) 1 MG/3DAYS 1.5 mg, 1 patch, Transdermal, Q72H, Howerter, Justin B, DO, 1.5 mg at 12/29/22 2132  sodium chloride flush (NS) 0.9 % injection 10-40 mL, 10-40 mL, Intracatheter, Q12H, Eric Form, PA-C, 10 mL at 12/31/22 0909   sodium chloride flush (NS) 0.9 % injection 10-40 mL, 10-40 mL, Intracatheter, PRN, Eric Form, PA-C, 10 mL at 12/25/22 1742   TPN ADULT (ION), , Intravenous, Continuous TPN, Dang, Thuy D, RPH, Last Rate: 100 mL/hr at 12/31/22 1534, Infusion Verify at 12/31/22 1534   TPN ADULT (ION), , Intravenous, Continuous TPN, Titus Mould, RPH   traMADol (ULTRAM) tablet 50 mg, 50 mg, Oral, Q6H PRN, Chevis Pretty III, MD, 50 mg at 12/29/22 1411   traZODone (DESYREL) tablet 100 mg, 100 mg, Oral, QHS, Ghimire, Lyndel Safe, MD, 100 mg at 12/30/22 2113  Allergies: Allergies  Allergen Reactions   Lisinopril Cough   Seroquel [Quetiapine] Other (See Comments)   Zolpidem Other (See Comments)    "Got me in outer space"        Objective  Vital signs:  Temp:  [97.5 F (36.4 C)-98.7 F (37.1 C)] 98.7 F (37.1 C) (05/28 1528) Pulse Rate:  [64-95] 85 (05/28 1528) Resp:  [19-21] 19 (05/28 1528) BP: (115-133)/(60-78) 133/78 (05/28 1528) SpO2:  [94 %-98 %] 98 % (05/28 1528) Weight:  [89.1 kg] 89.1 kg (05/28 0447)  Psychiatric Specialty Exam:  Presentation  General Appearance: Appropriate for Environment; Casual  Eye Contact:Fair  Speech:-- (mild/moderate slowing)  Speech Volume:Normal  Handedness:No data recorded  Mood and Affect  Mood:-- (Worse)  Affect:Congruent; Appropriate; Full  Range   Thought Process  Thought Processes:Coherent; Goal Directed  Descriptions of Associations:Intact  Orientation:Full (Time, Place and Person)  Thought Content:-- (devoid of paranoia/delusions)  History of Schizophrenia/Schizoaffective disorder:No data recorded Duration of Psychotic Symptoms:No data recorded Hallucinations:Hallucinations: None  Ideas of Reference:None  Suicidal Thoughts:Suicidal Thoughts: No  Homicidal Thoughts:Homicidal Thoughts: No   Sensorium  Memory:Immediate Poor; Recent Poor; Remote Fair  Judgment:Poor  Insight:Fair   Executive Functions  Concentration:Fair  Attention Span:Fair  Recall:Fair  Fund of Knowledge:Fair  Language:Fair   Psychomotor Activity  Psychomotor Activity:Psychomotor Activity: Tremor (x4 limbs)   Assets  Assets:Communication Skills; Social Support   Sleep  Sleep:Sleep: Fair    Physical Exam: Physical Exam HENT:     Head: Normocephalic.  Pulmonary:     Effort: Pulmonary effort is normal.  Neurological:     General: No focal deficit present.     Mental Status: He is alert and oriented to person, place, and time.     Comments: Tremor x 4/4 limbs, muscle twitching at baseline No areas of dystonia on exam    No real change in physical exam  Blood pressure 133/78, pulse 85, temperature 98.7 F (37.1 C), temperature source Axillary, resp. rate 19, height 6' 0.5" (1.842 m), weight 89.1 kg, SpO2 98 %. Body mass index is 26.27 kg/m.

## 2022-12-31 NOTE — Progress Notes (Signed)
Daily Progress Note   Patient Name: Jonathon Parker       Date: 12/31/2022 DOB: Mar 03, 1951  Age: 72 y.o. MRN#: [id number] Attending Physician: Jonathon Carrow, Parker Primary Care Physician: Jonathon Fusi, Parker Admit Date: 12/14/2022  Reason for Consultation/Follow-up: Establishing goals of care  Subjective: Patient was sitting up in bed. He was more alert today than yesterday and was able to participate in some conversation. His wife was at the bedside.  Length of Stay: 16  Current Medications: Scheduled Meds:   alum & mag hydroxide-simeth  30 mL Oral Once   apixaban  5 mg Oral BID   atorvastatin  20 mg Oral Daily   buPROPion  300 mg Oral Daily   Chlorhexidine Gluconate Cloth  6 each Topical Daily   colestipol  1 g Oral Daily   donepezil  5 mg Oral Daily   feeding supplement  237 mL Oral TID BM   Gerhardt's butt cream   Topical QID   haloperidol  5 mg Oral Daily   haloperidol  7.5 mg Oral QHS   metoprolol tartrate  75 mg Oral BID   pantoprazole (PROTONIX) IV  40 mg Intravenous Q2200   scopolamine  1 patch Transdermal Q72H   sodium chloride flush  10-40 mL Intracatheter Q12H   traZODone  100 mg Oral QHS    Continuous Infusions:  sodium chloride Stopped (12/26/22 1410)   methocarbamol (ROBAXIN) IV 1,000 mg (12/31/22 0549)   promethazine (PHENERGAN) injection (IM or IVPB) 12.5 mg (12/27/22 2215)   TPN ADULT (ION) 100 mL/hr at 12/30/22 1731   TPN ADULT (ION)      PRN Meds: sodium chloride, albuterol, haloperidol lactate, metoprolol tartrate, ondansetron **OR** ondansetron (ZOFRAN) IV, phenol, promethazine (PHENERGAN) injection (IM or IVPB), sodium chloride flush, traMADol  Physical Exam Vitals reviewed.  Constitutional:      Appearance: He is ill-appearing.   Neurological:     Mental Status: He is alert.             Vital Signs: BP 129/78 (BP Location: Left Arm)   Pulse 90   Temp (!) 97.5 F (36.4 C) (Oral)   Resp 20   Ht 6' 0.5" (1.842 m)   Wt 89.1 kg   SpO2 98%   BMI 26.27 kg/m  SpO2: SpO2: 98 % O2 Device:  O2 Device: Room Air O2 Flow Rate: O2 Flow Rate (L/min): 2 L/min  Intake/output summary:  Intake/Output Summary (Last 24 hours) at 12/31/2022 1221 Last data filed at 12/31/2022 1114 Gross per 24 hour  Intake 0 ml  Output 2150 ml  Net -2150 ml   LBM: Last BM Date : 12/31/22 Baseline Weight: Weight: 95.3 kg Most recent weight: Weight: 89.1 kg       Palliative Assessment/Data: 40%      Patient Active Problem List   Diagnosis Date Noted   Palliative care by specialist 12/29/2022   Malnutrition of moderate degree 12/18/2022   Enteritis 12/15/2022   Acute ischemic VBA thalamic stroke, right (HCC) 11/24/2020   Mild intermittent asthma 01/28/2020   Dyspnea 05/11/2019   Thrombocytosis 05/11/2019   Alcohol dependence (HCC) 05/11/2019   Abnormal findings on diagnostic imaging of gall bladder 05/10/2019   Essential hypertension 04/22/2019   Post-operative complication 04/21/2019   Acute respiratory failure with hypoxia (HCC) 04/21/2019   SIRS (systemic inflammatory response syndrome) (HCC) 04/21/2019   Status post laparoscopic cholecystectomy 04/21/2019   HCAP (healthcare-associated pneumonia) 04/21/2019   CLL (chronic lymphocytic leukemia) (HCC) 04/21/2019   Calculous cholecystitis 02/28/2019   Campylobacter diarrhea    Diarrhea of infectious origin    STEC (Shiga toxin-producing Escherichia coli)    Elevated LFTs    Hypokalemia    Gastroenteritis 01/19/2017   Sepsis (HCC) 01/19/2017   GERD (gastroesophageal reflux disease) 01/19/2017   Depression 01/19/2017    Palliative Care Assessment & Plan   Patient Profile: 72 y.o. male  with past medical history of CLL, paroxysmal A-fib on Eliquis, history of colon  cancer and left hemicolectomy more than 10 years ago, GERD who was suffering from enteritis and recently on oral antibiotics came to the emergency room with worsening abdominal pain and confusion associated with watery stools.  Workup in the emergency room revealed perforated diverticulitis of the small bowel in the right upper quadrant.  He was admitted on 12/14/2022.   Started on IV antibiotics and resuscitation.  Patient underwent surgical exploration on 5/15.  Postoperative recovery complicated with delirium, A-fib and prolonged ileus. 5/22, with florid delirium features.   Assessment: Last night was not good. The patient is able to participate in some conversations but goes in and out of participating and jerking movements. His wife says psychiatry saw him when he was having a good moment.  We continued discussion from yesterday. We discussed best case scenario of patient going to [facility] for rehab. We discussed that if his delirium continues he might not be able to participate in rehab and might need to dc to a SNF. I encouraged the patient's wife Jonathon Parker to continue to ask questions and to discuss possibilities with her children.  Recommendations/Plan: Full Code Full scope Continued discussion around Code Status and GOC Continued PMT support  Goals of Care and Additional Recommendations: Limitations on Scope of Treatment: Full Scope Treatment  Code Status:    Code Status Orders  (From admission, onward)           Start     Ordered   12/15/22 0849  Full code  Continuous       Question:  By:  Answer:  Consent: discussion documented in EHR   12/15/22 0850           Code Status History     Date Active Date Inactive Code Status Order ID Comments User Context   11/24/2020 1326 11/26/2020 2131 Full Code [id number]  Jonathon Parker,  Jonathon Parker ED   05/11/2019 0212 05/13/2019 2109 Full Code [id number]  Jonathon Parker Inpatient   04/21/2019 2307 04/23/2019 1903 Full Code  [id number]  Jonathon Parker ED   02/28/2019 0727 03/07/2019 2004 Full Code [id number]  Jonathon Parker ED   01/19/2017 1839 01/23/2017 1711 Full Code [id number]  Ghimire, Jonathon Lean, Parker Inpatient       Prognosis:  Unable to determine  Discharge Planning: To Be Determined  Care plan was discussed with Dr. Jerral Ralph  Thank you for allowing the Palliative Medicine Team to assist in the care of this patient.  Time spent 35 minutes   Detailed review of medical records ( labs, imaging, vital signs), medically appropriate exam, counseling and education to patient and his patient's wife, documenting clinical information, medication management, coordination of care.   Sherryll Burger, Parker  Please contact Palliative Medicine Team phone at [phone] for questions and concerns.

## 2022-12-31 NOTE — Progress Notes (Signed)
Occupational Therapy Treatment Patient Details Name: Jonathon Parker [facility] MRN: [id number] DOB: February 15, 1951 Today's Date: 12/31/2022   History of present illness 72 y.o. male presented to the ED 12/14/22 with with worsening abdominal pain and confusion. Pt found to have perforated bowel and underwent bowel resection on 5/15. Post op complications include delirium, afib, and prolonged ileus. PMH - HTN, R thalamic CVA, colon CA, CLL, GERD, Depression, enteritis, sepsis, mild cognitive impairment, pacer.   OT comments  Pt in bed and more alert today upon OT and PT arrival, pt's wife present and very supportive. Pt asked by PT orientation question of pt's current location and pt responded with geographic coordinates. Pt sat EOB with increased time and effort, min A +2 sit - stand and to RW and to transfer to chair. Pt seated at sink for grooming/hygiene tasks with min assist to forward flex trunk to reach sink due to posterior and L leaning. Pt used RW for functional mobility in hallway with cues to decrease speed of pace due to IV lines and chair follow. Pt very talkative and comedic this session. Pt's HR 104 at rest, increasing to 114 throughout with activity. OT will continue to follow acutely to maximize level of function and safety    Recommendations for follow up therapy are one component of a multi-disciplinary discharge planning process, led by the attending physician.  Recommendations may be updated based on patient status, additional functional criteria and insurance authorization.    Assistance Recommended at Discharge Frequent or constant Supervision/Assistance  Patient can return home with the following  Two people to help with walking and/or transfers;A lot of help with bathing/dressing/bathroom;Assistance with cooking/housework;Assist for transportation;Assistance with feeding;Help with stairs or ramp for entrance   Equipment Recommendations  Other (comment) (TBD at SNF)     Recommendations for Other Services      Precautions / Restrictions Precautions Precautions: Fall Restrictions Weight Bearing Restrictions: No       Mobility Bed Mobility Overal bed mobility: Needs Assistance Bed Mobility: Supine to Sit     Supine to sit: Supervision     General bed mobility comments: Pt required increased time and use of anchoring from therapist to sit trunk upright    Transfers Overall transfer level: Needs assistance Equipment used: Rolling walker (2 wheels) Transfers: Sit to/from Stand, Bed to chair/wheelchair/BSC Sit to Stand: Min assist, +2 physical assistance     Step pivot transfers: Min assist, +2 physical assistance     General transfer comment: Pt with min verbal cues for correct hand placement and general safety, pace of movement     Balance Overall balance assessment: Needs assistance Sitting-balance support: Bilateral upper extremity supported, Feet supported Sitting balance-Leahy Scale: Fair Sitting balance - Comments: Able to sit EOB with posterior and Left trunk lean Postural control: Posterior lean Standing balance support: Bilateral upper extremity supported, During functional activity Standing balance-Leahy Scale: Poor                             ADL either performed or assessed with clinical judgement   ADL Overall ADL's : Needs assistance/impaired     Grooming: Wash/dry hands;Wash/dry face;Sitting;Supervision/safety Grooming Details (indicate cue type and reason): assist with trunk forward flexion to reach water knobs         Upper Body Dressing : Minimal assistance;Sitting Upper Body Dressing Details (indicate cue type and reason): min A due to balance/posterior leaning Lower Body Dressing: Moderate assistance;Sitting/lateral leans Lower Body  Dressing Details (indicate cue type and reason): donning socks Toilet Transfer: Minimal assistance;+2 for physical assistance;Cueing for safety Toilet Transfer  Details (indicate cue type and reason): simulated to chair Toileting- Clothing Manipulation and Hygiene: Moderate assistance;Sit to/from stand       Functional mobility during ADLs: Minimal assistance;+2 for physical assistance;Cueing for safety;Rolling walker (2 wheels) General ADL Comments: Pt's HR 104 at rest, increasing to 114 with activity    Extremity/Trunk Assessment Upper Extremity Assessment Upper Extremity Assessment: Generalized weakness   Lower Extremity Assessment Lower Extremity Assessment: Defer to PT evaluation        Vision Ability to See in Adequate Light: 0 Adequate Patient Visual Report: No change from baseline     Perception     Praxis      Cognition       Area of Impairment: Attention, Following commands, Safety/judgement, Problem solving, Awareness                     Memory: Decreased short-term memory Following Commands: Follows multi-step commands with increased time Safety/Judgement: Decreased awareness of safety, Decreased awareness of deficits   Problem Solving: Slow processing, Difficulty sequencing, Requires tactile cues, Requires verbal cues General Comments: Pt more alert today, very talkative and jovial        Exercises      Shoulder Instructions       General Comments      Pertinent Vitals/ Pain       Pain Assessment Pain Assessment: Faces Faces Pain Scale: Hurts a little bit Pain Location: abdomen Pain Descriptors / Indicators: Sore Pain Intervention(s): Monitored during session, Repositioned  Home Living                                          Prior Functioning/Environment              Frequency  Min 2X/week        Progress Toward Goals  OT Goals(current goals can now be found in the care plan section)  Progress towards OT goals: Progressing toward goals     Plan Discharge plan remains appropriate    Co-evaluation    PT/OT/SLP Co-Evaluation/Treatment: Yes Reason for  Co-Treatment: Complexity of the patient's impairments (multi-system involvement);To address functional/ADL transfers;For patient/therapist safety   OT goals addressed during session: ADL's and self-care;Proper use of Adaptive equipment and DME      AM-PAC OT "6 Clicks" Daily Activity     Outcome Measure   Help from another person eating meals?: None Help from another person taking care of personal grooming?: A Little Help from another person toileting, which includes using toliet, bedpan, or urinal?: A Lot Help from another person bathing (including washing, rinsing, drying)?: A Lot Help from another person to put on and taking off regular upper body clothing?: A Little Help from another person to put on and taking off regular lower body clothing?: A Lot 6 Click Score: 16    End of Session Equipment Utilized During Treatment: Gait belt;Rolling walker (2 wheels)  OT Visit Diagnosis: Muscle weakness (generalized) (M62.81);Pain;Unsteadiness on feet (R26.81);Other symptoms and signs involving cognitive function Pain - part of body:  (abdomen)   Activity Tolerance Patient tolerated treatment well   Patient Left with call bell/phone within reach;in chair;with chair alarm set;with family/visitor present   Nurse Communication          Time: [phone]  OT Time Calculation (min): 35 min  Charges: OT General Charges $OT Visit: 1 Visit OT Treatments $Self Care/Home Management : 8-22 mins    Galen Manila 12/31/2022, 3:51 PM

## 2022-12-31 NOTE — Progress Notes (Addendum)
Physical Therapy Treatment Patient Details Name: Lazarius Mckenney [facility] MRN: [id number] DOB: November 04, 1950 Today's Date: 12/31/2022   History of Present Illness 72 y.o. male presented to the ED 12/14/22 with with worsening abdominal pain and confusion. Pt found to have perforated bowel and underwent bowel resection on 5/15. Post op complications include delirium, afib, and prolonged ileus. PMH - HTN, R thalamic CVA, colon CA, CLL, GERD, Depression, enteritis, sepsis, mild cognitive impairment, pacer.    PT Comments    Pt tolerated treatment well today. Co treat with OT. Pt was able to progress ambulation today by ambulating short distance in hallway with RW +2 Mod A and chair follow. VSS today allowing for mobility progress. No change in DC/DME recs at this time. PT will continue to follow.  Recommendations for follow up therapy are one component of a multi-disciplinary discharge planning process, led by the attending physician.  Recommendations may be updated based on patient status, additional functional criteria and insurance authorization.  Follow Up Recommendations  Can patient physically be transported by private vehicle: No    Assistance Recommended at Discharge Frequent or constant Supervision/Assistance  Patient can return home with the following Two people to help with walking and/or transfers;Two people to help with bathing/dressing/bathroom;Direct supervision/assist for financial management;Direct supervision/assist for medications management;Assistance with cooking/housework;Help with stairs or ramp for entrance   Equipment Recommendations  Other (comment) (Per accepting facility)    Recommendations for Other Services       Precautions / Restrictions Precautions Precautions: Fall Restrictions Weight Bearing Restrictions: No     Mobility  Bed Mobility Overal bed mobility: Needs Assistance Bed Mobility: Supine to Sit     Supine to sit: Supervision     General bed  mobility comments: Pt required increased time and use of anchoring from therapist to sit trunk upright    Transfers Overall transfer level: Needs assistance Equipment used: Rolling walker (2 wheels) Transfers: Sit to/from Stand, Bed to chair/wheelchair/BSC Sit to Stand: Min assist, +2 physical assistance   Step pivot transfers: Min assist, +2 physical assistance       General transfer comment: Pt with min verbal cues for correct hand placement and general safety, pace of movement    Ambulation/Gait Ambulation/Gait assistance: Mod assist, +2 safety/equipment (Chair follow) Gait Distance (Feet): 50 Feet Assistive device: Rolling walker (2 wheels) Gait Pattern/deviations: Ataxic, Staggering left, Staggering right, Drifts right/left, Narrow base of support, Trunk flexed, Decreased stride length, Step-through pattern Gait velocity: decr     General Gait Details: Pt able to ambulate short distance in hallway today with Mod A. Pt had a very unsteady ataxic gait pattern requiring Mod A to correct. HR stable   Stairs             Wheelchair Mobility    Modified Rankin (Stroke Patients Only)       Balance Overall balance assessment: Needs assistance Sitting-balance support: Bilateral upper extremity supported, Feet supported Sitting balance-Leahy Scale: Fair Sitting balance - Comments: Able to sit EOB without trunk lean Postural control: Posterior lean Standing balance support: Bilateral upper extremity supported, During functional activity Standing balance-Leahy Scale: Poor                              Cognition Arousal/Alertness: Awake/alert Behavior During Therapy: WFL for tasks assessed/performed Overall Cognitive Status: Impaired/Different from baseline Area of Impairment: Attention, Following commands, Safety/judgement, Problem solving, Awareness  Memory: Decreased short-term memory Following Commands: Follows multi-step  commands with increased time Safety/Judgement: Decreased awareness of safety, Decreased awareness of deficits   Problem Solving: Slow processing, Difficulty sequencing, Requires tactile cues, Requires verbal cues General Comments: Pt initally appeared confused reaching for things that werent there. When asked location pt responded with coordinates which werent far off. As session progressed pt became more alert and talkative.        Exercises      General Comments General comments (skin integrity, edema, etc.): VSS on RA.      Pertinent Vitals/Pain Pain Assessment Pain Assessment: Faces Faces Pain Scale: Hurts a little bit Pain Location: abdomen Pain Descriptors / Indicators: Sore Pain Intervention(s): Monitored during session, Repositioned    Home Living                          Prior Function            PT Goals (current goals can now be found in the care plan section) Progress towards PT goals: Progressing toward goals    Frequency    Min 1X/week      PT Plan Current plan remains appropriate    Co-evaluation PT/OT/SLP Co-Evaluation/Treatment: Yes Reason for Co-Treatment: Complexity of the patient's impairments (multi-system involvement);To address functional/ADL transfers;For patient/therapist safety PT goals addressed during session: Mobility/safety with mobility;Balance OT goals addressed during session: ADL's and self-care;Proper use of Adaptive equipment and DME      AM-PAC PT "6 Clicks" Mobility   Outcome Measure  Help needed turning from your back to your side while in a flat bed without using bedrails?: A Little Help needed moving from lying on your back to sitting on the side of a flat bed without using bedrails?: A Little Help needed moving to and from a bed to a chair (including a wheelchair)?: A Lot Help needed standing up from a chair using your arms (e.g., wheelchair or bedside chair)?: A Lot Help needed to walk in hospital room?: A  Lot Help needed climbing 3-5 steps with a railing? : Total 6 Click Score: 13    End of Session Equipment Utilized During Treatment: Gait belt Activity Tolerance: Patient tolerated treatment well Patient left: in chair;with call bell/phone within reach;with chair alarm set;with family/visitor present Nurse Communication: Mobility status PT Visit Diagnosis: Other abnormalities of gait and mobility (R26.89);Muscle weakness (generalized) (M62.81)     Time: 9147-8295 PT Time Calculation (min) (ACUTE ONLY): 37 min  Charges:  $Gait Training: 8-22 mins                     Shela Nevin, PT, DPT Acute Rehab Services [phone]    Gladys Damme 12/31/2022, 4:13 PM

## 2022-12-31 NOTE — Progress Notes (Signed)
13 Days Post-Op   Subjective/Chief Complaint:   Night with delirium, persistently delirious this morning.   Objective: Vital signs in last 24 hours: Temp:  [97.7 F (36.5 C)-98.3 F (36.8 C)] 97.7 F (36.5 C) (05/28 0733) Pulse Rate:  [64-106] 95 (05/28 0733) Resp:  [18-21] 21 (05/28 0733) BP: (112-125)/(60-94) 122/78 (05/28 0733) SpO2:  [94 %-98 %] 94 % (05/28 0733) Weight:  [89.1 kg] 89.1 kg (05/28 0447) Last BM Date : 12/31/22  Intake/Output from previous day: 05/27 0701 - 05/28 0700 In: -  Out: 2525 [Urine:2525] Intake/Output this shift: No intake/output data recorded.  Alert, not oriented to person place or time, answering some questions but for the most part speech is unintelligible Unlabored respirations Abdomen is soft, nontender, nondistended.  Incision is intact with staples, no evidence of infection  Lab Results:  Recent Labs    12/30/22 0810  WBC 5.8  HGB 9.7*  HCT 29.8*  PLT 151    BMET Recent Labs    12/30/22 0810  NA 133*  K 4.2  CL 101  CO2 26  GLUCOSE 128*  BUN 23  CREATININE 0.85  CALCIUM 8.2*    PT/INR No results for input(s): "LABPROT", "INR" in the last 72 hours. ABG No results for input(s): "PHART", "HCO3" in the last 72 hours.  Invalid input(s): "PCO2", "PO2"  Studies/Results: No results found.  Anti-infectives: Anti-infectives (From admission, onward)    Start     Dose/Rate Route Frequency Ordered Stop   12/19/22 2000  piperacillin-tazobactam (ZOSYN) IVPB 3.375 g        3.375 g 12.5 mL/hr over 240 Minutes Intravenous Every 8 hours 12/19/22 1550 12/23/22 2359   12/16/22 1100  piperacillin-tazobactam (ZOSYN) IVPB 3.375 g  Status:  Discontinued        3.375 g 12.5 mL/hr over 240 Minutes Intravenous Every 8 hours 12/16/22 0954 12/19/22 1550   12/16/22 0400  ampicillin-sulbactam (UNASYN) 1.5 g in sodium chloride 0.9 % 100 mL IVPB  Status:  Discontinued        1.5 g 200 mL/hr over 30 Minutes Intravenous Every 6 hours  12/16/22 0258 12/16/22 0855   12/15/22 1630  piperacillin-tazobactam (ZOSYN) IVPB 3.375 g  Status:  Discontinued        3.375 g 12.5 mL/hr over 240 Minutes Intravenous Every 8 hours 12/15/22 1528 12/16/22 0307   12/15/22 1000  azithromycin (ZITHROMAX) tablet 500 mg  Status:  Discontinued        500 mg Oral Daily 12/15/22 0900 12/16/22 0258   12/15/22 0045  piperacillin-tazobactam (ZOSYN) IVPB 3.375 g        3.375 g 12.5 mL/hr over 240 Minutes Intravenous  Once 12/15/22 0044 12/15/22 0446       Assessment/Plan: s/p Procedure(s): EXPLORATORY LAPAROTOMY, BOWEL RESECTION (N/A) Advance diet. Allow soft food Seems to be stable from surgical standpoint but now suffering with significant delirium Hg stable Continue tpn Afib per cards POD 13 s/p Exploratory laparotomy, lysis of adhesions, small bowel resection with primary anastomosis for Contained perforation of jejunal diverticulum by Dr. Corliss Skains on 12/18/22 - Path benign - wean TPN as he tolerates more food. Aspiration risk but cleared by speech - Mobilize - PT/OT, sleep hygiene - Pulm toilet, IS, wean o2 as able   FEN -soft diet as tolerated, PICC/TPN VTE - SCDs, heparin gtt.  ID - Zosyn Foley - Out POD 1, voiding.     CLL (last chemo in November but on venetoclax)  Recent resp issues - bronchitis, pneumonia, RSV  A. Fib/Bradycardia - pacemaker placement about a month ago   LOS: 16 days    Jonathon Parker 12/31/2022

## 2023-01-01 DIAGNOSIS — K529 Noninfective gastroenteritis and colitis, unspecified: Secondary | ICD-10-CM | POA: Diagnosis not present

## 2023-01-01 MED ORDER — GUAIFENESIN 100 MG/5ML PO LIQD
5.0000 mL | Freq: Four times a day (QID) | ORAL | Status: DC | PRN
  Administered 2023-01-02 (×2): 5 mL via ORAL
  Filled 2023-01-01 (×2): qty 10

## 2023-01-01 MED ORDER — TRAVASOL 10 % IV SOLN
INTRAVENOUS | Status: AC
  Filled 2023-01-01: qty 624

## 2023-01-01 MED ORDER — MELATONIN 3 MG PO TABS: 3.0000 mg | ORAL_TABLET | Freq: Every day | ORAL | Status: DC

## 2023-01-01 MED ORDER — METHOCARBAMOL 1000 MG/10ML IJ SOLN: 500.0000 mg | Freq: Three times a day (TID) | INTRAVENOUS | Status: DC | PRN

## 2023-01-01 MED ORDER — MENTHOL 3 MG MT LOZG
1.0000 | LOZENGE | OROMUCOSAL | Status: DC | PRN
  Filled 2023-01-01: qty 9

## 2023-01-01 MED ORDER — MELATONIN 3 MG PO TABS
3.0000 mg | ORAL_TABLET | Freq: Every day | ORAL | Status: DC
  Administered 2023-01-01: 3 mg via ORAL
  Filled 2023-01-01: qty 1

## 2023-01-01 NOTE — Progress Notes (Signed)
PHARMACY - TOTAL PARENTERAL NUTRITION CONSULT NOTE  Indication: Small bowel diverticula perforation   Patient Measurements: Height: 6' 0.5" (184.2 cm) Weight: 89.4 kg (197 lb 1.6 oz) IBW/kg (Calculated) : 78.75 TPN AdjBW (KG): 95.3 Body mass index is 26.36 kg/m.  Assessment:  44 YOM presented to [facility] on 5/11 with fevers, abdominal pain and confusion.  PMH significant for CLL, and colon cancer post L hemicolectomy.  Recent history of pacemaker placement for Afib/brady and enteritis.  CT showed small bowel diverticulitis vs enteritis vs SBO with ischemia.  Patient was on a CLD on 5/13 and began having increased abdominal pain. S/p ex-lap with SBR 5/15.  Patient reports eating two meals a day and intake was reduced to ~50% given his abdominal pain that started months ago.  Patient feels that he lost weight but is unsure of how much.  5/29 Pt advanced to dysphagia 3. RD added Carnation Breakfast and magic cup TID to help improve intake. Pt did not like Ensure. Calorie count ongoing but pt seems to only be taking bites or small amounts of most food items. He states that he just isn't hungry. D/w RD and she supports decreasing TPN to 1/2 rate tonight. Pt may need enteral nutrition if unable to meet needs via po route.   Glucose / Insulin: no hx DM - CBGs < 180.  SSI D/C'ed Electrolytes: low Na (max in TPN), others WNL Renal: SCr < 1, BUN WNL Hepatic: AST / TG WNL, ALT mildly elevated at 48, alk phos up to 276, tbili normalized, albumin 2.7, NH4 WNL Intake / Output; MIVF: UOP 1.5 ml/kg/hr, LBM 5/28 GI Imaging:  5/19 portable XR: nonobstructive bowel gas pattern 5/24 abd XR: cannot exclude ileus, colonic distension has decreased GI Surgeries / Procedures:  5/15 ex-lap with LoA, SBR with primary anastomosis  Central access: PICC placed 12/17/22 TPN start date: 12/18/22  Nutritional Goals: RD Estimated Needs Total Energy Estimated Needs: 2400-2600 Total Protein Estimated Needs:  120-135g Total Fluid Estimated Needs: >/=2L  Current Nutrition:  TPN Dysphagia 3 diet + Carnation breakfast + magic cup - consuming ~10% of meals (last Phenergan use 5/24).  Wife reports barriers to eating more are fear of vomiting and his throat is sore from vomiting previously.  Patient doesn't eat much at baseline.  Delirium also impacting PO intake.  Plan:  Decrease TPN to 1/2 goal rate (50 mL/hr) to provide 62.5g AA and 1213 kCal, meeting 50% of needs Electrolytes in TPN: Na 160mEq/L, K 55mEq/L, Ca 5 mEq/L, Mg 74mEq/L, Phos 44mmol/L, Cl:Ac 1:2. Add standard MVI and trace elements to TPN Standard TPN labs on Mon and Thurs  Will plan to wean TPN to off tomorrow if able. If unable to meet needs via oral route, will likely need enteral nutrition.  Christoper Fabian, PharmD, BCPS Please see amion for complete clinical pharmacist phone list 01/01/2023, 10:01 AM

## 2023-01-01 NOTE — Discharge Instructions (Signed)
[facility]      [facility] °[phone] ° °OPEN ABDOMINAL SURGERY: POST OP INSTRUCTIONS ° °Always review your discharge instruction sheet given to you by the facility where your surgery was performed. ° °IF YOU HAVE DISABILITY OR FAMILY LEAVE FORMS, YOU MUST BRING THEM TO THE OFFICE FOR PROCESSING.  PLEASE DO NOT GIVE THEM TO YOUR DOCTOR. ° °A prescription for pain medication may be given to you upon discharge.  Take your pain medication as prescribed, if needed.  If narcotic pain medicine is not needed, then you may take acetaminophen (Tylenol) or ibuprofen (Advil) as needed. °Take your usually prescribed medications unless otherwise directed. °If you need a refill on your pain medication, please contact your pharmacy. They will contact our office to request authorization.  Prescriptions will not be filled after 5pm or on week-ends. °You should follow a light diet the first few days after arrival home, such as soup and crackers, pudding, etc.unless your doctor has advised otherwise. A high-fiber, low fat diet can be resumed as tolerated.   Be sure to include lots of fluids daily. Most patients will experience some swelling and bruising on the chest and neck area.  Ice packs will help.  Swelling and bruising can take several days to resolve °Most patients will experience some swelling and bruising in the area of the incision. Ice pack will help. Swelling and bruising can take several days to resolve..  °It is common to experience some constipation if taking pain medication after surgery.  Increasing fluid intake and taking a stool softener will usually help or prevent this problem from occurring.  A mild laxative (Milk of Magnesia or Miralax) should be taken according to package directions if there are no bowel movements after 48 hours. ° You may have steri-strips (small skin tapes) in place directly over the incision.  These strips should be left on the skin for 7-10 days.  If your surgeon used skin  glue on the incision, you may shower in 24 hours.  The glue will flake off over the next 2-3 weeks.  Any sutures or staples will be removed at the office during your follow-up visit. You may find that a light gauze bandage over your incision may keep your staples from being rubbed or pulled. You may shower and replace the bandage daily. °ACTIVITIES:  You may resume regular (light) daily activities beginning the next day--such as daily self-care, walking, climbing stairs--gradually increasing activities as tolerated.  You may have sexual intercourse when it is comfortable.  Refrain from any heavy lifting or straining until approved by your doctor. °You may drive when you no longer are taking prescription pain medication, you can comfortably wear a seatbelt, and you can safely maneuver your car and apply brakes ° °You should see your doctor in the office for a follow-up appointment approximately two weeks after your surgery.  Make sure that you call for this appointment within a day or two after you arrive home to insure a convenient appointment time. ° °WHEN TO CALL YOUR DOCTOR: °Fever over 101.0 °Inability to urinate °Nausea and/or vomiting °Extreme swelling or bruising °Continued bleeding from incision. °Increased pain, redness, or drainage from the incision. °Difficulty swallowing or breathing °Muscle cramping or spasms. °Numbness or tingling in hands or feet or around lips. ° °The clinic staff is available to answer your questions during regular business hours.  Please don’t hesitate to call and ask to speak to one of the nurses if you have concerns. ° °For   further questions, please visit [email or web address]  °

## 2023-01-01 NOTE — Progress Notes (Signed)
PROGRESS NOTE  Jonathon Parker  ZOX:[id number] DOB: 1950/12/16 DOA: 12/14/2022 PCP: Paulina Fusi, MD   Brief Narrative: Patient is a 72 year old male with history of CLL, paroxysmal A-fib on Eliquis, history of colon cancer status post hemicolectomy more than 10 years ago, GERD, recent enteritis who presented here with complaint of abdominal pain, confusion, watery stools.  Workup in the emergency department showed perforated diverticulitis .  General surgery and  underwent surgical exploration.  Hospital course remarkable for persistent delirium.  General surgery, psychiatry, palliative care following.  Important events: 5/15.  Postoperative recovery complicated with delirium, A-fib and prolonged ileus. 5/22, with florid delirium features. 5/23, some improvement of symptoms.  Using haloperidol.  Recurrent nausea. 5/26, back to being delirious, occasional nausea.  Restless night.  Did not do well on the Seroquel. 5/27, multiple doses of haloperidol and responded well.  Still remains with intermittent delirium.  Assessment & Plan:  Principal Problem:   Enteritis Active Problems:   Sepsis (HCC)   Depression   CLL (chronic lymphocytic leukemia) (HCC)   Malnutrition of moderate degree   Palliative care by specialist  Perforated jejunal diverticulum: Presented with abdominal pain, watery stools.  Status post explorative laparotomy with lysis of adhesions, small bowel resection with primary anastomosis for contained perforation of jejunal diverticulum.  Currently on TPN, soft diet.  Hospital course remarkable for prolonged ileus.  Now with return of bowel function but poor oral intake due to delirium.  Completed antibiotics.  Continue to advance diet as tolerated.  Currently on dysphagia 3 diet General surgery following  Hospital acquired delirium: History of underlying cognitive dysfunction but usually alert and oriented at home. started becoming more confused after surgery, still  confused, episodes of intermittent delirium not responding to sedatives.  Psychiatry following for assistance.  Currently on Haldol, trazodone, bupropion.  TSH, vitamin B12, ammonia level normal.  Paroxysmal A-fib with RVR: History of tachybradycardia syndrome status post permanent pacemaker on 4/24.  On Eliquis for anticoagulation.  On  metoprolol.  Last echo showed EF of 55 to 60%  Acute blood loss anemia: Baseline hemoglobin around 12.  Postoperatively, hemoglobin dropped to the range of 7, monitor PRBC transfused.  Currently hemoglobin stable in the range of 10.  Depression/dementia: On Wellbutrin, Aricept.  Home Zoloft at hold  OSA: On CPAP at home but unable to use currently.  Deconditioning/debility: Patient seen by PT and OT and recommended skilled nursing physician discharge.  TOC consulted and following  Goals of care: Prolonged delirium, poor oral intake.  Remains full code.  Palliative care following.    Nutrition Problem: Moderate Malnutrition Etiology: chronic illness (CLL, diverticulitis)    DVT prophylaxis:Place and maintain sequential compression device Start: 12/16/22 1321 apixaban (ELIQUIS) tablet 5 mg     Code Status: Full Code  Family Communication: Discussed with wife at bedside on 5/29  Patient status:Inpatient  Patient is from :Home  Anticipated discharge to:[facility]  Estimated DC date:Not sure   Consultants: General surgery, palliative care, psychiatry  Procedures: Exploratory lap with lysis of adhesions  Antimicrobials:  Anti-infectives (From admission, onward)    Start     Dose/Rate Route Frequency Ordered Stop   12/19/22 2000  piperacillin-tazobactam (ZOSYN) IVPB 3.375 g        3.375 g 12.5 mL/hr over 240 Minutes Intravenous Every 8 hours 12/19/22 1550 12/23/22 2359   12/16/22 1100  piperacillin-tazobactam (ZOSYN) IVPB 3.375 g  Status:  Discontinued        3.375 g 12.5 mL/hr over 240  Minutes Intravenous Every 8 hours 12/16/22 0954 12/19/22 1550    12/16/22 0400  ampicillin-sulbactam (UNASYN) 1.5 g in sodium chloride 0.9 % 100 mL IVPB  Status:  Discontinued        1.5 g 200 mL/hr over 30 Minutes Intravenous Every 6 hours 12/16/22 0258 12/16/22 0855   12/15/22 1630  piperacillin-tazobactam (ZOSYN) IVPB 3.375 g  Status:  Discontinued        3.375 g 12.5 mL/hr over 240 Minutes Intravenous Every 8 hours 12/15/22 1528 12/16/22 0307   12/15/22 1000  azithromycin (ZITHROMAX) tablet 500 mg  Status:  Discontinued        500 mg Oral Daily 12/15/22 0900 12/16/22 0258   12/15/22 0045  piperacillin-tazobactam (ZOSYN) IVPB 3.375 g        3.375 g 12.5 mL/hr over 240 Minutes Intravenous  Once 12/15/22 0044 12/15/22 0446       Subjective: Patient seen and examined at bedside today.  He remains confused, mildly agitated.  Lying in bed.  Tossing  and turning on bed.  As per the wife, he had a good sleep last night.  Last bowel movement yesterday.  Continues to have poor oral intake, TPN running.  Having some dry cough.  Objective: Vitals:   12/31/22 1920 01/01/23 0031 01/01/23 0418 01/01/23 0717  BP:  118/74 (!) 141/85 124/86  Pulse:  92 95 100  Resp: 20 (!) 22 17 20   Temp:  98.3 F (36.8 C) 98.3 F (36.8 C) 98.1 F (36.7 C)  TempSrc:  Oral Oral Oral  SpO2:  99% 99% 97%  Weight:  89.4 kg    Height:        Intake/Output Summary (Last 24 hours) at 01/01/2023 0800 Last data filed at 01/01/2023 0600 Gross per 24 hour  Intake 5017.24 ml  Output 3200 ml  Net 1817.24 ml   Filed Weights   12/30/22 0325 12/31/22 0447 01/01/23 0031  Weight: 88.9 kg 89.1 kg 89.4 kg    Examination:  General exam: Overall comfortable, not in distress, mildly agitated, confused HEENT: PERRL Respiratory system:  no wheezes or crackles  Cardiovascular system: Irregularly irregular rhythm.  Gastrointestinal system: Abdomen is nondistended, soft and nontender. Central nervous system: Awake but not oriented Extremities: No edema, no clubbing ,no cyanosis, PICC  line on the right upper extremity Skin: No rashes, no ulcers,no icterus     Data Reviewed: I have personally reviewed following labs and imaging studies  CBC: Recent Labs  Lab 12/25/22 2050 12/26/22 0550 12/27/22 1103 12/30/22 0810  WBC  --  7.5 7.8 5.8  HGB 8.0* 8.6* 10.7* 9.7*  HCT 25.2* 25.6* 32.6* 29.8*  MCV  --  98.5 96.7 100.3*  PLT  --  176 169 151   Basic Metabolic Panel: Recent Labs  Lab 12/25/22 0947 12/26/22 0550 12/28/22 0041 12/30/22 0810  NA 139 135 134* 133*  K 3.9 4.2 4.4 4.2  CL 106 106 106 101  CO2 23 22 21* 26  GLUCOSE 124* 110* 140* 128*  BUN 25* 22 22 23   CREATININE 0.74 0.78 0.83 0.85  CALCIUM 8.7* 8.8* 8.6* 8.2*  MG  --  2.1  --  2.2  PHOS  --  3.9 4.0 2.8     No results found for this or any previous visit (from the past 240 hour(s)).   Radiology Studies: No results found.  Scheduled Meds:  alum & mag hydroxide-simeth  30 mL Oral Once   apixaban  5 mg Oral BID  atorvastatin  20 mg Oral Daily   buPROPion  300 mg Oral Daily   Chlorhexidine Gluconate Cloth  6 each Topical Daily   colestipol  1 g Oral Daily   donepezil  5 mg Oral Daily   Gerhardt's butt cream   Topical QID   haloperidol  5 mg Oral Daily   haloperidol  7.5 mg Oral QHS   melatonin  3 mg Oral q1800   metoprolol tartrate  75 mg Oral BID   pantoprazole (PROTONIX) IV  40 mg Intravenous Q2200   scopolamine  1 patch Transdermal Q72H   sodium chloride flush  10-40 mL Intracatheter Q12H   traZODone  100 mg Oral QHS   Continuous Infusions:  sodium chloride Stopped (12/26/22 1410)   methocarbamol (ROBAXIN) IV 1,000 mg (01/01/23 0628)   promethazine (PHENERGAN) injection (IM or IVPB) 200 mL/hr at 12/31/22 1534   TPN ADULT (ION) 100 mL/hr at 12/31/22 1756     LOS: 17 days   Burnadette Pop, MD [facility] P5/29/2024, 8:00 AM

## 2023-01-01 NOTE — Progress Notes (Signed)
14 Days Post-Op   Subjective/Chief Complaint: No acute change   Objective: Vital signs in last 24 hours: Temp:  [97.5 F (36.4 C)-98.7 F (37.1 C)] 98.1 F (36.7 C) (05/29 0717) Pulse Rate:  [85-100] 100 (05/29 0717) Resp:  [17-22] 20 (05/29 0717) BP: (118-141)/(74-86) 124/86 (05/29 0717) SpO2:  [97 %-99 %] 97 % (05/29 0717) Weight:  [89.4 kg] 89.4 kg (05/29 0031) Last BM Date : 12/31/22  Intake/Output from previous day: 05/28 0701 - 05/29 0700 In: 5017.2 [P.O.:240; I.V.:3287.2; IV Piggyback:1490] Out: 3200 [Urine:3200] Intake/Output this shift: No intake/output data recorded.  Alert, more coherent today Abdomen is soft, nontender, nondistended.  Incision is intact with staples, no evidence of infection  Lab Results:  Recent Labs    12/30/22 0810  WBC 5.8  HGB 9.7*  HCT 29.8*  PLT 151    BMET Recent Labs    12/30/22 0810  NA 133*  K 4.2  CL 101  CO2 26  GLUCOSE 128*  BUN 23  CREATININE 0.85  CALCIUM 8.2*    PT/INR No results for input(s): "LABPROT", "INR" in the last 72 hours. ABG No results for input(s): "PHART", "HCO3" in the last 72 hours.  Invalid input(s): "PCO2", "PO2"  Studies/Results: No results found.  Anti-infectives: Anti-infectives (From admission, onward)    Start     Dose/Rate Route Frequency Ordered Stop   12/19/22 2000  piperacillin-tazobactam (ZOSYN) IVPB 3.375 g        3.375 g 12.5 mL/hr over 240 Minutes Intravenous Every 8 hours 12/19/22 1550 12/23/22 2359   12/16/22 1100  piperacillin-tazobactam (ZOSYN) IVPB 3.375 g  Status:  Discontinued        3.375 g 12.5 mL/hr over 240 Minutes Intravenous Every 8 hours 12/16/22 0954 12/19/22 1550   12/16/22 0400  ampicillin-sulbactam (UNASYN) 1.5 g in sodium chloride 0.9 % 100 mL IVPB  Status:  Discontinued        1.5 g 200 mL/hr over 30 Minutes Intravenous Every 6 hours 12/16/22 0258 12/16/22 0855   12/15/22 1630  piperacillin-tazobactam (ZOSYN) IVPB 3.375 g  Status:  Discontinued         3.375 g 12.5 mL/hr over 240 Minutes Intravenous Every 8 hours 12/15/22 1528 12/16/22 0307   12/15/22 1000  azithromycin (ZITHROMAX) tablet 500 mg  Status:  Discontinued        500 mg Oral Daily 12/15/22 0900 12/16/22 0258   12/15/22 0045  piperacillin-tazobactam (ZOSYN) IVPB 3.375 g        3.375 g 12.5 mL/hr over 240 Minutes Intravenous  Once 12/15/22 0044 12/15/22 0446       Assessment/Plan: s/p Procedure(s): EXPLORATORY LAPAROTOMY, BOWEL RESECTION (N/A) Advance diet. Allow soft food Seems to be stable from surgical standpoint but now suffering with significant delirium Hg stable Continue tpn until sufficient PO intake; calorie count pending Afib per cards POD 14 s/p Exploratory laparotomy, lysis of adhesions, small bowel resection with primary anastomosis for Contained perforation of jejunal diverticulum by Dr. Corliss Skains on 12/18/22 - Path benign - wean TPN as he tolerates more food. Aspiration risk but cleared by speech - Mobilize - PT/OT, sleep hygiene - Pulm toilet, IS, wean o2 as able   Remove staples today. General surgery team will sign off at this point. Please call If any questions or issues.   FEN -soft diet as tolerated, PICC/TPN VTE - SCDs, heparin gtt.  ID - Zosyn Foley - Out POD 1, voiding.     CLL (last chemo in November but on venetoclax)  Recent resp issues - bronchitis, pneumonia, RSV  A. Fib/Bradycardia - pacemaker placement about a month ago   LOS: 17 days    Jonathon Parker 01/01/2023

## 2023-01-01 NOTE — Progress Notes (Signed)
Calorie Count Note - Day 1 Results  48 hour calorie count ordered.  Diet: Dysphagia 3 Supplements: Magic Cup, [employer]  Estimated Nutritional Needs:  Kcal:  2400-2600 Protein:  120-135g  Fluid:  >/=2L  5/28 Lunch: 248 kcal, 17.5 gm protein 5/28 Dinner: 123 kcal, 5.5 gm protein  5/29 Breakfast: 50 kcal, 1 gm protein Supplements: 72.5 kcal, 2 gm protein   Total intake: 493 kcal (21% of minimum estimated needs)  26 protein (22% of minimum estimated needs)  Nutrition Diagnosis: Moderate Malnutrition related to chronic illness (CLL, diverticulitis) as evidenced by moderate fat depletion, moderate muscle depletion, mild muscle depletion, energy intake < or equal to 75% for > or equal to 1 month.   Goal: Patient will meet greater than or equal to 90% of their needs   Intervention:  Snacks TID between meals Carnation Breakfast Essentials TID, each packet mixed with 8 ounces of 2% milk provides 13 grams of protein and 260 calories (vanilla) Magic cup TID with meals, each supplement provides 290 kcal and 9 grams of protein     Kirby Crigler RD, LDN Clinical Dietitian See Loretha Stapler for contact information.

## 2023-01-02 DIAGNOSIS — K529 Noninfective gastroenteritis and colitis, unspecified: Secondary | ICD-10-CM | POA: Diagnosis not present

## 2023-01-02 LAB — CBC
HCT: 30.1 % — ABNORMAL LOW (ref 39.0–52.0)
Hemoglobin: 9.8 g/dL — ABNORMAL LOW (ref 13.0–17.0)
MCH: 31.3 pg (ref 26.0–34.0)
MCHC: 32.6 g/dL (ref 30.0–36.0)
MCV: 96.2 fL (ref 80.0–100.0)
Platelets: 209 10*3/uL (ref 150–400)
RBC: 3.13 MIL/uL — ABNORMAL LOW (ref 4.22–5.81)
RDW: 15.7 % — ABNORMAL HIGH (ref 11.5–15.5)
WBC: 4.8 10*3/uL (ref 4.0–10.5)
nRBC: 0 % (ref 0.0–0.2)

## 2023-01-02 LAB — COMPREHENSIVE METABOLIC PANEL
ALT: 56 U/L — ABNORMAL HIGH (ref 0–44)
AST: 33 U/L (ref 15–41)
Albumin: 2.9 g/dL — ABNORMAL LOW (ref 3.5–5.0)
Alkaline Phosphatase: 274 U/L — ABNORMAL HIGH (ref 38–126)
Anion gap: 8 (ref 5–15)
BUN: 22 mg/dL (ref 8–23)
CO2: 26 mmol/L (ref 22–32)
Calcium: 8.6 mg/dL — ABNORMAL LOW (ref 8.9–10.3)
Chloride: 101 mmol/L (ref 98–111)
Creatinine, Ser: 0.92 mg/dL (ref 0.61–1.24)
GFR, Estimated: >60 mL/min (ref >60)
Glucose, Bld: 114 mg/dL — ABNORMAL HIGH (ref 70–99)
Potassium: 3.9 mmol/L (ref 3.5–5.1)
Sodium: 135 mmol/L (ref 135–145)
Total Bilirubin: 0.8 mg/dL (ref 0.3–1.2)
Total Protein: 5.7 g/dL — ABNORMAL LOW (ref 6.5–8.1)

## 2023-01-02 LAB — PHOSPHORUS: Phosphorus: 3.5 mg/dL (ref 2.5–4.6)

## 2023-01-02 LAB — MAGNESIUM: Magnesium: 2.1 mg/dL (ref 1.7–2.4)

## 2023-01-02 MED ORDER — ADULT MULTIVITAMIN W/MINERALS CH
1.0000 | ORAL_TABLET | Freq: Every day | ORAL | Status: DC
  Administered 2023-01-03: 1 via ORAL
  Filled 2023-01-02: qty 1

## 2023-01-02 MED ORDER — MELATONIN 3 MG PO TABS
3.0000 mg | ORAL_TABLET | Freq: Every day | ORAL | Status: DC
  Administered 2023-01-02: 3 mg via ORAL
  Filled 2023-01-02: qty 1

## 2023-01-02 MED ORDER — POLYETHYLENE GLYCOL 3350 17 G PO PACK
17.0000 g | PACK | Freq: Every day | ORAL | Status: DC
  Administered 2023-01-02 – 2023-01-03 (×2): 17 g via ORAL
  Filled 2023-01-02: qty 1

## 2023-01-02 MED ORDER — MELATONIN 3 MG PO TABS: 3.0000 mg | ORAL_TABLET | Freq: Every day | ORAL | Status: DC

## 2023-01-02 NOTE — Progress Notes (Signed)
PROGRESS NOTE  [facility]  ZOX:[id number] DOB: 26-Feb-1951 DOA: 12/14/2022 PCP: Paulina Fusi, MD   Brief Narrative: Patient is a 72 year old male with history of CLL, paroxysmal A-fib on Eliquis, history of colon cancer status post hemicolectomy more than 10 years ago, GERD, recent enteritis who presented here with complaint of abdominal pain, confusion, watery stools.  Workup in the emergency department showed perforated diverticulitis .  General surgery and  underwent surgical exploration.  Hospital course remarkable for persistent delirium.  General surgery, psychiatry, palliative care were following. His mental status has improved today and he is mostly alert, oriented.  Plan for discharge to SNF  tomorrow if possible  Important events: 5/15.  Postoperative recovery complicated with delirium, A-fib and prolonged ileus. 5/22, with florid delirium features. 5/23, some improvement of symptoms.  Using haloperidol.  Recurrent nausea. 5/26, back to being delirious, occasional nausea.  Restless night.  Did not do well on the Seroquel. 5/27, multiple doses of haloperidol and responded well.  5/30: Mentation has improved, not agitated Assessment & Plan:  Principal Problem:   Enteritis Active Problems:   Sepsis (HCC)   Depression   CLL (chronic lymphocytic leukemia) (HCC)   Malnutrition of moderate degree   Palliative care by specialist  Perforated jejunal diverticulum: Presented with abdominal pain, watery stools.  Status post explorative laparotomy with lysis of adhesions, small bowel resection with primary anastomosis for contained perforation of jejunal diverticulum.  Started on TPN, soft diet.  Hospital course remarkable for prolonged ileus.  Now with return of bowel function but had poor oral intake due to delirium.  Completed antibiotics course.  Diet advanced to regular.  Staples removed. General surgery signed off.  TPN will be stopped today.  Hospital acquired  delirium: History of underlying cognitive dysfunction but usually alert and oriented at home. started becoming more confused after surgery with episodes of intermittent delirium not responding to sedatives.  Psychiatry were following for assistance.  Currently on Haldol, trazodone, bupropion.  TSH, vitamin B12, ammonia level normal. Mental status is improved today.  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  On Eliquis for anticoagulation.  On  metoprolol.  Last echo showed EF of 55 to 60%.  EKG this morning showed stable heart rate  Acute blood loss anemia: Baseline hemoglobin around 12.  Postoperatively, hemoglobin dropped to the range of 7, monitor PRBC transfused.  Currently hemoglobin stable in the range of 10.  Depression/dementia: On Wellbutrin, Aricept.  Home Zoloft at hold  OSA: On CPAP at home but unable to use currently.  Deconditioning/debility: Patient seen by PT and OT and recommended skilled nursing on discharge.  TOC consulted and following  Goals of care:Had prolonged delirium, poor oral intake.  Remains full code.  Palliative care were following.    Nutrition Problem: Moderate Malnutrition Etiology: chronic illness (CLL, diverticulitis)    DVT prophylaxis:Place and maintain sequential compression device Start: 12/16/22 1321 apixaban (ELIQUIS) tablet 5 mg     Code Status: Full Code  Family Communication: Discussed with wife at bedside on 5/30  Patient status:Inpatient  Patient is from :Home  Anticipated discharge to:[facility]F  Estimated DC date:Not sure   Consultants: General surgery, palliative care, psychiatry  Procedures: Exploratory lap with lysis of adhesions  Antimicrobials:  Anti-infectives (From admission, onward)    Start     Dose/Rate Route Frequency Ordered Stop   12/19/22  2000  piperacillin-tazobactam  (ZOSYN) IVPB 3.375 g        3.375 g 12.5 mL/hr over 240 Minutes Intravenous Every 8 hours 12/19/22 1550 12/23/22 2359   12/16/22 1100  piperacillin-tazobactam (ZOSYN) IVPB 3.375 g  Status:  Discontinued        3.375 g 12.5 mL/hr over 240 Minutes Intravenous Every 8 hours 12/16/22 0954 12/19/22 1550   12/16/22 0400  ampicillin-sulbactam (UNASYN) 1.5 g in sodium chloride 0.9 % 100 mL IVPB  Status:  Discontinued        1.5 g 200 mL/hr over 30 Minutes Intravenous Every 6 hours 12/16/22 0258 12/16/22 0855   12/15/22 1630  piperacillin-tazobactam (ZOSYN) IVPB 3.375 g  Status:  Discontinued        3.375 g 12.5 mL/hr over 240 Minutes Intravenous Every 8 hours 12/15/22 1528 12/16/22 0307   12/15/22 1000  azithromycin (ZITHROMAX) tablet 500 mg  Status:  Discontinued        500 mg Oral Daily 12/15/22 0900 12/16/22 0258   12/15/22 0045  piperacillin-tazobactam (ZOSYN) IVPB 3.375 g        3.375 g 12.5 mL/hr over 240 Minutes Intravenous  Once 12/15/22 0044 12/15/22 0446       Subjective: Patient seen and examined at bedside today.  Hemodynamically stable.  He was sitting in the chair.  He looked much more alert and awake.  Alert, nods.  Denies any problems.  Wife at bedside.  He knows  current day and knows that he is in the  hospital  Objective: Vitals:   01/02/23 0019 01/02/23 0353 01/02/23 0604 01/02/23 0723  BP: 126/84 116/87  109/88  Pulse: (!) 105 91  89  Resp: 20 20  (!) 24  Temp: 97.8 F (36.6 C) 98.2 F (36.8 C)  98 F (36.7 C)  TempSrc: Oral Oral  Oral  SpO2: 95% 96%  97%  Weight:   88.3 kg   Height:        Intake/Output Summary (Last 24 hours) at 01/02/2023 1104 Last data filed at 01/02/2023 0542 Gross per 24 hour  Intake 1159.43 ml  Output 2000 ml  Net -840.57 ml   Filed Weights   12/31/22 0447 01/01/23 0031 01/02/23 0604  Weight: 89.1 kg 89.4 kg 88.3 kg    Examination:   General exam: Overall comfortable, not in distress, not agitated, calm and cooperative HEENT:  PERRL Respiratory system:  no wheezes or crackles  Cardiovascular system: Irregularly irregular rhythm Gastrointestinal system: Abdomen is nondistended, soft and nontender. Central nervous system: Alert and mostly oriented Extremities: No edema, no clubbing ,no cyanosis, PICC line on the right upper extremity Skin: No rashes, no ulcers,no icterus     Data Reviewed: I have personally reviewed following labs and imaging studies  CBC: Recent Labs  Lab 12/27/22 1103 12/30/22 0810 01/02/23 0526  WBC 7.8 5.8 4.8  HGB 10.7* 9.7* 9.8*  HCT 32.6* 29.8* 30.1*  MCV 96.7 100.3* 96.2  PLT 169 151 209   Basic Metabolic Panel: Recent Labs  Lab 12/28/22 0041 12/30/22 0810 01/02/23 0526  NA 134* 133* 135  K 4.4 4.2 3.9  CL 106 101 101  CO2 21* 26 26  GLUCOSE 140* 128* 114*  BUN 22 23 22   CREATININE 0.83 0.85 0.92  CALCIUM 8.6* 8.2* 8.6*  MG  --  2.2 2.1  PHOS 4.0 2.8 3.5     No results found for this or any previous visit (from the past 240 hour(s)).   Radiology Studies: No  results found.  Scheduled Meds:  alum & mag hydroxide-simeth  30 mL Oral Once   apixaban  5 mg Oral BID   atorvastatin  20 mg Oral Daily   buPROPion  300 mg Oral Daily   Chlorhexidine Gluconate Cloth  6 each Topical Daily   colestipol  1 g Oral Daily   donepezil  5 mg Oral Daily   Gerhardt's butt cream   Topical QID   haloperidol  5 mg Oral Daily   haloperidol  7.5 mg Oral QHS   melatonin  3 mg Oral Q2000   metoprolol tartrate  75 mg Oral BID   pantoprazole (PROTONIX) IV  40 mg Intravenous Q2200   sodium chloride flush  10-40 mL Intracatheter Q12H   traZODone  100 mg Oral QHS   Continuous Infusions:  sodium chloride Stopped (12/26/22 1410)   methocarbamol (ROBAXIN) IV     TPN ADULT (ION) 50 mL/hr at 01/01/23 1800     LOS: 18 days   Burnadette Pop, MD [facility] P5/30/2024, 11:04 AM

## 2023-01-02 NOTE — Progress Notes (Addendum)
Calorie Count Note- Day 2 results  48 hour calorie count ordered.  Diet: dysphagia 3 Supplements: Magic Cup, [employer]  Estimated Nutritional Needs:  Kcal:  2400-2600 Protein:  120-135g  Fluid:  >/=2L  Lunch: 109 kcal and 5g protein Dinner: 95 kcal and 7g protein Breakfast: 95 kcal and 7g protein Supplements/snacks: 225 kcal and 5g protein  Total intake: 524 kcal (22% of minimum estimated needs)  24 protein (20% of minimum estimated needs)  Nutrition Diagnosis: Moderate Malnutrition related to chronic illness (CLL, diverticulitis) as evidenced by moderate fat depletion, moderate muscle depletion, mild muscle depletion, energy intake < or equal to 75% for > or equal to 1 month.    Goal: Patient will meet greater than or equal to 90% of their needs    Intervention:  Continue to recommend consideration of Cortrak versus PEG tube given malnutrition and ongoing inability to adequately meet nutrition needs via PO intake; Discussed with MD- expect full recovery of delirium post-operatively plan to hold on PEG placement for now Snacks TID between meals Carnation Breakfast Essentials TID, each packet mixed with 8 ounces of 2% milk provides 13 grams of protein and 260 calories (vanilla) Magic cup TID with meals, each supplement provides 290 kcal and 9 grams of protein  Drusilla Kanner, RDN, LDN Clinical Nutrition

## 2023-01-02 NOTE — Progress Notes (Signed)
PHARMACY - TOTAL PARENTERAL NUTRITION CONSULT NOTE  Indication: Small bowel diverticula perforation   Patient Measurements: Height: 6' 0.5" (184.2 cm) Weight: 88.3 kg (194 lb 10.7 oz) IBW/kg (Calculated) : 78.75 TPN AdjBW (KG): 95.3 Body mass index is 26.04 kg/m.  Assessment:  36 YOM presented to [facility] on 5/11 with fevers, abdominal pain and confusion.  PMH significant for CLL, and colon cancer post L hemicolectomy.  Recent history of pacemaker placement for Afib/brady and enteritis.  CT showed small bowel diverticulitis vs enteritis vs SBO with ischemia.  Patient was on a CLD on 5/13 and began having increased abdominal pain. S/p ex-lap with SBR 5/15.  Patient reports eating two meals a day and intake was reduced to ~50% given his abdominal pain that started months ago.  Patient feels that he lost weight but is unsure of how much.  5/29 Pt advanced to dysphagia 3. RD added Carnation Breakfast and magic cup TID to help improve intake. Pt did not like Ensure. Calorie count ongoing but pt seems to only be taking bites or small amounts of most food items. He states that he just isn't hungry. D/w RD and she supports decreasing TPN to 1/2 rate tonight. Pt may need enteral nutrition if unable to meet needs via po route.   5/30 D/c TPN  Central access: PICC placed 12/17/22 TPN start date: 12/18/22  Nutritional Goals: RD Estimated Needs Total Energy Estimated Needs: 2400-2600 Total Protein Estimated Needs: 120-135g Total Fluid Estimated Needs: >/=2L  Current Nutrition:  TPN Dysphagia 3 diet + Carnation breakfast + magic cup - consuming ~10% of meals (last Phenergan use 5/24).  Wife reports barriers to eating more are fear of vomiting and his throat is sore from vomiting previously.  Patient doesn't eat much at baseline.  Delirium also impacting PO intake.  Plan:  TPN to d/c today at 1800 - already at 1/2 rate so don't need to taper further   Christoper Fabian, PharmD, BCPS Please see amion  for complete clinical pharmacist phone list 01/02/2023, 9:01 AM

## 2023-01-02 NOTE — TOC Progression Note (Addendum)
Transition of Care [facility]) - Progression Note    Patient Details  Name: Elisaul Hammermeister MRN: [id number] Date of Birth: 1950-10-11  Transition of Care [facility]) CM/SW Contact  Leander Rams, LCSW Phone Number: 01/02/2023, 9:32 AM  Clinical Narrative:    RN reported in progression that pt's TPN will be discontinued today and the plan is for pt to dc to SNF tomorrow. CSW informed French Ana at [facility] that the plan is for pt to admit to SNF tomorrow. CSW has started insurance auth.   TOC will continue to follow.    Expected Discharge Plan: Skilled Nursing Facility Barriers to Discharge: Continued Medical Work up  Expected Discharge Plan and Services In-house Referral: Clinical Social Work     Living arrangements for the past 2 months: Single Family Home                                       Social Determinants of Health (SDOH) Interventions SDOH Screenings   Food Insecurity: No Food Insecurity (12/15/2022)  Housing: Low Risk  (12/15/2022)  Transportation Needs: No Transportation Needs (12/15/2022)  Utilities: Not At Risk (12/15/2022)  Tobacco Use: Low Risk  (12/19/2022)    Readmission Risk Interventions     No data to display         Oletta Lamas, MSW, LCSWA, LCASA Transitions of Care  Clinical Social Worker I

## 2023-01-02 NOTE — Progress Notes (Signed)
Occupational Therapy Treatment Patient Details Name: Jonathon Parker [facility] MRN: [id number] DOB: 06-12-1951 Today's Date: 01/02/2023   History of present illness 72 y.o. male presented to the ED 12/14/22 with with worsening abdominal pain and confusion. Pt found to have perforated bowel and underwent bowel resection on 5/15. Post op complications include delirium, afib, and prolonged ileus. PMH - HTN, R thalamic CVA, colon CA, CLL, GERD, Depression, enteritis, sepsis, mild cognitive impairment, pacer.   OT comments  Patient making good progress with OT treatment meeting goals for grooming and EOB sitting balance/tolerance. Patient requires supervision for bed mobility and grooming seated at sink. Patient continues to be min assist +2 for transfers and mobility. Patient would benefit from further OT to progress to standing at sink for grooming and toilet transfers. Patient will benefit from continued inpatient follow up therapy, <3 hours/day.   Recommendations for follow up therapy are one component of a multi-disciplinary discharge planning process, led by the attending physician.  Recommendations may be updated based on patient status, additional functional criteria and insurance authorization.    Assistance Recommended at Discharge Frequent or constant Supervision/Assistance  Patient can return home with the following  Two people to help with walking and/or transfers;A lot of help with bathing/dressing/bathroom;Assistance with cooking/housework;Assist for transportation;Assistance with feeding;Help with stairs or ramp for entrance   Equipment Recommendations  Other (comment) (TBD at SNF)    Recommendations for Other Services      Precautions / Restrictions Precautions Precautions: Fall Restrictions Weight Bearing Restrictions: No       Mobility Bed Mobility Overal bed mobility: Needs Assistance Bed Mobility: Supine to Sit     Supine to sit: Supervision     General bed mobility  comments: icreased time and verbal cues    Transfers Overall transfer level: Needs assistance Equipment used: Rolling walker (2 wheels) Transfers: Sit to/from Stand, Bed to chair/wheelchair/BSC Sit to Stand: Min assist, +2 physical assistance           General transfer comment: cues for hand placement and min assist +2 for safety, chair follow, and line management     Balance Overall balance assessment: Needs assistance Sitting-balance support: Bilateral upper extremity supported, Feet supported Sitting balance-Leahy Scale: Fair Sitting balance - Comments: Able to sit EOB without trunk lean Postural control: Posterior lean Standing balance support: Bilateral upper extremity supported, During functional activity Standing balance-Leahy Scale: Poor Standing balance comment: reliant on UE support when standing                           ADL either performed or assessed with clinical judgement   ADL Overall ADL's : Needs assistance/impaired     Grooming: Wash/dry hands;Wash/dry face;Oral care;Supervision/safety;Sitting Grooming Details (indicate cue type and reason): at sink                 Toilet Transfer: Minimal assistance;+2 for physical assistance;Cueing for safety Toilet Transfer Details (indicate cue type and reason): simulated to chair           General ADL Comments: HR in 110s seated 140s standing/mobility    Extremity/Trunk Assessment              Vision       Perception     Praxis      Cognition Arousal/Alertness: Awake/alert, Lethargic Behavior During Therapy: WFL for tasks assessed/performed Overall Cognitive Status: Impaired/Different from baseline Area of Impairment: Attention, Following commands, Safety/judgement, Problem solving, Awareness  Memory: Decreased short-term memory Following Commands: Follows multi-step commands with increased time Safety/Judgement: Decreased awareness of safety,  Decreased awareness of deficits   Problem Solving: Slow processing, Difficulty sequencing, Requires tactile cues, Requires verbal cues General Comments: lethargic initially more alert during session, following commands with increased time        Exercises      Shoulder Instructions       General Comments HR 110's when sitting, increases to 140's when standing and during mobility    Pertinent Vitals/ Pain       Pain Assessment Pain Assessment: Faces Faces Pain Scale: Hurts a little bit Pain Location: abdomen Pain Descriptors / Indicators: Sore Pain Intervention(s): Monitored during session  Home Living                                          Prior Functioning/Environment              Frequency  Min 2X/week        Progress Toward Goals  OT Goals(current goals can now be found in the care plan section)  Progress towards OT goals: Progressing toward goals  Acute Rehab OT Goals Patient Stated Goal: none stated OT Goal Formulation: With patient Time For Goal Achievement: 01/03/23 Potential to Achieve Goals: Good ADL Goals Pt Will Perform Grooming: sitting;with supervision Pt Will Transfer to Toilet: stand pivot transfer;with min assist;bedside commode Additional ADL Goal #1: Pt will tolerate EOB sitting for 5 mins in prep for seated ADLs Additional ADL Goal #3: Pt will demonstrate compliance with use of log rolling for bed mobility and pressure relief withphysical  Min A Additional ADL Goal #4: Pt will complete lateral scooting to/from recliner with Min A  Plan Discharge plan remains appropriate    Co-evaluation    PT/OT/SLP Co-Evaluation/Treatment: Yes Reason for Co-Treatment: Complexity of the patient's impairments (multi-system involvement);To address functional/ADL transfers;For patient/therapist safety   OT goals addressed during session: ADL's and self-care      AM-PAC OT "6 Clicks" Daily Activity     Outcome Measure   Help  from another person eating meals?: None Help from another person taking care of personal grooming?: A Little Help from another person toileting, which includes using toliet, bedpan, or urinal?: A Lot Help from another person bathing (including washing, rinsing, drying)?: A Lot Help from another person to put on and taking off regular upper body clothing?: A Little Help from another person to put on and taking off regular lower body clothing?: A Lot 6 Click Score: 16    End of Session Equipment Utilized During Treatment: Gait belt;Rolling walker (2 wheels)  OT Visit Diagnosis: Muscle weakness (generalized) (M62.81);Pain;Unsteadiness on feet (R26.81);Other symptoms and signs involving cognitive function   Activity Tolerance Patient tolerated treatment well   Patient Left in chair;with call bell/phone within reach;with chair alarm set;with family/visitor present   Nurse Communication Mobility status        Time: 1610-9604 OT Time Calculation (min): 32 min  Charges: OT General Charges $OT Visit: 1 Visit OT Treatments $Self Care/Home Management : 8-22 mins  Alfonse Flavors, OTA Acute Rehabilitation Services  Office [phone]   Dewain Penning 01/02/2023, 9:02 AM

## 2023-01-02 NOTE — Progress Notes (Signed)
Physical Therapy Treatment Patient Details Name: Benigno Moseng [facility] MRN: [id number] DOB: 1951/05/12 Today's Date: 01/02/2023   History of Present Illness 72 y.o. male presented to the ED 12/14/22 with with worsening abdominal pain and confusion. Pt found to have perforated bowel and underwent bowel resection on 5/15. Post op complications include delirium, afib, and prolonged ileus. PMH - HTN, R thalamic CVA, colon CA, CLL, GERD, Depression, enteritis, sepsis, mild cognitive impairment, pacer.    PT Comments    Pt tolerated treatment well today. Co treat with OT. Pt was able to progress ambulation today in hallway with RW +2 Min A and a chair follow. No change in DC/DME recs at this time. Pt will continue to follow.   Recommendations for follow up therapy are one component of a multi-disciplinary discharge planning process, led by the attending physician.  Recommendations may be updated based on patient status, additional functional criteria and insurance authorization.  Follow Up Recommendations  Can patient physically be transported by private vehicle: No    Assistance Recommended at Discharge Frequent or constant Supervision/Assistance  Patient can return home with the following Two people to help with walking and/or transfers;Two people to help with bathing/dressing/bathroom;Direct supervision/assist for financial management;Direct supervision/assist for medications management;Assistance with cooking/housework;Help with stairs or ramp for entrance   Equipment Recommendations  Other (comment) (Per accepting facility)    Recommendations for Other Services       Precautions / Restrictions Precautions Precautions: Fall Restrictions Weight Bearing Restrictions: No     Mobility  Bed Mobility Overal bed mobility: Needs Assistance Bed Mobility: Supine to Sit     Supine to sit: Supervision     General bed mobility comments: icreased time and verbal cues    Transfers Overall  transfer level: Needs assistance Equipment used: Rolling walker (2 wheels) Transfers: Sit to/from Stand, Bed to chair/wheelchair/BSC Sit to Stand: Min assist, +2 physical assistance           General transfer comment: cues for hand placement and min assist +2 for safety, chair follow, and line management    Ambulation/Gait Ambulation/Gait assistance: +2 physical assistance, Min assist, +2 safety/equipment (Chair follow) Gait Distance (Feet): 135 Feet (10+50+75) Assistive device: Rolling walker (2 wheels) Gait Pattern/deviations: Narrow base of support, Trunk flexed, Shuffle, Decreased stride length, Step-through pattern Gait velocity: decr     General Gait Details: Pt much more steady today with no LOB taking short almost shuffling steps. 3 bouts of gait with seated rest breaks.   Stairs             Wheelchair Mobility    Modified Rankin (Stroke Patients Only)       Balance Overall balance assessment: Needs assistance Sitting-balance support: Bilateral upper extremity supported, Feet supported Sitting balance-Leahy Scale: Fair Sitting balance - Comments: Able to sit EOB without trunk lean Postural control: Posterior lean Standing balance support: Bilateral upper extremity supported, During functional activity Standing balance-Leahy Scale: Poor Standing balance comment: reliant on UE support when standing                            Cognition Arousal/Alertness: Awake/alert, Lethargic Behavior During Therapy: WFL for tasks assessed/performed Overall Cognitive Status: Impaired/Different from baseline Area of Impairment: Attention, Following commands, Safety/judgement, Problem solving, Awareness                     Memory: Decreased short-term memory Following Commands: Follows multi-step commands with increased time Safety/Judgement: Decreased  awareness of safety, Decreased awareness of deficits   Problem Solving: Slow processing, Difficulty  sequencing, Requires tactile cues, Requires verbal cues General Comments: lethargic initially more alert during session, following commands with increased time        Exercises      General Comments General comments (skin integrity, edema, etc.): HR up to 147 with ambulation.      Pertinent Vitals/Pain Pain Assessment Pain Assessment: Faces Faces Pain Scale: Hurts a little bit Pain Location: abdomen Pain Descriptors / Indicators: Sore Pain Intervention(s): Monitored during session    Home Living                          Prior Function            PT Goals (current goals can now be found in the care plan section) Progress towards PT goals: Progressing toward goals    Frequency    Min 1X/week      PT Plan Current plan remains appropriate    Co-evaluation PT/OT/SLP Co-Evaluation/Treatment: Yes Reason for Co-Treatment: Complexity of the patient's impairments (multi-system involvement);To address functional/ADL transfers;For patient/therapist safety PT goals addressed during session: Mobility/safety with mobility;Balance OT goals addressed during session: ADL's and self-care      AM-PAC PT "6 Clicks" Mobility   Outcome Measure  Help needed turning from your back to your side while in a flat bed without using bedrails?: A Little Help needed moving from lying on your back to sitting on the side of a flat bed without using bedrails?: A Little Help needed moving to and from a bed to a chair (including a wheelchair)?: A Lot Help needed standing up from a chair using your arms (e.g., wheelchair or bedside chair)?: A Lot Help needed to walk in hospital room?: A Lot Help needed climbing 3-5 steps with a railing? : Total 6 Click Score: 13    End of Session Equipment Utilized During Treatment: Gait belt Activity Tolerance: Patient tolerated treatment well Patient left: in chair;with call bell/phone within reach;with chair alarm set;with family/visitor  present Nurse Communication: Mobility status PT Visit Diagnosis: Other abnormalities of gait and mobility (R26.89);Muscle weakness (generalized) (M62.81)     Time: 4098-1191 PT Time Calculation (min) (ACUTE ONLY): 33 min  Charges:  $Gait Training: 8-22 mins                     Shela Nevin, PT, DPT Acute Rehab Services [phone]    Gladys Damme 01/02/2023, 10:06 AM

## 2023-01-02 NOTE — Progress Notes (Signed)
Mobility Specialist Progress Note:   01/02/23 1150  Mobility  Activity Transferred from chair to bed  Level of Assistance Minimal assist, patient does 75% or more  Assistive Device Front wheel walker  Distance Ambulated (ft) 5 ft  Activity Response Tolerated well  Mobility Referral Yes  $Mobility charge 1 Mobility  Mobility Specialist Start Time (ACUTE ONLY) 1150  Mobility Specialist Stop Time (ACUTE ONLY) 1206  Mobility Specialist Time Calculation (min) (ACUTE ONLY) 16 min   Pt requesting to return to bed. Required minA to stand and pivot to bed. No c/o throughout. Back in bed with all needs met, alarm on.  Addison Lank Mobility Specialist Please contact via SecureChat or  Rehab office at [phone]

## 2023-01-03 DIAGNOSIS — K529 Noninfective gastroenteritis and colitis, unspecified: Secondary | ICD-10-CM | POA: Diagnosis not present

## 2023-01-03 DIAGNOSIS — F4321 Adjustment disorder with depressed mood: Secondary | ICD-10-CM | POA: Diagnosis not present

## 2023-01-03 DIAGNOSIS — E441 Mild protein-calorie malnutrition: Secondary | ICD-10-CM | POA: Diagnosis not present

## 2023-01-03 DIAGNOSIS — D649 Anemia, unspecified: Secondary | ICD-10-CM | POA: Diagnosis not present

## 2023-01-03 DIAGNOSIS — I4891 Unspecified atrial fibrillation: Secondary | ICD-10-CM | POA: Diagnosis not present

## 2023-01-03 DIAGNOSIS — R410 Disorientation, unspecified: Secondary | ICD-10-CM | POA: Diagnosis not present

## 2023-01-03 DIAGNOSIS — I10 Essential (primary) hypertension: Secondary | ICD-10-CM | POA: Diagnosis not present

## 2023-01-03 DIAGNOSIS — I499 Cardiac arrhythmia, unspecified: Secondary | ICD-10-CM | POA: Diagnosis not present

## 2023-01-03 DIAGNOSIS — A419 Sepsis, unspecified organism: Secondary | ICD-10-CM | POA: Diagnosis not present

## 2023-01-03 DIAGNOSIS — R262 Difficulty in walking, not elsewhere classified: Secondary | ICD-10-CM | POA: Diagnosis not present

## 2023-01-03 DIAGNOSIS — J45909 Unspecified asthma, uncomplicated: Secondary | ICD-10-CM | POA: Diagnosis not present

## 2023-01-03 DIAGNOSIS — Z7401 Bed confinement status: Secondary | ICD-10-CM | POA: Diagnosis not present

## 2023-01-03 DIAGNOSIS — K631 Perforation of intestine (nontraumatic): Secondary | ICD-10-CM | POA: Diagnosis not present

## 2023-01-03 DIAGNOSIS — K929 Disease of digestive system, unspecified: Secondary | ICD-10-CM | POA: Diagnosis not present

## 2023-01-03 DIAGNOSIS — F5105 Insomnia due to other mental disorder: Secondary | ICD-10-CM | POA: Diagnosis not present

## 2023-01-03 DIAGNOSIS — F03B0 Unspecified dementia, moderate, without behavioral disturbance, psychotic disturbance, mood disturbance, and anxiety: Secondary | ICD-10-CM | POA: Diagnosis not present

## 2023-01-03 MED ORDER — HALOPERIDOL 5 MG PO TABS
5.0000 mg | ORAL_TABLET | Freq: Every day | ORAL | Status: DC
  Filled 2023-01-03: qty 1

## 2023-01-03 MED ORDER — PANTOPRAZOLE SODIUM 40 MG PO TBEC: 40.0000 mg | DELAYED_RELEASE_TABLET | Freq: Every day | ORAL | Status: DC

## 2023-01-03 MED ORDER — MELATONIN 3 MG PO TABS: 3.0000 mg | ORAL_TABLET | Freq: Every day | ORAL | 0 refills | Status: DC

## 2023-01-03 MED ORDER — HALOPERIDOL 1 MG PO TABS
2.0000 mg | ORAL_TABLET | Freq: Every day | ORAL | Status: DC
  Administered 2023-01-03: 2 mg via ORAL
  Filled 2023-01-03: qty 2

## 2023-01-03 MED ORDER — SENNA 8.6 MG PO TABS: 1.0000 | ORAL_TABLET | Freq: Two times a day (BID) | ORAL | 0 refills | Status: AC

## 2023-01-03 MED ORDER — HALOPERIDOL 2 MG PO TABS: 2.0000 mg | ORAL_TABLET | Freq: Every day | ORAL | 0 refills | Status: DC

## 2023-01-03 MED ORDER — HALOPERIDOL 5 MG PO TABS: 5.0000 mg | ORAL_TABLET | Freq: Every day | ORAL | 0 refills | Status: DC

## 2023-01-03 MED ORDER — POLYETHYLENE GLYCOL 3350 17 G PO PACK: 17.0000 g | PACK | Freq: Every day | ORAL | 0 refills | Status: DC

## 2023-01-03 MED ORDER — METOPROLOL TARTRATE 75 MG PO TABS: 75.0000 mg | ORAL_TABLET | Freq: Two times a day (BID) | ORAL | Status: DC

## 2023-01-03 NOTE — TOC Transition Note (Signed)
Transition of Care [name] - CM/SW Discharge Note   Patient Details  Name: Jonathon Parker MRN: [id number] Date of Birth: 1951/02/07  Transition [facility]) CM/SW Contact:  Deatra Robinson, LCSW Phone Number: 01/03/2023, 10:46 AM   Clinical Narrative: Pt for dc to [facility] today. Auth received and confirmed with French Ana at [facility] they are prepared to admit pt to room 714. Pt's wife at bedside, all questions answered. RN provided with number for report and PTAR arranged for transport. SW signing off at dc.   Dellie Burns, MSW, LCSW [phone] (coverage)        Final next level of care: Skilled Nursing Facility Barriers to Discharge: No Barriers Identified   Patient Goals and CMS Choice CMS Medicare.gov Compare Post Acute Care list provided to:: Patient Choice offered to / list presented to : Patient  Discharge Placement                Patient chooses bed at: [facility] [place] Patient to be transferred to facility by: PT[facility] Name of family member notified: Victoria/wife Patient and family notified of of transfer: 01/03/23  Discharge Plan and Services Additional resources added to the After Visit Summary for   In-house Referral: Clinical Social Work                                   Social Determinants of Health (SDOH) Interventions SDOH Screenings   Food Insecurity: No Food Insecurity (12/15/2022)  Housing: Low Risk  (12/15/2022)  Transportation Needs: No Transportation Needs (12/15/2022)  Utilities: Not At Risk (12/15/2022)  Tobacco Use: Low Risk  (12/19/2022)     Readmission Risk Interventions     No data to display

## 2023-01-03 NOTE — Discharge Summary (Signed)
Physician Discharge Summary  Jonathon Parker [facility] VHQ:[id number] DOB: June 08, 1951 DOA: 12/14/2022  PCP: Paulina Fusi, MD  Admit date: 12/14/2022 Discharge date: 01/03/2023  Admitted From: Home Disposition:  Home  Discharge Condition:Stable CODE STATUS:FULL Diet recommendation: Regular  Brief/Interim Summary: Patient is a 72 year old male with history of CLL, paroxysmal A-fib on Eliquis, history of colon cancer status post hemicolectomy more than 10 years ago, GERD, recent enteritis who presented here with complaint of abdominal pain, confusion, watery stools.  Workup in the emergency department showed perforated diverticulitis .  General surgery and  underwent surgical exploration.  Hospital course remarkable for persistent delirium.  General surgery, psychiatry, palliative care were following. His mental status has improved today and he is mostly alert, oriented.  Plan for discharge to SNF  Following problems were addressed during the hospitalization:  Perforated jejunal diverticulum: Presented with abdominal pain, watery stools.  Status post explorative laparotomy with lysis of adhesions, small bowel resection with primary anastomosis for contained perforation of jejunal diverticulum.  Started on TPN, soft diet.  Hospital course remarkable for prolonged ileus.  Now with return of bowel function but had poor oral intake due to delirium.  Completed antibiotics course.  Diet advanced to regular.  Staples removed. General surgery signed off.  TPN stopped.  He will follow-up with general surgery as an outpatient.   Hospital acquired delirium: History of underlying cognitive dysfunction but usually alert and oriented at home. started becoming more confused after surgery with episodes of intermittent delirium not responding to sedatives.  Psychiatry were following for assistance.  Currently on Haldol for 2 more days.continue trazodone, bupropion.  TSH, vitamin B12, ammonia level normal. Mental  status has  improved today.  He is alert, awake, answers questions, obeys commands and knows what it is today   Paroxysmal A-fib with RVR: History of tachybradycardia syndrome status post permanent pacemaker on 4/24.  On Eliquis for anticoagulation.  On  metoprolol.  Last echo showed EF of 55 to 60%.  EKG this morning showed stable heart rate   Acute blood loss anemia: Baseline hemoglobin around 12.  Postoperatively, hemoglobin dropped to the range of 7, monitor PRBC transfused.  Currently hemoglobin stable in the range of 9-10.   Depression/dementia: On Wellbutrin, Aricept.  Home Zoloft at hold   OSA: On CPAP at home but unable to use currently.   Deconditioning/debility: Patient seen by PT and OT and recommended skilled nursing on discharge.  TOC consulted and following   Goals of care:Had prolonged delirium, poor oral intake.  Remains full code.  Palliative care were following.  Discharge Diagnoses:  Principal Problem:   Enteritis Active Problems:   Sepsis (HCC)   Depression   CLL (chronic lymphocytic leukemia) (HCC)   Malnutrition of moderate degree   Palliative care by specialist    Discharge Instructions  Discharge Instructions     Diet general   Complete by: As directed    Discharge instructions   Complete by: As directed    1)Please take prescribed medications as instructed 2)Follow up with general surgery as an outpatient. Name and number of  the provider has been attached 3)Do a CBC and BMP tests in a week   Increase activity slowly   Complete by: As directed    No wound care   Complete by: As directed       Allergies as of 01/03/2023       Reactions   Lisinopril Cough   Seroquel [quetiapine] Other (See Comments)   Zolpidem Other (  See Comments)   "Got me in outer space"         Medication List     STOP taking these medications    amLODipine 10 MG tablet Commonly known as: NORVASC   aspirin EC 81 MG tablet   diphenoxylate-atropine 2.5-0.025 MG  tablet Commonly known as: LOMOTIL   docusate sodium 100 MG capsule Commonly known as: COLACE   sertraline 25 MG tablet Commonly known as: ZOLOFT   VENETOCLAX PO       TAKE these medications    acetaminophen 500 MG tablet Commonly known as: TYLENOL Take 1,000 mg by mouth every 6 (six) hours as needed for moderate pain or mild pain.   albuterol 108 (90 Base) MCG/ACT inhaler Commonly known as: VENTOLIN HFA Inhale 2 puffs into the lungs every 6 (six) hours as needed for wheezing or shortness of breath.   apixaban 5 MG Tabs tablet Commonly known as: ELIQUIS Take 5 mg by mouth 2 (two) times daily.   atorvastatin 20 MG tablet Commonly known as: LIPITOR Take 20 mg by mouth daily.   buPROPion 300 MG 24 hr tablet Commonly known as: WELLBUTRIN XL Take 1 tablet (300 mg total) by mouth daily.   cholecalciferol 25 MCG (1000 UNIT) tablet Commonly known as: VITAMIN D3 Take 2 tablets (2,000 Units total) by mouth daily. Takes 2 tablets (1,000 units each) for a total of 2,000 units daily What changed: additional instructions   colestipol 1 g tablet Commonly known as: COLESTID Take 1 g by mouth every evening.   donepezil 5 MG tablet Commonly known as: ARICEPT Take 1 tablet (5 mg total) by mouth daily. What changed: when to take this   haloperidol 5 MG tablet Commonly known as: HALDOL Take 1 tablet (5 mg total) by mouth at bedtime for 2 days.   haloperidol 2 MG tablet Commonly known as: HALDOL Take 1 tablet (2 mg total) by mouth daily for 2 days. Start taking on: January 04, 2023   melatonin 3 MG Tabs tablet Take 1 tablet (3 mg total) by mouth daily at 8 pm.   Metoprolol Tartrate 75 MG Tabs Take 1 tablet (75 mg total) by mouth 2 (two) times daily. What changed:  medication strength how much to take   ondansetron 8 MG tablet Commonly known as: ZOFRAN Take 8 mg by mouth every 8 (eight) hours as needed for nausea or vomiting.   pantoprazole 40 MG tablet Commonly known as:  PROTONIX Take 40 mg by mouth daily.   polyethylene glycol 17 g packet Commonly known as: MIRALAX / GLYCOLAX Take 17 g by mouth daily. Start taking on: January 04, 2023   potassium chloride 10 MEQ tablet Commonly known as: KLOR-CON Take 20 mEq by mouth daily.   senna 8.6 MG Tabs tablet Commonly known as: SENOKOT Take 1 tablet (8.6 mg total) by mouth 2 (two) times daily for 7 days.   sucralfate 1 g tablet Commonly known as: CARAFATE Take 1 g by mouth daily as needed (stomach discomfort).   traZODone 100 MG tablet Commonly known as: DESYREL Take 1 tablet (100 mg total) by mouth at bedtime.        Follow-up Information     [name], MD. Go on 01/31/2023.   Specialty: General Surgery Why: 9:40 AM. Please arrive 30 min prior to appointment time to check in. Contact information: [address] [place] [address] [phone]  Allergies  Allergen Reactions   Lisinopril Cough   Seroquel [Quetiapine] Other (See Comments)   Zolpidem Other (See Comments)    "Got me in outer space"     Consultations: Surgery, palliative care   Procedures/Studies: DG Abd 2 Views  Result Date: 12/27/2022 CLINICAL DATA:  Ileus. EXAM: ABDOMEN - 2 VIEW COMPARISON:  12/22/2022 FINDINGS: Bowel gas throughout the abdomen and pelvis with gas in both the small and large bowel. The colonic gaseous distension is decreased from the previous examination. Small bowel gaseous distension is upper limits of normal. Overall, this is a nonobstructive pattern. Postoperative changes in the abdomen and pelvis. No evidence for free air on the left lateral decubitus images. IMPRESSION: Bowel gas in the abdomen and pelvis with a nonobstructive pattern. Cannot exclude an ileus pattern although the colonic distension has decreased since 12/22/2022. Electronically Signed   By: Richarda Overlie M.D.   On: 12/27/2022 14:50   CT HEAD WO CONTRAST ( )  Result Date: 12/25/2022 CLINICAL  DATA:  Provided history: Mental status change, unknown cause. EXAM: CT HEAD WITHOUT CONTRAST TECHNIQUE: Contiguous axial images were obtained from the base of the skull through the vertex without intravenous contrast. RADIATION DOSE REDUCTION: This exam was performed according to the departmental dose-optimization program which includes automated exposure control, adjustment of the mA and/or kV according to patient size and/or use of iterative reconstruction technique. COMPARISON:  Head CT 12/15/2022.  Brain MRI 11/25/2020. FINDINGS: Mildly motion degraded exam. Brain: Generalized cerebral atrophy. 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 Mild chronic small vessel ischemic changes within the cerebral white matter, also better appreciated on the prior MRI. There is no acute intracranial hemorrhage. No demarcated cortical infarct. No extra-axial fluid collection. No evidence of an intracranial mass. No midline shift. Vascular: No hyperdense vessel.  Atherosclerotic calcifications. Skull: No fracture or aggressive osseous lesion. Sinuses/Orbits: No mass or acute finding within the imaged orbits. Mild mucosal thickening within the right maxillary sinus. Minimal mucosal thickening within the left maxillary, bilateral sphenoid and bilateral ethmoid sinuses. IMPRESSION: 1. Mildly motion degraded exam. 2. No evidence of an acute intracranial abnormality. 3. 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 4. Cerebral atrophy and mild cerebral white matter chronic small vessel disease. 5. Mild paranasal sinus mucosal thickening. Electronically Signed   By: Jackey Loge D.O.   On: 12/25/2022 09:53   DG Abd Portable 1V  Result Date: 12/22/2022 CLINICAL DATA:  [id number] Ileus following gastrointestinal surgery (HCC) [id number] EXAM: PORTABLE ABDOMEN - 1 VIEW COMPARISON:  12/19/2022 FINDINGS: The nasogastric tube is been removed.  Stomach is incompletely distended. Midline skin staples. Small bowel relatively decompressed. Normal gastric distribution throughout the colon without dilatation. Surgical clips in the pelvis. No abnormal abdominal calcifications. Regional bones unremarkable. IMPRESSION: Nonobstructive bowel gas pattern. Electronically Signed   By: Corlis Leak M.D.   On: 12/22/2022 11:17   DG CHEST PORT 1 VIEW  Result Date: 12/21/2022 CLINICAL DATA:  Nasogastric tube placement. EXAM: PORTABLE CHEST 1 VIEW COMPARISON:  12/20/2022 FINDINGS: Endotracheal tube has been removed. LEFT-sided transvenous pacemaker leads overlie the RIGHT atrium and RIGHT ventricle. There is stable elevation of the RIGHT hemidiaphragm. Lungs are clear. IMPRESSION: Interval removal of endotracheal tube.  Lungs are clear. Electronically Signed   By: Norva Pavlov M.D.   On: 12/21/2022 09:32   DG Chest Port 1 View  Result Date: 12/20/2022 CLINICAL DATA:  Shortness of breath EXAM:  PORTABLE CHEST 1 VIEW COMPARISON:  12/20/2022, 12/14/2022 FINDINGS: Esophageal tube appears retracted to the level of the upper esophagus. Left-sided cardiac pacing device as before. Elevated right diaphragm. No focal airspace disease or effusion. Right upper extremity central venous catheter tip at the SVC. Stable cardiomediastinal silhouette. IMPRESSION: Esophageal tube appears retracted to the level of the upper esophagus, repositioning is recommended. No acute airspace disease. These results will be called to the ordering clinician or representative by the Radiologist Assistant, and communication documented in the PACS or [employer]. Electronically Signed   By: Jasmine Pang M.D.   On: 12/20/2022 23:36   DG Chest Port 1 View  Result Date: 12/20/2022 CLINICAL DATA:  NG tube placement EXAM: PORTABLE CHEST 1 VIEW COMPARISON:  Abdominal radiograph 12/19/2022 FINDINGS: Enteric tube tip within the stomach with side port in the distal esophagus. Recommend advancement  approximately 4 cm. Left chest wall pacemaker. Low lung volumes. Elevated right hemidiaphragm. IMPRESSION: Enteric tube tip within the stomach with side port in the distal esophagus. Recommend advancement approximately 4 cm. Electronically Signed   By: Minerva Fester M.D.   On: 12/20/2022 18:28   DG Abd Portable 1V  Result Date: 12/19/2022 CLINICAL DATA:  [id number] Encounter for nasogastric (NG) tube placement [id number] EXAM: PORTABLE ABDOMEN - 1 VIEW COMPARISON:  None Available. FINDINGS: Nasogastric tube tip and side port overlie the proximal stomach. Nonobstructive upper abdominal bowel gas pattern. IMPRESSION: Nasogastric tube tip and side port overlie the proximal stomach, advanced from prior, could advance another 4.0 cm to ensure adequate placement. Electronically Signed   By: Caprice Renshaw M.D.   On: 12/19/2022 12:21   DG Abd Portable 1V  Result Date: 12/19/2022 CLINICAL DATA:  Enteric tube placement EXAM: PORTABLE ABDOMEN - 1 VIEW COMPARISON:  Abdominal radiograph dated 12/18/2022 FINDINGS: Similar position of enteric tube with side hole above the GE junction. Midline surgical staples and surgical clip projecting over the right upper quadrant. Surgical chain sutures within the right hemiabdomen. No abnormal bowel dilation. IMPRESSION: Similar position of enteric tube with side hole above the GE junction. Recommend advancement. Electronically Signed   By: Agustin Cree M.D.   On: 12/19/2022 10:35   DG Abd 1 View  Result Date: 12/18/2022 CLINICAL DATA:  Encounter for imaging study to confirm nasogastric tube placement. EXAM: ABDOMEN - 1 VIEW COMPARISON:  CT of the abdomen and pelvis 5/12/4 FINDINGS: The side port of the NG tube is in the distal esophagus at least 5 cm above the GE junction. Recommend advancing at least 10 cm for more optimal positioning. Bowel gas pattern is unremarkable. The lung bases are clear. Skin staples are in place. IMPRESSION: NG tube side port is in the distal esophagus at least 5  cm above the GE junction. Recommend advancing at least 10 cm for more optimal positioning. Electronically Signed   By: Marin Roberts M.D.   On: 12/18/2022 14:05   [place] EKG SITE RITE  Result Date: 12/17/2022 If Site Rite image not attached, placement could not be confirmed due to current cardiac rhythm.  CT Angio Abd/Pel w/ and/or w/o  Result Date: 12/17/2022 CLINICAL DATA:  Chronic mesenteric ischemia EXAM: CTA ABDOMEN AND PELVIS WITHOUT AND WITH CONTRAST TECHNIQUE: Multidetector CT imaging of the abdomen and pelvis was performed using the standard protocol during bolus administration of intravenous contrast. Multiplanar reconstructed images and MIPs were obtained and reviewed to evaluate the vascular anatomy. RADIATION DOSE REDUCTION: This exam was performed according to the departmental dose-optimization program which includes  automated exposure control, adjustment of the mA and/or kV according to patient size and/or use of iterative reconstruction technique. CONTRAST:  OMNIPAQUE IOHEXOL 350 MG/ML SOLN COMPARISON:  CT scan of the abdomen and pelvis 12/15/2022 FINDINGS: VASCULAR Aorta: Normal caliber aorta without aneurysm, dissection, vasculitis or significant stenosis. Celiac: Patent without evidence of aneurysm, dissection, vasculitis or significant stenosis. SMA: Patent without evidence of aneurysm, dissection, vasculitis or significant stenosis. Renals: Both renal arteries are patent without evidence of aneurysm, dissection, vasculitis, fibromuscular dysplasia or significant stenosis. IMA: Patent without evidence of aneurysm, dissection, vasculitis or significant stenosis. Inflow: Patent without evidence of aneurysm, dissection, vasculitis or significant stenosis. Proximal Outflow: Bilateral common femoral and visualized portions of the superficial and profunda femoral arteries are patent without evidence of aneurysm, dissection, vasculitis or significant stenosis. Veins: The portal and  mesenteric veins are widely patent. No focal venous abnormality. Review of the MIP images confirms the above findings. NON-VASCULAR Lower chest: Trace bilateral pleural effusions and associated atelectasis. Partially imaged cardiac rhythm maintenance device. The heart is normal in size. Hepatobiliary: The gallbladder is surgically absent. Trace air is present within the common hepatic duct and the left intrahepatic ducts suggesting patency of the biliary sphincter. No discrete hepatic lesion. Pancreas: Unremarkable. No pancreatic ductal dilatation or surrounding inflammatory changes. Spleen: No splenic injury or perisplenic hematoma. Adrenals/Urinary Tract: The adrenal glands are normal. No hydronephrosis, nephrolithiasis or enhancing renal mass. The ureters and bladder are unremarkable. Stomach/Bowel: Progressive inflammatory changes in the right upper quadrant now with multiple locules of extraperitoneal air. Multiple small bowel diverticula are also present in the region. Correlation with prior imaging from November of 2023 confirms multifocal small bowel diverticula in this region. The imaging appearance is most consistent with perforated diverticulitis of the small bowel. Lymphatic: No suspicious lymphadenopathy on today's exam. Reproductive: Prostate is unremarkable. Other: Surgical changes of prior bilateral laparoscopic ventral hernia repair. No discrete abscess or free fluid. Musculoskeletal: Advanced L5-S1 degenerative disc disease. IMPRESSION: VASCULAR 1. No evidence of visceral artery stenosis or occlusion. There is excellent arterial supply to the affected bowel in the right upper quadrant. By imaging, ischemia is not a consideration. 2. No evidence of venous obstruction or thrombosis. 3. Mild scattered atherosclerotic plaque. Aortic Atherosclerosis (ICD10-I70.0). NON-VASCULAR 1. Interval progression of the inflammatory process affecting several loops of small bowel in the right upper quadrant now with  multiple locules of extraluminal gas consistent with contained perforation. Correlation with prior imaging demonstrates multiple small bowel diverticula in the same region. Overall, the imaging appearance is most consistent with perforated diverticulitis of the small bowel in the right upper quadrant. 2. No abscess formation or free fluid at this time. 3. Additional ancillary findings as above. Electronically Signed   By: Malachy Moan M.D.   On: 12/17/2022 06:20   CT ABDOMEN PELVIS W CONTRAST  Addendum Date: 12/15/2022   ADDENDUM REPORT: 12/15/2022 00:23 ADDENDUM: These results were called by telephone at the time of interpretation on 12/15/2022 at 12:23 am to provider Dr. Judd Lien, who verbally acknowledged these results. Electronically Signed   By: Tish Frederickson M.D.   On: 12/15/2022 00:23   Result Date: 12/15/2022 CLINICAL DATA:  Abdominal pain, acute, nonlocalized EXAM: CT ABDOMEN AND PELVIS WITH CONTRAST TECHNIQUE: Multidetector CT imaging of the abdomen and pelvis was performed using the standard protocol following bolus administration of intravenous contrast. RADIATION DOSE REDUCTION: This exam was performed according to the departmental dose-optimization program which includes automated exposure control, adjustment of the mA and/or kV according  to patient size and/or use of iterative reconstruction technique. CONTRAST:  85mL OMNIPAQUE IOHEXOL 300 MG/ML  SOLN COMPARISON:  CT abdomen pelvis 05/13/2019. FINDINGS: Lower chest: Cardiac leads partially visualized. Hepatobiliary: No focal liver abnormality. Status post cholecystectomy. No biliary dilatation. Pancreas: Diffusely atrophic. No focal lesion. Otherwise normal pancreatic contour. No surrounding inflammatory changes. No main pancreatic ductal dilatation. Spleen: Normal in size without focal abnormality. Adrenals/Urinary Tract: No adrenal nodule bilaterally. Bilateral kidneys enhance symmetrically. No hydronephrosis. No hydroureter. The urinary  bladder is unremarkable. On delayed imaging, there is no urothelial wall thickening and there are no filling defects in the opacified portions of the bilateral collecting systems or ureters. Stomach/Bowel: Sigmoid resection and appendectomy. Stomach is within normal limits. No evidence of large bowel wall thickening or dilatation. Multiple loops of adjacent small bowel within the right mid abdomen demonstrating bowel wall thickening, hazy bowel wall, and marked mesenteric edema. No definite transition point identified. No pneumatosis. The small bowel it is dilated up to 4 cm. Vascular/Lymphatic: No abdominal aorta or iliac aneurysm. Mild atherosclerotic plaque of the aorta and its branches. No abdominal, pelvic, or inguinal lymphadenopathy. Reproductive: Prostate is unremarkable. Other: No intraperitoneal free fluid. No intraperitoneal free gas. No organized fluid collection. Musculoskeletal: No abdominal wall hernia or abnormality. No suspicious lytic or blastic osseous lesions. No acute displaced fracture. Intervertebral disc space narrowing at the L5-S1 level with associated grade 1 anterolisthesis of L5 on S1. IMPRESSION: 1. Marked inflammatory changes of several loops of right mid abdominal small bowel and mesentery. Differential diagnosis includes small bowel diverticulitis versus enteritis versus developing small bowel obstruction with ischemia. Contained perforation is not excluded. No definite free intraperitoneal gas. 2.  Aortic Atherosclerosis (ICD10-I70.0). Electronically Signed: By: Tish Frederickson M.D. On: 12/15/2022 00:14   CT Head Wo Contrast  Result Date: 12/15/2022 CLINICAL DATA:  Delirium EXAM: CT HEAD WITHOUT CONTRAST TECHNIQUE: Contiguous axial images were obtained from the base of the skull through the vertex without intravenous contrast. RADIATION DOSE REDUCTION: This exam was performed according to the departmental dose-optimization program which includes automated exposure control,  adjustment of the mA and/or kV according to patient size and/or use of iterative reconstruction technique. COMPARISON:  CT head 06/20/2022 FINDINGS: Brain: Cerebral ventricle sizes are concordant with the degree of cerebral volume loss. Patchy and confluent areas of decreased attenuation are noted throughout the deep and periventricular white matter of the cerebral hemispheres bilaterally, compatible with chronic microvascular ischemic disease. No evidence of large-territorial acute infarction. No parenchymal hemorrhage. No mass lesion. No extra-axial collection. No mass effect or midline shift. No hydrocephalus. Basilar cisterns are patent. Vascular: No hyperdense vessel. Skull: No acute fracture or focal lesion. Sinuses/Orbits: Right maxillary sinus mucosal thickening. Paranasal sinuses and mastoid air cells are clear. The orbits are unremarkable. Other: None. IMPRESSION: No acute intracranial abnormality. Electronically Signed   By: Tish Frederickson M.D.   On: 12/15/2022 00:16   DG Chest Portable 1 View  Result Date: 12/14/2022 CLINICAL DATA:  Fevers EXAM: PORTABLE CHEST 1 VIEW COMPARISON:  09/07/2022 FINDINGS: Cardiac shadow is stable. Pacing device is now seen in satisfactory position. Lungs are clear. No bony abnormality is noted. IMPRESSION: No acute abnormality noted. Electronically Signed   By: Alcide Clever M.D.   On: 12/14/2022 22:36      Subjective: Patient seen and examined at bedside today.  Hemodynamically stable.  Lying in bed.  He looks calm and comfortable.  Not agitated like few days ago.  Wife at bedside.  Long discussion  held with the wife about discharge planning.  We discussed about his poor oral intake.  Patient and wife do not agree for any kind of enteral feeding .  They expect improvement in the oral intake in coming days.  Medically stable for discharge today to SNF  Discharge Exam: Vitals:   01/03/23 0730 01/03/23 0731  BP:  124/80  Pulse: 67 67  Resp:  (!) 25  Temp:  98.1  F (36.7 C)  SpO2: 98% 98%   Vitals:   01/02/23 2354 01/03/23 0521 01/03/23 0730 01/03/23 0731  BP: 118/78   124/80  Pulse: 95  67 67  Resp: 20 20  (!) 25  Temp: 98.2 F (36.8 C) (!) 97.4 F (36.3 C)  98.1 F (36.7 C)  TempSrc: Oral Axillary  Oral  SpO2: 97%  98% 98%  Weight:  87.7 kg    Height:        General: Pt is alert, awake, not in acute distress Cardiovascular: RRR, S1/S2 +, no rubs, no gallops Respiratory: CTA bilaterally, no wheezing, no rhonchi Abdominal: Soft, NT, ND, bowel sounds + Extremities: no edema, no cyanosis    The results of significant diagnostics from this hospitalization (including imaging, microbiology, ancillary and laboratory) are listed below for reference.     Microbiology: No results found for this or any previous visit (from the past 240 hour(s)).   Labs: BNP (last 3 results) Recent Labs    12/20/22 2300  BNP 336.7*   Basic Metabolic Panel: Recent Labs  Lab 12/28/22 0041 12/30/22 0810 01/02/23 0526  NA 134* 133* 135  K 4.4 4.2 3.9  CL 106 101 101  CO2 21* 26 26  GLUCOSE 140* 128* 114*  BUN 22 23 22   CREATININE 0.83 0.85 0.92  CALCIUM 8.6* 8.2* 8.6*  MG  --  2.2 2.1  PHOS 4.0 2.8 3.5   Liver Function Tests: Recent Labs  Lab 12/30/22 0810 01/02/23 0526  AST 28 33  ALT 48* 56*  ALKPHOS 276* 274*  BILITOT 0.7 0.8  PROT 5.4* 5.7*  ALBUMIN 2.7* 2.9*   No results for input(s): "LIPASE", "AMYLASE" in the last 168 hours. Recent Labs  Lab 12/29/22 2119  AMMONIA 35   CBC: Recent Labs  Lab 12/27/22 1103 12/30/22 0810 01/02/23 0526  WBC 7.8 5.8 4.8  HGB 10.7* 9.7* 9.8*  HCT 32.6* 29.8* 30.1*  MCV 96.7 100.3* 96.2  PLT 169 151 209   Cardiac Enzymes: No results for input(s): "CKTOTAL", "CKMB", "CKMBINDEX", "TROPONINI" in the last 168 hours. BNP: Invalid input(s): "POCBNP" CBG: No results for input(s): "GLUCAP" in the last 168 hours. D-Dimer No results for input(s): "DDIMER" in the last 72 hours. Hgb A1c No  results for input(s): "HGBA1C" in the last 72 hours. Lipid Profile No results for input(s): "CHOL", "HDL", "LDLCALC", "TRIG", "CHOLHDL", "LDLDIRECT" in the last 72 hours. Thyroid function studies No results for input(s): "TSH", "T4TOTAL", "T3FREE", "THYROIDAB" in the last 72 hours.  Invalid input(s): "FREET3" Anemia work up No results for input(s): "VITAMINB12", "FOLATE", "FERRITIN", "TIBC", "IRON", "RETICCTPCT" in the last 72 hours. Urinalysis    Component Value Date/Time   COLORURINE YELLOW 12/15/2022 0030   APPEARANCEUR CLEAR 12/15/2022 0030   LABSPEC >1.046 (H) 12/15/2022 0030   PHURINE 8.5 (H) 12/15/2022 0030   GLUCOSEU NEGATIVE 12/15/2022 0030   HGBUR NEGATIVE 12/15/2022 0030   BILIRUBINUR NEGATIVE 12/15/2022 0030   KETONESUR NEGATIVE 12/15/2022 0030   PROTEINUR TRACE (A) 12/15/2022 0030   NITRITE NEGATIVE 12/15/2022 0030  LEUKOCYTESUR NEGATIVE 12/15/2022 0030   Sepsis Labs Recent Labs  Lab 12/27/22 1103 12/30/22 0810 01/02/23 0526  WBC 7.8 5.8 4.8   Microbiology No results found for this or any previous visit (from the past 240 hour(s)).  Please note: You were cared for by a hospitalist during your hospital stay. Once you are discharged, your primary care physician will handle any further medical issues. Please note that NO REFILLS for any discharge medications will be authorized once you are discharged, as it is imperative that you return to your primary care physician (or establish a relationship with a primary care physician if you do not have one) for your post hospital discharge needs so that they can reassess your need for medications and monitor your lab values.    Time coordinating discharge: 40 minutes  SIGNED:   Burnadette Pop, MD  [facility] 01/03/2023, 10:45 AM Pager [phone]  If 7PM-7AM, please contact night-coverage [email or web address] Password [email or web address]

## 2023-01-03 NOTE — Care Management Important Message (Signed)
Important Message  Patient Details  Name: Jonathon Parker [facility] MRN: [id number] Date of Birth: 1951-05-17   Medicare Important Message Given:  Yes     Renie Ora 01/03/2023, 10:59 AM

## 2023-01-03 NOTE — Consult Note (Addendum)
[facility] Psychiatry New Face-to-Face Psychiatric Evaluation   Service Date: Jan 03, 2023 LOS:  LOS: 19 days    Assessment  Jonathon Parker is a 71 y.o. male admitted medically for 12/14/2022  9:35 PM for perfed diverticulitis. He carries the psychiatric diagnoses of depression and has a past medical history listed below. Psychiatry was consulted for prolonged delirium refractory to initial measures by Dr. Jerral Ralph.    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 Valentino Hue in [facility] hospital     Depression    GERD (gastroesophageal reflux disease)    controlled with nexium    Hypertension    Pacemaker     At this time, the patient's presentation is most consistent with  delirium, most likely due to multiple etiologies including but not limited to infection, medications, pain, altered sleep/wake cycle, and limited mobility. During this time period, minimization of delirogenic insults will be of utmost importance; this includes promoting the normal circadian cycle, minimizing lines/tubes, avoiding deliriogenic medications such as benzodiazepines and anticholinergic medications, and frequently reorienting the patient. Symptomatic treatment for agitation can be provided by antipsychotic medications, though it is important to remember that these do not treat the underlying etiology of delirium. Notably, there can be a time lag effect between treatment of a medical problem and resolution of delirium. This time lag effect may be of longer duration in the elderly, and those with underlying cognitive impairment or brain injury.   5/31: Patient has responded positively to melatonin and is sleeping better with less episodes of wakefulness.  Wife reports that he requires repositioning at night to help him breathe better and reduce his GI distress.  He  continues to occasionally pick things off the air and remains delirious but the episodes are less often. No side effects on medications and doing well on Wellbutrin, Melatonin, Aricept and Haldol as prescribed.  May consider taper of the Haldol over the weekend.  Taper has been initiated for the next 2 days.  Since the patient is going to be transferred to SNF, we will sign off.  Diagnoses:  Active Hospital problems: Principal Problem:   Enteritis Active Problems:   Sepsis (HCC)   Depression   CLL (chronic lymphocytic leukemia) (HCC)   Malnutrition of moderate degree   Palliative care by specialist     Plan  ## Safety and Observation Level:  - Based on my clinical evaluation, I estimate the patient to be at low risk of self harm in the current setting - At this time, we recommend a routine level of observation. This decision is based on my review of the chart including patient's history and current presentation, interview of the patient, mental status examination, and consideration of suicide risk including evaluating suicidal ideation, plan, intent, suicidal or self-harm behaviors, risk factors, and protective factors. This judgment is based on our ability to directly address suicide risk, implement suicide prevention strategies and develop a safety plan while the patient is in the clinical setting. Please contact our team if there is a concern that risk level has changed.   ## Medications:  -- c bupropion 300 mg PO qAM and trazodone 100 mg PO qPM --Continue to hold zoloft at this point -- s melatonin 3 mg q1800 -- c current haloperidol (5 mg  qAM, 7.5 mg qPM) until pt has 2 consecutive good days, then can taper.  Patient seems to be improving in his agitation, Haldol is going to be tapered to 2 mg in the morning and 5 mg at night for 2 days then discontinue.  He will continue on the as needed for agitation.  ## Medical Decision Making Capacity:  Not formally assessed  ## Further  Work-up:  -- none currently   Appreciate thorough w/u by primary team   -- most recent EKG on 5/30 had QTcB of 466 w/ HR 101 -- Pertinent labwork reviewed earlier this admission includes: TSH WNL, B12 wnl, low albumin (3.0 4d ago). Ammonia also normal. No elevated Cr.  CT head 5/22 NAICA but with atrophy Glucose 114, Alt high at 56,and total protein 5.7  ## Disposition:  -- per primary provider.  Thank you for this consult request. Recommendations have been communicated to the primary team.  The patient is going to be transferred to SNF.  We will sign off at this time.  Rex Kras, MD   New history  Relevant Aspects of Hospital Course:  Admitted on 12/14/2022 for what was found to be perfed diverticulitis.Has been persistently delirious since surgery with partial response to haloperidol. Has failed to respond to quetiapine (seemed to make things much worse).   Patient Report:  The patient was seen on 01/01/2022 and 01/02/2022.  On both occasions the wife was present with the patient.  Yesterday, according to the wife he had a good day.  He actually walked all the way to the nurses station with support.  This morning patient reports sleeping well with melatonin.  Wife indicates that he has been mildly agitated especially when the nurses tried to reposition him but is always in the context of him being disturbed.  He is also having some spontaneous episodes of confusion when he starts picking stuff out of the air.  However this is not as bad as it was in the past.  Overall he seems to be improving.  He is oriented to situation place but reports that he refers to the wall calendar for the day and date.  No active SI/HI/AVH noted.  No irritability noted during my evaluation.  Patient's wife is fairly comfortable with the progress although he still has episodes when he is not yet at baseline. 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  He used to enjoy  gardening which he misses.  Over the last 2 years he has been in decline in his physical health and his wife also notes that he was at one point diagnosed as having MCI (mild cognitive impairment).  Although a formal cognitive exam is not feasible at this time, patient is improving with less episodes of confusion and less episodes of agitation. During interview patient interacted appropriately with minimal irritability and confusion.  He depended heavily on his wife for most of the responses and for interpretation. ROS:  No SI, HI, AH/VH currently   Collateral information:  The interview was conducted with his wife at the bedside. As noted earlier patient was diagnosed as having MCI and possibly depression.  She notes that he has been pretty much sitting on the couch and watching TV over the last year or so.  Psychiatric History:  Information collected from pt, wife On bupropion 300 at home Sertraline 25 started 3/4 (seems like new script, confirmed w/ wife) Trazodone 100 is home med  No hx suicide  attempts or hospitalizations.   Social History:  Lives with wife Kids + 3 granddaughters Spiritual not religious Retired Runner, broadcasting/film/video  Family History:  The patient's family history includes Prostate cancer in his brother.  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 Valentino Hue in [facility] hospital     Depression    GERD (gastroesophageal reflux disease)    controlled with nexium    Hypertension    Pacemaker     Surgical History: Past Surgical History:  Procedure Laterality Date   APPENDECTOMY     CARPAL TUNNEL RELEASE     left    CHOLECYSTECTOMY N/A 04/20/2019   Procedure: LAPAROSCOPIC CHOLECYSTECTOMY;  Surgeon: [name], MD;  Location: [facility] ORS;  Service: General;  Laterality: N/A;   COLON SURGERY  2002   10 inches of colon  taken out    COLONOSCOPY     ENDOSCOPIC RETROGRADE CHOLANGIOPANCREATOGRAPHY (ERCP) WITH PROPOFOL N/A 03/01/2019   Procedure: ENDOSCOPIC RETROGRADE CHOLANGIOPANCREATOGRAPHY (ERCP) WITH PROPOFOL;  Surgeon: [name]., MD;  Location: [facility] ENDOSCOPY;  Service: Gastroenterology;  Laterality: N/A;   ERCP  03/01/2019   HERNIA REPAIR     bilateral inguinal    IR CHOLANGIOGRAM EXISTING TUBE  03/25/2019   IR PERC CHOLECYSTOSTOMY  03/02/2019   LAPAROTOMY N/A 12/18/2022   Procedure: EXPLORATORY LAPAROTOMY, BOWEL RESECTION;  Surgeon: [name], MD;  Location: [facility] OR;  Service: General;  Laterality: N/A;   left heel reconstruction  01/2002   17 screws and 2 plates    PACEMAKER INSERTION     POLYPECTOMY     REMOVAL OF STONES  03/01/2019   Procedure: REMOVAL OF STONES;  Surgeon: [name]., MD;  Location: [facility] ENDOSCOPY;  Service: Gastroenterology;;   [name]  03/01/2019   Procedure: [name];  Surgeon: [name]., MD;  Location: [facility] ENDOSCOPY;  Service: Gastroenterology;;   tooth extracted     with laughing gas    UPPER GASTROINTESTINAL ENDOSCOPY      Medications:   Current Facility-Administered Medications:    0.9 %  sodium chloride infusion, , Intravenous, PRN, Amin, Loura Halt, MD, Stopped at 12/26/22 1410   albuterol (PROVENTIL) (2.5 MG/3ML) 0.083% nebulizer solution 2.5 mL, 2.5 mL, Inhalation, Q6H PRN, Kabrich, Francena Hanly, PA-C   alum & mag hydroxide-simeth (MAALOX/MYLANTA) 200-200-20 MG/5ML suspension 30 mL, 30 mL, Oral, Once, Carollee Herter, DO   apixaban Everlene Balls) tablet 5 mg, 5 mg, Oral, BID, Dorcas Carrow, MD, 5 mg at 01/02/23 2124   atorvastatin (LIPITOR) tablet 20 mg, 20 mg, Oral, Daily, Eric Form, PA-C, 20 mg at 01/02/23 1019   buPROPion (WELLBUTRIN XL) 24 hr tablet 300 mg, 300 mg, Oral, Daily, Eric Form, PA-C, 300 mg at 01/02/23 1019   Chlorhexidine Gluconate Cloth 2 % PADS 6 each, 6 each, Topical, Daily, Eric Form, PA-C, 6 each at 01/02/23 0900   colestipol (COLESTID) tablet 1 g, 1 g, Oral, Daily, Kabrich, Martha H, PA-C, 1 g at 01/02/23 1020   donepezil (ARICEPT) tablet 5 mg, 5 mg, Oral, Daily, Eric Form, PA-C, 5 mg at 01/02/23 1019   Gerhardt's butt cream, , Topical, QID, Dorcas Carrow, MD, Given at 01/02/23 2201   guaiFENesin (ROBITUSSIN) 100 MG/5ML liquid 5 mL, 5 mL, Oral, Q6H PRN, Burnadette Pop, MD, 5 mL  at 01/02/23 2126   haloperidol (HALDOL) tablet 5 mg, 5 mg, Oral, Daily, Dorcas Carrow, MD, 5 mg at 01/02/23 1019   haloperidol (HALDOL) tablet 7.5 mg, 7.5 mg, Oral, QHS, Ghimire, Kuber, MD, 7.5 mg at 01/02/23 2124   haloperidol lactate (HALDOL) injection 2.5 mg, 2.5 mg, Intravenous, Q4H PRN, Dorcas Carrow, MD, 2.5 mg at 12/31/22 0454   melatonin tablet 3 mg, 3 mg, Oral, Q2000, Adhikari, Amrit, MD, 3 mg at 01/02/23 1858   menthol-cetylpyridinium (CEPACOL) lozenge 3 mg, 1 lozenge, Oral, PRN, Renford Dills, Amrit, MD   methocarbamol (ROBAXIN) 500 mg in dextrose 5 % 50 mL IVPB, 500 mg, Intravenous, Q8H PRN, Renford Dills, Amrit, MD   metoprolol tartrate (LOPRESSOR) injection 5 mg, 5 mg, Intravenous, Q2H PRN, Amin, Ankit Chirag, MD, 5 mg at 12/29/22 1343   metoprolol tartrate (LOPRESSOR) tablet 75 mg, 75 mg, Oral, BID, Laverda Page B, NP, 75 mg at 01/02/23 2124   multivitamin with minerals tablet 1 tablet, 1 tablet, Oral, Daily, Adhikari, Amrit, MD   ondansetron (ZOFRAN) tablet 4 mg, 4 mg, Oral, Q6H PRN, 4 mg at 12/27/22 1126 **OR** ondansetron (ZOFRAN) injection 4 mg, 4 mg, Intravenous, Q6H PRN, Eric Form, PA-C, 4 mg at 12/29/22 1143   pantoprazole (PROTONIX) injection 40 mg, 40 mg, Intravenous, Q2200, Carollee Herter, DO, 40 mg at 01/02/23 2116   phenol (CHLORASEPTIC) mouth spray 1 spray, 1 spray, Mouth/Throat, PRN, Dorcas Carrow, MD, 1 spray at 01/02/23 0133   polyethylene glycol (MIRALAX / GLYCOLAX) packet 17 g, 17 g, Oral, Daily, Adhikari, Amrit, MD, 17 g at 01/02/23 1208   sodium chloride  flush (NS) 0.9 % injection 10-40 mL, 10-40 mL, Intracatheter, Q12H, Eric Form, PA-C, 10 mL at 01/02/23 2116   sodium chloride flush (NS) 0.9 % injection 10-40 mL, 10-40 mL, Intracatheter, PRN, Eric Form, PA-C, 10 mL at 12/25/22 1742   traMADol (ULTRAM) tablet 50 mg, 50 mg, Oral, Q6H PRN, Chevis Pretty III, MD, 50 mg at 12/29/22 1411   traZODone (DESYREL) tablet 100 mg, 100 mg, Oral, QHS, Ghimire, Lyndel Safe, MD, 100 mg at 01/02/23 2124  Allergies: Allergies  Allergen Reactions   Lisinopril Cough   Seroquel [Quetiapine] Other (See Comments)   Zolpidem Other (See Comments)    "Got me in outer space"        Objective  Vital signs:  Temp:  [97.4 F (36.3 C)-98.2 F (36.8 C)] 98.1 F (36.7 C) (05/31 0731) Pulse Rate:  [67-106] 67 (05/31 0731) Resp:  [20-25] 25 (05/31 0731) BP: (117-130)/(78-91) 124/80 (05/31 0731) SpO2:  [95 %-100 %] 98 % (05/31 0731) Weight:  [87.7 kg] 87.7 kg (05/31 0521)  Psychiatric Specialty Exam:  Presentation  General Appearance: Disheveled; Casual  Eye Contact:Fair  Speech:Slow  Speech Volume:Decreased  Handedness:Right   Mood and Affect  Mood:Anxious  Affect:Restricted   Thought Process  Thought Processes:Coherent  Descriptions of Associations:Intact  Orientation:Partial  Thought Content:Perseveration  History of Schizophrenia/Schizoaffective disorder:No data recorded Duration of Psychotic Symptoms:No data recorded Hallucinations:Hallucinations: None  Ideas of Reference:None  Suicidal Thoughts:Suicidal Thoughts: No  Homicidal Thoughts:Homicidal Thoughts: No   Sensorium  Memory:Immediate Fair; Recent Poor; Remote Fair  Judgment:Fair  Insight:Fair   Executive Functions  Concentration:Fair  Attention Span:Fair  Recall:Fair  Fund of Knowledge:Fair  Language:Fair   Psychomotor Activity  Psychomotor Activity:Psychomotor Activity: Decreased   Assets  Assets:Social Support; Desire for  Improvement   Sleep  Sleep:Sleep: Good    Physical Exam: Physical Exam HENT:     Head: Normocephalic.  Pulmonary:  Effort: Pulmonary effort is normal.  Neurological:     General: No focal deficit present.     Mental Status: He is alert and oriented to person, place, and time.     Comments: Tremor x 4/4 limbs, muscle twitching at baseline No areas of dystonia on exam    No real change in physical exam  Blood pressure 124/80, pulse 67, temperature 98.1 F (36.7 C), temperature source Oral, resp. rate (!) 25, height 6' 0.5" (1.842 m), weight 87.7 kg, SpO2 98 %. Body mass index is 25.86 kg/m.

## 2023-01-06 DIAGNOSIS — E441 Mild protein-calorie malnutrition: Secondary | ICD-10-CM | POA: Diagnosis not present

## 2023-01-06 DIAGNOSIS — K631 Perforation of intestine (nontraumatic): Secondary | ICD-10-CM | POA: Diagnosis not present

## 2023-01-06 DIAGNOSIS — I4891 Unspecified atrial fibrillation: Secondary | ICD-10-CM | POA: Diagnosis not present

## 2023-01-06 DIAGNOSIS — R262 Difficulty in walking, not elsewhere classified: Secondary | ICD-10-CM | POA: Diagnosis not present

## 2023-01-22 DIAGNOSIS — Z48815 Encounter for surgical aftercare following surgery on the digestive system: Secondary | ICD-10-CM | POA: Diagnosis not present

## 2023-01-22 DIAGNOSIS — F331 Major depressive disorder, recurrent, moderate: Secondary | ICD-10-CM | POA: Diagnosis not present

## 2023-01-22 DIAGNOSIS — N183 Chronic kidney disease, stage 3 unspecified: Secondary | ICD-10-CM | POA: Diagnosis not present

## 2023-01-22 DIAGNOSIS — G4733 Obstructive sleep apnea (adult) (pediatric): Secondary | ICD-10-CM | POA: Diagnosis not present

## 2023-01-22 DIAGNOSIS — G309 Alzheimer's disease, unspecified: Secondary | ICD-10-CM | POA: Diagnosis not present

## 2023-01-22 DIAGNOSIS — E440 Moderate protein-calorie malnutrition: Secondary | ICD-10-CM | POA: Diagnosis not present

## 2023-01-22 DIAGNOSIS — I495 Sick sinus syndrome: Secondary | ICD-10-CM | POA: Diagnosis not present

## 2023-01-22 DIAGNOSIS — I700 Atherosclerosis of aorta: Secondary | ICD-10-CM | POA: Diagnosis not present

## 2023-01-22 DIAGNOSIS — F5104 Psychophysiologic insomnia: Secondary | ICD-10-CM | POA: Diagnosis not present

## 2023-01-23 DIAGNOSIS — E860 Dehydration: Secondary | ICD-10-CM | POA: Diagnosis not present

## 2023-01-23 DIAGNOSIS — R55 Syncope and collapse: Secondary | ICD-10-CM | POA: Diagnosis not present

## 2023-01-23 DIAGNOSIS — R001 Bradycardia, unspecified: Secondary | ICD-10-CM | POA: Diagnosis not present

## 2023-01-23 DIAGNOSIS — Z8673 Personal history of transient ischemic attack (TIA), and cerebral infarction without residual deficits: Secondary | ICD-10-CM | POA: Diagnosis not present

## 2023-01-23 DIAGNOSIS — C9110 Chronic lymphocytic leukemia of B-cell type not having achieved remission: Secondary | ICD-10-CM | POA: Diagnosis not present

## 2023-01-23 DIAGNOSIS — R1111 Vomiting without nausea: Secondary | ICD-10-CM | POA: Diagnosis not present

## 2023-01-24 ENCOUNTER — Inpatient Hospital Stay (HOSPITAL_COMMUNITY)
Admission: EM | Admit: 2023-01-24 | Discharge: 2023-01-30 | DRG: 312 | Disposition: A | Discharge status: Skilled Nursing Facility | Payer: Medicare PPO | Source: Skilled Nursing Facility | Attending: Internal Medicine | Admitting: Internal Medicine

## 2023-01-24 ENCOUNTER — Other Ambulatory Visit: Payer: Self-pay

## 2023-01-24 ENCOUNTER — Emergency Department (HOSPITAL_COMMUNITY): Payer: Medicare PPO

## 2023-01-24 ENCOUNTER — Encounter (HOSPITAL_COMMUNITY): Payer: Self-pay

## 2023-01-24 DIAGNOSIS — R5383 Other fatigue: Secondary | ICD-10-CM | POA: Diagnosis not present

## 2023-01-24 DIAGNOSIS — F32A Depression, unspecified: Secondary | ICD-10-CM | POA: Diagnosis present

## 2023-01-24 DIAGNOSIS — T45516A Underdosing of anticoagulants, initial encounter: Secondary | ICD-10-CM | POA: Diagnosis present

## 2023-01-24 DIAGNOSIS — I495 Sick sinus syndrome: Secondary | ICD-10-CM | POA: Diagnosis present

## 2023-01-24 DIAGNOSIS — K219 Gastro-esophageal reflux disease without esophagitis: Secondary | ICD-10-CM | POA: Diagnosis present

## 2023-01-24 DIAGNOSIS — K631 Perforation of intestine (nontraumatic): Secondary | ICD-10-CM | POA: Diagnosis not present

## 2023-01-24 DIAGNOSIS — R262 Difficulty in walking, not elsewhere classified: Secondary | ICD-10-CM | POA: Diagnosis present

## 2023-01-24 DIAGNOSIS — G4733 Obstructive sleep apnea (adult) (pediatric): Secondary | ICD-10-CM | POA: Diagnosis present

## 2023-01-24 DIAGNOSIS — S199XXA Unspecified injury of neck, initial encounter: Secondary | ICD-10-CM | POA: Diagnosis not present

## 2023-01-24 DIAGNOSIS — J320 Chronic maxillary sinusitis: Secondary | ICD-10-CM | POA: Diagnosis not present

## 2023-01-24 DIAGNOSIS — D61818 Other pancytopenia: Secondary | ICD-10-CM | POA: Diagnosis present

## 2023-01-24 DIAGNOSIS — N179 Acute kidney failure, unspecified: Secondary | ICD-10-CM | POA: Diagnosis present

## 2023-01-24 DIAGNOSIS — R531 Weakness: Secondary | ICD-10-CM | POA: Diagnosis not present

## 2023-01-24 DIAGNOSIS — Z7901 Long term (current) use of anticoagulants: Secondary | ICD-10-CM

## 2023-01-24 DIAGNOSIS — Z888 Allergy status to other drugs, medicaments and biological substances status: Secondary | ICD-10-CM

## 2023-01-24 DIAGNOSIS — R569 Unspecified convulsions: Secondary | ICD-10-CM | POA: Diagnosis not present

## 2023-01-24 DIAGNOSIS — F0393 Unspecified dementia, unspecified severity, with mood disturbance: Secondary | ICD-10-CM | POA: Diagnosis present

## 2023-01-24 DIAGNOSIS — D539 Nutritional anemia, unspecified: Secondary | ICD-10-CM | POA: Diagnosis not present

## 2023-01-24 DIAGNOSIS — Z9049 Acquired absence of other specified parts of digestive tract: Secondary | ICD-10-CM

## 2023-01-24 DIAGNOSIS — J45909 Unspecified asthma, uncomplicated: Secondary | ICD-10-CM | POA: Diagnosis present

## 2023-01-24 DIAGNOSIS — Z8042 Family history of malignant neoplasm of prostate: Secondary | ICD-10-CM

## 2023-01-24 DIAGNOSIS — Z856 Personal history of leukemia: Secondary | ICD-10-CM | POA: Diagnosis not present

## 2023-01-24 DIAGNOSIS — M1990 Unspecified osteoarthritis, unspecified site: Secondary | ICD-10-CM | POA: Diagnosis present

## 2023-01-24 DIAGNOSIS — M542 Cervicalgia: Secondary | ICD-10-CM | POA: Diagnosis present

## 2023-01-24 DIAGNOSIS — F0390 Unspecified dementia without behavioral disturbance: Secondary | ICD-10-CM | POA: Diagnosis present

## 2023-01-24 DIAGNOSIS — Z9181 History of falling: Secondary | ICD-10-CM

## 2023-01-24 DIAGNOSIS — R0781 Pleurodynia: Secondary | ICD-10-CM | POA: Diagnosis present

## 2023-01-24 DIAGNOSIS — Z79899 Other long term (current) drug therapy: Secondary | ICD-10-CM

## 2023-01-24 DIAGNOSIS — I951 Orthostatic hypotension: Principal | ICD-10-CM | POA: Diagnosis present

## 2023-01-24 DIAGNOSIS — R55 Syncope and collapse: Secondary | ICD-10-CM | POA: Diagnosis present

## 2023-01-24 DIAGNOSIS — S3991XA Unspecified injury of abdomen, initial encounter: Secondary | ICD-10-CM | POA: Diagnosis not present

## 2023-01-24 DIAGNOSIS — Z7401 Bed confinement status: Secondary | ICD-10-CM | POA: Diagnosis not present

## 2023-01-24 DIAGNOSIS — I10 Essential (primary) hypertension: Secondary | ICD-10-CM | POA: Diagnosis present

## 2023-01-24 DIAGNOSIS — Z91138 Patient's unintentional underdosing of medication regimen for other reason: Secondary | ICD-10-CM | POA: Diagnosis not present

## 2023-01-24 DIAGNOSIS — Z85038 Personal history of other malignant neoplasm of large intestine: Secondary | ICD-10-CM

## 2023-01-24 DIAGNOSIS — I480 Paroxysmal atrial fibrillation: Secondary | ICD-10-CM | POA: Diagnosis present

## 2023-01-24 DIAGNOSIS — S0990XA Unspecified injury of head, initial encounter: Secondary | ICD-10-CM | POA: Diagnosis not present

## 2023-01-24 DIAGNOSIS — R918 Other nonspecific abnormal finding of lung field: Secondary | ICD-10-CM | POA: Diagnosis not present

## 2023-01-24 DIAGNOSIS — Z950 Presence of cardiac pacemaker: Secondary | ICD-10-CM

## 2023-01-24 DIAGNOSIS — I4891 Unspecified atrial fibrillation: Secondary | ICD-10-CM

## 2023-01-24 DIAGNOSIS — R9089 Other abnormal findings on diagnostic imaging of central nervous system: Secondary | ICD-10-CM | POA: Diagnosis not present

## 2023-01-24 DIAGNOSIS — R29898 Other symptoms and signs involving the musculoskeletal system: Secondary | ICD-10-CM | POA: Diagnosis not present

## 2023-01-24 DIAGNOSIS — W19XXXA Unspecified fall, initial encounter: Secondary | ICD-10-CM | POA: Diagnosis not present

## 2023-01-24 LAB — COMPREHENSIVE METABOLIC PANEL
ALT: 22 U/L (ref 0–44)
AST: 29 U/L (ref 15–41)
Albumin: 3.4 g/dL — ABNORMAL LOW (ref 3.5–5.0)
Alkaline Phosphatase: 88 U/L (ref 38–126)
Anion gap: 10 (ref 5–15)
BUN: 16 mg/dL (ref 8–23)
CO2: 21 mmol/L — ABNORMAL LOW (ref 22–32)
Calcium: 9 mg/dL (ref 8.9–10.3)
Chloride: 109 mmol/L (ref 98–111)
Creatinine, Ser: 1.33 mg/dL — ABNORMAL HIGH (ref 0.61–1.24)
GFR, Estimated: 57 mL/min — ABNORMAL LOW (ref >60)
Glucose, Bld: 94 mg/dL (ref 70–99)
Potassium: 3.7 mmol/L (ref 3.5–5.1)
Sodium: 140 mmol/L (ref 135–145)
Total Bilirubin: 1.2 mg/dL (ref 0.3–1.2)
Total Protein: 5.5 g/dL — ABNORMAL LOW (ref 6.5–8.1)

## 2023-01-24 LAB — I-STAT CHEM 8, ED
BUN: 18 mg/dL (ref 8–23)
Calcium, Ion: 1.08 mmol/L — ABNORMAL LOW (ref 1.15–1.40)
Chloride: 108 mmol/L (ref 98–111)
Creatinine, Ser: 1.2 mg/dL (ref 0.61–1.24)
Glucose, Bld: 95 mg/dL (ref 70–99)
HCT: 36 % — ABNORMAL LOW (ref 39.0–52.0)
Hemoglobin: 12.2 g/dL — ABNORMAL LOW (ref 13.0–17.0)
Potassium: 3.7 mmol/L (ref 3.5–5.1)
Sodium: 140 mmol/L (ref 135–145)
TCO2: 23 mmol/L (ref 22–32)

## 2023-01-24 LAB — TROPONIN I (HIGH SENSITIVITY)
Troponin I (High Sensitivity): 15 ng/L (ref <18)
Troponin I (High Sensitivity): 15 ng/L (ref <18)

## 2023-01-24 LAB — CBC WITH DIFFERENTIAL/PLATELET
Abs Immature Granulocytes: 0.01 10*3/uL (ref 0.00–0.07)
Basophils Absolute: 0 10*3/uL (ref 0.0–0.1)
Basophils Relative: 0 %
Eosinophils Absolute: 0 10*3/uL (ref 0.0–0.5)
Eosinophils Relative: 0 %
HCT: 39.4 % (ref 39.0–52.0)
Hemoglobin: 12.4 g/dL — ABNORMAL LOW (ref 13.0–17.0)
Immature Granulocytes: 0 %
Lymphocytes Relative: 41 %
Lymphs Abs: 2 10*3/uL (ref 0.7–4.0)
MCH: 32.3 pg (ref 26.0–34.0)
MCHC: 31.5 g/dL (ref 30.0–36.0)
MCV: 102.6 fL — ABNORMAL HIGH (ref 80.0–100.0)
Monocytes Absolute: 0.4 10*3/uL (ref 0.1–1.0)
Monocytes Relative: 9 %
Neutro Abs: 2.3 10*3/uL (ref 1.7–7.7)
Neutrophils Relative %: 50 %
Platelets: 150 10*3/uL (ref 150–400)
RBC: 3.84 MIL/uL — ABNORMAL LOW (ref 4.22–5.81)
RDW: 15.2 % (ref 11.5–15.5)
WBC: 4.8 10*3/uL (ref 4.0–10.5)
nRBC: 0 % (ref 0.0–0.2)

## 2023-01-24 LAB — MAGNESIUM: Magnesium: 1.9 mg/dL (ref 1.7–2.4)

## 2023-01-24 LAB — ETHANOL: Alcohol, Ethyl (B): <10 mg/dL (ref <10)

## 2023-01-24 MED ORDER — SODIUM CHLORIDE 0.9% FLUSH
3.0000 mL | Freq: Two times a day (BID) | INTRAVENOUS | Status: DC
  Administered 2023-01-24 – 2023-01-30 (×9): 3 mL via INTRAVENOUS

## 2023-01-24 MED ORDER — DILTIAZEM HCL-DEXTROSE 125-5 MG/125ML-% IV SOLN (PREMIX)
5.0000 mg/h | INTRAVENOUS | Status: DC
  Administered 2023-01-24: 5 mg/h via INTRAVENOUS
  Filled 2023-01-24: qty 125

## 2023-01-24 MED ORDER — TRAZODONE HCL 100 MG PO TABS
100.0000 mg | ORAL_TABLET | Freq: Every day | ORAL | Status: DC
  Administered 2023-01-24 – 2023-01-29 (×6): 100 mg via ORAL
  Filled 2023-01-24 (×6): qty 1

## 2023-01-24 MED ORDER — ATORVASTATIN CALCIUM 10 MG PO TABS
20.0000 mg | ORAL_TABLET | Freq: Every day | ORAL | Status: DC
  Administered 2023-01-24 – 2023-01-29 (×6): 20 mg via ORAL
  Filled 2023-01-24 (×6): qty 2

## 2023-01-24 MED ORDER — POTASSIUM CHLORIDE CRYS ER 10 MEQ PO TBCR
20.0000 meq | EXTENDED_RELEASE_TABLET | Freq: Every day | ORAL | Status: DC
  Administered 2023-01-25 – 2023-01-30 (×6): 20 meq via ORAL
  Filled 2023-01-24 (×12): qty 2

## 2023-01-24 MED ORDER — HYDROCODONE-ACETAMINOPHEN 5-325 MG PO TABS
1.0000 | ORAL_TABLET | Freq: Once | ORAL | Status: AC
  Administered 2023-01-24: 1 via ORAL
  Filled 2023-01-24: qty 1

## 2023-01-24 MED ORDER — SUCRALFATE 1 G PO TABS: 1.0000 g | ORAL_TABLET | Freq: Every day | ORAL | Status: DC | PRN

## 2023-01-24 MED ORDER — POTASSIUM CHLORIDE CRYS ER 20 MEQ PO TBCR
40.0000 meq | EXTENDED_RELEASE_TABLET | Freq: Once | ORAL | Status: AC
  Administered 2023-01-24: 40 meq via ORAL
  Filled 2023-01-24: qty 2

## 2023-01-24 MED ORDER — ALBUTEROL SULFATE (2.5 MG/3ML) 0.083% IN NEBU: 2.5000 mg | INHALATION_SOLUTION | Freq: Four times a day (QID) | RESPIRATORY_TRACT | Status: DC | PRN

## 2023-01-24 MED ORDER — ACETAMINOPHEN 650 MG RE SUPP: 650.0000 mg | Freq: Four times a day (QID) | RECTAL | Status: DC | PRN

## 2023-01-24 MED ORDER — ACETAMINOPHEN 325 MG PO TABS: 650.0000 mg | ORAL_TABLET | Freq: Four times a day (QID) | ORAL | Status: DC | PRN

## 2023-01-24 MED ORDER — SODIUM CHLORIDE 0.9 % IV SOLN: INTRAVENOUS | Status: DC

## 2023-01-24 MED ORDER — DONEPEZIL HCL 5 MG PO TABS
5.0000 mg | ORAL_TABLET | Freq: Every day | ORAL | Status: DC
  Administered 2023-01-24 – 2023-01-29 (×6): 5 mg via ORAL
  Filled 2023-01-24 (×6): qty 1

## 2023-01-24 MED ORDER — IOHEXOL 350 MG/ML SOLN
75.0000 mL | Freq: Once | INTRAVENOUS | Status: AC | PRN
  Administered 2023-01-24: 75 mL via INTRAVENOUS

## 2023-01-24 MED ORDER — BUPROPION HCL ER (XL) 150 MG PO TB24
300.0000 mg | ORAL_TABLET | Freq: Every day | ORAL | Status: DC
  Administered 2023-01-25 – 2023-01-30 (×6): 300 mg via ORAL
  Filled 2023-01-24 (×6): qty 2

## 2023-01-24 MED ORDER — PANTOPRAZOLE SODIUM 40 MG PO TBEC
40.0000 mg | DELAYED_RELEASE_TABLET | Freq: Every day | ORAL | Status: DC
  Administered 2023-01-24 – 2023-01-30 (×7): 40 mg via ORAL
  Filled 2023-01-24 (×7): qty 1

## 2023-01-24 MED ORDER — LACTATED RINGERS IV BOLUS
1000.0000 mL | Freq: Once | INTRAVENOUS | Status: AC
  Administered 2023-01-24: 1000 mL via INTRAVENOUS

## 2023-01-24 MED ORDER — DILTIAZEM LOAD VIA INFUSION
15.0000 mg | Freq: Once | INTRAVENOUS | Status: AC
  Administered 2023-01-24: 15 mg via INTRAVENOUS
  Filled 2023-01-24: qty 15

## 2023-01-24 MED ORDER — METOPROLOL TARTRATE 50 MG PO TABS
75.0000 mg | ORAL_TABLET | Freq: Two times a day (BID) | ORAL | Status: DC
  Administered 2023-01-24 – 2023-01-27 (×6): 75 mg via ORAL
  Filled 2023-01-24 (×3): qty 2
  Filled 2023-01-24: qty 3
  Filled 2023-01-24 (×3): qty 2

## 2023-01-24 MED ORDER — APIXABAN 5 MG PO TABS
5.0000 mg | ORAL_TABLET | Freq: Two times a day (BID) | ORAL | Status: DC
  Administered 2023-01-24 – 2023-01-30 (×12): 5 mg via ORAL
  Filled 2023-01-24 (×12): qty 1

## 2023-01-24 MED ORDER — COLESTIPOL HCL 1 G PO TABS
1.0000 g | ORAL_TABLET | Freq: Every evening | ORAL | Status: DC
  Administered 2023-01-24 – 2023-01-29 (×6): 1 g via ORAL
  Filled 2023-01-24 (×8): qty 1

## 2023-01-24 NOTE — Evaluation (Signed)
Physical Therapy Evaluation Patient Details Name: Jonathon Parker [facility] MRN: [id number] DOB: 05/23/1951 Today's Date: 01/24/2023  History of Present Illness  72 y.o. male presents to [facility] hospital on 01/24/2023 after possible syncopal episode and fall. Pt found to be in afib with RVR in ED. PMH - HTN, R thalamic CVA, colon CA, CLL, GERD, Depression, enteritis, sepsis, mild cognitive impairment, pacer.  Clinical Impression  Pt presents to PT with deficits in functional mobility, balance, cognition, strength, power. Pt reports fatigue during session and declines transfer attempts. PT also notes HR fluctuation on monitor, with brief period of asystole, although pt responsive and communicating, followed by HR in high 30s and slowly increasing back to 70s. Pt will benefit from frequent mobilization in an effort to reduce falls risk and improve activity tolerance. PT will attempt to assess transfer quality next session. Due to physical assistance required in bed mobility PT recommends short term inpatient PT services.     Recommendations for follow up therapy are one component of a multi-disciplinary discharge planning process, led by the attending physician.  Recommendations may be updated based on patient status, additional functional criteria and insurance authorization.  Follow Up Recommendations Can patient physically be transported by private vehicle: No     Assistance Recommended at Discharge Frequent or constant Supervision/Assistance  Patient can return home with the following  A lot of help with walking and/or transfers;A lot of help with bathing/dressing/bathroom;Assistance with cooking/housework;Assist for transportation;Help with stairs or ramp for entrance;Direct supervision/assist for medications management;Direct supervision/assist for financial management    Equipment Recommendations  (defer to post-acute, wheelchair needed if returning to ALF)  Recommendations for Other Services        Functional Status Assessment Patient has had a recent decline in their functional status and demonstrates the ability to make significant improvements in function in a reasonable and predictable amount of time.     Precautions / Restrictions Precautions Precautions: Fall Precaution Comments: afib rvr Restrictions Weight Bearing Restrictions: No      Mobility  Bed Mobility Overal bed mobility: Needs Assistance Bed Mobility: Rolling, Sidelying to Sit, Sit to Sidelying Rolling: Min assist Sidelying to sit: Mod assist     Sit to sidelying: Mod assist      Transfers Overall transfer level:  (pt declines transfer attempts at this time due to fatigue)                      Ambulation/Gait                  Stairs            Wheelchair Mobility    Modified Rankin (Stroke Patients Only)       Balance Overall balance assessment: Needs assistance Sitting-balance support: No upper extremity supported, Feet supported Sitting balance-Leahy Scale: Fair                                       Pertinent Vitals/Pain Pain Assessment Pain Assessment: No/denies pain    Home Living Family/patient expects to be discharged to:: Assisted living                 Home Equipment: Agricultural consultant (2 wheels) Additional Comments: pt recently transitioned to [employer]    Prior Function Prior Level of Function : Needs assist             Mobility Comments:  ambulatory with RW for household distances, walking to dining hall per spouse ADLs Comments: assistance for ADLs since hospitalization in May     Hand Dominance   Dominant Hand: Right    Extremity/Trunk Assessment   Upper Extremity Assessment Upper Extremity Assessment: Generalized weakness    Lower Extremity Assessment Lower Extremity Assessment: Generalized weakness    Cervical / Trunk Assessment Cervical / Trunk Assessment: Kyphotic  Communication   Communication:  No difficulties  Cognition Arousal/Alertness: Awake/alert Behavior During Therapy: Flat affect Overall Cognitive Status: Impaired/Different from baseline Area of Impairment: Attention, Memory, Following commands, Safety/judgement, Awareness, Problem solving                   Current Attention Level: Sustained Memory: Decreased recall of precautions, Decreased short-term memory Following Commands: Follows one step commands with increased time, Follows multi-step commands inconsistently Safety/Judgement: Decreased awareness of safety, Decreased awareness of deficits Awareness: Intellectual Problem Solving: Slow processing, Requires verbal cues General Comments: pt reports he is unable to verbalize his symptoms, unsure of how he feels        General Comments General comments (skin integrity, edema, etc.): monitor in afib, pt HR in 80s at rest with BP 102/77. with transition from supine to sitting pt HR briefly reads "asystole" on monitor, although pacing spikes still noted. HR then returns at rate of 37, before slowly increasing over 30 second periods back to 70s. BP 100/65 in sitting. RN made aware    Exercises     Assessment/Plan    PT Assessment Patient needs continued PT services  PT Problem List Decreased strength;Decreased activity tolerance;Decreased balance;Decreased mobility;Decreased knowledge of use of DME;Cardiopulmonary status limiting activity       PT Treatment Interventions DME instruction;Gait training;Functional mobility training;Therapeutic activities;Therapeutic exercise;Balance training;Neuromuscular re-education;Patient/family education    PT Goals (Current goals can be found in the Care Plan section)  Acute Rehab PT Goals Patient Stated Goal: to improve mobility quality, return to ambulating PT Goal Formulation: With patient/family Time For Goal Achievement: 02/07/23 Potential to Achieve Goals: Fair    Frequency Min 1X/week     Co-evaluation                AM-PAC PT "6 Clicks" Mobility  Outcome Measure Help needed turning from your back to your side while in a flat bed without using bedrails?: A Little Help needed moving from lying on your back to sitting on the side of a flat bed without using bedrails?: A Lot Help needed moving to and from a bed to a chair (including a wheelchair)?: Total Help needed standing up from a chair using your arms (e.g., wheelchair or bedside chair)?: A Lot Help needed to walk in hospital room?: Total Help needed climbing 3-5 steps with a railing? : Total 6 Click Score: 10    End of Session   Activity Tolerance: Patient limited by fatigue Patient left: in bed;with call bell/phone within reach;with bed alarm set;with family/visitor present Nurse Communication: Mobility status PT Visit Diagnosis: Other abnormalities of gait and mobility (R26.89);Muscle weakness (generalized) (M62.81)    Time: 1478-2956 PT Time Calculation (min) (ACUTE ONLY): 22 min   Charges:   PT Evaluation $PT Eval Low Complexity: 1 Low          Arlyss Gandy, PT, DPT Acute Rehabilitation Office [phone]   Arlyss Gandy 01/24/2023, 5:46 PM

## 2023-01-24 NOTE — ED Provider Notes (Signed)
[facility] AT [facility] Provider Note   CSN: [id number] Arrival date & time: 01/24/23  1043     History  Chief Complaint  Patient presents with   Jonathon Parker is a 72 y.o. male.  Patient is a 72 year old male with a past medical history of A-fib on Eliquis, CLL, GERD, SSS with pacemaker in place presenting to the emergency department after a possible syncopal fall.  Patient states that he remembers being in the shower yesterday and feeling lightheaded and dizzy and next thing he knew he woke up on the ground.  I spoke with the nursing home staff and his fall was unwitnessed but they state when they initially found him he was unresponsive in the shower covered with vomitus and had a bowel movement.  He reports that he has had a decreased appetite recently and has not been eating well.  He states that he was initially evaluated at [facility] ED yesterday but reported that he had no imaging or lab evaluation done and was discharged home.  Home staff states that they are concerned that he hit his head and is on Eliquis.  They state he was also complaining of neck pain, abdominal pain and bilateral rib pain.  He states that he has missed a few doses of his Eliquis last few days from being in and out of the hospital.  He denies any known history of seizures.  He states that he used to drink alcohol but has not had a drink in over a month.  The history is provided by the patient.  Fall       Home Medications Prior to Admission medications   Medication Sig Start Date End Date Taking? Authorizing Provider  acetaminophen (TYLENOL) 500 MG tablet Take 1,000 mg by mouth every 6 (six) hours as needed for moderate pain or mild pain.    [provider]  albuterol (VENTOLIN HFA) 108 (90 Base) MCG/ACT inhaler Inhale 2 puffs into the lungs every 6 (six) hours as needed for wheezing or shortness of breath. 11/26/20   Mathews Argyle, NP  apixaban  (ELIQUIS) 5 MG TABS tablet Take 5 mg by mouth 2 (two) times daily. 11/01/22   [provider]  atorvastatin (LIPITOR) 20 MG tablet Take 20 mg by mouth daily. 03/22/21   [provider]  buPROPion (WELLBUTRIN XL) 300 MG 24 hr tablet Take 1 tablet (300 mg total) by mouth daily. 11/26/20   Mathews Argyle, NP  cholecalciferol (VITAMIN D3) 25 MCG (1000 UNIT) tablet Take 2 tablets (2,000 Units total) by mouth daily. Takes 2 tablets (1,000 units each) for a total of 2,000 units daily Patient taking differently: Take 2,000 Units by mouth daily. 11/26/20   Mathews Argyle, NP  colestipol (COLESTID) 1 g tablet Take 1 g by mouth every evening. 11/20/21   [provider]  donepezil (ARICEPT) 5 MG tablet Take 1 tablet (5 mg total) by mouth daily. Patient taking differently: Take 5 mg by mouth at bedtime. 12/07/21   Van Clines, MD  haloperidol (HALDOL) 2 MG tablet Take 1 tablet (2 mg total) by mouth daily for 2 days. 01/04/23 01/06/23  Burnadette Pop, MD  haloperidol (HALDOL) 5 MG tablet Take 1 tablet (5 mg total) by mouth at bedtime for 2 days. 01/03/23 01/05/23  Burnadette Pop, MD  melatonin 3 MG TABS tablet Take 1 tablet (3 mg total) by mouth daily at 8 pm. 01/03/23   Adhikari,  Amrit, MD  Metoprolol Tartrate 75 MG TABS Take 1 tablet (75 mg total) by mouth 2 (two) times daily. 01/03/23   Burnadette Pop, MD  ondansetron (ZOFRAN) 8 MG tablet Take 8 mg by mouth every 8 (eight) hours as needed for nausea or vomiting. 10/31/21   [provider]  pantoprazole (PROTONIX) 40 MG tablet Take 40 mg by mouth daily. 10/31/21   [provider]  polyethylene glycol (MIRALAX / GLYCOLAX) 17 g packet Take 17 g by mouth daily. 01/04/23   Burnadette Pop, MD  potassium chloride (KLOR-CON) 10 MEQ tablet Take 20 mEq by mouth daily. 11/15/21   [provider]  sucralfate (CARAFATE) 1 g tablet Take 1 g by mouth daily as needed (stomach discomfort). 12/02/22   [provider]  traZODone  (DESYREL) 100 MG tablet Take 1 tablet (100 mg total) by mouth at bedtime. 11/26/20   Mathews Argyle, NP      Allergies    Lisinopril, Seroquel [quetiapine], and Zolpidem    Review of Systems   Review of Systems  Physical Exam Updated Vital Signs BP 107/66   Pulse (!) 51   Temp 98.1 F (36.7 C) (Oral)   Resp 16   Ht 6' (1.829 m)   Wt 63.5 kg   SpO2 99%   BMI 18.99 kg/m  Physical Exam Vitals and nursing note reviewed.  Constitutional:      General: He is not in acute distress.    Appearance: Normal appearance.  HENT:     Head: Normocephalic and atraumatic.     Nose: Nose normal.     Mouth/Throat:     Mouth: Mucous membranes are moist.  Eyes:     Extraocular Movements: Extraocular movements intact.     Conjunctiva/sclera: Conjunctivae normal.     Pupils: Pupils are equal, round, and reactive to light.  Neck:     Comments: No midline neck tenderness, mild bilateral paraspinal muscle tenderness to palpation Cardiovascular:     Rate and Rhythm: Tachycardia present. Rhythm irregular.     Pulses: Normal pulses.     Heart sounds: Normal heart sounds.  Pulmonary:     Effort: Pulmonary effort is normal.     Breath sounds: Normal breath sounds.  Abdominal:     General: Abdomen is flat.     Palpations: Abdomen is soft.     Tenderness: There is no guarding or rebound.     Comments: Well-healing midline abdominal scar, mild diffuse tenderness to palpation  Musculoskeletal:        General: Normal range of motion.     Cervical back: Normal range of motion and neck supple.     Comments: No midline back tenderness No bony tenderness to bilateral upper or lower extremities Chest wall tenderness to palpation  Skin:    General: Skin is warm and dry.  Neurological:     General: No focal deficit present.     Mental Status: He is alert and oriented to person, place, and time.     Sensory: No sensory deficit.     Motor: No weakness.  Psychiatric:        Mood and Affect: Mood  normal.        Behavior: Behavior normal.     ED Results / Procedures / Treatments   Labs (all labs ordered are listed, but only abnormal results are displayed) Labs Reviewed  COMPREHENSIVE METABOLIC PANEL - Abnormal; Notable for the following components:      Result Value  CO2 21 (*)    Creatinine, Ser 1.33 (*)    Total Protein 5.5 (*)    Albumin 3.4 (*)    GFR, Estimated 57 (*)    All other components within normal limits  CBC WITH DIFFERENTIAL/PLATELET - Abnormal; Notable for the following components:   RBC 3.84 (*)    Hemoglobin 12.4 (*)    MCV 102.6 (*)    All other components within normal limits  I-STAT CHEM 8, ED - Abnormal; Notable for the following components:   Calcium, Ion 1.08 (*)    Hemoglobin 12.2 (*)    HCT 36.0 (*)    All other components within normal limits  ETHANOL  MAGNESIUM  TROPONIN I (HIGH SENSITIVITY)  TROPONIN I (HIGH SENSITIVITY)    EKG None  Radiology CT Head Wo Contrast  Result Date: 01/24/2023 CLINICAL DATA:  Head trauma, minor (Age >= 65y); Neck trauma (Age >= 65y). EXAM: CT HEAD WITHOUT CONTRAST CT CERVICAL SPINE WITHOUT CONTRAST TECHNIQUE: Multidetector CT imaging of the head and cervical spine was performed following the standard protocol without intravenous contrast. Multiplanar CT image reconstructions of the cervical spine were also generated. RADIATION DOSE REDUCTION: This exam was performed according to the departmental dose-optimization program which includes automated exposure control, adjustment of the mA and/or kV according to patient size and/or use of iterative reconstruction technique. COMPARISON:  Head CT 12/25/2022. FINDINGS: CT HEAD FINDINGS Brain: No acute hemorrhage. 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 Cortical gray-white differentiation is otherwise preserved. No hydrocephalus or extra-axial collection. No mass effect or midline  shift. Vascular: No hyperdense vessel or unexpected calcification. Skull: No calvarial fracture or suspicious bone lesion. Skull base is unremarkable. Sinuses/Orbits: Mild mucosal disease in the maxillary sinuses. Orbits and mastoids are unremarkable. Other: None. CT CERVICAL SPINE FINDINGS Alignment: Normal. Skull base and vertebrae: No acute fracture. Normal craniocervical junction. No suspicious bone lesions. Soft tissues and spinal canal: No prevertebral fluid or swelling. No visible canal hematoma. Disc levels: Multilevel cervical spondylosis with at least mild spinal canal stenosis from C3-4 to C6-7. Upper chest: Unremarkable. Other: Atherosclerotic calcifications of the carotid bulbs. IMPRESSION: 1. No acute intracranial abnormality. Unchanged mild chronic small-vessel disease and age advanced volume loss. 2. No acute fracture or traumatic listhesis in the cervical spine. Multilevel cervical spondylosis with at least mild spinal canal stenosis from C3-4 to C6-7. Electronically Signed   By: Orvan Falconer M.D.   On: 01/24/2023 12:52   CT Cervical Spine Wo Contrast  Result Date: 01/24/2023 CLINICAL DATA:  Head trauma, minor (Age >= 65y); Neck trauma (Age >= 65y). EXAM: CT HEAD WITHOUT CONTRAST CT CERVICAL SPINE WITHOUT CONTRAST TECHNIQUE: Multidetector CT imaging of the head and cervical spine was performed following the standard protocol without intravenous contrast. Multiplanar CT image reconstructions of the cervical spine were also generated. RADIATION DOSE REDUCTION: This exam was performed according to the departmental dose-optimization program which includes automated exposure control, adjustment of the mA and/or kV according to patient size and/or use of iterative reconstruction technique. COMPARISON:  Head CT 12/25/2022. FINDINGS: CT HEAD FINDINGS Brain: No acute hemorrhage. 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 Cortical gray-white differentiation is otherwise preserved. No hydrocephalus or extra-axial collection. No mass effect or midline shift. Vascular: No hyperdense vessel or unexpected calcification. Skull: No calvarial fracture or suspicious bone lesion. Skull base is unremarkable. Sinuses/Orbits:  Mild mucosal disease in the maxillary sinuses. Orbits and mastoids are unremarkable. Other: None. CT CERVICAL SPINE FINDINGS Alignment: Normal. Skull base and vertebrae: No acute fracture. Normal craniocervical junction. No suspicious bone lesions. Soft tissues and spinal canal: No prevertebral fluid or swelling. No visible canal hematoma. Disc levels: Multilevel cervical spondylosis with at least mild spinal canal stenosis from C3-4 to C6-7. Upper chest: Unremarkable. Other: Atherosclerotic calcifications of the carotid bulbs. IMPRESSION: 1. No acute intracranial abnormality. Unchanged mild chronic small-vessel disease and age advanced volume loss. 2. No acute fracture or traumatic listhesis in the cervical spine. Multilevel cervical spondylosis with at least mild spinal canal stenosis from C3-4 to C6-7. Electronically Signed   By: Orvan Falconer M.D.   On: 01/24/2023 12:52   CT ABDOMEN PELVIS W CONTRAST  Result Date: 01/24/2023 CLINICAL DATA:  Abdominal trauma, blunt. EXAM: CT ABDOMEN AND PELVIS WITH CONTRAST TECHNIQUE: Multidetector CT imaging of the abdomen and pelvis was performed using the standard protocol following bolus administration of intravenous contrast. RADIATION DOSE REDUCTION: This exam was performed according to the departmental dose-optimization program which includes automated exposure control, adjustment of the mA and/or kV according to patient size and/or use of iterative reconstruction technique. CONTRAST:  75mL OMNIPAQUE IOHEXOL 350 MG/ML SOLN COMPARISON:  12/15/2022 FINDINGS: Lower chest: Residual subsegmental atelectasis noted in the lung bases. The small pleural effusions have resolved  in the interval. Hepatobiliary: No focal liver abnormality is seen. Status post cholecystectomy. No biliary dilatation. Pancreas: Unremarkable. No pancreatic ductal dilatation or surrounding inflammatory changes. Spleen: Normal in size without focal abnormality. Adrenals/Urinary Tract: Adrenal glands are unremarkable. Kidneys are normal, without renal calculi, focal lesion, or hydronephrosis. Bladder is unremarkable. Stomach/Bowel: Stomach appears normal. No pathologic dilatation of the large or small bowel loops. No bowel wall thickening or inflammation. Signs of recent small bowel resection with enteroenteric anastomosis within the right upper quadrant of the abdomen. No signs of anastomotic dehiscence. Mild surrounding soft tissue stranding is favored to represent residual postoperative inflammation. Postsurgical changes from remote APR and appendectomy. Vascular/Lymphatic: Aortic atherosclerosis. No signs of abdominopelvic adenopathy. Reproductive: Prostate is unremarkable. Other: Previous bilateral inguinal herniorrhaphy. There is no signs of free fluid or fluid collections. No pneumoperitoneum. Postsurgical changes identified along the midline of the ventral abdominal wall. Musculoskeletal: Degenerative disc disease is noted at L5-S1. Mild first degree anterolisthesis of L5 on S1 noted. No acute or suspicious osseous findings. IMPRESSION: 1. No acute findings identified within the abdomen or pelvis. 2. Signs of recent small bowel resection with enteroenteric anastomosis within the right upper quadrant of the abdomen. No signs of anastomotic dehiscence. Mild surrounding soft tissue stranding is favored to represent residual postoperative inflammation. 3. Interval resolution of small pleural effusions. 4.  Aortic Atherosclerosis (ICD10-I70.0). Electronically Signed   By: Signa Kell M.D.   On: 01/24/2023 12:52   DG Chest Port 1 View  Result Date: 01/24/2023 CLINICAL DATA:  Syncope. EXAM: PORTABLE CHEST 1  VIEW COMPARISON:  12/21/2022 FINDINGS: The lungs are clear without focal pneumonia, edema, pneumothorax or pleural effusion. Streaky opacity in the left base suggest atelectasis or scarring. The cardiopericardial silhouette is within normal limits for size. Left-sided permanent pacemaker noted. Telemetry leads overlie the chest. IMPRESSION: Streaky left basilar opacity suggests atelectasis or scarring. Otherwise no acute cardiopulmonary findings. Electronically Signed   By: Kennith Center M.D.   On: 01/24/2023 12:25    Procedures .Critical Care  Performed by: Rexford Maus, DO Authorized by: Rexford Maus, DO   Critical  care provider statement:    Critical care time (minutes):  40   Critical care time was exclusive of:  Separately billable procedures and treating other patients   Critical care was necessary to treat or prevent imminent or life-threatening deterioration of the following conditions:  Circulatory failure   Critical care was time spent personally by me on the following activities:  Development of treatment plan with patient or surrogate, discussions with primary provider, evaluation of patient's response to treatment, examination of patient, obtaining history from patient or surrogate, ordering and performing treatments and interventions, ordering and review of laboratory studies, re-evaluation of patient's condition, ordering and review of radiographic studies, pulse oximetry and review of old charts   I assumed direction of critical care for this patient from another provider in my specialty: no     Care discussed with: admitting provider       Medications Ordered in ED Medications  diltiazem (CARDIZEM) 1 mg/mL load via infusion 15 mg (15 mg Intravenous Bolus from Bag 01/24/23 1136)    And  diltiazem (CARDIZEM) 125 mg in dextrose 5% 125 mL (1 mg/mL) infusion (5 mg/hr Intravenous New Bag/Given 01/24/23 1136)  lactated ringers bolus 1,000 mL (0 mLs Intravenous Stopped  01/24/23 1352)  HYDROcodone-acetaminophen (NORCO/VICODIN) 5-325 MG per tablet 1 tablet (1 tablet Oral Given 01/24/23 1217)  iohexol (OMNIPAQUE) 350 MG/ML injection 75 mL (75 mLs Intravenous Contrast Given 01/24/23 1215)  potassium chloride SA (KLOR-CON M) CR tablet 40 mEq (40 mEq Oral Given 01/24/23 1352)    ED Course/ Medical Decision Making/ A&P Clinical Course as of 01/24/23 1411  Fri Jan 24, 2023  1256 Upon reassessment, patient is in rate controlled a fib. K 3.7 and will be given repletion to K >4, No traumatic injuries on CT imaging. Unclear etiology of syncope vs seizure though may be secondary to a fib with RVR and will be admitted for further management. [VK]    Clinical Course User Index [VK] Rexford Maus, DO                             Medical Decision Making This patient presents to the ED with chief complaint(s) of fall, possible syncope versus seizure with pertinent past medical history of CLL, A-fib on Eliquis, hypertension, SSS with pacemaker in place which further complicates the presenting complaint. The complaint involves an extensive differential diagnosis and also carries with it a high risk of complications and morbidity.    The differential diagnosis includes ACS, arrhythmia, anemia, dehydration, electrolyte abnormality, infection, possible seizure, does not appear to be in alcohol withdrawal, considering ICH, mass effect, cervical spine fracture, blunt thoracic or abdominal injury, no other traumatic injuries seen on exam  Additional history obtained: Additional history obtained from nursing home/care facility Records reviewed Care Everywhere/External Records outpatient cardiology records Device interrogation 01/23/23: [employer] Pacemaker transmission. Parameters reviewed. (25) SVT, available EGMs appear to be AF with RVR ~162-176 bpm. (1) VT-NS lasting ~3 sec ~156 bpm. (1)Monitored Fast A&V Episode that appears to be AF w/ RVR ~141 bpm.10 days in AT/AF  Since Last Session. AF burden 23.3%, pt taking Eliquis.    ED Course and Reassessment: On patient's arrival to the emergency department he was tachycardic in the 140s to 150s and otherwise hemodynamically stable in no acute distress.  EKG on arrival showed A-fib with RVR without acute ischemic changes.  Patient reports that he has missed several doses of anticoagulation over the last  few days from being in and out of the hospital and thus we will plan for rate control at this time.  He is no known heart failure history and will be started on diltiazem push and infusion.  Patient will additionally have labs and imaging performed to evaluate for possible cause of his seizure versus syncope and evaluate for traumatic injury.  He will be closely reassessed.  Independent labs interpretation:  The following labs were independently interpreted: Mild AKI otherwise within normal range  Independent visualization of imaging: - I independently visualized the following imaging with scope of interpretation limited to determining acute life threatening conditions related to emergency care: CTH/C-spine, CXR, CTAP, which revealed no acute traumatic injury  Consultation: - Consulted or discussed management/test interpretation w/ external professional: hospitalist  Consideration for admission or further workup: patient requires admission for his syncope and a fib w rvr Social Determinants of health: N/A    Amount and/or Complexity of Data Reviewed Labs: ordered. Radiology: ordered.  Risk Prescription drug management. Decision regarding hospitalization.          Final Clinical Impression(s) / ED Diagnoses Final diagnoses:  Atrial fibrillation with rapid ventricular response (HCC)  Syncope, unspecified syncope type    Rx / DC Orders ED Discharge Orders     None         Rexford Maus, DO 01/24/23 1411

## 2023-01-24 NOTE — Progress Notes (Signed)
Diltiazem gtt stopped d/t pt HR sustaining in the 40s for a short period of time while working with PT. Pt was dizzy and lightheaded during this episode. Once gtt stopped, pt sustaining in the high 80s, low 90s. MD made aware, no new orders. Will continue to monitor.

## 2023-01-24 NOTE — H&P (Signed)
History and Physical    Patient: Jonathon Parker [facility] ZOX:[id number] DOB: [birth year] DOA: 01/24/2023 DOS: the patient was seen and examined on 01/24/2023 PCP: Paulina Fusi, MD  [facility]  Chief Complaint:  Chief Complaint  Patient presents with   Fall   HPI: Jonathon Parker is a 72 y.o. male with medical history significant of  history of CLL, paroxysmal A-fib on Eliquis, tachybradycardia syndrome  s/p PPM, history of colon cancer s/p hemicolectomy more than 10 years ago, GERD who presents after having a fall yesterday while in the shower.  He remembers feeling lightheaded and dizzy prior to waking up on the floor.  It was reported that he was found unresponsive on the shower floor having had vomited and had bowel movement on himself.  Denies any prior history of seizures.  He denies having any recent shortness of breath, chest pain, pain, or diarrhea.  Patient reports that he has felt his heart beating irregularly more recently.  With the fall he thinks that he hit his head.  He reported having some neck pain, and rib pain.  Patient had initially been evaluated at [facility], but he reports no been done prior to him being sent back to the nursing facility.  Nursing facility sent him back to the hospital further workup.  Due to this patient had missed a couple doses of his regular medications.  He has not drink alcohol and over a month.  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  Complicated by prolonged ileus requiring placement on TPN. Since his past hospitalization he had not been eating or drinking like usual.   In the emergency department patient was noted to be afebrile with heart rates in atrial fibrillation up to 144, respirations 15-23, and all other vital signs maintained.  Labs significant for hemoglobin  12.4, BUN 16, and creatinine 1.33.  CT scan had been done of the head, cervical spine, abdomen, and pelvis.  No acute abnormality or fracture was appreciated on imaging.  Patient had recent small bowel resection and there was note of some mild inflammatory stranding favored to represent residual post operative inflammation.  Patient has been given 1 L lactated Ringer's, potassium chloride 40 mEq, and started on Cardizem drip for rate control.   Review of Systems: As mentioned in the history of present illness. All other systems reviewed and are negative. 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 Valentino Hue in [facility] hospital     Depression    GERD (gastroesophageal reflux disease)    controlled with nexium    Hypertension    Pacemaker    Past Surgical History:  Procedure Laterality Date   APPENDECTOMY     CARPAL TUNNEL RELEASE     left    CHOLECYSTECTOMY N/A 04/20/2019   Procedure: LAPAROSCOPIC CHOLECYSTECTOMY;  Surgeon: [name], MD;  Location: [facility] ORS;  Service: General;  Laterality: N/A;   COLON SURGERY  2002   10 inches of colon taken out    COLONOSCOPY     ENDOSCOPIC RETROGRADE CHOLANGIOPANCREATOGRAPHY (ERCP) WITH PROPOFOL N/A 03/01/2019   Procedure: ENDOSCOPIC RETROGRADE CHOLANGIOPANCREATOGRAPHY (ERCP) WITH PROPOFOL;  Surgeon: [name]., MD;  Location: [facility] ENDOSCOPY;  Service: Gastroenterology;  Laterality: N/A;   ERCP  03/01/2019   HERNIA REPAIR     bilateral inguinal    IR CHOLANGIOGRAM EXISTING TUBE  03/25/2019   IR PERC CHOLECYSTOSTOMY  03/02/2019   LAPAROTOMY N/A 12/18/2022   Procedure: EXPLORATORY LAPAROTOMY, BOWEL RESECTION;  Surgeon: [name], MD;  Location: [facility] OR;  Service: General;  Laterality: N/A;   left heel reconstruction  01/2002   17 screws and 2 plates    PACEMAKER INSERTION     POLYPECTOMY      REMOVAL OF STONES  03/01/2019   Procedure: REMOVAL OF STONES;  Surgeon: [name]., MD;  Location: [facility] ENDOSCOPY;  Service: Gastroenterology;;   [name]  03/01/2019   Procedure: [name];  Surgeon: [name]., MD;  Location: [facility] ENDOSCOPY;  Service: Gastroenterology;;   tooth extracted     with laughing gas    UPPER GASTROINTESTINAL ENDOSCOPY     Social History:  reports that he has never smoked. He has never used smokeless tobacco. He reports current alcohol use. He reports that he does not use drugs.  Allergies  Allergen Reactions   Lisinopril Cough   Seroquel [Quetiapine] Other (See Comments)   Zolpidem Other (See Comments)    "Got me in outer space"     Family History  Problem Relation Age of Onset   Prostate cancer Brother    Colon cancer Neg Hx    Esophageal cancer Neg Hx    Colon polyps Neg Hx    Rectal cancer Neg Hx    Stomach cancer Neg Hx     Prior to Admission medications   Medication Sig Start Date End Date Taking? Authorizing Provider  acetaminophen (TYLENOL) 500 MG tablet Take 1,000 mg by mouth every 6 (six) hours as needed for moderate pain or mild pain.    [provider]  albuterol (VENTOLIN HFA) 108 (90 Base) MCG/ACT inhaler Inhale 2 puffs into the lungs every 6 (six) hours as needed for wheezing or shortness of breath. 11/26/20   Mathews Argyle, NP  apixaban (ELIQUIS) 5 MG TABS tablet Take 5 mg by mouth 2 (two) times daily. 11/01/22   [provider]  atorvastatin (LIPITOR) 20 MG tablet Take 20 mg by mouth daily. 03/22/21   [provider]  buPROPion (WELLBUTRIN XL) 300 MG 24 hr tablet Take 1 tablet (300 mg total) by mouth daily. 11/26/20   Mathews Argyle, NP  cholecalciferol (VITAMIN D3) 25 MCG (1000 UNIT) tablet Take 2 tablets (2,000 Units total) by mouth daily. Takes 2 tablets (1,000 units each) for a total of 2,000 units daily Patient taking differently: Take 2,000 Units by mouth daily. 11/26/20    Mathews Argyle, NP  colestipol (COLESTID) 1 g tablet Take 1 g by mouth every evening. 11/20/21   [provider]  donepezil (ARICEPT) 5 MG tablet Take 1 tablet (5 mg total) by mouth daily. Patient taking differently: Take 5 mg by mouth at bedtime. 12/07/21   Van Clines, MD  haloperidol (HALDOL) 2 MG tablet Take 1 tablet (2 mg total) by mouth daily for 2 days. 01/04/23 01/06/23  Burnadette Pop, MD  haloperidol (HALDOL) 5 MG tablet Take 1 tablet (5 mg total) by mouth at bedtime for 2 days. 01/03/23 01/05/23  Burnadette Pop, MD  melatonin 3 MG TABS tablet Take 1 tablet (3 mg total) by mouth daily at 8 pm. 01/03/23   Burnadette Pop, MD  Metoprolol Tartrate 75 MG TABS Take 1 tablet (75 mg total)  by mouth 2 (two) times daily. 01/03/23   Burnadette Pop, MD  ondansetron (ZOFRAN) 8 MG tablet Take 8 mg by mouth every 8 (eight) hours as needed for nausea or vomiting. 10/31/21   [provider]  pantoprazole (PROTONIX) 40 MG tablet Take 40 mg by mouth daily. 10/31/21   [provider]  polyethylene glycol (MIRALAX / GLYCOLAX) 17 g packet Take 17 g by mouth daily. 01/04/23   Burnadette Pop, MD  potassium chloride (KLOR-CON) 10 MEQ tablet Take 20 mEq by mouth daily. 11/15/21   [provider]  sucralfate (CARAFATE) 1 g tablet Take 1 g by mouth daily as needed (stomach discomfort). 12/02/22   [provider]  traZODone (DESYREL) 100 MG tablet Take 1 tablet (100 mg total) by mouth at bedtime. 11/26/20   Mathews Argyle, NP    Physical Exam: Vitals:   01/24/23 1215 01/24/23 1230 01/24/23 1300 01/24/23 1330  BP: 109/67 113/65 (!) 113/96 107/66  Pulse: 61 82 79 (!) 51  Resp: 18 20 15 16   Temp:      TempSrc:      SpO2: 100% 100% 100% 99%  Weight:      Height:       Constitutional: Elderly male currently in no acute distress Eyes: PERRL, lids and conjunctivae normal ENMT: Mucous membranes are moist.   Neck: normal, supple,   Respiratory: clear to auscultation  bilaterally, no wheezing, no crackles. Normal respiratory effort.  Cardiovascular: Regular rate and rhythm, no murmurs / rubs / gallops. No extremity edema. 2+ pedal pulses.   Abdomen: no tenderness, no masses palpated.   Bowel sounds positive.  Musculoskeletal: no clubbing / cyanosis. No joint deformity upper and lower extremities. Good ROM, no contractures. Normal muscle tone.  Skin: no rashes, lesions, ulcers. No induration Neurologic: CN 2-12 grossly intact.   Strength 5/5 in all 4.  Psychiatric: Appears alert and oriented to person, place, and time currently.  Data Reviewed:  EKG revealed atrial fibrillation at 145 bpm.  Reviewed labs, imaging, and pertinent records as noted above in the HPI.  Assessment and Plan:  Suspected syncope and collapse Patient reported feeling lightheaded prior to falling yesterday.  He had been found on the floor in the shower in his vomitus and bowel movement.   CT imaging of the head did not note any acute intercranial abnormality.  No prior history of seizure.  Suspect symptoms more likely secondary to syncope more so than seizure. -Admit to a telemetry bed -Up with assistance -Check orthostatic vital signs -Check EEG -PT/OT to evaluate and treat  Paroxysmal atrial fibrillation on chronic anticoagulation Tachybradycardia syndrome s/p PPM Patient was noted to be in atrial fibrillation with heart rates elevated into the 140s.  Patient with a history of tachybradycardia syndrome  s/p permanent pacemaker 11/12/2022.  Initially started on a Cardizem drip. -Goal potassium at least 4 and magnesium at least 2 -Nursing care order placed to interrogate [employer] pacemaker -Discontinue Cardizem -Resume metoprolol and Eliquis  Acute kidney injury Creatinine 1.33 with BUN 16.  Baseline creatinine 0.9 checked on 5/30.  Possibly secondary to hypoperfusion in the setting of atrial fibrillation.  Patient had been given 1 L IV fluids. -Continue normal saline IV fluids  at 75 mL/h overnight -Recheck kidney function in a.m.  Macrocytic anemia Chronic.  Hemoglobin 12.4 with MCV 102.6.  Hemoglobin appears improved from prior of 9.8 on 5/30.  Patient denied any reports of bleeding. -Recheck CBC tomorrow morning  Essential hypertension Blood pressures were initially  noted to be maintained. -Continue metoprolol as tolerated  History of bowel perforation Patient hospitalized last month requiring explorative laparotomy with lysis of adhesions, small bowel resection with primary anastomosis for contained perforation of the jejunal diverticulum  Depression -Continue Wellbutrin  Dementia Symptoms is appear to be mild as patient is alert and oriented x 3 currently. -Delirium precautions -Continue Aricept  GERD -Continue Protonix and Carafate  OSA on CPAP -Continue CPAP at night   DVT prophylaxis: Eliquis Advance Care Planning:   Code Status: Full Code    Consults: none  Family Communication:  Severity of Illness: The appropriate patient status for this patient is OBSERVATION. Observation status is judged to be reasonable and necessary in order to provide the required intensity of service to ensure the patient's safety. The patient's presenting symptoms, physical exam findings, and initial radiographic and laboratory data in the context of their medical condition is felt to place them at decreased risk for further clinical deterioration. Furthermore, it is anticipated that the patient will be medically stable for discharge from the hospital within 2 midnights of admission.   Author: Clydie Braun, MD 01/24/2023 2:12 PM  For on call review [email or web address].

## 2023-01-24 NOTE — ED Triage Notes (Signed)
Pt present to ED from [name] for re-evaluation of fall on yesterday. EMS states pt was seen and treated at [facility] and returned back to facility. [facility] facility request that pt have more testing d/t fall being possibly an syncopal episode or seizure. Pt denies hx of seizures at this time.

## 2023-01-24 NOTE — ED Notes (Signed)
ED TO INPATIENT HANDOFF REPORT  ED Nurse Name and Phone #:  Theophilus Bones [phone]  S Name/Age/Gender Jonathon [facility] 72 y.o. male Room/Bed: 022C/022C  Code Status   Code Status: Prior  Home/SNF/Other Skilled nursing facility Patient oriented to: self, place, time, and situation Is this baseline?  Pt forgetful at times, otherwise A&Ox4  Triage Complete: Triage complete  Chief Complaint weakness  Triage Note Pt present to ED from [facility] for re-evaluation of fall on yesterday. EMS states pt was seen and treated at [facility] and returned back to facility. [facility] facility request that pt have more testing d/t fall being possibly an syncopal episode or seizure. Pt denies hx of seizures at this time.    Allergies Allergies  Allergen Reactions   Lisinopril Cough   Seroquel [Quetiapine] Other (See Comments)   Zolpidem Other (See Comments)    "Got me in outer space"     Level of Care/Admitting Diagnosis ED Disposition     ED Disposition  Admit   Condition  --   Comment  The patient appears reasonably stabilized for admission considering the current resources, flow, and capabilities available in the ED at this time, and I doubt any other [facility] requiring further screening and/or treatment in the ED prior to admission is  present.          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 Valentino Hue in [facility] hospital     Depression    GERD (gastroesophageal reflux disease)    controlled with nexium    Hypertension    Pacemaker    Past Surgical History:  Procedure Laterality Date   APPENDECTOMY     CARPAL TUNNEL RELEASE     left    CHOLECYSTECTOMY N/A 04/20/2019   Procedure: LAPAROSCOPIC CHOLECYSTECTOMY;  Surgeon: [name], MD;  Location: [facility] ORS;  Service: General;   Laterality: N/A;   COLON SURGERY  2002   10 inches of colon taken out    COLONOSCOPY     ENDOSCOPIC RETROGRADE CHOLANGIOPANCREATOGRAPHY (ERCP) WITH PROPOFOL N/A 03/01/2019   Procedure: ENDOSCOPIC RETROGRADE CHOLANGIOPANCREATOGRAPHY (ERCP) WITH PROPOFOL;  Surgeon: [name]., MD;  Location: [facility] ENDOSCOPY;  Service: Gastroenterology;  Laterality: N/A;   ERCP  03/01/2019   HERNIA REPAIR     bilateral inguinal    IR CHOLANGIOGRAM EXISTING TUBE  03/25/2019   IR PERC CHOLECYSTOSTOMY  03/02/2019   LAPAROTOMY N/A 12/18/2022   Procedure: EXPLORATORY LAPAROTOMY, BOWEL RESECTION;  Surgeon: [name], MD;  Location: [facility] OR;  Service: General;  Laterality: N/A;   left heel reconstruction  01/2002   17 screws and 2 plates    PACEMAKER INSERTION     POLYPECTOMY     REMOVAL OF STONES  03/01/2019   Procedure: REMOVAL OF STONES;  Surgeon: [name]., MD;  Location: [facility] ENDOSCOPY;  Service: Gastroenterology;;   [name]  03/01/2019   Procedure: [name];  Surgeon: [name]., MD;  Location: [facility] ENDOSCOPY;  Service: Gastroenterology;;   tooth extracted     with laughing gas    UPPER GASTROINTESTINAL ENDOSCOPY       A IV Location/Drains/Wounds Patient Lines/Drains/Airways Status     Active Line/Drains/Airways     Name Placement date Placement time Site Days   Peripheral IV 01/24/23  20 G 1" Distal;Posterior;Right Forearm 01/24/23  1105  Forearm  less than 1   External Urinary Catheter 01/01/23  1142  --  23            Intake/Output Last 24 hours No intake or output data in the 24 hours ending 01/24/23 1411  Labs/Imaging Results for orders placed or performed during the hospital encounter of 01/24/23 (from the past 48 hour(s))  Ethanol     Status: None   Collection Time: 01/24/23 11:00 AM  Result Value Ref Range   Alcohol, Ethyl (B) <10 <10 mg/dL    Comment: (NOTE) Lowest detectable limit for serum alcohol is 10 mg/dL.  For medical  purposes only. Performed at [facility], [address], [place], [place] [address]   Comprehensive metabolic panel     Status: Abnormal   Collection Time: 01/24/23 11:14 AM  Result Value Ref Range   Sodium 140 135 - 145 mmol/L   Potassium 3.7 3.5 - 5.1 mmol/L   Chloride 109 98 - 111 mmol/L   CO2 21 (L) 22 - 32 mmol/L   Glucose, Bld 94 70 - 99 mg/dL    Comment: Glucose reference range applies only to samples taken after fasting for at least 8 hours.   BUN 16 8 - 23 mg/dL   Creatinine, Ser 8.65 (H) 0.61 - 1.24 mg/dL   Calcium 9.0 8.9 - 78.4 mg/dL   Total Protein 5.5 (L) 6.5 - 8.1 g/dL   Albumin 3.4 (L) 3.5 - 5.0 g/dL   AST 29 15 - 41 U/L   ALT 22 0 - 44 U/L   Alkaline Phosphatase 88 38 - 126 U/L   Total Bilirubin 1.2 0.3 - 1.2 mg/dL   GFR, Estimated 57 (L) >60 mL/min    Comment: (NOTE) Calculated using the CKD-EPI Creatinine Equation (2021)    Anion gap 10 5 - 15    Comment: Performed at [facility], [address], [place], [place] [address]  CBC with Differential     Status: Abnormal   Collection Time: 01/24/23 11:14 AM  Result Value Ref Range   WBC 4.8 4.0 - 10.5 K/uL   RBC 3.84 (L) 4.22 - 5.81 MIL/uL   Hemoglobin 12.4 (L) 13.0 - 17.0 g/dL   HCT 52.8 41.3 - 24.4 %   MCV 102.6 (H) 80.0 - 100.0 fL   MCH 32.3 26.0 - 34.0 pg   MCHC 31.5 30.0 - 36.0 g/dL   RDW 01.0 27.2 - 53.6 %   Platelets 150 150 - 400 K/uL   nRBC 0.0 0.0 - 0.2 %   Neutrophils Relative % 50 %   Neutro Abs 2.3 1.7 - 7.7 K/uL   Lymphocytes Relative 41 %   Lymphs Abs 2.0 0.7 - 4.0 K/uL   Monocytes Relative 9 %   Monocytes Absolute 0.4 0.1 - 1.0 K/uL   Eosinophils Relative 0 %   Eosinophils Absolute 0.0 0.0 - 0.5 K/uL   Basophils Relative 0 %   Basophils Absolute 0.0 0.0 - 0.1 K/uL   Immature Granulocytes 0 %   Abs Immature Granulocytes 0.01 0.00 - 0.07 K/uL    Comment: Performed at [facility], [address] [place], [place] [address]  Troponin I (High Sensitivity)     Status:  None   Collection Time: 01/24/23 11:14 AM  Result Value Ref Range   Troponin I (High Sensitivity) 15 <18 ng/L    Comment: (NOTE) Elevated high sensitivity troponin I (hsTnI)  values and significant  changes across serial measurements may suggest ACS but many other  chronic and acute conditions are known to elevate hsTnI results.  Refer to the "Links" section for chest pain algorithms and additional  guidance. Performed at [facility], [address], [place], [place] [address]   I-stat chem 8, ED     Status: Abnormal   Collection Time: 01/24/23 11:26 AM  Result Value Ref Range   Sodium 140 135 - 145 mmol/L   Potassium 3.7 3.5 - 5.1 mmol/L   Chloride 108 98 - 111 mmol/L   BUN 18 8 - 23 mg/dL   Creatinine, Ser 6.96 0.61 - 1.24 mg/dL   Glucose, Bld 95 70 - 99 mg/dL    Comment: Glucose reference range applies only to samples taken after fasting for at least 8 hours.   Calcium, Ion 1.08 (L) 1.15 - 1.40 mmol/L   TCO2 23 22 - 32 mmol/L   Hemoglobin 12.2 (L) 13.0 - 17.0 g/dL   HCT 29.5 (L) 28.4 - 13.2 %   CT Head Wo Contrast  Result Date: 01/24/2023 CLINICAL DATA:  Head trauma, minor (Age >= 65y); Neck trauma (Age >= 65y). EXAM: CT HEAD WITHOUT CONTRAST CT CERVICAL SPINE WITHOUT CONTRAST TECHNIQUE: Multidetector CT imaging of the head and cervical spine was performed following the standard protocol without intravenous contrast. Multiplanar CT image reconstructions of the cervical spine were also generated. RADIATION DOSE REDUCTION: This exam was performed according to the departmental dose-optimization program which includes automated exposure control, adjustment of the mA and/or kV according to patient size and/or use of iterative reconstruction technique. COMPARISON:  Head CT 12/25/2022. FINDINGS: CT HEAD FINDINGS Brain: No acute hemorrhage. 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 Cortical  gray-white differentiation is otherwise preserved. No hydrocephalus or extra-axial collection. No mass effect or midline shift. Vascular: No hyperdense vessel or unexpected calcification. Skull: No calvarial fracture or suspicious bone lesion. Skull base is unremarkable. Sinuses/Orbits: Mild mucosal disease in the maxillary sinuses. Orbits and mastoids are unremarkable. Other: None. CT CERVICAL SPINE FINDINGS Alignment: Normal. Skull base and vertebrae: No acute fracture. Normal craniocervical junction. No suspicious bone lesions. Soft tissues and spinal canal: No prevertebral fluid or swelling. No visible canal hematoma. Disc levels: Multilevel cervical spondylosis with at least mild spinal canal stenosis from C3-4 to C6-7. Upper chest: Unremarkable. Other: Atherosclerotic calcifications of the carotid bulbs. IMPRESSION: 1. No acute intracranial abnormality. Unchanged mild chronic small-vessel disease and age advanced volume loss. 2. No acute fracture or traumatic listhesis in the cervical spine. Multilevel cervical spondylosis with at least mild spinal canal stenosis from C3-4 to C6-7. Electronically Signed   By: Orvan Falconer M.D.   On: 01/24/2023 12:52   CT Cervical Spine Wo Contrast  Result Date: 01/24/2023 CLINICAL DATA:  Head trauma, minor (Age >= 65y); Neck trauma (Age >= 65y). EXAM: CT HEAD WITHOUT CONTRAST CT CERVICAL SPINE WITHOUT CONTRAST TECHNIQUE: Multidetector CT imaging of the head and cervical spine was performed following the standard protocol without intravenous contrast. Multiplanar CT image reconstructions of the cervical spine were also generated. RADIATION DOSE REDUCTION: This exam was performed according to the departmental dose-optimization program which includes automated exposure control, adjustment of the mA and/or kV according to patient size and/or use of iterative reconstruction technique. COMPARISON:  Head CT 12/25/2022. FINDINGS: CT HEAD FINDINGS Brain: No acute hemorrhage.  Unchanged mild chronic small-vessel disease with probable old lacunar  infarct in the right thalamus and age advanced volume loss. Cortical gray-white differentiation is otherwise preserved. No hydrocephalus or extra-axial collection. No mass effect or midline shift. Vascular: No hyperdense vessel or unexpected calcification. Skull: No calvarial fracture or suspicious bone lesion. Skull base is unremarkable. Sinuses/Orbits: Mild mucosal disease in the maxillary sinuses. Orbits and mastoids are unremarkable. Other: None. CT CERVICAL SPINE FINDINGS Alignment: Normal. Skull base and vertebrae: No acute fracture. Normal craniocervical junction. No suspicious bone lesions. Soft tissues and spinal canal: No prevertebral fluid or swelling. No visible canal hematoma. Disc levels: Multilevel cervical spondylosis with at least mild spinal canal stenosis from C3-4 to C6-7. Upper chest: Unremarkable. Other: Atherosclerotic calcifications of the carotid bulbs. IMPRESSION: 1. No acute intracranial abnormality. Unchanged mild chronic small-vessel disease and age advanced volume loss. 2. No acute fracture or traumatic listhesis in the cervical spine. Multilevel cervical spondylosis with at least mild spinal canal stenosis from C3-4 to C6-7. Electronically Signed   By: Orvan Falconer M.D.   On: 01/24/2023 12:52   CT ABDOMEN PELVIS W CONTRAST  Result Date: 01/24/2023 CLINICAL DATA:  Abdominal trauma, blunt. EXAM: CT ABDOMEN AND PELVIS WITH CONTRAST TECHNIQUE: Multidetector CT imaging of the abdomen and pelvis was performed using the standard protocol following bolus administration of intravenous contrast. RADIATION DOSE REDUCTION: This exam was performed according to the departmental dose-optimization program which includes automated exposure control, adjustment of the mA and/or kV according to patient size and/or use of iterative reconstruction technique. CONTRAST:  75mL OMNIPAQUE IOHEXOL 350 MG/ML SOLN COMPARISON:  12/15/2022  FINDINGS: Lower chest: Residual subsegmental atelectasis noted in the lung bases. The small pleural effusions have resolved in the interval. Hepatobiliary: No focal liver abnormality is seen. Status post cholecystectomy. No biliary dilatation. Pancreas: Unremarkable. No pancreatic ductal dilatation or surrounding inflammatory changes. Spleen: Normal in size without focal abnormality. Adrenals/Urinary Tract: Adrenal glands are unremarkable. Kidneys are normal, without renal calculi, focal lesion, or hydronephrosis. Bladder is unremarkable. Stomach/Bowel: Stomach appears normal. No pathologic dilatation of the large or small bowel loops. No bowel wall thickening or inflammation. Signs of recent small bowel resection with enteroenteric anastomosis within the right upper quadrant of the abdomen. No signs of anastomotic dehiscence. Mild surrounding soft tissue stranding is favored to represent residual postoperative inflammation. Postsurgical changes from remote APR and appendectomy. Vascular/Lymphatic: Aortic atherosclerosis. No signs of abdominopelvic adenopathy. Reproductive: Prostate is unremarkable. Other: Previous bilateral inguinal herniorrhaphy. There is no signs of free fluid or fluid collections. No pneumoperitoneum. Postsurgical changes identified along the midline of the ventral abdominal wall. Musculoskeletal: Degenerative disc disease is noted at L5-S1. Mild first degree anterolisthesis of L5 on S1 noted. No acute or suspicious osseous findings. IMPRESSION: 1. No acute findings identified within the abdomen or pelvis. 2. Signs of recent small bowel resection with enteroenteric anastomosis within the right upper quadrant of the abdomen. No signs of anastomotic dehiscence. Mild surrounding soft tissue stranding is favored to represent residual postoperative inflammation. 3. Interval resolution of small pleural effusions. 4.  Aortic Atherosclerosis (ICD10-I70.0). Electronically Signed   By: Signa Kell M.D.    On: 01/24/2023 12:52   DG Chest Port 1 View  Result Date: 01/24/2023 CLINICAL DATA:  Syncope. EXAM: PORTABLE CHEST 1 VIEW COMPARISON:  12/21/2022 FINDINGS: The lungs are clear without focal pneumonia, edema, pneumothorax or pleural effusion. Streaky opacity in the left base suggest atelectasis or scarring. The cardiopericardial silhouette is within normal limits for size. Left-sided permanent pacemaker noted. Telemetry leads overlie the chest. IMPRESSION: Streaky left  basilar opacity suggests atelectasis or scarring. Otherwise no acute cardiopulmonary findings. Electronically Signed   By: Kennith Center M.D.   On: 01/24/2023 12:25    Pending Labs Unresulted Labs (From admission, onward)     Start     Ordered   01/24/23 1256  Magnesium  Once,   STAT        01/24/23 1256            Vitals/Pain Today's Vitals   01/24/23 1215 01/24/23 1230 01/24/23 1300 01/24/23 1330  BP: 109/67 113/65 (!) 113/96 107/66  Pulse: 61 82 79 (!) 51  Resp: 18 20 15 16   Temp:      TempSrc:      SpO2: 100% 100% 100% 99%  Weight:      Height:      PainSc:        Isolation Precautions No active isolations  Medications Medications  diltiazem (CARDIZEM) 1 mg/mL load via infusion 15 mg (15 mg Intravenous Bolus from Bag 01/24/23 1136)    And  diltiazem (CARDIZEM) 125 mg in dextrose 5% 125 mL (1 mg/mL) infusion (5 mg/hr Intravenous New Bag/Given 01/24/23 1136)  lactated ringers bolus 1,000 mL (0 mLs Intravenous Stopped 01/24/23 1352)  HYDROcodone-acetaminophen (NORCO/VICODIN) 5-325 MG per tablet 1 tablet (1 tablet Oral Given 01/24/23 1217)  iohexol (OMNIPAQUE) 350 MG/ML injection 75 mL (75 mLs Intravenous Contrast Given 01/24/23 1215)  potassium chloride SA (KLOR-CON M) CR tablet 40 mEq (40 mEq Oral Given 01/24/23 1352)    Mobility walks     Focused Assessments Cardio    R Recommendations: See Admitting Provider Note  Report given to:   Additional Notes:

## 2023-01-25 ENCOUNTER — Observation Stay (HOSPITAL_COMMUNITY): Payer: Medicare PPO

## 2023-01-25 ENCOUNTER — Other Ambulatory Visit (HOSPITAL_COMMUNITY): Payer: Medicare PPO

## 2023-01-25 DIAGNOSIS — K219 Gastro-esophageal reflux disease without esophagitis: Secondary | ICD-10-CM | POA: Diagnosis present

## 2023-01-25 DIAGNOSIS — I951 Orthostatic hypotension: Secondary | ICD-10-CM | POA: Diagnosis present

## 2023-01-25 DIAGNOSIS — R0781 Pleurodynia: Secondary | ICD-10-CM | POA: Diagnosis present

## 2023-01-25 DIAGNOSIS — I10 Essential (primary) hypertension: Secondary | ICD-10-CM | POA: Diagnosis present

## 2023-01-25 DIAGNOSIS — R55 Syncope and collapse: Secondary | ICD-10-CM

## 2023-01-25 DIAGNOSIS — Z856 Personal history of leukemia: Secondary | ICD-10-CM | POA: Diagnosis not present

## 2023-01-25 DIAGNOSIS — Z7901 Long term (current) use of anticoagulants: Secondary | ICD-10-CM | POA: Diagnosis not present

## 2023-01-25 DIAGNOSIS — Z9049 Acquired absence of other specified parts of digestive tract: Secondary | ICD-10-CM | POA: Diagnosis not present

## 2023-01-25 DIAGNOSIS — I495 Sick sinus syndrome: Secondary | ICD-10-CM | POA: Diagnosis present

## 2023-01-25 DIAGNOSIS — N179 Acute kidney failure, unspecified: Secondary | ICD-10-CM | POA: Diagnosis present

## 2023-01-25 DIAGNOSIS — F32A Depression, unspecified: Secondary | ICD-10-CM | POA: Diagnosis present

## 2023-01-25 DIAGNOSIS — Z8042 Family history of malignant neoplasm of prostate: Secondary | ICD-10-CM | POA: Diagnosis not present

## 2023-01-25 DIAGNOSIS — D61818 Other pancytopenia: Secondary | ICD-10-CM | POA: Diagnosis present

## 2023-01-25 DIAGNOSIS — R262 Difficulty in walking, not elsewhere classified: Secondary | ICD-10-CM | POA: Diagnosis present

## 2023-01-25 DIAGNOSIS — R569 Unspecified convulsions: Secondary | ICD-10-CM

## 2023-01-25 DIAGNOSIS — Z91138 Patient's unintentional underdosing of medication regimen for other reason: Secondary | ICD-10-CM | POA: Diagnosis not present

## 2023-01-25 DIAGNOSIS — T45516A Underdosing of anticoagulants, initial encounter: Secondary | ICD-10-CM | POA: Diagnosis present

## 2023-01-25 DIAGNOSIS — Z888 Allergy status to other drugs, medicaments and biological substances status: Secondary | ICD-10-CM | POA: Diagnosis not present

## 2023-01-25 DIAGNOSIS — Z79899 Other long term (current) drug therapy: Secondary | ICD-10-CM | POA: Diagnosis not present

## 2023-01-25 DIAGNOSIS — I480 Paroxysmal atrial fibrillation: Secondary | ICD-10-CM | POA: Diagnosis present

## 2023-01-25 DIAGNOSIS — M542 Cervicalgia: Secondary | ICD-10-CM | POA: Diagnosis present

## 2023-01-25 DIAGNOSIS — Z950 Presence of cardiac pacemaker: Secondary | ICD-10-CM | POA: Diagnosis not present

## 2023-01-25 DIAGNOSIS — M1990 Unspecified osteoarthritis, unspecified site: Secondary | ICD-10-CM | POA: Diagnosis present

## 2023-01-25 DIAGNOSIS — F0393 Unspecified dementia, unspecified severity, with mood disturbance: Secondary | ICD-10-CM | POA: Diagnosis present

## 2023-01-25 DIAGNOSIS — J45909 Unspecified asthma, uncomplicated: Secondary | ICD-10-CM | POA: Diagnosis present

## 2023-01-25 DIAGNOSIS — G4733 Obstructive sleep apnea (adult) (pediatric): Secondary | ICD-10-CM | POA: Diagnosis present

## 2023-01-25 LAB — COMPREHENSIVE METABOLIC PANEL
ALT: 19 U/L (ref 0–44)
AST: 18 U/L (ref 15–41)
Albumin: 2.8 g/dL — ABNORMAL LOW (ref 3.5–5.0)
Alkaline Phosphatase: 75 U/L (ref 38–126)
Anion gap: 13 (ref 5–15)
BUN: 13 mg/dL (ref 8–23)
CO2: 22 mmol/L (ref 22–32)
Calcium: 8.5 mg/dL — ABNORMAL LOW (ref 8.9–10.3)
Chloride: 106 mmol/L (ref 98–111)
Creatinine, Ser: 1.18 mg/dL (ref 0.61–1.24)
GFR, Estimated: >60 mL/min (ref >60)
Glucose, Bld: 96 mg/dL (ref 70–99)
Potassium: 3.7 mmol/L (ref 3.5–5.1)
Sodium: 141 mmol/L (ref 135–145)
Total Bilirubin: 0.8 mg/dL (ref 0.3–1.2)
Total Protein: 4.5 g/dL — ABNORMAL LOW (ref 6.5–8.1)

## 2023-01-25 LAB — CBC
HCT: 32.2 % — ABNORMAL LOW (ref 39.0–52.0)
Hemoglobin: 10.4 g/dL — ABNORMAL LOW (ref 13.0–17.0)
MCH: 31 pg (ref 26.0–34.0)
MCHC: 32.3 g/dL (ref 30.0–36.0)
MCV: 96.1 fL (ref 80.0–100.0)
Platelets: 140 10*3/uL — ABNORMAL LOW (ref 150–400)
RBC: 3.35 MIL/uL — ABNORMAL LOW (ref 4.22–5.81)
RDW: 15 % (ref 11.5–15.5)
WBC: 3.9 10*3/uL — ABNORMAL LOW (ref 4.0–10.5)
nRBC: 0 % (ref 0.0–0.2)

## 2023-01-25 NOTE — TOC Initial Note (Addendum)
Transition of Care [facility]) - Initial/Assessment Note    Patient Details  Name: Jonathon Parker [facility] MRN: [id number] Date of Birth: May 26, 1951  Transition of Care [facility]) CM/SW Contact:    Mearl Latin, LCSW Phone Number: 01/25/2023, 1:01 PM  Clinical Narrative:                 10:30am-CSW attempted to see patient but he needed to go to the bathroom. Tech aware. CSW will check back in.  12:45pm-CSW spoke with patient at bedside who was admitted from [facility]. Patient reported that he was recently released from [facility] after insurance cut him off after about two weeks. CSW discussed insurance authorization process and will provide Medicare SNF ratings list. Patient informed that he has not had a 60 day wellness period so he will not have many of his 20 Medicare covered days left and will then go into out of pocket copays. He attempted to call his wife to ask her what the plan should be but it went to voicemail. CSW will send out referrals for review and provide bed offers as available. Pasrr pending. If insurance denies SNF, may need to arrange home health therapies at ALF.    Skilled Nursing Rehab Facilities-   [email or web address]   Ratings out of 5 stars (5 the highest)   Name Address  Phone # Quality Care Staffing Health Inspection Overall  [facility] [address] [phone] 2 1 5 4   [facility] [address] [place] [phone] 4 1 3 2   [facility] [address], [place]inette Otto [phone] 2 1 1 1   [facility] [address] [place] [phone] 4 1 3 2   [facility]  [address], [place] [phone] 3 2 5 5   [facility] [address] [facility] [phone] 2 1 2 1   [facility] [address], [place] [phone] 4 1 2 1   [facility] [address] [place] [phone] 2 4 3 3   [facility] ([facility]) [address]  [place] [phone] 3 2 2 2   [facility] [address] [place], [place] [phone] 1 2 1 1   [facility] ([place]) [address], [place] [phone] 3 1 1 1   [facility] [address] Liliane Shi [phone] 4 3 4 4   [facility] [address], [place] [phone] 3 4 3 3           [facility] [address] [place] [phone]      [facility], [place] [address], [place] [phone] 1 1 2 1   [facility] [address], [place] [phone] 2 2 4 4   [facility] [employer] [address] Cheree Ditto [phone] 2 1 4 3   [facility] [address] [place] [phone] 3 3 3 3           [address] (no [facility]) [address], [place] [phone] 4 4 5 5   [facility] (No [employer]) [address] Lavera Guise [phone] 2 2 4 4   [facility] [address], [place] [phone] 2 1 2 1   [facility] (No [employer]) [address], [place] [phone] 5 5 5 5   [facility] [address] [facility] [phone] 2 3 5 5   [facility] [address] [place] [phone] 2 1 1 1   [facility] [address] [place] Shi [phone] 5 2 5 5   [facility]  [address], [place] [phone]  2 2 2 2   [facility] [address], [place] [phone] 4 2 1 1   [facility] [address] [place], [place] [phone] 2 2 3 3           [facility] [address] [place] [phone] 1 1 1 1   [facility] [address], Evlyn Clines  [phone] 2 3 3 3   [facility] (No Humana) [address], [place] [phone] 2 1 3 2   [facility]) [address], Rosalita Levan [phone] 1 1 1 1   [facility] [address] [place] Levan [phone] 3 2 5 5   [facility] [address], [place] [phone] 2 1 1 1           [facility] [address] [place], [place] [phone] 4 4 5 5   [facility])  [address] [place] [phone] 2 1 2 1   [facility]) [address], [place] [phone]  1 4 3   [facility] [place] [address] [place] [phone] 3 5 4 5   [address] [place], [place] [phone] 3 2 2 2   [facility]) [address] [place] [phone] 2 1 3 2      Expected Discharge Plan: Skilled Nursing Facility Barriers to Discharge: Continued Medical Work up, English as a second language teacher, SNF Pending bed offer   Patient Goals and CMS Choice Patient states their goals for this hospitalization and ongoing recovery are:: Rehab CMS Medicare.gov Compare Post Acute Care list provided to:: Patient Choice offered to / list presented to : Patient [place] ownership interest in [facility].provided to:: Patient    Expected Discharge Plan and Services In-house Referral: Clinical Social Work   Post Acute Care Choice: Skilled Nursing Facility Living arrangements for the past 2 months: Assisted Living Facility                                      Prior Living Arrangements/Services Living arrangements for the past 2 months: Assisted Living Facility Lives with:: Spouse Patient language and need for interpreter reviewed:: Yes Do you feel safe going back to the place where you live?: Yes      Need for Family Participation in Patient Care: No (Comment) Care giver support system in place?: Yes (comment)   Criminal Activity/Legal Involvement Pertinent to Current Situation/Hospitalization: No - Comment as needed  Activities of Daily Living   ADL Screening (condition at time of admission) Patient's cognitive ability adequate to safely complete daily activities?: Yes Is the patient deaf or have difficulty hearing?: No Does the patient have difficulty seeing, even when wearing glasses/contacts?: No Does the patient have difficulty concentrating, remembering, or making  decisions?: Yes Patient able to express need for assistance with ADLs?: Yes Does the patient have difficulty dressing or bathing?: Yes Independently performs ADLs?: No Communication: Independent Dressing (OT): Needs assistance Is this a change from baseline?: Pre-admission baseline Grooming: Independent Feeding: Independent Bathing: Needs assistance Is this a change from baseline?: Pre-admission baseline Toileting: Needs assistance Is this a change from baseline?: Pre-admission baseline In/Out Bed: Dependent Is this a change from baseline?: Pre-admission baseline Walks in Home: Needs assistance Is this a change from baseline?: Pre-admission baseline Does the patient have difficulty walking or climbing stairs?: Yes Weakness of Legs: Both Weakness of Arms/Hands: Both  Permission Sought/Granted Permission sought to share information with : Magazine features editor,  Family Supports Permission granted to share information with : Yes, Verbal Permission Granted     Permission granted to share info w AGENCY: SNFS  Permission granted to share info w Relationship: Spouse     Emotional Assessment Appearance:: Appears stated age Attitude/Demeanor/Rapport: Engaged Affect (typically observed): Accepting, Appropriate Orientation: : Oriented to Self, Oriented to Place, Oriented to  Time, Oriented to Situation Alcohol / Substance Use: Not Applicable Psych Involvement: No (comment)  Admission diagnosis:  Syncope and collapse [R55] Atrial fibrillation with rapid ventricular response (HCC) [I48.91] Syncope, unspecified syncope type [R55] Patient Active Problem List   Diagnosis Date Noted   Syncope and collapse 01/24/2023   AKI (acute kidney injury) (HCC) 01/24/2023   Paroxysmal atrial fibrillation (HCC) 01/24/2023   Macrocytic anemia 01/24/2023   Bowel perforation (HCC) 01/24/2023   Dementia without behavioral disturbance (HCC) 01/24/2023   Status post placement of cardiac  pacemaker 01/24/2023   Palliative care by specialist 12/29/2022   Malnutrition of moderate degree 12/18/2022   Enteritis 12/15/2022   Acute ischemic VBA thalamic stroke, right (HCC) 11/24/2020   Mild intermittent asthma 01/28/2020   Dyspnea 05/11/2019   Thrombocytosis 05/11/2019   Alcohol dependence (HCC) 05/11/2019   Abnormal findings on diagnostic imaging of gall bladder 05/10/2019   Essential hypertension 04/22/2019   Post-operative complication 04/21/2019   Acute respiratory failure with hypoxia (HCC) 04/21/2019   SIRS (systemic inflammatory response syndrome) (HCC) 04/21/2019   Status post laparoscopic cholecystectomy 04/21/2019   HCAP (healthcare-associated pneumonia) 04/21/2019   CLL (chronic lymphocytic leukemia) (HCC) 04/21/2019   Calculous cholecystitis 02/28/2019   Campylobacter diarrhea    Diarrhea of infectious origin    STEC (Shiga toxin-producing Escherichia coli)    Elevated LFTs    Hypokalemia    Gastroenteritis 01/19/2017   Sepsis (HCC) 01/19/2017   GERD (gastroesophageal reflux disease) 01/19/2017   Depression 01/19/2017   PCP:  Paulina Fusi, MD Pharmacy:   [address] [place], [place] - [address] [place] [address] Phone: [phone] Fax: [phone]  [id number] [place] Levan, [place] - [address] AT [facility] [address] [place] [address] Phone: [phone] Fax: [phone]     Social Determinants of Health (SDOH) Social History: SDOH Screenings   Food Insecurity: No Food Insecurity (01/25/2023)  Housing: Low Risk  (01/25/2023)  Transportation Needs: No Transportation Needs (01/25/2023)  Utilities: Not At Risk (01/25/2023)  Tobacco Use: Low Risk  (01/24/2023)   SDOH Interventions:     Readmission Risk Interventions     No data to display

## 2023-01-25 NOTE — Progress Notes (Signed)
   01/25/23 0500  BiPAP/CPAP/SIPAP  BiPAP/CPAP/SIPAP Pt Type Adult  BiPAP/CPAP/SIPAP DREAMSTATIOND  Reason BIPAP/CPAP not in use Other(comment) (Pt refused, machine in room)

## 2023-01-25 NOTE — Progress Notes (Signed)
PROGRESS NOTE    Jonathon Parker [facility]  VVO:[id number] DOB: [birth year] DOA: 01/24/2023 PCP: Paulina Fusi, MD   Brief Narrative:  Jonathon Parker is a 72 y.o. male with medical history significant of  history of CLL, paroxysmal A-fib on Eliquis, tachybradycardia syndrome  s/p PPM, history of colon cancer s/p hemicolectomy more than 10 years ago, GERD who presents after having a fall yesterday while in the shower with suspected syncope/collapse after prodrome of lightheadedness/dizziness.  Hospitalist called for admission.  Assessment & Plan:   Principal Problem:   Syncope and collapse Active Problems:   Paroxysmal atrial fibrillation (HCC)   Status post placement of cardiac pacemaker   AKI (acute kidney injury) (HCC)   Macrocytic anemia   Essential hypertension   Bowel perforation (HCC)   Depression   Dementia without behavioral disturbance (HCC)   GERD (gastroesophageal reflux disease)  Suspected syncope and collapse Likely orthostatic hypotension in etiology, POA -Prodrome of lightheadedness prior to falling before admission duplicated today with orthostatic vital signs which were markedly positive. -CT head negative for acute process -Seizure unlikely, EEG within normal limits per neurology -Continue IV fluids, encouraged to increase p.o. intake, follow PT OT recommendations.   Paroxysmal atrial fibrillation on chronic anticoagulation Tachybradycardia syndrome s/p PPM -A-fib with transient RVR at intake likely secondary to orthostatic hypotension -Status post permanent pacemaker April 2024 -Continue Eliquis -Initially requiring Cardizem drip, weaned off continue p.o. metoprolol -Pacemaker interrogation per protocol  Acute kidney injury - resolving -Baseline creatinine 0.9  -Creatinine elevated 1.3 at intake, downtrending appropriately -Continues on IV fluid given above   Macrocytic anemia Chronic.  No acute indication for treatment, followed labs as  appropriate.   Essential hypertension Well-controlled, continue metoprolol   History of bowel perforation, recent Patient hospitalized last month requiring explorative laparotomy with lysis of adhesions, small bowel resection with primary anastomosis for contained perforation of the jejunal diverticulum   Depression -Continue Wellbutrin   Dementia, without mood disturbances -Alert and oriented to person place and situation. -Continue Aricept   GERD -Continue Protonix and Carafate   OSA on CPAP -Continue CPAP at night  DVT prophylaxis: apixaban (ELIQUIS) tablet 5 mg  Code Status:   Code Status: Full Code Family Communication: None present  Status is: Inpt  Dispo: The patient is from: Home              Anticipated d/c is to: Home              Anticipated d/c date is: 24 to 48 hours pending clinical course              Patient currently not medically stable for discharge given ongoing orthostatic symptoms, high risk for falls requiring safe disposition and further PT and OT evaluation and treatment.  Consultants:  None  Procedures:  None  Antimicrobials:  None indicated  Subjective: No acute issues or events overnight denies nausea vomiting diarrhea constipation headache fevers chills or chest pain.  Patient does endorse lightheadedness, dizziness when standing earlier for orthostatic vital signs.  Objective: Vitals:   01/25/23 0000 01/25/23 0100 01/25/23 0200 01/25/23 0608  BP:    91/62  Pulse:    76  Resp: 18 (!) 21 20 15   Temp:    98.5 F (36.9 C)  TempSrc:    Oral  SpO2: 97% 97% 96% 95%  Weight:      Height:        Intake/Output Summary (Last 24 hours) at 01/25/2023 0719 Last data filed  at 01/25/2023 0300 Gross per 24 hour  Intake 484.1 ml  Output --  Net 484.1 ml   Filed Weights   01/24/23 1058  Weight: 63.5 kg    Examination:  General:  Pleasantly resting in bed, No acute distress. HEENT:  Normocephalic atraumatic.  Sclerae nonicteric,  noninjected.  Extraocular movements intact bilaterally. Neck:  Without mass or deformity.  Trachea is midline. Lungs:  Clear to auscultate bilaterally without rhonchi, wheeze, or rales. Heart:  Regular rate and rhythm.  Without murmurs, rubs, or gallops. Abdomen:  Soft, nontender, nondistended.  Without guarding or rebound. Extremities: Without cyanosis, clubbing, edema, or obvious deformity. Skin:  Warm and dry, no erythema   Data Reviewed: I have personally reviewed following labs and imaging studies  CBC: Recent Labs  Lab 01/24/23 1114 01/24/23 1126 01/25/23 0252  WBC 4.8  --  3.9*  NEUTROABS 2.3  --   --   HGB 12.4* 12.2* 10.4*  HCT 39.4 36.0* 32.2*  MCV 102.6*  --  96.1  PLT 150  --  140*   Basic Metabolic Panel: Recent Labs  Lab 01/24/23 1114 01/24/23 1126 01/24/23 1352 01/25/23 0252  NA 140 140  --  141  K 3.7 3.7  --  3.7  CL 109 108  --  106  CO2 21*  --   --  22  GLUCOSE 94 95  --  96  BUN 16 18  --  13  CREATININE 1.33* 1.20  --  1.18  CALCIUM 9.0  --   --  8.5*  MG  --   --  1.9  --    GFR: Estimated Creatinine Clearance: 50.8 mL/min (by C-G formula based on SCr of 1.18 mg/dL). Liver Function Tests: Recent Labs  Lab 01/24/23 1114 01/25/23 0252  AST 29 18  ALT 22 19  ALKPHOS 88 75  BILITOT 1.2 0.8  PROT 5.5* 4.5*  ALBUMIN 3.4* 2.8*   No results for input(s): "LIPASE", "AMYLASE" in the last 168 hours. No results for input(s): "AMMONIA" in the last 168 hours. Coagulation Profile: No results for input(s): "INR", "PROTIME" in the last 168 hours. Cardiac Enzymes: No results for input(s): "CKTOTAL", "CKMB", "CKMBINDEX", "TROPONINI" in the last 168 hours. BNP (last 3 results) No results for input(s): "PROBNP" in the last 8760 hours. HbA1C: No results for input(s): "HGBA1C" in the last 72 hours. CBG: No results for input(s): "GLUCAP" in the last 168 hours. Lipid Profile: No results for input(s): "CHOL", "HDL", "LDLCALC", "TRIG", "CHOLHDL",  "LDLDIRECT" in the last 72 hours. Thyroid Function Tests: No results for input(s): "TSH", "T4TOTAL", "FREET4", "T3FREE", "THYROIDAB" in the last 72 hours. Anemia Panel: No results for input(s): "VITAMINB12", "FOLATE", "FERRITIN", "TIBC", "IRON", "RETICCTPCT" in the last 72 hours. Sepsis Labs: No results for input(s): "PROCALCITON", "LATICACIDVEN" in the last 168 hours.  No results found for this or any previous visit (from the past 240 hour(s)).       Radiology Studies: CT Head Wo Contrast  Result Date: 01/24/2023 CLINICAL DATA:  Head trauma, minor (Age >= 65y); Neck trauma (Age >= 65y). EXAM: CT HEAD WITHOUT CONTRAST CT CERVICAL SPINE WITHOUT CONTRAST TECHNIQUE: Multidetector CT imaging of the head and cervical spine was performed following the standard protocol without intravenous contrast. Multiplanar CT image reconstructions of the cervical spine were also generated. RADIATION DOSE REDUCTION: This exam was performed according to the departmental dose-optimization program which includes automated exposure control, adjustment of the mA and/or kV according to patient size and/or use of iterative reconstruction  technique. COMPARISON:  Head CT 12/25/2022. FINDINGS: CT HEAD FINDINGS Brain: No acute hemorrhage. 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 Cortical gray-white differentiation is otherwise preserved. No hydrocephalus or extra-axial collection. No mass effect or midline shift. Vascular: No hyperdense vessel or unexpected calcification. Skull: No calvarial fracture or suspicious bone lesion. Skull base is unremarkable. Sinuses/Orbits: Mild mucosal disease in the maxillary sinuses. Orbits and mastoids are unremarkable. Other: None. CT CERVICAL SPINE FINDINGS Alignment: Normal. Skull base and vertebrae: No acute fracture. Normal craniocervical junction. No suspicious bone lesions. Soft tissues and spinal  canal: No prevertebral fluid or swelling. No visible canal hematoma. Disc levels: Multilevel cervical spondylosis with at least mild spinal canal stenosis from C3-4 to C6-7. Upper chest: Unremarkable. Other: Atherosclerotic calcifications of the carotid bulbs. IMPRESSION: 1. No acute intracranial abnormality. Unchanged mild chronic small-vessel disease and age advanced volume loss. 2. No acute fracture or traumatic listhesis in the cervical spine. Multilevel cervical spondylosis with at least mild spinal canal stenosis from C3-4 to C6-7. Electronically Signed   By: Orvan Falconer M.D.   On: 01/24/2023 12:52   CT Cervical Spine Wo Contrast  Result Date: 01/24/2023 CLINICAL DATA:  Head trauma, minor (Age >= 65y); Neck trauma (Age >= 65y). EXAM: CT HEAD WITHOUT CONTRAST CT CERVICAL SPINE WITHOUT CONTRAST TECHNIQUE: Multidetector CT imaging of the head and cervical spine was performed following the standard protocol without intravenous contrast. Multiplanar CT image reconstructions of the cervical spine were also generated. RADIATION DOSE REDUCTION: This exam was performed according to the departmental dose-optimization program which includes automated exposure control, adjustment of the mA and/or kV according to patient size and/or use of iterative reconstruction technique. COMPARISON:  Head CT 12/25/2022. FINDINGS: CT HEAD FINDINGS Brain: No acute hemorrhage. 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 Cortical gray-white differentiation is otherwise preserved. No hydrocephalus or extra-axial collection. No mass effect or midline shift. Vascular: No hyperdense vessel or unexpected calcification. Skull: No calvarial fracture or suspicious bone lesion. Skull base is unremarkable. Sinuses/Orbits: Mild mucosal disease in the maxillary sinuses. Orbits and mastoids are unremarkable. Other: None. CT CERVICAL SPINE FINDINGS Alignment:  Normal. Skull base and vertebrae: No acute fracture. Normal craniocervical junction. No suspicious bone lesions. Soft tissues and spinal canal: No prevertebral fluid or swelling. No visible canal hematoma. Disc levels: Multilevel cervical spondylosis with at least mild spinal canal stenosis from C3-4 to C6-7. Upper chest: Unremarkable. Other: Atherosclerotic calcifications of the carotid bulbs. IMPRESSION: 1. No acute intracranial abnormality. Unchanged mild chronic small-vessel disease and age advanced volume loss. 2. No acute fracture or traumatic listhesis in the cervical spine. Multilevel cervical spondylosis with at least mild spinal canal stenosis from C3-4 to C6-7. Electronically Signed   By: Orvan Falconer M.D.   On: 01/24/2023 12:52   CT ABDOMEN PELVIS W CONTRAST  Result Date: 01/24/2023 CLINICAL DATA:  Abdominal trauma, blunt. EXAM: CT ABDOMEN AND PELVIS WITH CONTRAST TECHNIQUE: Multidetector CT imaging of the abdomen and pelvis was performed using the standard protocol following bolus administration of intravenous contrast. RADIATION DOSE REDUCTION: This exam was performed according to the departmental dose-optimization program which includes automated exposure control, adjustment of the mA and/or kV according to patient size and/or use of iterative reconstruction technique. CONTRAST:  75mL OMNIPAQUE IOHEXOL 350 MG/ML SOLN COMPARISON:  12/15/2022 FINDINGS: Lower chest: Residual subsegmental atelectasis noted in the lung bases. The  small pleural effusions have resolved in the interval. Hepatobiliary: No focal liver abnormality is seen. Status post cholecystectomy. No biliary dilatation. Pancreas: Unremarkable. No pancreatic ductal dilatation or surrounding inflammatory changes. Spleen: Normal in size without focal abnormality. Adrenals/Urinary Tract: Adrenal glands are unremarkable. Kidneys are normal, without renal calculi, focal lesion, or hydronephrosis. Bladder is unremarkable. Stomach/Bowel:  Stomach appears normal. No pathologic dilatation of the large or small bowel loops. No bowel wall thickening or inflammation. Signs of recent small bowel resection with enteroenteric anastomosis within the right upper quadrant of the abdomen. No signs of anastomotic dehiscence. Mild surrounding soft tissue stranding is favored to represent residual postoperative inflammation. Postsurgical changes from remote APR and appendectomy. Vascular/Lymphatic: Aortic atherosclerosis. No signs of abdominopelvic adenopathy. Reproductive: Prostate is unremarkable. Other: Previous bilateral inguinal herniorrhaphy. There is no signs of free fluid or fluid collections. No pneumoperitoneum. Postsurgical changes identified along the midline of the ventral abdominal wall. Musculoskeletal: Degenerative disc disease is noted at L5-S1. Mild first degree anterolisthesis of L5 on S1 noted. No acute or suspicious osseous findings. IMPRESSION: 1. No acute findings identified within the abdomen or pelvis. 2. Signs of recent small bowel resection with enteroenteric anastomosis within the right upper quadrant of the abdomen. No signs of anastomotic dehiscence. Mild surrounding soft tissue stranding is favored to represent residual postoperative inflammation. 3. Interval resolution of small pleural effusions. 4.  Aortic Atherosclerosis (ICD10-I70.0). Electronically Signed   By: Signa Kell M.D.   On: 01/24/2023 12:52   DG Chest Port 1 View  Result Date: 01/24/2023 CLINICAL DATA:  Syncope. EXAM: PORTABLE CHEST 1 VIEW COMPARISON:  12/21/2022 FINDINGS: The lungs are clear without focal pneumonia, edema, pneumothorax or pleural effusion. Streaky opacity in the left base suggest atelectasis or scarring. The cardiopericardial silhouette is within normal limits for size. Left-sided permanent pacemaker noted. Telemetry leads overlie the chest. IMPRESSION: Streaky left basilar opacity suggests atelectasis or scarring. Otherwise no acute  cardiopulmonary findings. Electronically Signed   By: Kennith Center M.D.   On: 01/24/2023 12:25    Scheduled Meds:  apixaban  5 mg Oral BID   atorvastatin  20 mg Oral QHS   buPROPion  300 mg Oral Daily   colestipol  1 g Oral QPM   donepezil  5 mg Oral QHS   metoprolol tartrate  75 mg Oral BID   pantoprazole  40 mg Oral Daily   potassium chloride  20 mEq Oral Daily   sodium chloride flush  3 mL Intravenous Q12H   traZODone  100 mg Oral QHS   Continuous Infusions:  sodium chloride 75 mL/hr at 01/25/23 0300     LOS: 0 days   Time spent:  Azucena Fallen, DO [facility]  If 7PM-7AM, please contact night-coverage [email or web address]  01/25/2023, 7:19 AM

## 2023-01-25 NOTE — Progress Notes (Signed)
   01/25/23 2100  BiPAP/CPAP/SIPAP  BiPAP/CPAP/SIPAP Pt Type Adult  BiPAP/CPAP/SIPAP DREAMSTATIOND  Reason BIPAP/CPAP not in use Non-compliant

## 2023-01-25 NOTE — [place] FL2 (Signed)
[place] MEDICAID FL2 LEVEL OF CARE FORM     IDENTIFICATION  Patient Name: Jonathon Parker [facility] Birthdate: Aug 11, 1950 Sex: male Admission Date (Current Location): 01/24/2023  [facility] and [place] Number:  [employer] and Address:  [facility] [place], [address], [place], [place] [address]      Provider Number: [id number]  Attending Physician Name and Address:  Azucena Fallen, MD  Relative Name and Phone Number:  Chismar,Victoria (Spouse) [phone] (Mobile)    Current Level of Care: Hospital Recommended Level of Care: Skilled Nursing Facility Prior Approval Number:    Date Approved/Denied:   PASRR Number: pending  Discharge Plan: SNF    Current Diagnoses: Patient Active Problem List   Diagnosis Date Noted   Syncope and collapse 01/24/2023   AKI (acute kidney injury) (HCC) 01/24/2023   Paroxysmal atrial fibrillation (HCC) 01/24/2023   Macrocytic anemia 01/24/2023   Bowel perforation (HCC) 01/24/2023   Dementia without behavioral disturbance (HCC) 01/24/2023   Status post placement of cardiac pacemaker 01/24/2023   Palliative care by specialist 12/29/2022   Malnutrition of moderate degree 12/18/2022   Enteritis 12/15/2022   Acute ischemic VBA thalamic stroke, right (HCC) 11/24/2020   Mild intermittent asthma 01/28/2020   Dyspnea 05/11/2019   Thrombocytosis 05/11/2019   Alcohol dependence (HCC) 05/11/2019   Abnormal findings on diagnostic imaging of gall bladder 05/10/2019   Essential hypertension 04/22/2019   Post-operative complication 04/21/2019   Acute respiratory failure with hypoxia (HCC) 04/21/2019   SIRS (systemic inflammatory response syndrome) (HCC) 04/21/2019   Status post laparoscopic cholecystectomy 04/21/2019   HCAP (healthcare-associated pneumonia) 04/21/2019   CLL (chronic lymphocytic leukemia) (HCC) 04/21/2019   Calculous cholecystitis 02/28/2019   Campylobacter diarrhea    Diarrhea of infectious origin     STEC (Shiga toxin-producing Escherichia coli)    Elevated LFTs    Hypokalemia    Gastroenteritis 01/19/2017   Sepsis (HCC) 01/19/2017   GERD (gastroesophageal reflux disease) 01/19/2017   Depression 01/19/2017    Orientation RESPIRATION BLADDER Height & Weight     Self, Time, Situation, Place  Normal Incontinent, External catheter Weight: 140 lb (63.5 kg) Height:  6' (182.9 cm)  BEHAVIORAL SYMPTOMS/MOOD NEUROLOGICAL BOWEL NUTRITION STATUS      Continent Diet (See dc summary)  AMBULATORY STATUS COMMUNICATION OF NEEDS Skin   Extensive Assist Verbally Normal                       Personal Care Assistance Level of Assistance  Bathing, Feeding, Dressing Bathing Assistance: Maximum assistance Feeding assistance: Limited assistance Dressing Assistance: Maximum assistance     Functional Limitations Info             SPECIAL CARE FACTORS FREQUENCY  PT (By licensed PT), OT (By licensed OT)     PT Frequency: 5x/week OT Frequency: 5x/week            Contractures Contractures Info: Not present    Additional Factors Info  Code Status, Allergies, Psychotropic Code Status Info: Full Allergies Info: Lisinopril, Seroquel (Quetiapine), Zolpidem Psychotropic Info: Wellbutrin         Current Medications (01/25/2023):  This is the current hospital active medication list Current Facility-Administered Medications  Medication Dose Route Frequency Provider Last Rate Last Admin   0.9 %  sodium chloride infusion   Intravenous Continuous Madelyn Flavors A, MD 75 mL/hr at 01/25/23 0929 New Bag at 01/25/23 0929   acetaminophen (TYLENOL) tablet 650 mg  650 mg Oral  Q6H PRN Clydie Braun, MD       Or   acetaminophen (TYLENOL) suppository 650 mg  650 mg Rectal Q6H PRN Madelyn Flavors A, MD       albuterol (PROVENTIL) (2.5 MG/3ML) 0.083% nebulizer solution 2.5 mg  2.5 mg Nebulization Q6H PRN Madelyn Flavors A, MD       apixaban (ELIQUIS) tablet 5 mg  5 mg Oral BID Katrinka Blazing, Rondell A, MD    5 mg at 01/25/23 0919   atorvastatin (LIPITOR) tablet 20 mg  20 mg Oral QHS Smith, Rondell A, MD   20 mg at 01/24/23 2107   buPROPion (WELLBUTRIN XL) 24 hr tablet 300 mg  300 mg Oral Daily Katrinka Blazing, Rondell A, MD   300 mg at 01/25/23 0919   colestipol (COLESTID) tablet 1 g  1 g Oral QPM Smith, Rondell A, MD   1 g at 01/24/23 1929   donepezil (ARICEPT) tablet 5 mg  5 mg Oral QHS Smith, Rondell A, MD   5 mg at 01/24/23 2108   metoprolol tartrate (LOPRESSOR) tablet 75 mg  75 mg Oral BID Madelyn Flavors A, MD   75 mg at 01/25/23 0919   pantoprazole (PROTONIX) EC tablet 40 mg  40 mg Oral Daily Smith, Rondell A, MD   40 mg at 01/25/23 0919   potassium chloride (KLOR-CON) CR tablet 20 mEq  20 mEq Oral Daily Smith, Rondell A, MD   20 mEq at 01/25/23 0919   sodium chloride flush (NS) 0.9 % injection 3 mL  3 mL Intravenous Q12H Smith, Rondell A, MD   3 mL at 01/25/23 0925   sucralfate (CARAFATE) tablet 1 g  1 g Oral Daily PRN Madelyn Flavors A, MD       traZODone (DESYREL) tablet 100 mg  100 mg Oral QHS Madelyn Flavors A, MD   100 mg at 01/24/23 2108     Discharge Medications: Please see discharge summary for a list of discharge medications.  Relevant Imaging Results:  Relevant Lab Results:   Additional Information [facility] [id number]  Mearl Latin, LCSW

## 2023-01-25 NOTE — Evaluation (Signed)
Occupational Therapy Evaluation Patient Details Name: [facility] MRN: [id number] DOB: 1951/01/20 Today's Date: 01/25/2023   History of Present Illness 72 y.o. male presents to [facility] hospital on 01/24/2023 after possible syncopal episode and fall. Pt found to be in afib with RVR in ED. PMH - HTN, R thalamic CVA, colon CA, CLL, GERD, Depression, enteritis, sepsis, mild cognitive impairment, pacer.   Clinical Impression   Pt admitted for above dx, PTA patient reports ambulating with RW but having assist with standing and assist for bADLs at ALF. Pt currently presenting with generalized weakness and decreased activity tolerance, with c/o light headedness when standing. Pt Mod A for STS and ambulating with min guard, needing cues for upright posture and to maintain head in neutral position, BP low and fluctuating. Pt would benefit from continued acute skilled OT services to address deficits and help transition to next level of care. Patient would benefit from post acute Home OT services to help maximize functional independence in natural environment pending pt progression and if ALF can provide level of assist needed, if not then pt may need SNF.      Recommendations for follow up therapy are one component of a multi-disciplinary discharge planning process, led by the attending physician.  Recommendations may be updated based on patient status, additional functional criteria and insurance authorization.   Assistance Recommended at Discharge Frequent or constant Supervision/Assistance  Patient can return home with the following A lot of help with bathing/dressing/bathroom;Assistance with cooking/housework;Assist for transportation;Assistance with feeding;Help with stairs or ramp for entrance;A lot of help with walking and/or transfers    Functional Status Assessment  Patient has had a recent decline in their functional status and demonstrates the ability to make significant improvements in  function in a reasonable and predictable amount of time.  Equipment Recommendations  BSC/3in1    Recommendations for Other Services       Precautions / Restrictions Precautions Precautions: Fall Precaution Comments: watch HR and BP Restrictions Weight Bearing Restrictions: No      Mobility Bed Mobility Overal bed mobility: Needs Assistance Bed Mobility: Supine to Sit, Sit to Supine   Sidelying to sit: Min assist   Sit to supine: Min guard   General bed mobility comments: Pt needing assist with scooting to EOB and repositioning LLE    Transfers Overall transfer level: Needs assistance Equipment used: Rolling walker (2 wheels) Transfers: Sit to/from Stand Sit to Stand: Mod assist           General transfer comment: Cues for hand placement with RW use      Balance Overall balance assessment: Needs assistance Sitting-balance support: No upper extremity supported, Feet supported Sitting balance-Leahy Scale: Fair     Standing balance support: Bilateral upper extremity supported, During functional activity Standing balance-Leahy Scale: Poor Standing balance comment: RW support                           ADL either performed or assessed with clinical judgement   ADL Overall ADL's : Needs assistance/impaired Eating/Feeding: Sitting;Set up;Supervision/ safety   Grooming: Sitting;Set up;Supervision/safety   Upper Body Bathing: Sitting;Set up;Supervision/ safety   Lower Body Bathing: Sitting/lateral leans;Moderate assistance   Upper Body Dressing : Sitting;Set up;Supervision/safety   Lower Body Dressing: Sitting/lateral leans;Moderate assistance   Toilet Transfer: Min guard;Rolling walker (2 wheels);BSC/3in1   Toileting- Clothing Manipulation and Hygiene: Sitting/lateral lean;Minimal assistance       Functional mobility during ADLs: Min guard;Rolling walker (2  wheels) General ADL Comments: Pt fatigues quickly, ambulated to Doorway and back.      Vision         Perception     Praxis      Pertinent Vitals/Pain Pain Assessment Pain Assessment: Faces Faces Pain Scale: Hurts a little bit Pain Location: abdomen Pain Descriptors / Indicators: Sore Pain Intervention(s): Monitored during session, Repositioned     Hand Dominance Right   Extremity/Trunk Assessment Upper Extremity Assessment Upper Extremity Assessment: Generalized weakness   Lower Extremity Assessment Lower Extremity Assessment: Generalized weakness   Cervical / Trunk Assessment Cervical / Trunk Assessment: Kyphotic   Communication Communication Communication: No difficulties   Cognition Arousal/Alertness: Awake/alert Behavior During Therapy: WFL for tasks assessed/performed Overall Cognitive Status: Within Functional Limits for tasks assessed                                       General Comments  BP taken before mobility 95/76, 90/50 while standing, and 98/77 with ambulation. HR up to 88 with ambulation, pt with mild c/o dizziness while standing    Exercises     Shoulder Instructions      Home Living Family/patient expects to be discharged to:: Assisted living Living Arrangements: Alone Available Help at Discharge: Available PRN/intermittently Type of Home: Independent living facility Home Access: Level entry     Home Layout: One level     Bathroom Shower/Tub: Producer, television/film/video: Standard     Home Equipment: Agricultural consultant (2 wheels);Grab bars - tub/shower;Grab bars - toilet;Shower seat - built in   Additional Comments: pt recently transitioned to [employer]      Prior Functioning/Environment Prior Level of Function : History of Falls (last six months);Needs assist             Mobility Comments: ambulatory with RW for household distances, walking to dining hall per spouse ADLs Comments: assistance for ADLs since hospitalization in May        OT Problem List: Decreased  strength;Impaired balance (sitting and/or standing);Decreased activity tolerance      OT Treatment/Interventions: Therapeutic exercise;Therapeutic activities;Self-care/ADL training;Energy conservation;Balance training;Patient/family education    OT Goals(Current goals can be found in the care plan section) Acute Rehab OT Goals Patient Stated Goal: to get back to ALF and walking OT Goal Formulation: With patient/family Time For Goal Achievement: 02/07/23 Potential to Achieve Goals: Good ADL Goals Pt Will Perform Grooming: standing;with min guard assist Pt Will Perform Lower Body Dressing: sitting/lateral leans;with supervision Pt Will Transfer to Toilet: ambulating;with supervision Additional ADL Goal #1: Pt will demonstrate use of energy conservation strategies prn with functional mobility with min cueing  OT Frequency: Min 2X/week    Co-evaluation              AM-PAC OT "6 Clicks" Daily Activity     Outcome Measure Help from another person eating meals?: None Help from another person taking care of personal grooming?: A Little Help from another person toileting, which includes using toliet, bedpan, or urinal?: A Little Help from another person bathing (including washing, rinsing, drying)?: A Lot Help from another person to put on and taking off regular upper body clothing?: None Help from another person to put on and taking off regular lower body clothing?: A Lot 6 Click Score: 18   End of Session Equipment Utilized During Treatment: Gait belt;Rolling walker (2 wheels) Nurse Communication: Mobility status  Activity Tolerance: Patient tolerated treatment well Patient left: in bed;with call bell/phone within reach;with bed alarm set;with family/visitor present  OT Visit Diagnosis: Muscle weakness (generalized) (M62.81);Unsteadiness on feet (R26.81);History of falling (Z91.81)                Time: 4098-1191 OT Time Calculation (min): 31 min Charges:  OT General Charges $OT  Visit: 1 Visit OT Evaluation $OT Eval Moderate Complexity: 1 Mod OT Treatments $Therapeutic Activity: 8-22 mins  01/25/2023  AB, OTR/L  Acute Rehabilitation Services  Office: [phone]   Tristan Schroeder 01/25/2023, 12:31 PM

## 2023-01-25 NOTE — Progress Notes (Signed)
EEG complete - results pending 

## 2023-01-25 NOTE — Procedures (Signed)
Patient Name: [name]  MRN: [id number]  Epilepsy Attending: Charlsie Quest  Referring Physician/Provider: Clydie Braun, MD  Date: 01/25/2023 Duration: 27.24 mins  Patient history: 72yo M with syncope getting eeg to evaluate for seizure.  Level of alertness: Awake, asleep  AEDs during EEG study: None  Technical aspects: This EEG study was done with scalp electrodes positioned according to the 10-20 International system of electrode placement. Electrical activity was reviewed with band pass filter of 1-70Hz , sensitivity of 7 uV/mm, display speed of 24mm/sec with a 60Hz  notched filter applied as appropriate. EEG data were recorded continuously and digitally stored.  Video monitoring was available and reviewed as appropriate.  Description: The posterior dominant rhythm consists of 7.5 Hz activity of moderate voltage (25-35 uV) seen predominantly in posterior head regions, symmetric and reactive to eye opening and eye closing. Sleep was characterized by vertex waves, sleep spindles (12 to 14 Hz), maximal frontocentral region. Physiologic photic driving was not seen during photic stimulation.  Hyperventilation was not performed.     IMPRESSION: This study is within normal limits. No seizures or epileptiform discharges were seen throughout the recording.  A normal interictal EEG does not exclude the diagnosis of epilepsy.  Jonathon Parker

## 2023-01-25 NOTE — Progress Notes (Signed)
RE: Jonathon Parker Date of Birth:  03/28/1951 Date: 01/25/23  Please be advised that the above-named patient will require a short-term nursing home stay - anticipated 30 days or less for rehabilitation and strengthening.  The plan is for return home.

## 2023-01-26 DIAGNOSIS — R55 Syncope and collapse: Secondary | ICD-10-CM | POA: Diagnosis not present

## 2023-01-26 LAB — CBC
HCT: 29.7 % — ABNORMAL LOW (ref 39.0–52.0)
Hemoglobin: 9.5 g/dL — ABNORMAL LOW (ref 13.0–17.0)
MCH: 31.3 pg (ref 26.0–34.0)
MCHC: 32 g/dL (ref 30.0–36.0)
MCV: 97.7 fL (ref 80.0–100.0)
Platelets: 123 10*3/uL — ABNORMAL LOW (ref 150–400)
RBC: 3.04 MIL/uL — ABNORMAL LOW (ref 4.22–5.81)
RDW: 15 % (ref 11.5–15.5)
WBC: 3 10*3/uL — ABNORMAL LOW (ref 4.0–10.5)
nRBC: 0 % (ref 0.0–0.2)

## 2023-01-26 LAB — BASIC METABOLIC PANEL
Anion gap: 8 (ref 5–15)
BUN: 11 mg/dL (ref 8–23)
CO2: 23 mmol/L (ref 22–32)
Calcium: 8.3 mg/dL — ABNORMAL LOW (ref 8.9–10.3)
Chloride: 110 mmol/L (ref 98–111)
Creatinine, Ser: 1.11 mg/dL (ref 0.61–1.24)
GFR, Estimated: >60 mL/min (ref >60)
Glucose, Bld: 89 mg/dL (ref 70–99)
Potassium: 3.7 mmol/L (ref 3.5–5.1)
Sodium: 141 mmol/L (ref 135–145)

## 2023-01-26 NOTE — TOC Progression Note (Signed)
Transition of Care [facility]) - Progression Note    Patient Details  Name: Jonathon Parker MRN: [id number] Date of Birth: 02-05-1951  Transition of [facility]) CM/SW Contact  Darolyn Rua, [place] Phone Number: 01/26/2023, 10:31 AM  Clinical Narrative:     All clinicals uploaded to [facility], pending PASRR number at this time.   Expected Discharge Plan: Skilled Nursing Facility Barriers to Discharge: Continued Medical Work up, English as a second language teacher, SNF Pending bed offer  Expected Discharge Plan and Services In-house Referral: Clinical Social Work   Post Acute Care Choice: Skilled Nursing Facility Living arrangements for the past 2 months: Assisted Living Facility                                       Social Determinants of Health (SDOH) Interventions SDOH Screenings   Food Insecurity: No Food Insecurity (01/25/2023)  Housing: Low Risk  (01/25/2023)  Transportation Needs: No Transportation Needs (01/25/2023)  Utilities: Not At Risk (01/25/2023)  Tobacco Use: Low Risk  (01/24/2023)    Readmission Risk Interventions     No data to display

## 2023-01-26 NOTE — Progress Notes (Signed)
PROGRESS NOTE    Jonathon Parker  PIR:[id number] DOB: 16-Apr-1951 DOA: 01/24/2023 PCP: Paulina Fusi, MD   Brief Narrative:  Jonathon Parker is a 72 y.o. male with medical history significant of  history of CLL, paroxysmal A-fib on Eliquis, tachybradycardia syndrome  s/p PPM, history of colon cancer s/p hemicolectomy more than 10 years ago, GERD who presents after having a fall yesterday while in the shower with suspected syncope/collapse after prodrome of lightheadedness/dizziness.  Hospitalist called for admission.  Assessment & Plan:   Principal Problem:   Syncope and collapse Active Problems:   Paroxysmal atrial fibrillation (HCC)   Status post placement of cardiac pacemaker   AKI (acute kidney injury) (HCC)   Macrocytic anemia   Essential hypertension   Bowel perforation (HCC)   Depression   Dementia without behavioral disturbance (HCC)   GERD (gastroesophageal reflux disease)  Suspected syncope and collapse Likely orthostatic hypotension in etiology, POA -Prodrome of lightheadedness prior to falling before admission duplicated daily while attempting to stand/ambulate - orthostatic vital signs continue to improve but remain positive. Patient able to walk 8 feet today with assistance for getting symptomatic. -CT head negative for acute process -Seizure unlikely, EEG within normal limits per neurology -Continue IV fluids, encouraged to increase p.o. intake, follow PT OT recommendations.  Given current ambulatory dysfunction and instability likely will discharge to SNF pending clinical improvement.   Paroxysmal atrial fibrillation on chronic anticoagulation Tachybradycardia syndrome s/p PPM -A-fib with transient RVR at intake likely secondary to orthostatic hypotension -Status post permanent pacemaker April 2024 -Continue Eliquis -Initially requiring Cardizem drip, weaned off - continue p.o. metoprolol -Pacemaker interrogation per protocol  Acute kidney  injury - resolving -Baseline creatinine 0.9  -Creatinine elevated 1.3 at intake, downtrending appropriately -Continues on IV fluid given above   Macrocytic anemia Chronic.  No acute indication for treatment, followed labs as appropriate.   Essential hypertension Well-controlled, continue metoprolol   History of bowel perforation, recent Patient hospitalized last month requiring explorative laparotomy with lysis of adhesions, small bowel resection with primary anastomosis for contained perforation of the jejunal diverticulum   Depression -Continue Wellbutrin   Dementia, without mood disturbances -Alert and oriented to person place and situation. -Continue Aricept   GERD -Continue Protonix and Carafate   OSA on CPAP -Continue CPAP at night  DVT prophylaxis: apixaban (ELIQUIS) tablet 5 mg  Code Status:   Code Status: Full Code Family Communication: None present  Status is: Inpt  Dispo: The patient is from: Home              Anticipated d/c is to: Home              Anticipated d/c date is: 24 to 48 hours pending clinical course              Patient currently not medically stable for discharge given ongoing orthostatic symptoms, high risk for falls requiring safe disposition and further PT and OT evaluation and treatment.  Consultants:  None  Procedures:  None  Antimicrobials:  None indicated  Subjective: No acute issues or events overnight denies nausea vomiting diarrhea constipation headache fevers chills or chest pain.  Patient does endorse lightheadedness, dizziness when standing earlier for orthostatic vital signs.  Objective: Vitals:   01/25/23 1426 01/25/23 2022 01/25/23 2247 01/26/23 0608  BP: 106/66 118/65 106/69 116/69  Pulse: 63 61 60 60  Resp: 20 (!) 22 (!) 23 17  Temp: 97.9 F (36.6 C) 98.5 F (36.9 C) 97.7 F (  36.5 C) 98.1 F (36.7 C)  TempSrc: Oral Oral Oral Oral  SpO2: 96% 100% 95% 95%  Weight:      Height:        Intake/Output Summary  (Last 24 hours) at 01/26/2023 0733 Last data filed at February 24, 2023 2139 Gross per 24 hour  Intake 1508.62 ml  Output 350 ml  Net 1158.62 ml    Filed Weights   01/24/23 1058  Weight: 63.5 kg    Examination:  General:  Pleasantly resting in bedside chair, No acute distress. HEENT:  Normocephalic atraumatic.  Sclerae nonicteric, noninjected.  Extraocular movements intact bilaterally. Neck:  Without mass or deformity.  Trachea is midline. Lungs:  Clear to auscultate bilaterally without rhonchi, wheeze, or rales. Heart:  Regular rate and rhythm.  Without murmurs, rubs, or gallops. Abdomen:  Soft, nontender, nondistended.  Without guarding or rebound. Extremities: Without cyanosis, clubbing, edema, or obvious deformity. Skin:  Warm and dry, no erythema   Data Reviewed: I have personally reviewed following labs and imaging studies  CBC: Recent Labs  Lab 01/24/23 1114 01/24/23 1126 02-24-2023 0252  WBC 4.8  --  3.9*  NEUTROABS 2.3  --   --   HGB 12.4* 12.2* 10.4*  HCT 39.4 36.0* 32.2*  MCV 102.6*  --  96.1  PLT 150  --  140*    Basic Metabolic Panel: Recent Labs  Lab 01/24/23 1114 01/24/23 1126 01/24/23 1352 02/24/2023 0252  NA 140 140  --  141  K 3.7 3.7  --  3.7  CL 109 108  --  106  CO2 21*  --   --  22  GLUCOSE 94 95  --  96  BUN 16 18  --  13  CREATININE 1.33* 1.20  --  1.18  CALCIUM 9.0  --   --  8.5*  MG  --   --  1.9  --     GFR: Estimated Creatinine Clearance: 50.8 mL/min (by C-G formula based on SCr of 1.18 mg/dL). Liver Function Tests: Recent Labs  Lab 01/24/23 1114 24-Feb-2023 0252  AST 29 18  ALT 22 19  ALKPHOS 88 75  BILITOT 1.2 0.8  PROT 5.5* 4.5*  ALBUMIN 3.4* 2.8*     No results found for this or any previous visit (from the past 240 hour(s)).   Radiology Studies: EEG adult  Result Date: February 24, 2023 Jonathon Quest, MD     24-Feb-2023  7:29 AM Patient Name: Jonathon Parker [facility] MRN: [id number] Epilepsy Attending: Charlsie Parker Referring  Physician/Provider: Clydie Braun, MD Date: 2023/02/24 Duration: 27.24 mins Patient history: 72yo M with syncope getting eeg to evaluate for seizure. Level of alertness: Awake, asleep AEDs during EEG study: None Technical aspects: This EEG study was done with scalp electrodes positioned according to the 10-20 International system of electrode placement. Electrical activity was reviewed with band pass filter of 1-70Hz , sensitivity of 7 uV/mm, display speed of 29mm/sec with a 60Hz  notched filter applied as appropriate. EEG data were recorded continuously and digitally stored.  Video monitoring was available and reviewed as appropriate. Description: The posterior dominant rhythm consists of 7.5 Hz activity of moderate voltage (25-35 uV) seen predominantly in posterior head regions, symmetric and reactive to eye opening and eye closing. Sleep was characterized by vertex waves, sleep spindles (12 to 14 Hz), maximal frontocentral region. Physiologic photic driving was not seen during photic stimulation.  Hyperventilation was not performed.   IMPRESSION: This study is within normal limits. No seizures or epileptiform  discharges were seen throughout the recording. A normal interictal EEG does not exclude the diagnosis of epilepsy. Jonathon Parker   CT Head Wo Contrast  Result Date: 01/24/2023 CLINICAL DATA:  Head trauma, minor (Age >= 65y); Neck trauma (Age >= 65y). EXAM: CT HEAD WITHOUT CONTRAST CT CERVICAL SPINE WITHOUT CONTRAST TECHNIQUE: Multidetector CT imaging of the head and cervical spine was performed following the standard protocol without intravenous contrast. Multiplanar CT image reconstructions of the cervical spine were also generated. RADIATION DOSE REDUCTION: This exam was performed according to the departmental dose-optimization program which includes automated exposure control, adjustment of the mA and/or kV according to patient size and/or use of iterative reconstruction technique. COMPARISON:   Head CT 12/25/2022. FINDINGS: CT HEAD FINDINGS Brain: No acute hemorrhage. 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 Cortical gray-white differentiation is otherwise preserved. No hydrocephalus or extra-axial collection. No mass effect or midline shift. Vascular: No hyperdense vessel or unexpected calcification. Skull: No calvarial fracture or suspicious bone lesion. Skull base is unremarkable. Sinuses/Orbits: Mild mucosal disease in the maxillary sinuses. Orbits and mastoids are unremarkable. Other: None. CT CERVICAL SPINE FINDINGS Alignment: Normal. Skull base and vertebrae: No acute fracture. Normal craniocervical junction. No suspicious bone lesions. Soft tissues and spinal canal: No prevertebral fluid or swelling. No visible canal hematoma. Disc levels: Multilevel cervical spondylosis with at least mild spinal canal stenosis from C3-4 to C6-7. Upper chest: Unremarkable. Other: Atherosclerotic calcifications of the carotid bulbs. IMPRESSION: 1. No acute intracranial abnormality. Unchanged mild chronic small-vessel disease and age advanced volume loss. 2. No acute fracture or traumatic listhesis in the cervical spine. Multilevel cervical spondylosis with at least mild spinal canal stenosis from C3-4 to C6-7. Electronically Signed   By: Orvan Falconer M.D.   On: 01/24/2023 12:52   CT Cervical Spine Wo Contrast  Result Date: 01/24/2023 CLINICAL DATA:  Head trauma, minor (Age >= 65y); Neck trauma (Age >= 65y). EXAM: CT HEAD WITHOUT CONTRAST CT CERVICAL SPINE WITHOUT CONTRAST TECHNIQUE: Multidetector CT imaging of the head and cervical spine was performed following the standard protocol without intravenous contrast. Multiplanar CT image reconstructions of the cervical spine were also generated. RADIATION DOSE REDUCTION: This exam was performed according to the departmental dose-optimization program which includes automated  exposure control, adjustment of the mA and/or kV according to patient size and/or use of iterative reconstruction technique. COMPARISON:  Head CT 12/25/2022. FINDINGS: CT HEAD FINDINGS Brain: No acute hemorrhage. 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 Cortical gray-white differentiation is otherwise preserved. No hydrocephalus or extra-axial collection. No mass effect or midline shift. Vascular: No hyperdense vessel or unexpected calcification. Skull: No calvarial fracture or suspicious bone lesion. Skull base is unremarkable. Sinuses/Orbits: Mild mucosal disease in the maxillary sinuses. Orbits and mastoids are unremarkable. Other: None. CT CERVICAL SPINE FINDINGS Alignment: Normal. Skull base and vertebrae: No acute fracture. Normal craniocervical junction. No suspicious bone lesions. Soft tissues and spinal canal: No prevertebral fluid or swelling. No visible canal hematoma. Disc levels: Multilevel cervical spondylosis with at least mild spinal canal stenosis from C3-4 to C6-7. Upper chest: Unremarkable. Other: Atherosclerotic calcifications of the carotid bulbs. IMPRESSION: 1. No acute intracranial abnormality. Unchanged mild chronic small-vessel disease and age advanced volume loss. 2. No acute fracture or traumatic listhesis in the cervical spine. Multilevel cervical spondylosis with at least mild spinal canal stenosis from C3-4 to  C6-7. Electronically Signed   By: Orvan Falconer M.D.   On: 01/24/2023 12:52   CT ABDOMEN PELVIS W CONTRAST  Result Date: 01/24/2023 CLINICAL DATA:  Abdominal trauma, blunt. EXAM: CT ABDOMEN AND PELVIS WITH CONTRAST TECHNIQUE: Multidetector CT imaging of the abdomen and pelvis was performed using the standard protocol following bolus administration of intravenous contrast. RADIATION DOSE REDUCTION: This exam was performed according to the departmental dose-optimization program which  includes automated exposure control, adjustment of the mA and/or kV according to patient size and/or use of iterative reconstruction technique. CONTRAST:  75mL OMNIPAQUE IOHEXOL 350 MG/ML SOLN COMPARISON:  12/15/2022 FINDINGS: Lower chest: Residual subsegmental atelectasis noted in the lung bases. The small pleural effusions have resolved in the interval. Hepatobiliary: No focal liver abnormality is seen. Status post cholecystectomy. No biliary dilatation. Pancreas: Unremarkable. No pancreatic ductal dilatation or surrounding inflammatory changes. Spleen: Normal in size without focal abnormality. Adrenals/Urinary Tract: Adrenal glands are unremarkable. Kidneys are normal, without renal calculi, focal lesion, or hydronephrosis. Bladder is unremarkable. Stomach/Bowel: Stomach appears normal. No pathologic dilatation of the large or small bowel loops. No bowel wall thickening or inflammation. Signs of recent small bowel resection with enteroenteric anastomosis within the right upper quadrant of the abdomen. No signs of anastomotic dehiscence. Mild surrounding soft tissue stranding is favored to represent residual postoperative inflammation. Postsurgical changes from remote APR and appendectomy. Vascular/Lymphatic: Aortic atherosclerosis. No signs of abdominopelvic adenopathy. Reproductive: Prostate is unremarkable. Other: Previous bilateral inguinal herniorrhaphy. There is no signs of free fluid or fluid collections. No pneumoperitoneum. Postsurgical changes identified along the midline of the ventral abdominal wall. Musculoskeletal: Degenerative disc disease is noted at L5-S1. Mild first degree anterolisthesis of L5 on S1 noted. No acute or suspicious osseous findings. IMPRESSION: 1. No acute findings identified within the abdomen or pelvis. 2. Signs of recent small bowel resection with enteroenteric anastomosis within the right upper quadrant of the abdomen. No signs of anastomotic dehiscence. Mild surrounding soft  tissue stranding is favored to represent residual postoperative inflammation. 3. Interval resolution of small pleural effusions. 4.  Aortic Atherosclerosis (ICD10-I70.0). Electronically Signed   By: Signa Kell M.D.   On: 01/24/2023 12:52   DG Chest Port 1 View  Result Date: 01/24/2023 CLINICAL DATA:  Syncope. EXAM: PORTABLE CHEST 1 VIEW COMPARISON:  12/21/2022 FINDINGS: The lungs are clear without focal pneumonia, edema, pneumothorax or pleural effusion. Streaky opacity in the left base suggest atelectasis or scarring. The cardiopericardial silhouette is within normal limits for size. Left-sided permanent pacemaker noted. Telemetry leads overlie the chest. IMPRESSION: Streaky left basilar opacity suggests atelectasis or scarring. Otherwise no acute cardiopulmonary findings. Electronically Signed   By: Kennith Parker M.D.   On: 01/24/2023 12:25    Scheduled Meds:  apixaban  5 mg Oral BID   atorvastatin  20 mg Oral QHS   buPROPion  300 mg Oral Daily   colestipol  1 g Oral QPM   donepezil  5 mg Oral QHS   metoprolol tartrate  75 mg Oral BID   pantoprazole  40 mg Oral Daily   potassium chloride  20 mEq Oral Daily   sodium chloride flush  3 mL Intravenous Q12H   traZODone  100 mg Oral QHS   Continuous Infusions:  sodium chloride 75 mL/hr at 01/25/23 2139     LOS: 1 day   Time spent:  Azucena Fallen, DO [facility]  If 7PM-7AM, please contact night-coverage [email or web address]  01/26/2023, 7:33 AM

## 2023-01-26 NOTE — Progress Notes (Signed)
Mobility Specialist Progress Note    01/26/23 1251  Mobility  Activity Ambulated with assistance in room  Level of Assistance Minimal assist, patient does 75% or more  Assistive Device Front wheel walker  Distance Ambulated (ft) 8 ft  Activity Response Tolerated well  Mobility Referral Yes  $Mobility charge 1 Mobility  Mobility Specialist Start Time (ACUTE ONLY) 1245  Mobility Specialist Stop Time (ACUTE ONLY) 1250  Mobility Specialist Time Calculation (min) (ACUTE ONLY) 5 min   Post-Mobility: 82 HR  Pt received in chair requesting to get back to bed. No complaints. Left with call bell in reach and wife present.   [place] Mobility Specialist  Please Neurosurgeon or Rehab Office at [phone]

## 2023-01-27 DIAGNOSIS — R55 Syncope and collapse: Secondary | ICD-10-CM | POA: Diagnosis not present

## 2023-01-27 MED ORDER — METOPROLOL TARTRATE 12.5 MG HALF TABLET
12.5000 mg | ORAL_TABLET | Freq: Two times a day (BID) | ORAL | Status: DC
  Administered 2023-01-27 – 2023-01-30 (×6): 12.5 mg via ORAL
  Filled 2023-01-27 (×6): qty 1

## 2023-01-27 NOTE — TOC Progression Note (Signed)
Transition of Care [facility]) - Progression Note    Patient Details  Name: Jonathon Parker MRN: [id number] Date of Birth: 05/16/1951  Transition of Care [facility]) CM/SW Contact  Eduard Roux, LCSW Phone Number: 01/27/2023, 10:31 AM  Clinical Narrative:     [name] # [phone] [id number]    Expected Discharge Plan: Skilled Nursing Facility Barriers to Discharge: Continued Medical Work up, English as a second language teacher, SNF Pending bed offer  Expected Discharge Plan and Services In-house Referral: Clinical Social Work   Post Acute Care Choice: Skilled Nursing Facility Living arrangements for the past 2 months: Assisted Living Facility                                       Social Determinants of Health (SDOH) Interventions SDOH Screenings   Food Insecurity: No Food Insecurity (01/25/2023)  Housing: Low Risk  (01/25/2023)  Transportation Needs: No Transportation Needs (01/25/2023)  Utilities: Not At Risk (01/25/2023)  Tobacco Use: Low Risk  (01/24/2023)    Readmission Risk Interventions     No data to display

## 2023-01-27 NOTE — Progress Notes (Signed)
SATURATION QUALIFICATIONS: (This note is used to comply with regulatory documentation for home oxygen)  Patient Saturations on Room Air at Rest = 99%  Patient Saturations on Room Air while Ambulating = 97%  Patient Saturations on N/A  Liters of oxygen while Ambulating = N/A

## 2023-01-27 NOTE — Progress Notes (Signed)
PROGRESS NOTE    Jonathon Parker [facility]  HYQ:[id number] DOB: 02/12/1951 DOA: 01/24/2023 PCP: Paulina Fusi, MD   Brief Narrative:  Jonathon Parker is a 72 y.o. male who presents from assisted living facility with medical history significant of  history of CLL, paroxysmal A-fib on Eliquis, tachybradycardia syndrome  s/p PPM, history of colon cancer s/p hemicolectomy more than 10 years ago, GERD who presents after having a fall yesterday while in the shower with suspected syncope/collapse after prodrome of lightheadedness/dizziness.  Hospitalist called for admission.  Patient symptoms continue to improve, remains somewhat symptomatic with orthostatics, no longer having syncopal episodes but certainly still having episodes of borderline hypotension.  As such patient is being considered for skilled nursing facility for ongoing physical therapy while blood pressure continues to improve.  At this time patient is medically stable for discharge awaiting safe disposition.  Assessment & Plan:   Principal Problem:   Syncope and collapse Active Problems:   Paroxysmal atrial fibrillation (HCC)   Status post placement of cardiac pacemaker   AKI (acute kidney injury) (HCC)   Macrocytic anemia   Essential hypertension   Bowel perforation (HCC)   Depression   Dementia without behavioral disturbance (HCC)   GERD (gastroesophageal reflux disease)  Suspected syncope and collapse Likely orthostatic hypotension in etiology, POA -Prodrome of lightheadedness prior to falling before admission duplicated daily while attempting to stand/ambulate - orthostatic vital signs continue to improve but remain positive. Patient minimally ambulatory, able to walk a maximum of 8 feet in the past 24 hours still requiring assistance given symptoms. -CT head negative for acute process -Seizure unlikely, EEG within normal limits per neurology -Continue IV fluids, encouraged to increase p.o. intake, follow PT OT  recommendations.  Given current ambulatory dysfunction and instability likely will discharge to SNF pending clinical improvement. -Decrease metoprolol from 75 twice daily to 12.5, follow for tachycardia given below   Paroxysmal atrial fibrillation on chronic anticoagulation Tachybradycardia syndrome s/p PPM -A-fib with transient RVR at intake likely secondary to orthostatic hypotension -Status post permanent pacemaker April 2024 -Continue Eliquis -Initially requiring Cardizem drip, weaned off - continue p.o. metoprolol(wean to 12.5mg  twice daily) -Pacemaker interrogation per protocol  Acute kidney injury - resolving -Baseline creatinine 0.9  -Creatinine elevated 1.3 at intake, downtrending appropriately -Continues on IV fluid given above   Macrocytic anemia Chronic.  No acute indication for treatment, followed labs as appropriate.   Essential hypertension Well-controlled, continue metoprolol   History of bowel perforation, recent Patient hospitalized last month requiring explorative laparotomy with lysis of adhesions, small bowel resection with primary anastomosis for contained perforation of the jejunal diverticulum   Depression -Continue Wellbutrin   Dementia, without mood disturbances -Alert and oriented to person place and situation. -Continue Aricept   GERD -Continue Protonix and Carafate   OSA on CPAP -Continue CPAP at night  DVT prophylaxis: apixaban (ELIQUIS) tablet 5 mg  Code Status:   Code Status: Full Code Family Communication: None present  Status is: Inpt  Dispo: The patient is from: Home              Anticipated d/c is to: Home              Anticipated d/c date is: Pending placement              Patient currently medically stable for discharge awaiting safe disposition at skilled nursing facility..  Consultants:  None  Procedures:  None  Antimicrobials:  None indicated  Subjective: No acute issues  or events overnight denies nausea vomiting  diarrhea constipation headache fevers chills or chest pain.  Patient continues to endorse symptoms of dizziness with standing but notes improvement in symptoms, less profound.  Denies any syncope or near syncope even with exertion.  Objective: Vitals:   01/26/23 0859 01/26/23 0923 01/26/23 2007 01/27/23 0515  BP: 132/69 119/65 130/71 139/76  Pulse: 61 63 60 66  Resp: 16  18 15   Temp: 98.4 F (36.9 C)  98.3 F (36.8 C) 97.6 F (36.4 C)  TempSrc: Oral  Oral Oral  SpO2: 95%  97% 98%  Weight:      Height:        Intake/Output Summary (Last 24 hours) at 01/27/2023 0739 Last data filed at 01/27/2023 0700 Gross per 24 hour  Intake 2039.53 ml  Output 350 ml  Net 1689.53 ml    Filed Weights   01/24/23 1058  Weight: 63.5 kg    Examination:  General:  Pleasantly resting in bedside chair, No acute distress. HEENT:  Normocephalic atraumatic.  Sclerae nonicteric, noninjected.  Extraocular movements intact bilaterally. Neck:  Without mass or deformity.  Trachea is midline. Lungs:  Clear to auscultate bilaterally without rhonchi, wheeze, or rales. Heart:  Regular rate and rhythm.  Without murmurs, rubs, or gallops. Abdomen:  Soft, nontender, nondistended.  Without guarding or rebound. Extremities: Without cyanosis, clubbing, edema, or obvious deformity. Skin:  Warm and dry, no erythema   Data Reviewed: I have personally reviewed following labs and imaging studies  CBC: Recent Labs  Lab 01/24/23 1114 01/24/23 1126 01/25/23 0252 01/26/23 0704  WBC 4.8  --  3.9* 3.0*  NEUTROABS 2.3  --   --   --   HGB 12.4* 12.2* 10.4* 9.5*  HCT 39.4 36.0* 32.2* 29.7*  MCV 102.6*  --  96.1 97.7  PLT 150  --  140* 123*    Basic Metabolic Panel: Recent Labs  Lab 01/24/23 1114 01/24/23 1126 01/24/23 1352 01/25/23 0252 01/26/23 0704  NA 140 140  --  141 141  K 3.7 3.7  --  3.7 3.7  CL 109 108  --  106 110  CO2 21*  --   --  22 23  GLUCOSE 94 95  --  96 89  BUN 16 18  --  13 11   CREATININE 1.33* 1.20  --  1.18 1.11  CALCIUM 9.0  --   --  8.5* 8.3*  MG  --   --  1.9  --   --     GFR: Estimated Creatinine Clearance: 54 mL/min (by C-G formula based on SCr of 1.11 mg/dL). Liver Function Tests: Recent Labs  Lab 01/24/23 1114 01/25/23 0252  AST 29 18  ALT 22 19  ALKPHOS 88 75  BILITOT 1.2 0.8  PROT 5.5* 4.5*  ALBUMIN 3.4* 2.8*     No results found for this or any previous visit (from the past 240 hour(s)).   Radiology Studies: No results found.  Scheduled Meds:  apixaban  5 mg Oral BID   atorvastatin  20 mg Oral QHS   buPROPion  300 mg Oral Daily   colestipol  1 g Oral QPM   donepezil  5 mg Oral QHS   metoprolol tartrate  75 mg Oral BID   pantoprazole  40 mg Oral Daily   potassium chloride  20 mEq Oral Daily   sodium chloride flush  3 mL Intravenous Q12H   traZODone  100 mg Oral QHS   Continuous Infusions:  sodium chloride 75 mL/hr at 01/26/23 2040     LOS: 2 days   Time spent:  Azucena Fallen, DO [facility]  If 7PM-7AM, please contact night-coverage [email or web address]  01/27/2023, 7:39 AM

## 2023-01-27 NOTE — TOC Progression Note (Signed)
Transition of Care [facility]) - Progression Note    Patient Details  Name: Jonathon Parker MRN: [id number] Date of Birth: 1950-12-01  Transition of Care [facility]) CM/SW Contact  Eduard Roux, [place] Phone Number: 01/27/2023, 1:35 PM  Clinical Narrative:     CSW spoke with patient's wife. She confirmed patient is form [facility] in [place]. CSW discussed the recommendations for short term rehab at [facility]. She states she agrees with PT/OT recommendations. Her preferred SNF is [facility] in [place] .  CSW contacted [facility] - they confirmed bed offer- advised after 3 days patient will be in co-pay status. Patient's wife is aware and is agreeable to the patient discharging to SNF once medically stable.   Spoke with  DON Leanne at [facility]- advised the patient will d/c to SNF- sent updated progress & Labs  notes as requested   Patient will need insurance approval once stable.  Antony Blackbird, MSW, LCSW Clinical Social Worker       Expected Discharge Plan: Skilled Nursing Facility Barriers to Discharge: Continued Medical Work up, English as a second language teacher, SNF Pending bed offer  Expected Discharge Plan and Services In-house Referral: Clinical Social Work   Post Acute Care Choice: Skilled Nursing Facility Living arrangements for the past 2 months: Assisted Living Facility                                       Social Determinants of Health (SDOH) Interventions SDOH Screenings   Food Insecurity: No Food Insecurity (01/25/2023)  Housing: Low Risk  (01/25/2023)  Transportation Needs: No Transportation Needs (01/25/2023)  Utilities: Not At Risk (01/25/2023)  Tobacco Use: Low Risk  (01/24/2023)    Readmission Risk Interventions     No data to display

## 2023-01-28 DIAGNOSIS — R55 Syncope and collapse: Secondary | ICD-10-CM | POA: Diagnosis not present

## 2023-01-28 LAB — BASIC METABOLIC PANEL
Anion gap: 9 (ref 5–15)
BUN: 8 mg/dL (ref 8–23)
CO2: 23 mmol/L (ref 22–32)
Calcium: 8.5 mg/dL — ABNORMAL LOW (ref 8.9–10.3)
Chloride: 106 mmol/L (ref 98–111)
Creatinine, Ser: 0.97 mg/dL (ref 0.61–1.24)
GFR, Estimated: >60 mL/min (ref >60)
Glucose, Bld: 93 mg/dL (ref 70–99)
Potassium: 3.5 mmol/L (ref 3.5–5.1)
Sodium: 138 mmol/L (ref 135–145)

## 2023-01-28 LAB — CBC
HCT: 31.6 % — ABNORMAL LOW (ref 39.0–52.0)
Hemoglobin: 10.7 g/dL — ABNORMAL LOW (ref 13.0–17.0)
MCH: 32.3 pg (ref 26.0–34.0)
MCHC: 33.9 g/dL (ref 30.0–36.0)
MCV: 95.5 fL (ref 80.0–100.0)
Platelets: 119 10*3/uL — ABNORMAL LOW (ref 150–400)
RBC: 3.31 MIL/uL — ABNORMAL LOW (ref 4.22–5.81)
RDW: 14.4 % (ref 11.5–15.5)
WBC: 2.5 10*3/uL — ABNORMAL LOW (ref 4.0–10.5)
nRBC: 0 % (ref 0.0–0.2)

## 2023-01-28 MED ORDER — POLYETHYLENE GLYCOL 3350 17 G PO PACK
17.0000 g | PACK | Freq: Every day | ORAL | Status: DC
  Administered 2023-01-28: 17 g via ORAL
  Filled 2023-01-28 (×2): qty 1

## 2023-01-28 NOTE — Plan of Care (Signed)
  Problem: Clinical Measurements: Goal: Will remain free from infection Outcome: Progressing   Problem: Clinical Measurements: Goal: Ability to maintain clinical measurements within normal limits will improve Outcome: Progressing   Problem: Health Behavior/Discharge Planning: Goal: Ability to manage health-related needs will improve Outcome: Progressing   Problem: Education: Goal: Knowledge of General Education information will improve Description: Including pain rating scale, medication(s)/side effects and non-pharmacologic comfort measures Outcome: Progressing   Problem: Activity: Goal: Risk for activity intolerance will decrease Outcome: Progressing   Problem: Activity: Goal: Risk for activity intolerance will decrease Outcome: Progressing   Problem: Clinical Measurements: Goal: Cardiovascular complication will be avoided Outcome: Progressing   Problem: Clinical Measurements: Goal: Diagnostic test results will improve Outcome: Progressing   Problem: Safety: Goal: Ability to remain free from injury will improve Outcome: Progressing   Problem: Skin Integrity: Goal: Risk for impaired skin integrity will decrease Outcome: Progressing

## 2023-01-28 NOTE — TOC Progression Note (Signed)
Transition of Care [facility]) - Progression Note    Patient Details  Name: Jonathon Parker MRN: [id number] Date of Birth: 08-Feb-1951  Transition of Care [facility]) CM/SW Contact  Baldemar Lenis, [place] Phone Number: 01/28/2023, 3:50 PM  Clinical Narrative:   CSW noting that patient is stable for discharge to SNF per MD note, verified stability with MD. CSW contacted [facility] [place], confirmed bed is still available for patient. CSW to initiate insurance authorization once Epic issues resolve. CSW to follow.    Expected Discharge Plan: Skilled Nursing Facility Barriers to Discharge: Continued Medical Work up, English as a second language teacher, SNF Pending bed offer  Expected Discharge Plan and Services In-house Referral: Clinical Social Work   Post Acute Care Choice: Skilled Nursing Facility Living arrangements for the past 2 months: Assisted Living Facility                                       Social Determinants of Health (SDOH) Interventions SDOH Screenings   Food Insecurity: No Food Insecurity (01/25/2023)  Housing: Low Risk  (01/25/2023)  Transportation Needs: No Transportation Needs (01/25/2023)  Utilities: Not At Risk (01/25/2023)  Tobacco Use: Low Risk  (01/24/2023)    Readmission Risk Interventions     No data to display

## 2023-01-28 NOTE — Progress Notes (Signed)
Physical Therapy Treatment Patient Details Name: Jonathon Parker [facility] MRN: [id number] DOB: Jan 26, 1951 Today's Date: 01/28/2023   History of Present Illness 72 y.o. male presents to [facility] hospital on 01/24/2023 after possible syncopal episode and fall. Pt found to be in afib with RVR in ED. PMH - HTN, R thalamic CVA, colon CA, CLL, GERD, Depression, enteritis, sepsis, mild cognitive impairment, pacer.    PT Comments    Pt making steady progress and able to amb some in hallway with assistance. Continues to have very poor posture and core strength which family member reports started with hospitalization in May. Patient will benefit from continued inpatient follow up therapy, <3 hours/day    Recommendations for follow up therapy are one component of a multi-disciplinary discharge planning process, led by the attending physician.  Recommendations may be updated based on patient status, additional functional criteria and insurance authorization.  Follow Up Recommendations  Can patient physically be transported by private vehicle: No    Assistance Recommended at Discharge Frequent or constant Supervision/Assistance  Patient can return home with the following A lot of help with walking and/or transfers;A lot of help with bathing/dressing/bathroom;Assistance with cooking/housework;Assist for transportation;Help with stairs or ramp for entrance;Direct supervision/assist for medications management;Direct supervision/assist for financial management   Equipment Recommendations  Other (comment) (To be determined at next venue)    Recommendations for Other Services       Precautions / Restrictions Precautions Precautions: Fall Precaution Comments: afib rvr Restrictions Weight Bearing Restrictions: No     Mobility  Bed Mobility Overal bed mobility: Needs Assistance Bed Mobility: Rolling, Sidelying to Sit, Sit to Sidelying Rolling: Min assist Sidelying to sit: Mod assist       General bed  mobility comments: Assist to bring legs off of bed, elevate trunk into sitting, and bring hips to EOB    Transfers Overall transfer level: Needs assistance Equipment used: Rolling walker (2 wheels) Transfers: Sit to/from Stand, Bed to chair/wheelchair/BSC Sit to Stand: Mod assist   Step pivot transfers: Min assist       General transfer comment: Assist to power up. Performed better from chair than from EOB. Bed to chair with small pivotal steps with walker with assist for balance and support.    Ambulation/Gait Ambulation/Gait assistance: Min assist, +2 safety/equipment Gait Distance (Feet): 65 Feet Assistive device: Rolling walker (2 wheels) Gait Pattern/deviations: Step-through pattern, Decreased step length - right, Decreased step length - left, Shuffle, Trunk flexed Gait velocity: decr Gait velocity interpretation: <1.31 ft/sec, indicative of household ambulator   General Gait Details: Assist for balance and support. Verbal/tactile cues to look up, stand more erect and stay closer to walker   Stairs             Wheelchair Mobility    Modified Rankin (Stroke Patients Only)       Balance Overall balance assessment: Needs assistance Sitting-balance support: Feet supported, Bilateral upper extremity supported Sitting balance-Leahy Scale: Poor Sitting balance - Comments: Intermittent min assist for rt lateral lean Postural control: Right lateral lean Standing balance support: Bilateral upper extremity supported, During functional activity Standing balance-Leahy Scale: Poor Standing balance comment: walker and min assist for static standing                            Cognition Arousal/Alertness: Awake/alert Behavior During Therapy: Flat affect Overall Cognitive Status: Impaired/Different from baseline Area of Impairment: Attention, Memory, Following commands, Safety/judgement, Awareness, Problem solving  Current Attention  Level: Selective Memory: Decreased short-term memory Following Commands: Follows one step commands consistently, Follows multi-step commands with increased time Safety/Judgement: Decreased awareness of safety, Decreased awareness of deficits Awareness: Emergent Problem Solving: Slow processing, Requires verbal cues          Exercises Other Exercises Other Exercises: neck extension x 5 in sitting. Other Exercises: isometric neck extension, neck lateral flexion, and neck rotatation to manual resistance Other Exercises: Sitting trunk extension x 5    General Comments General comments (skin integrity, edema, etc.): BP with slight decline but not orthostatic      Pertinent Vitals/Pain Pain Assessment Pain Assessment: No/denies pain    Home Living                          Prior Function            PT Goals (current goals can now be found in the care plan section) Progress towards PT goals: Progressing toward goals    Frequency    Min 1X/week      PT Plan Current plan remains appropriate    Co-evaluation              AM-PAC PT "6 Clicks" Mobility   Outcome Measure  Help needed turning from your back to your side while in a flat bed without using bedrails?: A Little Help needed moving from lying on your back to sitting on the side of a flat bed without using bedrails?: A Lot Help needed moving to and from a bed to a chair (including a wheelchair)?: A Little Help needed standing up from a chair using your arms (e.g., wheelchair or bedside chair)?: A Lot Help needed to walk in hospital room?: A Little Help needed climbing 3-5 steps with a railing? : Total 6 Click Score: 14    End of Session Equipment Utilized During Treatment: Gait belt Activity Tolerance: Patient tolerated treatment well Patient left: with call bell/phone within reach;with family/visitor present;in chair;with chair alarm set Nurse Communication: Mobility status PT Visit Diagnosis:  Other abnormalities of gait and mobility (R26.89);Muscle weakness (generalized) (M62.81)     Time: 4782-9562 PT Time Calculation (min) (ACUTE ONLY): 30 min  Charges:  $Gait Training: 8-22 mins $Therapeutic Exercise: 8-22 mins                     [facility] PT Acute Rehabilitation Services Office [phone]    Angelina Ok [facility] 01/28/2023, 2:44 PM

## 2023-01-28 NOTE — Progress Notes (Signed)
PROGRESS NOTE    Jonathon Parker [facility]  WNU:[id number] DOB: July 18, 1951 DOA: 01/24/2023 PCP: Paulina Fusi, MD   Brief Narrative:  Jonathon Parker is a 72 y.o. male who presents from assisted living facility with medical history significant of  history of CLL, paroxysmal A-fib on Eliquis, tachybradycardia syndrome  s/p PPM, history of colon cancer s/p hemicolectomy more than 10 years ago, GERD who presents after having a fall yesterday while in the shower with suspected syncope/collapse after prodrome of lightheadedness/dizziness.  Hospitalist called for admission.  Patient symptoms continue to improve, remains minimally symptomatic with position changes with positive orthostatics but no longer having syncopal episodes.  Patient remains medically stable for discharge awaiting safe disposition, discussed with wife at bedside today, she agrees with current plan, all questions were answered at bedside.  Assessment & Plan:   Principal Problem:   Syncope and collapse Active Problems:   Paroxysmal atrial fibrillation (HCC)   Status post placement of cardiac pacemaker   AKI (acute kidney injury) (HCC)   Macrocytic anemia   Essential hypertension   Bowel perforation (HCC)   Depression   Dementia without behavioral disturbance (HCC)   GERD (gastroesophageal reflux disease)  Suspected syncope and collapse Likely orthostatic hypotension in etiology, POA -Prodrome of lightheadedness prior to falling before admission duplicated daily while attempting to stand/ambulate - orthostatic vital signs continue to improve but remain positive. Patient continues to be 2+ assist with ongoing gait and balance issues with ongoing safety concerns for recurrent episode of fall/syncope. -CT head negative for acute process -Seizure unlikely, EEG within normal limits per neurology -IV fluids discontinued, encouraged to increase p.o. intake, follow PT OT recommendations -Decrease metoprolol from 75 twice  daily to 12.5, follow for tachycardia given below   Paroxysmal atrial fibrillation on chronic anticoagulation Tachybradycardia syndrome s/p PPM -A-fib with transient RVR at intake likely secondary to orthostatic hypotension -Status post permanent pacemaker April 2024 -Continue Eliquis -Initially requiring Cardizem drip, weaned off - continue p.o. metoprolol(wean to 12.5mg  twice daily) -Pacemaker interrogation per protocol  Acute kidney injury - resolving -Baseline creatinine 0.9  -Creatinine elevated 1.3 at intake, downtrending appropriately -Continues on IV fluid given above   Macrocytic anemia Chronic.  No acute indication for treatment, followed labs as appropriate.   Essential hypertension Well-controlled, continue metoprolol   History of bowel perforation, recent Patient hospitalized last month requiring explorative laparotomy with lysis of adhesions, small bowel resection with primary anastomosis for contained perforation of the jejunal diverticulum   Depression -Continue Wellbutrin   Dementia, without mood disturbances -Alert and oriented to person place and situation. -Continue Aricept   GERD -Continue Protonix and Carafate   OSA on CPAP -Continue CPAP at night  DVT prophylaxis: apixaban (ELIQUIS) tablet 5 mg  Code Status:   Code Status: Full Code Family Communication: None present  Status is: Inpt  Dispo: The patient is from: Home              Anticipated d/c is to: Home              Anticipated d/c date is: Pending placement              Patient currently medically stable for discharge awaiting safe disposition at skilled nursing facility..  Consultants:  None  Procedures:  None  Antimicrobials:  None indicated  Subjective: No acute issues or events overnight denies nausea vomiting diarrhea constipation headache fevers chills or chest pain.  Patient continues to endorse symptoms of dizziness with standing but  notes improvement in symptoms, less  profound.  Denies any syncope or near syncope even with exertion.  Objective: Vitals:   01/27/23 0922 01/27/23 1330 01/27/23 2047 01/28/23 0531  BP: 132/72 121/68 134/77 139/77  Pulse: 62 60 62 62  Resp: 18  16 16   Temp: 98.4 F (36.9 C) 98 F (36.7 C) 98.4 F (36.9 C) 97.8 F (36.6 C)  TempSrc: Oral Axillary Oral Oral  SpO2: 97%  98% 97%  Weight:      Height:        Intake/Output Summary (Last 24 hours) at 01/28/2023 0759 Last data filed at 01/28/2023 0533 Gross per 24 hour  Intake 182.44 ml  Output 1450 ml  Net -1267.56 ml    Filed Weights   01/24/23 1058  Weight: 63.5 kg    Examination:  General:  Pleasantly resting in bedside chair, No acute distress. HEENT:  Normocephalic atraumatic.  Sclerae nonicteric, noninjected.  Extraocular movements intact bilaterally. Neck:  Without mass or deformity.  Trachea is midline. Lungs:  Clear to auscultate bilaterally without rhonchi, wheeze, or rales. Heart:  Regular rate and rhythm.  Without murmurs, rubs, or gallops. Abdomen:  Soft, nontender, nondistended.  Without guarding or rebound. Extremities: Without cyanosis, clubbing, edema, or obvious deformity. Skin:  Warm and dry, no erythema   Data Reviewed: I have personally reviewed following labs and imaging studies  CBC: Recent Labs  Lab 01/24/23 1114 01/24/23 1126 01/25/23 0252 01/26/23 0704  WBC 4.8  --  3.9* 3.0*  NEUTROABS 2.3  --   --   --   HGB 12.4* 12.2* 10.4* 9.5*  HCT 39.4 36.0* 32.2* 29.7*  MCV 102.6*  --  96.1 97.7  PLT 150  --  140* 123*    Basic Metabolic Panel: Recent Labs  Lab 01/24/23 1114 01/24/23 1126 01/24/23 1352 01/25/23 0252 01/26/23 0704  NA 140 140  --  141 141  K 3.7 3.7  --  3.7 3.7  CL 109 108  --  106 110  CO2 21*  --   --  22 23  GLUCOSE 94 95  --  96 89  BUN 16 18  --  13 11  CREATININE 1.33* 1.20  --  1.18 1.11  CALCIUM 9.0  --   --  8.5* 8.3*  MG  --   --  1.9  --   --     GFR: Estimated Creatinine Clearance: 54  mL/min (by C-G formula based on SCr of 1.11 mg/dL). Liver Function Tests: Recent Labs  Lab 01/24/23 1114 01/25/23 0252  AST 29 18  ALT 22 19  ALKPHOS 88 75  BILITOT 1.2 0.8  PROT 5.5* 4.5*  ALBUMIN 3.4* 2.8*     No results found for this or any previous visit (from the past 240 hour(s)).   Radiology Studies: No results found.  Scheduled Meds:  apixaban  5 mg Oral BID   atorvastatin  20 mg Oral QHS   buPROPion  300 mg Oral Daily   colestipol  1 g Oral QPM   donepezil  5 mg Oral QHS   metoprolol tartrate  12.5 mg Oral BID   pantoprazole  40 mg Oral Daily   potassium chloride  20 mEq Oral Daily   sodium chloride flush  3 mL Intravenous Q12H   traZODone  100 mg Oral QHS   Continuous Infusions:  sodium chloride Stopped (01/27/23 0931)     LOS: 3 days   Time spent:  Azucena Fallen, DO  [facility]  If 7PM-7AM, please contact night-coverage [email or web address]  01/28/2023, 7:59 AM

## 2023-01-28 NOTE — Progress Notes (Signed)
   Subjective/Chief Complaint: Called to see patient while in the hospital.  He was scheduled for a post-op visit in my office on 6/28.  Tolerating diet.  No BM in 3 days.  Requiring MOM or some laxative to help with bowel movements.   Objective: Vital signs in last 24 hours: Temp:  [97.8 F (36.6 C)-98.4 F (36.9 C)] 97.8 F (36.6 C) (06/25 0531) Pulse Rate:  [62] 62 (06/25 0531) Resp:  [16] 16 (06/25 0531) BP: (134-139)/(77) 139/77 (06/25 0531) SpO2:  [97 %-98 %] 97 % (06/25 0531) Last BM Date : 01/26/23  Intake/Output from previous day: 06/24 0701 - 06/25 0700 In: 182.4 [I.V.:182.4] Out: 1450 [Urine:1450] Intake/Output this shift: Total I/O In: -  Out: 700 [Urine:700]  Elderly male Not oriented to place and situation Abd - soft, non-tender Incision well-healed with no sign of infection.  Lab Results:  Recent Labs    01/26/23 0704 01/28/23 0737  WBC 3.0* 2.5*  HGB 9.5* 10.7*  HCT 29.7* 31.6*  PLT 123* 119*   BMET Recent Labs    01/26/23 0704 01/28/23 0737  NA 141 138  K 3.7 3.5  CL 110 106  CO2 23 23  GLUCOSE 89 93  BUN 11 8  CREATININE 1.11 0.97  CALCIUM 8.3* 8.5*     Studies/Results: CT scan A/P 01/24/23 - no surgical complications  Anti-infectives: Anti-infectives (From admission, onward)    None       Assessment/Plan: S/p small bowel resection with primary anastomosis for perforated jejunal diverticulitis 12/18/22 No apparent surgical issues Chronic constipation - have ordered daily Miralax - would continue unless patient is having diarrhea. Follow-up with me PRN.   LOS: 3 days    Wynona Luna 01/28/2023

## 2023-01-29 DIAGNOSIS — R55 Syncope and collapse: Secondary | ICD-10-CM | POA: Diagnosis not present

## 2023-01-29 MED ORDER — METOPROLOL TARTRATE 25 MG PO TABS: 12.5000 mg | ORAL_TABLET | Freq: Two times a day (BID) | ORAL | Status: DC

## 2023-01-29 NOTE — TOC Progression Note (Signed)
Transition of Care [facility]) - Initial/Assessment Note    Patient Details  Name: Jonathon Parker [facility] MRN: [id number] Date of Birth: [birth year]  Transition of Care [facility]) CM/SW Contact:    Ralene Bathe, LCSW Phone Number: 01/29/2023, 11:25 AM  Clinical Narrative:                 LCSW contacted [place] with [facility] (SNF).  The insurance company has requested more clinicals.  LCSW faxed PT/OT notes to facility.  Insurance authorization is still pending.   TOC following.    Expected Discharge Plan: Skilled Nursing Facility Barriers to Discharge: Continued Medical Work up, English as a second language teacher, SNF Pending bed offer   Patient Goals and CMS Choice Patient states their goals for this hospitalization and ongoing recovery are:: Rehab CMS Medicare.gov Compare Post Acute Care list provided to:: Patient Choice offered to / list presented to : Patient [place] ownership interest in [facility].provided to:: Patient    Expected Discharge Plan and Services In-house Referral: Clinical Social Work   Post Acute Care Choice: Skilled Nursing Facility Living arrangements for the past 2 months: Assisted Living Facility                                      Prior Living Arrangements/Services Living arrangements for the past 2 months: Assisted Living Facility Lives with:: Spouse Patient language and need for interpreter reviewed:: Yes Do you feel safe going back to the place where you live?: Yes      Need for Family Participation in Patient Care: No (Comment) Care giver support system in place?: Yes (comment)   Criminal Activity/Legal Involvement Pertinent to Current Situation/Hospitalization: No - Comment as needed  Activities of Daily Living Home Assistive Devices/Equipment: Dan Humphreys (specify type) ADL Screening (condition at time of admission) Patient's cognitive ability adequate to safely complete daily activities?: Yes Is the patient deaf or have difficulty hearing?:  No Does the patient have difficulty seeing, even when wearing glasses/contacts?: No Does the patient have difficulty concentrating, remembering, or making decisions?: Yes Patient able to express need for assistance with ADLs?: Yes Does the patient have difficulty dressing or bathing?: Yes Independently performs ADLs?: No Communication: Independent Dressing (OT): Needs assistance Is this a change from baseline?: Pre-admission baseline Grooming: Independent Feeding: Independent Bathing: Needs assistance Is this a change from baseline?: Pre-admission baseline Toileting: Needs assistance Is this a change from baseline?: Pre-admission baseline In/Out Bed: Dependent Is this a change from baseline?: Pre-admission baseline Walks in Home: Needs assistance Is this a change from baseline?: Pre-admission baseline Does the patient have difficulty walking or climbing stairs?: Yes Weakness of Legs: Both Weakness of Arms/Hands: Both  Permission Sought/Granted Permission sought to share information with : Facility Medical sales representative, Family Supports Permission granted to share information with : Yes, Verbal Permission Granted     Permission granted to share info w AGENCY: SNFS  Permission granted to share info w Relationship: Spouse     Emotional Assessment Appearance:: Appears stated age Attitude/Demeanor/Rapport: Engaged Affect (typically observed): Accepting, Appropriate Orientation: : Oriented to Self, Oriented to Place, Oriented to  Time, Oriented to Situation Alcohol / Substance Use: Not Applicable Psych Involvement: No (comment)  Admission diagnosis:  Syncope and collapse [R55] Atrial fibrillation with rapid ventricular response (HCC) [I48.91] Syncope, unspecified syncope type [R55] Patient Active Problem List   Diagnosis Date Noted   Syncope and collapse 01/24/2023   AKI (acute kidney injury) (  HCC) 01/24/2023   Paroxysmal atrial fibrillation (HCC) 01/24/2023   Macrocytic  anemia 01/24/2023   Bowel perforation (HCC) 01/24/2023   Dementia without behavioral disturbance (HCC) 01/24/2023   Status post placement of cardiac pacemaker 01/24/2023   Palliative care by specialist 12/29/2022   Malnutrition of moderate degree 12/18/2022   Enteritis 12/15/2022   Acute ischemic VBA thalamic stroke, right (HCC) 11/24/2020   Mild intermittent asthma 01/28/2020   Dyspnea 05/11/2019   Thrombocytosis 05/11/2019   Alcohol dependence (HCC) 05/11/2019   Abnormal findings on diagnostic imaging of gall bladder 05/10/2019   Essential hypertension 04/22/2019   Post-operative complication 04/21/2019   Acute respiratory failure with hypoxia (HCC) 04/21/2019   SIRS (systemic inflammatory response syndrome) (HCC) 04/21/2019   Status post laparoscopic cholecystectomy 04/21/2019   HCAP (healthcare-associated pneumonia) 04/21/2019   CLL (chronic lymphocytic leukemia) (HCC) 04/21/2019   Calculous cholecystitis 02/28/2019   Campylobacter diarrhea    Diarrhea of infectious origin    STEC (Shiga toxin-producing Escherichia coli)    Elevated LFTs    Hypokalemia    Gastroenteritis 01/19/2017   Sepsis (HCC) 01/19/2017   GERD (gastroesophageal reflux disease) 01/19/2017   Depression 01/19/2017   PCP:  Paulina Fusi, MD Pharmacy:   [address] [place], [place] - [address] [place] [address] Phone: [phone] Fax: [phone]  [id number] Rosalita Levan, [place] - [address] AT [facility] [address] [place] [address] Phone: [phone] Fax: [phone]     Social Determinants of Health (SDOH) Social History: SDOH Screenings   Food Insecurity: No Food Insecurity (01/25/2023)  Housing: Low Risk  (01/25/2023)  Transportation Needs: No Transportation Needs (01/25/2023)  Utilities: Not At Risk (01/25/2023)  Tobacco Use: Low Risk  (01/24/2023)   SDOH Interventions:     Readmission  Risk Interventions     No data to display

## 2023-01-29 NOTE — Care Management Important Message (Signed)
Important Message  Patient Details  Name: Jonathon Parker [facility] MRN: [id number] Date of Birth: 08-22-1950   Medicare Important Message Given:  Yes     Renie Ora 01/29/2023, 8:11 AM

## 2023-01-29 NOTE — Plan of Care (Signed)
  Problem: Education: Goal: Knowledge of General Education information will improve Description: Including pain rating scale, medication(s)/side effects and non-pharmacologic comfort measures Outcome: Progressing   Problem: Health Behavior/Discharge Planning: Goal: Ability to manage health-related needs will improve Outcome: Progressing   Problem: Clinical Measurements: Goal: Ability to maintain clinical measurements within normal limits will improve Outcome: Progressing   Problem: Clinical Measurements: Goal: Will remain free from infection Outcome: Progressing   Problem: Clinical Measurements: Goal: Respiratory complications will improve Outcome: Progressing   Problem: Activity: Goal: Risk for activity intolerance will decrease Outcome: Progressing   Problem: Nutrition: Goal: Adequate nutrition will be maintained Outcome: Progressing   Problem: Safety: Goal: Ability to remain free from injury will improve Outcome: Progressing

## 2023-01-29 NOTE — Progress Notes (Signed)
   01/28/23 2300  BiPAP/CPAP/SIPAP  BiPAP/CPAP/SIPAP Pt Type Adult  Reason BIPAP/CPAP not in use Non-compliant

## 2023-01-29 NOTE — Progress Notes (Signed)
Occupational Therapy Treatment Patient Details Name: Jonathon Parker [facility] MRN: [id number] DOB: 09-29-1950 Today's Date: 01/29/2023   History of present illness 72 y.o. male presents to [facility] hospital on 01/24/2023 after possible syncopal episode and fall. Pt found to be in afib with RVR in ED. PMH - HTN, R thalamic CVA, colon CA, CLL, GERD, Depression, enteritis, sepsis, mild cognitive impairment, pacer.   OT comments  Patient making progress with OT treatment with LB bathing and dressing. Patient continues to require min/mod assist for bed mobility and mod assist to power up. Once up patient was min assist for mobility and transfers. Patient performed bathing and dressing seated at sink and stood for peri area bathing. Patient quickly fatigues and required increased time to complete. Patient will benefit from continued inpatient follow up therapy, <3 hours/day to address bathing, dressing, and toilet transfers. Acute OT to continue to follow.    Recommendations for follow up therapy are one component of a multi-disciplinary discharge planning process, led by the attending physician.  Recommendations may be updated based on patient status, additional functional criteria and insurance authorization.    Assistance Recommended at Discharge Frequent or constant Supervision/Assistance  Patient can return home with the following  A lot of help with bathing/dressing/bathroom;Assistance with cooking/housework;Assist for transportation;Assistance with feeding;Help with stairs or ramp for entrance;A lot of help with walking and/or transfers   Equipment Recommendations  BSC/3in1    Recommendations for Other Services      Precautions / Restrictions Precautions Precautions: Fall Precaution Comments: afib rvr Restrictions Weight Bearing Restrictions: No       Mobility Bed Mobility Overal bed mobility: Needs Assistance Bed Mobility: Rolling, Sidelying to Sit Rolling: Min assist Sidelying to sit: Mod  assist       General bed mobility comments: initiates but required assistance with trunk, BLEs, and scooting to EOB    Transfers Overall transfer level: Needs assistance Equipment used: Rolling walker (2 wheels) Transfers: Sit to/from Stand, Bed to chair/wheelchair/BSC Sit to Stand: Mod assist           General transfer comment: mod assist to power up and min assist once up with RW, cues for hand placement     Balance Overall balance assessment: Needs assistance Sitting-balance support: Feet supported, Bilateral upper extremity supported Sitting balance-Leahy Scale: Poor Sitting balance - Comments: min guard to min assist for sitting balance on EOB Postural control: Right lateral lean Standing balance support: Single extremity supported, Bilateral upper extremity supported, During functional activity Standing balance-Leahy Scale: Poor Standing balance comment: able to use one extremity support when performing self care tasks while standing                           ADL either performed or assessed with clinical judgement   ADL Overall ADL's : Needs assistance/impaired     Grooming: Wash/dry hands;Wash/dry face;Oral care;Supervision/safety;Sitting Grooming Details (indicate cue type and reason): at sink Upper Body Bathing: Sitting;Set up;Supervision/ safety   Lower Body Bathing: Moderate assistance;Sit to/from stand Lower Body Bathing Details (indicate cue type and reason): assistance with feet and to complete peri area bathing Upper Body Dressing : Sitting;Set up;Supervision/safety Upper Body Dressing Details (indicate cue type and reason): to change gown Lower Body Dressing: Sitting/lateral leans;Moderate assistance Lower Body Dressing Details (indicate cue type and reason): able to assist with doffing but required assistance to get socks over toes  General ADL Comments: required cues to keep head up, fatigued at end of session required  increased time to complete    Extremity/Trunk Assessment              Vision       Perception     Praxis      Cognition Arousal/Alertness: Awake/alert Behavior During Therapy: Flat affect Overall Cognitive Status: Impaired/Different from baseline Area of Impairment: Attention, Memory, Following commands, Safety/judgement, Awareness, Problem solving                 Orientation Level: Disoriented to, Time, Situation, Place Current Attention Level: Selective Memory: Decreased short-term memory Following Commands: Follows one step commands consistently, Follows multi-step commands with increased time Safety/Judgement: Decreased awareness of safety, Decreased awareness of deficits Awareness: Emergent Problem Solving: Slow processing, Requires verbal cues General Comments: flat affect but jovial and pleasant        Exercises      Shoulder Instructions       General Comments      Pertinent Vitals/ Pain       Pain Assessment Pain Assessment: Faces Faces Pain Scale: No hurt Pain Intervention(s): Monitored during session  Home Living                                          Prior Functioning/Environment              Frequency  Min 2X/week        Progress Toward Goals  OT Goals(current goals can now be found in the care plan section)  Progress towards OT goals: Progressing toward goals  Acute Rehab OT Goals Patient Stated Goal: get better OT Goal Formulation: With patient Time For Goal Achievement: 02/07/23 Potential to Achieve Goals: Good ADL Goals Pt Will Perform Grooming: standing;with min guard assist Pt Will Perform Lower Body Dressing: sitting/lateral leans;with supervision Pt Will Transfer to Toilet: ambulating;with supervision Additional ADL Goal #1: Pt will demonstrate use of energy conservation strategies prn with functional mobility with min cueing Additional ADL Goal #3: Pt will demonstrate compliance with use  of log rolling for bed mobility and pressure relief withphysical  Min A Additional ADL Goal #4: Pt will complete lateral scooting to/from recliner with Min A  Plan Discharge plan remains appropriate    Co-evaluation                 AM-PAC OT "6 Clicks" Daily Activity     Outcome Measure   Help from another person eating meals?: None Help from another person taking care of personal grooming?: A Little Help from another person toileting, which includes using toliet, bedpan, or urinal?: A Little Help from another person bathing (including washing, rinsing, drying)?: A Lot Help from another person to put on and taking off regular upper body clothing?: None Help from another person to put on and taking off regular lower body clothing?: A Lot 6 Click Score: 18    End of Session Equipment Utilized During Treatment: Gait belt;Rolling walker (2 wheels)  OT Visit Diagnosis: Muscle weakness (generalized) (M62.81);Unsteadiness on feet (R26.81);History of falling (Z91.81)   Activity Tolerance Patient tolerated treatment well   Patient Left in chair;with call bell/phone within reach;with chair alarm set   Nurse Communication Mobility status        Time: 6301-6010 OT Time Calculation (min): 38 min  Charges: OT General Charges $OT  Visit: 1 Visit OT Treatments $Self Care/Home Management : 38-52 mins  Alfonse Flavors, OTA Acute Rehabilitation Services  Office [phone]   Dewain Penning 01/29/2023, 2:20 PM

## 2023-01-29 NOTE — Progress Notes (Signed)
PROGRESS NOTE    Jonathon Parker [facility]  VQQ:[id number] DOB: 19-Aug-1950 DOA: 01/24/2023 PCP: Paulina Fusi, MD   Brief Narrative:  72 y.o. male who presents from assisted living facility with medical history significant of  history of CLL, paroxysmal A-fib on Eliquis, tachybradycardia syndrome  s/p PPM, history of colon cancer s/p hemicolectomy more than 10 years ago, GERD who presents after having a fall yesterday while in the shower with suspected syncope/collapse after prodrome of lightheadedness/dizziness.    Assessment & Plan:  Principal Problem:   Syncope and collapse Active Problems:   Paroxysmal atrial fibrillation (HCC)   Status post placement of cardiac pacemaker   AKI (acute kidney injury) (HCC)   Macrocytic anemia   Essential hypertension   Bowel perforation (HCC)   Depression   Dementia without behavioral disturbance (HCC)   GERD (gastroesophageal reflux disease)        Suspected syncope and collapse Likely orthostatic hypotension in etiology, POA Significant imbalance issue.  CT of the head negative, EEG within normal limits.  Encourage oral and take, metoprolol reduced to 12.5 mg twice daily.  I had thigh-high compression stockings    Paroxysmal atrial fibrillation on chronic anticoagulation Tachybradycardia syndrome s/p PPM -A-fib with transient RVR at intake likely secondary to orthostatic hypotension -Status post permanent pacemaker April 2024 Initially was on Cardizem drip now weaned off to p.o. metoprolol.  Continue Eliquis.   Acute kidney injury - resolving Baseline creatinine 0.9, encourage oral intake.   Macrocytic anemia Chronic.  No acute indication for treatment, followed labs as appropriate.   Essential hypertension Lopressor 12.5mg  bid. IV prn   History of bowel perforation, recent Patient hospitalized last month requiring explorative laparotomy with lysis of adhesions, small bowel resection with primary anastomosis for contained  perforation of the jejunal diverticulum   Depression Wellbutrin XL   Dementia, without mood disturbances Aricept   GERD PPI & Carafate.    OSA on CPAP -Continue CPAP at night  PT/OT = SNF   DVT prophylaxis: apixaban (ELIQUIS) tablet 5 mg  Code Status:   Full Family Communication: Spouse at bedside   Status is: Inpt   Dispo: The patient is from: Home              Anticipated d/c is to: Home              Anticipated d/c date is: Pending placement              Patient currently medically stable for discharge awaiting safe disposition at skilled nursing facility..      Diet Orders (From admission, onward)     Start     Ordered   01/26/23 0921  Diet regular Room service appropriate? Yes; Fluid consistency: Thin  Diet effective now       Question Answer Comment  Room service appropriate? Yes   Fluid consistency: Thin      01/26/23 0921            Subjective:  Sitting up in the chair, no complaints  Examination:  General exam: Appears calm and comfortable  Respiratory system: Clear to auscultation. Respiratory effort normal. Cardiovascular system: S1 & S2 heard, RRR. No JVD, murmurs, rubs, gallops or clicks. No pedal edema. Gastrointestinal system: Abdomen is nondistended, soft and nontender. No organomegaly or masses felt. Normal bowel sounds heard. Central nervous system: Alert and oriented. No focal neurological deficits. Extremities: Symmetric 5 x 5 power. Skin: No rashes, lesions or ulcers Psychiatry: Judgement and insight appear normal.  Mood & affect appropriate.  Objective: Vitals:   01/28/23 2040 01/29/23 0050 01/29/23 0600 01/29/23 0811  BP: 100/74 118/75 (!) 140/77 133/75  Pulse:  60 63 60  Resp: 16 20 20    Temp: 98.9 F (37.2 C) 97.7 F (36.5 C) 97.9 F (36.6 C) 98.3 F (36.8 C)  TempSrc: Oral Oral Oral Oral  SpO2: 98% 95% 98%   Weight:      Height:        Intake/Output Summary (Last 24 hours) at 01/29/2023 0925 Last data filed at  01/29/2023 0051 Gross per 24 hour  Intake --  Output 1150 ml  Net -1150 ml   Filed Weights   01/24/23 1058  Weight: 63.5 kg    Scheduled Meds:  apixaban  5 mg Oral BID   atorvastatin  20 mg Oral QHS   buPROPion  300 mg Oral Daily   colestipol  1 g Oral QPM   donepezil  5 mg Oral QHS   metoprolol tartrate  12.5 mg Oral BID   pantoprazole  40 mg Oral Daily   polyethylene glycol  17 g Oral Daily   potassium chloride  20 mEq Oral Daily   sodium chloride flush  3 mL Intravenous Q12H   traZODone  100 mg Oral QHS   Continuous Infusions:  Nutritional status     Body mass index is 18.99 kg/m.  Data Reviewed:   CBC: Recent Labs  Lab 01/24/23 1114 01/24/23 1126 01/25/23 0252 01/26/23 0704 01/28/23 0737  WBC 4.8  --  3.9* 3.0* 2.5*  NEUTROABS 2.3  --   --   --   --   HGB 12.4* 12.2* 10.4* 9.5* 10.7*  HCT 39.4 36.0* 32.2* 29.7* 31.6*  MCV 102.6*  --  96.1 97.7 95.5  PLT 150  --  140* 123* 119*   Basic Metabolic Panel: Recent Labs  Lab 01/24/23 1114 01/24/23 1126 01/24/23 1352 01/25/23 0252 01/26/23 0704 01/28/23 0737  NA 140 140  --  141 141 138  K 3.7 3.7  --  3.7 3.7 3.5  CL 109 108  --  106 110 106  CO2 21*  --   --  22 23 23   GLUCOSE 94 95  --  96 89 93  BUN 16 18  --  13 11 8   CREATININE 1.33* 1.20  --  1.18 1.11 0.97  CALCIUM 9.0  --   --  8.5* 8.3* 8.5*  MG  --   --  1.9  --   --   --    GFR: Estimated Creatinine Clearance: 61.8 mL/min (by C-G formula based on SCr of 0.97 mg/dL). Liver Function Tests: Recent Labs  Lab 01/24/23 1114 01/25/23 0252  AST 29 18  ALT 22 19  ALKPHOS 88 75  BILITOT 1.2 0.8  PROT 5.5* 4.5*  ALBUMIN 3.4* 2.8*   No results for input(s): "LIPASE", "AMYLASE" in the last 168 hours. No results for input(s): "AMMONIA" in the last 168 hours. Coagulation Profile: No results for input(s): "INR", "PROTIME" in the last 168 hours. Cardiac Enzymes: No results for input(s): "CKTOTAL", "CKMB", "CKMBINDEX", "TROPONINI" in the  last 168 hours. BNP (last 3 results) No results for input(s): "PROBNP" in the last 8760 hours. HbA1C: No results for input(s): "HGBA1C" in the last 72 hours. CBG: No results for input(s): "GLUCAP" in the last 168 hours. Lipid Profile: No results for input(s): "CHOL", "HDL", "LDLCALC", "TRIG", "CHOLHDL", "LDLDIRECT" in the last 72 hours. Thyroid Function Tests: No results for input(s): "  TSH", "T4TOTAL", "FREET4", "T3FREE", "THYROIDAB" in the last 72 hours. Anemia Panel: No results for input(s): "VITAMINB12", "FOLATE", "FERRITIN", "TIBC", "IRON", "RETICCTPCT" in the last 72 hours. Sepsis Labs: No results for input(s): "PROCALCITON", "LATICACIDVEN" in the last 168 hours.  No results found for this or any previous visit (from the past 240 hour(s)).       Radiology Studies: No results found.         LOS: 4 days   Time spent= 35 mins    Arlean Thies Joline Maxcy, MD [facility]  If 7PM-7AM, please contact night-coverage  01/29/2023, 9:25 AM

## 2023-01-30 DIAGNOSIS — R2689 Other abnormalities of gait and mobility: Secondary | ICD-10-CM | POA: Diagnosis not present

## 2023-01-30 DIAGNOSIS — F5105 Insomnia due to other mental disorder: Secondary | ICD-10-CM | POA: Diagnosis not present

## 2023-01-30 DIAGNOSIS — D649 Anemia, unspecified: Secondary | ICD-10-CM | POA: Diagnosis not present

## 2023-01-30 DIAGNOSIS — R29898 Other symptoms and signs involving the musculoskeletal system: Secondary | ICD-10-CM | POA: Diagnosis not present

## 2023-01-30 DIAGNOSIS — Z7401 Bed confinement status: Secondary | ICD-10-CM | POA: Diagnosis not present

## 2023-01-30 DIAGNOSIS — I10 Essential (primary) hypertension: Secondary | ICD-10-CM | POA: Diagnosis not present

## 2023-01-30 DIAGNOSIS — M6281 Muscle weakness (generalized): Secondary | ICD-10-CM | POA: Diagnosis not present

## 2023-01-30 DIAGNOSIS — F4321 Adjustment disorder with depressed mood: Secondary | ICD-10-CM | POA: Diagnosis not present

## 2023-01-30 DIAGNOSIS — R55 Syncope and collapse: Secondary | ICD-10-CM | POA: Diagnosis not present

## 2023-01-30 DIAGNOSIS — I119 Hypertensive heart disease without heart failure: Secondary | ICD-10-CM | POA: Diagnosis not present

## 2023-01-30 DIAGNOSIS — F03B0 Unspecified dementia, moderate, without behavioral disturbance, psychotic disturbance, mood disturbance, and anxiety: Secondary | ICD-10-CM | POA: Diagnosis not present

## 2023-01-30 DIAGNOSIS — I480 Paroxysmal atrial fibrillation: Secondary | ICD-10-CM | POA: Diagnosis not present

## 2023-01-30 DIAGNOSIS — I4891 Unspecified atrial fibrillation: Secondary | ICD-10-CM | POA: Diagnosis not present

## 2023-01-30 DIAGNOSIS — R531 Weakness: Secondary | ICD-10-CM | POA: Diagnosis not present

## 2023-01-30 DIAGNOSIS — R262 Difficulty in walking, not elsewhere classified: Secondary | ICD-10-CM | POA: Diagnosis not present

## 2023-01-30 DIAGNOSIS — F0390 Unspecified dementia without behavioral disturbance: Secondary | ICD-10-CM | POA: Diagnosis not present

## 2023-01-30 LAB — BASIC METABOLIC PANEL
Anion gap: 5 (ref 5–15)
BUN: 10 mg/dL (ref 8–23)
CO2: 24 mmol/L (ref 22–32)
Calcium: 8.4 mg/dL — ABNORMAL LOW (ref 8.9–10.3)
Chloride: 108 mmol/L (ref 98–111)
Creatinine, Ser: 1.03 mg/dL (ref 0.61–1.24)
GFR, Estimated: >60 mL/min (ref >60)
Glucose, Bld: 105 mg/dL — ABNORMAL HIGH (ref 70–99)
Potassium: 3.5 mmol/L (ref 3.5–5.1)
Sodium: 137 mmol/L (ref 135–145)

## 2023-01-30 LAB — CBC
HCT: 32.8 % — ABNORMAL LOW (ref 39.0–52.0)
Hemoglobin: 10.6 g/dL — ABNORMAL LOW (ref 13.0–17.0)
MCH: 31.2 pg (ref 26.0–34.0)
MCHC: 32.3 g/dL (ref 30.0–36.0)
MCV: 96.5 fL (ref 80.0–100.0)
Platelets: 136 10*3/uL — ABNORMAL LOW (ref 150–400)
RBC: 3.4 MIL/uL — ABNORMAL LOW (ref 4.22–5.81)
RDW: 14.8 % (ref 11.5–15.5)
WBC: 3.9 10*3/uL — ABNORMAL LOW (ref 4.0–10.5)
nRBC: 0 % (ref 0.0–0.2)

## 2023-01-30 NOTE — Plan of Care (Signed)
  Problem: Education: Goal: Knowledge of General Education information will improve Description: Including pain rating scale, medication(s)/side effects and non-pharmacologic comfort measures Outcome: Progressing   Problem: Health Behavior/Discharge Planning: Goal: Ability to manage health-related needs will improve Outcome: Progressing   Problem: Clinical Measurements: Goal: Ability to maintain clinical measurements within normal limits will improve Outcome: Progressing   Problem: Clinical Measurements: Goal: Will remain free from infection Outcome: Progressing   Problem: Clinical Measurements: Goal: Diagnostic test results will improve Outcome: Progressing   Problem: Clinical Measurements: Goal: Cardiovascular complication will be avoided Outcome: Progressing   Problem: Activity: Goal: Risk for activity intolerance will decrease Outcome: Progressing   Problem: Nutrition: Goal: Adequate nutrition will be maintained Outcome: Progressing   Problem: Coping: Goal: Level of anxiety will decrease Outcome: Progressing

## 2023-01-30 NOTE — Discharge Summary (Signed)
Physician Discharge Summary  Jonathon Parker [facility] NFA:[id number] DOB: [birth year] DOA: 01/24/2023  PCP: Paulina Fusi, MD  Admit date: 01/24/2023 Discharge date: 01/30/2023  Admitted From: Home Disposition:  SNF  Recommendations for Outpatient Follow-up:  Follow up with PCP in 1-2 weeks Please obtain BMP/CBC in one week your next doctors visit.  Metoprolol reduced to 12.5 mg twice daily Advised thigh-high compression stockings   Discharge Condition: Stable CODE STATUS: Full code Diet recommendation: Cardiac  Brief/Interim Summary: 72 y.o. male who presents from assisted living facility with medical history significant of  history of CLL, paroxysmal A-fib on Eliquis, tachybradycardia syndrome  s/p PPM, history of colon cancer s/p hemicolectomy more than 10 years ago, GERD who presents after having a fall yesterday while in the shower with suspected syncope/collapse after prodrome of lightheadedness/dizziness. CT head, cervical spine and abdomen pelvis were unremarkable. During hospitalization patient had CT head which was negative, EEG was within normal limits.  Due to some orthostasis his metoprolol dose was reduced to 12.5 mg twice daily.  Ordered thigh-high compression stockings.  These medications and interventions can further be adjusted as deemed necessary.  PT/OT recommended SNF.  Currently he is awaiting placement.     Assessment & Plan:  Principal Problem:   Syncope and collapse Active Problems:   Paroxysmal atrial fibrillation (HCC)   Status post placement of cardiac pacemaker   AKI (acute kidney injury) (HCC)   Macrocytic anemia   Essential hypertension   Bowel perforation (HCC)   Depression   Dementia without behavioral disturbance (HCC)   GERD (gastroesophageal reflux disease)          Suspected syncope and collapse Likely orthostatic hypotension in etiology, POA Significant imbalance issue.  CT of the head negative, EEG within normal limits.  Encourage  oral and take, metoprolol reduced to 12.5 mg twice daily. During admission he was also orthostatic but this improved throughout the hospitalization. I had thigh-high compression stockings.  Eventually if necessary can use abdominal binder especially with mobility.  Continue to encourage to use precautions.  Also can discuss with outpatient provider regarding any further adjustments to his treatment     Paroxysmal atrial fibrillation on chronic anticoagulation Tachybradycardia syndrome s/p PPM -A-fib with transient RVR at intake likely secondary to orthostatic hypotension -Status post permanent pacemaker April 2024.  Follows with outpatient Dr. Nanetta Batty.  Previously determined to be a poor amiodarone candidate due to concerns of restrictive lung disease. Initially was on Cardizem drip now weaned off to p.o. metoprolol.  Continue Eliquis.   Acute kidney injury - resolving Baseline creatinine 0.9, encourage oral intake.   Macrocytic anemia Chronic.  No acute indication for treatment, followed labs as appropriate.   Essential hypertension Lopressor 12.5mg  bid.    History of bowel perforation, recent Patient hospitalized last month requiring explorative laparotomy with lysis of adhesions, small bowel resection with primary anastomosis for contained perforation of the jejunal diverticulum   Depression Wellbutrin XL   Dementia, without mood disturbances Aricept   GERD PPI & Carafate.    OSA on CPAP -Continue CPAP at night   PT/OT = SNF    Discharge Diagnoses:  Principal Problem:   Syncope and collapse Active Problems:   Paroxysmal atrial fibrillation (HCC)   Status post placement of cardiac pacemaker   AKI (acute kidney injury) (HCC)   Macrocytic anemia   Essential hypertension   Bowel perforation (HCC)   Depression   Dementia without behavioral disturbance (HCC)   GERD (gastroesophageal reflux disease)  Consultations: None  Subjective: Feeling okay no  complaints at this time.   Discharge Exam: Vitals:   01/29/23 2041 01/30/23 0437  BP: 119/68 115/80  Pulse: 61 61  Resp: 17 20  Temp: 98.7 F (37.1 C) 98.7 F (37.1 C)  SpO2: 100% 100%   Vitals:   01/29/23 0811 01/29/23 1340 01/29/23 2041 01/30/23 0437  BP: 133/75 104/66 119/68 115/80  Pulse: 60 60 61 61  Resp:   17 20  Temp: 98.3 F (36.8 C) 98 F (36.7 C) 98.7 F (37.1 C) 98.7 F (37.1 C)  TempSrc: Oral Oral Oral Oral  SpO2:   100% 100%  Weight:      Height:        General: Pt is alert, awake, not in acute distress Cardiovascular: RRR, S1/S2 +, no rubs, no gallops Respiratory: CTA bilaterally, no wheezing, no rhonchi Abdominal: Soft, NT, ND, bowel sounds + Extremities: no edema, no cyanosis  Discharge Instructions   Allergies as of 01/30/2023       Reactions   Lisinopril Cough   Seroquel [quetiapine] Other (See Comments)   Zolpidem Other (See Comments)   "Got me in outer space"         Medication List     STOP taking these medications    haloperidol 2 MG tablet Commonly known as: HALDOL   haloperidol 5 MG tablet Commonly known as: HALDOL       TAKE these medications    acetaminophen 500 MG tablet Commonly known as: TYLENOL Take 1,000 mg by mouth every 6 (six) hours as needed for moderate pain or mild pain.   albuterol 108 (90 Base) MCG/ACT inhaler Commonly known as: VENTOLIN HFA Inhale 2 puffs into the lungs every 6 (six) hours as needed for wheezing or shortness of breath.   apixaban 5 MG Tabs tablet Commonly known as: ELIQUIS Take 5 mg by mouth 2 (two) times daily.   atorvastatin 20 MG tablet Commonly known as: LIPITOR Take 20 mg by mouth daily.   buPROPion 300 MG 24 hr tablet Commonly known as: WELLBUTRIN XL Take 1 tablet (300 mg total) by mouth daily.   cholecalciferol 25 MCG (1000 UNIT) tablet Commonly known as: VITAMIN D3 Take 2 tablets (2,000 Units total) by mouth daily. Takes 2 tablets (1,000 units each) for a total of  2,000 units daily What changed: additional instructions   colestipol 1 g tablet Commonly known as: COLESTID Take 1 g by mouth every evening.   donepezil 5 MG tablet Commonly known as: ARICEPT Take 1 tablet (5 mg total) by mouth daily. What changed: when to take this   melatonin 3 MG Tabs tablet Take 1 tablet (3 mg total) by mouth daily at 8 pm.   metoprolol tartrate 25 MG tablet Commonly known as: LOPRESSOR Take 0.5 tablets (12.5 mg total) by mouth 2 (two) times daily. What changed:  medication strength how much to take   ondansetron 8 MG tablet Commonly known as: ZOFRAN Take 8 mg by mouth every 8 (eight) hours as needed for nausea or vomiting.   pantoprazole 40 MG tablet Commonly known as: PROTONIX Take 40 mg by mouth daily.   polyethylene glycol 17 g packet Commonly known as: MIRALAX / GLYCOLAX Take 17 g by mouth daily.   potassium chloride 10 MEQ tablet Commonly known as: KLOR-CON Take 20 mEq by mouth daily.   sucralfate 1 g tablet Commonly known as: CARAFATE Take 1 g by mouth daily as needed (stomach discomfort).   traZODone 100  MG tablet Commonly known as: DESYREL Take 1 tablet (100 mg total) by mouth at bedtime.        Contact information for follow-up providers     Paulina Fusi, MD Follow up in 1 week(s).   Specialty: Internal Medicine Contact information: [address] [place] [address] [phone]              Contact information for after-discharge care     Destination     [facility] [place] Preferred SNF .   Service: Skilled Nursing Contact information: [address] [place] [address] [phone]                    Allergies  Allergen Reactions   Lisinopril Cough   Seroquel [Quetiapine] Other (See Comments)   Zolpidem Other (See Comments)    "Got me in outer space"     You were cared for by a hospitalist during your hospital stay. If you have  any questions about your discharge medications or the care you received while you were in the hospital after you are discharged, you can call the unit and asked to speak with the hospitalist on call if the hospitalist that took care of you is not available. Once you are discharged, your primary care physician will handle any further medical issues. Please note that no refills for any discharge medications will be authorized once you are discharged, as it is imperative that you return to your primary care physician (or establish a relationship with a primary care physician if you do not have one) for your aftercare needs so that they can reassess your need for medications and monitor your lab values.  You were cared for by a hospitalist during your hospital stay. If you have any questions about your discharge medications or the care you received while you were in the hospital after you are discharged, you can call the unit and asked to speak with the hospitalist on call if the hospitalist that took care of you is not available. Once you are discharged, your primary care physician will handle any further medical issues. Please note that NO REFILLS for any discharge medications will be authorized once you are discharged, as it is imperative that you return to your primary care physician (or establish a relationship with a primary care physician if you do not have one) for your aftercare needs so that they can reassess your need for medications and monitor your lab values.  Please request your Prim.MD to go over all Hospital Tests and Procedure/Radiological results at the follow up, please get all Hospital records sent to your Prim MD by signing hospital release before you go home.  Get CBC, CMP, 2 view Chest X ray checked  by Primary MD during your next visit or SNF MD in 5-7 days ( we routinely change or add medications that can affect your baseline labs and fluid status, therefore we recommend that you get the  mentioned basic workup next visit with your PCP, your PCP may decide not to get them or add new tests based on their clinical decision)  On your next visit with your primary care physician please Get Medicines reviewed and adjusted.  If you experience worsening of your admission symptoms, develop shortness of breath, life threatening emergency, suicidal or homicidal thoughts you must seek medical attention immediately by calling 911 or calling your MD immediately  if symptoms less severe.  You Must read  complete instructions/literature along with all the possible adverse reactions/side effects for all the Medicines you take and that have been prescribed to you. Take any new Medicines after you have completely understood and accpet all the possible adverse reactions/side effects.   Do not drive, operate heavy machinery, perform activities at heights, swimming or participation in water activities or provide baby sitting services if your were admitted for syncope or siezures until you have seen by Primary MD or a Neurologist and advised to do so again.  Do not drive when taking Pain medications.   Procedures/Studies: EEG adult  Result Date: 2023-02-17 Charlsie Quest, MD     02/17/2023  7:29 AM Patient Name: Arland Usery [facility] MRN: [id number] Epilepsy Attending: Charlsie Quest Referring Physician/Provider: Clydie Braun, MD Date: 2023/02/17 Duration: 27.24 mins Patient history: 72yo M with syncope getting eeg to evaluate for seizure. Level of alertness: Awake, asleep AEDs during EEG study: None Technical aspects: This EEG study was done with scalp electrodes positioned according to the 10-20 International system of electrode placement. Electrical activity was reviewed with band pass filter of 1-70Hz , sensitivity of 7 uV/mm, display speed of 81mm/sec with a 60Hz  notched filter applied as appropriate. EEG data were recorded continuously and digitally stored.  Video monitoring was available and  reviewed as appropriate. Description: The posterior dominant rhythm consists of 7.5 Hz activity of moderate voltage (25-35 uV) seen predominantly in posterior head regions, symmetric and reactive to eye opening and eye closing. Sleep was characterized by vertex waves, sleep spindles (12 to 14 Hz), maximal frontocentral region. Physiologic photic driving was not seen during photic stimulation.  Hyperventilation was not performed.   IMPRESSION: This study is within normal limits. No seizures or epileptiform discharges were seen throughout the recording. A normal interictal EEG does not exclude the diagnosis of epilepsy. Charlsie Quest   CT Head Wo Contrast  Result Date: 01/24/2023 CLINICAL DATA:  Head trauma, minor (Age >= 65y); Neck trauma (Age >= 65y). EXAM: CT HEAD WITHOUT CONTRAST CT CERVICAL SPINE WITHOUT CONTRAST TECHNIQUE: Multidetector CT imaging of the head and cervical spine was performed following the standard protocol without intravenous contrast. Multiplanar CT image reconstructions of the cervical spine were also generated. RADIATION DOSE REDUCTION: This exam was performed according to the departmental dose-optimization program which includes automated exposure control, adjustment of the mA and/or kV according to patient size and/or use of iterative reconstruction technique. COMPARISON:  Head CT 12/25/2022. FINDINGS: CT HEAD FINDINGS Brain: No acute hemorrhage. 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 Cortical gray-white differentiation is otherwise preserved. No hydrocephalus or extra-axial collection. No mass effect or midline shift. Vascular: No hyperdense vessel or unexpected calcification. Skull: No calvarial fracture or suspicious bone lesion. Skull base is unremarkable. Sinuses/Orbits: Mild mucosal disease in the maxillary sinuses. Orbits and mastoids are unremarkable. Other: None. CT CERVICAL SPINE  FINDINGS Alignment: Normal. Skull base and vertebrae: No acute fracture. Normal craniocervical junction. No suspicious bone lesions. Soft tissues and spinal canal: No prevertebral fluid or swelling. No visible canal hematoma. Disc levels: Multilevel cervical spondylosis with at least mild spinal canal stenosis from C3-4 to C6-7. Upper chest: Unremarkable. Other: Atherosclerotic calcifications of the carotid bulbs. IMPRESSION: 1. No acute intracranial abnormality. Unchanged mild chronic small-vessel disease and age advanced volume loss. 2. No acute fracture or traumatic listhesis in the cervical spine. Multilevel cervical spondylosis with at least mild spinal canal stenosis from C3-4 to C6-7.  Electronically Signed   By: Orvan Falconer M.D.   On: 01/24/2023 12:52   CT Cervical Spine Wo Contrast  Result Date: 01/24/2023 CLINICAL DATA:  Head trauma, minor (Age >= 65y); Neck trauma (Age >= 65y). EXAM: CT HEAD WITHOUT CONTRAST CT CERVICAL SPINE WITHOUT CONTRAST TECHNIQUE: Multidetector CT imaging of the head and cervical spine was performed following the standard protocol without intravenous contrast. Multiplanar CT image reconstructions of the cervical spine were also generated. RADIATION DOSE REDUCTION: This exam was performed according to the departmental dose-optimization program which includes automated exposure control, adjustment of the mA and/or kV according to patient size and/or use of iterative reconstruction technique. COMPARISON:  Head CT 12/25/2022. FINDINGS: CT HEAD FINDINGS Brain: No acute hemorrhage. 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 Cortical gray-white differentiation is otherwise preserved. No hydrocephalus or extra-axial collection. No mass effect or midline shift. Vascular: No hyperdense vessel or unexpected calcification. Skull: No calvarial fracture or suspicious bone lesion. Skull base is  unremarkable. Sinuses/Orbits: Mild mucosal disease in the maxillary sinuses. Orbits and mastoids are unremarkable. Other: None. CT CERVICAL SPINE FINDINGS Alignment: Normal. Skull base and vertebrae: No acute fracture. Normal craniocervical junction. No suspicious bone lesions. Soft tissues and spinal canal: No prevertebral fluid or swelling. No visible canal hematoma. Disc levels: Multilevel cervical spondylosis with at least mild spinal canal stenosis from C3-4 to C6-7. Upper chest: Unremarkable. Other: Atherosclerotic calcifications of the carotid bulbs. IMPRESSION: 1. No acute intracranial abnormality. Unchanged mild chronic small-vessel disease and age advanced volume loss. 2. No acute fracture or traumatic listhesis in the cervical spine. Multilevel cervical spondylosis with at least mild spinal canal stenosis from C3-4 to C6-7. Electronically Signed   By: Orvan Falconer M.D.   On: 01/24/2023 12:52   CT ABDOMEN PELVIS W CONTRAST  Result Date: 01/24/2023 CLINICAL DATA:  Abdominal trauma, blunt. EXAM: CT ABDOMEN AND PELVIS WITH CONTRAST TECHNIQUE: Multidetector CT imaging of the abdomen and pelvis was performed using the standard protocol following bolus administration of intravenous contrast. RADIATION DOSE REDUCTION: This exam was performed according to the departmental dose-optimization program which includes automated exposure control, adjustment of the mA and/or kV according to patient size and/or use of iterative reconstruction technique. CONTRAST:  75mL OMNIPAQUE IOHEXOL 350 MG/ML SOLN COMPARISON:  12/15/2022 FINDINGS: Lower chest: Residual subsegmental atelectasis noted in the lung bases. The small pleural effusions have resolved in the interval. Hepatobiliary: No focal liver abnormality is seen. Status post cholecystectomy. No biliary dilatation. Pancreas: Unremarkable. No pancreatic ductal dilatation or surrounding inflammatory changes. Spleen: Normal in size without focal abnormality.  Adrenals/Urinary Tract: Adrenal glands are unremarkable. Kidneys are normal, without renal calculi, focal lesion, or hydronephrosis. Bladder is unremarkable. Stomach/Bowel: Stomach appears normal. No pathologic dilatation of the large or small bowel loops. No bowel wall thickening or inflammation. Signs of recent small bowel resection with enteroenteric anastomosis within the right upper quadrant of the abdomen. No signs of anastomotic dehiscence. Mild surrounding soft tissue stranding is favored to represent residual postoperative inflammation. Postsurgical changes from remote APR and appendectomy. Vascular/Lymphatic: Aortic atherosclerosis. No signs of abdominopelvic adenopathy. Reproductive: Prostate is unremarkable. Other: Previous bilateral inguinal herniorrhaphy. There is no signs of free fluid or fluid collections. No pneumoperitoneum. Postsurgical changes identified along the midline of the ventral abdominal wall. Musculoskeletal: Degenerative disc disease is noted at L5-S1. Mild first degree anterolisthesis of L5 on S1 noted. No acute or suspicious osseous findings. IMPRESSION: 1. No acute  findings identified within the abdomen or pelvis. 2. Signs of recent small bowel resection with enteroenteric anastomosis within the right upper quadrant of the abdomen. No signs of anastomotic dehiscence. Mild surrounding soft tissue stranding is favored to represent residual postoperative inflammation. 3. Interval resolution of small pleural effusions. 4.  Aortic Atherosclerosis (ICD10-I70.0). Electronically Signed   By: Signa Kell M.D.   On: 01/24/2023 12:52   DG Chest Port 1 View  Result Date: 01/24/2023 CLINICAL DATA:  Syncope. EXAM: PORTABLE CHEST 1 VIEW COMPARISON:  12/21/2022 FINDINGS: The lungs are clear without focal pneumonia, edema, pneumothorax or pleural effusion. Streaky opacity in the left base suggest atelectasis or scarring. The cardiopericardial silhouette is within normal limits for size.  Left-sided permanent pacemaker noted. Telemetry leads overlie the chest. IMPRESSION: Streaky left basilar opacity suggests atelectasis or scarring. Otherwise no acute cardiopulmonary findings. Electronically Signed   By: Kennith Center M.D.   On: 01/24/2023 12:25     The results of significant diagnostics from this hospitalization (including imaging, microbiology, ancillary and laboratory) are listed below for reference.     Microbiology: No results found for this or any previous visit (from the past 240 hour(s)).   Labs: BNP (last 3 results) Recent Labs    12/20/22 2300  BNP 336.7*   Basic Metabolic Panel: Recent Labs  Lab 01/24/23 1114 01/24/23 1126 01/24/23 1352 01/25/23 0252 01/26/23 0704 01/28/23 0737  NA 140 140  --  141 141 138  K 3.7 3.7  --  3.7 3.7 3.5  CL 109 108  --  106 110 106  CO2 21*  --   --  22 23 23   GLUCOSE 94 95  --  96 89 93  BUN 16 18  --  13 11 8   CREATININE 1.33* 1.20  --  1.18 1.11 0.97  CALCIUM 9.0  --   --  8.5* 8.3* 8.5*  MG  --   --  1.9  --   --   --    Liver Function Tests: Recent Labs  Lab 01/24/23 1114 01/25/23 0252  AST 29 18  ALT 22 19  ALKPHOS 88 75  BILITOT 1.2 0.8  PROT 5.5* 4.5*  ALBUMIN 3.4* 2.8*   No results for input(s): "LIPASE", "AMYLASE" in the last 168 hours. No results for input(s): "AMMONIA" in the last 168 hours. CBC: Recent Labs  Lab 01/24/23 1114 01/24/23 1126 01/25/23 0252 01/26/23 0704 01/28/23 0737  WBC 4.8  --  3.9* 3.0* 2.5*  NEUTROABS 2.3  --   --   --   --   HGB 12.4* 12.2* 10.4* 9.5* 10.7*  HCT 39.4 36.0* 32.2* 29.7* 31.6*  MCV 102.6*  --  96.1 97.7 95.5  PLT 150  --  140* 123* 119*   Cardiac Enzymes: No results for input(s): "CKTOTAL", "CKMB", "CKMBINDEX", "TROPONINI" in the last 168 hours. BNP: Invalid input(s): "POCBNP" CBG: No results for input(s): "GLUCAP" in the last 168 hours. D-Dimer No results for input(s): "DDIMER" in the last 72 hours. Hgb A1c No results for input(s):  "HGBA1C" in the last 72 hours. Lipid Profile No results for input(s): "CHOL", "HDL", "LDLCALC", "TRIG", "CHOLHDL", "LDLDIRECT" in the last 72 hours. Thyroid function studies No results for input(s): "TSH", "T4TOTAL", "T3FREE", "THYROIDAB" in the last 72 hours.  Invalid input(s): "FREET3" Anemia work up No results for input(s): "VITAMINB12", "FOLATE", "FERRITIN", "TIBC", "IRON", "RETICCTPCT" in the last 72 hours. Urinalysis    Component Value Date/Time   COLORURINE YELLOW 12/15/2022 0030  APPEARANCEUR CLEAR 12/15/2022 0030   LABSPEC >1.046 (H) 12/15/2022 0030   PHURINE 8.5 (H) 12/15/2022 0030   GLUCOSEU NEGATIVE 12/15/2022 0030   HGBUR NEGATIVE 12/15/2022 0030   BILIRUBINUR NEGATIVE 12/15/2022 0030   KETONESUR NEGATIVE 12/15/2022 0030   PROTEINUR TRACE (A) 12/15/2022 0030   NITRITE NEGATIVE 12/15/2022 0030   LEUKOCYTESUR NEGATIVE 12/15/2022 0030   Sepsis Labs Recent Labs  Lab 01/24/23 1114 01/25/23 0252 01/26/23 0704 01/28/23 0737  WBC 4.8 3.9* 3.0* 2.5*   Microbiology No results found for this or any previous visit (from the past 240 hour(s)).   Time coordinating discharge:  I have spent 35 minutes face to face with the patient and on the ward discussing the patients care, assessment, plan and disposition with other care givers. >50% of the time was devoted counseling the patient about the risks and benefits of treatment/Discharge disposition and coordinating care.   SIGNED:   Dimple Nanas, MD  [facility] 01/30/2023, 8:17 AM   If 7PM-7AM, please contact night-coverage

## 2023-01-30 NOTE — TOC Transition Note (Signed)
Transition of Care [facility]) - CM/SW Discharge Note   Patient Details  Name: Jonathon Parker, Jonathon Parker MRN: [id number] Date of Birth: [birth year]  Transition of Care [name] CM/SW Contact:  Eduard Roux, LCSW Phone Number: 01/30/2023, 1:07 PM   Clinical Narrative:     SNF confirmed insurance has been approved for SNF-  Patient will Discharge to: [facility]  Discharge Date: 01/30/2023 Family Notified: spouse  Transport By: Sharin Mons  Per MD patient is ready for discharge. RN, patient, and facility notified of discharge. Discharge Summary sent to facility. RN given number for [phone], Room 608. Ambulance transport requested for patient.   Clinical Social Worker signing off.  Antony Blackbird, MSW, LCSW Clinical Social Worker     Final next level of care: Skilled Nursing Facility Barriers to Discharge: Barriers Resolved   Patient Goals and CMS Choice CMS Medicare.gov Compare Post Acute Care list provided to:: Patient Choice offered to / list presented to : Patient  Discharge Placement                Patient chooses bed at: [facility] [place] Patient to be transferred to facility by: [facility] Name of family member notified: spouse Patient and family notified of of transfer: 01/30/23  Discharge Plan and Services Additional resources added to the After Visit Summary for   In-house Referral: Clinical Social Work   Post Acute Care Choice: Skilled Nursing Facility                               Social Determinants of Health (SDOH) Interventions SDOH Screenings   Food Insecurity: No Food Insecurity (01/25/2023)  Housing: Low Risk  (01/25/2023)  Transportation Needs: No Transportation Needs (01/25/2023)  Utilities: Not At Risk (01/25/2023)  Tobacco Use: Low Risk  (01/24/2023)     Readmission Risk Interventions     No data to display

## 2023-01-30 NOTE — Plan of Care (Signed)
  Problem: Education: Goal: Knowledge of General Education information will improve Description: Including pain rating scale, medication(s)/side effects and non-pharmacologic comfort measures 01/30/2023 0929 by Herma Carson, RN Outcome: Adequate for Discharge 01/30/2023 0818 by Herma Carson, RN Outcome: Progressing   Problem: Health Behavior/Discharge Planning: Goal: Ability to manage health-related needs will improve 01/30/2023 0929 by Herma Carson, RN Outcome: Adequate for Discharge 01/30/2023 0818 by Herma Carson, RN Outcome: Progressing   Problem: Clinical Measurements: Goal: Ability to maintain clinical measurements within normal limits will improve 01/30/2023 0929 by Herma Carson, RN Outcome: Adequate for Discharge 01/30/2023 0818 by Herma Carson, RN Outcome: Progressing Goal: Will remain free from infection 01/30/2023 0929 by Herma Carson, RN Outcome: Adequate for Discharge 01/30/2023 0818 by Herma Carson, RN Outcome: Progressing Goal: Diagnostic test results will improve 01/30/2023 0929 by Herma Carson, RN Outcome: Adequate for Discharge 01/30/2023 0818 by Herma Carson, RN Outcome: Progressing Goal: Respiratory complications will improve 01/30/2023 0929 by Herma Carson, RN Outcome: Adequate for Discharge 01/30/2023 0818 by Herma Carson, RN Outcome: Progressing Goal: Cardiovascular complication will be avoided 01/30/2023 0929 by Herma Carson, RN Outcome: Adequate for Discharge 01/30/2023 0818 by Herma Carson, RN Outcome: Progressing   Problem: Activity: Goal: Risk for activity intolerance will decrease 01/30/2023 0929 by Herma Carson, RN Outcome: Adequate for Discharge 01/30/2023 0818 by Herma Carson, RN Outcome: Progressing   Problem: Nutrition: Goal: Adequate nutrition will be maintained 01/30/2023 0929 by Herma Carson, RN Outcome: Adequate for Discharge 01/30/2023 0818 by Herma Carson, RN Outcome: Progressing    Problem: Coping: Goal: Level of anxiety will decrease 01/30/2023 0929 by Herma Carson, RN Outcome: Adequate for Discharge 01/30/2023 0818 by Herma Carson, RN Outcome: Progressing   Problem: Elimination: Goal: Will not experience complications related to bowel motility 01/30/2023 0929 by Herma Carson, RN Outcome: Adequate for Discharge 01/30/2023 0818 by Herma Carson, RN Outcome: Progressing Goal: Will not experience complications related to urinary retention 01/30/2023 0929 by Herma Carson, RN Outcome: Adequate for Discharge 01/30/2023 0818 by Herma Carson, RN Outcome: Progressing   Problem: Pain Managment: Goal: General experience of comfort will improve 01/30/2023 0929 by Herma Carson, RN Outcome: Adequate for Discharge 01/30/2023 0818 by Herma Carson, RN Outcome: Progressing   Problem: Safety: Goal: Ability to remain free from injury will improve 01/30/2023 0929 by Herma Carson, RN Outcome: Adequate for Discharge 01/30/2023 0818 by Herma Carson, RN Outcome: Progressing   Problem: Skin Integrity: Goal: Risk for impaired skin integrity will decrease 01/30/2023 0929 by Herma Carson, RN Outcome: Adequate for Discharge 01/30/2023 0818 by Herma Carson, RN Outcome: Progressing   Problem: Acute Rehab PT Goals(only PT should resolve) Goal: Pt Will Go Supine/Side To Sit Outcome: Adequate for Discharge Goal: Pt Will Go Sit To Supine/Side Outcome: Adequate for Discharge Goal: Patient Will Transfer Sit To/From Stand Outcome: Adequate for Discharge Goal: Pt Will Transfer Bed To Chair/Chair To Bed Outcome: Adequate for Discharge Goal: Pt Will Ambulate Outcome: Adequate for Discharge   Problem: Acute Rehab OT Goals (only OT should resolve) Goal: Pt. Will Perform Grooming Outcome: Adequate for Discharge Goal: Pt. Will Perform Lower Body Dressing Outcome: Adequate for Discharge Goal: Pt. Will Transfer To Toilet Outcome: Adequate for  Discharge Goal: OT Additional ADL Goal #1 Outcome: Adequate for Discharge

## 2023-01-31 DIAGNOSIS — I4891 Unspecified atrial fibrillation: Secondary | ICD-10-CM | POA: Diagnosis not present

## 2023-01-31 DIAGNOSIS — R262 Difficulty in walking, not elsewhere classified: Secondary | ICD-10-CM | POA: Diagnosis not present

## 2023-01-31 DIAGNOSIS — I119 Hypertensive heart disease without heart failure: Secondary | ICD-10-CM | POA: Diagnosis not present

## 2023-01-31 DIAGNOSIS — F0390 Unspecified dementia without behavioral disturbance: Secondary | ICD-10-CM | POA: Diagnosis not present

## 2023-02-13 DIAGNOSIS — F5105 Insomnia due to other mental disorder: Secondary | ICD-10-CM | POA: Diagnosis not present

## 2023-02-13 DIAGNOSIS — F4321 Adjustment disorder with depressed mood: Secondary | ICD-10-CM | POA: Diagnosis not present

## 2023-02-13 DIAGNOSIS — F03B0 Unspecified dementia, moderate, without behavioral disturbance, psychotic disturbance, mood disturbance, and anxiety: Secondary | ICD-10-CM | POA: Diagnosis not present

## 2023-02-16 DIAGNOSIS — J45909 Unspecified asthma, uncomplicated: Secondary | ICD-10-CM | POA: Diagnosis not present

## 2023-02-17 DIAGNOSIS — I480 Paroxysmal atrial fibrillation: Secondary | ICD-10-CM | POA: Diagnosis not present

## 2023-02-17 DIAGNOSIS — F5104 Psychophysiologic insomnia: Secondary | ICD-10-CM | POA: Diagnosis not present

## 2023-02-17 DIAGNOSIS — Z48815 Encounter for surgical aftercare following surgery on the digestive system: Secondary | ICD-10-CM | POA: Diagnosis not present

## 2023-02-17 DIAGNOSIS — G4733 Obstructive sleep apnea (adult) (pediatric): Secondary | ICD-10-CM | POA: Diagnosis not present

## 2023-02-17 DIAGNOSIS — G309 Alzheimer's disease, unspecified: Secondary | ICD-10-CM | POA: Diagnosis not present

## 2023-02-17 DIAGNOSIS — F331 Major depressive disorder, recurrent, moderate: Secondary | ICD-10-CM | POA: Diagnosis not present

## 2023-02-17 DIAGNOSIS — F02B4 Dementia in other diseases classified elsewhere, moderate, with anxiety: Secondary | ICD-10-CM | POA: Diagnosis not present

## 2023-02-17 DIAGNOSIS — F02B3 Dementia in other diseases classified elsewhere, moderate, with mood disturbance: Secondary | ICD-10-CM | POA: Diagnosis not present

## 2023-02-17 DIAGNOSIS — F02B18 Dementia in other diseases classified elsewhere, moderate, with other behavioral disturbance: Secondary | ICD-10-CM | POA: Diagnosis not present

## 2023-02-19 DIAGNOSIS — Z48815 Encounter for surgical aftercare following surgery on the digestive system: Secondary | ICD-10-CM | POA: Diagnosis not present

## 2023-02-19 DIAGNOSIS — F02B3 Dementia in other diseases classified elsewhere, moderate, with mood disturbance: Secondary | ICD-10-CM | POA: Diagnosis not present

## 2023-02-19 DIAGNOSIS — F02B18 Dementia in other diseases classified elsewhere, moderate, with other behavioral disturbance: Secondary | ICD-10-CM | POA: Diagnosis not present

## 2023-02-19 DIAGNOSIS — G4733 Obstructive sleep apnea (adult) (pediatric): Secondary | ICD-10-CM | POA: Diagnosis not present

## 2023-02-19 DIAGNOSIS — I480 Paroxysmal atrial fibrillation: Secondary | ICD-10-CM | POA: Diagnosis not present

## 2023-02-19 DIAGNOSIS — F02B4 Dementia in other diseases classified elsewhere, moderate, with anxiety: Secondary | ICD-10-CM | POA: Diagnosis not present

## 2023-02-19 DIAGNOSIS — G309 Alzheimer's disease, unspecified: Secondary | ICD-10-CM | POA: Diagnosis not present

## 2023-02-19 DIAGNOSIS — F331 Major depressive disorder, recurrent, moderate: Secondary | ICD-10-CM | POA: Diagnosis not present

## 2023-02-19 DIAGNOSIS — F5104 Psychophysiologic insomnia: Secondary | ICD-10-CM | POA: Diagnosis not present

## 2023-02-20 DIAGNOSIS — I495 Sick sinus syndrome: Secondary | ICD-10-CM | POA: Diagnosis not present

## 2023-02-20 DIAGNOSIS — R55 Syncope and collapse: Secondary | ICD-10-CM | POA: Diagnosis not present

## 2023-02-20 DIAGNOSIS — Z9221 Personal history of antineoplastic chemotherapy: Secondary | ICD-10-CM | POA: Diagnosis not present

## 2023-02-20 DIAGNOSIS — I480 Paroxysmal atrial fibrillation: Secondary | ICD-10-CM | POA: Diagnosis not present

## 2023-02-20 DIAGNOSIS — I10 Essential (primary) hypertension: Secondary | ICD-10-CM | POA: Diagnosis not present

## 2023-02-20 DIAGNOSIS — C9110 Chronic lymphocytic leukemia of B-cell type not having achieved remission: Secondary | ICD-10-CM | POA: Diagnosis not present

## 2023-02-20 DIAGNOSIS — Z7901 Long term (current) use of anticoagulants: Secondary | ICD-10-CM | POA: Diagnosis not present

## 2023-02-20 DIAGNOSIS — Z950 Presence of cardiac pacemaker: Secondary | ICD-10-CM | POA: Diagnosis not present

## 2023-02-21 DIAGNOSIS — I480 Paroxysmal atrial fibrillation: Secondary | ICD-10-CM | POA: Diagnosis not present

## 2023-02-21 DIAGNOSIS — F02B18 Dementia in other diseases classified elsewhere, moderate, with other behavioral disturbance: Secondary | ICD-10-CM | POA: Diagnosis not present

## 2023-02-21 DIAGNOSIS — Z48815 Encounter for surgical aftercare following surgery on the digestive system: Secondary | ICD-10-CM | POA: Diagnosis not present

## 2023-02-21 DIAGNOSIS — F331 Major depressive disorder, recurrent, moderate: Secondary | ICD-10-CM | POA: Diagnosis not present

## 2023-02-21 DIAGNOSIS — F5104 Psychophysiologic insomnia: Secondary | ICD-10-CM | POA: Diagnosis not present

## 2023-02-21 DIAGNOSIS — F02B3 Dementia in other diseases classified elsewhere, moderate, with mood disturbance: Secondary | ICD-10-CM | POA: Diagnosis not present

## 2023-02-21 DIAGNOSIS — G309 Alzheimer's disease, unspecified: Secondary | ICD-10-CM | POA: Diagnosis not present

## 2023-02-21 DIAGNOSIS — G4733 Obstructive sleep apnea (adult) (pediatric): Secondary | ICD-10-CM | POA: Diagnosis not present

## 2023-02-21 DIAGNOSIS — F02B4 Dementia in other diseases classified elsewhere, moderate, with anxiety: Secondary | ICD-10-CM | POA: Diagnosis not present

## 2023-02-24 DIAGNOSIS — F5104 Psychophysiologic insomnia: Secondary | ICD-10-CM | POA: Diagnosis not present

## 2023-02-24 DIAGNOSIS — G4733 Obstructive sleep apnea (adult) (pediatric): Secondary | ICD-10-CM | POA: Diagnosis not present

## 2023-02-24 DIAGNOSIS — F02B4 Dementia in other diseases classified elsewhere, moderate, with anxiety: Secondary | ICD-10-CM | POA: Diagnosis not present

## 2023-02-24 DIAGNOSIS — F02B3 Dementia in other diseases classified elsewhere, moderate, with mood disturbance: Secondary | ICD-10-CM | POA: Diagnosis not present

## 2023-02-24 DIAGNOSIS — F02B18 Dementia in other diseases classified elsewhere, moderate, with other behavioral disturbance: Secondary | ICD-10-CM | POA: Diagnosis not present

## 2023-02-24 DIAGNOSIS — F331 Major depressive disorder, recurrent, moderate: Secondary | ICD-10-CM | POA: Diagnosis not present

## 2023-02-24 DIAGNOSIS — Z48815 Encounter for surgical aftercare following surgery on the digestive system: Secondary | ICD-10-CM | POA: Diagnosis not present

## 2023-02-24 DIAGNOSIS — I480 Paroxysmal atrial fibrillation: Secondary | ICD-10-CM | POA: Diagnosis not present

## 2023-02-24 DIAGNOSIS — G309 Alzheimer's disease, unspecified: Secondary | ICD-10-CM | POA: Diagnosis not present

## 2023-02-26 DIAGNOSIS — F331 Major depressive disorder, recurrent, moderate: Secondary | ICD-10-CM | POA: Diagnosis not present

## 2023-02-26 DIAGNOSIS — F02B18 Dementia in other diseases classified elsewhere, moderate, with other behavioral disturbance: Secondary | ICD-10-CM | POA: Diagnosis not present

## 2023-02-26 DIAGNOSIS — F5104 Psychophysiologic insomnia: Secondary | ICD-10-CM | POA: Diagnosis not present

## 2023-02-26 DIAGNOSIS — I480 Paroxysmal atrial fibrillation: Secondary | ICD-10-CM | POA: Diagnosis not present

## 2023-02-26 DIAGNOSIS — G4733 Obstructive sleep apnea (adult) (pediatric): Secondary | ICD-10-CM | POA: Diagnosis not present

## 2023-02-26 DIAGNOSIS — F02B3 Dementia in other diseases classified elsewhere, moderate, with mood disturbance: Secondary | ICD-10-CM | POA: Diagnosis not present

## 2023-02-26 DIAGNOSIS — Z48815 Encounter for surgical aftercare following surgery on the digestive system: Secondary | ICD-10-CM | POA: Diagnosis not present

## 2023-02-26 DIAGNOSIS — G309 Alzheimer's disease, unspecified: Secondary | ICD-10-CM | POA: Diagnosis not present

## 2023-02-26 DIAGNOSIS — F02B4 Dementia in other diseases classified elsewhere, moderate, with anxiety: Secondary | ICD-10-CM | POA: Diagnosis not present

## 2023-02-28 DIAGNOSIS — F02B18 Dementia in other diseases classified elsewhere, moderate, with other behavioral disturbance: Secondary | ICD-10-CM | POA: Diagnosis not present

## 2023-02-28 DIAGNOSIS — G309 Alzheimer's disease, unspecified: Secondary | ICD-10-CM | POA: Diagnosis not present

## 2023-02-28 DIAGNOSIS — Z48815 Encounter for surgical aftercare following surgery on the digestive system: Secondary | ICD-10-CM | POA: Diagnosis not present

## 2023-02-28 DIAGNOSIS — F02B3 Dementia in other diseases classified elsewhere, moderate, with mood disturbance: Secondary | ICD-10-CM | POA: Diagnosis not present

## 2023-02-28 DIAGNOSIS — F5104 Psychophysiologic insomnia: Secondary | ICD-10-CM | POA: Diagnosis not present

## 2023-02-28 DIAGNOSIS — I480 Paroxysmal atrial fibrillation: Secondary | ICD-10-CM | POA: Diagnosis not present

## 2023-02-28 DIAGNOSIS — G4733 Obstructive sleep apnea (adult) (pediatric): Secondary | ICD-10-CM | POA: Diagnosis not present

## 2023-02-28 DIAGNOSIS — F331 Major depressive disorder, recurrent, moderate: Secondary | ICD-10-CM | POA: Diagnosis not present

## 2023-02-28 DIAGNOSIS — F02B4 Dementia in other diseases classified elsewhere, moderate, with anxiety: Secondary | ICD-10-CM | POA: Diagnosis not present

## 2023-03-03 DIAGNOSIS — F02B18 Dementia in other diseases classified elsewhere, moderate, with other behavioral disturbance: Secondary | ICD-10-CM | POA: Diagnosis not present

## 2023-03-03 DIAGNOSIS — I480 Paroxysmal atrial fibrillation: Secondary | ICD-10-CM | POA: Diagnosis not present

## 2023-03-03 DIAGNOSIS — F02B3 Dementia in other diseases classified elsewhere, moderate, with mood disturbance: Secondary | ICD-10-CM | POA: Diagnosis not present

## 2023-03-03 DIAGNOSIS — F02B4 Dementia in other diseases classified elsewhere, moderate, with anxiety: Secondary | ICD-10-CM | POA: Diagnosis not present

## 2023-03-03 DIAGNOSIS — F5104 Psychophysiologic insomnia: Secondary | ICD-10-CM | POA: Diagnosis not present

## 2023-03-03 DIAGNOSIS — Z48815 Encounter for surgical aftercare following surgery on the digestive system: Secondary | ICD-10-CM | POA: Diagnosis not present

## 2023-03-03 DIAGNOSIS — F331 Major depressive disorder, recurrent, moderate: Secondary | ICD-10-CM | POA: Diagnosis not present

## 2023-03-03 DIAGNOSIS — G4733 Obstructive sleep apnea (adult) (pediatric): Secondary | ICD-10-CM | POA: Diagnosis not present

## 2023-03-03 DIAGNOSIS — G309 Alzheimer's disease, unspecified: Secondary | ICD-10-CM | POA: Diagnosis not present

## 2023-03-05 DIAGNOSIS — F5104 Psychophysiologic insomnia: Secondary | ICD-10-CM | POA: Diagnosis not present

## 2023-03-05 DIAGNOSIS — F02B18 Dementia in other diseases classified elsewhere, moderate, with other behavioral disturbance: Secondary | ICD-10-CM | POA: Diagnosis not present

## 2023-03-05 DIAGNOSIS — F02B4 Dementia in other diseases classified elsewhere, moderate, with anxiety: Secondary | ICD-10-CM | POA: Diagnosis not present

## 2023-03-05 DIAGNOSIS — Z48815 Encounter for surgical aftercare following surgery on the digestive system: Secondary | ICD-10-CM | POA: Diagnosis not present

## 2023-03-05 DIAGNOSIS — G4733 Obstructive sleep apnea (adult) (pediatric): Secondary | ICD-10-CM | POA: Diagnosis not present

## 2023-03-05 DIAGNOSIS — F02B3 Dementia in other diseases classified elsewhere, moderate, with mood disturbance: Secondary | ICD-10-CM | POA: Diagnosis not present

## 2023-03-05 DIAGNOSIS — G309 Alzheimer's disease, unspecified: Secondary | ICD-10-CM | POA: Diagnosis not present

## 2023-03-05 DIAGNOSIS — I480 Paroxysmal atrial fibrillation: Secondary | ICD-10-CM | POA: Diagnosis not present

## 2023-03-05 DIAGNOSIS — F331 Major depressive disorder, recurrent, moderate: Secondary | ICD-10-CM | POA: Diagnosis not present

## 2023-03-09 DIAGNOSIS — F5104 Psychophysiologic insomnia: Secondary | ICD-10-CM | POA: Diagnosis not present

## 2023-03-09 DIAGNOSIS — I480 Paroxysmal atrial fibrillation: Secondary | ICD-10-CM | POA: Diagnosis not present

## 2023-03-09 DIAGNOSIS — G309 Alzheimer's disease, unspecified: Secondary | ICD-10-CM | POA: Diagnosis not present

## 2023-03-09 DIAGNOSIS — F331 Major depressive disorder, recurrent, moderate: Secondary | ICD-10-CM | POA: Diagnosis not present

## 2023-03-09 DIAGNOSIS — F02B4 Dementia in other diseases classified elsewhere, moderate, with anxiety: Secondary | ICD-10-CM | POA: Diagnosis not present

## 2023-03-09 DIAGNOSIS — Z48815 Encounter for surgical aftercare following surgery on the digestive system: Secondary | ICD-10-CM | POA: Diagnosis not present

## 2023-03-09 DIAGNOSIS — F02B18 Dementia in other diseases classified elsewhere, moderate, with other behavioral disturbance: Secondary | ICD-10-CM | POA: Diagnosis not present

## 2023-03-09 DIAGNOSIS — G4733 Obstructive sleep apnea (adult) (pediatric): Secondary | ICD-10-CM | POA: Diagnosis not present

## 2023-03-09 DIAGNOSIS — F02B3 Dementia in other diseases classified elsewhere, moderate, with mood disturbance: Secondary | ICD-10-CM | POA: Diagnosis not present

## 2023-03-10 DIAGNOSIS — F02B3 Dementia in other diseases classified elsewhere, moderate, with mood disturbance: Secondary | ICD-10-CM | POA: Diagnosis not present

## 2023-03-10 DIAGNOSIS — F02B4 Dementia in other diseases classified elsewhere, moderate, with anxiety: Secondary | ICD-10-CM | POA: Diagnosis not present

## 2023-03-10 DIAGNOSIS — F331 Major depressive disorder, recurrent, moderate: Secondary | ICD-10-CM | POA: Diagnosis not present

## 2023-03-10 DIAGNOSIS — Z48815 Encounter for surgical aftercare following surgery on the digestive system: Secondary | ICD-10-CM | POA: Diagnosis not present

## 2023-03-10 DIAGNOSIS — F5104 Psychophysiologic insomnia: Secondary | ICD-10-CM | POA: Diagnosis not present

## 2023-03-10 DIAGNOSIS — I480 Paroxysmal atrial fibrillation: Secondary | ICD-10-CM | POA: Diagnosis not present

## 2023-03-10 DIAGNOSIS — G309 Alzheimer's disease, unspecified: Secondary | ICD-10-CM | POA: Diagnosis not present

## 2023-03-10 DIAGNOSIS — G4733 Obstructive sleep apnea (adult) (pediatric): Secondary | ICD-10-CM | POA: Diagnosis not present

## 2023-03-10 DIAGNOSIS — F02B18 Dementia in other diseases classified elsewhere, moderate, with other behavioral disturbance: Secondary | ICD-10-CM | POA: Diagnosis not present

## 2023-03-11 ENCOUNTER — Ambulatory Visit: Payer: Medicare PPO | Admitting: Podiatry

## 2023-03-14 DIAGNOSIS — G4733 Obstructive sleep apnea (adult) (pediatric): Secondary | ICD-10-CM | POA: Diagnosis not present

## 2023-03-14 DIAGNOSIS — G309 Alzheimer's disease, unspecified: Secondary | ICD-10-CM | POA: Diagnosis not present

## 2023-03-14 DIAGNOSIS — F331 Major depressive disorder, recurrent, moderate: Secondary | ICD-10-CM | POA: Diagnosis not present

## 2023-03-14 DIAGNOSIS — I480 Paroxysmal atrial fibrillation: Secondary | ICD-10-CM | POA: Diagnosis not present

## 2023-03-14 DIAGNOSIS — F5104 Psychophysiologic insomnia: Secondary | ICD-10-CM | POA: Diagnosis not present

## 2023-03-14 DIAGNOSIS — F02B4 Dementia in other diseases classified elsewhere, moderate, with anxiety: Secondary | ICD-10-CM | POA: Diagnosis not present

## 2023-03-14 DIAGNOSIS — F02B18 Dementia in other diseases classified elsewhere, moderate, with other behavioral disturbance: Secondary | ICD-10-CM | POA: Diagnosis not present

## 2023-03-14 DIAGNOSIS — Z48815 Encounter for surgical aftercare following surgery on the digestive system: Secondary | ICD-10-CM | POA: Diagnosis not present

## 2023-03-14 DIAGNOSIS — F02B3 Dementia in other diseases classified elsewhere, moderate, with mood disturbance: Secondary | ICD-10-CM | POA: Diagnosis not present

## 2023-03-17 DIAGNOSIS — E559 Vitamin D deficiency, unspecified: Secondary | ICD-10-CM | POA: Diagnosis not present

## 2023-03-17 DIAGNOSIS — I10 Essential (primary) hypertension: Secondary | ICD-10-CM | POA: Diagnosis not present

## 2023-03-17 DIAGNOSIS — Z48815 Encounter for surgical aftercare following surgery on the digestive system: Secondary | ICD-10-CM | POA: Diagnosis not present

## 2023-03-17 DIAGNOSIS — F02B18 Dementia in other diseases classified elsewhere, moderate, with other behavioral disturbance: Secondary | ICD-10-CM | POA: Diagnosis not present

## 2023-03-17 DIAGNOSIS — G4733 Obstructive sleep apnea (adult) (pediatric): Secondary | ICD-10-CM | POA: Diagnosis not present

## 2023-03-17 DIAGNOSIS — F02B4 Dementia in other diseases classified elsewhere, moderate, with anxiety: Secondary | ICD-10-CM | POA: Diagnosis not present

## 2023-03-17 DIAGNOSIS — G309 Alzheimer's disease, unspecified: Secondary | ICD-10-CM | POA: Diagnosis not present

## 2023-03-17 DIAGNOSIS — K219 Gastro-esophageal reflux disease without esophagitis: Secondary | ICD-10-CM | POA: Diagnosis not present

## 2023-03-17 DIAGNOSIS — F331 Major depressive disorder, recurrent, moderate: Secondary | ICD-10-CM | POA: Diagnosis not present

## 2023-03-17 DIAGNOSIS — I480 Paroxysmal atrial fibrillation: Secondary | ICD-10-CM | POA: Diagnosis not present

## 2023-03-17 DIAGNOSIS — C9110 Chronic lymphocytic leukemia of B-cell type not having achieved remission: Secondary | ICD-10-CM | POA: Diagnosis not present

## 2023-03-17 DIAGNOSIS — Z1211 Encounter for screening for malignant neoplasm of colon: Secondary | ICD-10-CM | POA: Diagnosis not present

## 2023-03-17 DIAGNOSIS — E785 Hyperlipidemia, unspecified: Secondary | ICD-10-CM | POA: Diagnosis not present

## 2023-03-17 DIAGNOSIS — F5104 Psychophysiologic insomnia: Secondary | ICD-10-CM | POA: Diagnosis not present

## 2023-03-17 DIAGNOSIS — F02B3 Dementia in other diseases classified elsewhere, moderate, with mood disturbance: Secondary | ICD-10-CM | POA: Diagnosis not present

## 2023-03-19 DIAGNOSIS — F5104 Psychophysiologic insomnia: Secondary | ICD-10-CM | POA: Diagnosis not present

## 2023-03-19 DIAGNOSIS — J45909 Unspecified asthma, uncomplicated: Secondary | ICD-10-CM | POA: Diagnosis not present

## 2023-03-19 DIAGNOSIS — I480 Paroxysmal atrial fibrillation: Secondary | ICD-10-CM | POA: Diagnosis not present

## 2023-03-19 DIAGNOSIS — G4733 Obstructive sleep apnea (adult) (pediatric): Secondary | ICD-10-CM | POA: Diagnosis not present

## 2023-03-19 DIAGNOSIS — F331 Major depressive disorder, recurrent, moderate: Secondary | ICD-10-CM | POA: Diagnosis not present

## 2023-03-19 DIAGNOSIS — F02B4 Dementia in other diseases classified elsewhere, moderate, with anxiety: Secondary | ICD-10-CM | POA: Diagnosis not present

## 2023-03-19 DIAGNOSIS — F02B18 Dementia in other diseases classified elsewhere, moderate, with other behavioral disturbance: Secondary | ICD-10-CM | POA: Diagnosis not present

## 2023-03-19 DIAGNOSIS — F02B3 Dementia in other diseases classified elsewhere, moderate, with mood disturbance: Secondary | ICD-10-CM | POA: Diagnosis not present

## 2023-03-19 DIAGNOSIS — G309 Alzheimer's disease, unspecified: Secondary | ICD-10-CM | POA: Diagnosis not present

## 2023-03-19 DIAGNOSIS — Z48815 Encounter for surgical aftercare following surgery on the digestive system: Secondary | ICD-10-CM | POA: Diagnosis not present

## 2023-03-20 DIAGNOSIS — F331 Major depressive disorder, recurrent, moderate: Secondary | ICD-10-CM | POA: Diagnosis not present

## 2023-03-20 DIAGNOSIS — G309 Alzheimer's disease, unspecified: Secondary | ICD-10-CM | POA: Diagnosis not present

## 2023-03-20 DIAGNOSIS — Z48815 Encounter for surgical aftercare following surgery on the digestive system: Secondary | ICD-10-CM | POA: Diagnosis not present

## 2023-03-20 DIAGNOSIS — F02B18 Dementia in other diseases classified elsewhere, moderate, with other behavioral disturbance: Secondary | ICD-10-CM | POA: Diagnosis not present

## 2023-03-20 DIAGNOSIS — G4733 Obstructive sleep apnea (adult) (pediatric): Secondary | ICD-10-CM | POA: Diagnosis not present

## 2023-03-20 DIAGNOSIS — I480 Paroxysmal atrial fibrillation: Secondary | ICD-10-CM | POA: Diagnosis not present

## 2023-03-20 DIAGNOSIS — F02B4 Dementia in other diseases classified elsewhere, moderate, with anxiety: Secondary | ICD-10-CM | POA: Diagnosis not present

## 2023-03-20 DIAGNOSIS — F5104 Psychophysiologic insomnia: Secondary | ICD-10-CM | POA: Diagnosis not present

## 2023-03-20 DIAGNOSIS — F02B3 Dementia in other diseases classified elsewhere, moderate, with mood disturbance: Secondary | ICD-10-CM | POA: Diagnosis not present

## 2023-03-24 DIAGNOSIS — F02B4 Dementia in other diseases classified elsewhere, moderate, with anxiety: Secondary | ICD-10-CM | POA: Diagnosis not present

## 2023-03-24 DIAGNOSIS — F02B18 Dementia in other diseases classified elsewhere, moderate, with other behavioral disturbance: Secondary | ICD-10-CM | POA: Diagnosis not present

## 2023-03-24 DIAGNOSIS — F02B3 Dementia in other diseases classified elsewhere, moderate, with mood disturbance: Secondary | ICD-10-CM | POA: Diagnosis not present

## 2023-03-24 DIAGNOSIS — G309 Alzheimer's disease, unspecified: Secondary | ICD-10-CM | POA: Diagnosis not present

## 2023-03-24 DIAGNOSIS — Z48815 Encounter for surgical aftercare following surgery on the digestive system: Secondary | ICD-10-CM | POA: Diagnosis not present

## 2023-03-24 DIAGNOSIS — F5104 Psychophysiologic insomnia: Secondary | ICD-10-CM | POA: Diagnosis not present

## 2023-03-24 DIAGNOSIS — G4733 Obstructive sleep apnea (adult) (pediatric): Secondary | ICD-10-CM | POA: Diagnosis not present

## 2023-03-24 DIAGNOSIS — F331 Major depressive disorder, recurrent, moderate: Secondary | ICD-10-CM | POA: Diagnosis not present

## 2023-03-24 DIAGNOSIS — I480 Paroxysmal atrial fibrillation: Secondary | ICD-10-CM | POA: Diagnosis not present

## 2023-03-25 ENCOUNTER — Ambulatory Visit (INDEPENDENT_AMBULATORY_CARE_PROVIDER_SITE_OTHER): Payer: Medicare PPO | Admitting: Podiatry

## 2023-03-25 DIAGNOSIS — M79674 Pain in right toe(s): Secondary | ICD-10-CM | POA: Diagnosis not present

## 2023-03-25 DIAGNOSIS — F02B4 Dementia in other diseases classified elsewhere, moderate, with anxiety: Secondary | ICD-10-CM | POA: Diagnosis not present

## 2023-03-25 DIAGNOSIS — L600 Ingrowing nail: Secondary | ICD-10-CM | POA: Diagnosis not present

## 2023-03-25 DIAGNOSIS — F02B3 Dementia in other diseases classified elsewhere, moderate, with mood disturbance: Secondary | ICD-10-CM | POA: Diagnosis not present

## 2023-03-25 DIAGNOSIS — G5792 Unspecified mononeuropathy of left lower limb: Secondary | ICD-10-CM

## 2023-03-25 DIAGNOSIS — M79675 Pain in left toe(s): Secondary | ICD-10-CM | POA: Diagnosis not present

## 2023-03-25 DIAGNOSIS — F331 Major depressive disorder, recurrent, moderate: Secondary | ICD-10-CM | POA: Diagnosis not present

## 2023-03-25 DIAGNOSIS — G309 Alzheimer's disease, unspecified: Secondary | ICD-10-CM | POA: Diagnosis not present

## 2023-03-25 DIAGNOSIS — L03031 Cellulitis of right toe: Secondary | ICD-10-CM

## 2023-03-25 DIAGNOSIS — B351 Tinea unguium: Secondary | ICD-10-CM

## 2023-03-25 DIAGNOSIS — G4733 Obstructive sleep apnea (adult) (pediatric): Secondary | ICD-10-CM | POA: Diagnosis not present

## 2023-03-25 DIAGNOSIS — Z48815 Encounter for surgical aftercare following surgery on the digestive system: Secondary | ICD-10-CM | POA: Diagnosis not present

## 2023-03-25 DIAGNOSIS — F02B18 Dementia in other diseases classified elsewhere, moderate, with other behavioral disturbance: Secondary | ICD-10-CM | POA: Diagnosis not present

## 2023-03-25 DIAGNOSIS — I480 Paroxysmal atrial fibrillation: Secondary | ICD-10-CM | POA: Diagnosis not present

## 2023-03-25 DIAGNOSIS — L601 Onycholysis: Secondary | ICD-10-CM

## 2023-03-25 DIAGNOSIS — F5104 Psychophysiologic insomnia: Secondary | ICD-10-CM | POA: Diagnosis not present

## 2023-03-25 MED ORDER — CEPHALEXIN 500 MG PO CAPS: 500.0000 mg | ORAL_CAPSULE | Freq: Three times a day (TID) | ORAL | 0 refills | Status: AC

## 2023-03-25 NOTE — Patient Instructions (Signed)

## 2023-03-25 NOTE — Progress Notes (Signed)
  Subjective:  Patient ID: Jonathon Parker, male    DOB: 1950-08-11,  MRN: [id number]  Chief Complaint  Patient presents with   Nail Problem    RFC pt also states his right great toe nail is starting to lift and is becoming painful     72 y.o. male presents with the above complaint. History confirmed with patient. Patient presenting with pain related to dystrophic thickened elongated nails. Patient is unable to trim own nails related to nail dystrophy and/or mobility issues. Patient does not have a history of T2DM. No calluses.  Patient having severe pain in right hallux nail which is lifted off nail bed.   Objective:  Physical Exam: warm, good capillary refill nail exam onychomycosis of the toenails, onycholysis, and dystrophic nails DP pulses palpable, PT pulses palpable, and protective sensation intact Left Foot:  Pain with palpation of nails due to elongation and dystrophic growth.   Right Foot: Pain with palpation of nails due to elongation and dystrophic growth.  Right hallux with significant erythema at the nail base.  There is onycholysis noted with pain about the nail with palpation.  Upon removal of the nail there is no to be purulent drainage from the medial aspect of the hallux nail base.  No deep wound or ulcer underlying the nail on the nailbed.  Assessment:   1. Ingrown nail of great toe of right foot   2. Paronychia of great toe of right foot   3. Nail plate separation   4. Pain due to onychomycosis of toenails of both feet   5. Neuropathy of left foot     Plan:  Patient was evaluated and treated and all questions answered.  # Right hallux ingrown nail and paronychia of the right hallux  -Patient elects to proceed with minor surgery to remove ingrown toenail today. Consent reviewed and signed by patient. -E Rx for cephalexin 500 mg 3 times daily for 5 days due to paronychia and purulence upon removal of the nail -Ingrown nail excised. See procedure  note. -Educated on post-procedure care including soaking. Written instructions provided and reviewed. -Patient to follow up in 2 weeks for nail check.  Procedure: Excision of Ingrown Toenail -avulsion only no phenol Location: Right 1st toe total nail  Anesthesia: Lidocaine 1% plain; 1.5 mL and Marcaine 0.5% plain; 1.5 mL, digital block. Skin Prep: Betadine. Dressing: Silvadene; telfa; dry, sterile, compression dressing. Technique: Following skin prep, the toe was exsanguinated and a tourniquet was secured at the base of the toe. The affected nail border was freed, split with a nail splitter, and excised. Chemical matrixectomy was then performed with phenol and irrigated out with alcohol. The tourniquet was then removed and sterile dressing applied. Disposition: Patient tolerated procedure well. Patient to return in 2 weeks for follow-up.    #Onychomycosis with pain  -Nails palliatively debrided as below. -Educated on self-care  Procedure: Nail Debridement Rationale: Pain Type of Debridement: manual, sharp debridement. Instrumentation: Nail nipper, rotary burr. Number of Nails: 10  Return in about 2 weeks (around 04/08/2023) for nail check.         Corinna Gab, DPM [facility] / [facility]

## 2023-03-26 DIAGNOSIS — F02B3 Dementia in other diseases classified elsewhere, moderate, with mood disturbance: Secondary | ICD-10-CM | POA: Diagnosis not present

## 2023-03-26 DIAGNOSIS — F331 Major depressive disorder, recurrent, moderate: Secondary | ICD-10-CM | POA: Diagnosis not present

## 2023-03-26 DIAGNOSIS — Z48815 Encounter for surgical aftercare following surgery on the digestive system: Secondary | ICD-10-CM | POA: Diagnosis not present

## 2023-03-26 DIAGNOSIS — G4733 Obstructive sleep apnea (adult) (pediatric): Secondary | ICD-10-CM | POA: Diagnosis not present

## 2023-03-26 DIAGNOSIS — F5104 Psychophysiologic insomnia: Secondary | ICD-10-CM | POA: Diagnosis not present

## 2023-03-26 DIAGNOSIS — I480 Paroxysmal atrial fibrillation: Secondary | ICD-10-CM | POA: Diagnosis not present

## 2023-03-26 DIAGNOSIS — F02B18 Dementia in other diseases classified elsewhere, moderate, with other behavioral disturbance: Secondary | ICD-10-CM | POA: Diagnosis not present

## 2023-03-26 DIAGNOSIS — G309 Alzheimer's disease, unspecified: Secondary | ICD-10-CM | POA: Diagnosis not present

## 2023-03-26 DIAGNOSIS — F02B4 Dementia in other diseases classified elsewhere, moderate, with anxiety: Secondary | ICD-10-CM | POA: Diagnosis not present

## 2023-03-27 DIAGNOSIS — F02B18 Dementia in other diseases classified elsewhere, moderate, with other behavioral disturbance: Secondary | ICD-10-CM | POA: Diagnosis not present

## 2023-03-27 DIAGNOSIS — Z48815 Encounter for surgical aftercare following surgery on the digestive system: Secondary | ICD-10-CM | POA: Diagnosis not present

## 2023-03-27 DIAGNOSIS — F5104 Psychophysiologic insomnia: Secondary | ICD-10-CM | POA: Diagnosis not present

## 2023-03-27 DIAGNOSIS — F02B4 Dementia in other diseases classified elsewhere, moderate, with anxiety: Secondary | ICD-10-CM | POA: Diagnosis not present

## 2023-03-27 DIAGNOSIS — G4733 Obstructive sleep apnea (adult) (pediatric): Secondary | ICD-10-CM | POA: Diagnosis not present

## 2023-03-27 DIAGNOSIS — I480 Paroxysmal atrial fibrillation: Secondary | ICD-10-CM | POA: Diagnosis not present

## 2023-03-27 DIAGNOSIS — G309 Alzheimer's disease, unspecified: Secondary | ICD-10-CM | POA: Diagnosis not present

## 2023-03-27 DIAGNOSIS — F02B3 Dementia in other diseases classified elsewhere, moderate, with mood disturbance: Secondary | ICD-10-CM | POA: Diagnosis not present

## 2023-03-27 DIAGNOSIS — F331 Major depressive disorder, recurrent, moderate: Secondary | ICD-10-CM | POA: Diagnosis not present

## 2023-03-31 ENCOUNTER — Encounter: Payer: Self-pay | Admitting: Podiatry

## 2023-03-31 DIAGNOSIS — F02B4 Dementia in other diseases classified elsewhere, moderate, with anxiety: Secondary | ICD-10-CM | POA: Diagnosis not present

## 2023-03-31 DIAGNOSIS — F02B3 Dementia in other diseases classified elsewhere, moderate, with mood disturbance: Secondary | ICD-10-CM | POA: Diagnosis not present

## 2023-03-31 DIAGNOSIS — Z48815 Encounter for surgical aftercare following surgery on the digestive system: Secondary | ICD-10-CM | POA: Diagnosis not present

## 2023-03-31 DIAGNOSIS — I480 Paroxysmal atrial fibrillation: Secondary | ICD-10-CM | POA: Diagnosis not present

## 2023-03-31 DIAGNOSIS — F02B18 Dementia in other diseases classified elsewhere, moderate, with other behavioral disturbance: Secondary | ICD-10-CM | POA: Diagnosis not present

## 2023-03-31 DIAGNOSIS — G4733 Obstructive sleep apnea (adult) (pediatric): Secondary | ICD-10-CM | POA: Diagnosis not present

## 2023-03-31 DIAGNOSIS — F5104 Psychophysiologic insomnia: Secondary | ICD-10-CM | POA: Diagnosis not present

## 2023-03-31 DIAGNOSIS — F331 Major depressive disorder, recurrent, moderate: Secondary | ICD-10-CM | POA: Diagnosis not present

## 2023-03-31 DIAGNOSIS — G309 Alzheimer's disease, unspecified: Secondary | ICD-10-CM | POA: Diagnosis not present

## 2023-04-02 DIAGNOSIS — G309 Alzheimer's disease, unspecified: Secondary | ICD-10-CM | POA: Diagnosis not present

## 2023-04-02 DIAGNOSIS — F331 Major depressive disorder, recurrent, moderate: Secondary | ICD-10-CM | POA: Diagnosis not present

## 2023-04-02 DIAGNOSIS — F02B18 Dementia in other diseases classified elsewhere, moderate, with other behavioral disturbance: Secondary | ICD-10-CM | POA: Diagnosis not present

## 2023-04-02 DIAGNOSIS — I480 Paroxysmal atrial fibrillation: Secondary | ICD-10-CM | POA: Diagnosis not present

## 2023-04-02 DIAGNOSIS — F02B4 Dementia in other diseases classified elsewhere, moderate, with anxiety: Secondary | ICD-10-CM | POA: Diagnosis not present

## 2023-04-02 DIAGNOSIS — Z48815 Encounter for surgical aftercare following surgery on the digestive system: Secondary | ICD-10-CM | POA: Diagnosis not present

## 2023-04-02 DIAGNOSIS — F02B3 Dementia in other diseases classified elsewhere, moderate, with mood disturbance: Secondary | ICD-10-CM | POA: Diagnosis not present

## 2023-04-02 DIAGNOSIS — F5104 Psychophysiologic insomnia: Secondary | ICD-10-CM | POA: Diagnosis not present

## 2023-04-02 DIAGNOSIS — G4733 Obstructive sleep apnea (adult) (pediatric): Secondary | ICD-10-CM | POA: Diagnosis not present

## 2023-04-03 ENCOUNTER — Other Ambulatory Visit: Payer: Self-pay | Admitting: Podiatry

## 2023-04-03 MED ORDER — SILVER SULFADIAZINE 1 % EX CREA: 1.0000 | TOPICAL_CREAM | Freq: Every day | CUTANEOUS | 0 refills | Status: DC

## 2023-04-03 NOTE — Progress Notes (Signed)
Patient messaged in, questioning if toe should look so angry/irritated at this point.  He had the entire nail removed and at the time of visit, it was infected and he was placed on oral antibiotics.  Sending in Rx silvadene cream for patient to put on toe instead of other antibiotic ointments.  Responding via MyChart.  F/U as scheduled with Dr. Annamary Rummage.

## 2023-04-08 ENCOUNTER — Ambulatory Visit (INDEPENDENT_AMBULATORY_CARE_PROVIDER_SITE_OTHER): Payer: Medicare PPO | Admitting: Podiatry

## 2023-04-08 DIAGNOSIS — L600 Ingrowing nail: Secondary | ICD-10-CM

## 2023-04-08 NOTE — Progress Notes (Signed)
Subjective: Jonathon Parker is a 72 y.o.  male returns to office today for follow up evaluation after having right Hallux total nail ingrown removal with phenol and alcohol matrixectomy approximately 2 weeks ago. Patient has been soaking using epsom salts and applying topical antibiotic covered with bandaid daily. Patient denies fevers, chills, nausea, vomiting. Denies any calf pain, chest pain, SOB.   Objective:  Vitals: Reviewed  General: Well developed, nourished, in no acute distress, alert and oriented x3   Dermatology: Skin is warm, dry and supple bilateral. right hallux nail bed appears to be clean, dry, with mild granular tissue and surrounding scab. There is no surrounding erythema, edema, drainage/purulence. The remaining nails appear unremarkable at this time. There are no other lesions or other signs of infection present.  Neurovascular status: Intact. No lower extremity swelling; No pain with calf compression bilateral.  Musculoskeletal: Decreased tenderness to palpation of the right hallux nail fold(s). Muscular strength within normal limits bilateral.   Assesement and Plan: S/p phenol and alcohol matrixectomy to the  right hallux nail total, doing well.   -Continue soaking in epsom salts twice a day followed by antibiotic ointment and a band-aid. Can leave uncovered at night. Continue this until completely healed.  -If the area has not healed in 2 weeks, call the office for follow-up appointment, or sooner if any problems arise.  -Monitor for any signs/symptoms of infection. Call the office immediately if any occur or go directly to the emergency room. Call with any questions/concerns.        Corinna Gab, DPM [facility] / [facility]                   04/08/2023

## 2023-04-11 DIAGNOSIS — F02B3 Dementia in other diseases classified elsewhere, moderate, with mood disturbance: Secondary | ICD-10-CM | POA: Diagnosis not present

## 2023-04-11 DIAGNOSIS — F02B18 Dementia in other diseases classified elsewhere, moderate, with other behavioral disturbance: Secondary | ICD-10-CM | POA: Diagnosis not present

## 2023-04-11 DIAGNOSIS — F5104 Psychophysiologic insomnia: Secondary | ICD-10-CM | POA: Diagnosis not present

## 2023-04-11 DIAGNOSIS — F331 Major depressive disorder, recurrent, moderate: Secondary | ICD-10-CM | POA: Diagnosis not present

## 2023-04-11 DIAGNOSIS — G4733 Obstructive sleep apnea (adult) (pediatric): Secondary | ICD-10-CM | POA: Diagnosis not present

## 2023-04-11 DIAGNOSIS — G309 Alzheimer's disease, unspecified: Secondary | ICD-10-CM | POA: Diagnosis not present

## 2023-04-11 DIAGNOSIS — I480 Paroxysmal atrial fibrillation: Secondary | ICD-10-CM | POA: Diagnosis not present

## 2023-04-11 DIAGNOSIS — F02B4 Dementia in other diseases classified elsewhere, moderate, with anxiety: Secondary | ICD-10-CM | POA: Diagnosis not present

## 2023-04-11 DIAGNOSIS — Z48815 Encounter for surgical aftercare following surgery on the digestive system: Secondary | ICD-10-CM | POA: Diagnosis not present

## 2023-04-14 DIAGNOSIS — I480 Paroxysmal atrial fibrillation: Secondary | ICD-10-CM | POA: Diagnosis not present

## 2023-04-14 DIAGNOSIS — G4733 Obstructive sleep apnea (adult) (pediatric): Secondary | ICD-10-CM | POA: Diagnosis not present

## 2023-04-14 DIAGNOSIS — F02B3 Dementia in other diseases classified elsewhere, moderate, with mood disturbance: Secondary | ICD-10-CM | POA: Diagnosis not present

## 2023-04-14 DIAGNOSIS — Z48815 Encounter for surgical aftercare following surgery on the digestive system: Secondary | ICD-10-CM | POA: Diagnosis not present

## 2023-04-14 DIAGNOSIS — G309 Alzheimer's disease, unspecified: Secondary | ICD-10-CM | POA: Diagnosis not present

## 2023-04-14 DIAGNOSIS — F331 Major depressive disorder, recurrent, moderate: Secondary | ICD-10-CM | POA: Diagnosis not present

## 2023-04-14 DIAGNOSIS — F5104 Psychophysiologic insomnia: Secondary | ICD-10-CM | POA: Diagnosis not present

## 2023-04-14 DIAGNOSIS — F02B4 Dementia in other diseases classified elsewhere, moderate, with anxiety: Secondary | ICD-10-CM | POA: Diagnosis not present

## 2023-04-14 DIAGNOSIS — F02B18 Dementia in other diseases classified elsewhere, moderate, with other behavioral disturbance: Secondary | ICD-10-CM | POA: Diagnosis not present

## 2023-04-19 DIAGNOSIS — J45909 Unspecified asthma, uncomplicated: Secondary | ICD-10-CM | POA: Diagnosis not present

## 2023-04-21 ENCOUNTER — Encounter: Payer: Self-pay | Admitting: Nurse Practitioner

## 2023-04-29 DIAGNOSIS — F329 Major depressive disorder, single episode, unspecified: Secondary | ICD-10-CM | POA: Diagnosis not present

## 2023-04-29 DIAGNOSIS — I10 Essential (primary) hypertension: Secondary | ICD-10-CM | POA: Diagnosis not present

## 2023-04-29 DIAGNOSIS — I495 Sick sinus syndrome: Secondary | ICD-10-CM | POA: Diagnosis not present

## 2023-04-29 DIAGNOSIS — I4891 Unspecified atrial fibrillation: Secondary | ICD-10-CM | POA: Diagnosis not present

## 2023-04-29 DIAGNOSIS — Z950 Presence of cardiac pacemaker: Secondary | ICD-10-CM | POA: Diagnosis not present

## 2023-04-29 DIAGNOSIS — J45909 Unspecified asthma, uncomplicated: Secondary | ICD-10-CM | POA: Diagnosis not present

## 2023-04-29 DIAGNOSIS — E785 Hyperlipidemia, unspecified: Secondary | ICD-10-CM | POA: Diagnosis not present

## 2023-05-05 ENCOUNTER — Other Ambulatory Visit: Payer: Self-pay | Admitting: Neurology

## 2023-05-14 ENCOUNTER — Ambulatory Visit (INDEPENDENT_AMBULATORY_CARE_PROVIDER_SITE_OTHER): Payer: Medicare PPO | Admitting: Nurse Practitioner

## 2023-05-14 ENCOUNTER — Encounter: Payer: Self-pay | Admitting: Nurse Practitioner

## 2023-05-14 VITALS — BP 92/64 | HR 84 | Ht 72.0 in | Wt 197.5 lb

## 2023-05-14 DIAGNOSIS — R194 Change in bowel habit: Secondary | ICD-10-CM

## 2023-05-14 DIAGNOSIS — K219 Gastro-esophageal reflux disease without esophagitis: Secondary | ICD-10-CM | POA: Diagnosis not present

## 2023-05-14 DIAGNOSIS — Z860100 Personal history of colon polyps, unspecified: Secondary | ICD-10-CM | POA: Diagnosis not present

## 2023-05-14 NOTE — Progress Notes (Addendum)
ASSESSMENT    Brief Narrative 72 y.o.  male known to Dr. Chales Abrahams. He has a remote history of colon cancer and recurrent colon polyps.  Polyps removed in 2021, now due for 3 year surveillance exam. 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 Rehospitalized the end of June 2024 due to syncope with associated AFIB with RVR  Remote colon cancer, s/p colon resection at age 32. History of multiple adenomatous colon polyps.  Due for 3 year surveillance colonoscopy in December 2024.   History of perforated jejunal diverticulitis s/p small bowel resection with primary anastomosis May 2024  History of cholelithiasis / choledocholithiasis status post ERCP with biliary sphincterotomy and stone extraction  followed by cholecystectomy in 2020  Chronically altered bowel habits. Sounds like he tends toward unformed stool in setting of bowel resection and cholecystectomy.  Seems to do okay on daily Colestipol but does get constipated a couple of times a month.   Chronic GERD Asymptomatic on daily pantoprazole.   History of perforated of jejunal diverticulitis in May 2024, s/p exp lap with small bowel resection   AFIB with RVR, on Eliquis  PLAN   --Hold Colestipol when constipated --Hold off on scheduling colonoscopy right now. No rectal bleeding or alarm features.  Need to make sure he is stable from Cardiac standpoint.  He is scheduled later this month for an echocardiogram and nuclear stress test. Additionally, he hasn't yet made it back to Pulmonary to discuss PFT results and endorses [facility] with minimal exertion.  --Will see him back in January to discuss colonoscopy.   HPI   Chief complaint : time for colonoscopy.   Jonathon Parker is due for 3 year surveillance colonoscopy. Since we say him  in 2021 he has had some major medical events. He is s/p PPM. He now has AFIb and is on Eliquis. He had a small bowel resection for perforated jejunal diverticulitis in May 2024.   Jonathon Parker feels tired. He gets [facility] with minimal activity. Was suppose to follow up with Pulmonolist after PFTs but was in hospital at time with small bowel perforation. For echo,  stress test and Cardiology follow up soon .   Altered bowel habits: No abdominal pain. No blood in stool. BMs are sporadic . 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 Jonathon Parker generally tended towards being unformed but with chemotherapy he developed diarrhea. Tried to see [place], couldn't get appt soon enough. Had a video visit with a [facility] who started Colestipol. He is no longer on chemotherapy but still takes a daily Colestipol. Has about once BM / day but can get " backed up" about twice a month during which time he doesn't stop the colestipol.   History of GERD He couldn't take while in the research study for CLL. Stopped it for 4 months and got GERD exacerbation . Oncology agreed to daily panotoprazole which he is taking with good control of symptoms   Previous GI Endoscopies / Labs / Imaging   **May not include all endoscopic evaluations   December 2021 surveillance colonoscopy  - Six 2 to 6 mm polyps in the proximal transverse colon, in the mid transverse colon, in the ascending colon and in the cecum, removed with a cold snare. Resected and retrieved. - Mild neo-sigmoid diverticulosis. - Patent end-to-end  colo-colonic anastomosis s/o low sigmoid resection, characterized by healthy appearing mucosa. - Non-bleeding internal hemorrhoids. - The examined portion of the ileum was normal. - The examination was otherwise normal on direct and retroflexion views  Surgical [P], colon, transverse, ascending, cecum, polyps (6) - TUBULAR ADENOMA (FIVE). - NO HIGH GRADE DYSPLASIA OR CARCINOMA. - POLYPOID COLONIC MUCOSA  (ONE).       Latest Ref Rng & Units 01/25/2023    2:52 AM 01/24/2023   11:14 AM 01/02/2023    5:26 AM  Hepatic Function  Total Protein 6.5 - 8.1 g/dL 4.5  5.5  5.7   Albumin 3.5 - 5.0 g/dL 2.8  3.4  2.9   AST 15 - 41 U/L 18  29  33   ALT 0 - 44 U/L 19  22  56   Alk Phosphatase 38 - 126 U/L 75  88  274   Total Bilirubin 0.3 - 1.2 mg/dL 0.8  1.2  0.8        Latest Ref Rng & Units 01/30/2023    8:02 AM 01/28/2023    7:37 AM 01/26/2023    7:04 AM  CBC  WBC 4.0 - 10.5 K/uL 3.9  2.5  3.0   Hemoglobin 13.0 - 17.0 g/dL 78.2  95.6  9.5   Hematocrit 39.0 - 52.0 % 32.8  31.6  29.7   Platelets 150 - 400 K/uL 136  119  123      Past Medical History:  Diagnosis Date   Allergy    Arthritis    not dx'd   Asthma    mild    Cholecystitis    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 Valentino Hue in [place] [facility]     Depression    GERD (gastroesophageal reflux disease)    controlled with nexium    Hypertension    Pacemaker     Past Surgical History:  Procedure Laterality Date   APPENDECTOMY     CARPAL TUNNEL RELEASE     left    CHOLECYSTECTOMY N/A 04/20/2019   Procedure: LAPAROSCOPIC CHOLECYSTECTOMY;  Surgeon: [name], MD;  Location: [facility] ORS;  Service: General;  Laterality: N/A;   COLON SURGERY  2002   10 inches of colon taken out    COLONOSCOPY     ENDOSCOPIC RETROGRADE CHOLANGIOPANCREATOGRAPHY (ERCP) WITH PROPOFOL N/A 03/01/2019   Procedure: ENDOSCOPIC RETROGRADE CHOLANGIOPANCREATOGRAPHY (ERCP) WITH PROPOFOL;  Surgeon: [name]., MD;  Location: [facility] ENDOSCOPY;  Service: Gastroenterology;  Laterality: N/A;   ERCP  03/01/2019   HERNIA REPAIR     bilateral inguinal    IR CHOLANGIOGRAM EXISTING TUBE  03/25/2019   IR PERC CHOLECYSTOSTOMY  03/02/2019   LAPAROTOMY N/A 12/18/2022   Procedure: EXPLORATORY LAPAROTOMY, BOWEL RESECTION;  Surgeon: [name], MD;  Location: [facility] OR;  Service: General;  Laterality: N/A;   left heel reconstruction   01/2002   17 screws and 2 plates    PACEMAKER INSERTION     POLYPECTOMY     REMOVAL OF STONES  03/01/2019   Procedure: REMOVAL OF STONES;  Surgeon: [name]., MD;  Location: [facility] ENDOSCOPY;  Service: Gastroenterology;;   [name]  03/01/2019   Procedure: [name];  Surgeon: [name]., MD;  Location: [facility] ENDOSCOPY;  Service: Gastroenterology;;   tooth extracted     with laughing gas    UPPER GASTROINTESTINAL ENDOSCOPY      Family History  Problem Relation Age of Onset   Prostate cancer Brother  Colon cancer Neg Hx    Esophageal cancer Neg Hx    Colon polyps Neg Hx    Rectal cancer Neg Hx    Stomach cancer Neg Hx     Current Medications, Allergies, Family History and Social History were reviewed in [employer] record.     Current Outpatient Medications  Medication Sig Dispense Refill   acetaminophen (TYLENOL) 500 MG tablet Take 1,000 mg by mouth every 6 (six) hours as needed for moderate pain or mild pain.     apixaban (ELIQUIS) 5 MG TABS tablet Take 5 mg by mouth 2 (two) times daily.     atorvastatin (LIPITOR) 20 MG tablet Take 20 mg by mouth daily.     buPROPion (WELLBUTRIN XL) 300 MG 24 hr tablet Take 1 tablet (300 mg total) by mouth daily. 30 tablet 0   cholecalciferol (VITAMIN D3) 25 MCG (1000 UNIT) tablet Take 2 tablets (2,000 Units total) by mouth daily. Takes 2 tablets (1,000 units each) for a total of 2,000 units daily (Patient taking differently: Take 2,000 Units by mouth daily.)     colestipol (COLESTID) 1 g tablet Take 1 g by mouth every evening.     docusate sodium (COLACE) 100 MG capsule Take 100 mg by mouth 2 (two) times daily.     donepezil (ARICEPT) 5 MG tablet TAKE ONE TABLET BY MOUTH EVERY DAY 30 tablet 0   metoprolol tartrate (LOPRESSOR) 25 MG tablet Take 0.5 tablets (12.5 mg total) by mouth 2 (two) times daily. (Patient taking differently: Take 25 mg by mouth 2 (two) times daily.)     ondansetron  (ZOFRAN) 8 MG tablet Take 8 mg by mouth every 8 (eight) hours as needed for nausea or vomiting.     pantoprazole (PROTONIX) 40 MG tablet Take 40 mg by mouth daily.     potassium chloride (KLOR-CON) 10 MEQ tablet Take 20 mEq by mouth daily.     sertraline (ZOLOFT) 50 MG tablet Take 50 mg by mouth at bedtime.     traZODone (DESYREL) 100 MG tablet Take 1 tablet (100 mg total) by mouth at bedtime. 10 tablet 0   No current facility-administered medications for this visit.    Review of Systems: Positive for swelling of feet / legs, shortness of breath, night sweats, confusion. No unexplained weight loss. No chest pain.  No urinary complaints.    Physical Exam  Wt Readings from Last 3 Encounters:  05/14/23 197 lb 8 oz (89.6 kg)  01/24/23 140 lb (63.5 kg)  01/03/23 193 lb 5.5 oz (87.7 kg)    BP 92/64   Pulse 84   Ht 6' (1.829 m)   Wt 197 lb 8 oz (89.6 kg)   BMI 26.79 kg/m  Constitutional:  Pleasant, generally well appearing male in no acute distress. Psychiatric: Normal mood and affect. Behavior is normal. EENT: Pupils normal.  Conjunctivae are normal. No scleral icterus. Neck supple.  Cardiovascular: Normal rate, regular rhythm.  Pulmonary/chest: Effort normal and breath sounds normal. No wheezing, rales or rhonchi. Abdominal: Soft, nondistended, nontender. Bowel sounds active throughout. There are no masses palpable. No hepatomegaly. Neurological: Alert and oriented to person place and time.   I spent 30 minutes total reviewing records, obtaining history, performing exam, counseling patient and documenting visit / findings.    Willette Cluster, NP  05/14/2023, 2:18 PM

## 2023-05-14 NOTE — Patient Instructions (Addendum)
_______________________________________________________  If your blood pressure at your visit was 140/90 or greater, please contact your primary care physician to follow up on this.  _______________________________________________________  If you are age 72 or older, your body mass index should be between 23-30. Your Body mass index is 26.79 kg/m. If this is out of the aforementioned range listed, please consider follow up with your Primary Care Provider.  If you are age 50 or younger, your body mass index should be between 19-25. Your Body mass index is 26.79 kg/m. If this is out of the aformentioned range listed, please consider follow up with your Primary Care Provider.   ________________________________________________________  The [facility] providers would like to encourage you to use [facility] to communicate with providers for non-urgent requests or questions.  Due to long hold times on the telephone, sending your provider a message by [facility] may be a faster and more efficient way to get a response.  Please allow 48 business hours for a response.  Please remember that this is for non-urgent requests.  _______________________________________________________  Hold Colestid if you become constipated  You have been scheduled for an appointment with Willette Cluster, NP on 08-28-2023 at 2pm . Please arrive 10 minutes early for your appointment.  It was a pleasure to see you today!  Thank you for trusting me with your gastrointestinal care!

## 2023-05-20 DIAGNOSIS — Z45018 Encounter for adjustment and management of other part of cardiac pacemaker: Secondary | ICD-10-CM | POA: Diagnosis not present

## 2023-05-20 NOTE — Progress Notes (Signed)
Agree with assessment/plan.  Raj Florestine Carmical, MD [facility] [phone]  

## 2023-05-29 DIAGNOSIS — I10 Essential (primary) hypertension: Secondary | ICD-10-CM | POA: Diagnosis not present

## 2023-05-29 DIAGNOSIS — I480 Paroxysmal atrial fibrillation: Secondary | ICD-10-CM | POA: Diagnosis not present

## 2023-05-29 DIAGNOSIS — Z9221 Personal history of antineoplastic chemotherapy: Secondary | ICD-10-CM | POA: Diagnosis not present

## 2023-05-29 DIAGNOSIS — I495 Sick sinus syndrome: Secondary | ICD-10-CM | POA: Diagnosis not present

## 2023-05-29 DIAGNOSIS — R55 Syncope and collapse: Secondary | ICD-10-CM | POA: Diagnosis not present

## 2023-05-29 DIAGNOSIS — Z950 Presence of cardiac pacemaker: Secondary | ICD-10-CM | POA: Diagnosis not present

## 2023-06-23 ENCOUNTER — Encounter: Payer: Self-pay | Admitting: Podiatry

## 2023-06-23 ENCOUNTER — Ambulatory Visit: Payer: Medicare PPO | Admitting: Podiatry

## 2023-06-23 DIAGNOSIS — B351 Tinea unguium: Secondary | ICD-10-CM

## 2023-06-23 DIAGNOSIS — M79674 Pain in right toe(s): Secondary | ICD-10-CM

## 2023-06-23 DIAGNOSIS — M79675 Pain in left toe(s): Secondary | ICD-10-CM

## 2023-06-23 DIAGNOSIS — G5792 Unspecified mononeuropathy of left lower limb: Secondary | ICD-10-CM

## 2023-06-23 NOTE — Progress Notes (Signed)
  Subjective:  Patient ID: Jonathon Parker, male    DOB: 07-Apr-1951,  MRN: [id number]  Chief Complaint  Patient presents with   RFC    Nail trim, previous nail avulsion of right hallux, healing well    72 y.o. male presents with the above complaint. History confirmed with patient. Patient presenting with pain related to dystrophic thickened elongated nails. Patient is unable to trim own nails related to nail dystrophy and/or mobility issues. Patient does not have a history of T2DM. No calluses.   Objective:  Physical Exam: warm, good capillary refill nail exam onychomycosis of the toenails, onycholysis, and dystrophic nails DP pulses palpable, PT pulses palpable, and protective sensation intact Left Foot:  Pain with palpation of nails due to elongation and dystrophic growth.  Right Foot: Pain with palpation of nails due to elongation and dystrophic growth.   Assessment:   1. Pain due to onychomycosis of toenails of both feet   2. Neuropathy of left foot     Plan:  Patient was evaluated and treated and all questions answered.   #Onychomycosis with pain  -Nails palliatively debrided as below. -Educated on self-care  Procedure: Nail Debridement Rationale: Pain Type of Debridement: manual, sharp debridement. Instrumentation: Nail nipper, rotary burr. Number of Nails: 10  No follow-ups on file.         Corinna Gab, DPM [facility] / [facility]

## 2023-07-09 DIAGNOSIS — I10 Essential (primary) hypertension: Secondary | ICD-10-CM | POA: Diagnosis not present

## 2023-07-09 DIAGNOSIS — Z950 Presence of cardiac pacemaker: Secondary | ICD-10-CM | POA: Diagnosis not present

## 2023-07-09 DIAGNOSIS — I351 Nonrheumatic aortic (valve) insufficiency: Secondary | ICD-10-CM | POA: Diagnosis not present

## 2023-07-09 DIAGNOSIS — Z7901 Long term (current) use of anticoagulants: Secondary | ICD-10-CM | POA: Diagnosis not present

## 2023-07-09 DIAGNOSIS — R0781 Pleurodynia: Secondary | ICD-10-CM | POA: Diagnosis not present

## 2023-07-09 DIAGNOSIS — S2232XA Fracture of one rib, left side, initial encounter for closed fracture: Secondary | ICD-10-CM | POA: Diagnosis not present

## 2023-07-09 DIAGNOSIS — I480 Paroxysmal atrial fibrillation: Secondary | ICD-10-CM | POA: Diagnosis not present

## 2023-07-09 DIAGNOSIS — R0789 Other chest pain: Secondary | ICD-10-CM | POA: Diagnosis not present

## 2023-07-24 DIAGNOSIS — J9811 Atelectasis: Secondary | ICD-10-CM | POA: Diagnosis not present

## 2023-07-24 DIAGNOSIS — K838 Other specified diseases of biliary tract: Secondary | ICD-10-CM | POA: Diagnosis not present

## 2023-07-24 DIAGNOSIS — C9110 Chronic lymphocytic leukemia of B-cell type not having achieved remission: Secondary | ICD-10-CM | POA: Diagnosis not present

## 2023-08-01 DIAGNOSIS — I10 Essential (primary) hypertension: Secondary | ICD-10-CM | POA: Diagnosis not present

## 2023-08-01 DIAGNOSIS — R21 Rash and other nonspecific skin eruption: Secondary | ICD-10-CM | POA: Diagnosis not present

## 2023-08-02 DIAGNOSIS — C9000 Multiple myeloma not having achieved remission: Secondary | ICD-10-CM | POA: Diagnosis not present

## 2023-08-25 DIAGNOSIS — Z45010 Encounter for checking and testing of cardiac pacemaker pulse generator [battery]: Secondary | ICD-10-CM | POA: Diagnosis not present

## 2023-08-28 ENCOUNTER — Encounter: Payer: Self-pay | Admitting: Nurse Practitioner

## 2023-08-28 ENCOUNTER — Ambulatory Visit: Payer: Medicare PPO | Admitting: Nurse Practitioner

## 2023-08-28 VITALS — BP 126/80 | HR 81 | Wt 195.5 lb

## 2023-08-28 DIAGNOSIS — R194 Change in bowel habit: Secondary | ICD-10-CM

## 2023-08-28 DIAGNOSIS — Z7901 Long term (current) use of anticoagulants: Secondary | ICD-10-CM | POA: Diagnosis not present

## 2023-08-28 DIAGNOSIS — Z860100 Personal history of colon polyps, unspecified: Secondary | ICD-10-CM

## 2023-08-28 MED ORDER — SUFLAVE 178.7 G PO SOLR: 1.0000 | Freq: Once | ORAL | 0 refills | Status: AC

## 2023-08-28 NOTE — Progress Notes (Signed)
ASSESSMENT    Brief Narrative:  73 y.o.  male known to Dr. 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 Multiple adenomatous colon polyps Dec 2021.  Due for 3 year surveillance colonoscopy. Didn't schedule this when patient was here in October due to recent history of syncope / Afib / some SHOB.  He has since been evaluated by Cardiologist / EP. Providers were aware of GI's concerns. Appreciate their clarification that patient was okay to proceed with colonoscopy from cardiac standpoint.   History of perforated jejunal diverticulitis s/p small bowel resection with primary anastomosis May 2024   Chronically altered bowel habits. Sounds like he tends toward unformed stool in setting of bowel resection and cholecystectomy.  Overall manages okay with as Colestid (holding for constipation and then taking stool softener if needed).    Chronic GERD Asymptomatic on daily pantoprazole.    History of perforated of jejunal diverticulitis in May 2024, s/p exp lap with small bowel resection    Chronic anticoagulation for AFIB with RVR, Takes Eliquis  CLL / Pancytopenia.  Counts are stable as of December labs ( in Care Everywhere).  Followed by [facility]. Complete treatment Nov 2023, now under surveillance.   See [facility] for any additional medical & surgical history   PLAN   --Continue Colestip 1 gram daily as needed but advised to stop it for 5 days prior to colonoscopy. Refills given  --Schedule for a surveillance colonoscopy. The risks and benefits of colonoscopy with possible polypectomy / biopsies were discussed and the patient agrees to proceed.  --Regarding Eliquis.  Patient understands the risk of holding Eliquis for  procedures.  He saw his Cardiologist Dr. Jeanett Schlein Ntim with [facility] on 07/09/23 and per that office note there are no cardiac contraindications to GI endoscopy/colonoscopy. May hold Eliquis anticoagulation 2 days prior to colonoscopy and resume postprocedure once deemed safe by gastroenterologist   HPI   Chief complaint : here to discuss colonoscopy   Brief GI history Patient was seen October 2020 for history of colon polyps / colon cancer.  He was due for his 3-year surveillance colonoscopy. Due to significant changes in his medical history since we saw him in 2021 and a hospitalization in June for syncope we decided to hold off on surveillance colonoscopy until he had Cardiology follow up. He was scheduled for echocardiogram and a nuclear stress test. He is here for follow up.   Interval History  Since last visit here Jonathon Parker has seen his Cardiologist and EP, records reviewed in Care Everywhere. Both providers were aware of GI's concerns about sedation / GI procedures. . Cardiolite stress test 05/29/2023 was normal.  Echocardiogram 05/29/2023 showed normal biventricular systolic function, mild aortic valve regurgitation.  The pacemaker is functioning optimally. Jonathon Parker has no complaints.  He would like a refill on colestipol.  For the most part his stools are unformed but can get constipated sometimes if takes colestipol on a regular basis.  When he gets constipated he holds colestipol and takes Colace.  No other complaints.   I was previously confused at prior visit. It sounds like he never did get PFTs done as ordered by pulmonologist a year ago. He can still gets short of breath when  moving about with this is chronic and unchanged.   GI History / Pertinent GI Studies   **All endoscopic studies may not be included here    December 2021 surveillance colonoscopy  - Six 2 to 6 mm polyps in the proximal transverse colon, in the mid transverse colon, in the ascending colon and in the cecum, removed  with a cold snare. Resected and retrieved. - Mild neo-sigmoid diverticulosis. - Patent end-to-end colo-colonic anastomosis s/o low sigmoid resection, characterized by healthy appearing mucosa. - Non-bleeding internal hemorrhoids. - The examined portion of the ileum was normal. - The examination was otherwise normal on direct and retroflexion views   Surgical [P], colon, transverse, ascending, cecum, polyps (6) - TUBULAR ADENOMA (FIVE). - NO HIGH GRADE DYSPLASIA OR CARCINOMA. - POLYPOID COLONIC MUCOSA (ONE).      Latest Ref Rng & Units 01/25/2023    2:52 AM 01/24/2023   11:14 AM 01/02/2023    5:26 AM  Hepatic Function  Total Protein 6.5 - 8.1 g/dL 4.5  5.5  5.7   Albumin 3.5 - 5.0 g/dL 2.8  3.4  2.9   AST 15 - 41 U/L 18  29  33   ALT 0 - 44 U/L 19  22  56   Alk Phosphatase 38 - 126 U/L 75  88  274   Total Bilirubin 0.3 - 1.2 mg/dL 0.8  1.2  0.8        Latest Ref Rng & Units 01/30/2023    8:02 AM 01/28/2023    7:37 AM 01/26/2023    7:04 AM  CBC  WBC 4.0 - 10.5 K/uL 3.9  2.5  3.0   Hemoglobin 13.0 - 17.0 g/dL 46.2  70.3  9.5   Hematocrit 39.0 - 52.0 % 32.8  31.6  29.7   Platelets 150 - 400 K/uL 136  119  123      Past Medical History:  Diagnosis Date   Allergy    Arthritis    not dx'd   Asthma    mild    Cholecystitis    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 Valentino Hue in [facility]     Depression    GERD (gastroesophageal reflux disease)    controlled with nexium    Hypertension    Pacemaker     Past Surgical History:  Procedure Laterality Date   APPENDECTOMY     CARPAL TUNNEL RELEASE     left    CHOLECYSTECTOMY N/A 04/20/2019   Procedure: LAPAROSCOPIC CHOLECYSTECTOMY;  Surgeon: [name], MD;  Location: [facility];  Service: General;  Laterality: N/A;   COLON SURGERY  2002   10 inches of colon taken out    COLONOSCOPY     ENDOSCOPIC RETROGRADE CHOLANGIOPANCREATOGRAPHY (ERCP) WITH PROPOFOL N/A 03/01/2019   Procedure: ENDOSCOPIC  RETROGRADE CHOLANGIOPANCREATOGRAPHY (ERCP) WITH PROPOFOL;  Surgeon: [name]., MD;  Location[facility];  Service: Gastroenterology;  Laterality: N/A;   ERCP  03/01/2019   HERNIA REPAIR     bilateral inguinal    IR CHOLANGIOGRAM EXISTING TUBE  03/25/2019   IR PERC CHOLECYSTOSTOMY  03/02/2019   LAPAROTOMY N/A 12/18/2022   Procedure: EXPLORATORY LAPAROTOMY, BOWEL RESECTION;  Surgeon: [name], MD;  Location: [facility] OR;  Service: General;  Laterality: N/A;   left heel reconstruction  01/2002   17 screws and 2 plates    PACEMAKER INSERTION     POLYPECTOMY     REMOVAL OF STONES  03/01/2019   Procedure:  REMOVAL OF STONES;  Surgeon: Mansouraty, Netty Starring., MD;  Location: [facility] ENDOSCOPY;  Service: Gastroenterology;;   [name]  03/01/2019   Procedure: [name];  Surgeon: Mansouraty, Netty Starring., MD;  Location: [facility] ENDOSCOPY;  Service: Gastroenterology;;   tooth extracted     with laughing gas    UPPER GASTROINTESTINAL ENDOSCOPY      Family History  Problem Relation Age of Onset   Prostate cancer Brother    Colon cancer Neg Hx    Esophageal cancer Neg Hx    Colon polyps Neg Hx    Rectal cancer Neg Hx    Stomach cancer Neg Hx     Current Medications, Allergies, Family History and Social History were reviewed in [employer] medical record.     Current Outpatient Medications  Medication Sig Dispense Refill   acetaminophen (TYLENOL) 500 MG tablet Take 1,000 mg by mouth every 6 (six) hours as needed for moderate pain or mild pain.     apixaban (ELIQUIS) 5 MG TABS tablet Take 5 mg by mouth 2 (two) times daily.     atorvastatin (LIPITOR) 20 MG tablet Take 20 mg by mouth daily.     buPROPion (WELLBUTRIN XL) 300 MG 24 hr tablet Take 1 tablet (300 mg total) by mouth daily. 30 tablet 0   cholecalciferol (VITAMIN D3) 25 MCG (1000 UNIT) tablet Take 2 tablets (2,000 Units total) by mouth daily. Takes 2 tablets (1,000 units each) for a total of 2,000 units  daily (Patient taking differently: Take 2,000 Units by mouth daily.)     colestipol (COLESTID) 1 g tablet Take 1 g by mouth every evening.     diphenoxylate-atropine (LOMOTIL) 2.5-0.025 MG tablet Take 1 tablet by mouth as needed for diarrhea or loose stools. Up to 4 tablets per day     docusate sodium (COLACE) 100 MG capsule Take 100 mg by mouth 2 (two) times daily.     donepezil (ARICEPT) 5 MG tablet TAKE ONE TABLET BY MOUTH EVERY DAY 30 tablet 0   levalbuterol (XOPENEX HFA) 45 MCG/ACT inhaler Inhale 1 puff into the lungs every 4 (four) hours as needed for wheezing.     metoprolol tartrate (LOPRESSOR) 25 MG tablet Take 25 mg by mouth 2 (two) times daily.     ondansetron (ZOFRAN) 8 MG tablet Take 8 mg by mouth every 8 (eight) hours as needed for nausea or vomiting.     pantoprazole (PROTONIX) 40 MG tablet Take 40 mg by mouth daily.     potassium chloride (KLOR-CON) 10 MEQ tablet Take 20 mEq by mouth daily.     sertraline (ZOLOFT) 50 MG tablet Take 50 mg by mouth at bedtime.     traZODone (DESYREL) 100 MG tablet Take 1 tablet (100 mg total) by mouth at bedtime. 10 tablet 0   No current facility-administered medications for this visit.    Review of Systems: No chest pain. No shortness of breath. No urinary complaints.    Physical Exam  There were no vitals filed for this visit. Wt Readings from Last 3 Encounters:  05/14/23 197 lb 8 oz (89.6 kg)  01/24/23 140 lb (63.5 kg)  01/03/23 193 lb 5.5 oz (87.7 kg)    BP 126/80 (BP Location: Left Arm, Patient Position: Sitting, Cuff Size: Normal)   Pulse 81   Wt 195 lb 8 oz (88.7 kg)   SpO2 95%   BMI 26.51 kg/m  Constitutional:  Pleasant, generally well appearing male in no acute distress. Psychiatric: Normal mood and affect.  Behavior is normal. EENT: Pupils normal.  Conjunctivae are normal. No scleral icterus. Neck supple.  Cardiovascular: Normal rate, regular rhythm.  Pulmonary/chest: Effort normal and breath sounds normal. No wheezing,  rales or rhonchi. Abdominal: Soft, nondistended, nontender. Bowel sounds active throughout. There are no masses palpable. No hepatomegaly. Neurological: Alert and oriented to person place and time.    Willette Cluster, NP  08/28/2023, 2:03 PM

## 2023-08-28 NOTE — Patient Instructions (Signed)
You will receive your bowel preparation through [facility], which ensures the lowest copay and home delivery, with outreach via text or call from an [phone] number. Please respond promptly to avoid rescheduling. If you are interested in alternative options or have any questions please contact them at [phone]  Your Provider Has Sent Your Bowel Prep Regimen To [facility] What to expect. [facility] will contact you to verify your information and collect your copay, if applicable. Enjoy the comfort of your home while we deliver your prescription to you, free of any shipping charges. Fast, FREE delivery or shipping. [facility] accepts all major insurance benefits and applies discounts & coupons  Have additional questions? [facility]'s patient care team is always here to help.  Chat: [email or web address] Call: [phone] Email: [email or web address] NCPDP: [id number] How will we contact you? Welcome Phone call  a Welcome text and a Checkout link in a text Texts you receive from [phone] Are Not Spam.   *To set up delivery, you must complete the checkout process via link or speak to one of our patient care representatives. If we are unable to reach you, your prescription may be delayed.   You have been scheduled for a colonoscopy. Please follow written instructions given to you at your visit today.   Please pick up your prep supplies at the pharmacy within the next 1-3 days.  If you use inhalers (even only as needed), please bring them with you on the day of your procedure.  DO NOT TAKE 7 DAYS PRIOR TO TEST- Trulicity (dulaglutide) Ozempic, Wegovy (semaglutide) Mounjaro (tirzepatide) Bydureon Bcise (exanatide extended release)  DO NOT TAKE 1 DAY PRIOR TO YOUR TEST Rybelsus (semaglutide) Adlyxin (lixisenatide) Victoza (liraglutide) Byetta (exanatide) ___________________________________________________________________________  Due to recent changes in healthcare laws, you  may see the results of your imaging and laboratory studies on MyChart before your provider has had a chance to review them.  We understand that in some cases there may be results that are confusing or concerning to you. Not all laboratory results come back in the same time frame and the provider may be waiting for multiple results in order to interpret others.  Please give [place] 48 hours in order for your provider to thoroughly review all the results before contacting the office for clarification of your results.   Thank you for choosing me and  Gastroenterology.  Willette Cluster, NP

## 2023-08-29 ENCOUNTER — Telehealth: Payer: Self-pay

## 2023-08-29 NOTE — Telephone Encounter (Signed)
Per Dr Chrissie Noa Ntim -   GI endoscopy: No cardiac contraindications. Okay to proceed from the cardiac standpoint with recommendation to hold Eliquis anticoagulation 2 days prior to procedure and resume postprocedure when deemed safe by gastroenterologist.   Patient was informed at visit 08/28/23 with Gunnar Fusi.

## 2023-08-31 NOTE — Progress Notes (Signed)
Agree with assessment/plan.  Edman Circle, MD [facility] [phone]

## 2023-09-01 ENCOUNTER — Other Ambulatory Visit: Payer: Self-pay | Admitting: Neurology

## 2023-09-01 ENCOUNTER — Encounter: Payer: Self-pay | Admitting: Podiatry

## 2023-09-01 ENCOUNTER — Ambulatory Visit: Payer: Medicare PPO | Admitting: Podiatry

## 2023-09-01 DIAGNOSIS — B351 Tinea unguium: Secondary | ICD-10-CM | POA: Diagnosis not present

## 2023-09-01 DIAGNOSIS — M79674 Pain in right toe(s): Secondary | ICD-10-CM | POA: Diagnosis not present

## 2023-09-01 DIAGNOSIS — M79675 Pain in left toe(s): Secondary | ICD-10-CM | POA: Diagnosis not present

## 2023-09-01 DIAGNOSIS — G5792 Unspecified mononeuropathy of left lower limb: Secondary | ICD-10-CM

## 2023-09-01 DIAGNOSIS — D689 Coagulation defect, unspecified: Secondary | ICD-10-CM

## 2023-09-01 NOTE — Progress Notes (Signed)
  Subjective:  Patient ID: Jonathon Parker, male    DOB: 11/23/1950,  MRN: [id number]  Chief Complaint  Patient presents with   RFC    RFC takes Eliquis.     73 y.o. male presents with the above complaint. History confirmed with patient. Patient presenting with pain related to dystrophic thickened elongated nails. Patient is unable to trim own nails related to nail dystrophy and/or mobility issues. Patient does not have a history of T2DM. No calluses.  Does take Eliquis for A-fib.  Objective:  Physical Exam: warm, good capillary refill nail exam onychomycosis of the toenails, onycholysis, and dystrophic nails DP pulses palpable, PT pulses palpable, and protective sensation intact Left Foot:  Pain with palpation of nails due to elongation and dystrophic growth.  Right Foot: Pain with palpation of nails due to elongation and dystrophic growth.   Assessment:   1. Pain due to onychomycosis of toenails of both feet   2. Neuropathy of left foot   3. Coagulation defect [facility])      Plan:  Patient was evaluated and treated and all questions answered.   #Onychomycosis with pain  -Nails palliatively debrided as below. -Educated on self-care -Is on chronic anticoagulation therapy for A-fib  Procedure: Nail Debridement Rationale: Pain Type of Debridement: manual, sharp debridement. Instrumentation: Nail nipper, rotary burr. Number of Nails: 10  Return in about 3 months (around 11/30/2023) for Routine Foot Care.         Bronwen Betters, DPM [facility] / [facility]

## 2023-09-04 ENCOUNTER — Other Ambulatory Visit: Payer: Self-pay | Admitting: Nurse Practitioner

## 2023-09-04 ENCOUNTER — Telehealth: Payer: Self-pay | Admitting: Neurology

## 2023-09-04 MED ORDER — DONEPEZIL HCL 5 MG PO TABS: 5.0000 mg | ORAL_TABLET | Freq: Every day | ORAL | 4 refills | Status: DC

## 2023-09-04 NOTE — Telephone Encounter (Signed)
Refill sent in for pt to last until his appointment

## 2023-09-04 NOTE — Telephone Encounter (Signed)
Patient needs a refill on his donezepil 5mg . Pt has an appt made for [facility] pharmacy

## 2023-09-15 DIAGNOSIS — F5104 Psychophysiologic insomnia: Secondary | ICD-10-CM | POA: Diagnosis not present

## 2023-09-15 DIAGNOSIS — Z125 Encounter for screening for malignant neoplasm of prostate: Secondary | ICD-10-CM | POA: Diagnosis not present

## 2023-09-15 DIAGNOSIS — Z23 Encounter for immunization: Secondary | ICD-10-CM | POA: Diagnosis not present

## 2023-09-15 DIAGNOSIS — E785 Hyperlipidemia, unspecified: Secondary | ICD-10-CM | POA: Diagnosis not present

## 2023-09-15 DIAGNOSIS — R413 Other amnesia: Secondary | ICD-10-CM | POA: Diagnosis not present

## 2023-09-15 DIAGNOSIS — K219 Gastro-esophageal reflux disease without esophagitis: Secondary | ICD-10-CM | POA: Diagnosis not present

## 2023-09-15 DIAGNOSIS — Z139 Encounter for screening, unspecified: Secondary | ICD-10-CM | POA: Diagnosis not present

## 2023-09-15 DIAGNOSIS — F331 Major depressive disorder, recurrent, moderate: Secondary | ICD-10-CM | POA: Diagnosis not present

## 2023-09-15 DIAGNOSIS — I10 Essential (primary) hypertension: Secondary | ICD-10-CM | POA: Diagnosis not present

## 2023-09-15 DIAGNOSIS — E559 Vitamin D deficiency, unspecified: Secondary | ICD-10-CM | POA: Diagnosis not present

## 2023-09-15 DIAGNOSIS — C9110 Chronic lymphocytic leukemia of B-cell type not having achieved remission: Secondary | ICD-10-CM | POA: Diagnosis not present

## 2023-09-29 DIAGNOSIS — J209 Acute bronchitis, unspecified: Secondary | ICD-10-CM | POA: Diagnosis not present

## 2023-09-29 DIAGNOSIS — J019 Acute sinusitis, unspecified: Secondary | ICD-10-CM | POA: Diagnosis not present

## 2023-09-29 DIAGNOSIS — R051 Acute cough: Secondary | ICD-10-CM | POA: Diagnosis not present

## 2023-09-29 DIAGNOSIS — R509 Fever, unspecified: Secondary | ICD-10-CM | POA: Diagnosis not present

## 2023-10-07 ENCOUNTER — Encounter: Payer: Self-pay | Admitting: Gastroenterology

## 2023-10-09 DIAGNOSIS — J208 Acute bronchitis due to other specified organisms: Secondary | ICD-10-CM | POA: Diagnosis not present

## 2023-10-09 DIAGNOSIS — R0982 Postnasal drip: Secondary | ICD-10-CM | POA: Diagnosis not present

## 2023-10-09 DIAGNOSIS — B9689 Other specified bacterial agents as the cause of diseases classified elsewhere: Secondary | ICD-10-CM | POA: Diagnosis not present

## 2023-10-09 DIAGNOSIS — R509 Fever, unspecified: Secondary | ICD-10-CM | POA: Diagnosis not present

## 2023-10-10 DIAGNOSIS — J209 Acute bronchitis, unspecified: Secondary | ICD-10-CM | POA: Diagnosis not present

## 2023-10-10 DIAGNOSIS — J208 Acute bronchitis due to other specified organisms: Secondary | ICD-10-CM | POA: Diagnosis not present

## 2023-10-13 ENCOUNTER — Telehealth: Payer: Self-pay | Admitting: Gastroenterology

## 2023-10-13 NOTE — Telephone Encounter (Signed)
 Inbound call from patient's wife, wishing to cancel patient's procedure for tomorrow, she states he is still sick and she rescheduled procedure for 5/1 at 3:00 PM.

## 2023-10-14 ENCOUNTER — Encounter: Payer: Medicare PPO | Admitting: Gastroenterology

## 2023-10-29 DIAGNOSIS — Z950 Presence of cardiac pacemaker: Secondary | ICD-10-CM | POA: Diagnosis not present

## 2023-10-29 DIAGNOSIS — I480 Paroxysmal atrial fibrillation: Secondary | ICD-10-CM | POA: Diagnosis not present

## 2023-10-29 DIAGNOSIS — Z7901 Long term (current) use of anticoagulants: Secondary | ICD-10-CM | POA: Diagnosis not present

## 2023-10-29 DIAGNOSIS — Z9221 Personal history of antineoplastic chemotherapy: Secondary | ICD-10-CM | POA: Diagnosis not present

## 2023-10-29 DIAGNOSIS — I351 Nonrheumatic aortic (valve) insufficiency: Secondary | ICD-10-CM | POA: Diagnosis not present

## 2023-11-04 DIAGNOSIS — E860 Dehydration: Secondary | ICD-10-CM | POA: Diagnosis not present

## 2023-11-04 DIAGNOSIS — L02414 Cutaneous abscess of left upper limb: Secondary | ICD-10-CM | POA: Diagnosis not present

## 2023-11-04 DIAGNOSIS — W07XXXA Fall from chair, initial encounter: Secondary | ICD-10-CM | POA: Diagnosis not present

## 2023-11-24 ENCOUNTER — Ambulatory Visit (AMBULATORY_SURGERY_CENTER)

## 2023-11-24 VITALS — Ht 72.0 in | Wt 195.0 lb

## 2023-11-24 DIAGNOSIS — R194 Change in bowel habit: Secondary | ICD-10-CM

## 2023-11-24 DIAGNOSIS — Z860100 Personal history of colon polyps, unspecified: Secondary | ICD-10-CM

## 2023-11-24 MED ORDER — NA SULFATE-K SULFATE-MG SULF 17.5-3.13-1.6 GM/177ML PO SOLN: 1.0000 | Freq: Once | ORAL | 0 refills | Status: AC

## 2023-11-24 NOTE — Progress Notes (Signed)
 No egg or soy allergy known to patient  No issues known to pt with past sedation with any surgeries or procedures Patient denies ever being told they had issues or difficulty with intubation  No FH of Malignant Hyperthermia Pt is not on diet pills Pt is not on  home 02  Pt is not on blood thinners  Pt denies issues with constipation Hx A fib or A flutter Have any cardiac testing pending-- No. Annual echo in September.  LOA: independent  Prep: suprep   Patient's chart reviewed by Rogena Class CNRA prior to previsit and patient appropriate for the [facility].  Previsit completed and red dot placed by patient's name on their procedure day (on provider's schedule).     PV completed with patient. Prep instructions sent via mychart and home address.

## 2023-11-27 DIAGNOSIS — R21 Rash and other nonspecific skin eruption: Secondary | ICD-10-CM | POA: Diagnosis not present

## 2023-12-01 ENCOUNTER — Ambulatory Visit: Payer: Medicare PPO | Admitting: Podiatry

## 2023-12-01 DIAGNOSIS — M79675 Pain in left toe(s): Secondary | ICD-10-CM | POA: Diagnosis not present

## 2023-12-01 DIAGNOSIS — D689 Coagulation defect, unspecified: Secondary | ICD-10-CM

## 2023-12-01 DIAGNOSIS — M79674 Pain in right toe(s): Secondary | ICD-10-CM | POA: Diagnosis not present

## 2023-12-01 DIAGNOSIS — G5792 Unspecified mononeuropathy of left lower limb: Secondary | ICD-10-CM

## 2023-12-01 DIAGNOSIS — B351 Tinea unguium: Secondary | ICD-10-CM

## 2023-12-01 DIAGNOSIS — Z45018 Encounter for adjustment and management of other part of cardiac pacemaker: Secondary | ICD-10-CM | POA: Diagnosis not present

## 2023-12-01 NOTE — Progress Notes (Unsigned)
  Subjective:  Patient ID: Jonathon Parker, male    DOB: 21-Jan-1951,  MRN: [id number]  Chief Complaint  Patient presents with   RFC    RFC Blood Thinner    73 y.o. male presents with the above complaint. History confirmed with patient. Patient presenting with pain related to dystrophic thickened elongated nails. Patient is unable to trim own nails related to nail dystrophy and/or mobility issues. Patient does not have a history of T2DM. No calluses.  Does take Eliquis  for A-fib.  Objective:  Physical Exam: warm, good capillary refill nail exam onychomycosis of the toenails, onycholysis, and dystrophic nails DP pulses palpable, PT pulses palpable, and protective sensation intact Left Foot:  Pain with palpation of nails due to elongation and dystrophic growth.  Right Foot: Pain with palpation of nails due to elongation and dystrophic growth.   Assessment:   1. Pain due to onychomycosis of toenails of both feet   2. Coagulation defect (HCC)   3. Neuropathy of left foot      Plan:  Patient was evaluated and treated and all questions answered.   #Onychomycosis with pain  -Nails palliatively debrided as below. -Educated on self-care -Is on chronic anticoagulation therapy for A-fib  Procedure: Nail Debridement Rationale: Pain Type of Debridement: manual, sharp debridement. Instrumentation: Nail nipper, rotary burr. Number of Nails: 10  Return in about 3 months (around 03/01/2024) for Routine Foot Care.         Eve Hinders, DPM [facility] / [facility]

## 2023-12-03 ENCOUNTER — Encounter: Payer: Self-pay | Admitting: Podiatry

## 2023-12-04 ENCOUNTER — Ambulatory Visit: Admitting: Gastroenterology

## 2023-12-04 ENCOUNTER — Encounter: Payer: Self-pay | Admitting: Gastroenterology

## 2023-12-04 VITALS — BP 95/68 | HR 60 | Temp 98.1°F | Resp 17 | Ht 72.0 in | Wt 195.0 lb

## 2023-12-04 DIAGNOSIS — D128 Benign neoplasm of rectum: Secondary | ICD-10-CM

## 2023-12-04 DIAGNOSIS — J45909 Unspecified asthma, uncomplicated: Secondary | ICD-10-CM | POA: Diagnosis not present

## 2023-12-04 DIAGNOSIS — F32A Depression, unspecified: Secondary | ICD-10-CM | POA: Diagnosis not present

## 2023-12-04 DIAGNOSIS — R194 Change in bowel habit: Secondary | ICD-10-CM | POA: Diagnosis not present

## 2023-12-04 DIAGNOSIS — D120 Benign neoplasm of cecum: Secondary | ICD-10-CM

## 2023-12-04 DIAGNOSIS — D123 Benign neoplasm of transverse colon: Secondary | ICD-10-CM | POA: Diagnosis not present

## 2023-12-04 DIAGNOSIS — I10 Essential (primary) hypertension: Secondary | ICD-10-CM | POA: Diagnosis not present

## 2023-12-04 DIAGNOSIS — K640 First degree hemorrhoids: Secondary | ICD-10-CM | POA: Diagnosis not present

## 2023-12-04 DIAGNOSIS — Z1211 Encounter for screening for malignant neoplasm of colon: Secondary | ICD-10-CM

## 2023-12-04 DIAGNOSIS — K5730 Diverticulosis of large intestine without perforation or abscess without bleeding: Secondary | ICD-10-CM

## 2023-12-04 DIAGNOSIS — Z860100 Personal history of colon polyps, unspecified: Secondary | ICD-10-CM | POA: Diagnosis not present

## 2023-12-04 MED ORDER — SODIUM CHLORIDE 0.9 % IV SOLN: 500.0000 mL | Freq: Once | INTRAVENOUS | Status: DC

## 2023-12-04 NOTE — Progress Notes (Unsigned)
 Called to room to assist during endoscopic procedure.  Patient ID and intended procedure confirmed with present staff. Received instructions for my participation in the procedure from the performing physician.

## 2023-12-04 NOTE — Patient Instructions (Addendum)
-  Handout on polyps, hemorrhoids and diverticulosis provided -await pathology results   -Continue present medications -Resume Eliquis  (apaxiban) at prior dose in 3 days (from May 4th)    YOU HAD AN ENDOSCOPIC PROCEDURE TODAY AT THE [facility]:   Refer to the procedure report that was given to you for any specific questions about what was found during the examination.  If the procedure report does not answer your questions, please call your gastroenterologist to clarify.  If you requested that your care partner not be given the details of your procedure findings, then the procedure report has been included in a sealed envelope for you to review at your convenience later.  YOU SHOULD EXPECT: Some feelings of bloating in the abdomen. Passage of more gas than usual.  Walking can help get rid of the air that was put into your GI tract during the procedure and reduce the bloating. If you had a lower endoscopy (such as a colonoscopy or flexible sigmoidoscopy) you may notice spotting of blood in your stool or on the toilet paper. If you underwent a bowel prep for your procedure, you may not have a normal bowel movement for a few days.  Please Note:  You might notice some irritation and congestion in your nose or some drainage.  This is from the oxygen used during your procedure.  There is no need for concern and it should clear up in a day or so.  SYMPTOMS TO REPORT IMMEDIATELY:  Following lower endoscopy (colonoscopy or flexible sigmoidoscopy):  Excessive amounts of blood in the stool  Significant tenderness or worsening of abdominal pains  Swelling of the abdomen that is new, acute  Fever of 100F or higher  For urgent or emergent issues, a gastroenterologist can be reached at any hour by calling [phone]. Do not use MyChart messaging for urgent concerns.    DIET:  We do recommend a small meal at first, but then you may proceed to your regular diet.  Drink plenty of fluids but you  should avoid alcoholic beverages for 24 hours.  ACTIVITY:  You should plan to take it easy for the rest of today and you should NOT DRIVE or use heavy machinery until tomorrow (because of the sedation medicines used during the test).    FOLLOW UP: Our staff will call the number listed on your records the next business day following your procedure.  We will call around 7:15- 8:00 am to check on you and address any questions or concerns that you may have regarding the information given to you following your procedure. If we do not reach you, we will leave a message.     If any biopsies were taken you will be contacted by phone or by letter within the next 1-3 weeks.  Please call us  at [phone] if you have not heard about the biopsies in 3 weeks.    SIGNATURES/CONFIDENTIALITY: You and/or your care partner have signed paperwork which will be entered into your electronic medical record.  These signatures attest to the fact that that the information above on your After Visit Summary has been reviewed and is understood.  Full responsibility of the confidentiality of this discharge information lies with you and/or your care-partner.

## 2023-12-04 NOTE — Op Note (Signed)
 [facility] Patient Name: Jonathon Parker Procedure Date: 12/04/2023 3:21 PM MRN: [id number] Endoscopist: Lajuan Pila , MD, [id number] Age: 73 Referring MD:  Date of Birth: 09/26/1950 Gender: Male Account #: [id number] Procedure:                Colonoscopy Indications:              High risk colon cancer surveillance: Personal                            history of colonic polyps Medicines:                Monitored Anesthesia Care Procedure:                Pre-Anesthesia Assessment:                           - Prior to the procedure, a History and Physical                            was performed, and patient medications and                            allergies were reviewed. The patient's tolerance of                            previous anesthesia was also reviewed. The risks                            and benefits of the procedure and the sedation                            options and risks were discussed with the patient.                            All questions were answered, and informed consent                            was obtained. Prior Anticoagulants: The patient has                            taken Eliquis  (apixaban ), last dose was 2 days                            prior to procedure. ASA Grade Assessment: III - A                            patient with severe systemic disease. After                            reviewing the risks and benefits, the patient was                            deemed in satisfactory condition to undergo the  procedure.                           After obtaining informed consent, the colonoscope                            was passed under direct vision. Throughout the                            procedure, the patient's blood pressure, pulse, and                            oxygen saturations were monitored continuously. The                            PCF-HQ190L Colonoscope [id number] was introduced                             through the anus and advanced to the the cecum,                            identified by appendiceal orifice and ileocecal                            valve. The colonoscopy was performed without                            difficulty. The patient tolerated the procedure                            well. The quality of the bowel preparation was                            adequate to identify polyps. The ileocecal valve,                            appendiceal orifice, and rectum were photographed. Scope In: 3:47:59 PM Scope Out: 4:09:08 PM Scope Withdrawal Time: 0 hours 16 minutes 6 seconds  Total Procedure Duration: 0 hours 21 minutes 9 seconds  Findings:                 Four sessile polyps were found in the proximal                            transverse colon, mid transverse colon(2) and                            cecum. The polyps were 4 to 6 mm in size. These                            polyps were removed with a cold snare. Resection                            and retrieval were complete.  A 6 mm polyp was found in the rectum. The polyp was                            sessile. The polyp was removed with a cold snare.                            Resection and retrieval were complete. Some oozing                            which stopped on its own.                           Many small-mouthed diverticula were found in the                            sigmoid colon.                           Non-bleeding internal hemorrhoids were found during                            retroflexion. The hemorrhoids were small and Grade                            I (internal hemorrhoids that do not prolapse).                           The exam was otherwise without abnormality on                            direct and retroflexion views. Complications:            No immediate complications. Scope trauma was noted                            at rectosigmoid. Estimated Blood Loss:      Estimated blood loss: none. Impression:               - Four 4 to 6 mm polyps in the proximal transverse                            colon, in the mid transverse colon and in the                            cecum, removed with a cold snare. Resected and                            retrieved.                           - One 6 mm polyp in the rectum, removed with a cold                            snare. Resected and retrieved.                           -  Mild sigmoid diverticulosis                           - Non-bleeding internal hemorrhoids.                           - The examination was otherwise normal on direct                            and retroflexion views. Recommendation:           - Patient has a contact number available for                            emergencies. The signs and symptoms of potential                            delayed complications were discussed with the                            patient. Return to normal activities tomorrow.                            Written discharge instructions were provided to the                            patient.                           - Resume previous diet.                           - Continue present medications.                           - Await pathology results.                           - Repeat colonoscopy is not recommended for                            surveillance.                           - Resume Eliquis  (apixaban ) at prior dose in 3 days                            (from 5/4).                           - The findings and recommendations were discussed                            with the patient's family. Lajuan Pila, MD 12/04/2023 4:19:58 PM This report has been signed electronically.

## 2023-12-04 NOTE — Progress Notes (Unsigned)
 Report to PACU, RN, vss, BBS= Clear.

## 2023-12-04 NOTE — Progress Notes (Unsigned)
 Pt's states no medical or surgical changes since previsit or office visit.

## 2023-12-04 NOTE — Progress Notes (Unsigned)
 [facility] History and Physical   Primary Care Physician:  Adrian Hopper, MD   Reason for Procedure:   H/O polyps  Plan:    Colon off eliquis      HPI: Jonathon Parker is a 73 y.o. 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 Payton Both in [facility] hospital     Depression    GERD (gastroesophageal reflux disease)    controlled with nexium     Hypertension    Pacemaker     Past Surgical History:  Procedure Laterality Date   APPENDECTOMY     CARPAL TUNNEL RELEASE     left    CHOLECYSTECTOMY N/A 04/20/2019   Procedure: LAPAROSCOPIC CHOLECYSTECTOMY;  Surgeon: Aldean Hummingbird, MD;  Location: [facility]S;  Service: General;  Laterality: N/A;   COLON SURGERY  2002   10 inches of colon taken out    COLONOSCOPY     ENDOSCOPIC RETROGRADE CHOLANGIOPANCREATOGRAPHY (ERCP) WITH PROPOFOL  N/A 03/01/2019   Procedure: ENDOSCOPIC RETROGRADE CHOLANGIOPANCREATOGRAPHY (ERCP) WITH PROPOFOL ;  Surgeon: Normie Becton., MD;  Location: [facility] ENDOSCOPY;  Service: Gastroenterology;  Laterality: N/A;   ERCP  03/01/2019   HERNIA REPAIR     bilateral inguinal    IR CHOLANGIOGRAM EXISTING TUBE  03/25/2019   IR PERC CHOLECYSTOSTOMY  03/02/2019   LAPAROTOMY N/A 12/18/2022   Procedure: EXPLORATORY LAPAROTOMY, BOWEL RESECTION;  Surgeon: Dareen Ebbing, MD;  Location: [facility] OR;  Service: General;  Laterality: N/A;   left heel reconstruction  01/2002   17 screws and 2 plates    PACEMAKER INSERTION     POLYPECTOMY     REMOVAL OF STONES  03/01/2019   Procedure: REMOVAL OF STONES;  Surgeon: Normie Becton., MD;  Location: [facility] ENDOSCOPY;  Service: Gastroenterology;;   [address]  03/01/2019   Procedure: [address];  Surgeon: Normie Becton., MD;  Location: [facility] ENDOSCOPY;  Service: Gastroenterology;;   tooth extracted     with  laughing gas    UPPER GASTROINTESTINAL ENDOSCOPY      Prior to Admission medications   Medication Sig Start Date End Date Taking? Authorizing Provider  atorvastatin  (LIPITOR) 20 MG tablet Take 20 mg by mouth daily. 03/22/21  Yes [provider]  buPROPion  (WELLBUTRIN  XL) 300 MG 24 hr tablet Take 1 tablet (300 mg total) by mouth daily. 11/26/20  Yes Laymond Priestly, NP  cholecalciferol (VITAMIN D3) 25 MCG (1000 UNIT) tablet Take 2 tablets (2,000 Units total) by mouth daily. Takes 2 tablets (1,000 units each) for a total of 2,000 units daily Patient taking differently: Take 2,000 Units by mouth daily. 11/26/20  Yes Laymond Priestly, NP  docusate sodium  (COLACE) 100 MG capsule Take 100 mg by mouth 2 (two) times daily.   Yes [provider]  donepezil  (ARICEPT ) 5 MG tablet Take 1 tablet (5 mg total) by mouth daily. 09/04/23  Yes Jhonny Moss, MD  doxycycline  (VIBRAMYCIN ) 100 MG capsule Take 100 mg by mouth 2 (two) times daily. 11/27/23  Yes [provider]  metoprolol  tartrate (LOPRESSOR ) 25 MG tablet Take 25 mg by mouth 2 (two) times daily.   Yes [provider]  pantoprazole  (PROTONIX ) 40 MG tablet Take 40 mg by mouth daily. 10/31/21  Yes [provider]  sertraline  (ZOLOFT ) 50 MG tablet  Take 50 mg by mouth at bedtime.   Yes [provider]  traZODone  (DESYREL ) 100 MG tablet Take 1 tablet (100 mg total) by mouth at bedtime. 11/26/20  Yes Laymond Priestly, NP  acetaminophen  (TYLENOL ) 500 MG tablet Take 1,000 mg by mouth every 6 (six) hours as needed for moderate pain or mild pain.    [provider]  apixaban  (ELIQUIS ) 5 MG TABS tablet Take 5 mg by mouth 2 (two) times daily. 11/01/22   [provider]  colestipol  (COLESTID ) 1 g tablet Take 1 tablet (1 g total) by mouth daily as needed. 09/04/23   Arlee Bellows, NP  diphenoxylate -atropine  (LOMOTIL ) 2.5-0.025 MG tablet Take 1 tablet by mouth as needed for diarrhea or loose stools. Up  to 4 tablets per day    [provider]  levalbuterol (XOPENEX HFA) 45 MCG/ACT inhaler Inhale 1 puff into the lungs every 4 (four) hours as needed for wheezing.    [provider]  ondansetron  (ZOFRAN ) 8 MG tablet Take 8 mg by mouth every 8 (eight) hours as needed for nausea or vomiting. 10/31/21   [provider]    Current Outpatient Medications  Medication Sig Dispense Refill   atorvastatin  (LIPITOR) 20 MG tablet Take 20 mg by mouth daily.     buPROPion  (WELLBUTRIN  XL) 300 MG 24 hr tablet Take 1 tablet (300 mg total) by mouth daily. 30 tablet 0   cholecalciferol (VITAMIN D3) 25 MCG (1000 UNIT) tablet Take 2 tablets (2,000 Units total) by mouth daily. Takes 2 tablets (1,000 units each) for a total of 2,000 units daily (Patient taking differently: Take 2,000 Units by mouth daily.)     docusate sodium  (COLACE) 100 MG capsule Take 100 mg by mouth 2 (two) times daily.     donepezil  (ARICEPT ) 5 MG tablet Take 1 tablet (5 mg total) by mouth daily. 30 tablet 4   doxycycline  (VIBRAMYCIN ) 100 MG capsule Take 100 mg by mouth 2 (two) times daily.     metoprolol  tartrate (LOPRESSOR ) 25 MG tablet Take 25 mg by mouth 2 (two) times daily.     pantoprazole  (PROTONIX ) 40 MG tablet Take 40 mg by mouth daily.     sertraline  (ZOLOFT ) 50 MG tablet Take 50 mg by mouth at bedtime.     traZODone  (DESYREL ) 100 MG tablet Take 1 tablet (100 mg total) by mouth at bedtime. 10 tablet 0   acetaminophen  (TYLENOL ) 500 MG tablet Take 1,000 mg by mouth every 6 (six) hours as needed for moderate pain or mild pain.     apixaban  (ELIQUIS ) 5 MG TABS tablet Take 5 mg by mouth 2 (two) times daily.     colestipol  (COLESTID ) 1 g tablet Take 1 tablet (1 g total) by mouth daily as needed. 90 tablet 1   diphenoxylate -atropine  (LOMOTIL ) 2.5-0.025 MG tablet Take 1 tablet by mouth as needed for diarrhea or loose stools. Up to 4 tablets per day     levalbuterol (XOPENEX HFA) 45 MCG/ACT inhaler Inhale 1 puff into the  lungs every 4 (four) hours as needed for wheezing.     ondansetron  (ZOFRAN ) 8 MG tablet Take 8 mg by mouth every 8 (eight) hours as needed for nausea or vomiting.     No current facility-administered medications for this visit.    Allergies as of 12/04/2023 - Review Complete 12/04/2023  Allergen Reaction Noted   Lisinopril  Cough 11/24/2020   Seroquel  [quetiapine ] Other (See Comments) 12/29/2022   Zolpidem Other (See Comments) 12/03/2022  Family History  Problem Relation Age of Onset   Prostate cancer Brother    Colon cancer Neg Hx    Esophageal cancer Neg Hx    Colon polyps Neg Hx    Rectal cancer Neg Hx    Stomach cancer Neg Hx     Social History   Socioeconomic History   Marital status: Married    Spouse name: Not on file   Number of children: Not on file   Years of education: Not on file   Highest education level: Not on file  Occupational History   Not on file  Tobacco Use   Smoking status: Never   Smokeless tobacco: Never  Vaping Use   Vaping status: Never Used  Substance and Sexual Activity   Alcohol use: Yes    Comment: 1 drink before supper   Drug use: No   Sexual activity: Yes  Other Topics Concern   Not on file  Social History Narrative   Right handed    Lives with wife   Drinks caffeine    Two story home   Social Drivers of Health   Financial Resource Strain: Low Risk  (08/25/2022)   Received from [facility]   Overall Financial Resource Strain (CARDIA)    Difficulty of Paying Living Expenses: Not very hard  Food Insecurity: No Food Insecurity (01/25/2023)   Hunger Vital Sign    Worried About Running Out of Food in the Last Year: Never true    Ran Out of Food in the Last Year: Never true  Transportation Needs: No Transportation Needs (01/25/2023)   PRAPARE - Administrator, Civil Service (Medical): No    Lack of Transportation (Non-Medical): No  Physical Activity: Inactive (08/25/2022)   Received from [facility]    Exercise Vital Sign    Days of Exercise per Week: 0 days    Minutes of Exercise per Session: 0 min  Stress: No Stress Concern Present (11/12/2022)   Received from [facility] - Occupational Stress Questionnaire    Feeling of Stress : Not at all  Social Connections: Somewhat Isolated (08/25/2022)   Received from [facility], [facility]   Social Network    How would you rate your social network (family, work, friends)?: Restricted participation with some degree of social isolation  Intimate Partner Violence: Not At Risk (01/25/2023)   Humiliation, Afraid, Rape, and Kick questionnaire    Fear of Current or Ex-Partner: No    Emotionally Abused: No    Physically Abused: No    Sexually Abused: No    Review of Systems: Positive for none All other review of systems negative except as mentioned in the HPI.  Physical Exam: Vital signs in last 24 hours: @VSRANGES @   General:   Alert,  Well-developed, well-nourished, pleasant and cooperative in NAD Lungs:  Clear throughout to auscultation.   Heart:  Regular rate and rhythm; no murmurs, clicks, rubs,  or gallops. Abdomen:  Soft, nontender and nondistended. Normal bowel sounds.   Neuro/Psych:  Alert and cooperative. Normal mood and affect. A and O x 3    No significant changes were identified.  The patient continues to be an appropriate candidate for the planned procedure and anesthesia.   Magnus Schuller, MD. [facility] 12/04/2023 4:21 PM@

## 2023-12-05 ENCOUNTER — Telehealth: Payer: Self-pay | Admitting: *Deleted

## 2023-12-05 NOTE — Telephone Encounter (Signed)
  Follow up Call-     12/04/2023    3:26 PM  Call back number  Post procedure Call Back phone  # [phone]  Permission to leave phone message Yes     Patient questions:  Message left to call if necessary.

## 2023-12-09 LAB — SURGICAL PATHOLOGY

## 2023-12-16 ENCOUNTER — Ambulatory Visit: Payer: Self-pay | Admitting: Gastroenterology

## 2023-12-17 ENCOUNTER — Encounter: Payer: Self-pay | Admitting: Neurology

## 2023-12-17 ENCOUNTER — Ambulatory Visit: Payer: Medicare PPO | Admitting: Neurology

## 2023-12-17 VITALS — BP 124/77 | HR 101 | Ht 72.0 in | Wt 204.0 lb

## 2023-12-17 DIAGNOSIS — F0670 Mild neurocognitive disorder due to known physiological condition without behavioral disturbance: Secondary | ICD-10-CM | POA: Diagnosis not present

## 2023-12-17 DIAGNOSIS — I999 Unspecified disorder of circulatory system: Secondary | ICD-10-CM

## 2023-12-17 MED ORDER — DONEPEZIL HCL 10 MG PO TABS: ORAL_TABLET | ORAL | 3 refills | Status: DC

## 2023-12-17 NOTE — Patient Instructions (Signed)
 Good to see you.  Increase Donepezil  to 10mg  every morning. With your current bottle of Donepezil  5mg : take 2 tablets every morning. Once done, your new bottle will be for Donepezil  10mg : take 1 tablet every morning  2. Schedule MRI brain without contrast  3. Please discuss increasing the Sertraline  with Dr. Ileana Mallard on your next visit  4. I recommend increasing physical activity, brain stimulation activities, look into programs at your senior center  5. Follow-up in 6 months, call for any changes   FALL PRECAUTIONS: Be cautious when walking. Scan the area for obstacles that may increase the risk of trips and falls. When getting up in the mornings, sit up at the edge of the bed for a few minutes before getting out of bed. Consider elevating the bed at the head end to avoid drop of blood pressure when getting up. Walk always in a well-lit room (use night lights in the walls). Avoid area rugs or power cords from appliances in the middle of the walkways. Use a walker or a cane if necessary and consider physical therapy for balance exercise. Get your eyesight checked regularly.  FINANCIAL OVERSIGHT: Supervision, especially oversight when making financial decisions or transactions is also recommended.  HOME SAFETY: Consider the safety of the kitchen when operating appliances like stoves, microwave oven, and blender. Consider having supervision and share cooking responsibilities until no longer able to participate in those. Accidents with firearms and other hazards in the house should be identified and addressed as well.  DRIVING: Regarding driving, in patients with progressive memory problems, driving will be impaired. We advise to have someone else do the driving if trouble finding directions or if minor accidents are reported. Independent driving assessment is available to determine safety of driving.  ABILITY TO BE LEFT ALONE: If patient is unable to contact 911 operator, consider using LifeLine,  or when the need is there, arrange for someone to stay with patients. Smoking is a fire hazard, consider supervision or cessation. Risk of wandering should be assessed by caregiver and if detected at any point, supervision and safe proof recommendations should be instituted.  MEDICATION SUPERVISION: Inability to self-administer medication needs to be constantly addressed. Implement a mechanism to ensure safe administration of the medications.  RECOMMENDATIONS FOR ALL PATIENTS WITH MEMORY PROBLEMS: 1. Continue to exercise (Recommend 30 minutes of walking everyday, or 3 hours every week) 2. Increase social interactions - continue going to [place] and enjoy social gatherings with friends and family 3. Eat healthy, avoid fried foods and eat more fruits and vegetables 4. Maintain adequate blood pressure, blood sugar, and blood cholesterol level. Reducing the risk of stroke and cardiovascular disease also helps promoting better memory. 5. Avoid stressful situations. Live a simple life and avoid aggravations. Organize your time and prepare for the next day in anticipation. 6. Sleep well, avoid any interruptions of sleep and avoid any distractions in the bedroom that may interfere with adequate sleep quality 7. Avoid sugar, avoid sweets as there is a strong link between excessive sugar intake, diabetes, and cognitive impairment The Mediterranean diet has been shown to help patients reduce the risk of progressive memory disorders and reduces cardiovascular risk. This includes eating fish, eat fruits and green leafy vegetables, nuts like almonds and hazelnuts, walnuts, and also use olive oil. Avoid fast foods and fried foods as much as possible. Avoid sweets and sugar as sugar use has been linked to worsening of memory function.  There is always a concern of gradual progression of  memory problems. If this is the case, then we may need to adjust level of care according to patient needs. Support, both to the  patient and caregiver, should then be put into place.

## 2023-12-17 NOTE — Progress Notes (Signed)
 NEUROLOGY FOLLOW UP OFFICE NOTE  Jonathon Parker [id number] [birth year]  HISTORY OF PRESENT ILLNESS: I had the pleasure of seeing Jonathon Parker in follow-up in the neurology clinic on 12/17/2023.  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 He is again accompanied by his wife who helps supplement the history today.  Records and images were personally reviewed where available.  He had several setbacks in his health over the past year. He was at his oncologist in [place] when he was noted to be in Afib with RVR in 10/2022. He had a pacemaker placed in 11/2022. He was admitted for enteritis in 12/2022 requiring surgical exploration. Hospital course complicated by persistent delirium. He was back in the hospital in 01/2023 for Afib in rvr, orthostatic hypotension with syncope. Head CT no acute changes, there was mild chronic microvascular disease, probable old lacunar infarct in right thalamus, age-advanced volume loss. EEG was normal. He went to inpatient rehab again and has been back home since 03/2023. Since coming home, cognition and overall general health have declined. He states "I don't remember much," his wife reports he answers "I don't know" to a lot of questions. He used to manage his own medications, his wife took over once he came home, he would be dropping pills, confused about what goes where and when. He has no concept of time, he would not be ready when it is time to go. He has driven a few times to the grocery or drug store without getting lost, his license expired last month and he has not driven since. His wife has always managed finances. He is independent with dressing and bathing, mostly needing help to get his jacket on. He is easily distracted and cannot make up his mind when they are in a restaurant. There are no hallucinations or paranoia, but his wife states "he thinks I'm mean." He admits his mood used to be better, he has no  motivation to do anything. He used to do cardiac rehab but stopped because "it was a waste of time." His wife reports he sits in his recliner all day. He is a lot more negative. He sleeps 8-10 hours. He has OSA but had not been able to start CPAP due to his hospitalizations. He snores and naps in the daytime. In the hospital, he would move in bed, ending up lying horizontally. Since he has been home, he has been moving in his sleep, but not clearly acting out dreams. He denies any significant headaches, dizziness, no change in left-sided numbness from the stroke in 2022. His gait is worse, he shuffles his feet with head leaned forward. Taking a shower wears him out. He has had several falls due to balance, last fall was a couple of months ago.   History on Initial Assessment 07/18/2020: This is a 73 year old right-handed man with a history of CLL, hypertension, depression, asthma, presenting for evaluation of memory loss.  He feels his memory is not as good as it used to be. He started noticing changes 2 years ago, however his wife has noticed changes for at least 5 years. She noticed he would get confused about what she is trying to tell him, saying she is not clear. He occasionally repeats himself. He could not recall if he took his medications so they got a pillbox last year which has helped. He manages his own medications. His wife manages finances. He denies getting lost  driving. He would forget what he went to get in a room. He misplaces things. He has occasional word-finding difficulties. His wife has noticed some personality changes so Zoloft  was added to Wellbutrin . He takes 1/2 tab Zoloft  due to dizziness on full tablet. No hallucinations. Sleep is good with Trazodone . He feels his mood is "sometimes good." His mother had Alzheimer's disease. No history of significant head injuries. He drinks 2 glasses of alcohol at night.   He has pain in his neck and shoulders. He has had some bowel issues since  colon surgery. He has had bilateral hand tremors for several years, affecting writing and when drinking from a glass. No family history of tremors. He denies any headaches, dizziness, diplopia, dysarthria, dysphagia, back pain, focal numbness/tingling/weakness, bladder dysfunction, anosmia, no falls. He is a retired [employer], he cannot do crafts anymore due to hand tremors.   Update 12/12/20: He presents after hospitalization for right thalamic stroke. He started having left arm numbness on 11/24/20. This progressed to left leg and face numbness, then when he got up he had left arm and leg weakness. He was dragging his left arm and leg but drove himself to his PCP office where EMS was called. He had been having issues controlling his BP and was going to his PCP every 2 weeks with different medication changes, SBP running between 160 and 200. In the ER, his NIHSS was 3, he was given TPA with some improvement in weakness. I personally reviewed MRI brain which showed an acute stroke in the lateral aspect of the right thalamus. There was moderate diffuse atrophy and mild chronic microvascular disease. CTA did not show any significant stenosis. TTE showed an EF of 55-60%, grade I diastolic dysfunction, left atrium mildly dilated. LDL was 87, he was started on atorvastatin  20mg  daily. HbA1c was 5.6. He was discharged home on aspirin  81mg  daily for secondary stroke prevention.   Diagnostic Data: MRI brain without contrast done 03/2020 reported advanced atrophy for age. No acute changes. Images unavailable for review. He was started on Donepezil  5mg  daily which he is tolerating without side effects.   11/2020: MRI brain which showed an acute stroke in the lateral aspect of the right thalamus. There was moderate diffuse atrophy and mild chronic microvascular disease. CTA did not show any significant stenosis.  TTE showed an EF of 55-60%, grade I diastolic dysfunction, left atrium mildly dilated. 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 He is on Donepezil  5mg  daily without side effects.    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 Payton Both in [facility]     Depression    GERD (gastroesophageal reflux disease)    controlled with nexium     Hypertension    Pacemaker     MEDICATIONS: Current Outpatient Medications on File Prior to Visit  Medication Sig Dispense Refill   acetaminophen  (TYLENOL ) 500 MG tablet Take 1,000 mg by mouth every 6 (six) hours as needed for moderate pain or mild pain.     apixaban  (ELIQUIS ) 5 MG TABS tablet Take 5 mg by mouth 2 (two) times daily.  atorvastatin  (LIPITOR) 20 MG tablet Take 20 mg by mouth daily.     buPROPion  (WELLBUTRIN  XL) 300 MG 24 hr tablet Take 1 tablet (300 mg total) by mouth daily. 30 tablet 0   cholecalciferol (VITAMIN D3) 25 MCG (1000 UNIT) tablet Take 2 tablets (2,000 Units total) by mouth daily. Takes 2 tablets (1,000 units each) for a total of 2,000 units daily (Patient taking differently: Take 2,000 Units by mouth daily.)     colestipol  (COLESTID ) 1 g tablet Take 1 tablet (1 g total) by mouth daily as needed. 90 tablet 1   diphenoxylate -atropine  (LOMOTIL ) 2.5-0.025 MG tablet Take 1 tablet by mouth as needed for diarrhea or loose stools. Up to 4 tablets per day     docusate sodium  (COLACE) 100 MG capsule Take 100 mg by mouth 2 (two) times daily.     donepezil  (ARICEPT ) 5 MG tablet Take 1 tablet (5 mg total) by mouth daily. 30 tablet 4   levalbuterol (XOPENEX HFA) 45 MCG/ACT inhaler Inhale 1 puff into the lungs every 4 (four) hours as needed for wheezing.     metoprolol  tartrate (LOPRESSOR ) 25  MG tablet Take 25 mg by mouth 2 (two) times daily.     ondansetron  (ZOFRAN ) 8 MG tablet Take 8 mg by mouth every 8 (eight) hours as needed for nausea or vomiting.     pantoprazole  (PROTONIX ) 40 MG tablet Take 40 mg by mouth daily.     sertraline  (ZOLOFT ) 50 MG tablet Take 50 mg by mouth at bedtime.     traZODone  (DESYREL ) 100 MG tablet Take 1 tablet (100 mg total) by mouth at bedtime. 10 tablet 0   No current facility-administered medications on file prior to visit.    ALLERGIES: Allergies  Allergen Reactions   Lisinopril  Cough   Seroquel  [Quetiapine ] Other (See Comments)    delirium   Zolpidem Other (See Comments)    "Got me in outer space"     FAMILY HISTORY: Family History  Problem Relation Age of Onset   Prostate cancer Brother    Colon cancer Neg Hx    Esophageal cancer Neg Hx    Colon polyps Neg Hx    Rectal cancer Neg Hx    Stomach cancer Neg Hx     SOCIAL HISTORY: Social History   Socioeconomic History   Marital status: Married    Spouse name: Not on file   Number of children: Not on file   Years of education: Not on file   Highest education level: Not on file  Occupational History   Not on file  Tobacco Use   Smoking status: Never   Smokeless tobacco: Never  Vaping Use   Vaping status: Never Used  Substance and Sexual Activity   Alcohol use: Yes    Comment: 1 drink before supper   Drug use: No   Sexual activity: Yes  Other Topics Concern   Not on file  Social History Narrative   Right handed    Lives with wife   Drinks caffeine    Two story home   Social Drivers of Health   Financial Resource Strain: Low Risk  (08/25/2022)   Received from [facility], [facility]   Overall Financial Resource Strain (CARDIA)    Difficulty of Paying Living Expenses: Not very hard  Food Insecurity: No Food Insecurity (01/25/2023)   Hunger Vital Sign    Worried About Running Out of Food in the Last Year: Never true    Ran Out of Food  in the Last Year: Never  true  Transportation Needs: No Transportation Needs (01/25/2023)   PRAPARE - Administrator, Civil Service (Medical): No    Lack of Transportation (Non-Medical): No  Physical Activity: Inactive (08/25/2022)   [facility]   Exercise Vital Sign    Days of Exercise per Week: 0 days    Minutes of Exercise per Session: 0 min  Stress: No Stress Concern Present (11/12/2022)   Received from [facility], [facility] - Occupational Stress Questionnaire    Feeling of Stress : Not at all  Social Connections: Somewhat Isolated (08/25/2022)   Received from [facility], [facility]   Social Network    How would you rate your social network (family, work, friends)?: Restricted participation with some degree of social isolation  Intimate Partner Violence: Not At Risk (01/25/2023)   Humiliation, Afraid, Rape, and Kick questionnaire    Fear of Current or Ex-Partner: No    Emotionally Abused: No    Physically Abused: No    Sexually Abused: No     PHYSICAL EXAM: Vitals:   12/17/23 0829  BP: 124/77  Pulse: (!) 101  SpO2: 97%   General: No acute distress Head:  Normocephalic/atraumatic Skin/Extremities: No rash, no edema Neurological Exam: alert and oriented to person, place, month/season. States year is 2024. No aphasia or dysarthria. Fund of knowledge is appropriate.  Recent and remote memory are intact.  Attention and concentration are normal. MMSE 26/30    12/17/2023    9:00 AM 12/07/2021    3:00 PM  MMSE - Mini Mental State Exam  Orientation to time 2 5  Orientation to Place 5 5  Registration 3 3  Attention/ Calculation 5 5  Recall 2 3  Language- name 2 objects 2 2  Language- repeat 1 1  Language- follow 3 step command 3 3  Language- read & follow direction 1 1  Write a sentence 1 1  Copy design 1 1  Total score 26 30   Cranial nerves: Pupils equal, round. Extraocular movements intact with no nystagmus. Visual  fields full.  No facial asymmetry.  Motor: Bulk and tone normal, no cogwheeling. Muscle strength 5/5 throughout with no pronator drift. Sensation intact to temperature.  Finger to nose testing intact.  Gait slow and cautious with head bent forward. 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 MMSE today 26/30. Etiology likely still vascular, we discussed repeating MRI brain without contrast to assess for underlying structural abnormality and vascular load (he has MRI compatible Azure XR DR MRI SureScan pacemaker). Increase Donepezil  to 10mg  daily, side effects discussed. We discussed apathy/lack of motivation, consider increasing Sertraline  with PCP. He was encouraged to increase physical exercise, brain stimulation exercises, social interaction (programs at [employer]), but he is not interested. Continue control of vascular risk factors, Eliquis , for secondary stroke prevention. Follow-up in 6 months, call for any changes.    Thank you for allowing me to participate in his care.  Please do not hesitate to call for any questions or concerns.    Rayfield Cairo, M.D.   CC: Dr. Ileana Mallard

## 2024-01-16 DIAGNOSIS — I480 Paroxysmal atrial fibrillation: Secondary | ICD-10-CM | POA: Diagnosis not present

## 2024-01-16 DIAGNOSIS — J208 Acute bronchitis due to other specified organisms: Secondary | ICD-10-CM | POA: Diagnosis not present

## 2024-01-16 DIAGNOSIS — B9689 Other specified bacterial agents as the cause of diseases classified elsewhere: Secondary | ICD-10-CM | POA: Diagnosis not present

## 2024-01-16 DIAGNOSIS — Z01818 Encounter for other preprocedural examination: Secondary | ICD-10-CM | POA: Diagnosis not present

## 2024-01-16 DIAGNOSIS — I10 Essential (primary) hypertension: Secondary | ICD-10-CM | POA: Diagnosis not present

## 2024-01-16 DIAGNOSIS — C9110 Chronic lymphocytic leukemia of B-cell type not having achieved remission: Secondary | ICD-10-CM | POA: Diagnosis not present

## 2024-01-16 DIAGNOSIS — K219 Gastro-esophageal reflux disease without esophagitis: Secondary | ICD-10-CM | POA: Diagnosis not present

## 2024-01-16 DIAGNOSIS — J4520 Mild intermittent asthma, uncomplicated: Secondary | ICD-10-CM | POA: Diagnosis not present

## 2024-01-23 DIAGNOSIS — J209 Acute bronchitis, unspecified: Secondary | ICD-10-CM | POA: Diagnosis not present

## 2024-01-23 DIAGNOSIS — J208 Acute bronchitis due to other specified organisms: Secondary | ICD-10-CM | POA: Diagnosis not present

## 2024-01-27 DIAGNOSIS — J208 Acute bronchitis due to other specified organisms: Secondary | ICD-10-CM | POA: Diagnosis not present

## 2024-03-01 ENCOUNTER — Encounter: Payer: Self-pay | Admitting: Podiatry

## 2024-03-01 ENCOUNTER — Ambulatory Visit: Admitting: Podiatry

## 2024-03-01 DIAGNOSIS — M79674 Pain in right toe(s): Secondary | ICD-10-CM | POA: Diagnosis not present

## 2024-03-01 DIAGNOSIS — B351 Tinea unguium: Secondary | ICD-10-CM

## 2024-03-01 DIAGNOSIS — M79675 Pain in left toe(s): Secondary | ICD-10-CM | POA: Diagnosis not present

## 2024-03-01 DIAGNOSIS — D689 Coagulation defect, unspecified: Secondary | ICD-10-CM

## 2024-03-01 NOTE — Progress Notes (Signed)
  Subjective:  Patient ID: Jonathon Parker, male    DOB: 10/04/1950,  MRN: [id number]  Chief Complaint  Patient presents with   RFC    RFC with out callous. Not diabetic, takes eliquis .     73 y.o. male presents with the above complaint. History confirmed with patient. Patient presenting with pain related to dystrophic thickened elongated nails. Patient is unable to trim own nails related to nail dystrophy and/or mobility issues. Patient does not have a history of T2DM. No calluses.  Does take Eliquis  for A-fib.  Objective:  Physical Exam: warm, good capillary refill nail exam onychomycosis of the toenails, onycholysis, and dystrophic nails DP pulses palpable, PT pulses palpable, and protective sensation intact Left Foot:  Pain with palpation of nails due to elongation and dystrophic growth.  Right Foot: Pain with palpation of nails due to elongation and dystrophic growth.   Assessment:   1. Pain due to onychomycosis of toenails of both feet   2. Coagulation defect [facility])      Plan:  Patient was evaluated and treated and all questions answered.   #Onychomycosis with pain  -Nails palliatively debrided as below. -Nail plates x 10 are painful on direct dorsal palpation -Educated on self-care -Is on chronic anticoagulation therapy for A-fib  Procedure: Nail Debridement Rationale: Pain Type of Debridement: manual, sharp debridement. Instrumentation: Nail nipper, rotary burr. Number of Nails: 10  Return in about 3 months (around 06/01/2024) for Routine Foot Care.         Ethan Saddler, DPM [facility] / [facility]

## 2024-03-02 DIAGNOSIS — L570 Actinic keratosis: Secondary | ICD-10-CM | POA: Diagnosis not present

## 2024-03-02 DIAGNOSIS — L820 Inflamed seborrheic keratosis: Secondary | ICD-10-CM | POA: Diagnosis not present

## 2024-03-02 DIAGNOSIS — L821 Other seborrheic keratosis: Secondary | ICD-10-CM | POA: Diagnosis not present

## 2024-03-02 DIAGNOSIS — Z08 Encounter for follow-up examination after completed treatment for malignant neoplasm: Secondary | ICD-10-CM | POA: Diagnosis not present

## 2024-03-02 DIAGNOSIS — Z85828 Personal history of other malignant neoplasm of skin: Secondary | ICD-10-CM | POA: Diagnosis not present

## 2024-03-02 DIAGNOSIS — B078 Other viral warts: Secondary | ICD-10-CM | POA: Diagnosis not present

## 2024-03-02 DIAGNOSIS — Z7189 Other specified counseling: Secondary | ICD-10-CM | POA: Diagnosis not present

## 2024-03-02 DIAGNOSIS — D225 Melanocytic nevi of trunk: Secondary | ICD-10-CM | POA: Diagnosis not present

## 2024-03-07 DIAGNOSIS — Z45018 Encounter for adjustment and management of other part of cardiac pacemaker: Secondary | ICD-10-CM | POA: Diagnosis not present

## 2024-03-15 DIAGNOSIS — F5104 Psychophysiologic insomnia: Secondary | ICD-10-CM | POA: Diagnosis not present

## 2024-03-15 DIAGNOSIS — I10 Essential (primary) hypertension: Secondary | ICD-10-CM | POA: Diagnosis not present

## 2024-03-15 DIAGNOSIS — K219 Gastro-esophageal reflux disease without esophagitis: Secondary | ICD-10-CM | POA: Diagnosis not present

## 2024-03-15 DIAGNOSIS — C9110 Chronic lymphocytic leukemia of B-cell type not having achieved remission: Secondary | ICD-10-CM | POA: Diagnosis not present

## 2024-03-15 DIAGNOSIS — R413 Other amnesia: Secondary | ICD-10-CM | POA: Diagnosis not present

## 2024-03-15 DIAGNOSIS — E785 Hyperlipidemia, unspecified: Secondary | ICD-10-CM | POA: Diagnosis not present

## 2024-03-15 DIAGNOSIS — E559 Vitamin D deficiency, unspecified: Secondary | ICD-10-CM | POA: Diagnosis not present

## 2024-03-15 DIAGNOSIS — I480 Paroxysmal atrial fibrillation: Secondary | ICD-10-CM | POA: Diagnosis not present

## 2024-03-15 DIAGNOSIS — F331 Major depressive disorder, recurrent, moderate: Secondary | ICD-10-CM | POA: Diagnosis not present

## 2024-03-30 ENCOUNTER — Other Ambulatory Visit: Payer: Self-pay

## 2024-03-30 ENCOUNTER — Emergency Department (HOSPITAL_COMMUNITY)
Admission: EM | Admit: 2024-03-30 | Discharge: 2024-03-30 | Disposition: A | Discharge status: Home / Self Care | Attending: Emergency Medicine | Admitting: Emergency Medicine

## 2024-03-30 ENCOUNTER — Encounter (HOSPITAL_COMMUNITY): Payer: Self-pay

## 2024-03-30 DIAGNOSIS — R0902 Hypoxemia: Secondary | ICD-10-CM | POA: Diagnosis not present

## 2024-03-30 DIAGNOSIS — R531 Weakness: Secondary | ICD-10-CM | POA: Insufficient documentation

## 2024-03-30 DIAGNOSIS — Z7901 Long term (current) use of anticoagulants: Secondary | ICD-10-CM | POA: Insufficient documentation

## 2024-03-30 DIAGNOSIS — R197 Diarrhea, unspecified: Secondary | ICD-10-CM | POA: Insufficient documentation

## 2024-03-30 DIAGNOSIS — R630 Anorexia: Secondary | ICD-10-CM | POA: Insufficient documentation

## 2024-03-30 DIAGNOSIS — I10 Essential (primary) hypertension: Secondary | ICD-10-CM | POA: Diagnosis not present

## 2024-03-30 DIAGNOSIS — Z950 Presence of cardiac pacemaker: Secondary | ICD-10-CM | POA: Insufficient documentation

## 2024-03-30 LAB — CBC WITH DIFFERENTIAL/PLATELET
Abs Immature Granulocytes: 0.02 K/uL (ref 0.00–0.07)
Basophils Absolute: 0 K/uL (ref 0.0–0.1)
Basophils Relative: 0 %
Eosinophils Absolute: 0 K/uL (ref 0.0–0.5)
Eosinophils Relative: 0 %
HCT: 40.3 % (ref 39.0–52.0)
Hemoglobin: 13 g/dL (ref 13.0–17.0)
Immature Granulocytes: 0 %
Lymphocytes Relative: 21 %
Lymphs Abs: 1.5 K/uL (ref 0.7–4.0)
MCH: 33.2 pg (ref 26.0–34.0)
MCHC: 32.3 g/dL (ref 30.0–36.0)
MCV: 103.1 fL — ABNORMAL HIGH (ref 80.0–100.0)
Monocytes Absolute: 0.5 K/uL (ref 0.1–1.0)
Monocytes Relative: 7 %
Neutro Abs: 4.9 K/uL (ref 1.7–7.7)
Neutrophils Relative %: 72 %
Platelets: 155 K/uL (ref 150–400)
RBC: 3.91 MIL/uL — ABNORMAL LOW (ref 4.22–5.81)
RDW: 13.2 % (ref 11.5–15.5)
WBC: 6.9 K/uL (ref 4.0–10.5)
nRBC: 0 % (ref 0.0–0.2)

## 2024-03-30 LAB — COMPREHENSIVE METABOLIC PANEL WITH GFR
ALT: 28 U/L (ref 0–44)
AST: 38 U/L (ref 15–41)
Albumin: 3.1 g/dL — ABNORMAL LOW (ref 3.5–5.0)
Alkaline Phosphatase: 97 U/L (ref 38–126)
Anion gap: 7 (ref 5–15)
BUN: 14 mg/dL (ref 8–23)
CO2: 24 mmol/L (ref 22–32)
Calcium: 8.7 mg/dL — ABNORMAL LOW (ref 8.9–10.3)
Chloride: 105 mmol/L (ref 98–111)
Creatinine, Ser: 1.1 mg/dL (ref 0.61–1.24)
GFR, Estimated: >60 mL/min (ref >60)
Glucose, Bld: 101 mg/dL — ABNORMAL HIGH (ref 70–99)
Potassium: 3.8 mmol/L (ref 3.5–5.1)
Sodium: 136 mmol/L (ref 135–145)
Total Bilirubin: 0.7 mg/dL (ref 0.0–1.2)
Total Protein: 5.4 g/dL — ABNORMAL LOW (ref 6.5–8.1)

## 2024-03-30 LAB — URINALYSIS, ROUTINE W REFLEX MICROSCOPIC
Bilirubin Urine: NEGATIVE
Glucose, UA: NEGATIVE mg/dL
Hgb urine dipstick: NEGATIVE
Ketones, ur: NEGATIVE mg/dL
Leukocytes,Ua: NEGATIVE
Nitrite: NEGATIVE
Protein, ur: NEGATIVE mg/dL
Specific Gravity, Urine: 1.018 (ref 1.005–1.030)
pH: 7 (ref 5.0–8.0)

## 2024-03-30 MED ORDER — METOPROLOL TARTRATE 25 MG PO TABS
25.0000 mg | ORAL_TABLET | ORAL | Status: AC
  Administered 2024-03-30: 25 mg via ORAL
  Filled 2024-03-30: qty 1

## 2024-03-30 MED ORDER — SODIUM CHLORIDE 0.9 % IV BOLUS
1000.0000 mL | Freq: Once | INTRAVENOUS | Status: AC
  Administered 2024-03-30: 1000 mL via INTRAVENOUS

## 2024-03-30 NOTE — ED Provider Notes (Signed)
 [facility] EMERGENCY DEPARTMENT AT [facility] Provider Note   CSN: [id number] Arrival date & time: 03/30/24  0221     Patient presents with: Diarrhea   Jonathon Parker is a 73 y.o. male.  Patient with past medical history significant for CLL, GERD, capylobacter diarrhea, pacemaker presents to the emergency department complaining of worsening weakness, diarrhea x 2 months, decreased appetite.  He states that he stooled himself this evening and that his buttocks feel raw.  He denies chest pain, shortness of breath, abdominal pain, nausea, vomiting.    Diarrhea      Prior to Admission medications   Medication Sig Start Date End Date Taking? Authorizing Provider  acetaminophen  (TYLENOL ) 500 MG tablet Take 1,000 mg by mouth every 6 (six) hours as needed for moderate pain or mild pain.    [provider]  apixaban  (ELIQUIS ) 5 MG TABS tablet Take 5 mg by mouth 2 (two) times daily. 11/01/22   [provider]  atorvastatin  (LIPITOR) 20 MG tablet Take 20 mg by mouth daily. 03/22/21   [provider]  buPROPion  (WELLBUTRIN  XL) 300 MG 24 hr tablet Take 1 tablet (300 mg total) by mouth daily. 11/26/20   Waddell Karna LABOR, NP  cholecalciferol (VITAMIN D3) 25 MCG (1000 UNIT) tablet Take 2 tablets (2,000 Units total) by mouth daily. Takes 2 tablets (1,000 units each) for a total of 2,000 units daily Patient taking differently: Take 2,000 Units by mouth daily. 11/26/20   Waddell Karna LABOR, NP  colestipol  (COLESTID ) 1 g tablet Take 1 tablet (1 g total) by mouth daily as needed. 09/04/23   Kerman Vina HERO, NP  diphenoxylate -atropine  (LOMOTIL ) 2.5-0.025 MG tablet Take 1 tablet by mouth as needed for diarrhea or loose stools. Up to 4 tablets per day    [provider]  docusate sodium  (COLACE) 100 MG capsule Take 100 mg by mouth 2 (two) times daily.    [provider]  donepezil  (ARICEPT ) 10 MG tablet Take 1 tablet every morning 12/17/23   Aquino, Karen  M, MD  levalbuterol (XOPENEX HFA) 45 MCG/ACT inhaler Inhale 1 puff into the lungs every 4 (four) hours as needed for wheezing.    [provider]  metoprolol  tartrate (LOPRESSOR ) 25 MG tablet Take 25 mg by mouth 2 (two) times daily.    [provider]  ondansetron  (ZOFRAN ) 8 MG tablet Take 8 mg by mouth every 8 (eight) hours as needed for nausea or vomiting. 10/31/21   [provider]  pantoprazole  (PROTONIX ) 40 MG tablet Take 40 mg by mouth daily. 10/31/21   [provider]  sertraline  (ZOLOFT ) 50 MG tablet Take 50 mg by mouth at bedtime.    [provider]  traZODone  (DESYREL ) 100 MG tablet Take 1 tablet (100 mg total) by mouth at bedtime. 11/26/20   Waddell Karna LABOR, NP    Allergies: Lisinopril , Seroquel  [name].Cons ], and Zolpidem    Review of Systems  Gastrointestinal:  Positive for diarrhea.    Updated Vital Signs BP 115/69   Pulse 60   Temp 98.6 F (37 C)   Resp 18   Ht 6' (1.829 m)   Wt 86.2 kg   SpO2 98%   BMI 25.77 kg/m   Physical Exam Vitals and nursing note reviewed.  Constitutional:      General: He is not in acute distress.    Appearance: He is well-developed.  HENT:     Head: Normocephalic and atraumatic.  Eyes:  Conjunctiva/sclera: Conjunctivae normal.  Cardiovascular:     Rate and Rhythm: Normal rate and regular rhythm.  Pulmonary:     Effort: Pulmonary effort is normal. No respiratory distress.     Breath sounds: Normal breath sounds.  Abdominal:     Palpations: Abdomen is soft.     Tenderness: There is no abdominal tenderness.  Musculoskeletal:        General: No swelling.     Cervical back: Neck supple.  Skin:    General: Skin is warm and dry.     Capillary Refill: Capillary refill takes less than 2 seconds.  Neurological:     Mental Status: He is alert.  Psychiatric:        Mood and Affect: Mood normal.     (all labs ordered are listed, but only abnormal results are displayed) Labs Reviewed   COMPREHENSIVE METABOLIC PANEL WITH GFR - Abnormal; Notable for the following components:      Result Value   Glucose, Bld 101 (*)    Calcium  8.7 (*)    Total Protein 5.4 (*)    Albumin  3.1 (*)    All other components within normal limits  CBC WITH DIFFERENTIAL/PLATELET - Abnormal; Notable for the following components:   RBC 3.91 (*)    MCV 103.1 (*)    All other components within normal limits  C DIFFICILE QUICK SCREEN W PCR REFLEX    URINALYSIS, ROUTINE W REFLEX MICROSCOPIC    EKG: None  Radiology: No results found.   Procedures   Medications Ordered in the ED - No data to display                                  Medical Decision Making Amount and/or Complexity of Data Reviewed Labs: ordered.   This patient presents to the ED for concern of diarrhea, this involves an extensive number of treatment options, and is a complaint that carries with it a high risk of complications and morbidity.  The differential diagnosis includes c-diff, functional diarrhea, IBS, others   Co morbidities / Chronic conditions that complicate the patient evaluation  CLL   Additional history obtained:  Additional history obtained from EMR External records from outside source obtained and reviewed including hematology/oncology progress notes   Lab Tests:  I Ordered, and personally interpreted labs.  The pertinent results include:  grossly unremarkable CMP   Imaging Studies ordered:  No abdominal tenderness on examination. No signs of surgical/acute abdomen   Test / Admission - Considered:  Patient with reported severe diarrhea and weakness. CBC and UA pending. Patient care transferred to [facility], PA-C at shift handoff.       Final diagnoses:  Diarrhea, unspecified type    ED Discharge Orders     None          Logan Ubaldo KATHEE DEVONNA 03/30/24 [id number]    Palumbo, April, MD 03/30/24 [phone]

## 2024-03-30 NOTE — Discharge Instructions (Addendum)
 You were evaluated in the emergency room for diarrhea.  Your lab work did not show any significant abnormality.  Please follow-up with your GI doctor for further evaluation.

## 2024-03-30 NOTE — ED Notes (Signed)
 CCMD called.

## 2024-03-30 NOTE — ED Provider Notes (Signed)
 Signout given from Ubaldo High, PA-C, in short patient is 73 year old male with history of CLL, GERD, Campylobacter diarrhea who presents with complaints of weakness and diarrhea x 2 months.  Has had poor p.o. intake.  Is without any chest pain, shortness of breath, abdominal pain nausea or vomiting.  His CBC and CMP is without significant abnormality.  At time of signout his UA and C. difficile is pending   Physical Exam  BP 120/81   Pulse 90   Temp 98.1 F (36.7 C) (Oral)   Resp 20   Ht 6' (1.829 m)   Wt 86.2 kg   SpO2 98%   BMI 25.77 kg/m   Physical Exam Vitals and nursing note reviewed.  Constitutional:      General: He is not in acute distress.    Appearance: He is well-developed.  HENT:     Head: Normocephalic and atraumatic.  Eyes:     Conjunctiva/sclera: Conjunctivae normal.  Cardiovascular:     Rate and Rhythm: Normal rate and regular rhythm.     Heart sounds: No murmur heard. Pulmonary:     Effort: Pulmonary effort is normal. No respiratory distress.     Breath sounds: Normal breath sounds.  Abdominal:     Palpations: Abdomen is soft.     Tenderness: There is no abdominal tenderness.  Musculoskeletal:        General: No swelling.     Cervical back: Neck supple.  Skin:    General: Skin is warm and dry.     Capillary Refill: Capillary refill takes less than 2 seconds.  Neurological:     Mental Status: He is alert.  Psychiatric:        Mood and Affect: Mood normal.     Procedures  Procedures  ED Course / MDM   Clinical Course as of 03/30/24 1149  Tue Mar 30, 2024  0824 Patient reevaluated.  He is hemodynamically stable.  His exam is benign.  Reviewed reassuring workup thus far with the patient and his wife.  He is unable to provide a stool sample.  I have a lower suspicion for C. difficile at this time.  Given his age and persistent symptoms, offered CT scan to evaluate for possible colitis.  Although he denies any hematochezia.  Patient declines this  at this time he is primarily interested in a liter bolus of fluids.  [JT]  1023 Patient noted to be tachycardic to 118.  EKG is consistent with A-fib.  He has a known history of proximal A-fib on Eliquis .  He is currently without any chest pain or shortness of breath.  Rate at this time ranges from 80-108.  Will continue to administer IV fluids and reevaluate. [JT]  1148 This patient given a dose of his home metoprolol .  He is able to ambulate without discomfort.  He feels comfortable going home at this point.  Will discharge home with strict return precautions and follow-up with GI.  Patient is agreement with plan.  Hemodynamically stable in normal sinus rhythm at this time. [JT]    Clinical Course User Index [JT] Donnajean Lynwood DEL, PA-C   Medical Decision Making Amount and/or Complexity of Data Reviewed Labs: ordered.  Risk Prescription drug management.   This patient presents to the ED with chief complaint(s) of diarrhea.  The complaint involves an extensive differential diagnosis and also carries with it a high risk of complications and morbidity.   Pertinent past medical history as listed in HPI  The differential diagnosis  includes  Low suspicion for C. difficile as patient is unable to give us  a stool culture.  Only has 1-2 bowel movements a day.  Has no risk factors either.  He has had no recent travel.  His abdomen is nontender.  Do not suspect acute surgical process. Additional history obtained: Additional history obtained from spouse Records reviewed Care Everywhere/External Records  Disposition:   Patient will be discharged home. The patient has been appropriately medically screened and/or stabilized in the ED. I have low suspicion for any other emergent medical condition which would require further screening, evaluation or treatment in the ED or require inpatient management. At time of discharge the patient is hemodynamically stable and in no acute distress. I have discussed  work-up results and diagnosis with patient and answered all questions. Patient is agreeable with discharge plan. We discussed strict return precautions for returning to the emergency department and they verbalized understanding.     Social Determinants of Health:   none  This note was dictated with voice recognition software.  Despite best efforts at proofreading, errors may have occurred which can change the documentation meaning.         Donnajean Lynwood DEL, PA-C 03/30/24 1149    Palumbo, April, MD 04/01/24 [phone]

## 2024-03-30 NOTE — ED Notes (Signed)
 Pt requested IV in LAC to be removed due to pain. No swelling and IV was flushing.. IV removed. This paramedic attempted second IV. Pt states he normally has US  guided.. order placed. Pt tolerates IV poorly, pulling his hand away and hollering.

## 2024-03-30 NOTE — ED Triage Notes (Signed)
 Diarrhea for the last week, pt has had poor oral intake and increased weakness. Pt was unable to ambulate on his own tonight

## 2024-04-27 DIAGNOSIS — Z9221 Personal history of antineoplastic chemotherapy: Secondary | ICD-10-CM | POA: Diagnosis not present

## 2024-04-27 DIAGNOSIS — Z950 Presence of cardiac pacemaker: Secondary | ICD-10-CM | POA: Diagnosis not present

## 2024-04-27 DIAGNOSIS — I351 Nonrheumatic aortic (valve) insufficiency: Secondary | ICD-10-CM | POA: Diagnosis not present

## 2024-04-27 DIAGNOSIS — I480 Paroxysmal atrial fibrillation: Secondary | ICD-10-CM | POA: Diagnosis not present

## 2024-04-28 DIAGNOSIS — C9110 Chronic lymphocytic leukemia of B-cell type not having achieved remission: Secondary | ICD-10-CM | POA: Diagnosis not present

## 2024-05-31 ENCOUNTER — Encounter: Payer: Self-pay | Admitting: Podiatry

## 2024-05-31 ENCOUNTER — Ambulatory Visit: Admitting: Podiatry

## 2024-05-31 DIAGNOSIS — B351 Tinea unguium: Secondary | ICD-10-CM

## 2024-05-31 DIAGNOSIS — M79674 Pain in right toe(s): Secondary | ICD-10-CM

## 2024-05-31 DIAGNOSIS — D689 Coagulation defect, unspecified: Secondary | ICD-10-CM

## 2024-05-31 DIAGNOSIS — M79675 Pain in left toe(s): Secondary | ICD-10-CM

## 2024-05-31 NOTE — Progress Notes (Signed)
  Subjective:  Patient ID: Jonathon Parker, male    DOB: 1951/07/16,  MRN: [id number]  Chief Complaint  Patient presents with   RFC    RFC, no callous, dry skin Not diabetic Eliquis .     73 y.o. male presents with the above complaint. History confirmed with patient. Patient presenting with pain related to dystrophic thickened elongated nails. Patient is unable to trim own nails related to nail dystrophy and/or mobility issues. Patient does not have a history of T2DM. No calluses.  Does take Eliquis  for A-fib.  Objective:  Physical Exam: warm, good capillary refill nail exam onychomycosis of the toenails, onycholysis, and dystrophic nails DP pulses palpable, PT pulses palpable, and protective sensation intact Left Foot:  Pain with palpation of nails due to elongation and dystrophic growth.  Right Foot: Pain with palpation of nails due to elongation and dystrophic growth.   Assessment:   1. Pain due to onychomycosis of toenails of both feet   2. Coagulation defect      Plan:  Patient was evaluated and treated and all questions answered.   #Onychomycosis with pain  -Nails palliatively debrided as below. -Educated on self-care - Anticoagulated on eliquis   Procedure: Nail Debridement Rationale: Pain Type of Debridement: manual, sharp debridement. Instrumentation: Nail nipper, rotary burr. Number of Nails: 10   Return in about 3 months (around 08/31/2024) for Routine Foot Care.         Ethan Saddler, DPM [facility] / [facility]

## 2024-06-10 DIAGNOSIS — I480 Paroxysmal atrial fibrillation: Secondary | ICD-10-CM | POA: Diagnosis not present

## 2024-06-10 DIAGNOSIS — I351 Nonrheumatic aortic (valve) insufficiency: Secondary | ICD-10-CM | POA: Diagnosis not present

## 2024-06-10 DIAGNOSIS — Z950 Presence of cardiac pacemaker: Secondary | ICD-10-CM | POA: Diagnosis not present

## 2024-06-10 DIAGNOSIS — E785 Hyperlipidemia, unspecified: Secondary | ICD-10-CM | POA: Diagnosis not present

## 2024-06-10 DIAGNOSIS — Z7901 Long term (current) use of anticoagulants: Secondary | ICD-10-CM | POA: Diagnosis not present

## 2024-06-10 DIAGNOSIS — Z9221 Personal history of antineoplastic chemotherapy: Secondary | ICD-10-CM | POA: Diagnosis not present

## 2024-07-27 ENCOUNTER — Encounter: Payer: Self-pay | Admitting: Neurology

## 2024-07-27 ENCOUNTER — Ambulatory Visit: Admitting: Neurology

## 2024-07-27 VITALS — BP 100/67 | HR 100 | Ht 72.0 in | Wt 213.2 lb

## 2024-07-27 DIAGNOSIS — I999 Unspecified disorder of circulatory system: Secondary | ICD-10-CM

## 2024-07-27 DIAGNOSIS — F0670 Mild neurocognitive disorder due to known physiological condition without behavioral disturbance: Secondary | ICD-10-CM | POA: Diagnosis not present

## 2024-07-27 MED ORDER — DONEPEZIL HCL 10 MG PO TABS: ORAL_TABLET | ORAL | 2 refills | Status: AC

## 2024-07-27 NOTE — Patient Instructions (Signed)
 It's good to see you!  Continue Donepezil  10mg  daily. Please confirm with your PCP about continuing refills  2. Continue Eliquis , control of BP, cholesterol, glucose levels   3. Follow-up as needed, call for any changes

## 2024-07-27 NOTE — Progress Notes (Signed)
 "  NEUROLOGY FOLLOW UP OFFICE NOTE  Jonathon Parker [id number] 1950/08/27  Discussed the use of AI scribe software for clinical note transcription with the patient, who gave verbal consent to proceed.  History of Present Illness I had the pleasure of seeing Jonathon Parker in follow-up in the neurology clinic on 07/27/2024. He is again accompanied by his wife who helps supplement the history today.   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 Records and images were personally reviewed where available.  On his last visit, they reported worsening memory after a series of hospitalizations in the past year. Donepezil  increased to 10mg  daily which he is tolerating without side effects. There was an ER visit for diarrhea, however they report this comes and goes and predates Donepezil . Memory overall stable, his wife feels it might be slightly worse. He is unable to remember much day-to-day information, and his wife confirms that he struggles with memory and requires assistance with his medications. He takes donepezil  (Aricept ) at night, and his wife manages his medications and finances. He has ceased driving since his license expired in April, and his wife sold his truck. he struggles with dressing and bathing, often becoming out of breath and requiring rest after activities such as showering. His energy levels are low, and he experiences joint pain, which limits his mobility. He experiences joint pain in all joints and has gained weight, which he attributes to decreased mobility. He sleeps well at night, no hallucinations, paranoia, or personality changes. He denies any headaches, significant dizziness, new focal symptoms. No falls, although his balance is not good.   History on Initial Assessment 07/18/2020: This is a 73 year old right-handed man with a history of CLL, hypertension, depression, asthma,  presenting for evaluation of memory loss.  He feels his memory is not as good as it used to be. He started noticing changes 2 years ago, however his wife has noticed changes for at least 5 years. She noticed he would get confused about what she is trying to tell him, saying she is not clear. He occasionally repeats himself. He could not recall if he took his medications so they got a pillbox last year which has helped. He manages his own medications. His wife manages finances. He denies getting lost driving. He would forget what he went to get in a room. He misplaces things. He has occasional word-finding difficulties. His wife has noticed some personality changes so Zoloft  was added to Wellbutrin . He takes 1/2 tab Zoloft  due to dizziness on full tablet. No hallucinations. Sleep is good with Trazodone . He feels his mood is sometimes good. His mother had Alzheimer's disease. No history of significant head injuries. He drinks 2 glasses of alcohol at night.   He has pain in his neck and shoulders. He has had some bowel issues since colon surgery. He has had bilateral hand tremors for several years, affecting writing and when drinking from a glass. No family history of tremors. He denies any headaches, dizziness, diplopia, dysarthria, dysphagia, back pain, focal numbness/tingling/weakness, bladder dysfunction, anosmia, no falls. 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   Update 12/12/20: He presents after hospitalization for right thalamic stroke. He started having left arm numbness on 11/24/20. This progressed to left leg and face numbness, then when he got up he had left arm and leg weakness. He was dragging his left  arm and leg but drove himself to his PCP office where EMS was called. He had been having issues controlling his BP and was going to his PCP every 2 weeks with different medication changes, SBP running between 160 and 200. In the ER, his NIHSS was 3, he was given  TPA with some improvement in weakness. I personally reviewed MRI brain which showed an acute stroke in the lateral aspect of the right thalamus. There was moderate diffuse atrophy and mild chronic microvascular disease. CTA did not show any significant stenosis. TTE showed an EF of 55-60%, grade I diastolic dysfunction, left atrium mildly dilated. LDL was 87, he was started on atorvastatin  20mg  daily. HbA1c was 5.6. He was discharged home on aspirin  81mg  daily for secondary stroke prevention.   Diagnostic Data: MRI brain without contrast done 03/2020 reported advanced atrophy for age. No acute changes. Images unavailable for review. He was started on Donepezil  5mg  daily which he is tolerating without side effects.   11/2020: MRI brain which showed an acute stroke in the lateral aspect of the right thalamus. There was moderate diffuse atrophy and mild chronic microvascular disease. CTA did not show any significant stenosis.  TTE showed an EF of 55-60%, grade I diastolic dysfunction, left atrium mildly dilated. 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 He is on Donepezil  5mg  daily without side effects.    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 Angelique in [facility]     Depression    GERD (gastroesophageal reflux disease)    controlled with nexium     Hypertension    Pacemaker     MEDICATIONS: Medications Ordered Prior to Encounter[1]  ALLERGIES: Allergies[2]  FAMILY HISTORY: Family History  Problem Relation Age of Onset   Prostate cancer Brother    Colon cancer Neg Hx    Esophageal cancer Neg Hx    Colon polyps Neg Hx    Rectal cancer  Neg Hx    Stomach cancer Neg Hx     SOCIAL HISTORY: Social History   Socioeconomic History   Marital status: Married    Spouse name: Not on file   Number of children: Not on file   Years of education: Not on file   Highest education level: Not on file  Occupational History   Not on file  Tobacco Use   Smoking status: Never   Smokeless tobacco: Never  Vaping Use   Vaping status: Never Used  Substance and Sexual Activity   Alcohol use: Yes    Comment: 1 drink before supper   Drug use: No   Sexual activity: Yes  Other Topics Concern   Not on file  Social History Narrative   Right handed    Lives with wife   Drinks caffeine    Two story home   Social Drivers of Health   Tobacco Use: Low Risk (07/27/2024)   Patient History    Smoking Tobacco Use: Never    Smokeless Tobacco Use: Never    Passive Exposure: Not on file  Financial Resource Strain: Low Risk (06/09/2024)   Received from [facility]   Overall Financial  Resource Strain (CARDIA)    How hard is it for you to pay for the very basics like food, housing, medical care, and heating?: Not hard at all  Food Insecurity: No Food Insecurity (06/09/2024)   Received from [facility]   Epic    Within the past 12 months, you worried that your food would run out before you got the money to buy more.: Never true    Within the past 12 months, the food you bought just didn't last and you didn't have money to get more.: Never true  Transportation Needs: No Transportation Needs (06/09/2024)   Received from [facility]    In the past 12 months, has lack of transportation kept you from medical appointments or from getting medications?: No    In the past 12 months, has lack of transportation kept you from meetings, work, or from getting things needed for daily living?: No  Physical Activity: Inactive (06/09/2024)   Received from [facility]   Exercise Vital Sign    On average, how many days per week do you engage in  moderate to strenuous exercise (like a brisk walk)?: 0 days    Minutes of Exercise per Session: Not on file  Stress: No Stress Concern Present (06/09/2024)   Received from [facility] of Occupational Health - Occupational Stress Questionnaire    Do you feel stress - tense, restless, nervous, or anxious, or unable to sleep at night because your mind is troubled all the time - these days?: Not at all  Social Connections: Somewhat Isolated (06/09/2024)   Received from [facility]   Social Network    How would you rate your social network (family, work, friends)?: Restricted participation with some degree of social isolation  Intimate Partner Violence: Not At Risk (06/09/2024)   Received from [facility]   HITS    Over the last 12 months how often did your partner physically hurt you?: Never    Over the last 12 months how often did your partner insult you or talk down to you?: Never    Over the last 12 months how often did your partner threaten you with physical harm?: Never    Over the last 12 months how often did your partner scream or curse at you?: Rarely  Depression (PHQ2-9): Not on file  Alcohol Screen: Not on file  Housing: Low Risk (06/09/2024)   Received from [facility]    In the last 12 months, was there a time when you were not able to pay the mortgage or rent on time?: No    In the past 12 months, how many times have you moved where you were living?: 0    At any time in the past 12 months, were you homeless or living in a shelter (including now)?: No  Utilities: Not At Risk (06/09/2024)   Received from [facility]    In the past 12 months has the electric, gas, oil, or water  company threatened to shut off services in your home?: No  Health Literacy: Not on file     PHYSICAL EXAM: Vitals:   07/27/24 1441  BP: 100/67  Pulse: 100  SpO2: 96%   General: No acute distress Head:  Normocephalic/atraumatic Skin/Extremities: No rash, no  edema Neurological Exam: alert and awake. No aphasia or dysarthria. Fund of knowledge is appropriate.  Recent and remote memory are intact, 3/3 delayed recall.  Attention and  concentration are normal, 5/5 WORLD backwards.   Cranial nerves: Pupils equal, round. Extraocular movements intact with no nystagmus. Visual fields full.  No facial asymmetry.  Motor: Bulk and tone normal, muscle strength 5/5 throughout with no pronator drift.   Finger to nose testing intact.  Gait slow and cautious, no ataxia. No resting tremor, +bilateral postural and endpoint tremor.    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 MMSE 26/30 in 12/2023. Continue Donepezil  10mg  daily. Continue Eliquis , control of vascular risk factors for secondary stroke prevention. He asks about need for continued follow-up in our office, we agreed that since symptoms have been stable, he can potentially have Donepezil  refills through PCP if he wishes. Follow-up as needed, call for any changes.   Thank you for allowing me to participate in his care.  Please do not hesitate to call for any questions or concerns.    Darice Shivers, M.D.   CC: Dr. Keren     [1]  Current Outpatient Medications on File Prior to Visit  Medication Sig Dispense Refill   acetaminophen  (TYLENOL ) 500 MG tablet Take 1,000 mg by mouth every 6 (six) hours as needed for moderate pain or mild pain.     apixaban  (ELIQUIS ) 5 MG TABS tablet Take 5 mg by mouth 2 (two) times daily.     atorvastatin  (LIPITOR) 20 MG tablet Take 20 mg by mouth daily.     buPROPion  (WELLBUTRIN  XL) 300 MG 24 hr tablet Take 1 tablet (300 mg total) by mouth daily. 30 tablet 0   cholecalciferol (VITAMIN D3) 25 MCG (1000 UNIT) tablet Take 2 tablets  (2,000 Units total) by mouth daily. Takes 2 tablets (1,000 units each) for a total of 2,000 units daily (Patient taking differently: Take 2,000 Units by mouth daily.)     diphenoxylate -atropine  (LOMOTIL ) 2.5-0.025 MG tablet Take 1 tablet by mouth as needed for diarrhea or loose stools. Up to 4 tablets per day     docusate sodium  (COLACE) 100 MG capsule Take 100 mg by mouth 2 (two) times daily.     donepezil  (ARICEPT ) 10 MG tablet Take 1 tablet every morning (Patient taking differently: Take 10 mg by mouth at bedtime. Take 1 tablet every morning) 90 tablet 3   metoprolol  tartrate (LOPRESSOR ) 25 MG tablet Take 25 mg by mouth 2 (two) times daily.     ondansetron  (ZOFRAN ) 8 MG tablet Take 8 mg by mouth every 8 (eight) hours as needed for nausea or vomiting.     pantoprazole  (PROTONIX ) 40 MG tablet Take 40 mg by mouth daily.     sertraline  (ZOLOFT ) 50 MG tablet Take 50 mg by mouth at bedtime.     traZODone  (DESYREL ) 100 MG tablet Take 1 tablet (100 mg total) by mouth at bedtime. 10 tablet 0   levalbuterol (XOPENEX HFA) 45 MCG/ACT inhaler Inhale 1 puff into the lungs every 4 (four) hours as needed for wheezing. (Patient not taking: Reported on 07/27/2024)     No current facility-administered medications on file prior to visit.  [2]  Allergies Allergen Reactions   Lisinopril  Cough   Seroquel  [Quetiapine ] Other (See Comments)    delirium   Zolpidem Other (See Comments)    Got me in outer space    "

## 2024-07-30 ENCOUNTER — Encounter: Payer: Self-pay | Admitting: Neurology

## 2024-08-30 ENCOUNTER — Ambulatory Visit: Admitting: Podiatry

## 2024-09-01 ENCOUNTER — Ambulatory Visit: Admitting: Podiatry

## 2024-09-01 DIAGNOSIS — M79674 Pain in right toe(s): Secondary | ICD-10-CM

## 2024-09-01 DIAGNOSIS — M79675 Pain in left toe(s): Secondary | ICD-10-CM

## 2024-09-01 DIAGNOSIS — B351 Tinea unguium: Secondary | ICD-10-CM | POA: Diagnosis not present

## 2024-09-01 NOTE — Progress Notes (Signed)
 "     Subjective:  Patient ID: Jonathon Parker, male    DOB: Apr 18, 1951,  MRN: [id number]  Jonathon Parker presents to clinic today for:  Chief Complaint  Patient presents with   [facility]    RFC with no callus, but does have very bad dry skin Not diabetic and takes eliquis    Patient notes nails are thick, discolored, elongated and painful in shoegear when trying to ambulate.  He noted his toes are very sensitive to trimming  PCP is Keren Vicenta BRAVO, MD.  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 Angelique in [facility] hospital     Depression    GERD (gastroesophageal reflux disease)    controlled with nexium     Hypertension    Pacemaker    Past Surgical History:  Procedure Laterality Date   APPENDECTOMY     CARPAL TUNNEL RELEASE     left    CHOLECYSTECTOMY N/A 04/20/2019   Procedure: LAPAROSCOPIC CHOLECYSTECTOMY;  Surgeon: Tanda Locus, MD;  Location: [facility] ORS;  Service: General;  Laterality: N/A;   COLON SURGERY  2002   10 inches of colon taken out    COLONOSCOPY     ENDOSCOPIC RETROGRADE CHOLANGIOPANCREATOGRAPHY (ERCP) WITH PROPOFOL  N/A 03/01/2019   Procedure: ENDOSCOPIC RETROGRADE CHOLANGIOPANCREATOGRAPHY (ERCP) WITH PROPOFOL ;  Surgeon: Wilhelmenia Aloha Raddle., MD;  Location: [facility] ENDOSCOPY;  Service: Gastroenterology;  Laterality: N/A;   ERCP  03/01/2019   HERNIA REPAIR     bilateral inguinal    IR CHOLANGIOGRAM EXISTING TUBE  03/25/2019   IR PERC CHOLECYSTOSTOMY  03/02/2019   LAPAROTOMY N/A 12/18/2022   Procedure: EXPLORATORY LAPAROTOMY, BOWEL RESECTION;  Surgeon: Belinda Cough, MD;  Location: [facility] OR;  Service: General;  Laterality: N/A;   left heel reconstruction  01/2002   17 screws and 2 plates    PACEMAKER INSERTION     POLYPECTOMY     REMOVAL OF STONES  03/01/2019   Procedure: REMOVAL OF STONES;  Surgeon: Wilhelmenia Aloha Raddle., MD;  Location: [facility] ENDOSCOPY;  Service: Gastroenterology;;   ANNETT  03/01/2019   Procedure: ANNETT;  Surgeon: Wilhelmenia Aloha Raddle., MD;  Location: [facility] ENDOSCOPY;  Service: Gastroenterology;;   tooth extracted     with laughing gas    UPPER GASTROINTESTINAL ENDOSCOPY     Allergies[1]  Review of Systems: Negative except as noted in the HPI.  Objective:  Jonathon Parker is a pleasant 74 y.o. male in NAD. AAO x 3.  Vascular Examination: Capillary refill time is 3-5 seconds to toes bilateral. Palpable pedal pulses b/l LE. Digital hair present b/l.  Skin temperature gradient WNL b/l. No varicosities b/l. No cyanosis noted b/l.   Dermatological Examination: Pedal skin with normal turgor, texture and tone b/l. No open wounds. No interdigital macerations b/l. Toenails x10 are 3mm thick, discolored, dystrophic with subungual debris. There is pain with compression of the nail plates.  They are elongated x10  Assessment/Plan: 1. Pain due to onychomycosis of toenails of both feet    The mycotic toenails were sharply debrided x10 with sterile nail nippers and a power debriding burr to decrease bulk/thickness and length.    Asked the patient if he was okay if we apply an anesthetic gel to the toenails at his next appointment to provide some more comfort for  him during nail debridements.  He agreed as this should make him more comfortable.  Return in about 3 months (around 11/30/2024) for RFC (numbing gel on toenails and 3-WEA).   Jonathon Parker, DPM, FACFAS [facility]     [address] [place], [place] [address]                Office [phone]  Fax [phone]    [1]  Allergies Allergen Reactions   Lisinopril  Cough   Seroquel  [Quetiapine ] Other (See Comments)    delirium   Zolpidem Other (See Comments)    Got me in outer space    "

## 2024-12-01 ENCOUNTER — Ambulatory Visit: Admitting: Podiatry

## 2024-12-10 ENCOUNTER — Ambulatory Visit: Admitting: Podiatry
